# Patient Record
Sex: Female | Born: 1955
Health system: Southern US, Community
[De-identification: ages and names within clinical notes are randomized; demographics above are authoritative.]

## PROBLEM LIST (undated history)

## (undated) DIAGNOSIS — I213 ST elevation (STEMI) myocardial infarction of unspecified site: Secondary | ICD-10-CM

## (undated) DIAGNOSIS — Z720 Tobacco use: Secondary | ICD-10-CM

## (undated) DIAGNOSIS — C189 Malignant neoplasm of colon, unspecified: Secondary | ICD-10-CM

## (undated) DIAGNOSIS — J449 Chronic obstructive pulmonary disease, unspecified: Secondary | ICD-10-CM

## (undated) DIAGNOSIS — I5022 Chronic systolic (congestive) heart failure: Secondary | ICD-10-CM

## (undated) DIAGNOSIS — J9621 Acute and chronic respiratory failure with hypoxia: Secondary | ICD-10-CM

## (undated) DIAGNOSIS — I255 Ischemic cardiomyopathy: Secondary | ICD-10-CM

## (undated) DIAGNOSIS — I509 Heart failure, unspecified: Secondary | ICD-10-CM

## (undated) DIAGNOSIS — E785 Hyperlipidemia, unspecified: Secondary | ICD-10-CM

## (undated) DIAGNOSIS — I10 Essential (primary) hypertension: Secondary | ICD-10-CM

## (undated) DIAGNOSIS — J9501 Hemorrhage from tracheostomy stoma: Secondary | ICD-10-CM

## (undated) DIAGNOSIS — I2510 Atherosclerotic heart disease of native coronary artery without angina pectoris: Secondary | ICD-10-CM

## (undated) DIAGNOSIS — G629 Polyneuropathy, unspecified: Secondary | ICD-10-CM

## (undated) DIAGNOSIS — C3490 Malignant neoplasm of unspecified part of unspecified bronchus or lung: Secondary | ICD-10-CM

## (undated) HISTORY — PX: ABDOMINAL SURGERY: SHX537

## (undated) HISTORY — PX: COLOSTOMY: SHX63

---
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
## 2018-12-12 ENCOUNTER — Inpatient Hospital Stay (HOSPITAL_COMMUNITY): Payer: Medicare HMO

## 2018-12-12 ENCOUNTER — Encounter (HOSPITAL_COMMUNITY): Payer: Self-pay | Admitting: Physician Assistant

## 2018-12-12 ENCOUNTER — Ambulatory Visit (HOSPITAL_COMMUNITY): Admit: 2018-12-12 | Payer: Medicare HMO | Admitting: Interventional Cardiology

## 2018-12-12 ENCOUNTER — Emergency Department (HOSPITAL_COMMUNITY): Payer: Medicare HMO | Admitting: Anesthesiology

## 2018-12-12 ENCOUNTER — Encounter (HOSPITAL_COMMUNITY)
Admission: EM | Disposition: A | Discharge status: Home Health | Payer: Self-pay | Source: Home / Self Care | Attending: Interventional Cardiology

## 2018-12-12 ENCOUNTER — Encounter (HOSPITAL_COMMUNITY): Payer: Self-pay | Admitting: Certified Registered Nurse Anesthetist

## 2018-12-12 ENCOUNTER — Inpatient Hospital Stay (HOSPITAL_COMMUNITY)
Admission: EM | Admit: 2018-12-12 | Discharge: 2018-12-20 | DRG: 270 | Disposition: A | Discharge status: Home Health | Payer: Medicare HMO | Attending: Interventional Cardiology | Admitting: Interventional Cardiology

## 2018-12-12 ENCOUNTER — Emergency Department (HOSPITAL_COMMUNITY): Payer: Medicare HMO

## 2018-12-12 DIAGNOSIS — J810 Acute pulmonary edema: Secondary | ICD-10-CM | POA: Diagnosis not present

## 2018-12-12 DIAGNOSIS — I5041 Acute combined systolic (congestive) and diastolic (congestive) heart failure: Secondary | ICD-10-CM

## 2018-12-12 DIAGNOSIS — Y95 Nosocomial condition: Secondary | ICD-10-CM | POA: Diagnosis not present

## 2018-12-12 DIAGNOSIS — I219 Acute myocardial infarction, unspecified: Secondary | ICD-10-CM | POA: Diagnosis not present

## 2018-12-12 DIAGNOSIS — I2510 Atherosclerotic heart disease of native coronary artery without angina pectoris: Secondary | ICD-10-CM

## 2018-12-12 DIAGNOSIS — E872 Acidosis: Secondary | ICD-10-CM | POA: Diagnosis not present

## 2018-12-12 DIAGNOSIS — Z902 Acquired absence of lung [part of]: Secondary | ICD-10-CM

## 2018-12-12 DIAGNOSIS — J9621 Acute and chronic respiratory failure with hypoxia: Secondary | ICD-10-CM | POA: Diagnosis not present

## 2018-12-12 DIAGNOSIS — E785 Hyperlipidemia, unspecified: Secondary | ICD-10-CM | POA: Diagnosis present

## 2018-12-12 DIAGNOSIS — I2129 ST elevation (STEMI) myocardial infarction involving other sites: Secondary | ICD-10-CM | POA: Diagnosis present

## 2018-12-12 DIAGNOSIS — Z85118 Personal history of other malignant neoplasm of bronchus and lung: Secondary | ICD-10-CM

## 2018-12-12 DIAGNOSIS — I472 Ventricular tachycardia: Secondary | ICD-10-CM | POA: Diagnosis not present

## 2018-12-12 DIAGNOSIS — I25110 Atherosclerotic heart disease of native coronary artery with unstable angina pectoris: Secondary | ICD-10-CM | POA: Diagnosis present

## 2018-12-12 DIAGNOSIS — Z20828 Contact with and (suspected) exposure to other viral communicable diseases: Secondary | ICD-10-CM | POA: Diagnosis present

## 2018-12-12 DIAGNOSIS — J9622 Acute and chronic respiratory failure with hypercapnia: Secondary | ICD-10-CM | POA: Diagnosis not present

## 2018-12-12 DIAGNOSIS — E782 Mixed hyperlipidemia: Secondary | ICD-10-CM | POA: Diagnosis not present

## 2018-12-12 DIAGNOSIS — Z85048 Personal history of other malignant neoplasm of rectum, rectosigmoid junction, and anus: Secondary | ICD-10-CM | POA: Diagnosis not present

## 2018-12-12 DIAGNOSIS — Z720 Tobacco use: Secondary | ICD-10-CM

## 2018-12-12 DIAGNOSIS — R7981 Abnormal blood-gas level: Secondary | ICD-10-CM

## 2018-12-12 DIAGNOSIS — Z79899 Other long term (current) drug therapy: Secondary | ICD-10-CM

## 2018-12-12 DIAGNOSIS — R0689 Other abnormalities of breathing: Secondary | ICD-10-CM | POA: Diagnosis not present

## 2018-12-12 DIAGNOSIS — I214 Non-ST elevation (NSTEMI) myocardial infarction: Secondary | ICD-10-CM | POA: Diagnosis not present

## 2018-12-12 DIAGNOSIS — F17210 Nicotine dependence, cigarettes, uncomplicated: Secondary | ICD-10-CM | POA: Diagnosis present

## 2018-12-12 DIAGNOSIS — I213 ST elevation (STEMI) myocardial infarction of unspecified site: Secondary | ICD-10-CM | POA: Diagnosis present

## 2018-12-12 DIAGNOSIS — E876 Hypokalemia: Secondary | ICD-10-CM | POA: Diagnosis present

## 2018-12-12 DIAGNOSIS — J690 Pneumonitis due to inhalation of food and vomit: Secondary | ICD-10-CM | POA: Diagnosis present

## 2018-12-12 DIAGNOSIS — Z955 Presence of coronary angioplasty implant and graft: Secondary | ICD-10-CM

## 2018-12-12 DIAGNOSIS — Z4659 Encounter for fitting and adjustment of other gastrointestinal appliance and device: Secondary | ICD-10-CM

## 2018-12-12 DIAGNOSIS — J9600 Acute respiratory failure, unspecified whether with hypoxia or hypercapnia: Secondary | ICD-10-CM

## 2018-12-12 DIAGNOSIS — I10 Essential (primary) hypertension: Secondary | ICD-10-CM | POA: Diagnosis not present

## 2018-12-12 DIAGNOSIS — R0902 Hypoxemia: Secondary | ICD-10-CM

## 2018-12-12 DIAGNOSIS — Z933 Colostomy status: Secondary | ICD-10-CM | POA: Diagnosis not present

## 2018-12-12 DIAGNOSIS — G4700 Insomnia, unspecified: Secondary | ICD-10-CM | POA: Diagnosis not present

## 2018-12-12 DIAGNOSIS — Z9221 Personal history of antineoplastic chemotherapy: Secondary | ICD-10-CM | POA: Diagnosis not present

## 2018-12-12 DIAGNOSIS — J441 Chronic obstructive pulmonary disease with (acute) exacerbation: Secondary | ICD-10-CM

## 2018-12-12 DIAGNOSIS — G92 Toxic encephalopathy: Secondary | ICD-10-CM | POA: Diagnosis not present

## 2018-12-12 DIAGNOSIS — R339 Retention of urine, unspecified: Secondary | ICD-10-CM | POA: Diagnosis not present

## 2018-12-12 DIAGNOSIS — I110 Hypertensive heart disease with heart failure: Secondary | ICD-10-CM | POA: Diagnosis present

## 2018-12-12 DIAGNOSIS — I2582 Chronic total occlusion of coronary artery: Secondary | ICD-10-CM | POA: Diagnosis present

## 2018-12-12 DIAGNOSIS — Z9911 Dependence on respirator [ventilator] status: Secondary | ICD-10-CM | POA: Diagnosis not present

## 2018-12-12 DIAGNOSIS — I200 Unstable angina: Secondary | ICD-10-CM

## 2018-12-12 DIAGNOSIS — Z452 Encounter for adjustment and management of vascular access device: Secondary | ICD-10-CM

## 2018-12-12 DIAGNOSIS — J189 Pneumonia, unspecified organism: Secondary | ICD-10-CM

## 2018-12-12 DIAGNOSIS — R570 Cardiogenic shock: Secondary | ICD-10-CM | POA: Diagnosis present

## 2018-12-12 DIAGNOSIS — R0602 Shortness of breath: Secondary | ICD-10-CM

## 2018-12-12 DIAGNOSIS — J9601 Acute respiratory failure with hypoxia: Secondary | ICD-10-CM | POA: Diagnosis not present

## 2018-12-12 DIAGNOSIS — R4182 Altered mental status, unspecified: Secondary | ICD-10-CM

## 2018-12-12 DIAGNOSIS — I2121 ST elevation (STEMI) myocardial infarction involving left circumflex coronary artery: Secondary | ICD-10-CM | POA: Diagnosis not present

## 2018-12-12 DIAGNOSIS — Z978 Presence of other specified devices: Secondary | ICD-10-CM

## 2018-12-12 HISTORY — DX: ST elevation (STEMI) myocardial infarction of unspecified site: I21.3

## 2018-12-12 HISTORY — PX: LEFT HEART CATH AND CORONARY ANGIOGRAPHY: CATH118249

## 2018-12-12 HISTORY — DX: Ischemic cardiomyopathy: I25.5

## 2018-12-12 HISTORY — DX: Chronic obstructive pulmonary disease, unspecified: J44.9

## 2018-12-12 HISTORY — DX: Polyneuropathy, unspecified: G62.9

## 2018-12-12 HISTORY — DX: Malignant neoplasm of unspecified part of unspecified bronchus or lung: C34.90

## 2018-12-12 HISTORY — DX: Essential (primary) hypertension: I10

## 2018-12-12 HISTORY — DX: Hyperlipidemia, unspecified: E78.5

## 2018-12-12 HISTORY — PX: CORONARY/GRAFT ACUTE MI REVASCULARIZATION: CATH118305

## 2018-12-12 HISTORY — DX: Atherosclerotic heart disease of native coronary artery without angina pectoris: I25.10

## 2018-12-12 HISTORY — DX: Tobacco use: Z72.0

## 2018-12-12 HISTORY — DX: Malignant neoplasm of colon, unspecified: C18.9

## 2018-12-12 LAB — POCT I-STAT 7, (LYTES, BLD GAS, ICA,H+H)
Acid-base deficit: 1 mmol/L (ref 0.0–2.0)
Acid-base deficit: 6 mmol/L — ABNORMAL HIGH (ref 0.0–2.0)
Acid-base deficit: 4 mmol/L — ABNORMAL HIGH (ref 0.0–2.0)
Acid-base deficit: 5 mmol/L — ABNORMAL HIGH (ref 0.0–2.0)
Acid-base deficit: 3 mmol/L — ABNORMAL HIGH (ref 0.0–2.0)
Acid-base deficit: 3 mmol/L — ABNORMAL HIGH (ref 0.0–2.0)
Bicarbonate: 27.7 mmol/L (ref 20.0–28.0)
Bicarbonate: 26.5 mmol/L (ref 20.0–28.0)
Bicarbonate: 26.6 mmol/L (ref 20.0–28.0)
Bicarbonate: 27.1 mmol/L (ref 20.0–28.0)
Bicarbonate: 24.9 mmol/L (ref 20.0–28.0)
Bicarbonate: 24.8 mmol/L (ref 20.0–28.0)
Calcium, Ion: 1.25 mmol/L (ref 1.15–1.40)
Calcium, Ion: 1.19 mmol/L (ref 1.15–1.40)
Calcium, Ion: 1.25 mmol/L (ref 1.15–1.40)
Calcium, Ion: 1.26 mmol/L (ref 1.15–1.40)
Calcium, Ion: 1.22 mmol/L (ref 1.15–1.40)
Calcium, Ion: 1.23 mmol/L (ref 1.15–1.40)
HCT: 51 % — ABNORMAL HIGH (ref 36.0–46.0)
HCT: 52 % — ABNORMAL HIGH (ref 36.0–46.0)
HCT: 51 % — ABNORMAL HIGH (ref 36.0–46.0)
HCT: 51 % — ABNORMAL HIGH (ref 36.0–46.0)
HCT: 54 % — ABNORMAL HIGH (ref 36.0–46.0)
HCT: 53 % — ABNORMAL HIGH (ref 36.0–46.0)
Hemoglobin: 17.3 g/dL — ABNORMAL HIGH (ref 12.0–15.0)
Hemoglobin: 17.7 g/dL — ABNORMAL HIGH (ref 12.0–15.0)
Hemoglobin: 17.3 g/dL — ABNORMAL HIGH (ref 12.0–15.0)
Hemoglobin: 17.3 g/dL — ABNORMAL HIGH (ref 12.0–15.0)
Hemoglobin: 18.4 g/dL — ABNORMAL HIGH (ref 12.0–15.0)
Hemoglobin: 18 g/dL — ABNORMAL HIGH (ref 12.0–15.0)
O2 Saturation: 41 %
O2 Saturation: 99 %
O2 Saturation: 68 %
O2 Saturation: 69 %
O2 Saturation: 100 %
O2 Saturation: 100 %
Patient temperature: 35.8
Patient temperature: 36.1
Potassium: 3.5 mmol/L (ref 3.5–5.1)
Potassium: 3.7 mmol/L (ref 3.5–5.1)
Potassium: 3.6 mmol/L (ref 3.5–5.1)
Potassium: 3.6 mmol/L (ref 3.5–5.1)
Potassium: 3.4 mmol/L — ABNORMAL LOW (ref 3.5–5.1)
Potassium: 3.6 mmol/L (ref 3.5–5.1)
Sodium: 141 mmol/L (ref 135–145)
Sodium: 142 mmol/L (ref 135–145)
Sodium: 140 mmol/L (ref 135–145)
Sodium: 141 mmol/L (ref 135–145)
Sodium: 141 mmol/L (ref 135–145)
Sodium: 141 mmol/L (ref 135–145)
TCO2: 30 mmol/L (ref 22–32)
TCO2: 29 mmol/L (ref 22–32)
TCO2: 29 mmol/L (ref 22–32)
TCO2: 29 mmol/L (ref 22–32)
TCO2: 27 mmol/L (ref 22–32)
TCO2: 26 mmol/L (ref 22–32)
pCO2 arterial: 60.2 mmHg — ABNORMAL HIGH (ref 32.0–48.0)
pCO2 arterial: 81.3 mmHg (ref 32.0–48.0)
pCO2 arterial: 78.2 mmHg (ref 32.0–48.0)
pCO2 arterial: 78.3 mmHg (ref 32.0–48.0)
pCO2 arterial: 52.3 mmHg — ABNORMAL HIGH (ref 32.0–48.0)
pCO2 arterial: 51 mmHg — ABNORMAL HIGH (ref 32.0–48.0)
pH, Arterial: 7.272 — ABNORMAL LOW (ref 7.350–7.450)
pH, Arterial: 7.122 — CL (ref 7.350–7.450)
pH, Arterial: 7.14 — CL (ref 7.350–7.450)
pH, Arterial: 7.148 — CL (ref 7.350–7.450)
pH, Arterial: 7.28 — ABNORMAL LOW (ref 7.350–7.450)
pH, Arterial: 7.29 — ABNORMAL LOW (ref 7.350–7.450)
pO2, Arterial: 27 mmHg — CL (ref 83.0–108.0)
pO2, Arterial: 174 mmHg — ABNORMAL HIGH (ref 83.0–108.0)
pO2, Arterial: 47 mmHg — ABNORMAL LOW (ref 83.0–108.0)
pO2, Arterial: 48 mmHg — ABNORMAL LOW (ref 83.0–108.0)
pO2, Arterial: 212 mmHg — ABNORMAL HIGH (ref 83.0–108.0)
pO2, Arterial: 236 mmHg — ABNORMAL HIGH (ref 83.0–108.0)

## 2018-12-12 LAB — LIPID PANEL
Cholesterol: 255 mg/dL — ABNORMAL HIGH (ref 0–200)
HDL: 42 mg/dL (ref >40)
LDL Cholesterol: 196 mg/dL — ABNORMAL HIGH (ref 0–99)
Total CHOL/HDL Ratio: 6.1 RATIO
Triglycerides: 86 mg/dL (ref <150)
VLDL: 17 mg/dL (ref 0–40)

## 2018-12-12 LAB — CBC WITH DIFFERENTIAL/PLATELET
Abs Immature Granulocytes: 0.05 10*3/uL (ref 0.00–0.07)
Basophils Absolute: 0 10*3/uL (ref 0.0–0.1)
Basophils Relative: 0 %
Eosinophils Absolute: 0.1 10*3/uL (ref 0.0–0.5)
Eosinophils Relative: 1 %
HCT: 53.9 % — ABNORMAL HIGH (ref 36.0–46.0)
Hemoglobin: 16.8 g/dL — ABNORMAL HIGH (ref 12.0–15.0)
Immature Granulocytes: 0 %
Lymphocytes Relative: 10 %
Lymphs Abs: 1.2 10*3/uL (ref 0.7–4.0)
MCH: 28.2 pg (ref 26.0–34.0)
MCHC: 31.2 g/dL (ref 30.0–36.0)
MCV: 90.4 fL (ref 80.0–100.0)
Monocytes Absolute: 0.7 10*3/uL (ref 0.1–1.0)
Monocytes Relative: 5 %
Neutro Abs: 10.7 10*3/uL — ABNORMAL HIGH (ref 1.7–7.7)
Neutrophils Relative %: 84 %
Platelets: 327 10*3/uL (ref 150–400)
RBC: 5.96 MIL/uL — ABNORMAL HIGH (ref 3.87–5.11)
RDW: 14.1 % (ref 11.5–15.5)
WBC: 12.8 10*3/uL — ABNORMAL HIGH (ref 4.0–10.5)
nRBC: 0 % (ref 0.0–0.2)

## 2018-12-12 LAB — CBC
HCT: 46.7 % — ABNORMAL HIGH (ref 36.0–46.0)
Hemoglobin: 15.1 g/dL — ABNORMAL HIGH (ref 12.0–15.0)
MCH: 28.3 pg (ref 26.0–34.0)
MCHC: 32.3 g/dL (ref 30.0–36.0)
MCV: 87.6 fL (ref 80.0–100.0)
Platelets: 319 10*3/uL (ref 150–400)
RBC: 5.33 MIL/uL — ABNORMAL HIGH (ref 3.87–5.11)
RDW: 14.1 % (ref 11.5–15.5)
WBC: 15 10*3/uL — ABNORMAL HIGH (ref 4.0–10.5)
nRBC: 0 % (ref 0.0–0.2)

## 2018-12-12 LAB — ECHOCARDIOGRAM COMPLETE
Height: 68 in
Weight: 3033.53 oz

## 2018-12-12 LAB — MRSA PCR SCREENING: MRSA by PCR: NEGATIVE

## 2018-12-12 LAB — POCT I-STAT, CHEM 8
BUN: 22 mg/dL (ref 8–23)
Calcium, Ion: 1.25 mmol/L (ref 1.15–1.40)
Chloride: 100 mmol/L (ref 98–111)
Creatinine, Ser: 0.9 mg/dL (ref 0.44–1.00)
Glucose, Bld: 124 mg/dL — ABNORMAL HIGH (ref 70–99)
HCT: 46 % (ref 36.0–46.0)
Hemoglobin: 15.6 g/dL — ABNORMAL HIGH (ref 12.0–15.0)
Potassium: 3.4 mmol/L — ABNORMAL LOW (ref 3.5–5.1)
Sodium: 144 mmol/L (ref 135–145)
TCO2: 25 mmol/L (ref 22–32)

## 2018-12-12 LAB — PROTIME-INR
INR: 1 (ref 0.8–1.2)
Prothrombin Time: 13.3 seconds (ref 11.4–15.2)

## 2018-12-12 LAB — APTT: aPTT: 30 seconds (ref 24–36)

## 2018-12-12 LAB — HEMOGLOBIN A1C
Hgb A1c MFr Bld: 5.4 % (ref 4.8–5.6)
Mean Plasma Glucose: 108.28 mg/dL

## 2018-12-12 LAB — POCT ACTIVATED CLOTTING TIME
Activated Clotting Time: 235 seconds
Activated Clotting Time: 246 seconds
Activated Clotting Time: 296 seconds
Activated Clotting Time: 274 seconds
Activated Clotting Time: 125 seconds

## 2018-12-12 LAB — SARS CORONAVIRUS 2 BY RT PCR (HOSPITAL ORDER, PERFORMED IN ~~LOC~~ HOSPITAL LAB): SARS Coronavirus 2: NEGATIVE

## 2018-12-12 LAB — HIV ANTIBODY (ROUTINE TESTING W REFLEX): HIV Screen 4th Generation wRfx: NONREACTIVE

## 2018-12-12 LAB — GLUCOSE, CAPILLARY: Glucose-Capillary: 119 mg/dL — ABNORMAL HIGH (ref 70–99)

## 2018-12-12 LAB — BRAIN NATRIURETIC PEPTIDE: B Natriuretic Peptide: 248 pg/mL — ABNORMAL HIGH (ref 0.0–100.0)

## 2018-12-12 LAB — TROPONIN I (HIGH SENSITIVITY)
Troponin I (High Sensitivity): 65 ng/L — ABNORMAL HIGH (ref <18)
Troponin I (High Sensitivity): >27000 ng/L (ref <18)

## 2018-12-12 LAB — COOXEMETRY PANEL
Carboxyhemoglobin: 0.9 % (ref 0.5–1.5)
Methemoglobin: 0.7 % (ref 0.0–1.5)
O2 Saturation: 70.1 %
Total hemoglobin: 16.9 g/dL — ABNORMAL HIGH (ref 12.0–16.0)

## 2018-12-12 SURGERY — LEFT HEART CATH AND CORONARY ANGIOGRAPHY
Anesthesia: LOCAL

## 2018-12-12 MED ORDER — VERAPAMIL HCL 2.5 MG/ML IV SOLN
INTRAVENOUS | Status: AC
  Filled 2018-12-12: qty 2

## 2018-12-12 MED ORDER — MIDAZOLAM HCL 2 MG/2ML IJ SOLN
2.0000 mg | INTRAMUSCULAR | Status: DC | PRN
  Administered 2018-12-13: 02:00:00 2 mg via INTRAVENOUS
  Filled 2018-12-12: qty 2

## 2018-12-12 MED ORDER — TICAGRELOR 90 MG PO TABS
180.0000 mg | ORAL_TABLET | Freq: Once | ORAL | Status: AC
  Administered 2018-12-12: 21:00:00 180 mg via ORAL
  Filled 2018-12-12: qty 2

## 2018-12-12 MED ORDER — MIDAZOLAM 50MG/50ML (1MG/ML) PREMIX INFUSION
0.0000 mg/h | INTRAVENOUS | Status: DC
  Filled 2018-12-12 (×2): qty 50

## 2018-12-12 MED ORDER — HEPARIN SODIUM (PORCINE) 5000 UNIT/ML IJ SOLN
5000.0000 [IU] | Freq: Three times a day (TID) | INTRAMUSCULAR | Status: DC
  Administered 2018-12-13 – 2018-12-19 (×20): 5000 [IU] via SUBCUTANEOUS
  Filled 2018-12-12 (×20): qty 1

## 2018-12-12 MED ORDER — FENTANYL BOLUS VIA INFUSION
50.0000 ug | INTRAVENOUS | Status: DC | PRN
  Administered 2018-12-12 – 2018-12-13 (×2): 50 ug via INTRAVENOUS
  Filled 2018-12-12: qty 50

## 2018-12-12 MED ORDER — ACETAMINOPHEN 325 MG PO TABS: 650.0000 mg | ORAL_TABLET | ORAL | Status: DC | PRN

## 2018-12-12 MED ORDER — TIROFIBAN HCL IN NACL 5-0.9 MG/100ML-% IV SOLN
INTRAVENOUS | Status: AC
  Filled 2018-12-12: qty 100

## 2018-12-12 MED ORDER — FENTANYL 2500MCG IN NS 250ML (10MCG/ML) PREMIX INFUSION
50.0000 ug/h | INTRAVENOUS | Status: DC
  Filled 2018-12-12 (×2): qty 250

## 2018-12-12 MED ORDER — TICAGRELOR 90 MG PO TABS: 180.0000 mg | ORAL_TABLET | Freq: Once | ORAL | Status: DC

## 2018-12-12 MED ORDER — ZOLPIDEM TARTRATE 5 MG PO TABS
5.0000 mg | ORAL_TABLET | Freq: Every evening | ORAL | Status: DC | PRN
  Administered 2018-12-14: 21:00:00 5 mg via ORAL
  Filled 2018-12-12: qty 1

## 2018-12-12 MED ORDER — IOHEXOL 350 MG/ML SOLN
INTRAVENOUS | Status: DC | PRN
  Administered 2018-12-12: 09:00:00 105 mL

## 2018-12-12 MED ORDER — VERAPAMIL HCL 2.5 MG/ML IV SOLN
INTRAVENOUS | Status: DC | PRN
  Administered 2018-12-12: 07:00:00 10 mL via INTRA_ARTERIAL

## 2018-12-12 MED ORDER — BUDESONIDE 0.5 MG/2ML IN SUSP
0.5000 mg | Freq: Two times a day (BID) | RESPIRATORY_TRACT | Status: DC
  Administered 2018-12-12 – 2018-12-17 (×10): 0.5 mg via RESPIRATORY_TRACT
  Filled 2018-12-12 (×12): qty 2

## 2018-12-12 MED ORDER — SENNOSIDES 8.8 MG/5ML PO SYRP
5.0000 mL | ORAL_SOLUTION | Freq: Two times a day (BID) | ORAL | Status: DC | PRN
  Filled 2018-12-12: qty 5

## 2018-12-12 MED ORDER — MIDAZOLAM HCL 2 MG/2ML IJ SOLN
INTRAMUSCULAR | Status: AC
  Filled 2018-12-12: qty 2

## 2018-12-12 MED ORDER — PERFLUTREN LIPID MICROSPHERE
INTRAVENOUS | Status: AC
  Administered 2018-12-12: 2 mL via INTRAVENOUS
  Filled 2018-12-12: qty 10

## 2018-12-12 MED ORDER — MIDAZOLAM HCL 5 MG/5ML IJ SOLN
INTRAMUSCULAR | Status: DC | PRN
  Administered 2018-12-12: 1.5 mg via INTRAVENOUS

## 2018-12-12 MED ORDER — ASPIRIN 81 MG PO CHEW
81.0000 mg | CHEWABLE_TABLET | Freq: Every day | ORAL | Status: DC
  Administered 2018-12-13 – 2018-12-18 (×6): 81 mg
  Filled 2018-12-12 (×6): qty 1

## 2018-12-12 MED ORDER — MIDAZOLAM BOLUS VIA INFUSION
1.0000 mg | INTRAVENOUS | Status: DC | PRN
  Filled 2018-12-12: qty 2

## 2018-12-12 MED ORDER — BISACODYL 10 MG RE SUPP
10.0000 mg | Freq: Every day | RECTAL | Status: DC | PRN
  Filled 2018-12-12: qty 1

## 2018-12-12 MED ORDER — FUROSEMIDE 10 MG/ML IJ SOLN
INTRAMUSCULAR | Status: AC
  Filled 2018-12-12: qty 8

## 2018-12-12 MED ORDER — TICAGRELOR 90 MG PO TABS
90.0000 mg | ORAL_TABLET | Freq: Two times a day (BID) | ORAL | Status: DC
  Administered 2018-12-13 – 2018-12-14 (×3): 90 mg
  Filled 2018-12-12 (×4): qty 1

## 2018-12-12 MED ORDER — NITROGLYCERIN 1 MG/10 ML FOR IR/CATH LAB
INTRA_ARTERIAL | Status: AC
  Filled 2018-12-12: qty 10

## 2018-12-12 MED ORDER — ACETAMINOPHEN 325 MG PO TABS
650.0000 mg | ORAL_TABLET | ORAL | Status: DC | PRN
  Administered 2018-12-12 – 2018-12-15 (×9): 650 mg via ORAL
  Filled 2018-12-12 (×10): qty 2

## 2018-12-12 MED ORDER — HEPARIN SODIUM (PORCINE) 1000 UNIT/ML IJ SOLN
INTRAMUSCULAR | Status: DC | PRN
  Administered 2018-12-12: 2000 [IU] via INTRAVENOUS
  Administered 2018-12-12: 5000 [IU] via INTRAVENOUS
  Administered 2018-12-12: 2000 [IU] via INTRAVENOUS
  Administered 2018-12-12: 5000 [IU] via INTRAVENOUS
  Administered 2018-12-12: 3000 [IU] via INTRAVENOUS

## 2018-12-12 MED ORDER — SODIUM CHLORIDE 0.9 % IV SOLN
INTRAVENOUS | Status: AC
  Administered 2018-12-12: 10:00:00 via INTRAVENOUS

## 2018-12-12 MED ORDER — ONDANSETRON HCL 4 MG/2ML IJ SOLN: 4.0000 mg | Freq: Four times a day (QID) | INTRAMUSCULAR | Status: DC | PRN

## 2018-12-12 MED ORDER — FENTANYL 2500MCG IN NS 250ML (10MCG/ML) PREMIX INFUSION
50.0000 ug/h | INTRAVENOUS | Status: DC
  Administered 2018-12-12: 11:00:00 50 ug/h via INTRAVENOUS
  Administered 2018-12-13: 200 ug/h via INTRAVENOUS
  Filled 2018-12-12: qty 250

## 2018-12-12 MED ORDER — FENTANYL 2500MCG IN NS 250ML (10MCG/ML) PREMIX INFUSION: 50.0000 ug/h | INTRAVENOUS | Status: DC

## 2018-12-12 MED ORDER — HEPARIN (PORCINE) IN NACL 2000-0.9 UNIT/L-% IV SOLN: INTRAVENOUS | Status: DC | PRN

## 2018-12-12 MED ORDER — FENTANYL BOLUS VIA INFUSION
50.0000 ug | INTRAVENOUS | Status: DC | PRN
  Filled 2018-12-12: qty 50

## 2018-12-12 MED ORDER — ENOXAPARIN SODIUM 40 MG/0.4ML ~~LOC~~ SOLN: 40.0000 mg | SUBCUTANEOUS | Status: DC

## 2018-12-12 MED ORDER — ATORVASTATIN CALCIUM 80 MG PO TABS
80.0000 mg | ORAL_TABLET | Freq: Every day | ORAL | Status: DC
  Filled 2018-12-12 (×2): qty 1

## 2018-12-12 MED ORDER — ONDANSETRON HCL 4 MG/2ML IJ SOLN
4.0000 mg | Freq: Four times a day (QID) | INTRAMUSCULAR | Status: DC | PRN
  Administered 2018-12-16: 17:00:00 4 mg via INTRAVENOUS
  Filled 2018-12-12: qty 2

## 2018-12-12 MED ORDER — CHLORHEXIDINE GLUCONATE CLOTH 2 % EX PADS
6.0000 | MEDICATED_PAD | Freq: Every day | CUTANEOUS | Status: DC
  Administered 2018-12-12 – 2018-12-18 (×7): 6 via TOPICAL

## 2018-12-12 MED ORDER — SODIUM CHLORIDE 0.9 % IV SOLN
INTRAVENOUS | Status: DC
  Administered 2018-12-15: 20:00:00 via INTRAVENOUS

## 2018-12-12 MED ORDER — FENTANYL CITRATE (PF) 100 MCG/2ML IJ SOLN
INTRAMUSCULAR | Status: DC | PRN
  Administered 2018-12-12: 25 ug via INTRAVENOUS

## 2018-12-12 MED ORDER — FENTANYL CITRATE (PF) 100 MCG/2ML IJ SOLN: 50.0000 ug | Freq: Once | INTRAMUSCULAR | Status: DC

## 2018-12-12 MED ORDER — EPINEPHRINE PF 1 MG/ML IJ SOLN
INTRAMUSCULAR | Status: AC
  Filled 2018-12-12: qty 1

## 2018-12-12 MED ORDER — NOREPINEPHRINE BITARTRATE 1 MG/ML IV SOLN
INTRAVENOUS | Status: AC | PRN
  Administered 2018-12-12: 09:00:00 10 ug/kg/min via INTRAVENOUS

## 2018-12-12 MED ORDER — SODIUM CHLORIDE 0.9 % IV SOLN
250.0000 mL | INTRAVENOUS | Status: DC | PRN
  Administered 2018-12-12: 22:00:00 500 mL via INTRAVENOUS

## 2018-12-12 MED ORDER — HEPARIN SODIUM (PORCINE) 5000 UNIT/ML IJ SOLN: 4000.0000 [IU] | Freq: Once | INTRAMUSCULAR | Status: DC

## 2018-12-12 MED ORDER — ROSUVASTATIN CALCIUM 20 MG PO TABS: 20.0000 mg | ORAL_TABLET | Freq: Every day | ORAL | Status: DC

## 2018-12-12 MED ORDER — MIDAZOLAM HCL 2 MG/2ML IJ SOLN: 2.0000 mg | INTRAMUSCULAR | Status: DC | PRN

## 2018-12-12 MED ORDER — HEPARIN SODIUM (PORCINE) 5000 UNIT/ML IJ SOLN: 60.0000 [IU]/kg | Freq: Once | INTRAMUSCULAR | Status: DC

## 2018-12-12 MED ORDER — LIDOCAINE HCL (PF) 1 % IJ SOLN
INTRAMUSCULAR | Status: DC | PRN
  Administered 2018-12-12: 2 mL via SUBCUTANEOUS
  Administered 2018-12-12: 10 mL via SUBCUTANEOUS

## 2018-12-12 MED ORDER — ALPRAZOLAM 0.25 MG PO TABS
0.2500 mg | ORAL_TABLET | Freq: Two times a day (BID) | ORAL | Status: DC | PRN
  Administered 2018-12-13 – 2018-12-15 (×5): 0.25 mg via ORAL
  Filled 2018-12-12 (×5): qty 1

## 2018-12-12 MED ORDER — HEPARIN (PORCINE) IN NACL 1000-0.9 UT/500ML-% IV SOLN
INTRAVENOUS | Status: AC
  Filled 2018-12-12: qty 500

## 2018-12-12 MED ORDER — CHLORHEXIDINE GLUCONATE 0.12% ORAL RINSE (MEDLINE KIT)
15.0000 mL | Freq: Two times a day (BID) | OROMUCOSAL | Status: DC
  Administered 2018-12-12 – 2018-12-13 (×2): 15 mL via OROMUCOSAL

## 2018-12-12 MED ORDER — PERFLUTREN LIPID MICROSPHERE
1.0000 mL | INTRAVENOUS | Status: AC | PRN
  Administered 2018-12-12: 14:00:00 2 mL via INTRAVENOUS
  Filled 2018-12-12: qty 10

## 2018-12-12 MED ORDER — TIROFIBAN HCL IV 12.5 MG/250 ML
INTRAVENOUS | Status: AC | PRN
  Administered 2018-12-12: 0.075 ug/kg/min via INTRAVENOUS

## 2018-12-12 MED ORDER — HEPARIN (PORCINE) IN NACL 1000-0.9 UT/500ML-% IV SOLN
INTRAVENOUS | Status: AC
  Filled 2018-12-12: qty 1000

## 2018-12-12 MED ORDER — LIDOCAINE HCL (PF) 1 % IJ SOLN
INTRAMUSCULAR | Status: AC
  Filled 2018-12-12: qty 30

## 2018-12-12 MED ORDER — ASPIRIN 81 MG PO CHEW: 81.0000 mg | CHEWABLE_TABLET | Freq: Every day | ORAL | Status: DC

## 2018-12-12 MED ORDER — SODIUM CHLORIDE 0.9% FLUSH
3.0000 mL | Freq: Two times a day (BID) | INTRAVENOUS | Status: DC
  Administered 2018-12-12 – 2018-12-18 (×10): 3 mL via INTRAVENOUS

## 2018-12-12 MED ORDER — TICAGRELOR 90 MG PO TABS: 90.0000 mg | ORAL_TABLET | Freq: Two times a day (BID) | ORAL | Status: DC

## 2018-12-12 MED ORDER — FUROSEMIDE 10 MG/ML IJ SOLN
INTRAMUSCULAR | Status: DC | PRN
  Administered 2018-12-12: 40 mg via INTRAVENOUS

## 2018-12-12 MED ORDER — FENTANYL CITRATE (PF) 100 MCG/2ML IJ SOLN
50.0000 ug | Freq: Once | INTRAMUSCULAR | Status: AC
  Administered 2018-12-12: 11:00:00 50 ug via INTRAVENOUS

## 2018-12-12 MED ORDER — BUDESONIDE 0.5 MG/2ML IN SUSP: 0.5000 mg | Freq: Two times a day (BID) | RESPIRATORY_TRACT | Status: DC

## 2018-12-12 MED ORDER — METHYLPREDNISOLONE SODIUM SUCC 125 MG IJ SOLR
60.0000 mg | Freq: Four times a day (QID) | INTRAMUSCULAR | Status: DC
  Administered 2018-12-12 – 2018-12-13 (×4): 60 mg via INTRAVENOUS
  Filled 2018-12-12 (×5): qty 2

## 2018-12-12 MED ORDER — ROCURONIUM BROMIDE 100 MG/10ML IV SOLN
INTRAVENOUS | Status: DC | PRN
  Administered 2018-12-12: 100 mg via INTRAVENOUS

## 2018-12-12 MED ORDER — HEPARIN SODIUM (PORCINE) 1000 UNIT/ML IJ SOLN
INTRAMUSCULAR | Status: AC
  Filled 2018-12-12: qty 1

## 2018-12-12 MED ORDER — PANTOPRAZOLE SODIUM 40 MG IV SOLR
40.0000 mg | Freq: Every day | INTRAVENOUS | Status: DC
  Administered 2018-12-12 – 2018-12-14 (×3): 40 mg via INTRAVENOUS
  Filled 2018-12-12 (×3): qty 40

## 2018-12-12 MED ORDER — NOREPINEPHRINE 4 MG/250ML-% IV SOLN
INTRAVENOUS | Status: AC
  Filled 2018-12-12: qty 250

## 2018-12-12 MED ORDER — TIROFIBAN (AGGRASTAT) BOLUS VIA INFUSION
INTRAVENOUS | Status: DC | PRN
  Administered 2018-12-12: 08:00:00 2150 ug via INTRAVENOUS

## 2018-12-12 MED ORDER — TIROFIBAN HCL IN NACL 5-0.9 MG/100ML-% IV SOLN
0.1500 ug/kg/min | INTRAVENOUS | Status: DC
  Administered 2018-12-12 – 2018-12-13 (×3): 0.15 ug/kg/min via INTRAVENOUS
  Filled 2018-12-12 (×2): qty 100

## 2018-12-12 MED ORDER — LABETALOL HCL 5 MG/ML IV SOLN: 10.0000 mg | INTRAVENOUS | Status: DC | PRN

## 2018-12-12 MED ORDER — NITROGLYCERIN 0.4 MG SL SUBL: 0.4000 mg | SUBLINGUAL_TABLET | SUBLINGUAL | Status: DC | PRN

## 2018-12-12 MED ORDER — FENTANYL CITRATE (PF) 100 MCG/2ML IJ SOLN
INTRAMUSCULAR | Status: AC
  Filled 2018-12-12: qty 2

## 2018-12-12 MED ORDER — IPRATROPIUM-ALBUTEROL 0.5-2.5 (3) MG/3ML IN SOLN
3.0000 mL | Freq: Four times a day (QID) | RESPIRATORY_TRACT | Status: DC
  Administered 2018-12-12 – 2018-12-13 (×5): 3 mL via RESPIRATORY_TRACT
  Filled 2018-12-12 (×5): qty 3

## 2018-12-12 MED ORDER — SODIUM CHLORIDE 0.9% FLUSH
3.0000 mL | INTRAVENOUS | Status: DC | PRN
  Administered 2018-12-14 – 2018-12-16 (×2): 3 mL via INTRAVENOUS
  Filled 2018-12-12 (×2): qty 3

## 2018-12-12 MED ORDER — HYDRALAZINE HCL 20 MG/ML IJ SOLN: 10.0000 mg | INTRAMUSCULAR | Status: DC | PRN

## 2018-12-12 MED ORDER — ORAL CARE MOUTH RINSE
15.0000 mL | OROMUCOSAL | Status: DC
  Administered 2018-12-12 – 2018-12-13 (×5): 15 mL via OROMUCOSAL

## 2018-12-12 MED ORDER — HEPARIN (PORCINE) IN NACL 1000-0.9 UT/500ML-% IV SOLN
INTRAVENOUS | Status: DC | PRN
  Administered 2018-12-12 (×3): 500 mL

## 2018-12-12 MED ORDER — HEPARIN (PORCINE) 25000 UT/250ML-% IV SOLN: 800.0000 [IU]/h | INTRAVENOUS | Status: DC

## 2018-12-12 MED ORDER — MIDAZOLAM HCL 2 MG/2ML IJ SOLN
INTRAMUSCULAR | Status: DC | PRN
  Administered 2018-12-12: 0.5 mg via INTRAVENOUS

## 2018-12-12 SURGICAL SUPPLY — 25 items
BALLN IABP SENSA PLUS 8F 50CC (BALLOONS) ×2
BALLN SAPPHIRE 2.5X12 (BALLOONS) ×2
BALLN SAPPHIRE ~~LOC~~ 3.0X15 (BALLOONS) ×1 IMPLANT
BALLOON IABP SENS PLUS 8F 50CC (BALLOONS) IMPLANT
BALLOON SAPPHIRE 2.5X12 (BALLOONS) IMPLANT
CATH 5FR JL3.5 JR4 ANG PIG MP (CATHETERS) ×1 IMPLANT
CATH LAUNCHER 6FR EBU 3 (CATHETERS) ×1 IMPLANT
CATH SWAN GANZ 7F STRAIGHT (CATHETERS) ×1 IMPLANT
DEVICE RAD COMP TR BAND LRG (VASCULAR PRODUCTS) ×1 IMPLANT
GLIDESHEATH SLEND A-KIT 6F 22G (SHEATH) ×1 IMPLANT
GUIDEWIRE INQWIRE 1.5J.035X260 (WIRE) IMPLANT
INQWIRE 1.5J .035X260CM (WIRE) ×2
KIT ENCORE 26 ADVANTAGE (KITS) ×1 IMPLANT
KIT HEART LEFT (KITS) ×2 IMPLANT
PACK CARDIAC CATHETERIZATION (CUSTOM PROCEDURE TRAY) ×2 IMPLANT
SHEATH PINNACLE 5F 10CM (SHEATH) ×1 IMPLANT
SHEATH PINNACLE 6F 10CM (SHEATH) ×1 IMPLANT
SHEATH PINNACLE 7F 10CM (SHEATH) ×1 IMPLANT
SHEATH PROBE COVER 6X72 (BAG) ×1 IMPLANT
SLEEVE REPOSITIONING LENGTH 30 (MISCELLANEOUS) ×1 IMPLANT
STENT RESOLUTE ONYX 2.75X22 (Permanent Stent) ×1 IMPLANT
TRANSDUCER W/STOPCOCK (MISCELLANEOUS) ×2 IMPLANT
TUBING CIL FLEX 10 FLL-RA (TUBING) ×2 IMPLANT
WIRE ASAHI PROWATER 180CM (WIRE) ×2 IMPLANT
WIRE EMERALD 3MM-J .035X150CM (WIRE) ×1 IMPLANT

## 2018-12-12 NOTE — Consult Note (Signed)
Responded to page, pt unavailable, no family present, staff will page again if further need of chaplain services.  Rev. Eloise Levels Chaplain

## 2018-12-12 NOTE — Anesthesia Procedure Notes (Signed)
Procedure Name: Intubation Date/Time: 12/12/2018 8:10 AM Performed by: Glynda Jaeger, CRNA Pre-anesthesia Checklist: Patient identified, Patient being monitored, Timeout performed, Emergency Drugs available and Suction available Patient Re-evaluated:Patient Re-evaluated prior to induction Oxygen Delivery Method: Circle System Utilized Preoxygenation: Pre-oxygenation with 100% oxygen Induction Type: IV induction Ventilation: Mask ventilation without difficulty Laryngoscope Size: Glidescope and 4 Grade View: Grade I Tube type: Oral Tube size: 7.5 mm Number of attempts: 1 Airway Equipment and Method: Stylet and Video-laryngoscopy Placement Confirmation: ETT inserted through vocal cords under direct vision,  positive ETCO2 and breath sounds checked- equal and bilateral Secured at: 21 cm Tube secured with: Tape Dental Injury: Teeth and Oropharynx as per pre-operative assessment

## 2018-12-12 NOTE — Progress Notes (Signed)
Orthopedic Tech Progress Note Patient Details:  Chelsea Allison [birth year] [id number] RN called requesting KNEE IMMOBILIZER for patient. Applied it loose because of all the wires.  Ortho Devices Type of Ortho Device: Knee Immobilizer Ortho Device/Splint Location: LRE Ortho Device/Splint Interventions: Adjustment, Application, Ordered   Post Interventions Patient Tolerated: Well Instructions Provided: Care of device, Adjustment of device   Janit Pagan 12/12/2018, 10:45 AM

## 2018-12-12 NOTE — Progress Notes (Signed)
ANTICOAGULATION CONSULT NOTE   Pharmacy Consult for heparin Indication: chest pain/ACS / IABP  Allergies  Allergen Reactions  . Atorvastatin   . Hydrochlorothiazide   . Hydrocodone Nausea And Vomiting  . Lisinopril     Headache   . Oxycodone Nausea Only  . Statins Other (See Comments)    Patient Measurements: Height: 5\' 8"  (172.7 cm) Weight: 189 lb 9.5 oz (86 kg) IBW/kg (Calculated) : 63.9 Heparin Dosing Weight: 82kg  Vital Signs: Temp: 97.9 F (36.6 C) (10/05 1300) Temp Source: Core (Comment) (10/05 1200) BP: 131/80 (10/05 1300) Pulse Rate: 76 (10/05 1300)  Labs: Recent Labs    12/12/18 0657  12/12/18 0729 12/12/18 0828 12/12/18 0843 12/12/18 1132  HGB 16.8*   < > 15.6* 17.7* 17.3*  17.3*  --   HCT 53.9*   < > 46.0 52.0* 51.0*  51.0*  --   PLT 327  --   --   --   --   --   APTT 30  --   --   --   --   --   LABPROT 13.3  --   --   --   --   --   INR 1.0  --   --   --   --   --   CREATININE  --   --  0.90  --   --   --   TROPONINIHS 65*  --   --   --   --  >27,000*   < > = values in this interval not displayed.    Estimated Creatinine Clearance: 74.4 mL/min (by C-G formula based on SCr of 0.9 mg/dL).   Medical History: Past Medical History:  Diagnosis Date  . Cancer of lung ([facility])    Twice  . Colon cancer ([place])   . COPD (chronic obstructive pulmonary disease) ([place])   . Hypertension   . Neuropathy    Secondary to chemo  . STEMI (ST elevation myocardial infarction) ([place]) 12/12/2018   Assessment: 63 year old female presented this morning as stemi, DES/PCI to Cfx. IABP placed in lab. There was concern for ICH/stroke during procedure where patient experienced blank stare and was able to be aroused, differential includes narcotics given during procedure. Patient currently on vent awake/alert following all commands on neurology exam.   Head CT negative, d/w cardiology NP will start low dose heparin for IABP.    Goal of Therapy:  Heparin level goal  0.2-0.5 Monitor platelets by anticoagulation protocol: Yes   Plan:  Tirofiban mcg/kg/min x 18h  Start heparin at 800 units/hr Heparin level in 6 hours  Erin Hearing PharmD., BCPS Clinical Pharmacist 12/12/2018 3:40 PM   .Lottie Mussel

## 2018-12-12 NOTE — Progress Notes (Signed)
Patient seen and examined   Awake on vent and fully responsive  BP is good off pressors. Co ox 70.   NSR with some episodes of NSVT  Right groin looks good.   On Aggrastat only. CT of head negative for bleed so will go ahead and load Brilinta 180 mg orally then 90 mg bid  Will check ACT. IABP reduced to 1:3.  Will plan on removing IABP this evening. Do not need to resume IV heparin. Will start DVT prophylaxis in am.  Peter Martinique MD, [facility]

## 2018-12-12 NOTE — Progress Notes (Signed)
IABP aspirated and removed from RFA, manual pressure applied to site for 30 minutes. Site level 0, no S+S of hematoma, Biospot patch replaced on swan sheath then both sites covered with occlusive dressing.   Patient intubated, but responsive, bedrest instructions given.  Bilateral dp and pt pulses present with doppler.  Bedrest begins at 17:45:00

## 2018-12-12 NOTE — Progress Notes (Signed)
  Echocardiogram 2D Echocardiogram has been performed.  Technically difficult study due to pad on chest and patient non compliant. Nurse stated not to remove.   Kerstie Agent L Androw 12/12/2018, 1:50 PM

## 2018-12-12 NOTE — Progress Notes (Signed)
ANTICOAGULATION CONSULT NOTE - Initial Consult  Pharmacy Consult for aggrastat Indication: chest pain/ACS post stent placement  Allergies  Allergen Reactions  . Atorvastatin   . Hydrochlorothiazide   . Hydrocodone Nausea And Vomiting  . Lisinopril     Other reaction(s): Other (See Comments) Other reaction(s): HEADACHE Other reaction(s): HEADACHE   . Oxycodone Nausea Only  . Statins Other (See Comments)    Patient Measurements: Height: 5\' 8"  (172.7 cm) Weight: 189 lb 9.5 oz (86 kg) IBW/kg (Calculated) : 63.9 Heparin Dosing Weight: 82kg  Vital Signs: Temp: 96.8 F (36 C) (10/05 1100) BP: 138/92 (10/05 1100) Pulse Rate: 84 (10/05 1100)  Labs: Recent Labs    12/12/18 0657  12/12/18 0729 12/12/18 0828 12/12/18 0843  HGB 16.8*   < > 15.6* 17.7* 17.3*  17.3*  HCT 53.9*   < > 46.0 52.0* 51.0*  51.0*  PLT 327  --   --   --   --   APTT 30  --   --   --   --   LABPROT 13.3  --   --   --   --   INR 1.0  --   --   --   --   CREATININE  --   --  0.90  --   --   TROPONINIHS 65*  --   --   --   --    < > = values in this interval not displayed.    Estimated Creatinine Clearance: 74.4 mL/min (by C-G formula based on SCr of 0.9 mg/dL).   Medical History: Past Medical History:  Diagnosis Date  . Cancer of lung ([place])    Twice  . Colon cancer ([place])   . COPD (chronic obstructive pulmonary disease) ([place])   . Hypertension   . Neuropathy    Secondary to chemo  . STEMI (ST elevation myocardial infarction) ([place]) 12/12/2018   Assessment: 63 year old female presented this morning as stemi, DES/PCI to Cfx. IABP placed in lab. There was concern for ICH/stroke during procedure where patient experienced blank stare and was able to be aroused, differential includes narcotics given during procedure. Patient currently on vent awake/alert following all commands on neurology exam.   Will check head CT when able to r/o bleeding. Continue aggrastat at current dose given stent, holding  brilinta/heparin.    Goal of Therapy:  Monitor platelets by anticoagulation protocol: Yes   Plan:  Tirofiban mcg/kg/min x 18h  Follow up head CT - anticoagulation for IABP  Erin Hearing PharmD., BCPS Clinical Pharmacist 12/12/2018 11:55 AM

## 2018-12-12 NOTE — ED Provider Notes (Signed)
[place] EMERGENCY DEPARTMENT Provider Note   CSN: [id number] Arrival date & time: 12/12/18  7939     History   Chief Complaint No chief complaint on file.   HPI Chelsea Allison is a 63 y.o. female.     Patient brought to the emergency department by EMS with chest pain.  Patient reports being awakened from sleep approximately 3 hours before calling EMS with chest pain which was anterior and radiating to the right arm.  EMS performed an EKG which was suspicious for possible inferior MI.  She was administered aspirin 324 mg but no nitroglycerin because of the possibility of right ventricular involvement.  During transport her pain worsened, now having pain radiating down the left arm.     Past Medical History:  Diagnosis Date  . Cancer of lung ([place])    Twice  . Colon cancer ([place])   . COPD (chronic obstructive pulmonary disease) ([place])   . Hypertension   . Neuropathy    Secondary to chemo    There are no active problems to display for this patient.     OB History   No obstetric history on file.      Home Medications    Prior to Admission medications   Not on File    Family History No family history on file.  Social History Social History   Tobacco Use  . Smoking status: Current Every Day Smoker    Types: Cigarettes  . Smokeless tobacco: Never Used  Substance Use Topics  . Alcohol use: Not on file  . Drug use: Not on file     Allergies   Atorvastatin, Hydrochlorothiazide, Hydrocodone, Lisinopril, Oxycodone, and Statins   Review of Systems Review of Systems  Cardiovascular: Positive for chest pain.  All other systems reviewed and are negative.    Physical Exam Updated Vital Signs There were no vitals taken for this visit.  Physical Exam Vitals signs and nursing note reviewed.  Constitutional:      General: She is not in acute distress.    Appearance: Normal appearance. She is well-developed.  HENT:     Head:  Normocephalic and atraumatic.     Right Ear: Hearing normal.     Left Ear: Hearing normal.     Nose: Nose normal.  Eyes:     Conjunctiva/sclera: Conjunctivae normal.     Pupils: Pupils are equal, round, and reactive to light.  Neck:     Musculoskeletal: Normal range of motion and neck supple.  Cardiovascular:     Rate and Rhythm: Regular rhythm.     Heart sounds: S1 normal and S2 normal. No murmur. No friction rub. No gallop.   Pulmonary:     Effort: Pulmonary effort is normal. No respiratory distress.     Breath sounds: Normal breath sounds.  Chest:     Chest wall: No tenderness.  Abdominal:     General: Bowel sounds are normal.     Palpations: Abdomen is soft.     Tenderness: There is no abdominal tenderness. There is no guarding or rebound. Negative signs include Murphy's sign and McBurney's sign.     Hernia: No hernia is present.  Musculoskeletal: Normal range of motion.  Skin:    General: Skin is warm and dry.     Findings: No rash.  Neurological:     Mental Status: She is alert and oriented to person, place, and time.     GCS: GCS eye subscore is 4. GCS verbal subscore  is 5. GCS motor subscore is 6.     Cranial Nerves: No cranial nerve deficit.     Sensory: No sensory deficit.     Coordination: Coordination normal.  Psychiatric:        Speech: Speech normal.        Behavior: Behavior normal.        Thought Content: Thought content normal.      ED Treatments / Results  Labs (all labs ordered are listed, but only abnormal results are displayed) Labs Reviewed  CBC WITH DIFFERENTIAL/PLATELET - Abnormal; Notable for the following components:      Result Value   WBC 12.8 (*)    RBC 5.96 (*)    Hemoglobin 16.8 (*)    HCT 53.9 (*)    Neutro Abs 10.7 (*)    All other components within normal limits  SARS CORONAVIRUS 2 (HOSPITAL ORDER, [facility])  PROTIME-INR  APTT  COMPREHENSIVE METABOLIC PANEL  LIPID PANEL  TROPONIN I (HIGH  SENSITIVITY)    EKG EKG Interpretation  Date/Time:  Monday December 12 2018 06:58:15 EDT Ventricular Rate:  95 PR Interval:    QRS Duration: 109 QT Interval:  337 QTC Calculation: 424 R Axis:   81 Text Interpretation:  Sinus rhythm Biatrial enlargement Borderline right axis deviation Inferior STEMI Confirmed by Orpah Greek [phone]) on 12/12/2018 7:12:52 AM   Radiology No results found.  Procedures Procedures (including critical care time)  Medications Ordered in ED Medications  0.9 %  sodium chloride infusion (has no administration in time range)  heparin injection 4,000 Units (has no administration in time range)     Initial Impression / Assessment and Plan / ED Course  I have reviewed the triage vital signs and the nursing notes.  Pertinent labs & imaging results that were available during my care of the patient were reviewed by me and considered in my medical decision making (see chart for details).        Patient arrives to the ER by EMS from home with chest pain that has been present for 3 to 4 hours.  Patient's chest pain worsening during transport.  EKG with inferior ST elevations and anterior ST depressions consistent with inferior ST elevation MI.  Code STEMI activated by EMS.  Patient evaluated in the ER and brought to the Cath Lab.  CRITICAL CARE Performed by: Orpah Greek   Total critical care time: 30 minutes  Critical care time was exclusive of separately billable procedures and treating other patients.  Critical care was necessary to treat or prevent imminent or life-threatening deterioration.  Critical care was time spent personally by me on the following activities: development of treatment plan with patient and/or surrogate as well as nursing, discussions with consultants, evaluation of patient's response to treatment, examination of patient, obtaining history from patient or surrogate, ordering and performing treatments and  interventions, ordering and review of laboratory studies, ordering and review of radiographic studies, pulse oximetry and re-evaluation of patient's condition.   Final Clinical Impressions(s) / ED Diagnoses   Final diagnoses:  ST elevation myocardial infarction (STEMI), unspecified artery [facility])    ED Discharge Orders    None       Orpah Greek, MD 12/12/18 [phone]

## 2018-12-12 NOTE — Progress Notes (Signed)
CT head with no evidence of acute bleed or evolving infarct.  -- Amie Portland, MD

## 2018-12-12 NOTE — H&P (Addendum)
The patient has been seen in conjunction with Chelsea Ferries, PA-C. All aspects of care have been considered and discussed. The patient has been personally interviewed, examined, and all clinical data has been reviewed.   The patient was seen in the emergency department, Trauma B area.  She admitted to chest discomfort ongoing since 3:30 AM.  All EKGs available including those done in the field were reviewed.  There was minimal ST elevation in 2, 3, aVF, and reciprocal ST depression in V2 and V3.  STEMI activation was felt to be appropriate.  Patient has significant comorbidities including colostomy for prior colon cancer and partial right lung resection 2015 for cancer.  She has significant COPD but we have no data as her care is usually [facility] at [facility].  The patient is hemodynamically stable, has moderate shortness of breath, good skin color, and stable vital signs with blood pressure 150/60.  Extremities reveal no edema.  Abdomen reveals the site of the colostomy bag.  Radial pulses are 2+ and symmetric bilaterally.  Dorsalis pedis pulses are 2+ and symmetric.  Neuro exam is unremarkable.  Ongoing chest pain in the setting of what appears to be a lateral wall or inferolateral infarction by EKG consistent with ST elevation MI.  Urgent/emergency cardiac catheterization with revascularization determined by anatomy.  Time to reperfusion is delayed due to difficulty obtaining IV, absence of a COVID-19 nasal swab, change of shift with decreased personnel to help transport, and among other in lab issues..  Critical CARE time: 40 minutes       Cardiology Admission History and Physical:   Patient ID: Chelsea Allison; MRN: [id number]; DOB: [birth year]   Admission date: 12/12/2018  Primary Care Provider: Beckie Salts, MD Primary Cardiologist: Sinclair Grooms, MD New Primary Electrophysiologist: None   Chief Complaint:  STEMI  Patient Profile:   Chelsea Allison is a 63 y.o. female with a history of hypertension, lung cancer x2, colon cancer x2 with rectal cancer, COPD, neuropathy secondary to chemo, but no cardiac issues.  She was awakened by chest pain this a.m. and called EMS.  Her ECG was abnormal and her pain was ongoing so a STEMI was activated and she was taken to the emergency room.  History of Present Illness:   Ms. Hachey has never had chest pain before.  Her activity level is poor at baseline.  She has problems with COPD and dyspnea on exertion.  She also has problems with ambulation due to neuropathy.  Today, at approximately 3:30 AM, she was awakened by substernal chest pain.  The pain is worse with deep inspiration, and eases up when she exhales.  She is a little bit better sitting up.  The pain is ongoing at an 8/10.  It radiates down her left arm.  She has not had nausea or vomiting.  EMS gave her aspirin, but there was concern for this being an inferior MI so nitro was not used.  Upon arrival to the emergency room, her ECG is still significantly abnormal.   Past Medical History:  Diagnosis Date  . Cancer of lung ([place])    Twice  . Colon cancer ([place])   . COPD (chronic obstructive pulmonary disease) ([place])   . Hypertension   . Neuropathy    Secondary to chemo  . STEMI (ST elevation myocardial infarction) ([place]) 12/12/2018    Past Surgical History:  Procedure Laterality Date  . ABDOMINAL SURGERY     For colon cancer  .  COLOSTOMY       Medications Prior to Admission: Prior to Admission medications   Gabapentin, Breo     Allergies:    Allergies  Allergen Reactions  . Atorvastatin   . Hydrochlorothiazide   . Hydrocodone Nausea And Vomiting  . Lisinopril     Other reaction(s): Other (See Comments) Other reaction(s): HEADACHE Other reaction(s): HEADACHE   . Oxycodone Nausea Only  . Statins Other (See Comments)    Social History:   Social History   Socioeconomic History  . Marital status: Single    Spouse name:  Not on file  . Number of children: Not on file  . Years of education: Not on file  . Highest education level: Not on file  Occupational History  . Not on file  Social Needs  . Financial resource strain: Not on file  . Food insecurity    Worry: Not on file    Inability: Not on file  . Transportation needs    Medical: Not on file    Non-medical: Not on file  Tobacco Use  . Smoking status: Current Every Day Smoker    Types: Cigarettes  . Smokeless tobacco: Never Used  Substance and Sexual Activity  . Alcohol use: Not on file  . Drug use: Not on file  . Sexual activity: Not on file  Lifestyle  . Physical activity    Days per week: Not on file    Minutes per session: Not on file  . Stress: Not on file  Relationships  . Social Herbalist on phone: Not on file    Gets together: Not on file    Attends religious service: Not on file    Active member of club or organization: Not on file    Attends meetings of clubs or organizations: Not on file    Relationship status: Not on file  . Intimate partner violence    Fear of current or ex partner: Not on file    Emotionally abused: Not on file    Physically abused: Not on file    Forced sexual activity: Not on file  Other Topics Concern  . Not on file  Social History Narrative   Patient lives in [place].    Family History: Not obtained due to emergent condition The patient's family history is not on file.   The patient has no family status information on file.     ROS:  She wheezes on a regular basis recently, but has not had any problems with fevers or chills. Otherwise, not obtainable due to emergent condition   Physical Exam/Data:  There were no vitals filed for this visit. No intake or output data in the 24 hours ending 12/12/18 0715 There were no vitals filed for this visit. There is no height or weight on file to calculate BMI.  General:  Well nourished, well developed, in acute distress HEENT:  normal Lymph: no adenopathy Neck:  JVD not elevated Endocrine:  No thryomegaly Vascular: No carotid bruits; FA pulses 2+ bilaterally without bruits  Cardiac:  normal S1, S2; RRR; no murmur, no rub or gallop  Lungs: Scattered rales bilaterally, no wheezing, rhonchi   Abd: soft, nontender, no hepatomegaly  Ext: No edema Musculoskeletal:  No deformities, BUE and BLE strength normal and equal Skin: warm and dry  Neuro:  CNs 2-12 intact, no focal abnormalities noted Psych:  Normal affect    EKG:  The ECG that was done 10/5 was personally reviewed and demonstrates  sinus rhythm with possible inferior ST elevation  Relevant CV Studies: None   Laboratory Data: COVID pending  ChemistryNo results for input(s): NA, K, CL, CO2, GLUCOSE, BUN, CREATININE, CALCIUM, GFRNONAA, GFRAA, ANIONGAP in the last 168 hours.  No results for input(s): PROT, ALBUMIN, AST, ALT, ALKPHOS, BILITOT in the last 168 hours. Hematology Recent Labs  Lab 12/12/18 0657  WBC 12.8*  RBC 5.96*  HGB 16.8*  HCT 53.9*  MCV 90.4  MCH 28.2  MCHC 31.2  RDW 14.1  PLT 327   Cardiac EnzymesNo results for input(s): TROPONINI in the last 168 hours. No results for input(s): TROPIPOC in the last 168 hours.  BNPNo results for input(s): BNP, PROBNP in the last 168 hours.  DDimer No results for input(s): DDIMER in the last 168 hours.  Radiology/Studies:  No results found.  Assessment and Plan:   1.  STEMI: - She is being taken emergently to the Cath Lab with further evaluation and treatment depending on the results.  She will be continued on home medications where applicable, and will be screened for cardiac risk factors.  Principal Problem:   STEMI (ST elevation myocardial infarction) ([place])   For questions or updates, please contact [facility] Please consult [email or web address] for contact info under Cardiology/STEMI.    SignedRosaria Ferries, PA-C  12/12/2018 7:15 AM

## 2018-12-12 NOTE — Progress Notes (Signed)
Pt intubated in cath lab by CRNA. Pt placed on full vent support at this time, tolerating well. #7.5ETT secured 24@lip .

## 2018-12-12 NOTE — CV Procedure (Signed)
   Acute myocardial infarction, with inferior minimal ST elevation and precordial precordial ST depression.  Background include severe COPD, prior partial right lung resection for lung cancer 2015, history of colon cancer with nephrostomy, chronic dyspnea and cough related to lung disease, and continued cigarette smoking.  Acute angiogram demonstrated 70% proximal RCA.  RCA is dominant and supplies collaterals to the apex and distal LAD.  There is a ramus that supplies the first septal perforator.  The LAD is proximally occluded.  Collaterals to the diagonal of the LAD fill via the ramus.  Circumflex is acutely occluded in the proximal to mid segment.  Not collateralized.  During the case the patient became progressively hypoxic.  40 mg of Lasix was given IV.  High flow oxygen was administered.  Within 10 minutes the patient developed presumed acute CO2 narcosis with unresponsiveness.  An acute neurological event is a possibility.  She stopped speaking, began staring, arterial blood gas during the procedure demonstrated a PCO2 of 81.  The patient was intubated by anesthesia.  Simultaneously and prior to intubation the circumflex was crossed with a wire, predilated with a 2 5 balloon and a 22 x 2.75 mm diameter Onyx was positioned and deployed at 15 atm.  During intubation and all the activity involved in the case, wire position was lost.  After reviewing the angiographic result, postdilatation was not felt to be necessary.  Because of hypoxia and elevated LV and diastolic pressure of 28 mmHg, an intra-aortic balloon pump was inserted and a right heart catheter was placed.  Both procedures were done from the right femoral using vascular ultrasound for arterial and venous access.  Closing ACT was 275 seconds and an additional 2000 units of heparin was administered.  The patient was given a total of 23953 of heparin during the procedure.  An Aggrastat bolus and infusion was started.  High risk presentation  with guarded prognosis related to medical comorbidities including chronic pulmonary insufficiency and also severe chronic coronary artery disease with pre-existing LAD total occlusion, acute occlusion of the circumflex, and 70% proximal RCA which collateralizes the LAD.  Acute intervention on the LAD was not entertained.

## 2018-12-12 NOTE — Progress Notes (Signed)
Spoke to [facility] (sister) and give brief updated on pt's condition.  Set up password. See chart.

## 2018-12-12 NOTE — Consult Note (Signed)
NAME:  Chelsea Allison, MRN:  [id number], DOB:  15-May-1955, LOS: 0 ADMISSION DATE:  12/12/2018, CONSULTATION DATE: 12/12/2018 REFERRING MD: Linard Millers, MD, CHIEF COMPLAINT: ST elevation MI, respiratory failure  Brief History   63 year old with severe COPD, lung cancer status post resection, active smoker Admitted with chest elevation MI.  Decompensated in the Cath Lab with hypercarbia and intubated.  PCCM consulted for vent management.  Past Medical History  Severe COPD, prior partial right lung resection for lung cancer 2015, history of colon cancer with nephrostomy.   She has severe COPD, lung cancer on Breo inhaler.  Follows at [facility] pulmonary.  Current active smoker.  Lung cancer was treated with chemotherapy, right upper lobectomy in 2015.  PET scan at [facility] April 2020 shows no evidence of recurrence.  Significant Hospital Events   10/5-Admit with MI, status post cath, intra-aortic balloon pump  Consults:  PCCM  Procedures:  Endotracheal tube 10/5 >  Significant Diagnostic Tests:    Micro Data:    Antimicrobials:    Interim history/subjective:   Objective   Weight 86 kg, SpO2 96 %.    Vent Mode: PRVC FiO2 (%):  [100 %] 100 % Set Rate:  [16 bmp] 16 bmp Vt Set:  [550 mL] 550 mL PEEP:  [5 cmH20] 5 cmH20 Plateau Pressure:  [29 cmH20] 29 cmH20  No intake or output data in the 24 hours ending 12/12/18 0938 Filed Weights   12/12/18 0714  Weight: 86 kg    Examination: Gen:      No acute distress HEENT:  EOMI, sclera anicteric Neck:     No masses; no thyromegaly, ET tube Lungs:    Bilateral expiratory wheeze CV:         Regular rate and rhythm; no murmurs Abd:      + bowel sounds; soft, non-tender; no palpable masses, no distension Ext:    No edema; adequate peripheral perfusion Skin:      Warm and dry; no rash Neuro: Awake on the ventilator.  No gross focal deficits  Resolved Hospital Problem list     Assessment & Plan:  Hypercarbic, hypoxic  respiratory failure in the setting of severe COPD, MI History of lung cancer status post resection, active smoker. Continue vent management Follow chest x-ray, ABG Scheduled bronchodilators with standing duo nebs and Pulmicort. Start Solu-Medrol as she is wheezing.  STEMI, cardiogenic shock Management per primary team Continue intra-aortic balloon pump. Wean off Levophed as tolerated.  Best practice:  Diet: NPO Pain/Anxiety/Delirium protocol (if indicated): Fentanyl, Versed VAP protocol (if indicated): In place DVT prophylaxis: Lovenox GI prophylaxis: Protonix Glucose control: Monitor sugars Mobility: Bedrest Code Status: Full Family Communication: Per primary Disposition: ICU  Labs   CBC: Recent Labs  Lab 12/12/18 0657  WBC 12.8*  NEUTROABS 10.7*  HGB 16.8*  HCT 53.9*  MCV 90.4  PLT 341    Basic Metabolic Panel: No results for input(s): NA, K, CL, CO2, GLUCOSE, BUN, CREATININE, CALCIUM, MG, PHOS in the last 168 hours. GFR: CrCl cannot be calculated (No successful lab value found.). Recent Labs  Lab 12/12/18 0657  WBC 12.8*    Liver Function Tests: No results for input(s): AST, ALT, ALKPHOS, BILITOT, PROT, ALBUMIN in the last 168 hours. No results for input(s): LIPASE, AMYLASE in the last 168 hours. No results for input(s): AMMONIA in the last 168 hours.  ABG No results found for: PHART, PCO2ART, PO2ART, HCO3, TCO2, ACIDBASEDEF, O2SAT   Coagulation Profile: Recent Labs  Lab 12/12/18  [phone]  INR 1.0    Cardiac Enzymes: No results for input(s): CKTOTAL, CKMB, CKMBINDEX, TROPONINI in the last 168 hours.  HbA1C: No results found for: HGBA1C  CBG: Recent Labs  Lab 12/12/18 0753  GLUCAP 119*    Review of Systems:   Unable to obtain due to intubated status.  Past Medical History  She,  has a past medical history of Cancer of lung ([place]), Colon cancer ([place]), COPD (chronic obstructive pulmonary disease) ([place]), Hypertension, Neuropathy, and STEMI (ST  elevation myocardial infarction) ([place]) (12/12/2018).   Surgical History    Past Surgical History:  Procedure Laterality Date  . ABDOMINAL SURGERY     For colon cancer  . COLOSTOMY       Social History   reports that she has been smoking cigarettes. She has never used smokeless tobacco.   Family History   Her family history is not on file.   Allergies Allergies  Allergen Reactions  . Atorvastatin   . Hydrochlorothiazide   . Hydrocodone Nausea And Vomiting  . Lisinopril     Other reaction(s): Other (See Comments) Other reaction(s): HEADACHE Other reaction(s): HEADACHE   . Oxycodone Nausea Only  . Statins Other (See Comments)     Home Medications  Prior to Admission medications   Not on File     Critical care time: 66    The patient is critically ill with multiple organ system failure and requires high complexity decision making for assessment and support, frequent evaluation and titration of therapies, advanced monitoring, review of radiographic studies and interpretation of complex data.   Critical Care Time devoted to patient care services, exclusive of separately billable procedures, described in this note is 35 minutes.   Marshell Garfinkel MD [facility] 12/12/2018, 11:35 AM

## 2018-12-12 NOTE — Consult Note (Addendum)
Neurology Consultation  Reason for Consult: Transient altered mental status Referring Physician: Dr. Tamala Julian  History is obtained from: Notes and Dr. Tamala Julian  HPI: Chelsea Allison is a 63 y.o. female with history of STEMI, neuropathy, hypertension, pulmonary disease/COPD, colon cancer and also lung cancer.   This is a patient who came to the emergency room at approximately 8:50 AM this morning with an acute myocardial infarction, ST elevation and pericardial precordial ST depression.  Patient went for an emergent angiogram.  While on the table patient became progressively hypoxic.  40 mg of Lasix was given IV to the patient.  High flow oxygen was administered.  Within 10 minutes the patient developed  unresponsiveness.  PCO2 showed a level of 81.  Due to patient's altered mental status and fear that patient may have developed a intracranial hematoma neurology was consulted.  At time of consultation patient had improved significantly per Dr. Tamala Julian.  She was following commands however she was intubated thus could not talk.  She did not show any localizing or lateralizing abnormal movements.  Stat CT was ordered  ROS:  Unable to obtain due to intubation.   Past Medical History:  Diagnosis Date  . Cancer of lung ([place])    Twice  . Colon cancer ([place])   . COPD (chronic obstructive pulmonary disease) ([place])   . Hypertension   . Neuropathy    Secondary to chemo  . STEMI (ST elevation myocardial infarction) ([place]) 12/12/2018     Family History  Problem Relation Age of Onset  . Hypertension Mother   . Hypertension Father    Social History:   reports that she has been smoking cigarettes. She has never used smokeless tobacco. No history on file for alcohol and drug.  Medications  Current Facility-Administered Medications:  .  0.9 %  sodium chloride infusion, , Intravenous, Continuous, Belva Crome, MD .  0.9 %  sodium chloride infusion, , Intravenous, Continuous, Belva Crome, MD, Last Rate: 100  mL/hr at 12/12/18 1016 .  0.9 %  sodium chloride infusion, 250 mL, Intravenous, PRN, Belva Crome, MD .  acetaminophen (TYLENOL) tablet 650 mg, 650 mg, Oral, Q4H PRN, Belva Crome, MD .  ALPRAZolam Duanne Moron) tablet 0.25 mg, 0.25 mg, Oral, BID PRN, Barrett, Evelene Croon, PA-C .  [START ON 12/13/2018] aspirin chewable tablet 81 mg, 81 mg, Per Tube, Daily, Belva Crome, MD .  atorvastatin (LIPITOR) tablet 80 mg, 80 mg, Per Tube, q1800, Belva Crome, MD .  bisacodyl (DULCOLAX) suppository 10 mg, 10 mg, Rectal, Daily PRN, Belva Crome, MD .  budesonide (PULMICORT) nebulizer solution 0.5 mg, 0.5 mg, Nebulization, BID, Belva Crome, MD .  Derrill Memo ON 12/13/2018] enoxaparin (LOVENOX) injection 40 mg, 40 mg, Subcutaneous, Q24H, Barrett, Rhonda G, PA-C .  EPINEPHrine (ADRENALIN) 1 MG/ML, , , ,  .  fentaNYL (SUBLIMAZE) bolus via infusion 50 mcg, 50 mcg, Intravenous, Q15 min PRN, Bowser, Grace E, NP .  fentaNYL 2597mcg in NS 260mL (3mcg/ml) infusion-PREMIX, 50-200 mcg/hr, Intravenous, Continuous, Bowser, Grace E, NP, Last Rate: 5 mL/hr at 12/12/18 1054, 50 mcg/hr at 12/12/18 1054 .  hydrALAZINE (APRESOLINE) injection 10 mg, 10 mg, Intravenous, Q20 Min PRN, Belva Crome, MD .  ipratropium-albuterol (DUONEB) 0.5-2.5 (3) MG/3ML nebulizer solution 3 mL, 3 mL, Nebulization, Q6H, Bowser, Grace E, NP .  labetalol (NORMODYNE) injection 10 mg, 10 mg, Intravenous, Q10 min PRN, Belva Crome, MD .  methylPREDNISolone sodium succinate (SOLU-MEDROL) 125 mg/2 mL injection 60 mg,  60 mg, Intravenous, Q6H, Bowser, Laurel Dimmer, NP, 60 mg at 12/12/18 1207 .  midazolam (VERSED) injection 2 mg, 2 mg, Intravenous, Q15 min PRN, Bowser, Laurel Dimmer, NP .  midazolam (VERSED) injection 2 mg, 2 mg, Intravenous, Q2H PRN, Bowser, Laurel Dimmer, NP .  nitroGLYCERIN (NITROSTAT) SL tablet 0.4 mg, 0.4 mg, Sublingual, Q5 Min x 3 PRN, Barrett, Rhonda G, PA-C .  ondansetron (ZOFRAN) injection 4 mg, 4 mg, Intravenous, Q6H PRN, Belva Crome, MD .   pantoprazole (PROTONIX) injection 40 mg, 40 mg, Intravenous, Daily, Bowser, Grace E, NP, 40 mg at 12/12/18 1207 .  sennosides (SENOKOT) 8.8 MG/5ML syrup 5 mL, 5 mL, Per Tube, BID PRN, Belva Crome, MD .  sodium chloride flush (NS) 0.9 % injection 3 mL, 3 mL, Intravenous, Q12H, Belva Crome, MD .  sodium chloride flush (NS) 0.9 % injection 3 mL, 3 mL, Intravenous, PRN, Belva Crome, MD .  ticagrelor [facility]) tablet 180 mg, 180 mg, Oral, Once, Belva Crome, MD .  ticagrelor [facility]) tablet 90 mg, 90 mg, Per Tube, BID, Belva Crome, MD .  tirofiban (AGGRASTAT) infusion 50 mcg/mL 100 mL, 0.15 mcg/kg/min, Intravenous, Continuous, Lyndee Leo, RPH, Last Rate: 15.48 mL/hr at 12/12/18 1055, 0.15 mcg/kg/min at 12/12/18 1055 .  zolpidem (AMBIEN) tablet 5 mg, 5 mg, Oral, QHS PRN, Barrett, Evelene Croon, PA-C   Exam: Current vital signs: BP (!) 160/96   Pulse 74   Temp (!) 97 F (36.1 C)   Resp (!) 24   Ht 5\' 8"  (1.727 m)   Wt 86 kg   SpO2 100%   BMI 28.83 kg/m  Vital signs in last 24 hours: Temp:  [96.1 F (35.6 C)-97 F (36.1 C)] 97 F (36.1 C) (10/05 1130) Pulse Rate:  [0-136] 74 (10/05 1130) Resp:  [0-62] 24 (10/05 1130) BP: (69-174)/(46-107) 160/96 (10/05 1130) SpO2:  [0 %-100 %] 100 % (10/05 1130) Arterial Line BP: (127-155)/(54-65) 148/61 (10/05 1108) FiO2 (%):  [60 %-100 %] 60 % (10/05 1130) Weight:  [86 kg] 86 kg (10/05 1100)  Physical Exam  Constitutional: Appears well-developed and well-nourished.  Psych: Affect appropriate to situation Eyes: No scleral injection HENT: No OP obstrucion Head: Normocephalic.  Cardiovascular: Normal rate and regular rhythm.  Respiratory: Effort normal, non-labored breathing GI: Soft.  No distension. There is no tenderness.  Skin: WDI  Neuro: Mental Status: Patient is intubated at this time however is able to follow commands Cranial Nerves: II: Visual Fields are full.  III,IV, VI: EOMI without ptosis or diploplia. Pupils  equal, round and reactive to light V: Facial sensation is symmetric to temperature VII: Facial movement is symmetric.  Motor: Bilateral shoulder shrug equal, she is able to lift bilateral forearms off the bed with equal strength, she has weak but equal strength.  She is able to lift bilateral legs off the table with equal strength Sensory: Sensation is symmetric to light touch and temperature in the arms and legs. Deep Tendon Reflexes: 2+ and symmetric in the biceps and patellae.  Plantars: Toes are downgoing bilaterally.   Labs I have reviewed labs in epic and the results pertinent to this consultation are:   CBC    Component Value Date/Time   WBC 12.8 (H) 12/12/2018 0657   RBC 5.96 (H) 12/12/2018 0657   HGB 17.3 (H) 12/12/2018 0843   HGB 17.3 (H) 12/12/2018 0843   HCT 51.0 (H) 12/12/2018 0843   HCT 51.0 (H) 12/12/2018 0843   PLT 327  12/12/2018 0657   MCV 90.4 12/12/2018 0657   MCH 28.2 12/12/2018 0657   MCHC 31.2 12/12/2018 0657   RDW 14.1 12/12/2018 0657   LYMPHSABS 1.2 12/12/2018 0657   MONOABS 0.7 12/12/2018 0657   EOSABS 0.1 12/12/2018 0657   BASOSABS 0.0 12/12/2018 0657    Imaging I have reviewed the images obtained:  CT-scan of the brain-pending  MRI examination of the brain-unable to obtain secondary to balloon pump  Etta Quill PA-C [facility] [phone]  M-F  (9:00 am- 5:00 PM)  12/12/2018, 12:09 PM    Attending addendum Patient seen and examined in the the ICU. Agree with history and physical as above. Patient became unresponsive on the table in the Cath Lab after having dropped O2 saturations. Has a history of lung cancer. Rapidly coming around. No seizure activity noted. Likely CO2 narcosis Low suspicion for seizure Due to STEMI, cardiac cath and heparinization, would like to rule out intracerebral hemorrhage.  Assessment:  63 year old female presented to the hospital with acute myocardial infarct.  During cardiac  catheterization had a transient period of significant drop in oxygenation with elevated PCO2 up to 80.  During this time patient was altered with a blank stare and not responsive to noxious stimuli.  At this time patient has improved and is able to follow commands.  Most likely patient had suffered from CO2 narcosis.  However will obtain CT of head to rule out an ICH   Recommendations: - Obtain CT head when capable, as he is having multiple runs of V. tach while in ICU at this time -If negative for an acute bleed, attempt to extubate. -In the absence of focal deficits, no further neurological work-up at this time. Discussed with Dr. Tamala Julian, patient's primary attending on the unit.  -- Amie Portland, MD [facility] Pager: [phone] If 7pm to 7am, please call on call as listed on AMION.  CRITICAL CARE ATTESTATION Performed by: Amie Portland, MD Total critical care time 30 minutes Critical care time was exclusive of separately billable procedures and treating other patients and/or supervising APPs/Residents/Students Critical care was necessary to treat or prevent imminent or life-threatening deterioration due to multifactorial toxic metabolic encephalopathy, carbon dioxide narcosis/hypercarbia. This patient is critically ill and at significant risk for neurological worsening and/or death and care requires constant monitoring. Critical care was time spent personally by me on the following activities: development of treatment plan with patient and/or surrogate as well as nursing, discussions with consultants, evaluation of patient's response to treatment, examination of patient, obtaining history from patient or surrogate, ordering and performing treatments and interventions, ordering and review of laboratory studies, ordering and review of radiographic studies, pulse oximetry, re-evaluation of patient's condition, participation in multidisciplinary rounds and medical decision making of  high complexity in the care of this patient.

## 2018-12-12 NOTE — ED Triage Notes (Signed)
Pt coming in by EMS after developing CP around 0330 this AM after being awaken from her sleep by the pain. Pain located in central chest, sharp shooting pain that radiated down her right arm. Pt called EMS @ 0530. No cardiac hx noted. Vitals 150/110, HR 90s, O2 sat 95, RR 18, temp 97.6. Pt alert and oriented. Pt had taken 324mg  of ASA  @ home. EMS activated Code STEMI

## 2018-12-13 ENCOUNTER — Other Ambulatory Visit: Payer: Self-pay

## 2018-12-13 ENCOUNTER — Encounter (HOSPITAL_COMMUNITY): Payer: Self-pay | Admitting: *Deleted

## 2018-12-13 ENCOUNTER — Encounter: Payer: Self-pay | Admitting: Cardiology

## 2018-12-13 ENCOUNTER — Inpatient Hospital Stay (HOSPITAL_COMMUNITY): Payer: Medicare HMO

## 2018-12-13 DIAGNOSIS — I213 ST elevation (STEMI) myocardial infarction of unspecified site: Secondary | ICD-10-CM

## 2018-12-13 DIAGNOSIS — J9622 Acute and chronic respiratory failure with hypercapnia: Secondary | ICD-10-CM

## 2018-12-13 DIAGNOSIS — I2121 ST elevation (STEMI) myocardial infarction involving left circumflex coronary artery: Secondary | ICD-10-CM

## 2018-12-13 DIAGNOSIS — R0689 Other abnormalities of breathing: Secondary | ICD-10-CM

## 2018-12-13 DIAGNOSIS — I5041 Acute combined systolic (congestive) and diastolic (congestive) heart failure: Secondary | ICD-10-CM

## 2018-12-13 LAB — POCT I-STAT 7, (LYTES, BLD GAS, ICA,H+H)
Acid-base deficit: 3 mmol/L — ABNORMAL HIGH (ref 0.0–2.0)
Acid-base deficit: 1 mmol/L (ref 0.0–2.0)
Bicarbonate: 28.1 mmol/L — ABNORMAL HIGH (ref 20.0–28.0)
Bicarbonate: 26.3 mmol/L (ref 20.0–28.0)
Calcium, Ion: 1.3 mmol/L (ref 1.15–1.40)
Calcium, Ion: 1.3 mmol/L (ref 1.15–1.40)
HCT: 46 % (ref 36.0–46.0)
HCT: 46 % (ref 36.0–46.0)
Hemoglobin: 15.6 g/dL — ABNORMAL HIGH (ref 12.0–15.0)
Hemoglobin: 15.6 g/dL — ABNORMAL HIGH (ref 12.0–15.0)
O2 Saturation: 100 %
O2 Saturation: 97 %
Patient temperature: 98.6
Patient temperature: 98.6
Potassium: 4.2 mmol/L (ref 3.5–5.1)
Potassium: 3.4 mmol/L — ABNORMAL LOW (ref 3.5–5.1)
Sodium: 142 mmol/L (ref 135–145)
Sodium: 143 mmol/L (ref 135–145)
TCO2: 31 mmol/L (ref 22–32)
TCO2: 28 mmol/L (ref 22–32)
pCO2 arterial: 81.3 mmHg (ref 32.0–48.0)
pCO2 arterial: 52.5 mmHg — ABNORMAL HIGH (ref 32.0–48.0)
pH, Arterial: 7.147 — CL (ref 7.350–7.450)
pH, Arterial: 7.308 — ABNORMAL LOW (ref 7.350–7.450)
pO2, Arterial: 510 mmHg — ABNORMAL HIGH (ref 83.0–108.0)
pO2, Arterial: 96 mmHg (ref 83.0–108.0)

## 2018-12-13 LAB — CBC
HCT: 45.1 % (ref 36.0–46.0)
Hemoglobin: 14.2 g/dL (ref 12.0–15.0)
MCH: 27.5 pg (ref 26.0–34.0)
MCHC: 31.5 g/dL (ref 30.0–36.0)
MCV: 87.2 fL (ref 80.0–100.0)
Platelets: 344 10*3/uL (ref 150–400)
RBC: 5.17 MIL/uL — ABNORMAL HIGH (ref 3.87–5.11)
RDW: 14.1 % (ref 11.5–15.5)
WBC: 14.8 10*3/uL — ABNORMAL HIGH (ref 4.0–10.5)
nRBC: 0 % (ref 0.0–0.2)

## 2018-12-13 LAB — COMPREHENSIVE METABOLIC PANEL
ALT: 69 U/L — ABNORMAL HIGH (ref 0–44)
AST: 219 U/L — ABNORMAL HIGH (ref 15–41)
Albumin: 3 g/dL — ABNORMAL LOW (ref 3.5–5.0)
Alkaline Phosphatase: 87 U/L (ref 38–126)
Anion gap: 12 (ref 5–15)
BUN: 26 mg/dL — ABNORMAL HIGH (ref 8–23)
CO2: 22 mmol/L (ref 22–32)
Calcium: 8.7 mg/dL — ABNORMAL LOW (ref 8.9–10.3)
Chloride: 107 mmol/L (ref 98–111)
Creatinine, Ser: 1.32 mg/dL — ABNORMAL HIGH (ref 0.44–1.00)
GFR calc Af Amer: 50 mL/min — ABNORMAL LOW (ref >60)
GFR calc non Af Amer: 43 mL/min — ABNORMAL LOW (ref >60)
Glucose, Bld: 162 mg/dL — ABNORMAL HIGH (ref 70–99)
Potassium: 3.1 mmol/L — ABNORMAL LOW (ref 3.5–5.1)
Sodium: 141 mmol/L (ref 135–145)
Total Bilirubin: 0.2 mg/dL — ABNORMAL LOW (ref 0.3–1.2)
Total Protein: 6.2 g/dL — ABNORMAL LOW (ref 6.5–8.1)

## 2018-12-13 LAB — LIPID PANEL
Cholesterol: 239 mg/dL — ABNORMAL HIGH (ref 0–200)
HDL: 38 mg/dL — ABNORMAL LOW (ref >40)
LDL Cholesterol: 172 mg/dL — ABNORMAL HIGH (ref 0–99)
Total CHOL/HDL Ratio: 6.3 RATIO
Triglycerides: 144 mg/dL (ref <150)
VLDL: 29 mg/dL (ref 0–40)

## 2018-12-13 MED ORDER — LORAZEPAM 2 MG/ML IJ SOLN
1.0000 mg | INTRAMUSCULAR | Status: DC | PRN
  Administered 2018-12-13 – 2018-12-15 (×2): 1 mg via INTRAVENOUS
  Filled 2018-12-13: qty 1

## 2018-12-13 MED ORDER — MORPHINE SULFATE (PF) 2 MG/ML IV SOLN
1.0000 mg | Freq: Once | INTRAVENOUS | Status: AC
  Administered 2018-12-13: 1 mg via INTRAVENOUS
  Filled 2018-12-13: qty 1

## 2018-12-13 MED ORDER — PHENOL 1.4 % MT LIQD
2.0000 | OROMUCOSAL | Status: DC | PRN
  Filled 2018-12-13: qty 177

## 2018-12-13 MED ORDER — METHYLPREDNISOLONE SODIUM SUCC 125 MG IJ SOLR
125.0000 mg | Freq: Once | INTRAMUSCULAR | Status: AC
  Administered 2018-12-13: 22:00:00 125 mg via INTRAVENOUS
  Filled 2018-12-13: qty 2

## 2018-12-13 MED ORDER — CARVEDILOL 3.125 MG PO TABS
3.1250 mg | ORAL_TABLET | Freq: Two times a day (BID) | ORAL | Status: DC
  Filled 2018-12-13: qty 1

## 2018-12-13 MED ORDER — POTASSIUM CHLORIDE 20 MEQ PO PACK
30.0000 meq | PACK | Freq: Two times a day (BID) | ORAL | Status: AC
  Administered 2018-12-13 (×2): 30 meq
  Filled 2018-12-13 (×2): qty 2

## 2018-12-13 MED ORDER — FUROSEMIDE 10 MG/ML IJ SOLN
40.0000 mg | Freq: Once | INTRAMUSCULAR | Status: AC
  Administered 2018-12-13: 22:00:00 40 mg via INTRAVENOUS
  Filled 2018-12-13: qty 4

## 2018-12-13 MED ORDER — IPRATROPIUM-ALBUTEROL 0.5-2.5 (3) MG/3ML IN SOLN
3.0000 mL | RESPIRATORY_TRACT | Status: DC
  Administered 2018-12-13: 3 mL via RESPIRATORY_TRACT
  Filled 2018-12-13: qty 30
  Filled 2018-12-13 (×2): qty 3

## 2018-12-13 MED ORDER — METHYLPREDNISOLONE SODIUM SUCC 40 MG IJ SOLR
40.0000 mg | Freq: Every day | INTRAMUSCULAR | Status: DC
  Administered 2018-12-14: 10:00:00 40 mg via INTRAVENOUS
  Filled 2018-12-13: qty 1

## 2018-12-13 MED ORDER — LEVALBUTEROL HCL 0.63 MG/3ML IN NEBU
0.6300 mg | INHALATION_SOLUTION | RESPIRATORY_TRACT | Status: DC | PRN
  Administered 2018-12-15: 0.63 mg via RESPIRATORY_TRACT
  Filled 2018-12-13: qty 3

## 2018-12-13 MED ORDER — POTASSIUM CHLORIDE 20 MEQ PO PACK: 40.0000 meq | PACK | Freq: Two times a day (BID) | ORAL | Status: DC

## 2018-12-13 MED ORDER — LEVALBUTEROL HCL 0.63 MG/3ML IN NEBU
0.6300 mg | INHALATION_SOLUTION | Freq: Four times a day (QID) | RESPIRATORY_TRACT | Status: DC
  Administered 2018-12-14 – 2018-12-15 (×7): 0.63 mg via RESPIRATORY_TRACT
  Filled 2018-12-13 (×7): qty 3

## 2018-12-13 MED ORDER — BISOPROLOL FUMARATE 5 MG PO TABS
5.0000 mg | ORAL_TABLET | Freq: Every day | ORAL | Status: DC
  Administered 2018-12-13 – 2018-12-15 (×3): 5 mg via ORAL
  Filled 2018-12-13 (×3): qty 1

## 2018-12-13 MED ORDER — LORAZEPAM 2 MG/ML IJ SOLN
INTRAMUSCULAR | Status: AC
  Administered 2018-12-13: 21:00:00 1 mg via INTRAVENOUS
  Filled 2018-12-13: qty 1

## 2018-12-13 MED ORDER — PIPERACILLIN-TAZOBACTAM 3.375 G IVPB
3.3750 g | Freq: Three times a day (TID) | INTRAVENOUS | Status: DC
  Administered 2018-12-13 – 2018-12-15 (×5): 3.375 g via INTRAVENOUS
  Filled 2018-12-13 (×5): qty 50

## 2018-12-13 MED FILL — Nitroglycerin IV Soln 100 MCG/ML in D5W: INTRA_ARTERIAL | Qty: 10 | Status: AC

## 2018-12-13 NOTE — Progress Notes (Addendum)
eLink Physician-Brief Progress Note Patient Name: Chelsea Allison DOB: [birth year] MRN: [id number]   Date of Service  12/13/2018  HPI/Events of Note  Review of STAT portable CXR - 1. Acute pneumonia in the RIGHT UPPER LOBE since the examination this morning, possibly aspiration. 2. Pulmonary venous hypertension and perhaps mild interstitial pulmonary edema, query fluid overload.  eICU Interventions  Will order: 1. Blood cultures X 2. 2. Sputum culture now.  3. Vancomycin and Zosyn per pharmacy consult. 4. Lasix 40 mg X 1 now.  5. PCCM ground team asked to evaluate patient at bedside.      Intervention Category Major Interventions: Hypoxemia - evaluation and management  Sommer,Steven Eugene 12/13/2018, 9:17 PM

## 2018-12-13 NOTE — Progress Notes (Signed)
Arterial results given to CCM, will repeat blood gas in one hour.

## 2018-12-13 NOTE — Telephone Encounter (Signed)
This encounter was created in error - please disregard.

## 2018-12-13 NOTE — Progress Notes (Signed)
161ml Fentanyl wasted at this time with Pauline Good, RN

## 2018-12-13 NOTE — Progress Notes (Signed)
Pharmacy Antibiotic Note  Chelsea Allison is a 63 y.o. female admitted on 12/12/2018 with STEMI.  This evening, pt became severely SOB with decrease in Sp02 levels. CXR indicates acute pneumonia in RUL, possibly aspiration. Pharmacy has been consulted for Zosyn dosing for aspiration pneumonia.   Medical history includes: systolic heart failure, acute encephalopathy secondary to hypercarbia, hypercarbic/hypoxic respiratory failure, hypertension, lung cancer, colon cancer with rectal cancer, COPD, neuropathy secondary to chemotherapy, nephrostomy  WBC 14.8, afebrile, HR 147/min, Scr 1.32, CrCl 51.8 ml/min  Plan: Zosyn 3.375 gm IV Q 8 hrs (extended infusion) Monitor renal function, WBC, VS, clinical improvement   Height: 5\' 8"  (172.7 cm) Weight: 197 lb 8.5 oz (89.6 kg) IBW/kg (Calculated) : 63.9  Temp (24hrs), Avg:98.6 F (37 C), Min:98.2 F (36.8 C), Max:98.8 F (37.1 C)  Recent Labs  Lab 12/12/18 0657 12/12/18 0729 12/12/18 2240 12/13/18 0429  WBC 12.8*  --  15.0* 14.8*  CREATININE  --  0.90  --  1.32*    Estimated Creatinine Clearance: 51.8 mL/min (A) (by C-G formula based on SCr of 1.32 mg/dL (H)).    Allergies  Allergen Reactions  . Hydrochlorothiazide Other (See Comments)    headaches  . Hydrocodone Nausea And Vomiting  . Lisinopril Other (See Comments)    Headache   . Oxycodone Nausea Only  . Statins Other (See Comments)    Microbiology results: 10/5  COVID: negative 10/5 MRSA PCR: negative  Thank you for allowing pharmacy to be a part of this patient's care.  Gillermina Hu, PharmD, BCPS, [facility] Clinical Pharmacist 12/13/2018 9:21 PM

## 2018-12-13 NOTE — Procedures (Signed)
Extubation Procedure Note  Patient Details:   Name: Onelia Cadmus DOB: [birth year] MRN: [id number]   Airway Documentation:    Vent end date: 12/13/18 Vent end time: 1010   Evaluation  O2 sats: stable throughout Complications: No apparent complications Patient did tolerate procedure well. Bilateral Breath Sounds: Expiratory wheezes  Patient able to speak Yes  Rudene Re 12/13/2018, 10:12 AM

## 2018-12-13 NOTE — Progress Notes (Signed)
Neurology Progress Note   S:// Not on any sedation.  Doing better.  No acute events overnight.   O:// Current vital signs: BP 119/69   Pulse 95   Temp 98.6 F (37 C)   Resp (!) 26   Ht '5\' 8"'  (1.727 m)   Wt 89.6 kg   SpO2 100%   BMI 30.03 kg/m  Vital signs in last 24 hours: Temp:  [96.1 F (35.6 C)-98.8 F (37.1 C)] 98.6 F (37 C) (10/06 0722) Pulse Rate:  [0-111] 95 (10/06 0722) Resp:  [0-62] 26 (10/06 0722) BP: (69-174)/(46-103) 119/69 (10/06 0700) SpO2:  [0 %-100 %] 100 % (10/06 0722) Arterial Line BP: (105-155)/(54-66) 105/57 (10/05 1700) FiO2 (%):  [40 %-100 %] 40 % (10/06 0722) Weight:  [86 kg-89.6 kg] 89.6 kg (10/06 0600) Neurological exam Intubated but not on any sedation Appears comfortable Follows all commands Is able to communicate with signs without trouble. Cranial nerves: Pupils equal round react light, extraocular movements intact, facial symmetry difficult to ascertain, blinks to threat from both sides, auditory acuity appears intact to conversation. Motor exam: Antigravity all 4 extremities without vertical drift. Sensory exam: Intact to light touch without extinction Coordination-no dysmetria   Medications  Current Facility-Administered Medications:  .  0.9 %  sodium chloride infusion, , Intravenous, Continuous, Belva Crome, MD .  0.9 %  sodium chloride infusion, 250 mL, Intravenous, PRN, Belva Crome, MD, Last Rate: 10 mL/hr at 12/12/18 2130, 500 mL at 12/12/18 2130 .  acetaminophen (TYLENOL) tablet 650 mg, 650 mg, Oral, Q4H PRN, Belva Crome, MD, 650 mg at 12/12/18 2322 .  ALPRAZolam (XANAX) tablet 0.25 mg, 0.25 mg, Oral, BID PRN, Barrett, Rhonda G, PA-C .  aspirin chewable tablet 81 mg, 81 mg, Per Tube, Daily, Belva Crome, MD .  atorvastatin (LIPITOR) tablet 80 mg, 80 mg, Per Tube, q1800, Belva Crome, MD .  bisacodyl (DULCOLAX) suppository 10 mg, 10 mg, Rectal, Daily PRN, Belva Crome, MD .  budesonide (PULMICORT) nebulizer  solution 0.5 mg, 0.5 mg, Nebulization, BID, Belva Crome, MD, 0.5 mg at 12/13/18 5830 .  chlorhexidine gluconate (MEDLINE KIT) (PERIDEX) 0.12 % solution 15 mL, 15 mL, Mouth Rinse, BID, Belva Crome, MD, 15 mL at 12/12/18 2038 .  Chlorhexidine Gluconate Cloth 2 % PADS 6 each, 6 each, Topical, Daily, Belva Crome, MD, 6 each at 12/12/18 1312 .  fentaNYL (SUBLIMAZE) bolus via infusion 50 mcg, 50 mcg, Intravenous, Q15 min PRN, Bowser, Laurel Dimmer, NP, 50 mcg at 12/13/18 0530 .  fentaNYL 2550mg in NS 2569m(1072mml) infusion-PREMIX, 50-200 mcg/hr, Intravenous, Continuous, Bowser, Grace E, NP, Last Rate: 15 mL/hr at 12/13/18 0300, 150 mcg/hr at 12/13/18 0300 .  heparin injection 5,000 Units, 5,000 Units, Subcutaneous, Q8H, JorMartiniqueeter M, MD, 5,000 Units at 12/13/18 [phone] ipratropium-albuterol (DUONEB) 0.5-2.5 (3) MG/3ML nebulizer solution 3 mL, 3 mL, Nebulization, Q6H, Bowser, Grace E, NP, 3 mL at 12/13/18 0721 .  MEDLINE mouth rinse, 15 mL, Mouth Rinse, 10 times per day, SmiBelva CromeD, 15 mL at 12/13/18 0636 .  methylPREDNISolone sodium succinate (SOLU-MEDROL) 125 mg/2 mL injection 60 mg, 60 mg, Intravenous, Q6H, Bowser, Grace E, NP, 60 mg at 12/13/18 0629 .  midazolam (VERSED) injection 2 mg, 2 mg, Intravenous, Q15 min PRN, Bowser, Grace E, NP .  midazolam (VERSED) injection 2 mg, 2 mg, Intravenous, Q2H PRN, Bowser, GraLaurel DimmerP, 2 mg at 12/13/18 0141 .  nitroGLYCERIN (NITROSTAT) SL tablet 0.4  mg, 0.4 mg, Sublingual, Q5 Min x 3 PRN, Barrett, Rhonda G, PA-C .  ondansetron (ZOFRAN) injection 4 mg, 4 mg, Intravenous, Q6H PRN, Belva Crome, MD .  pantoprazole (PROTONIX) injection 40 mg, 40 mg, Intravenous, Daily, Bowser, Grace E, NP, 40 mg at 12/12/18 1207 .  potassium chloride (KLOR-CON) packet 30 mEq, 30 mEq, Per Tube, BID, Agyei, Obed K, MD .  sennosides (SENOKOT) 8.8 MG/5ML syrup 5 mL, 5 mL, Per Tube, BID PRN, Belva Crome, MD .  sodium chloride flush (NS) 0.9 % injection 3 mL, 3 mL,  Intravenous, Q12H, Belva Crome, MD, 3 mL at 12/12/18 2228 .  sodium chloride flush (NS) 0.9 % injection 3 mL, 3 mL, Intravenous, PRN, Belva Crome, MD .  ticagrelor [facility]) tablet 90 mg, 90 mg, Per Tube, BID, Martinique, Peter M, MD .  zolpidem (AMBIEN) tablet 5 mg, 5 mg, Oral, QHS PRN, Lonn Georgia, PA-C Labs CBC    Component Value Date/Time   WBC 14.8 (H) 12/13/2018 0429   RBC 5.17 (H) 12/13/2018 0429   HGB 14.2 12/13/2018 0429   HCT 45.1 12/13/2018 0429   PLT 344 12/13/2018 0429   MCV 87.2 12/13/2018 0429   MCH 27.5 12/13/2018 0429   MCHC 31.5 12/13/2018 0429   RDW 14.1 12/13/2018 0429   LYMPHSABS 1.2 12/12/2018 0657   MONOABS 0.7 12/12/2018 0657   EOSABS 0.1 12/12/2018 0657   BASOSABS 0.0 12/12/2018 0657    CMP     Component Value Date/Time   NA 141 12/13/2018 0429   K 3.1 (L) 12/13/2018 0429   CL 107 12/13/2018 0429   CO2 22 12/13/2018 0429   GLUCOSE 162 (H) 12/13/2018 0429   BUN 26 (H) 12/13/2018 0429   CREATININE 1.32 (H) 12/13/2018 0429   CALCIUM 8.7 (L) 12/13/2018 0429   PROT 6.2 (L) 12/13/2018 0429   ALBUMIN 3.0 (L) 12/13/2018 0429   AST 219 (H) 12/13/2018 0429   ALT 69 (H) 12/13/2018 0429   ALKPHOS 87 12/13/2018 0429   BILITOT 0.2 (L) 12/13/2018 0429   GFRNONAA 43 (L) 12/13/2018 0429   GFRAA 50 (L) 12/13/2018 0429     Imaging I have reviewed images in epic and the results pertinent to this consultation are: CT of the head with no evidence of bleed or acute process.  Assessment: 63 year old with sudden onset of altered mental status on the cath table and noted to have increased PCO2.  Likely CO2 narcosis. CT head negative for acute process including ICH. Clinical exam reassuring and near normal with the exception of patient being intubated.  Currently being weaned to extubate.  Impression: Toxic metabolic encephalopathy-CO2 narcosis-resolved  Recommendations:  At this time, no new neurological recommendations Cardiac management per primary  team. Please call with questions.    -- Amie Portland, MD [facility] Pager: [phone] If 7pm to 7am, please call on call as listed on AMION.

## 2018-12-13 NOTE — TOC Benefit Eligibility Note (Signed)
Transition of Care [facility]) Benefit Eligibility Note    Patient Details  Name: Chelsea Allison MRN: [id number] Date of Birth: 1956-01-04   Medication/Dose: Kary Kos) tablet 90 mg  :  Dose 90 mg  :  Per Tube  :  2 times daily  : thanks  Covered?: Yes  Tier: 3 Drug  Prescription Coverage Preferred Pharmacy: Any retail Pharmacy  Spoke with Person/Company/Phone Number:: [email or web address]  Co-Pay: 3.90 for a 30 day supply  Prior Approval: No  Deductible: Met(310.00 met)       Chelsea Allison Phone Number: 12/13/2018, 1:32 PM

## 2018-12-13 NOTE — Progress Notes (Signed)
Placed patient on wean mode this morning, will continue to monitor patient.

## 2018-12-13 NOTE — Plan of Care (Signed)
  Problem: Cardiac: Goal: Ability to achieve and maintain adequate cardiopulmonary perfusion will improve Outcome: Progressing Goal: Vascular access site(s) Level 0-1 will be maintained Outcome: Progressing   Problem: Clinical Measurements: Goal: Ability to maintain clinical measurements within normal limits will improve Outcome: Progressing Goal: Will remain free from infection Outcome: Progressing Goal: Respiratory complications will improve Outcome: Progressing   Problem: Coping: Goal: Level of anxiety will decrease Outcome: Progressing   Problem: Pain Managment: Goal: General experience of comfort will improve Outcome: Progressing   Problem: Safety: Goal: Ability to remain free from injury will improve Outcome: Progressing   Problem: Skin Integrity: Goal: Risk for impaired skin integrity will decrease Outcome: Progressing

## 2018-12-13 NOTE — Progress Notes (Signed)
New fentanyl bag hung, wasted 5 mL of Fentanyl with Jarrett Soho RN from old bag.

## 2018-12-13 NOTE — Progress Notes (Signed)
Patient woke up from a nap and sat on the side of the bed. She called me into room saying she couldn't breathe. On assessment patient was very short of breath and unable to speak full sentences. O2 sats were 93% on 3L and lungs had exp. wheezes on assessment. Patient very anxious. Respiratory and MD came to bedside to assess patient. NEB frequency increased and patient took home rescue inhaler per MD. Patient finally calmed down and became less short of breath and stopped wheezing.

## 2018-12-13 NOTE — Progress Notes (Signed)
NAME:  Chelsea Allison, MRN:  [id number], DOB:  [birth year], LOS: 1 ADMISSION DATE:  12/12/2018, CONSULTATION DATE: 12/12/2018 REFERRING MD: Linard Millers, MD, CHIEF COMPLAINT: ST elevation MI, respiratory failure  Brief History   63 year old with severe COPD, lung cancer status post resection, active smoker Admitted with chest elevation MI.  Decompensated in the Cath Lab with hypercarbia and intubated.  PCCM consulted for vent management.  Past Medical History  Severe COPD, prior partial right lung resection for lung cancer 2015, history of colon cancer with nephrostomy.   She has severe COPD, lung cancer on Breo inhaler.  Follows at [facility] pulmonary.  Current active smoker.  Lung cancer was treated with chemotherapy, right upper lobectomy in 2015.  PET scan at [facility] April 2020 shows no evidence of recurrence.  Extubated during the day 10/6 and initially tolerated fine.  Then called back overnight for increased WOB, agitation, anxiety, AMS.  Significant Hospital Events   10/5-Admit with MI, status post cath, intra-aortic balloon pump 10/6 > extubated.  Called back later that night for respiratory distress.  Consults:  PCCM  Procedures:  Endotracheal tube 10/5 > 10/6  Significant Diagnostic Tests:  Blood 10/6 >  Sputum 10/6 >  SARS CoV2 10/5 > neg.  Micro Data:    Antimicrobials:  Zosyn 10/6 >   Interim history/subjective:  Had AMS and distress earlier this evening, placed on BiPAP. Now improvement in mental status but remains with mildly increased WOB.  Also very anxious and agitated. CXR with concern for developing RUL infiltrate. ABG with resp acidosis.  Objective   Blood pressure 121/88, pulse (!) 147, temperature 98.4 F (36.9 C), temperature source Oral, resp. rate (!) 33, height 5\' 8"  (1.727 m), weight 89.6 kg, SpO2 99 %. PAP: (24-48)/(12-25) 41/20 CVP:  [4 mmHg-10 mmHg] 5 mmHg CO:  [4.6 L/min-5.8 L/min] 5.8 L/min CI:  [2.3 L/min/m2-2.9 L/min/m2] 2.9 L/min/m2   Vent Mode: PSV;CPAP FiO2 (%):  [40 %] 40 % Set Rate:  [24 bmp] 24 bmp Vt Set:  [510 mL] 510 mL PEEP:  [5 cmH20] 5 cmH20 Pressure Support:  [10 cmH20] 10 cmH20 Plateau Pressure:  [15 cmH20-20 cmH20] 15 cmH20   Intake/Output Summary (Last 24 hours) at 12/13/2018 2124 Last data filed at 12/13/2018 1500 Gross per 24 hour  Intake 1069.14 ml  Output 1050 ml  Net 19.14 ml   Filed Weights   12/12/18 0714 12/12/18 1100 12/13/18 0600  Weight: 86 kg 86 kg 89.6 kg    Examination: General: Adult female, agitated and restless, in NAD. Neuro: Awake, agitated and restless.  MAE's. HEENT: [place]/AT. Sclerae anicteric. EOMI.  BiPAP in place. Cardiovascular: Tachy, regular, no M/R/G.  Lungs: Respirations mildly labored.  Diffuse wheezes with crackles in bases. Abdomen: BS x 4, soft, NT/ND.  Musculoskeletal: No gross deformities, no edema.  Skin: Intact, warm, no rashes.  Assessment & Plan:   Hypercarbic, hypoxic respiratory failure in the setting of severe COPD, MI. History of lung cancer status post resection, active smoker. - Continue BiPAP. - Repeat ABG at 2215, if no improvement then will likely require re-intubation. - Ativan 1-2 mg q4hrs anxiety / agitation. - Morphine 1mg  for air hunger / distress. - 125mg  solumedrol x 1 then start 40mg  daily 10/7. - Xopenex q3hr PRN. - Lasix 40mg  x 1. - Continue IS and flutter valve. - Tobacco cessation counseling.  Concern for developing RUL infiltrate - ? Aspiration. - Start zosyn and obtain sputum cultures. - Follow CXR.  STEMI Cardiogenic shock -  resolved - Per cardiology.   Additional CC time: 30 min.   Montey Hora, [place] [facility] Pager: [phone].  If no answer, [phone] 12/13/2018, 9:35 PM

## 2018-12-13 NOTE — Progress Notes (Signed)
PT Cancellation Note  Patient Details Name: Chelsea Allison MRN: [id number] DOB: 08/29/1955   Cancelled Treatment:    Reason Eval/Treat Not Completed: Fatigue/lethargy limiting ability to participate - Pt checked on at 1400, pt just got back to bed and was dyspneic and fatigued. At 1610, pt sleeping and RN requests PT check back tomorrow. Will continue to follow acutely.   Julien Girt, PT Acute Rehabilitation Services Pager [phone]  Office [phone]   [place] 12/13/2018, 4:17 PM

## 2018-12-13 NOTE — Progress Notes (Signed)
NAME:  Chelsea Allison, MRN:  [id number], DOB:  07-07-1955, LOS: 1 ADMISSION DATE:  12/12/2018, CONSULTATION DATE: 12/12/2018 REFERRING MD: Linard Millers, MD, CHIEF COMPLAINT: ST elevation MI, respiratory failure  Brief History   62 year old with severe COPD, lung cancer status post resection, active smoker Admitted with chest elevation MI.  Decompensated in the Cath Lab with hypercarbia and intubated.  PCCM consulted for vent management.  Past Medical History  Severe COPD, prior partial right lung resection for lung cancer 2015, history of colon cancer with nephrostomy.   She has severe COPD, lung cancer on Breo inhaler.  Follows at [facility] pulmonary.  Current active smoker.  Lung cancer was treated with chemotherapy, right upper lobectomy in 2015.  PET scan at [facility] April 2020 shows no evidence of recurrence.  Significant Hospital Events   10/5-Admit with MI, status post cath, intra-aortic balloon pump  Consults:  PCCM  Procedures:  Endotracheal tube 10/5 >  Significant Diagnostic Tests:    Micro Data:    Antimicrobials:    Interim history/subjective:  No events overnight   Objective   Blood pressure (!) 125/115, pulse (!) 107, temperature 98.8 F (37.1 C), temperature source Core, resp. rate (!) 25, height 5\' 8"  (1.727 m), weight 89.6 kg, SpO2 100 %. PAP: (23-48)/(12-25) 48/25 CVP:  [4 mmHg-15 mmHg] 10 mmHg CO:  [3.4 L/min-5.8 L/min] 5.8 L/min CI:  [1.7 L/min/m2-2.9 L/min/m2] 2.9 L/min/m2  Vent Mode: PSV;CPAP FiO2 (%):  [40 %-80 %] 40 % Set Rate:  [24 bmp] 24 bmp Vt Set:  [510 mL-550 mL] 510 mL PEEP:  [5 cmH20] 5 cmH20 Pressure Support:  [10 cmH20] 10 cmH20 Plateau Pressure:  [15 cmH20-24 cmH20] 15 cmH20   Intake/Output Summary (Last 24 hours) at 12/13/2018 0856 Last data filed at 12/13/2018 0800 Gross per 24 hour  Intake 1718.41 ml  Output 1150 ml  Net 568.41 ml   Filed Weights   12/12/18 0714 12/12/18 1100 12/13/18 0600  Weight: 86 kg 86 kg 89.6 kg     Examination: Gen:      Well appearing, NAD HEENT:  [place]/AT, PERRL, EOM-I and MMM, ETT in place Lungs:    CTA bilaterally CV:         RRR, Nl S1/S2 and -M/R/G Abd:      Soft, NT, ND and +BS Ext:    No edema; adequate peripheral perfusion Skin:      Warm and dry; no rash Neuro:   Awake and interactive, moving all ext to command  I reviewed CXR myself, ETT is in a good position  Resolved Hospital Problem list     Assessment & Plan:  Hypercarbic, hypoxic respiratory failure in the setting of severe COPD, MI History of lung cancer status post resection, active smoker. Extubate today Titrate O2 for sat of 88-92% D/C further ABG IS and flutter valve Ambulate D/C solumedrol BD as ordered  STEMI, cardiogenic shock D/C IABP D/C pressors May need to consider restarting home anti-HTN upon extubation  Evaluated post extubation, appears well, PCCM will sign off, please call back if needed  Labs   CBC: Recent Labs  Lab 12/12/18 0657  12/12/18 0843 12/12/18 1009 12/12/18 1131 12/12/18 2240 12/13/18 0429  WBC 12.8*  --   --   --   --  15.0* 14.8*  NEUTROABS 10.7*  --   --   --   --   --   --   HGB 16.8*   < > 17.3*  17.3* 18.4* 18.0* 15.1*  14.2  HCT 53.9*   < > 51.0*  51.0* 54.0* 53.0* 46.7* 45.1  MCV 90.4  --   --   --   --  87.6 87.2  PLT 327  --   --   --   --  319 344   < > = values in this interval not displayed.    Basic Metabolic Panel: Recent Labs  Lab 12/12/18 0729 12/12/18 0828 12/12/18 0843 12/12/18 1009 12/12/18 1131 12/13/18 0429  NA 144 142 140  141 141 141 141  K 3.4* 3.7 3.6  3.6 3.4* 3.6 3.1*  CL 100  --   --   --   --  107  CO2  --   --   --   --   --  22  GLUCOSE 124*  --   --   --   --  162*  BUN 22  --   --   --   --  26*  CREATININE 0.90  --   --   --   --  1.32*  CALCIUM  --   --   --   --   --  8.7*   GFR: Estimated Creatinine Clearance: 51.8 mL/min (A) (by C-G formula based on SCr of 1.32 mg/dL (H)). Recent Labs  Lab 12/12/18 0657  12/12/18 2240 12/13/18 0429  WBC 12.8* 15.0* 14.8*    Liver Function Tests: Recent Labs  Lab 12/13/18 0429  AST 219*  ALT 69*  ALKPHOS 87  BILITOT 0.2*  PROT 6.2*  ALBUMIN 3.0*   No results for input(s): LIPASE, AMYLASE in the last 168 hours. No results for input(s): AMMONIA in the last 168 hours.  ABG    Component Value Date/Time   PHART 7.290 (L) 12/12/2018 1131   PCO2ART 51.0 (H) 12/12/2018 1131   PO2ART 236.0 (H) 12/12/2018 1131   HCO3 24.8 12/12/2018 1131   TCO2 26 12/12/2018 1131   ACIDBASEDEF 3.0 (H) 12/12/2018 1131   O2SAT 70.1 12/12/2018 1157     Coagulation Profile: Recent Labs  Lab 12/12/18 0657  INR 1.0    Cardiac Enzymes: No results for input(s): CKTOTAL, CKMB, CKMBINDEX, TROPONINI in the last 168 hours.  HbA1C: Hgb A1c MFr Bld  Date/Time Value Ref Range Status  12/12/2018 11:32 AM 5.4 4.8 - 5.6 % Final    Comment:    (NOTE) Pre diabetes:          5.7%-6.4% Diabetes:              >6.4% Glycemic control for   <7.0% adults with diabetes     CBG: Recent Labs  Lab 12/12/18 0753  GLUCAP 119*   Doing well post extubation  The patient is critically ill with multiple organ systems failure and requires high complexity decision making for assessment and support, frequent evaluation and titration of therapies, application of advanced monitoring technologies and extensive interpretation of multiple databases.   Critical Care Time devoted to patient care services described in this note is  32  Minutes. This time reflects time of care of this signee Dr Jennet Maduro. This critical care time does not reflect procedure time, or teaching time or supervisory time of PA/NP/Med student/Med Resident etc but could involve care discussion time.  Rush Farmer, M.D. [facility] Pulmonary/Critical Care Medicine. Pager: [phone]. After hours pager: [phone].

## 2018-12-13 NOTE — Care Management (Signed)
1504 12-13-18 Brilinta 30 day free card provided to patient. No further needs at this time. Bethena Roys, RN,BSN Case Manager [phone]

## 2018-12-13 NOTE — Progress Notes (Signed)
Patient became severely short of breath with decrease in Sp02 levels. Patient given nebulizer treatment and started on bipap. Nurse at bedside and doctor is aware of situation. Will obtain arterial blood gas after 30 minutes of bipap.

## 2018-12-13 NOTE — Progress Notes (Addendum)
Approx 2030-2230  At beginning of shift pt was alert, oriented, recognized this RN from last night. Pt refused 2000 scheduled breathing tx. Approx 2020 pt requests to get up to [facility]. RN into room to assist. Even before pt was assisted to side of bed she became anxious, c/o not being able to breathe, requests to use home rescue inhaler, educated pt on importance of using hospital meds/tx and plan to page RT for tx pt refused. Pt to edge of bed, became worked up, increasingly anxious.   RT and CN to bedside. Pt became disoriented, hypoxic, lethargic, tachycardic (140s) and hypertensive. RT placed pt on BiPAP, 2122 ABG shows respiratory acidosis.   Dr Radford Pax, cardiology fellow on call paged, 12 lead EKG, chest xray ordered. Will contact CCM for assistance.  eICU called via in room box/camera, spoke to Anna Maria who paged MD . Dr Janann Colonel called, pt condition discussed and Ground team activated and to bedside.   Pt responded well to interventions with improved HR, BP, sats and mentation. Able to follow commands, calm and cooperative.   - BiPAP - Ativan 1 mg IV - Morphine 1 mg IV - Lasix 40 mg IV  Plan also includes  - Solumedrol IV x 1 - Blood Cultures, Sputum culture - Zosyn IV - change albuterol to xopenex

## 2018-12-13 NOTE — Care Management (Signed)
1309 12-13-18 Benefits check submitted for Brilinta. CM will make patient aware of cost once completed. Bethena Roys, RN,BSN Case Manager [phone]

## 2018-12-13 NOTE — Progress Notes (Signed)
Progress Note  Patient Name: Primrose Oler Date of Encounter: 12/13/2018  Primary Cardiologist: Sinclair Grooms, MD   Subjective   Ms. Rosten was resting comfortably in bed today.  She reports that she is feeling well and only has very minimal chest pain otherwise she denies shortness of breath, headaches or lightheadedness.  Also complains of throat feeling uncomfortable because of the endotracheal tube.  Inpatient Medications    Scheduled Meds:  aspirin  81 mg Per Tube Daily   atorvastatin  80 mg Per Tube q1800   budesonide (PULMICORT) nebulizer solution  0.5 mg Nebulization BID   chlorhexidine gluconate (MEDLINE KIT)  15 mL Mouth Rinse BID   Chlorhexidine Gluconate Cloth  6 each Topical Daily   heparin  5,000 Units Subcutaneous Q8H   ipratropium-albuterol  3 mL Nebulization Q6H   mouth rinse  15 mL Mouth Rinse 10 times per day   methylPREDNISolone (SOLU-MEDROL) injection  60 mg Intravenous Q6H   pantoprazole (PROTONIX) IV  40 mg Intravenous Daily   sodium chloride flush  3 mL Intravenous Q12H   ticagrelor  90 mg Per Tube BID   Continuous Infusions:  sodium chloride     sodium chloride 500 mL (12/12/18 2130)   fentaNYL infusion INTRAVENOUS 150 mcg/hr (12/13/18 0300)   PRN Meds: sodium chloride, acetaminophen, ALPRAZolam, bisacodyl, fentaNYL, midazolam, midazolam, nitroGLYCERIN, ondansetron (ZOFRAN) IV, sennosides, sodium chloride flush, zolpidem   Vital Signs    Vitals:   12/13/18 0353 12/13/18 0400 12/13/18 0500 12/13/18 0600  BP:  104/62 99/63 105/64  Pulse:  93 85 87  Resp:  (!) 25 (!) 30 (!) 29  Temp:  98.8 F (37.1 C) 98.8 F (37.1 C) 98.6 F (37 C)  TempSrc:      SpO2: 100% 100% 100% 99%  Weight:      Height:        Intake/Output Summary (Last 24 hours) at 12/13/2018 0620 Last data filed at 12/13/2018 0400 Gross per 24 hour  Intake 1360.16 ml  Output 1150 ml  Net 210.16 ml   Filed Weights   12/12/18 0714 12/12/18 1100  Weight: 86 kg  86 kg    Telemetry    Several runs of nonsustained V. tach- Personally Reviewed  ECG    Normal sinus with prolonged QT- Personally Reviewed  Physical Exam   GEN: No acute distress.   Cardiac: RRR, no murmurs, rubs, or gallops.  Respiratory:  Intubated, very minimal wheezing anteriorly GI: Soft, nontender, non-distended, colostomy bag in place MS: No edema; No deformity. Neuro:  Nonfocal  Psych: Normal affect   Labs    Chemistry Recent Labs  Lab 12/12/18 0729  12/12/18 1009 12/12/18 1131 12/13/18 0429  NA 144   < > 141 141 141  K 3.4*   < > 3.4* 3.6 3.1*  CL 100  --   --   --  107  CO2  --   --   --   --  22  GLUCOSE 124*  --   --   --  162*  BUN 22  --   --   --  26*  CREATININE 0.90  --   --   --  1.32*  CALCIUM  --   --   --   --  8.7*  PROT  --   --   --   --  6.2*  ALBUMIN  --   --   --   --  3.0*  AST  --   --   --   --  219*  ALT  --   --   --   --  69*  ALKPHOS  --   --   --   --  87  BILITOT  --   --   --   --  0.2*  GFRNONAA  --   --   --   --  43*  GFRAA  --   --   --   --  50*  ANIONGAP  --   --   --   --  12   < > = values in this interval not displayed.     Hematology Recent Labs  Lab 12/12/18 0657  12/12/18 1131 12/12/18 2240 12/13/18 0429  WBC 12.8*  --   --  15.0* 14.8*  RBC 5.96*  --   --  5.33* 5.17*  HGB 16.8*   < > 18.0* 15.1* 14.2  HCT 53.9*   < > 53.0* 46.7* 45.1  MCV 90.4  --   --  87.6 87.2  MCH 28.2  --   --  28.3 27.5  MCHC 31.2  --   --  32.3 31.5  RDW 14.1  --   --  14.1 14.1  PLT 327  --   --  319 344   < > = values in this interval not displayed.    Cardiac EnzymesNo results for input(s): TROPONINI in the last 168 hours. No results for input(s): TROPIPOC in the last 168 hours.   BNP Recent Labs  Lab 12/12/18 1132  BNP 248.0*     DDimer No results for input(s): DDIMER in the last 168 hours.   Radiology    Dg Abd 1 View  Result Date: 12/12/2018 CLINICAL DATA:  OG tube placement. EXAM: ABDOMEN - 1 VIEW  COMPARISON:  None. FINDINGS: An OG tube is noted with tip overlying the mid stomach. IMPRESSION: OG tube with tip overlying the mid stomach. Electronically Signed   By: Margarette Canada M.D.   On: 12/12/2018 19:13   Ct Head Wo Contrast  Result Date: 12/12/2018 CLINICAL DATA:  63 year old female with altered mental status. Presented with acute myocardial infarction and cardiac catheterization. EXAM: CT HEAD WITHOUT CONTRAST TECHNIQUE: Contiguous axial images were obtained from the base of the skull through the vertex without intravenous contrast. COMPARISON:  01/08/2017 MR FINDINGS: Brain: No evidence of acute infarction, hemorrhage, hydrocephalus, extra-axial collection or mass lesion/mass effect. Very mild chronic small-vessel white matter ischemic changes again noted. Vascular: Carotid atherosclerotic calcifications noted. Skull: Normal. Negative for fracture or focal lesion. Sinuses/Orbits: No acute abnormality. Chronic mucosal thickening within sphenoid sinuses noted. Other: None IMPRESSION: 1. No evidence of acute intracranial abnormality. 2. Mild chronic small-vessel white matter ischemic changes. Electronically Signed   By: Margarette Canada M.D.   On: 12/12/2018 14:56   Dg Chest Port 1 View  Result Date: 12/12/2018 CLINICAL DATA:  Chest pain. History of STEMI December 12, 2018. History of colon cancer and lung cancer. EXAM: PORTABLE CHEST 1 VIEW COMPARISON:  November 18, 2016 FINDINGS: Fibrotic changes are seen in the right suprahilar region, more masslike in appearance compared to November 18, 2016. Pleural thickening is seen in the right apex, likely post therapeutic. Mild opacity is seen in the right mid lung, peripherally. Mild increased interstitial markings in the upper lobes. The heart, left hilum, and mediastinum are unchanged. An ETT is in good position. An intra aortic balloon pump terminates 7.5 cm below the thoracic aortic arch. The distal tip of a PA catheter from  an inferior approach projects over  the left hilum. IMPRESSION: 1. The ETT is in good position. 2. An intra-aortic balloon pump appears to have been placed, terminating 7.5 cm below the top of the aortic arch. 3. The PA catheter approaches via the inferior vena cava with the distal tip projected over the proximal outflow tract. 4. Increased interstitial markings in the apices may represent mild pulmonary venous congestion. 5. Increasing masslike opacity in the right suprahilar region. Fibrotic change was seen in this region November 18, 2016. The increased fullness could represent recurrence or evolution of postradiation change. 6. More focal opacity is seen in the periphery of the right mid lung, possibly infectious. Electronically Signed   By: Dorise Bullion III M.D   On: 12/12/2018 11:25    Cardiac Studies   Echocardiogram December 12, 2018 1. Technically difficult study with limited views. Left ventricular ejection fraction, by visual estimation, is 35 to 40%. Hypokinesis of apex, anterior/septal/lateral apical walls, mid to basal inferolateral wall  2. Global right ventricle has normal systolic function.The right ventricular size is normal.  FINDINGS  Left Ventricle: Left ventricular ejection fraction, by visual estimation, is 35 to 40%. The left ventricle has moderately decreased function. There is mildly increased left ventricular hypertrophy. Concentric left ventricular hypertrophy. Spectral  Doppler shows Left ventricular diastolic Doppler parameters are consistent with impaired relaxation pattern of LV diastolic filling.  Right Ventricle: The right ventricular size is normal. No increase in right ventricular wall thickness. Global RV systolic function is has normal systolic function.  Left Atrium: Left atrial size was normal in size.  Right Atrium: Right atrial size was not well visualized  Pericardium: The pericardium was not well visualized.  Mitral Valve: The mitral valve was not well visualized. Not assessed  mitral valve regurgitation.  Tricuspid Valve: The tricuspid valve is not well visualized. Tricuspid valve regurgitation was not assessed by color flow Doppler.  Aortic Valve: The aortic valve was not well visualized. Aortic valve regurgitation was not assessed by color flow Doppler.  Pulmonic Valve: The pulmonic valve was not well visualized. Pulmonic valve regurgitation was not assessed by color flow Doppler.  Aorta: The aortic root was not well visualized.  IAS/Shunts: The interatrial septum was not well visualized.   December 12, 2018-Left heart cath and coronary angiography   Acute lateral wall myocardial infarction due to thrombotic occlusion of the circumflex.  High-grade obstruction of the proximal RCA with collaterals to the apical LAD  Chronic total occlusion of the proximal LAD.  PTCA and stent implantation proximal to mid circumflex reducing 100% stenosis to less than 10% with TIMI grade III flow.  Final post dual balloon diameter 2.83 mm using A 22 x 2.75 Onyx.  No postdilatation was performed.  Mid anterior wall akinesis with anteroapical hypokinesis.  LVEDP 25 mmHg.  Mean pulmonary capillary wedge pressure 7 mmHg, PA pressure 24 over 12 mmHg.  Intra-aortic balloon pump at 1:1 pumping.  Altered mental status prior to intubation.  Etiology is unclear but likely related to CO2 narcosis.  Rule out intracranial hemorrhage/bleed in the setting of intense anticoagulation.  Patient Profile     63 y.o. female with medical history significant for COPD, lung cancer status post pneumonectomy, tobacco use disorder, colon cancer with colostomy who presented to [facility], ED on December 12, 2018 with chest pain found to have STEMI found to have high-grade obstruction of proximal RCA status post PTCA and stent implantation of proximal to mid circumflex.  During her coronary intervention,  she developed acute encephalopathy and hypercarbia with PCO2 of 81 which warranted emergent  intubation.  Due to hypoxia and elevated left ventricular end-diastolic pressure of 28 mm per mercury, and intra-aortic balloon pump was inserted.  Assessment & Plan    #STEMI: Found to have an acute lateral wall myocardial infarction due to thrombotic occlusion of the circumflex.  Found to have a high-grade obstruction of proximal RCA and is status post PTCA and stent implantation of the proximal to mid circumflex. -Continue aspirin, Lipitor, Brilinta  #Systolic heart failure: Echocardiogram reviewed the ventricle EF of 35-45% with hypokinesis of the apex, anterior/septal/lateral apical walls, basal inferior lateral wall.  She has no signs of volume overload on physical exam.  Checks x-ray with no evidence of vascular congestion.  Her co-oximetry yesterday was 70.1% - Coreg 3.125 mg twice a day  #Acute encephalopathy secondary to hypercarbia During her coronary intervention, she became altered, hypoxic and unresponsive to noxious stimuli.  Stat ABG revealed respiratory acidosis with PCO2 of 81 and was emergently intubated.  There was concern for possible intracranial hemorrhage however CT of the head was unremarkable.  She did not have any focal neurological deficits on physical exams once she was transferred to the cardiac ICU.    #Hypercarbic/hypoxic respiratory failure: #COPD Remains on mechanical ventilation support with plan to extubate today -Continue nebulizers and Pulmicort -Continue Solu-Medrol  #Hypokalemia: K+ of 3.1. Replete with potassium chloride 30 mEq x 2 doses  For questions or updates, please contact [place] Please consult [email or web address] for contact info under Cardiology/STEMI.      Signed, Jean Rosenthal, MD  12/13/2018, 6:20 AM

## 2018-12-14 DIAGNOSIS — J810 Acute pulmonary edema: Secondary | ICD-10-CM

## 2018-12-14 DIAGNOSIS — J441 Chronic obstructive pulmonary disease with (acute) exacerbation: Secondary | ICD-10-CM

## 2018-12-14 LAB — EXPECTORATED SPUTUM ASSESSMENT W GRAM STAIN, RFLX TO RESP C

## 2018-12-14 LAB — CBC
HCT: 49.8 % — ABNORMAL HIGH (ref 36.0–46.0)
Hemoglobin: 15.2 g/dL — ABNORMAL HIGH (ref 12.0–15.0)
MCH: 27.2 pg (ref 26.0–34.0)
MCHC: 30.5 g/dL (ref 30.0–36.0)
MCV: 89.2 fL (ref 80.0–100.0)
Platelets: 299 10*3/uL (ref 150–400)
RBC: 5.58 MIL/uL — ABNORMAL HIGH (ref 3.87–5.11)
RDW: 14.6 % (ref 11.5–15.5)
WBC: 20.5 10*3/uL — ABNORMAL HIGH (ref 4.0–10.5)
nRBC: 0 % (ref 0.0–0.2)

## 2018-12-14 LAB — BASIC METABOLIC PANEL
Anion gap: 10 (ref 5–15)
BUN: 29 mg/dL — ABNORMAL HIGH (ref 8–23)
CO2: 24 mmol/L (ref 22–32)
Calcium: 8.9 mg/dL (ref 8.9–10.3)
Chloride: 106 mmol/L (ref 98–111)
Creatinine, Ser: 1.08 mg/dL — ABNORMAL HIGH (ref 0.44–1.00)
GFR calc Af Amer: >60 mL/min (ref >60)
GFR calc non Af Amer: 55 mL/min — ABNORMAL LOW (ref >60)
Glucose, Bld: 140 mg/dL — ABNORMAL HIGH (ref 70–99)
Potassium: 3.8 mmol/L (ref 3.5–5.1)
Sodium: 140 mmol/L (ref 135–145)

## 2018-12-14 LAB — MAGNESIUM: Magnesium: 2.1 mg/dL (ref 1.7–2.4)

## 2018-12-14 LAB — PROCALCITONIN: Procalcitonin: 0.48 ng/mL

## 2018-12-14 MED ORDER — ORAL CARE MOUTH RINSE
15.0000 mL | Freq: Two times a day (BID) | OROMUCOSAL | Status: DC
  Administered 2018-12-14: 12:00:00 15 mL via OROMUCOSAL

## 2018-12-14 MED ORDER — FUROSEMIDE 10 MG/ML IJ SOLN
40.0000 mg | Freq: Once | INTRAMUSCULAR | Status: AC
  Administered 2018-12-14: 16:00:00 40 mg via INTRAVENOUS
  Filled 2018-12-14: qty 4

## 2018-12-14 MED ORDER — CHLORHEXIDINE GLUCONATE 0.12 % MT SOLN
15.0000 mL | Freq: Two times a day (BID) | OROMUCOSAL | Status: DC
  Administered 2018-12-14 – 2018-12-15 (×3): 15 mL via OROMUCOSAL
  Filled 2018-12-14 (×2): qty 15

## 2018-12-14 MED ORDER — TICAGRELOR 90 MG PO TABS
90.0000 mg | ORAL_TABLET | Freq: Two times a day (BID) | ORAL | Status: DC
  Administered 2018-12-14 – 2018-12-20 (×12): 90 mg via ORAL
  Filled 2018-12-14 (×12): qty 1

## 2018-12-14 MED ORDER — PANTOPRAZOLE SODIUM 40 MG PO PACK: 40.0000 mg | PACK | Freq: Every day | ORAL | Status: DC

## 2018-12-14 MED ORDER — PANTOPRAZOLE SODIUM 40 MG PO TBEC
40.0000 mg | DELAYED_RELEASE_TABLET | Freq: Every day | ORAL | Status: DC
  Administered 2018-12-15: 40 mg via ORAL
  Filled 2018-12-14: qty 1

## 2018-12-14 MED ORDER — ATORVASTATIN CALCIUM 80 MG PO TABS
80.0000 mg | ORAL_TABLET | Freq: Every day | ORAL | Status: DC
  Filled 2018-12-14: qty 1

## 2018-12-14 NOTE — Progress Notes (Addendum)
Progress Note  Patient Name: Chelsea Allison Date of Encounter: 12/14/2018  Primary Cardiologist: Sinclair Grooms, MD   Subjective  Overnight: She was noted to be experiencing increased work of breathing with hypoxia.  Also noted to be lethargic and tachycardic to the 140s.  She was given a nebulizer treatment and also started on BiPAP.  Critical care evaluated her at bedside who administered Ativan, morphine, IV Lasix 40 mg.  She was also started on IV Zosyn for possible right upper lobe pneumonia concerning for aspiration pneumonia.  She reports of being tired from her uneventful night.  She is still on the BiPAP but however denies chest pain  Inpatient Medications    Scheduled Meds:  aspirin  81 mg Per Tube Daily   atorvastatin  80 mg Per Tube q1800   bisoprolol  5 mg Oral Daily   budesonide (PULMICORT) nebulizer solution  0.5 mg Nebulization BID   chlorhexidine  15 mL Mouth Rinse BID   Chlorhexidine Gluconate Cloth  6 each Topical Daily   heparin  5,000 Units Subcutaneous Q8H   levalbuterol  0.63 mg Nebulization Q6H   mouth rinse  15 mL Mouth Rinse q12n4p   methylPREDNISolone (SOLU-MEDROL) injection  40 mg Intravenous Daily   pantoprazole (PROTONIX) IV  40 mg Intravenous Daily   sodium chloride flush  3 mL Intravenous Q12H   ticagrelor  90 mg Per Tube BID   Continuous Infusions:  sodium chloride     sodium chloride 500 mL (12/12/18 2130)   piperacillin-tazobactam (ZOSYN)  IV 3.375 g (12/14/18 0602)   PRN Meds: sodium chloride, acetaminophen, ALPRAZolam, bisacodyl, levalbuterol, LORazepam, nitroGLYCERIN, ondansetron (ZOFRAN) IV, phenol, sennosides, sodium chloride flush, zolpidem   Vital Signs    Vitals:   12/14/18 0400 12/14/18 0500 12/14/18 0515 12/14/18 0600  BP: 102/74 (!) 138/92  113/83  Pulse: 96 (!) 112 (!) 101 94  Resp: 20 (!) 32 (!) 28 (!) 22  Temp: 98.3 F (36.8 C)     TempSrc: Oral     SpO2: 98% 92% 97% 99%  Weight:    86.1 kg  Height:         Intake/Output Summary (Last 24 hours) at 12/14/2018 0641 Last data filed at 12/14/2018 0500 Gross per 24 hour  Intake 1007.13 ml  Output 1400 ml  Net -392.87 ml   Filed Weights   12/12/18 1100 12/13/18 0600 12/14/18 0600  Weight: 86 kg 89.6 kg 86.1 kg    Telemetry    Episode of nonsustained V. tach yesterday morning- Personally Reviewed  ECG    Sinus tachycardia- Personally Reviewed  Physical Exam   GEN: No acute distress, BiPAP in place Cardiac: RRR, no murmurs, rubs, or gallops.  Respiratory: Clear to auscultation bilaterally. MS: No edema; No deformity. Neuro:  Nonfocal   Labs    Chemistry Recent Labs  Lab 12/12/18 0729  12/13/18 0429 12/13/18 2122 12/13/18 2237 12/14/18 0229  NA 144   < > 141 142 143 140  K 3.4*   < > 3.1* 4.2 3.4* 3.8  CL 100  --  107  --   --  106  CO2  --   --  22  --   --  24  GLUCOSE 124*  --  162*  --   --  140*  BUN 22  --  26*  --   --  29*  CREATININE 0.90  --  1.32*  --   --  1.08*  CALCIUM  --   --  8.7*  --   --  8.9  PROT  --   --  6.2*  --   --   --   ALBUMIN  --   --  3.0*  --   --   --   AST  --   --  219*  --   --   --   ALT  --   --  69*  --   --   --   ALKPHOS  --   --  87  --   --   --   BILITOT  --   --  0.2*  --   --   --   GFRNONAA  --   --  43*  --   --  55*  GFRAA  --   --  50*  --   --  >60  ANIONGAP  --   --  12  --   --  10   < > = values in this interval not displayed.     Hematology Recent Labs  Lab 12/12/18 2240 12/13/18 0429 12/13/18 2122 12/13/18 2237 12/14/18 0229  WBC 15.0* 14.8*  --   --  20.5*  RBC 5.33* 5.17*  --   --  5.58*  HGB 15.1* 14.2 15.6* 15.6* 15.2*  HCT 46.7* 45.1 46.0 46.0 49.8*  MCV 87.6 87.2  --   --  89.2  MCH 28.3 27.5  --   --  27.2  MCHC 32.3 31.5  --   --  30.5  RDW 14.1 14.1  --   --  14.6  PLT 319 344  --   --  299    Cardiac EnzymesNo results for input(s): TROPONINI in the last 168 hours. No results for input(s): TROPIPOC in the last 168 hours.    BNP Recent Labs  Lab 12/12/18 1132  BNP 248.0*     DDimer No results for input(s): DDIMER in the last 168 hours.   Radiology    Dg Abd 1 View  Result Date: 12/12/2018 CLINICAL DATA:  OG tube placement. EXAM: ABDOMEN - 1 VIEW COMPARISON:  None. FINDINGS: An OG tube is noted with tip overlying the mid stomach. IMPRESSION: OG tube with tip overlying the mid stomach. Electronically Signed   By: Margarette Canada M.D.   On: 12/12/2018 19:13   Ct Head Wo Contrast  Result Date: 12/12/2018 CLINICAL DATA:  62 year old female with altered mental status. Presented with acute myocardial infarction and cardiac catheterization. EXAM: CT HEAD WITHOUT CONTRAST TECHNIQUE: Contiguous axial images were obtained from the base of the skull through the vertex without intravenous contrast. COMPARISON:  01/08/2017 MR FINDINGS: Brain: No evidence of acute infarction, hemorrhage, hydrocephalus, extra-axial collection or mass lesion/mass effect. Very mild chronic small-vessel white matter ischemic changes again noted. Vascular: Carotid atherosclerotic calcifications noted. Skull: Normal. Negative for fracture or focal lesion. Sinuses/Orbits: No acute abnormality. Chronic mucosal thickening within sphenoid sinuses noted. Other: None IMPRESSION: 1. No evidence of acute intracranial abnormality. 2. Mild chronic small-vessel white matter ischemic changes. Electronically Signed   By: Margarette Canada M.D.   On: 12/12/2018 14:56   Dg Chest Port 1 View  Result Date: 12/13/2018 CLINICAL DATA:  63 year old with acute hypoxia. EXAM: PORTABLE CHEST 1 VIEW 9:01 p.m.: COMPARISON:  Portable chest x-ray earlier same day at 5 o'clock a.m. and previously. FINDINGS: Extubation and removal of the Swan-Ganz catheter since the examination this morning. Interval development of airspace opacities in the RIGHT UPPER LOBE. Pleuroparenchymal scarring on  the RIGHT with tethering of the RIGHT hemidiaphragm, unchanged. Pulmonary venous hypertension and  perhaps mild interstitial pulmonary edema. Cardiac silhouette normal in size for AP portable technique. IMPRESSION: 1. Acute pneumonia in the RIGHT UPPER LOBE since the examination this morning, possibly aspiration. 2. Pulmonary venous hypertension and perhaps mild interstitial pulmonary edema, query fluid overload. Electronically Signed   By: Evangeline Dakin M.D.   On: 12/13/2018 21:09   Dg Chest Port 1 View  Result Date: 12/13/2018 CLINICAL DATA:  Endotracheal tube EXAM: PORTABLE CHEST 1 VIEW COMPARISON:  Yesterday FINDINGS: Endotracheal tube tip between the clavicular heads and carina. The orogastric tube at least reaches the stomach. Pulmonary artery catheter from below with tip overlapping the main pulmonary artery. An aortic balloon pump marker is not seen today. Right perihilar architectural distortion and increased density with radiation fibrosis appearance on 2019 CT. There is also been a PET-CT 06/27/2018, images not currently available. There is no edema, consolidation, effusion, or pneumothorax. IMPRESSION: 1. Unremarkable hardware positioning. 2. Stable aeration. Electronically Signed   By: Monte Fantasia M.D.   On: 12/13/2018 07:54   Dg Chest Port 1 View  Result Date: 12/12/2018 CLINICAL DATA:  Chest pain. History of STEMI December 12, 2018. History of colon cancer and lung cancer. EXAM: PORTABLE CHEST 1 VIEW COMPARISON:  November 18, 2016 FINDINGS: Fibrotic changes are seen in the right suprahilar region, more masslike in appearance compared to November 18, 2016. Pleural thickening is seen in the right apex, likely post therapeutic. Mild opacity is seen in the right mid lung, peripherally. Mild increased interstitial markings in the upper lobes. The heart, left hilum, and mediastinum are unchanged. An ETT is in good position. An intra aortic balloon pump terminates 7.5 cm below the thoracic aortic arch. The distal tip of a PA catheter from an inferior approach projects over the left hilum.  IMPRESSION: 1. The ETT is in good position. 2. An intra-aortic balloon pump appears to have been placed, terminating 7.5 cm below the top of the aortic arch. 3. The PA catheter approaches via the inferior vena cava with the distal tip projected over the proximal outflow tract. 4. Increased interstitial markings in the apices may represent mild pulmonary venous congestion. 5. Increasing masslike opacity in the right suprahilar region. Fibrotic change was seen in this region November 18, 2016. The increased fullness could represent recurrence or evolution of postradiation change. 6. More focal opacity is seen in the periphery of the right mid lung, possibly infectious. Electronically Signed   By: Dorise Bullion III M.D   On: 12/12/2018 11:25    Cardiac Studies   Echocardiogram December 12, 2018 1. Technically difficult study with limited views. Left ventricular ejection fraction, by visual estimation, is 35 to 40%. Hypokinesis of apex, anterior/septal/lateral apical walls, mid to basal inferolateral wall 2. Global right ventricle has normal systolic function.The right ventricular size is normal.  FINDINGS Left Ventricle: Left ventricular ejection fraction, by visual estimation, is 35 to 40%. The left ventricle has moderately decreased function. There is mildly increased left ventricular hypertrophy. Concentric left ventricular hypertrophy. Spectral  Doppler shows Left ventricular diastolic Doppler parameters are consistent with impaired relaxation pattern of LV diastolic filling.  Right Ventricle: The right ventricular size is normal. No increase in right ventricular wall thickness. Global RV systolic function is has normal systolic function.  Left Atrium: Left atrial size was normal in size.  Right Atrium: Right atrial size was not well visualized  Pericardium: The pericardium was not well visualized.  Mitral Valve: The mitral valve was not well visualized. Not assessed mitral valve  regurgitation.  Tricuspid Valve: The tricuspid valve is not well visualized. Tricuspid valve regurgitation was not assessed by color flow Doppler.  Aortic Valve: The aortic valve was not well visualized. Aortic valve regurgitation was not assessed by color flow Doppler.  Pulmonic Valve: The pulmonic valve was not well visualized. Pulmonic valve regurgitation was not assessed by color flow Doppler.  Aorta: The aortic root was not well visualized.  IAS/Shunts: The interatrial septum was not well visualized.   December 12, 2018-Left heart cath and coronary angiography   Acute lateral wall myocardial infarction due to thrombotic occlusion of the circumflex.  High-grade obstruction of the proximal RCA with collaterals to the apical LAD  Chronic total occlusion of the proximal LAD.  PTCA and stent implantation proximal to mid circumflex reducing 100% stenosis to less than 10% with TIMI grade III flow. Final post dual balloon diameter 2.83 mm using A 22 x 2.75 Onyx. No postdilatation was performed.  Mid anterior wall akinesis with anteroapical hypokinesis. LVEDP 25 mmHg.  Mean pulmonary capillary wedge pressure 7 mmHg, PA pressure 24 over 12 mmHg.  Intra-aortic balloon pump at 1:1 pumping.  Altered mental status prior to intubation. Etiology is unclear but likely related to CO2 narcosis. Rule out intracranial hemorrhage/bleed in the setting of intense anticoagulation.  Patient Profile     63 y.o. female with medical history significant for COPD, lung cancer status post pneumonectomy, tobacco use disorder, colon cancer with colostomy who presented to [facility], ED on December 12, 2018 with chest pain found to have STEMI found to have high-grade obstruction of proximal RCA status post PTCA and stent implantation of proximal to mid circumflex.  During her coronary intervention, she developed acute encephalopathy and hypercarbia with PCO2 of 81 which warranted emergent intubation.   Due to hypoxia and elevated left ventricular end-diastolic pressure of 28 mm per mercury, and intra-aortic balloon pump was inserted.  Assessment & Plan    #STEMI #s/p PTCA and stent implantation proximal to mid circumflex  Does not report of chest pain.  Review of telemetry monitor unremarkable. -Continue aspirin, Lipitor, Brilinta  #Systolic heart failure No evidence of volume overload -Continue bisoprolol 5 mg daily  #Acute encephalopathy secondary to hypercarbia #Hypercarbic/hypoxic respiratory failure #COPD Overnight, she became lethargic, hypoxic and was noted to have increased work of breathing.    Stat ABG reviewed respiratory acidosis with pH of 7.1 and PCO2 of 81.  She was then placed on a BiPAP.  This a.m., she is alert, conversational with no sign of encephalopathy. -Continue BiPAP -Continue prednisone -Appreciate assistance from PCCM  #Probable right upper lobe pneumonia Chest x-ray yesterday reviewed possible right upper lobe infiltrate concerning for aspiration pneumonia -Continue Zosyn -Follow-up blood culture, respiratory culture  #Urinary retention Bladder scan yesterday showed a total of 999 cc of urine.  She was able to void 450 cc and required in and out cath with total of 550 cc obtained  For questions or updates, please contact [place] Please consult [email or web address] for contact info under Cardiology/STEMI.      Signed, Jean Rosenthal, MD  12/14/2018, 6:41 AM

## 2018-12-14 NOTE — Evaluation (Signed)
Clinical/Bedside Swallow Evaluation Patient Details  Name: Chelsea Allison MRN: [id number] Date of Birth: 1955/03/19  Today's Date: 12/14/2018 Time: SLP Start Time (ACUTE ONLY): 1022 SLP Stop Time (ACUTE ONLY): 1035 SLP Time Calculation (min) (ACUTE ONLY): 13 min  Past Medical History:  Past Medical History:  Diagnosis Date  . Cancer of lung ([place])    Twice  . Colon cancer ([place])   . COPD (chronic obstructive pulmonary disease) ([place])   . Hypertension   . Neuropathy    Secondary to chemo  . STEMI (ST elevation myocardial infarction) ([place]) 12/12/2018   Past Surgical History:  Past Surgical History:  Procedure Laterality Date  . ABDOMINAL SURGERY     For colon cancer  . COLOSTOMY    . CORONARY/GRAFT ACUTE MI REVASCULARIZATION N/A 12/12/2018   Procedure: Coronary/Graft Acute MI Revascularization;  Surgeon: Belva Crome, MD;  Location: [facility];  Service: Cardiovascular;  Laterality: N/A;  . LEFT HEART CATH AND CORONARY ANGIOGRAPHY N/A 12/12/2018   Procedure: LEFT HEART CATH AND CORONARY ANGIOGRAPHY;  Surgeon: Belva Crome, MD;  Location: [facility];  Service: Cardiovascular;  Laterality: N/A;   HPI:  Pt is a 63 yo female admitted with MI and then decompensated in the cath lab requiring intubation 10/5-10/6. Overnight on 10/6 she had increased WOB requiring BiPAP and CXR showed a new RUL infiltrate. PMH: severe COPD, lung cancer s/p partial R lung resection, colon cancer with nephrostomy, active smoker, HTN   Assessment / Plan / Recommendation Clinical Impression  Pt's oropharyngeal swallow appears functional and she has no overt s/s of aspiration. She denies any prior difficulties swallowing and has not had PNA since 2012. CXR is suggestive of possible aspiration, and pt did have several possible risk factors for aspiration, including intubation, increased WOB, and AMS, particularly combined with her h/o severe COPD and self-reported hiatal hernia. It is possible that she  had an aspiration event, but she appears to be doing well with her clear liquid diet at the moment. SLP reviewed aspiration and esophageal precautions and encouraged her to also take frequent breaks for her respirations (although she does not appear to be very tachypneic at the moment). MD could likely advance her diet given appropriate oral preparation with pureed consistencies, but would likely keep food to mechanical soft given her esophageal hx and the condition of her dentition. Would also emphasize the importance of oral hygiene while she is here. SLP will continue to follow closely.  SLP Visit Diagnosis: Dysphagia, unspecified (R13.10)    Aspiration Risk  Mild aspiration risk    Diet Recommendation Dysphagia 3 (Mech soft);Thin liquid   Liquid Administration via: Cup;Straw Medication Administration: Whole meds with liquid Supervision: Patient able to self feed;Intermittent supervision to cue for compensatory strategies Compensations: Slow rate;Small sips/bites Postural Changes: Seated upright at 90 degrees;Remain upright for at least 30 minutes after po intake    Other  Recommendations Oral Care Recommendations: Oral care BID   Follow up Recommendations (tba)      Frequency and Duration min 2x/week  2 weeks       Prognosis Prognosis for Safe Diet Advancement: Good      Swallow Study   General HPI: Pt is a 63 yo female admitted with MI and then decompensated in the cath lab requiring intubation 10/5-10/6. Overnight on 10/6 she had increased WOB requiring BiPAP and CXR showed a new RUL infiltrate. PMH: severe COPD, lung cancer s/p partial R lung resection, colon cancer with nephrostomy, active  smoker, HTN Type of Study: Bedside Swallow Evaluation Previous Swallow Assessment: none in chart Diet Prior to this Study: Thin liquids Temperature Spikes Noted: No Respiratory Status: Nasal cannula History of Recent Intubation: Yes Length of Intubations (days): 1 days Date extubated:  12/13/18 Behavior/Cognition: Alert;Cooperative;Pleasant mood Oral Cavity Assessment: Within Functional Limits Oral Care Completed by SLP: No Oral Cavity - Dentition: Poor condition;Missing dentition Vision: Functional for self-feeding Self-Feeding Abilities: Able to feed self Patient Positioning: Upright in bed Baseline Vocal Quality: Normal Volitional Swallow: Able to elicit    Oral/Motor/Sensory Function Overall Oral Motor/Sensory Function: Within functional limits   Ice Chips Ice chips: Not tested   Thin Liquid Thin Liquid: Within functional limits Presentation: Cup;Self Fed;Straw    Nectar Thick Nectar Thick Liquid: Not tested   Honey Thick Honey Thick Liquid: Not tested   Puree Puree: Within functional limits Presentation: Self Fed;Spoon   Solid     Solid: Not tested      [name] [name] 12/14/2018,10:57 AM  Pollyann Glen, M.A. [place] Acute Environmental education officer [phone] Office [phone]

## 2018-12-14 NOTE — Evaluation (Signed)
Physical Therapy Evaluation Patient Details Name: Chelsea Allison MRN: [id number] DOB: [birth year] Today's Date: 12/14/2018   History of Present Illness  Pt is a 63 yo female admitted with MI and then decompensated in the cath lab requiring intubation 10/5-10/6. Overnight on 10/6 she had increased WOB requiring BiPAP and CXR showed a new RUL infiltrate. PMH: severe COPD, lung cancer s/p partial R lung resection, colon cancer with nephrostomy, active smoker, HTN     Clinical Impression  Pt presents with an overall decrease in functional mobility secondary to above. PTA, pt independent and lives alone. Today, pt requiring minA for limited mobility; pt limited by significant decreased activity tolerance and generalized weakness, requiring multiple, frequent rest breaks throughout session. SpO2 >90% on 4L O2 [place]. Discussed recommendation for SNF-level therapies to maximize functional mobility and independence prior to return home; pt willing to consider this or [facility] services. Will follow acutely to address established goals.    Follow Up Recommendations SNF;Supervision for mobility/OOB    Equipment Recommendations  (TBD)    Recommendations for Other Services       Precautions / Restrictions Precautions Precaution Comments: Easily SOB (h/o lung CA) Restrictions Weight Bearing Restrictions: No      Mobility  Bed Mobility Overal bed mobility: Needs Assistance Bed Mobility: Supine to Sit     Supine to sit: Min assist;HOB elevated     General bed mobility comments: Increased time and effort due to DOE, use of bed rail and minA to scoot hips to EOB  Transfers Overall transfer level: Needs assistance Equipment used: 1 person hand held assist Transfers: Sit to/from Stand Sit to Stand: Min assist         General transfer comment: Reliant on momentum to power into standing, minA for HHA to steady; able to stand from bed and [facility]  Ambulation/Gait Ambulation/Gait assistance: Min  assist Gait Distance (Feet): 5 Feet Assistive device: 1 person hand held assist Gait Pattern/deviations: Step-through pattern;Decreased stride length;Trunk flexed   Gait velocity interpretation: <1.31 ft/sec, indicative of household ambulator General Gait Details: Amb from bed to [facility] to recliner with HHA for steadying assist; pt very mindful of SOB requiring frequent, prolonged rest  Stairs            Wheelchair Mobility    Modified Rankin (Stroke Patients Only)       Balance Overall balance assessment: Needs assistance   Sitting balance-Leahy Scale: Good       Standing balance-Leahy Scale: Fair                               Pertinent Vitals/Pain Pain Assessment: Faces Faces Pain Scale: Hurts a little bit Pain Location: Generalized Pain Descriptors / Indicators: Sore Pain Intervention(s): Monitored during session    Home Living Family/patient expects to be discharged to:: Private residence Living Arrangements: Alone Available Help at Discharge: Family;Available PRN/intermittently Type of Home: Apartment Home Access: Level entry     Home Layout: One level Home Equipment: None      Prior Function Level of Independence: Independent         Comments: Independent with ambulation and ADLs. Drives     Hand Dominance        Extremity/Trunk Assessment   Upper Extremity Assessment Upper Extremity Assessment: Generalized weakness    Lower Extremity Assessment Lower Extremity Assessment: Generalized weakness       Communication   Communication: No difficulties  Cognition Arousal/Alertness: Awake/alert Behavior During  Therapy: WFL for tasks assessed/performed Overall Cognitive Status: Within Functional Limits for tasks assessed                                        General Comments General comments (skin integrity, edema, etc.): SpO2 >90% on 4L O2 [place], respiration rate up to 30, HR 85    Exercises      Assessment/Plan    PT Assessment Patient needs continued PT services  PT Problem List Decreased strength;Decreased activity tolerance;Decreased balance;Decreased mobility;Decreased knowledge of use of DME;Cardiopulmonary status limiting activity       PT Treatment Interventions DME instruction;Gait training;Functional mobility training;Therapeutic activities;Therapeutic exercise;Balance training;Patient/family education    PT Goals (Current goals can be found in the Care Plan section)  Acute Rehab PT Goals Patient Stated Goal: Wait to see if insurance can cover SNF vs. HH services PT Goal Formulation: With patient Time For Goal Achievement: 12/28/18 Potential to Achieve Goals: Good    Frequency Min 3X/week   Barriers to discharge Decreased caregiver support      Co-evaluation               AM-PAC PT "6 Clicks" Mobility  Outcome Measure Help needed turning from your back to your side while in a flat bed without using bedrails?: A Little Help needed moving from lying on your back to sitting on the side of a flat bed without using bedrails?: A Little Help needed moving to and from a bed to a chair (including a wheelchair)?: A Little Help needed standing up from a chair using your arms (e.g., wheelchair or bedside chair)?: A Little Help needed to walk in hospital room?: A Little Help needed climbing 3-5 steps with a railing? : A Lot 6 Click Score: 17    End of Session   Activity Tolerance: Patient tolerated treatment well;Patient limited by fatigue Patient left: in chair;with call bell/phone within reach;with chair alarm set Nurse Communication: Mobility status PT Visit Diagnosis: Other abnormalities of gait and mobility (R26.89);Muscle weakness (generalized) (M62.81)    Time: 0102-7253 PT Time Calculation (min) (ACUTE ONLY): 34 min   Charges:   PT Evaluation $PT Eval Moderate Complexity: [place], PT, DPT Acute Rehabilitation Services  Pager  [phone] Office [place] 12/14/2018, 12:10 PM

## 2018-12-14 NOTE — Progress Notes (Signed)
RT Note: Placed on BIPAP for HS rest/anxiety

## 2018-12-14 NOTE — Progress Notes (Signed)
RT note- Bipap placed due to anxiety by nursing.

## 2018-12-14 NOTE — Plan of Care (Signed)
  Problem: Education: Goal: Understanding of cardiac disease, CV risk reduction, and recovery process will improve Outcome: Progressing Goal: Understanding of medication regimen will improve Outcome: Progressing   Problem: Cardiac: Goal: Ability to achieve and maintain adequate cardiopulmonary perfusion will improve Outcome: Progressing Goal: Vascular access site(s) Level 0-1 will be maintained Outcome: Progressing   Problem: Health Behavior/Discharge Planning: Goal: Ability to safely manage health-related needs after discharge will improve Outcome: Progressing   Problem: Education: Goal: Knowledge of General Education information will improve Description: Including pain rating scale, medication(s)/side effects and non-pharmacologic comfort measures Outcome: Progressing   Problem: Health Behavior/Discharge Planning: Goal: Ability to manage health-related needs will improve Outcome: Progressing   Problem: Clinical Measurements: Goal: Will remain free from infection Outcome: Progressing Goal: Cardiovascular complication will be avoided Outcome: Progressing   Problem: Pain Managment: Goal: General experience of comfort will improve Outcome: Progressing   Problem: Safety: Goal: Ability to remain free from injury will improve Outcome: Progressing   Problem: Skin Integrity: Goal: Risk for impaired skin integrity will decrease Outcome: Progressing

## 2018-12-14 NOTE — Progress Notes (Signed)
Remains calm, cooperative, oriented.Tolerating BiPAP.   Has not voided since lasix, bladder scan shows 999 mL, with encouragment pt able to void 450 mL via external catheter, in and out cath done with 550 mL obtained. Pt tolerated well.

## 2018-12-14 NOTE — Evaluation (Signed)
Occupational Therapy Evaluation Patient Details Name: Chelsea Allison MRN: [id number] DOB: [birth year] Today's Date: 12/14/2018    History of Present Illness Pt is a 63 yo female admitted with MI and then decompensated in the cath lab requiring intubation 10/5-10/6. Overnight on 10/6 she had increased WOB requiring BiPAP and CXR showed a new RUL infiltrate. PMH: severe COPD, lung cancer s/p partial R lung resection, colon cancer with nephrostomy, active smoker, HTN    Clinical Impression   PTA Pt was independent in ADL and mobility, drives. Today Pt is limited by anxiety - but willing to work with therapy and perform toilet transfer to [facility] with min A +2, lateral leans for peri care, seated grooming tasks with set up, but mod A for LB ADL at this time. Pt SOB, and responded positively to simple caring calm instructions. At this time recommending SNF post-acute for generalized deconditioning, decreased activity tolerance, and as Pt lives alone and cannot get 24 hour care. She will require SNF to maximize safety and independence in ADL and functional transfers prior to return home.     Follow Up Recommendations  SNF;Supervision/Assistance - 24 hour    Equipment Recommendations  Other (comment)(defer to next venue)    Recommendations for Other Services       Precautions / Restrictions Precautions Precaution Comments: Easily SOB (h/o lung CA); ostomy bag Restrictions Weight Bearing Restrictions: No      Mobility Bed Mobility Overal bed mobility: Needs Assistance Bed Mobility: Supine to Sit     Supine to sit: Min assist;HOB elevated     General bed mobility comments: Increased time and effort due to DOE, use of bed rail and minA to scoot hips to EOB  Transfers Overall transfer level: Needs assistance Equipment used: 1 person hand held assist Transfers: Sit to/from [facility] Sit to Stand: Min assist Stand pivot transfers: Min assist       General transfer  comment: Reliant on momentum to power into standing, minA for HHA to steady; able to stand from bed and BSC    Balance Overall balance assessment: Needs assistance Sitting-balance support: Single extremity supported;Feet supported Sitting balance-Leahy Scale: Good       Standing balance-Leahy Scale: Fair                             ADL either performed or assessed with clinical judgement   ADL Overall ADL's : Needs assistance/impaired Eating/Feeding: Modified independent;Sitting   Grooming: Wash/dry face;Oral care;Set up;Sitting   Upper Body Bathing: Moderate assistance   Lower Body Bathing: Moderate assistance   Upper Body Dressing : Minimal assistance   Lower Body Dressing: Moderate assistance;Sit to/from stand   Toilet Transfer: Minimal Production assistant, radio Details (indicate cue type and reason): 1 person HHA fpr balance and steady Toileting- Clothing Manipulation and Hygiene: Set up;Sitting/lateral lean       Functional mobility during ADLs: Minimal assistance(HHA SPT only) General ADL Comments: deconditioned      Vision         Perception     Praxis      Pertinent Vitals/Pain Pain Assessment: Faces Pain Location: Generalized Pain Descriptors / Indicators: Sore Pain Intervention(s): Monitored during session;Repositioned     Hand Dominance Right   Extremity/Trunk Assessment Upper Extremity Assessment Upper Extremity Assessment: Generalized weakness   Lower Extremity Assessment Lower Extremity Assessment: Generalized weakness   Cervical / Trunk Assessment Cervical / Trunk Assessment: Other exceptions Cervical / Trunk Exceptions:  ostomy bag   Communication Communication Communication: No difficulties   Cognition Arousal/Alertness: Awake/alert Behavior During Therapy: Anxious Overall Cognitive Status: Within Functional Limits for tasks assessed                                 General Comments: got  anxiety medication 1 hour prior to session   General Comments  SpO2 >90% on 4L O2 [place], respiration rate up to 30, HR 85    Exercises     Shoulder Instructions      Home Living Family/patient expects to be discharged to:: Private residence Living Arrangements: Alone Available Help at Discharge: Family;Available PRN/intermittently Type of Home: Apartment Home Access: Level entry     Home Layout: One level     Bathroom Shower/Tub: Teacher, early years/pre: Standard     Home Equipment: None          Prior Functioning/Environment Level of Independence: Independent        Comments: drives        OT Problem List: Decreased activity tolerance;Decreased strength;Impaired balance (sitting and/or standing);Decreased safety awareness;Decreased knowledge of use of DME or AE;Decreased knowledge of precautions;Cardiopulmonary status limiting activity;Obesity      OT Treatment/Interventions: Self-care/ADL training;Therapeutic exercise;DME and/or AE instruction;Therapeutic activities;Patient/family education;Balance training    OT Goals(Current goals can be found in the care plan section) Acute Rehab OT Goals Patient Stated Goal: Wait to see if insurance can cover SNF vs. HH services OT Goal Formulation: With patient Time For Goal Achievement: 12/28/18 Potential to Achieve Goals: Good ADL Goals Pt Will Perform Grooming: standing;with modified independence Pt Will Perform Upper Body Dressing: with modified independence;sitting Pt Will Perform Lower Body Dressing: with supervision;sit to/from stand Pt Will Transfer to Toilet: with supervision;ambulating Pt Will Perform Toileting - Clothing Manipulation and hygiene: with modified independence;sitting/lateral leans Additional ADL Goal #1: Pt will recall 3 ways of conserving energy during ADL at independent level  OT Frequency: Min 2X/week   Barriers to D/C: Decreased caregiver support  Pt lives alone        Co-evaluation PT/OT/SLP Co-Evaluation/Treatment: Yes Reason for Co-Treatment: To address functional/ADL transfers;For patient/therapist safety PT goals addressed during session: Mobility/safety with mobility;Balance OT goals addressed during session: ADL's and self-care;Proper use of Adaptive equipment and DME      AM-PAC OT "6 Clicks" Daily Activity     Outcome Measure Help from another person eating meals?: A Little Help from another person taking care of personal grooming?: A Little Help from another person toileting, which includes using toliet, bedpan, or urinal?: A Little Help from another person bathing (including washing, rinsing, drying)?: A Lot Help from another person to put on and taking off regular upper body clothing?: A Little Help from another person to put on and taking off regular lower body clothing?: A Lot 6 Click Score: 16   End of Session Equipment Utilized During Treatment: Oxygen Nurse Communication: Mobility status;Other (comment)(unirated in [facility] (urine collected in bathroom))  Activity Tolerance: Patient tolerated treatment well Patient left: in chair;with call bell/phone within reach;with chair alarm set  OT Visit Diagnosis: Unsteadiness on feet (R26.81);Other abnormalities of gait and mobility (R26.89);Muscle weakness (generalized) (M62.81)                Time: 6222-9798 OT Time Calculation (min): 43 min Charges:  OT General Charges $OT Visit: 1 Visit OT Evaluation $OT Eval Moderate Complexity: 1 Mod  Burman Bruington OTR/L Acute  Rehabilitation Services Pager: [phone] Office: [place] 12/14/2018, 5:57 PM

## 2018-12-14 NOTE — Progress Notes (Signed)
NAME:  Chelsea Allison, MRN:  [id number], DOB:  1956-03-01, LOS: 2 ADMISSION DATE:  12/12/2018, CONSULTATION DATE: 12/12/2018 REFERRING MD: Linard Millers, MD, CHIEF COMPLAINT: ST elevation MI, respiratory failure  Brief History   63 year old with severe COPD, lung cancer status post resection, active smoker Admitted with chest elevation MI.  Decompensated in the Cath Lab with hypercarbia and intubated.  PCCM consulted for vent management.  Past Medical History  Severe COPD, prior partial right lung resection for lung cancer 2015, history of colon cancer with nephrostomy.   She has severe COPD, lung cancer on Breo inhaler.  Follows at [facility] pulmonary.  Current active smoker.  Lung cancer was treated with chemotherapy, right upper lobectomy in 2015.  PET scan at [facility] April 2020 shows no evidence of recurrence.  Significant Hospital Events   10/5-Admit with MI, status post cath, intra-aortic balloon pump  Consults:  PCCM  Procedures:  Endotracheal tube 10/5 >  Significant Diagnostic Tests:    Micro Data:    Antimicrobials:    Interim history/subjective:  Acute flash pulmonary edema overnight with acute respiratory acidosis requiring BiPAP and PCCM reconsulted  Objective   Blood pressure 99/61, pulse 91, temperature (!) 96.7 F (35.9 C), temperature source Oral, resp. rate (!) 39, height 5\' 8"  (1.727 m), weight 86.1 kg, SpO2 99 %.    FiO2 (%):  [40 %] 40 %   Intake/Output Summary (Last 24 hours) at 12/14/2018 1002 Last data filed at 12/14/2018 0800 Gross per 24 hour  Intake 783.99 ml  Output 1300 ml  Net -516.01 ml   Filed Weights   12/12/18 1100 12/13/18 0600 12/14/18 0600  Weight: 86 kg 89.6 kg 86.1 kg    Examination: Gen:      Improved appearance this AM, NAD HEENT:  [place]/AT, PERRL, EOM-I and MMM, BiPAP on Lungs:    Diminished bilaterally CV:         RRR, Nl S1/S2 and -M/R/G Abd:      Soft, NT, ND and +BS Ext:    No edema; adequate peripheral perfusion Skin:        Warm and dry; no rash Neuro:    Awake and interactive, moving all ext to command  I reviewed CXR myself, LUL opacity noted  Resolved Hospital Problem list     Assessment & Plan:  Hypercarbic, hypoxic respiratory failure in the setting of severe COPD, MI History of lung cancer status post resection, active smoker. COPD but no exacerbation evident right now LUL infiltrate concern for PNA and WBC is elevated Acute pulmonary edema Attempt off BiPAP Titrate O2 for sat of 88-92% D/C further ABG IS and flutter valve No need for steroids at this time, no wheezing Ambulate BD as ordered Continue zosyn F/U on cultures Maintain fluid even at this point Lasix as ordered Check procal, if normal and cultures are negative by AM would d/c zosyn  STEMI, cardiogenic shock D/C IABP D/C pressors May need to consider restarting home anti-HTN upon extubation  On afternoon rounds the patient had increased WOB again and was placed back on BiPAP, will reorder an additional dose of lasix and allow to rest on BiPAP.  Labs   CBC: Recent Labs  Lab 12/12/18 0657  12/12/18 2240 12/13/18 0429 12/13/18 2122 12/13/18 2237 12/14/18 0229  WBC 12.8*  --  15.0* 14.8*  --   --  20.5*  NEUTROABS 10.7*  --   --   --   --   --   --  HGB 16.8*   < > 15.1* 14.2 15.6* 15.6* 15.2*  HCT 53.9*   < > 46.7* 45.1 46.0 46.0 49.8*  MCV 90.4  --  87.6 87.2  --   --  89.2  PLT 327  --  319 344  --   --  299   < > = values in this interval not displayed.    Basic Metabolic Panel: Recent Labs  Lab 12/12/18 0729  12/12/18 1131 12/13/18 0429 12/13/18 2122 12/13/18 2237 12/14/18 0229  NA 144   < > 141 141 142 143 140  K 3.4*   < > 3.6 3.1* 4.2 3.4* 3.8  CL 100  --   --  107  --   --  106  CO2  --   --   --  22  --   --  24  GLUCOSE 124*  --   --  162*  --   --  140*  BUN 22  --   --  26*  --   --  29*  CREATININE 0.90  --   --  1.32*  --   --  1.08*  CALCIUM  --   --   --  8.7*  --   --  8.9  MG  --    --   --   --   --   --  2.1   < > = values in this interval not displayed.   GFR: Estimated Creatinine Clearance: 62.1 mL/min (A) (by C-G formula based on SCr of 1.08 mg/dL (H)). Recent Labs  Lab 12/12/18 0657 12/12/18 2240 12/13/18 0429 12/14/18 0229  WBC 12.8* 15.0* 14.8* 20.5*    Liver Function Tests: Recent Labs  Lab 12/13/18 0429  AST 219*  ALT 69*  ALKPHOS 87  BILITOT 0.2*  PROT 6.2*  ALBUMIN 3.0*   No results for input(s): LIPASE, AMYLASE in the last 168 hours. No results for input(s): AMMONIA in the last 168 hours.  ABG    Component Value Date/Time   PHART 7.308 (L) 12/13/2018 2237   PCO2ART 52.5 (H) 12/13/2018 2237   PO2ART 96.0 12/13/2018 2237   HCO3 26.3 12/13/2018 2237   TCO2 28 12/13/2018 2237   ACIDBASEDEF 1.0 12/13/2018 2237   O2SAT 97.0 12/13/2018 2237     Coagulation Profile: Recent Labs  Lab 12/12/18 0657  INR 1.0    Cardiac Enzymes: No results for input(s): CKTOTAL, CKMB, CKMBINDEX, TROPONINI in the last 168 hours.  HbA1C: Hgb A1c MFr Bld  Date/Time Value Ref Range Status  12/12/2018 11:32 AM 5.4 4.8 - 5.6 % Final    Comment:    (NOTE) Pre diabetes:          5.7%-6.4% Diabetes:              >6.4% Glycemic control for   <7.0% adults with diabetes     CBG: Recent Labs  Lab 12/12/18 0753  GLUCAP 119*   The patient is critically ill with multiple organ systems failure and requires high complexity decision making for assessment and support, frequent evaluation and titration of therapies, application of advanced monitoring technologies and extensive interpretation of multiple databases.   Critical Care Time devoted to patient care services described in this note is  32  Minutes. This time reflects time of care of this signee Dr Jennet Maduro. This critical care time does not reflect procedure time, or teaching time or supervisory time of PA/NP/Med student/Med Resident etc but could involve care discussion  time.  Rush Farmer,  M.D. [facility] Pulmonary/Critical Care Medicine. Pager: [phone]. After hours pager: [phone].

## 2018-12-15 ENCOUNTER — Inpatient Hospital Stay (HOSPITAL_COMMUNITY): Payer: Medicare HMO

## 2018-12-15 DIAGNOSIS — R0902 Hypoxemia: Secondary | ICD-10-CM

## 2018-12-15 DIAGNOSIS — J9601 Acute respiratory failure with hypoxia: Secondary | ICD-10-CM

## 2018-12-15 LAB — CBC
HCT: 41.9 % (ref 36.0–46.0)
HCT: 49.1 % — ABNORMAL HIGH (ref 36.0–46.0)
Hemoglobin: 12.8 g/dL (ref 12.0–15.0)
Hemoglobin: 15.1 g/dL — ABNORMAL HIGH (ref 12.0–15.0)
MCH: 27.4 pg (ref 26.0–34.0)
MCH: 28.3 pg (ref 26.0–34.0)
MCHC: 30.5 g/dL (ref 30.0–36.0)
MCHC: 30.8 g/dL (ref 30.0–36.0)
MCV: 89.5 fL (ref 80.0–100.0)
MCV: 92.1 fL (ref 80.0–100.0)
Platelets: 258 10*3/uL (ref 150–400)
Platelets: 320 10*3/uL (ref 150–400)
RBC: 4.68 MIL/uL (ref 3.87–5.11)
RBC: 5.33 MIL/uL — ABNORMAL HIGH (ref 3.87–5.11)
RDW: 14.6 % (ref 11.5–15.5)
RDW: 14.4 % (ref 11.5–15.5)
WBC: 15.1 10*3/uL — ABNORMAL HIGH (ref 4.0–10.5)
WBC: 23.9 10*3/uL — ABNORMAL HIGH (ref 4.0–10.5)
nRBC: 0 % (ref 0.0–0.2)
nRBC: 0 % (ref 0.0–0.2)

## 2018-12-15 LAB — POCT I-STAT 7, (LYTES, BLD GAS, ICA,H+H)
Acid-Base Excess: 4 mmol/L — ABNORMAL HIGH (ref 0.0–2.0)
Acid-Base Excess: 2 mmol/L (ref 0.0–2.0)
Bicarbonate: 33.8 mmol/L — ABNORMAL HIGH (ref 20.0–28.0)
Bicarbonate: 28.1 mmol/L — ABNORMAL HIGH (ref 20.0–28.0)
Calcium, Ion: 1.25 mmol/L (ref 1.15–1.40)
Calcium, Ion: 1.22 mmol/L (ref 1.15–1.40)
HCT: 46 % (ref 36.0–46.0)
HCT: 44 % (ref 36.0–46.0)
Hemoglobin: 15.6 g/dL — ABNORMAL HIGH (ref 12.0–15.0)
Hemoglobin: 15 g/dL (ref 12.0–15.0)
O2 Saturation: 100 %
O2 Saturation: 94 %
Patient temperature: 98
Patient temperature: 98
Potassium: 3.5 mmol/L (ref 3.5–5.1)
Potassium: 3.3 mmol/L — ABNORMAL LOW (ref 3.5–5.1)
Sodium: 141 mmol/L (ref 135–145)
Sodium: 143 mmol/L (ref 135–145)
TCO2: 36 mmol/L — ABNORMAL HIGH (ref 22–32)
TCO2: 29 mmol/L (ref 22–32)
pCO2 arterial: 71.2 mmHg (ref 32.0–48.0)
pCO2 arterial: 45.3 mmHg (ref 32.0–48.0)
pH, Arterial: 7.282 — ABNORMAL LOW (ref 7.350–7.450)
pH, Arterial: 7.399 (ref 7.350–7.450)
pO2, Arterial: 577 mmHg — ABNORMAL HIGH (ref 83.0–108.0)
pO2, Arterial: 72 mmHg — ABNORMAL LOW (ref 83.0–108.0)

## 2018-12-15 LAB — BASIC METABOLIC PANEL
Anion gap: 13 (ref 5–15)
Anion gap: 15 (ref 5–15)
BUN: 42 mg/dL — ABNORMAL HIGH (ref 8–23)
BUN: 37 mg/dL — ABNORMAL HIGH (ref 8–23)
CO2: 26 mmol/L (ref 22–32)
CO2: 27 mmol/L (ref 22–32)
Calcium: 9.5 mg/dL (ref 8.9–10.3)
Calcium: 9.1 mg/dL (ref 8.9–10.3)
Chloride: 102 mmol/L (ref 98–111)
Chloride: 100 mmol/L (ref 98–111)
Creatinine, Ser: 1.12 mg/dL — ABNORMAL HIGH (ref 0.44–1.00)
Creatinine, Ser: 1.25 mg/dL — ABNORMAL HIGH (ref 0.44–1.00)
GFR calc Af Amer: >60 mL/min (ref >60)
GFR calc Af Amer: 53 mL/min — ABNORMAL LOW (ref >60)
GFR calc non Af Amer: 53 mL/min — ABNORMAL LOW (ref >60)
GFR calc non Af Amer: 46 mL/min — ABNORMAL LOW (ref >60)
Glucose, Bld: 118 mg/dL — ABNORMAL HIGH (ref 70–99)
Glucose, Bld: 285 mg/dL — ABNORMAL HIGH (ref 70–99)
Potassium: 3.5 mmol/L (ref 3.5–5.1)
Potassium: 3.9 mmol/L (ref 3.5–5.1)
Sodium: 141 mmol/L (ref 135–145)
Sodium: 142 mmol/L (ref 135–145)

## 2018-12-15 LAB — GLUCOSE, CAPILLARY: Glucose-Capillary: 278 mg/dL — ABNORMAL HIGH (ref 70–99)

## 2018-12-15 LAB — LACTIC ACID, PLASMA
Lactic Acid, Venous: 2.2 mmol/L (ref 0.5–1.9)
Lactic Acid, Venous: 2 mmol/L (ref 0.5–1.9)

## 2018-12-15 LAB — MAGNESIUM: Magnesium: 2.1 mg/dL (ref 1.7–2.4)

## 2018-12-15 MED ORDER — FENTANYL CITRATE (PF) 100 MCG/2ML IJ SOLN
50.0000 ug | Freq: Once | INTRAMUSCULAR | Status: AC
  Administered 2018-12-15: 19:00:00 50 ug via INTRAVENOUS

## 2018-12-15 MED ORDER — AMOXICILLIN-POT CLAVULANATE 875-125 MG PO TABS
1.0000 | ORAL_TABLET | Freq: Two times a day (BID) | ORAL | Status: DC
  Filled 2018-12-15: qty 1

## 2018-12-15 MED ORDER — FUROSEMIDE 10 MG/ML IJ SOLN
40.0000 mg | Freq: Once | INTRAMUSCULAR | Status: AC
  Administered 2018-12-15: 18:00:00 40 mg via INTRAVENOUS

## 2018-12-15 MED ORDER — FENTANYL CITRATE (PF) 100 MCG/2ML IJ SOLN
50.0000 ug | INTRAMUSCULAR | Status: DC | PRN
  Administered 2018-12-15 – 2018-12-17 (×7): 100 ug via INTRAVENOUS
  Filled 2018-12-15 (×7): qty 2

## 2018-12-15 MED ORDER — NOREPINEPHRINE 4 MG/250ML-% IV SOLN
0.0000 ug/min | INTRAVENOUS | Status: DC
  Administered 2018-12-15: 21:00:00 2 ug/min via INTRAVENOUS
  Filled 2018-12-15: qty 250

## 2018-12-15 MED ORDER — FENTANYL CITRATE (PF) 100 MCG/2ML IJ SOLN
INTRAMUSCULAR | Status: AC
  Administered 2018-12-15: 19:00:00 50 ug via INTRAVENOUS
  Filled 2018-12-15: qty 2

## 2018-12-15 MED ORDER — MIDAZOLAM HCL 2 MG/2ML IJ SOLN
2.0000 mg | INTRAMUSCULAR | Status: DC | PRN
  Administered 2018-12-15: 19:00:00 2 mg via INTRAVENOUS
  Filled 2018-12-15: qty 2

## 2018-12-15 MED ORDER — METHYLPREDNISOLONE SODIUM SUCC 40 MG IJ SOLR
40.0000 mg | Freq: Four times a day (QID) | INTRAMUSCULAR | Status: DC
  Administered 2018-12-15 – 2018-12-16 (×3): 40 mg via INTRAVENOUS
  Filled 2018-12-15 (×3): qty 1

## 2018-12-15 MED ORDER — MIDAZOLAM HCL 2 MG/2ML IJ SOLN: 2.0000 mg | Freq: Once | INTRAMUSCULAR | Status: AC

## 2018-12-15 MED ORDER — AMOXICILLIN-POT CLAVULANATE 875-125 MG PO TABS
1.0000 | ORAL_TABLET | Freq: Two times a day (BID) | ORAL | Status: DC
  Administered 2018-12-15: 14:00:00 1 via ORAL
  Filled 2018-12-15 (×2): qty 1

## 2018-12-15 MED ORDER — PROPOFOL 1000 MG/100ML IV EMUL
0.0000 ug/kg/min | INTRAVENOUS | Status: DC
  Administered 2018-12-15: 30 ug/kg/min via INTRAVENOUS
  Administered 2018-12-15: 19:00:00 10 ug/kg/min via INTRAVENOUS
  Administered 2018-12-16: 25 ug/kg/min via INTRAVENOUS
  Administered 2018-12-16 (×3): 30 ug/kg/min via INTRAVENOUS
  Administered 2018-12-17: 06:00:00 10 ug/kg/min via INTRAVENOUS
  Filled 2018-12-15 (×7): qty 100

## 2018-12-15 MED ORDER — ACETAMINOPHEN 325 MG PO TABS: 650.0000 mg | ORAL_TABLET | ORAL | Status: DC | PRN

## 2018-12-15 MED ORDER — MIDAZOLAM HCL 2 MG/2ML IJ SOLN
2.0000 mg | Freq: Once | INTRAMUSCULAR | Status: AC
  Administered 2018-12-15: 2 mg via INTRAVENOUS

## 2018-12-15 MED ORDER — ALBUTEROL SULFATE (2.5 MG/3ML) 0.083% IN NEBU: 2.5000 mg | INHALATION_SOLUTION | RESPIRATORY_TRACT | Status: DC | PRN

## 2018-12-15 MED ORDER — ALBUTEROL SULFATE (2.5 MG/3ML) 0.083% IN NEBU
INHALATION_SOLUTION | RESPIRATORY_TRACT | Status: AC
  Administered 2018-12-15: 19:00:00 10 mg via RESPIRATORY_TRACT
  Filled 2018-12-15: qty 12

## 2018-12-15 MED ORDER — DOCUSATE SODIUM 50 MG/5ML PO LIQD: 100.0000 mg | Freq: Two times a day (BID) | ORAL | Status: DC | PRN

## 2018-12-15 MED ORDER — ETOMIDATE 2 MG/ML IV SOLN
20.0000 mg | Freq: Once | INTRAVENOUS | Status: AC
  Administered 2018-12-15: 19:00:00 20 mg via INTRAVENOUS

## 2018-12-15 MED ORDER — BISOPROLOL FUMARATE 5 MG PO TABS: 5.0000 mg | ORAL_TABLET | Freq: Every day | ORAL | Status: DC

## 2018-12-15 MED ORDER — ROSUVASTATIN CALCIUM 5 MG PO TABS
10.0000 mg | ORAL_TABLET | Freq: Every day | ORAL | Status: DC
  Administered 2018-12-15: 17:00:00 10 mg via ORAL
  Filled 2018-12-15: qty 2

## 2018-12-15 MED ORDER — ROSUVASTATIN CALCIUM 5 MG PO TABS
10.0000 mg | ORAL_TABLET | Freq: Every day | ORAL | Status: DC
  Filled 2018-12-15: qty 2

## 2018-12-15 MED ORDER — POTASSIUM CHLORIDE CRYS ER 20 MEQ PO TBCR
40.0000 meq | EXTENDED_RELEASE_TABLET | Freq: Once | ORAL | Status: AC
  Administered 2018-12-15: 11:00:00 40 meq via ORAL
  Filled 2018-12-15: qty 2

## 2018-12-15 MED ORDER — SODIUM CHLORIDE 0.9 % IV SOLN
2.0000 g | INTRAVENOUS | Status: DC
  Administered 2018-12-15 – 2018-12-19 (×5): 2 g via INTRAVENOUS
  Filled 2018-12-15: qty 2
  Filled 2018-12-15: qty 20
  Filled 2018-12-15: qty 2
  Filled 2018-12-15: qty 20
  Filled 2018-12-15 (×2): qty 2

## 2018-12-15 MED ORDER — METHYLPREDNISOLONE SODIUM SUCC 125 MG IJ SOLR
125.0000 mg | Freq: Once | INTRAMUSCULAR | Status: AC
  Administered 2018-12-15: 18:00:00 125 mg via INTRAVENOUS

## 2018-12-15 MED ORDER — ORAL CARE MOUTH RINSE
15.0000 mL | OROMUCOSAL | Status: DC
  Administered 2018-12-15 – 2018-12-18 (×21): 15 mL via OROMUCOSAL

## 2018-12-15 MED ORDER — METHYLPREDNISOLONE SODIUM SUCC 125 MG IJ SOLR
INTRAMUSCULAR | Status: AC
  Filled 2018-12-15: qty 2

## 2018-12-15 MED ORDER — CHLORHEXIDINE GLUCONATE 0.12% ORAL RINSE (MEDLINE KIT)
15.0000 mL | Freq: Two times a day (BID) | OROMUCOSAL | Status: DC
  Administered 2018-12-15 – 2018-12-18 (×5): 15 mL via OROMUCOSAL

## 2018-12-15 MED ORDER — PANTOPRAZOLE SODIUM 40 MG PO PACK
40.0000 mg | PACK | ORAL | Status: DC
  Administered 2018-12-15: 22:00:00 40 mg
  Filled 2018-12-15: qty 20

## 2018-12-15 MED ORDER — MIDAZOLAM HCL 2 MG/2ML IJ SOLN
INTRAMUSCULAR | Status: AC
  Filled 2018-12-15: qty 2

## 2018-12-15 MED ORDER — IPRATROPIUM-ALBUTEROL 0.5-2.5 (3) MG/3ML IN SOLN
3.0000 mL | RESPIRATORY_TRACT | Status: DC
  Administered 2018-12-15 – 2018-12-17 (×12): 3 mL via RESPIRATORY_TRACT
  Filled 2018-12-15 (×13): qty 3

## 2018-12-15 MED ORDER — ALBUTEROL SULFATE (2.5 MG/3ML) 0.083% IN NEBU
10.0000 mg | INHALATION_SOLUTION | Freq: Once | RESPIRATORY_TRACT | Status: AC
  Administered 2018-12-15: 19:00:00 10 mg via RESPIRATORY_TRACT

## 2018-12-15 MED ORDER — FUROSEMIDE 10 MG/ML IJ SOLN
INTRAMUSCULAR | Status: AC
  Filled 2018-12-15: qty 4

## 2018-12-15 MED ORDER — ROCURONIUM BROMIDE 50 MG/5ML IV SOLN
50.0000 mg | Freq: Once | INTRAVENOUS | Status: AC
  Administered 2018-12-15: 19:00:00 50 mg via INTRAVENOUS
  Filled 2018-12-15: qty 5

## 2018-12-15 MED ORDER — BISACODYL 10 MG RE SUPP: 10.0000 mg | Freq: Every day | RECTAL | Status: DC | PRN

## 2018-12-15 NOTE — Progress Notes (Signed)
Ran out of time to walk with pt today but brought her educational materials to begin reading. Encouraged her to walk with staff and we will see tomorrow. Gave her MI book, stent card, smoking cessation sheet and diet. Also gave videos to view.  [place], ACSM 3:16 PM 12/15/2018

## 2018-12-15 NOTE — Progress Notes (Signed)
Panic value on ABG called [facility] new orders to make vent changes to improve respiratory status.  Changes made will get another ABG at 2100 per order.

## 2018-12-15 NOTE — Progress Notes (Signed)
Called provider to come to bedside to assess Pt. Pt c/o SOB and increase work of breathing. SATS 95 on 4L. RR 30-40 NSR/ST breathing tx given. Stat CXRAY ordered. P. Hoffman NP at bedside. Will continue to monitor.

## 2018-12-15 NOTE — Procedures (Signed)
Central Venous Catheter Insertion Procedure Note Chelsea Allison [id number] 13-Jul-1955  Procedure: Insertion of Central Venous Catheter Indications: Assessment of intravascular volume, Drug and/or fluid administration and Frequent blood sampling  Procedure Details Consent: Risks of procedure as well as the alternatives and risks of each were explained to the (patient/caregiver).  Consent for procedure obtained. Time Out: Verified patient identification, verified procedure, site/side was marked, verified correct patient position, special equipment/implants available, medications/allergies/relevent history reviewed, required imaging and test results available.  Performed  Maximum sterile technique was used including antiseptics, cap, gloves, gown, hand hygiene, mask and sheet. Skin prep: Chlorhexidine; local anesthetic administered A antimicrobial bonded/coated triple lumen catheter was placed in the left internal jugular vein using the Seldinger technique.  Evaluation Blood flow good Complications: No apparent complications Patient did tolerate procedure well. Chest X-ray ordered to verify placement.  CXR: pending.   Georgann Housekeeper, AGACNP-BC [place] Pager [phone] or [phone]  12/15/2018 7:15 PM

## 2018-12-15 NOTE — Progress Notes (Addendum)
NAME:  Chelsea Allison, MRN:  [id number], DOB:  28-Jan-1956, LOS: 3 ADMISSION DATE:  12/12/2018, CONSULTATION DATE: 12/12/2018 REFERRING MD: Linard Millers, MD, CHIEF COMPLAINT: ST elevation MI, respiratory failure  Brief History   63 year old with severe COPD, lung cancer status post resection, active smoker Admitted with chest elevation MI.  Decompensated in the Cath Lab with hypercarbia and intubated.  PCCM consulted for vent management. She was treated with IABP after cath. Improved and has been extubated. PCCM signed off 10/6, but called back later that day for acute resp distress.   Past Medical History  Severe COPD, prior partial right lung resection for lung cancer 2015, history of colon cancer with nephrostomy.   She has severe COPD, lung cancer on Breo inhaler.  Follows at [facility] pulmonary.  Current active smoker.  Lung cancer was treated with chemotherapy, right upper lobectomy in 2015.  PET scan at [facility] April 2020 shows no evidence of recurrence.  Significant Hospital Events   10/5-Admit with MI, status post cath, intra-aortic balloon pump 10/6 extubated, then resp distress later requiring BiPAP.   Consults:  PCCM  Procedures:  Endotracheal tube 10/5 > 10/6  Significant Diagnostic Tests:    Micro Data:  All negative  Antimicrobials:  Zosyn 10/5>>>10/8 Augmentin 10/9>>>10/11  Interim history/subjective:  Breathing better today. Cough is chronic. No fever/chills.   Objective   Blood pressure 101/64, pulse 80, temperature 98.5 F (36.9 C), temperature source Oral, resp. rate (!) 27, height 5\' 8"  (1.727 m), weight 85.4 kg, SpO2 99 %.    FiO2 (%):  [30 %-40 %] 30 %   Intake/Output Summary (Last 24 hours) at 12/15/2018 0745 Last data filed at 12/15/2018 0600 Gross per 24 hour  Intake 108.38 ml  Output 1350 ml  Net -1241.62 ml   Filed Weights   12/13/18 0600 12/14/18 0600 12/15/18 0600  Weight: 89.6 kg 86.1 kg 85.4 kg    Examination: General:  Elderly  appearing female in NAD Neuro:  Alert, oriented, non-focal HEENT:  [place]/AT, No JVD noted, PERRL Cardiovascular:  RRR, no MRG Lungs:  Very faint expiratory wheeze vs upper airway sounds.  Abdomen:  Soft, non-distended, non-tender Musculoskeletal:  No acute deformity or ROM limitation Skin:  Intact, MMM    Resolved Hospital Problem list   Hypercarbic, hypoxic respiratory failure in the setting of severe COPD, MI  Assessment & Plan:   Respiratory distress: recurrence 10/6 after extubation requiring BiPAP. CXR with RUL opacification, but course felt to be more consistent with pulmonary edema.  Did require further BiPAP on 10/8. She does not have chronic hypercarbia by serum bicarbonate so there should not be a need for nocturnal BiPAP.  Acute pulmonary edema - Titrate O2 for sat of 88-92% - BiPAP PRN - Low threshold to discontinue Zosyn - mobilize, IS, FV - Continue zosyn - Cultures pending - Responded well to diuresis yesterday, per primary.   COPD without acute exacerbation - Target lower sat goal as above - Continue nebs - No role steroids at present - Transition back to home Breo prior to discharge - Quit smoking, discussed  History of lung cancer status post resection, active smoker. Most recent PET with no recurrence  - supportive care  STEMI, cardiogenic shock - Per primary   Labs   CBC: Recent Labs  Lab 12/12/18 0657  12/12/18 2240 12/13/18 0429 12/13/18 2122 12/13/18 2237 12/14/18 0229 12/15/18 0209  WBC 12.8*  --  15.0* 14.8*  --   --  20.5* 15.1*  NEUTROABS 10.7*  --   --   --   --   --   --   --   HGB 16.8*   < > 15.1* 14.2 15.6* 15.6* 15.2* 12.8  HCT 53.9*   < > 46.7* 45.1 46.0 46.0 49.8* 41.9  MCV 90.4  --  87.6 87.2  --   --  89.2 89.5  PLT 327  --  319 344  --   --  299 258   < > = values in this interval not displayed.    Basic Metabolic Panel: Recent Labs  Lab 12/12/18 0729  12/12/18 1131 12/13/18 0429 12/13/18 2122 12/13/18 2237 12/14/18  0229  NA 144   < > 141 141 142 143 140  K 3.4*   < > 3.6 3.1* 4.2 3.4* 3.8  CL 100  --   --  107  --   --  106  CO2  --   --   --  22  --   --  24  GLUCOSE 124*  --   --  162*  --   --  140*  BUN 22  --   --  26*  --   --  29*  CREATININE 0.90  --   --  1.32*  --   --  1.08*  CALCIUM  --   --   --  8.7*  --   --  8.9  MG  --   --   --   --   --   --  2.1   < > = values in this interval not displayed.   GFR: Estimated Creatinine Clearance: 61.8 mL/min (A) (by C-G formula based on SCr of 1.08 mg/dL (H)). Recent Labs  Lab 12/12/18 2240 12/13/18 0429 12/14/18 0229 12/14/18 1049 12/15/18 0209  PROCALCITON  --   --   --  0.48  --   WBC 15.0* 14.8* 20.5*  --  15.1*    Liver Function Tests: Recent Labs  Lab 12/13/18 0429  AST 219*  ALT 69*  ALKPHOS 87  BILITOT 0.2*  PROT 6.2*  ALBUMIN 3.0*   No results for input(s): LIPASE, AMYLASE in the last 168 hours. No results for input(s): AMMONIA in the last 168 hours.  ABG    Component Value Date/Time   PHART 7.308 (L) 12/13/2018 2237   PCO2ART 52.5 (H) 12/13/2018 2237   PO2ART 96.0 12/13/2018 2237   HCO3 26.3 12/13/2018 2237   TCO2 28 12/13/2018 2237   ACIDBASEDEF 1.0 12/13/2018 2237   O2SAT 97.0 12/13/2018 2237     Coagulation Profile: Recent Labs  Lab 12/12/18 0657  INR 1.0    Cardiac Enzymes: No results for input(s): CKTOTAL, CKMB, CKMBINDEX, TROPONINI in the last 168 hours.  HbA1C: Hgb A1c MFr Bld  Date/Time Value Ref Range Status  12/12/2018 11:32 AM 5.4 4.8 - 5.6 % Final    Comment:    (NOTE) Pre diabetes:          5.7%-6.4% Diabetes:              >6.4% Glycemic control for   <7.0% adults with diabetes     CBG: Recent Labs  Lab 12/12/18 Bryant, AGACNP-BC [place] Pager [phone] or [phone]  12/15/2018 7:56 AM  Attending Note:  63 year old female with COPD who presents to PCCM intubated post cath procedure and was extubated but  continues to require BiPAP.  Required  BiPAP again overnight.  On exam, distant BS but clear.  I reviewed CXR myself, RLL infiltrate noted.  Discussed with PCCM-NP.  Will d/c BiPAP.  Patient will need a sleep study as outpatient.  Change zosyn to augmentin for 3 more days.  Hold in the ICU given overnight events, if no events overnight then can leave the ICU in AM.  PCCM will sign off at this point, please call back if needed.  Acute respiratory failure: - BiPAP to PRN  Hypoxemia: - Titrate O2 for sat of 88-92%  COPD: - Nebs as ordered - Transition to breo upon discharge - Will need PFT's as outpatient  Lung cancer: s/p resection - F/U with oncology as outpatient  Patient seen and examined, agree with above note.  I dictated the care and orders written for this patient under my direction.  Rush Farmer, M.D. [facility] Pulmonary/Critical Care Medicine. Pager: [phone]. After hours pager: [phone].  Called back, patient decompensating and wheezing.  Adding steroids and BiPAP.  Will place back on PCCM consult service and diurese.  The patient is critically ill with multiple organ systems failure and requires high complexity decision making for assessment and support, frequent evaluation and titration of therapies, application of advanced monitoring technologies and extensive interpretation of multiple databases.   Critical Care Time devoted to patient care services described in this note is  32  Minutes. This time reflects time of care of this signee Dr Jennet Maduro. This critical care time does not reflect procedure time, or teaching time or supervisory time of PA/NP/Med student/Med Resident etc but could involve care discussion time.  Rush Farmer, M.D. [facility] Pulmonary/Critical Care Medicine. Pager: [phone]. After hours pager: [phone].

## 2018-12-15 NOTE — Procedures (Signed)
Intubation Procedure Note Chelsea Allison [id number] 11/09/1955  Procedure: Intubation Indications: Airway protection and maintenance, Acute hypoxemic respiratory failure   Procedure Details Consent: Unable to obtain consent because of emergent medical necessity. Time Out: Verified patient identification, verified procedure, site/side was marked, verified correct patient position, special equipment/implants available, medications/allergies/relevent history reviewed, required imaging and test results available.  Performed  Maximum sterile technique was used including cap, gloves, gown, hand hygiene, mask and sheet.  MAC blade 3 video laryngoscope. Grade 1 view of VC. Easy pass of ETT.   Patient sedated with versed, fent, etomidate and rocuronium.   There was a visible hematoma/bruising of the posterior soft palate. I suspect from the previous ETT placement.   Evaluation Hemodynamic Status: BP stable throughout; O2 sats: stable throughout Patient's Current Condition: stable Complications: No apparent complications Patient did tolerate procedure well. Chest X-ray ordered to verify placement.  CXR: pending.   [name] 12/15/2018

## 2018-12-15 NOTE — Progress Notes (Signed)
PCCM INTERVAL PROGRESS NOTE  Called to bedside for patient with respiratory distress. Seems to follow a trend where around this time the past couple days she does this. She is tachypneic with R>L wheeze. She has a history of COPD, but has been treated for CHF and pulmonary edema this admission. O2 sats remain stable on 4L [place].   Lung ultrasound not overtly concerning for pulmonary edema. Awaiting CXR.   Plan: Supplemental O2  Restart BiPAP CXR pending If CXR clear will escalate COPD treatment.     Georgann Housekeeper, AGACNP-BC [place] Pager [phone] or [phone]  12/15/2018 5:36 PM

## 2018-12-15 NOTE — Progress Notes (Signed)
  Speech Language Pathology Treatment: Dysphagia  Patient Details Name: Chelsea Allison MRN: [id number] DOB: 1955-05-17 Today's Date: 12/15/2018 Time: 0912-0921 SLP Time Calculation (min) (ACUTE ONLY): 9 min  Assessment / Plan / Recommendation Clinical Impression  Pt tolerated all consistencies trialed with no clinical s/s of aspiration and exhibited excellent oral clearance.  Pt has been changed to regular texture diet and both she and RN report no difficulty with PO intake.  Pt reports her throat feels better than it did yesterday, now 2 days post extubation.  Reviewed general aspiration precautions, especially in light of severe COPD.  SLP will sign off at this time.     HPI HPI: Pt is a 63 yo female admitted with MI and then decompensated in the cath lab requiring intubation 10/5-10/6. Overnight on 10/6 she had increased WOB requiring BiPAP and CXR showed a new RUL infiltrate. PMH: severe COPD, lung cancer s/p partial R lung resection, colon cancer with nephrostomy, active smoker, HTN      SLP Plan  All goals met       Recommendations  Diet recommendations: Regular;Thin liquid Liquids provided via: Straw;Cup Medication Administration: Whole meds with liquid Supervision: Patient able to self feed Compensations: Slow rate;Small sips/bites(Rest breaks with increased WOB) Postural Changes and/or Swallow Maneuvers: Seated upright 90 degrees                Oral Care Recommendations: Oral care BID Follow up Recommendations: None SLP Visit Diagnosis: Dysphagia, unspecified (R13.10) Plan: All goals met       [place], MA, [place] Office: [phone]; Pager (10/8): [phone] 12/15/2018, 9:57 AM

## 2018-12-15 NOTE — Progress Notes (Signed)
Pt intubated d/t eccessive work of breathing and change in mental status. Judyann Munson MD Icard at bedside

## 2018-12-15 NOTE — Progress Notes (Addendum)
PCCM:  Called to bedside. Patient ins severe ARF. Not tolerated BIPAP. Mottled in appearance. Bedside ECHO with depressed LVEF and Blines in lungs. Decision was made for ETT.  BP 115/66   Pulse (!) 120   Temp (!) 96.7 F (35.9 C) (Axillary)   Resp (!) 32   Ht 5\' 8"  (1.727 m)   Wt 85.4 kg   SpO2 95%   BMI 28.63 kg/m   Gen: acute distress Heart: RRR, s1 s2  Lungs: minimal air movement   A: AHRF Minimal air movement  Hypoxemia  Poor vascular access  P: ETT placement  CVC placement  STAT labs  ECG STAT CXR STAT  Vent Mode: Other (Comment) FiO2 (%):  [30 %-100 %] 100 % Set Rate:  [16 bmp-22 bmp] 16 bmp Vt Set:  [480 mL-510 mL] 480 mL PEEP:  [5 cmH20-10 cmH20] 10 cmH20 Plateau Pressure:  [32 cmH20] 32 cmH20  Repeat abg in hour   This patient is critically ill with multiple organ system failure; which, requires frequent high complexity decision making, assessment, support, evaluation, and titration of therapies. This was completed through the application of advanced monitoring technologies and extensive interpretation of multiple databases. During this encounter critical care time was devoted to patient care services described in this note for 48 minutes.   _____________________________________________________________  I attempted to call patients family to update them on her clinical situation.  There was no phone numbers in the patient contact within the chart.  I will have nursing staff or social work help [place] reach them.  Garner Nash, DO [place] [facility] Critical [facility] 12/15/2018 6:49 PM     I spoke with Cheri patients sister. She was appreciative of the patients care.  Garner Nash, DO [place] [facility] 12/15/2018 6:56 PM

## 2018-12-15 NOTE — Progress Notes (Signed)
Pt was placed on BiPAP per NP request. Pt is on 16/6 and 60%. It took several minutes for pt's saturations to come up into the 90's. Pt is very agitated at this moment while on the BiPAP. Pt not tolerating well, NP still wishes to keep the BiPAP on pt. RT will continue to monitor pt status.

## 2018-12-15 NOTE — Progress Notes (Signed)
Progress Note  Patient Name: Chelsea Allison Date of Encounter: 12/15/2018  Primary Cardiologist: Sinclair Grooms, MD   Subjective   Ms. Tancredi reports she is doing well and actually slept better yesterday.  She required BiPAP but does not report of any shortness of breath.  Inpatient Medications    Scheduled Meds: . aspirin  81 mg Per Tube Daily  . atorvastatin  80 mg Oral q1800  . bisoprolol  5 mg Oral Daily  . budesonide (PULMICORT) nebulizer solution  0.5 mg Nebulization BID  . chlorhexidine  15 mL Mouth Rinse BID  . Chlorhexidine Gluconate Cloth  6 each Topical Daily  . heparin  5,000 Units Subcutaneous Q8H  . levalbuterol  0.63 mg Nebulization Q6H  . mouth rinse  15 mL Mouth Rinse q12n4p  . pantoprazole  40 mg Oral Daily  . sodium chloride flush  3 mL Intravenous Q12H  . ticagrelor  90 mg Oral BID   Continuous Infusions: . sodium chloride    . sodium chloride 500 mL (12/12/18 2130)  . piperacillin-tazobactam (ZOSYN)  IV 3.375 g (12/15/18 0547)   PRN Meds: sodium chloride, acetaminophen, ALPRAZolam, bisacodyl, levalbuterol, LORazepam, nitroGLYCERIN, ondansetron (ZOFRAN) IV, phenol, sennosides, sodium chloride flush, zolpidem   Vital Signs    Vitals:   12/15/18 0200 12/15/18 0414 12/15/18 0510 12/15/18 0600  BP: 101/66  121/80 101/64  Pulse: 77  85 80  Resp: 18  (!) 24 (!) 27  Temp:  (!) 96.8 F (36 C)    TempSrc:  Axillary    SpO2: 98%  99% 99%  Weight:      Height:        Intake/Output Summary (Last 24 hours) at 12/15/2018 0640 Last data filed at 12/14/2018 2200 Gross per 24 hour  Intake 108.38 ml  Output 1000 ml  Net -891.62 ml   Filed Weights   12/12/18 1100 12/13/18 0600 12/14/18 0600  Weight: 86 kg 89.6 kg 86.1 kg    Telemetry    PVCs, NSVT though most likely artifact- Personally Reviewed  ECG    None today- Personally Reviewed  Physical Exam   GEN: No acute distress.   Cardiac: RRR, no murmurs, rubs, or gallops.  Respiratory:  Minimal  wheezing appreciated to posterior lung lobes otherwise unremarkable MS: No edema; No deformity. Neuro:  Nonfocal  Psych: Normal affect   Labs    Chemistry Recent Labs  Lab 12/12/18 0729  12/13/18 0429 12/13/18 2122 12/13/18 2237 12/14/18 0229  NA 144   < > 141 142 143 140  K 3.4*   < > 3.1* 4.2 3.4* 3.8  CL 100  --  107  --   --  106  CO2  --   --  22  --   --  24  GLUCOSE 124*  --  162*  --   --  140*  BUN 22  --  26*  --   --  29*  CREATININE 0.90  --  1.32*  --   --  1.08*  CALCIUM  --   --  8.7*  --   --  8.9  PROT  --   --  6.2*  --   --   --   ALBUMIN  --   --  3.0*  --   --   --   AST  --   --  219*  --   --   --   ALT  --   --  69*  --   --   --  ALKPHOS  --   --  87  --   --   --   BILITOT  --   --  0.2*  --   --   --   GFRNONAA  --   --  43*  --   --  55*  GFRAA  --   --  50*  --   --  >60  ANIONGAP  --   --  12  --   --  10   < > = values in this interval not displayed.     Hematology Recent Labs  Lab 12/13/18 0429  12/13/18 2237 12/14/18 0229 12/15/18 0209  WBC 14.8*  --   --  20.5* 15.1*  RBC 5.17*  --   --  5.58* 4.68  HGB 14.2   < > 15.6* 15.2* 12.8  HCT 45.1   < > 46.0 49.8* 41.9  MCV 87.2  --   --  89.2 89.5  MCH 27.5  --   --  27.2 27.4  MCHC 31.5  --   --  30.5 30.5  RDW 14.1  --   --  14.6 14.6  PLT 344  --   --  299 258   < > = values in this interval not displayed.    Cardiac EnzymesNo results for input(s): TROPONINI in the last 168 hours. No results for input(s): TROPIPOC in the last 168 hours.   BNP Recent Labs  Lab 12/12/18 1132  BNP 248.0*     DDimer No results for input(s): DDIMER in the last 168 hours.   Radiology    Dg Chest Port 1 View  Result Date: 12/13/2018 CLINICAL DATA:  63 year old with acute hypoxia. EXAM: PORTABLE CHEST 1 VIEW 9:01 p.m.: COMPARISON:  Portable chest x-ray earlier same day at 5 o'clock a.m. and previously. FINDINGS: Extubation and removal of the Swan-Ganz catheter since the examination this  morning. Interval development of airspace opacities in the RIGHT UPPER LOBE. Pleuroparenchymal scarring on the RIGHT with tethering of the RIGHT hemidiaphragm, unchanged. Pulmonary venous hypertension and perhaps mild interstitial pulmonary edema. Cardiac silhouette normal in size for AP portable technique. IMPRESSION: 1. Acute pneumonia in the RIGHT UPPER LOBE since the examination this morning, possibly aspiration. 2. Pulmonary venous hypertension and perhaps mild interstitial pulmonary edema, query fluid overload. Electronically Signed   By: Evangeline Dakin M.D.   On: 12/13/2018 21:09    Cardiac Studies   Echocardiogram December 12, 2018 1. Technically difficult study with limited views. Left ventricular ejection fraction, by visual estimation, is 35 to 40%. Hypokinesis of apex, anterior/septal/lateral apical walls, mid to basal inferolateral wall 2. Global right ventricle has normal systolic function.The right ventricular size is normal.  FINDINGS Left Ventricle: Left ventricular ejection fraction, by visual estimation, is 35 to 40%. The left ventricle has moderately decreased function. There is mildly increased left ventricular hypertrophy. Concentric left ventricular hypertrophy. Spectral  Doppler shows Left ventricular diastolic Doppler parameters are consistent with impaired relaxation pattern of LV diastolic filling.  Right Ventricle: The right ventricular size is normal. No increase in right ventricular wall thickness. Global RV systolic function is has normal systolic function.  Left Atrium: Left atrial size was normal in size.  Right Atrium: Right atrial size was not well visualized  Pericardium: The pericardium was not well visualized.  Mitral Valve: The mitral valve was not well visualized. Not assessed mitral valve regurgitation.  Tricuspid Valve: The tricuspid valve is not well visualized. Tricuspid valve regurgitation was not assessed  by color flow Doppler.  Aortic  Valve: The aortic valve was not well visualized. Aortic valve regurgitation was not assessed by color flow Doppler.  Pulmonic Valve: The pulmonic valve was not well visualized. Pulmonic valve regurgitation was not assessed by color flow Doppler.  Aorta: The aortic root was not well visualized.  IAS/Shunts: The interatrial septum was not well visualized.   December 12, 2018-Left heart cath and coronary angiography   Acute lateral wall myocardial infarction due to thrombotic occlusion of the circumflex.  High-grade obstruction of the proximal RCA with collaterals to the apical LAD  Chronic total occlusion of the proximal LAD.  PTCA and stent implantation proximal to mid circumflex reducing 100% stenosis to less than 10% with TIMI grade III flow. Final post dual balloon diameter 2.83 mm using A 22 x 2.75 Onyx. No postdilatation was performed.  Mid anterior wall akinesis with anteroapical hypokinesis. LVEDP 25 mmHg.  Mean pulmonary capillary wedge pressure 7 mmHg, PA pressure 24 over 12 mmHg.  Intra-aortic balloon pump at 1:1 pumping.  Altered mental status prior to intubation. Etiology is unclear but likely related to CO2 narcosis. Rule out intracranial hemorrhage/bleed in the setting of intense anticoagulation.  Patient Profile     63 y.o.femalewith medical history significant for COPD, lung cancer status post pneumonectomy, tobacco use disorder, colon cancer with colostomy who presented to [facility], ED on December 12, 2018 with chest pain found to have STEMI found to have high-grade obstruction of proximal RCA status post PTCA and stent implantation of proximal to mid circumflex. During her coronary intervention, she developed acute encephalopathy and hypercarbia with PCO2 of 81 which warranted emergent intubation. Due to hypoxia and elevated left ventricular end-diastolic pressure of 28 mm per mercury, and intra-aortic balloon pump was inserted.  Assessment & Plan     #STEMI #s/p PTCA and stent implantation proximal to mid circumflex  -Continue aspirin, Lipitor, Brilinta  #Systolic heart failure (EF 35-40%) -Continue bisoprolol 5 mg daily  #Acute encephalopathy secondary to hypercarbia #Hypercarbic/hypoxicrespiratory failure #COPD -Continue BiPAP prn SOB  -Appreciate assistance from PCCM  #Probable right upper lobe pneumonia Blood and sputum Cx NGTD.  She has remained afebrile and leukocytosis is improving.  She is stable to transfer out of the ICU to the floor -Continue Zosyn but will likely d/c today    For questions or updates, please contact [place] Please consult [email or web address] for contact info under Cardiology/STEMI.      Signed, Jean Rosenthal, MD  12/15/2018, 6:40 AM

## 2018-12-15 NOTE — Progress Notes (Signed)
PHARMACIST STATIN AND CHOLESTEROL MONITORING  Roshawnda Pecora is a 63 y.o. female admitted on 12/12/2018 with STEMI on sub optimal statin therapy for the following indication:  [x]  Secondary prevention for clinical ASCVD []  Primary prevention for LDL > 190 mg/dL []  Primary prevention DM age 52-75 years with LDL 70-189 mg/dL []  Primary prevention, no diabetes, estimated 10 year ASVD risk > 7.5% and age 26-75 with LDL 70-189 mg/dL   Reasons that warrant discontinuation or lower statin intensity []  Documented hypersensitivity or allergy [x]  history of muscle pain with atorvastatin []  Elevated LFTs or active liver disease []  Intolerance for other reasons: ___________ []  Patient preference []  No reason found   Discussed with patient about statin therapy preference: Yes  Plan  -Will start crestor 10mg  po daily -Consider lipid clinic referral  Hildred Laser, PharmD Clinical Pharmacist **Pharmacist phone directory can now be found on [email or web address] ([email or web address]).  Listed under [place].

## 2018-12-15 NOTE — Progress Notes (Signed)
CRITICAL VALUE STICKER  CRITICAL VALUE: LA 2.2  RECEIVER (on-site recipient of call): Leafy Kindle RN  [place] NOTIFIED: 12/15/2018 @ 2000  MESSENGER (representative from lab): lab  MD NOTIFIED: Dr Deterding  TIME OF NOTIFICATION:2145  RESPONSE: orders to trend LA

## 2018-12-16 ENCOUNTER — Inpatient Hospital Stay (HOSPITAL_COMMUNITY): Payer: Medicare HMO

## 2018-12-16 DIAGNOSIS — I219 Acute myocardial infarction, unspecified: Secondary | ICD-10-CM

## 2018-12-16 LAB — ECHOCARDIOGRAM LIMITED
Height: 68 in
Weight: 3022.95 oz

## 2018-12-16 LAB — LACTIC ACID, PLASMA: Lactic Acid, Venous: 1.8 mmol/L (ref 0.5–1.9)

## 2018-12-16 LAB — CULTURE, RESPIRATORY W GRAM STAIN: Culture: NORMAL

## 2018-12-16 LAB — GLUCOSE, CAPILLARY
Glucose-Capillary: 137 mg/dL — ABNORMAL HIGH (ref 70–99)
Glucose-Capillary: 125 mg/dL — ABNORMAL HIGH (ref 70–99)

## 2018-12-16 LAB — BASIC METABOLIC PANEL
Anion gap: 14 (ref 5–15)
BUN: 39 mg/dL — ABNORMAL HIGH (ref 8–23)
CO2: 26 mmol/L (ref 22–32)
Calcium: 9 mg/dL (ref 8.9–10.3)
Chloride: 101 mmol/L (ref 98–111)
Creatinine, Ser: 1.13 mg/dL — ABNORMAL HIGH (ref 0.44–1.00)
GFR calc Af Amer: >60 mL/min (ref >60)
GFR calc non Af Amer: 52 mL/min — ABNORMAL LOW (ref >60)
Glucose, Bld: 184 mg/dL — ABNORMAL HIGH (ref 70–99)
Potassium: 3.4 mmol/L — ABNORMAL LOW (ref 3.5–5.1)
Sodium: 141 mmol/L (ref 135–145)

## 2018-12-16 LAB — CBC
HCT: 43.8 % (ref 36.0–46.0)
Hemoglobin: 13.5 g/dL (ref 12.0–15.0)
MCH: 27.8 pg (ref 26.0–34.0)
MCHC: 30.8 g/dL (ref 30.0–36.0)
MCV: 90.3 fL (ref 80.0–100.0)
Platelets: 246 10*3/uL (ref 150–400)
RBC: 4.85 MIL/uL (ref 3.87–5.11)
RDW: 14.3 % (ref 11.5–15.5)
WBC: 13.1 10*3/uL — ABNORMAL HIGH (ref 4.0–10.5)
nRBC: 0 % (ref 0.0–0.2)

## 2018-12-16 LAB — COOXEMETRY PANEL
Carboxyhemoglobin: 0.9 % (ref 0.5–1.5)
Methemoglobin: 0.9 % (ref 0.0–1.5)
O2 Saturation: 78.2 %
Total hemoglobin: 13.4 g/dL (ref 12.0–16.0)

## 2018-12-16 LAB — TRIGLYCERIDES: Triglycerides: 187 mg/dL — ABNORMAL HIGH (ref <150)

## 2018-12-16 MED ORDER — PERFLUTREN LIPID MICROSPHERE
1.0000 mL | INTRAVENOUS | Status: AC | PRN
  Administered 2018-12-16: 08:00:00 4 mL via INTRAVENOUS
  Filled 2018-12-16: qty 10

## 2018-12-16 MED ORDER — METHYLPREDNISOLONE SODIUM SUCC 40 MG IJ SOLR
40.0000 mg | Freq: Two times a day (BID) | INTRAMUSCULAR | Status: DC
  Administered 2018-12-16 – 2018-12-17 (×2): 40 mg via INTRAVENOUS
  Filled 2018-12-16 (×2): qty 1

## 2018-12-16 MED ORDER — PANTOPRAZOLE SODIUM 40 MG IV SOLR
40.0000 mg | Freq: Every day | INTRAVENOUS | Status: DC
  Administered 2018-12-16 – 2018-12-17 (×2): 40 mg via INTRAVENOUS
  Filled 2018-12-16 (×2): qty 40

## 2018-12-16 MED ORDER — VITAL AF 1.2 CAL PO LIQD
1000.0000 mL | ORAL | Status: DC
  Administered 2018-12-16: 12:00:00 1000 mL

## 2018-12-16 MED ORDER — ACETAMINOPHEN 325 MG PO TABS
650.0000 mg | ORAL_TABLET | Freq: Four times a day (QID) | ORAL | Status: DC | PRN
  Administered 2018-12-16 – 2018-12-17 (×2): 650 mg
  Filled 2018-12-16 (×2): qty 2

## 2018-12-16 MED ORDER — POTASSIUM CHLORIDE 20 MEQ/15ML (10%) PO SOLN
40.0000 meq | ORAL | Status: AC
  Administered 2018-12-16 (×2): 40 meq
  Filled 2018-12-16 (×2): qty 30

## 2018-12-16 MED ORDER — VITAL HIGH PROTEIN PO LIQD: 1000.0000 mL | ORAL | Status: DC

## 2018-12-16 MED ORDER — SODIUM CHLORIDE 0.9% FLUSH: 10.0000 mL | INTRAVENOUS | Status: DC | PRN

## 2018-12-16 NOTE — Progress Notes (Signed)
Initial Nutrition Assessment  RD working remotely.  DOCUMENTATION CODES:   Not applicable  INTERVENTION:   Initiate trickle tube feeds via OG tube: - Vital AF 1.2 @ 20 ml/hr (480 ml/day)  Trickle tube feeding regimen provides 576 kcal, 36 grams of protein, and 389 ml of H2O.   Trickle tube feeding regimen and current propofol provides 710 total kcal.   If unable to extubate, recommend advancing to goal regimen: - Vital AF 1.2 @ 60 ml/hr (1440 ml/day)  Goal tube feeding regimen provides 1728 kcal, 108 grams of protein, and 1168 ml of H2O.   Goal tube feeding regimen and current propofol provides 1862 total kcal (100% of needs).  NUTRITION DIAGNOSIS:   Inadequate oral intake related to inability to eat as evidenced by NPO status.  GOAL:   Patient will meet greater than or equal to 90% of their needs  MONITOR:   Vent status, Labs, Weight trends, TF tolerance, I & O's  REASON FOR ASSESSMENT:   Ventilator, Consult Enteral/tube feeding initiation and management  ASSESSMENT:   63 year old female who presented on 10/5 with cheat pain. Code STEMI called. PMH of HTN, lung cancer s/p resection, colon cancer with rectal cancer s/p colostomy, COPD. Pt taken emergently to the cath lab and is s/p PTCA and stent implantation of the proximal to mid circumflex.   10/6 - extubated, respiratory distress requiring BiPAP 10/8 - respiratory distress requiring intubation  RD consulted to start trickle TF. Per CCM, hopeful for extubation soon. Adult ICU Trickle Tube Feeding Protocol ordered. RD will adjust to better meet pt's needs.  OG tube in place.  Meal Completion: 50-100% x 2 meals prior to intubation  Patient is currently intubated on ventilator support MV: 14 L/min Temp (24hrs), Avg:99.2 F (37.3 C), Min:96.7 F (35.9 C), Max:101.6 F (38.7 C) BP (cuff): 117/74 MAP (cuff): 87  Drips: Propofol: 5.1 ml/hr (provides 134 kcal daily from lipid) Levophed: 7.5  ml/hr  Medications reviewed and include: Solu-medrol, Protonix, KCl 40 mEq x 2, IV abx  Labs reviewed: potassium 3.4, BUN 39, creatinine 1.13 CBG: 278  UOP: 1250 ml x 24 hours OGT: 130 ml x 24 hours Stool: 400 ml x 24 hours via colostomy I/O's: -2.2 L since admit  NUTRITION - FOCUSED PHYSICAL EXAM:  Unable to complete at this time. RD working remotely.  Diet Order:   Diet Order            Diet NPO time specified  Diet effective now              EDUCATION NEEDS:   No education needs have been identified at this time  Skin:  Skin Assessment: Reviewed RN Assessment  Last BM:  12/15/18 colostomy  Height:   Ht Readings from Last 1 Encounters:  12/12/18 5\' 8"  (1.727 m)    Weight:   Wt Readings from Last 1 Encounters:  12/16/18 85.7 kg    Ideal Body Weight:  63.6 kg  BMI:  Body mass index is 28.73 kg/m.  Estimated Nutritional Needs:   Kcal:  1862  Protein:  105-125 grams  Fluid:  >/= 1.8 L    Gaynell Face, MS, RD, LDN Inpatient Clinical Dietitian Pager: [phone] Weekend/After Hours: [phone]

## 2018-12-16 NOTE — Progress Notes (Signed)
Inpatient Diabetes Program Recommendations  AACE/ADA: New Consensus Statement on Inpatient Glycemic Control (2015)  Target Ranges:  Prepandial:   less than 140 mg/dL      Peak postprandial:   less than 180 mg/dL (1-2 hours)      Critically ill patients:  140 - 180 mg/dL   Lab Results  Component Value Date   GLUCAP 278 (H) 12/15/2018   HGBA1C 5.4 12/12/2018    Review of Glycemic Control Results for Chelsea Allison, Chelsea Allison (MRN [id number]) as of 12/16/2018 11:24  Ref. Range 12/16/2018 05:05  Glucose Latest Ref Range: 70 - 99 mg/dL 184 (H)  Diabetes history: no hx noted Outpatient Diabetes medications: none Current orders for Inpatient glycemic control: none Solumedrol 40 mg BID Vital 1.2 @20ml /hr CBGs Q4H  Inpatient Diabetes Program Recommendations:    Noted AM glucose serum in presence of steroids. Anticipate elevations, especially with start of tube feeds. Noted new order for CBG Q4H to start at 1200.  If CBGs >180 mg/dL, consider adding Novolog 2-6 units Q4H.   Thanks, Bronson Curb, MSN, RNC-OB Diabetes Coordinator [phone] (8a-5p)

## 2018-12-16 NOTE — Consult Note (Signed)
[place] Nurse ostomy consult note Stoma type/location: LLQ, end colostomy, performed at outside hospital. Patient intubated not able to provide any history Stomal assessment/size: aprox. 1 1/2" budded, stoma, pink, visualized through pouch  Peristomal assessment: NA Treatment options for stomal/peristomal skin: NA Output pasty brown stool  Ostomy pouching: 1pc. With clip closure in place from home Ordered 1pc flat with lock and roll closure for use while inpatient for nursing staff Education provided: NA Enrolled patient in [place] program: no  Discussed POC with bedside nurse.  Re consult if needed, will not follow at this time. Thanks  Kabe Mckoy [employer], RN,CWOCN, CNS, [place] [phone])

## 2018-12-16 NOTE — TOC Initial Note (Addendum)
Transition of Care [facility]) - Initial/Assessment Note    Patient Details  Name: Chelsea Allison MRN: [id number] Date of Birth: 1955/12/24  Transition of [facility]) CM/SW Contact:    Eileen Stanford, LCSW Phone Number: 12/16/2018, 11:53 AM  Clinical Narrative:        Pt is currently on ventilator. CSW spoke with pt's sister via telephone. Pt lives home alone. Pt's sister understanding that pt will probably need SNF at d/c, however pt's sister knows nothing about any SNF's. Pt's sister is hopeful pt will come off the vent and be able to deteremine where she will go. CSW will continue to follow and will update FL2 once pt is more medically stable.        Expected Discharge Plan: Skilled Nursing Facility Barriers to Discharge: Continued Medical Work up   Patient Goals and CMS Choice   CMS Medicare.gov Compare Post Acute Care list provided to:: Patient Represenative (must comment) Choice offered to / list presented to : Sibling  Expected Discharge Plan and Services Expected Discharge Plan: [place] In-house Referral: NA   Post Acute Care Choice: [place] Living arrangements for the past 2 months: Single Family Home                                      Prior Living Arrangements/Services Living arrangements for the past 2 months: Single Family Home Lives with:: Self Patient language and need for interpreter reviewed:: Yes Do you feel safe going back to the place where you live?: Yes      Need for Family Participation in Patient Care: Yes (Comment) Care giver support system in place?: Yes (comment)   Criminal Activity/Legal Involvement Pertinent to Current Situation/Hospitalization: No - Comment as needed  Activities of Daily Living Home Assistive Devices/Equipment: None ADL Screening (condition at time of admission) Patient's cognitive ability adequate to safely complete daily activities?: Yes Is the patient deaf or have difficulty hearing?:  No Does the patient have difficulty seeing, even when wearing glasses/contacts?: No Does the patient have difficulty concentrating, remembering, or making decisions?: No Patient able to express need for assistance with ADLs?: Yes Does the patient have difficulty dressing or bathing?: No Independently performs ADLs?: Yes (appropriate for developmental age) Does the patient have difficulty walking or climbing stairs?: No Weakness of Legs: None Weakness of Arms/Hands: None  Permission Sought/Granted Permission sought to share information with : Family Supports    Share Information with NAME: Sherrie     Permission granted to share info w Relationship: sister  Permission granted to share info w Contact Information: [phone]  Emotional Assessment Appearance:: Appears stated age Attitude/Demeanor/Rapport: Unable to Assess Affect (typically observed): Unable to Assess Orientation: : (pt on vent) Alcohol / Substance Use: Not Applicable Psych Involvement: No (comment)  Admission diagnosis:  ST elevation myocardial infarction (STEMI), unspecified artery (HCC) [I21.3] Acute MI, lateral wall ([place]) [I21.29] Patient Active Problem List   Diagnosis Date Noted  . Hypoxemia   . Acute respiratory failure with hypoxemia ([place])   . Acute pulmonary edema (HCC)   . COPD with acute exacerbation ([place])   . STEMI (ST elevation myocardial infarction) ([place]) 12/12/2018  . Acute MI, lateral wall ([place]) 12/12/2018  . Acute on chronic respiratory failure with hypercapnia ([place])   . Acute combined systolic and diastolic heart failure ([place])    PCP:  Beckie Salts, MD Pharmacy:   [facility] -  [place], [place] - [address] [place] [address] Phone: [phone] Fax: [phone]     Social Determinants of Health (SDOH) Interventions    Readmission Risk Interventions No flowsheet data found.

## 2018-12-16 NOTE — Progress Notes (Signed)
Progress Note  Patient Name: Chelsea Allison Date of Encounter: 12/16/2018  Primary Cardiologist: Sinclair Grooms, MD   Subjective   Patient now intubated. Comfortable with breathing. No chest pain.   Inpatient Medications    Scheduled Meds:  aspirin  81 mg Per Tube Daily   bisoprolol  5 mg Per Tube Daily   budesonide (PULMICORT) nebulizer solution  0.5 mg Nebulization BID   chlorhexidine gluconate (MEDLINE KIT)  15 mL Mouth Rinse BID   Chlorhexidine Gluconate Cloth  6 each Topical Daily   heparin  5,000 Units Subcutaneous Q8H   ipratropium-albuterol  3 mL Nebulization Q4H   mouth rinse  15 mL Mouth Rinse 10 times per day   methylPREDNISolone (SOLU-MEDROL) injection  40 mg Intravenous Q6H   pantoprazole sodium  40 mg Per Tube Q24H   potassium chloride  40 mEq Per Tube Q4H   rosuvastatin  10 mg Per Tube q1800   sodium chloride flush  3 mL Intravenous Q12H   ticagrelor  90 mg Oral BID   Continuous Infusions:  sodium chloride 10 mL/hr at 12/15/18 2010   cefTRIAXone (ROCEPHIN)  IV Stopped (12/15/18 2042)   norepinephrine (LEVOPHED) Adult infusion 2 mcg/min (12/16/18 0800)   propofol (DIPRIVAN) infusion 10 mcg/kg/min (12/16/18 0800)   PRN Meds: acetaminophen, albuterol, bisacodyl, docusate, fentaNYL (SUBLIMAZE) injection, midazolam, nitroGLYCERIN, ondansetron (ZOFRAN) IV, perflutren lipid microspheres (DEFINITY) IV suspension, sennosides, sodium chloride flush, sodium chloride flush   Vital Signs    Vitals:   12/16/18 0700 12/16/18 0725 12/16/18 0727 12/16/18 0800  BP: 106/68  107/71 115/67  Pulse: 70  72 74  Resp: (!) 24  (!) 24 (!) 24  Temp:   99.1 F (37.3 C)   TempSrc:   Oral   SpO2: 98% 100% 100% 98%  Weight:      Height:        Intake/Output Summary (Last 24 hours) at 12/16/2018 0825 Last data filed at 12/16/2018 0800 Gross per 24 hour  Intake 664.51 ml  Output 1680 ml  Net -1015.49 ml   Filed Weights   12/14/18 0600 12/15/18 0600  12/16/18 0500  Weight: 86.1 kg 85.4 kg 85.7 kg    Telemetry    NSR- Personally Reviewed  ECG    None today- Personally Reviewed  Physical Exam   GEN: No acute distress.  Intubated. Sedated but awake Cardiac: RRR, no murmurs, rubs, or gallops.  Respiratory:  clear MS: No edema; No deformity. Neuro:  Nonfocal  Psych: Normal affect   Labs    Chemistry Recent Labs  Lab 12/13/18 0429  12/15/18 0729  12/15/18 1951 12/15/18 2117 12/16/18 0505  NA 141   < > 141   < > 142 143 141  K 3.1*   < > 3.5   < > 3.9 3.3* 3.4*  CL 107   < > 102  --  100  --  101  CO2 22   < > 26  --  27  --  26  GLUCOSE 162*   < > 118*  --  285*  --  184*  BUN 26*   < > 42*  --  37*  --  39*  CREATININE 1.32*   < > 1.12*  --  1.25*  --  1.13*  CALCIUM 8.7*   < > 9.5  --  9.1  --  9.0  PROT 6.2*  --   --   --   --   --   --  ALBUMIN 3.0*  --   --   --   --   --   --   AST 219*  --   --   --   --   --   --   ALT 69*  --   --   --   --   --   --   ALKPHOS 87  --   --   --   --   --   --   BILITOT 0.2*  --   --   --   --   --   --   GFRNONAA 43*   < > 53*  --  46*  --  52*  GFRAA 50*   < > >60  --  53*  --  >60  ANIONGAP 12   < > 13  --  15  --  14   < > = values in this interval not displayed.     Hematology Recent Labs  Lab 12/15/18 0209  12/15/18 1951 12/15/18 2117 12/16/18 0505  WBC 15.1*  --  23.9*  --  13.1*  RBC 4.68  --  5.33*  --  4.85  HGB 12.8   < > 15.1* 15.0 13.5  HCT 41.9   < > 49.1* 44.0 43.8  MCV 89.5  --  92.1  --  90.3  MCH 27.4  --  28.3  --  27.8  MCHC 30.5  --  30.8  --  30.8  RDW 14.6  --  14.4  --  14.3  PLT 258  --  320  --  246   < > = values in this interval not displayed.    Cardiac EnzymesNo results for input(s): TROPONINI in the last 168 hours. No results for input(s): TROPIPOC in the last 168 hours.   BNP Recent Labs  Lab 12/12/18 1132  BNP 248.0*     DDimer No results for input(s): DDIMER in the last 168 hours.   Radiology    Dg Chest Port 1  View  Result Date: 12/15/2018 CLINICAL DATA:  Central line placement. EXAM: PORTABLE CHEST 1 VIEW COMPARISON:  Earlier same day as well as 12/13/2018 and 12/12/2018 FINDINGS: Endotracheal tube has tip 4.6 cm from the carina. Interval placement of left IJ central venous catheter with tip over the SVC. Lungs are adequately inflated with stable chronic volume loss of the right lung. No pneumothorax. Persistent airspace opacification over the right mid to upper lung likely infection. Postsurgical/post radiation changes over the right paramediastinal region. Remainder of the exam is unchanged. IMPRESSION: Stable hazy airspace process over the right mid to upper lung likely infection. Chronic postsurgical/post radiation change and volume loss of the right lung. Tubes and lines as described.  No pneumothorax. Electronically Signed   By: Marin Olp M.D.   On: 12/15/2018 19:43   Portable Chest X-ray  Result Date: 12/15/2018 CLINICAL DATA:  Endotracheal tube placement. EXAM: PORTABLE CHEST 1 VIEW COMPARISON:  12/15/2018 earlier. FINDINGS: Interval placement of endotracheal tube with tip 4.6 cm above the carina. Persistent hazy airspace opacification over the right mid to upper lung likely infection. Postsurgical/post radiation change over the right paramediastinal region with stable volume loss of the right lung. No effusion. Cardiomediastinal silhouette and remainder of the exam is unchanged. IMPRESSION: Persistent airspace process over the right mid to upper lung likely infection. Endotracheal tube with tip 4.6 cm above the carina. Electronically Signed   By: Marin Olp M.D.   On:  12/15/2018 18:59   Dg Chest Port 1 View  Result Date: 12/15/2018 CLINICAL DATA:  Shortness of breath. Known history of right lung cancer. EXAM: PORTABLE CHEST 1 VIEW COMPARISON:  12/13/2018 FINDINGS: Lungs are adequately inflated and demonstrate slight worsening airspace opacification over the right mid to upper lung likely  infection. No evidence of effusion. Postsurgical/radiation change over the right paramediastinal region. Cardiomediastinal silhouette and remainder the exam is unchanged. IMPRESSION: Worsening airspace process over the right mid to upper lung likely infection. Electronically Signed   By: Marin Olp M.D.   On: 12/15/2018 18:57   Dg Abd Portable 1v  Result Date: 12/15/2018 CLINICAL DATA:  Enteric tube placement. EXAM: PORTABLE ABDOMEN - 1 VIEW COMPARISON:  Chest x-ray from same day. FINDINGS: Enteric tube in the mid stomach. Visualized bowel gas pattern is normal. IMPRESSION: Enteric tube in the stomach. Electronically Signed   By: Titus Dubin M.D.   On: 12/15/2018 21:58    Cardiac Studies   Echocardiogram December 12, 2018 1. Technically difficult study with limited views. Left ventricular ejection fraction, by visual estimation, is 35 to 40%. Hypokinesis of apex, anterior/septal/lateral apical walls, mid to basal inferolateral wall 2. Global right ventricle has normal systolic function.The right ventricular size is normal.  FINDINGS Left Ventricle: Left ventricular ejection fraction, by visual estimation, is 35 to 40%. The left ventricle has moderately decreased function. There is mildly increased left ventricular hypertrophy. Concentric left ventricular hypertrophy. Spectral  Doppler shows Left ventricular diastolic Doppler parameters are consistent with impaired relaxation pattern of LV diastolic filling.  Right Ventricle: The right ventricular size is normal. No increase in right ventricular wall thickness. Global RV systolic function is has normal systolic function.  Left Atrium: Left atrial size was normal in size.  Right Atrium: Right atrial size was not well visualized  Pericardium: The pericardium was not well visualized.  Mitral Valve: The mitral valve was not well visualized. Not assessed mitral valve regurgitation.  Tricuspid Valve: The tricuspid valve is not well  visualized. Tricuspid valve regurgitation was not assessed by color flow Doppler.  Aortic Valve: The aortic valve was not well visualized. Aortic valve regurgitation was not assessed by color flow Doppler.  Pulmonic Valve: The pulmonic valve was not well visualized. Pulmonic valve regurgitation was not assessed by color flow Doppler.  Aorta: The aortic root was not well visualized.  IAS/Shunts: The interatrial septum was not well visualized.   December 12, 2018-Left heart cath and coronary angiography   Acute lateral wall myocardial infarction due to thrombotic occlusion of the circumflex.  High-grade obstruction of the proximal RCA with collaterals to the apical LAD  Chronic total occlusion of the proximal LAD.  PTCA and stent implantation proximal to mid circumflex reducing 100% stenosis to less than 10% with TIMI grade III flow. Final post dual balloon diameter 2.83 mm using A 22 x 2.75 Onyx. No postdilatation was performed.  Mid anterior wall akinesis with anteroapical hypokinesis. LVEDP 25 mmHg.  Mean pulmonary capillary wedge pressure 7 mmHg, PA pressure 24 over 12 mmHg.  Intra-aortic balloon pump at 1:1 pumping.  Altered mental status prior to intubation. Etiology is unclear but likely related to CO2 narcosis. Rule out intracranial hemorrhage/bleed in the setting of intense anticoagulation.  Patient Profile     63 y.o.femalewith medical history significant for COPD, lung cancer status post pneumonectomy, tobacco use disorder, colon cancer with colostomy who presented to [facility], ED on December 12, 2018 with chest pain found to have STEMI found to  have high-grade obstruction of proximal RCA status post PTCA and stent implantation of proximal to mid circumflex. During her coronary intervention, she developed acute encephalopathy and hypercarbia with PCO2 of 81 which warranted emergent intubation. Due to hypoxia and elevated left ventricular end-diastolic pressure  of 28 mm per mercury, and intra-aortic balloon pump was inserted.  Assessment & Plan    1. Acute lateral STEMI s/p PTCA and stent implantation proximal to mid circumflex on 12/12/18. -Continue aspirin, Lipitor, Brilinta  2. Acute Systolic heart failure (EF 35-40%) - will hold bisoprolol due to hypotension. Also do not start ACEi/ARB due to hypotension. Will check CVP. If high will diurese further. Weight is stable. Co-ox is 78. Repeat Echo done this am- will review.   I personally reviewed Echo with definity contrast. There is inferior wall severe HK but overall EF is mildly reduced. I would estimate EF of 45%. Official reading/interpretation pending.   3. AcuteHypercarbic/hypoxicrespiratory failure - multifactorial due to COPD, ? SIR/PNA, CHF -now reintubated. On IV antibiotics, steroids, nebs.  - diuresis as needed to maintain negative balance. -Appreciate assistance from PCCM  4. ? right upper lobe pneumonia - temp up to 101. WBC improving. Back on antibiotics.  5. History of lung CA s/p partial right lung resection.  6. History of colon CA s/p colostomy.   The patient is critically ill with multiple organ systems failure and requires high complexity decision making for assessment and support, frequent evaluation and titration of therapies, application of advanced monitoring technologies and extensive interpretation of multiple databases.   For questions or updates, please contact [place] Please consult [email or web address] for contact info under Cardiology/STEMI.      Signed, Rushton Early [place], MD  12/16/2018, 8:25 AM

## 2018-12-16 NOTE — Progress Notes (Signed)
  Echocardiogram 2D Echocardiogram limited with definity has been performed.  Chelsea Allison M 12/16/2018, 7:47 AM

## 2018-12-16 NOTE — Plan of Care (Signed)
  Problem: Cardiac: Goal: Vascular access site(s) Level 0-1 will be maintained Outcome: Progressing   Problem: Education: Goal: Knowledge of General Education information will improve Description: Including pain rating scale, medication(s)/side effects and non-pharmacologic comfort measures Outcome: Progressing

## 2018-12-16 NOTE — Plan of Care (Signed)
  Problem: Safety: Goal: Ability to remain free from injury will improve Description: Remains in bilat upper extremity soft limb restraints d/t hands moving towards ETT.CVL and unable to safely follow instructions. Following protocol, no injuries.  Outcome: Progressing   Remains in bilat upper extremity soft limb restraints d/t pt reaching for ETT/OGT/CVL. Following restraint protocol. No complications.

## 2018-12-16 NOTE — Progress Notes (Signed)
NAME:  Chelsea Allison, MRN:  [id number], DOB:  23-Apr-1955, LOS: 4 ADMISSION DATE:  12/12/2018, CONSULTATION DATE: 12/12/2018 REFERRING MD: Linard Millers, MD, CHIEF COMPLAINT: ST elevation MI, respiratory failure  Brief History   63 year old with severe COPD, lung cancer status post resection, active smoker Admitted with STelevation MI. Decompensated in the Cath Lab with hypercarbia and intubated.  PCCM consulted for vent management. She was treated with IABP after cath. Improved and has been extubated. PCCM signed off 10/6, but called back later that day for acute resp distress.  Afternoon of 10/8 patient seen for respiratory distress with reapplication of BIPAP with good response initially. However by that evening patient was seen with severe acute respiratory failure despite BIPAP therapy. Bedside ECHO with depressed LVEF and Bline in lungs prompting decision for reintubation    Past Medical History  Severe COPD, prior partial right lung resection for lung cancer 2015, history of colon cancer with colostomy.   She has severe COPD, lung cancer on Breo inhaler.  Follows at [facility] pulmonary.  Current active smoker.  Lung cancer was treated with chemotherapy, right upper lobectomy in 2015.  PET scan at [facility] April 2020 shows no evidence of recurrence.  Significant Hospital Events   10/5 Admit with MI, status post cath, intra-aortic balloon pump 10/6 extubated, then resp distress later requiring BiPAP.  10/8 Re-intubated due to ARF and failure of BIPAP  Consults:  PCCM  Procedures:  Endotracheal tube 10/5 >> 10/6 Endotracheal tube 10/8 >>  Significant Diagnostic Tests:    Micro Data:  All negative  Antimicrobials:  Zosyn 10/5>>>10/8 Augmentin 10/9>>>10/11 Ceftriaxone 10/8 >>  Interim history/subjective:  No reported events overnight. Patient is lying in bed on full vent support but is able to be aroused with verbal stimuli and follow simple commands.   Objective   Blood  pressure 115/67, pulse 74, temperature 99.1 F (37.3 C), temperature source Oral, resp. rate (!) 24, height 5\' 8"  (1.727 m), weight 85.7 kg, SpO2 98 %.    Vent Mode: PRVC FiO2 (%):  [40 %-100 %] 40 % Set Rate:  [16 bmp-24 bmp] 24 bmp Vt Set:  [480 mL-510 mL] 500 mL PEEP:  [5 cmH20-10 cmH20] 10 cmH20 Plateau Pressure:  [20 cmH20-32 cmH20] 25 cmH20   Intake/Output Summary (Last 24 hours) at 12/16/2018 0816 Last data filed at 12/16/2018 0800 Gross per 24 hour  Intake 664.51 ml  Output 1680 ml  Net -1015.49 ml   Filed Weights   12/14/18 0600 12/15/18 0600 12/16/18 0500  Weight: 86.1 kg 85.4 kg 85.7 kg   Examination: General: Chronically ill appearing elderly female on mechanical ventilation, in NAD HEENT: ETT, MM pink/moist, PERRL, CVC to left neck  Neuro: Slightly sedated on vent but is able to be easily aroused with verbal stimuli and able to follow simple commands CV: s1s2 regular rate and rhythm, no murmur, rubs, or gallops,  PULM: Full vent support, Expiratory wheeze R > L, no increased work of breathing  GI: soft, bowel sounds active in all 4 quadrants, non-tender, non-distended OG LIWS  Extremities: warm/dry, no edema  Skin: no rashes or lesions  Resolved Hospital Problem list   Hypercarbic, hypoxic respiratory failure in the setting of severe COPD, MI  Assessment & Plan:   Acute hypoxic and hypercapnic respiratory failure   History of COPD  -Respiratory distress reoccurred 10/6 after extubation requiring BiPAP. CXR with RUL opacification, but course felt to be more consistent with pulmonary edema.  - Again seen n  respiratory distress afternoon of 10/8, later that day failed BIPAP resulting in reintubation.  P: PRVC 8cc/kg PSV trial today  Wean FiO2 and PEEP for sats 88-92% Continue Bronchodilators  Continue IV antibiotics  IV steroids  Follow cultures  Consider further diuretics if CVP elevated  VAP bundle in place  Follow intermittent CXR and ABG  Sepsis  Rule  out HCAP -Seen with T-max of 101.4 overnight, Tachypnea, elevated WBC, lactic acidosis, and radiologic appears of questionable signs of infection  P: Continue IV antibiotics Follow Cultures No indications to broaden antibiotic coverage at this time  Levophed for MAP > 65 Monitor volume status closely   History of lung cancer status post resection and radiation, active smoker.  Most recent PET 06/27/2018 with no recurrence  P: Supportive care   STEMI, cardiogenic shock Acute Systolic Heart Failure (EF 35-40%) --2D echo repeated 10/9 preliminary read consistent with severe inferior wall hypokinesis with estimated ED of 45%, full read/interpritation pending  P: Cardiology following Obtain CVP if elevated will continue diuretics Hold Bisoprolol ad ACEi/ARB due to hypotension  Continue Brilinta   Hypokalemia  P: Supplementation provided  Trend Bmet   At risk malnutrition  P: Trickle tube feed though OG Dietary consulted   Best practice:  Diet: Begin trickle tube feeds  Pain/Anxiety/Delirium protocol (if indicated): PRN fentanyl and versed  VAP protocol (if indicated): In place  DVT prophylaxis: Asprin and Brilinta  GI prophylaxis: Protonix  Glucose control: Monitor sugars Mobility: Bedrest Code Status: Full Family Communication: Will update today  Disposition: ICU  Labs   CBC: Recent Labs  Lab 12/12/18 0657  12/13/18 0429  12/14/18 0229 12/15/18 0209 12/15/18 1945 12/15/18 1951 12/15/18 2117 12/16/18 0505  WBC 12.8*   < > 14.8*  --  20.5* 15.1*  --  23.9*  --  13.1*  NEUTROABS 10.7*  --   --   --   --   --   --   --   --   --   HGB 16.8*   < > 14.2   < > 15.2* 12.8 15.6* 15.1* 15.0 13.5  HCT 53.9*   < > 45.1   < > 49.8* 41.9 46.0 49.1* 44.0 43.8  MCV 90.4   < > 87.2  --  89.2 89.5  --  92.1  --  90.3  PLT 327   < > 344  --  299 258  --  320  --  246   < > = values in this interval not displayed.    Basic Metabolic Panel: Recent Labs  Lab 12/13/18 0429   12/14/18 0229 12/15/18 0729 12/15/18 1945 12/15/18 1951 12/15/18 2117 12/16/18 0505  NA 141   < > 140 141 141 142 143 141  K 3.1*   < > 3.8 3.5 3.5 3.9 3.3* 3.4*  CL 107  --  106 102  --  100  --  101  CO2 22  --  24 26  --  27  --  26  GLUCOSE 162*  --  140* 118*  --  285*  --  184*  BUN 26*  --  29* 42*  --  37*  --  39*  CREATININE 1.32*  --  1.08* 1.12*  --  1.25*  --  1.13*  CALCIUM 8.7*  --  8.9 9.5  --  9.1  --  9.0  MG  --   --  2.1  --   --  2.1  --   --    < > =  values in this interval not displayed.   GFR: Estimated Creatinine Clearance: 59.2 mL/min (A) (by C-G formula based on SCr of 1.13 mg/dL (H)). Recent Labs  Lab 12/14/18 0229 12/14/18 1049 12/15/18 0209 12/15/18 1951 12/15/18 2224 12/16/18 0013 12/16/18 0505  PROCALCITON  --  0.48  --   --   --   --   --   WBC 20.5*  --  15.1* 23.9*  --   --  13.1*  LATICACIDVEN  --   --   --  2.2* 2.0* 1.8  --     Liver Function Tests: Recent Labs  Lab 12/13/18 0429  AST 219*  ALT 69*  ALKPHOS 87  BILITOT 0.2*  PROT 6.2*  ALBUMIN 3.0*   No results for input(s): LIPASE, AMYLASE in the last 168 hours. No results for input(s): AMMONIA in the last 168 hours.  ABG    Component Value Date/Time   PHART 7.399 12/15/2018 2117   PCO2ART 45.3 12/15/2018 2117   PO2ART 72.0 (L) 12/15/2018 2117   HCO3 28.1 (H) 12/15/2018 2117   TCO2 29 12/15/2018 2117   ACIDBASEDEF 1.0 12/13/2018 2237   O2SAT 78.2 12/16/2018 0758     Coagulation Profile: Recent Labs  Lab 12/12/18 0657  INR 1.0    Cardiac Enzymes: No results for input(s): CKTOTAL, CKMB, CKMBINDEX, TROPONINI in the last 168 hours.  HbA1C: Hgb A1c MFr Bld  Date/Time Value Ref Range Status  12/12/2018 11:32 AM 5.4 4.8 - 5.6 % Final    Comment:    (NOTE) Pre diabetes:          5.7%-6.4% Diabetes:              >6.4% Glycemic control for   <7.0% adults with diabetes     CBG: Recent Labs  Lab 12/12/18 0753 12/15/18 1946  GLUCAP 119* 278*     CRITICAL CARE Performed by: Johnsie Cancel   Total critical care time: 40 minutes  Critical care time was exclusive of separately billable procedures and treating other patients.  Critical care was necessary to treat or prevent imminent or life-threatening deterioration.  Critical care was time spent personally by me on the following activities: development of treatment plan with patient and/or surrogate as well as nursing, discussions with consultants, evaluation of patient's response to treatment, examination of patient, obtaining history from patient or surrogate, ordering and performing treatments and interventions, ordering and review of laboratory studies, ordering and review of radiographic studies, pulse oximetry and re-evaluation of patient's condition.  Johnsie Cancel, NP-C [facility] Pgr: [phone] or if no answer [phone] 12/16/2018, 9:09 AM

## 2018-12-17 ENCOUNTER — Inpatient Hospital Stay (HOSPITAL_COMMUNITY): Payer: Medicare HMO

## 2018-12-17 DIAGNOSIS — J189 Pneumonia, unspecified organism: Secondary | ICD-10-CM

## 2018-12-17 DIAGNOSIS — I2129 ST elevation (STEMI) myocardial infarction involving other sites: Principal | ICD-10-CM

## 2018-12-17 DIAGNOSIS — Y95 Nosocomial condition: Secondary | ICD-10-CM

## 2018-12-17 DIAGNOSIS — I472 Ventricular tachycardia: Secondary | ICD-10-CM

## 2018-12-17 DIAGNOSIS — Z9911 Dependence on respirator [ventilator] status: Secondary | ICD-10-CM

## 2018-12-17 LAB — BASIC METABOLIC PANEL
Anion gap: 11 (ref 5–15)
BUN: 39 mg/dL — ABNORMAL HIGH (ref 8–23)
CO2: 26 mmol/L (ref 22–32)
Calcium: 9.4 mg/dL (ref 8.9–10.3)
Chloride: 107 mmol/L (ref 98–111)
Creatinine, Ser: 0.95 mg/dL (ref 0.44–1.00)
GFR calc Af Amer: >60 mL/min (ref >60)
GFR calc non Af Amer: >60 mL/min (ref >60)
Glucose, Bld: 149 mg/dL — ABNORMAL HIGH (ref 70–99)
Potassium: 3.8 mmol/L (ref 3.5–5.1)
Sodium: 144 mmol/L (ref 135–145)

## 2018-12-17 LAB — CBC
HCT: 42.3 % (ref 36.0–46.0)
Hemoglobin: 12.8 g/dL (ref 12.0–15.0)
MCH: 27.5 pg (ref 26.0–34.0)
MCHC: 30.3 g/dL (ref 30.0–36.0)
MCV: 91 fL (ref 80.0–100.0)
Platelets: 224 10*3/uL (ref 150–400)
RBC: 4.65 MIL/uL (ref 3.87–5.11)
RDW: 14.4 % (ref 11.5–15.5)
WBC: 10.4 10*3/uL (ref 4.0–10.5)
nRBC: 0 % (ref 0.0–0.2)

## 2018-12-17 LAB — MAGNESIUM: Magnesium: 2.6 mg/dL — ABNORMAL HIGH (ref 1.7–2.4)

## 2018-12-17 LAB — GLUCOSE, CAPILLARY
Glucose-Capillary: 145 mg/dL — ABNORMAL HIGH (ref 70–99)
Glucose-Capillary: 123 mg/dL — ABNORMAL HIGH (ref 70–99)
Glucose-Capillary: 137 mg/dL — ABNORMAL HIGH (ref 70–99)
Glucose-Capillary: 127 mg/dL — ABNORMAL HIGH (ref 70–99)
Glucose-Capillary: 125 mg/dL — ABNORMAL HIGH (ref 70–99)
Glucose-Capillary: 150 mg/dL — ABNORMAL HIGH (ref 70–99)
Glucose-Capillary: 175 mg/dL — ABNORMAL HIGH (ref 70–99)
Glucose-Capillary: 127 mg/dL — ABNORMAL HIGH (ref 70–99)

## 2018-12-17 LAB — TRIGLYCERIDES: Triglycerides: 256 mg/dL — ABNORMAL HIGH (ref <150)

## 2018-12-17 MED ORDER — METOPROLOL TARTRATE 5 MG/5ML IV SOLN
5.0000 mg | Freq: Four times a day (QID) | INTRAVENOUS | Status: DC
  Administered 2018-12-17 – 2018-12-18 (×4): 5 mg via INTRAVENOUS
  Filled 2018-12-17 (×4): qty 5

## 2018-12-17 MED ORDER — IPRATROPIUM-ALBUTEROL 0.5-2.5 (3) MG/3ML IN SOLN
3.0000 mL | Freq: Three times a day (TID) | RESPIRATORY_TRACT | Status: DC
  Administered 2018-12-18: 3 mL via RESPIRATORY_TRACT

## 2018-12-17 MED ORDER — METHYLPREDNISOLONE SODIUM SUCC 40 MG IJ SOLR
40.0000 mg | INTRAMUSCULAR | Status: DC
  Administered 2018-12-18: 40 mg via INTRAVENOUS
  Filled 2018-12-17: qty 1

## 2018-12-17 MED ORDER — POTASSIUM CHLORIDE 10 MEQ/50ML IV SOLN
10.0000 meq | INTRAVENOUS | Status: AC
  Administered 2018-12-17 (×2): 10 meq via INTRAVENOUS
  Filled 2018-12-17 (×2): qty 50

## 2018-12-17 MED ORDER — TRAMADOL HCL 50 MG PO TABS
50.0000 mg | ORAL_TABLET | Freq: Three times a day (TID) | ORAL | Status: DC | PRN
  Administered 2018-12-17 – 2018-12-20 (×9): 50 mg via ORAL
  Filled 2018-12-17 (×10): qty 1

## 2018-12-17 NOTE — Procedures (Signed)
Extubation Procedure Note  Patient Details:   Name: Chelsea Allison DOB: 1955/04/03 MRN: [id number]   Airway Documentation:    Vent end date: 12/17/18 Vent end time: 0840   Evaluation  O2 sats: stable throughout Complications: No apparent complications Patient did tolerate procedure well. Bilateral Breath Sounds: Clear, Diminished   Yes   Positive cuff leak noted. NIF - 36, VC 970 mL.  Patient placed on Bipap post extubation per MD order. No stridor noted. Patient tolerating well at this time.  Mingo Amber Slevin Gunby 12/17/2018, 9:14 AM

## 2018-12-17 NOTE — Progress Notes (Signed)
NAME:  Chelsea Allison, MRN:  [id number], DOB:  11/19/1955, LOS: 5 ADMISSION DATE:  12/12/2018, CONSULTATION DATE: 12/12/2018 REFERRING MD: Linard Millers, MD, CHIEF COMPLAINT: ST elevation MI, respiratory failure  Brief History   63 year old with severe COPD, lung cancer status post resection, active smoker Admitted with STelevation MI. Decompensated in the Cath Lab with hypercarbia and intubated.  PCCM consulted for vent management. She was treated with IABP after cath. Improved and has been extubated. PCCM signed off 10/6, but called back later that day for acute resp distress.  Afternoon of 10/8 patient seen for respiratory distress with reapplication of BIPAP with good response initially. However by that evening patient was seen with severe acute respiratory failure despite BIPAP therapy. Bedside ECHO with depressed LVEF and Bline in lungs prompting decision for reintubation    Past Medical History  Severe COPD, prior partial right lung resection for lung cancer 2015, history of colon cancer with colostomy.   She has severe COPD, lung cancer on Breo inhaler.  Follows at [facility] pulmonary.  Current active smoker.  Lung cancer was treated with chemotherapy, right upper lobectomy in 2015.  PET scan at [facility] April 2020 shows no evidence of recurrence.  Significant Hospital Events   10/5 Admit with MI, status post cath, intra-aortic balloon pump. 10/6 extubated, then resp distress later requiring BiPAP.  10/8 Re-intubated due to ARF and failure of BIPAP  Consults:  PCCM  Procedures:  Endotracheal tube 10/5 >> 10/6 Endotracheal tube 10/8 >> CVC L subclavian  Significant Diagnostic Tests:  CXR 12/17/2018- resolving right upper lobe infiltrate.  Chronic right hemidiaphragm tenting.   Micro Data:  12/15/2018 respiratory >> 12/14/2018 respiratory few GPC, rare GNR, PMNs>> normal flora 12/13/2018 blood>>   Antimicrobials:  Zosyn 10/5>>>10/8 Augmentin 10/9>>>10/11 Ceftriaxone 10/8 >>  Interim history/subjective:  No acute events overnight.  Off propofol this morning and following commands.  She complains of lower back pain which is new.   Objective   Blood pressure (!) 146/93, pulse 85, temperature 98.4 F (36.9 C), temperature source Oral, resp. rate (!) 32, height 5\' 8"  (1.727 m), weight 87.1 kg, SpO2 96 %. CVP:  [8 mmHg-12 mmHg] 12 mmHg  Vent Mode: PSV FiO2 (%):  [30 %-40 %] 30 % Set Rate:  [20 bmp] 20 bmp Vt Set:  [500 mL] 500 mL PEEP:  [5 cmH20] 5 cmH20 Pressure Support:  [8 cmH20] 8 cmH20 Plateau Pressure:  [12 cmH20-20 cmH20] 13 cmH20   Intake/Output Summary (Last 24 hours) at 12/17/2018 4332 Last data filed at 12/17/2018 0700 Gross per 24 hour  Intake 503.91 ml  Output 1900 ml  Net -1396.09 ml   Filed Weights   12/15/18 0600 12/16/18 0500 12/17/18 0500  Weight: 85.4 kg 85.7 kg 87.1 kg   Examination: General: Elderly woman laying in bed no acute distress HEENT: [place]/AT, eyes anicteric Neuro: Awake, alert, attempting to communicate nonverbally.  Moving all extremities spontaneously.  Comprehension appears intact. CV: S1S2, RRR, no murmurs. PULM: Clear to auscultation bilaterally, breathing above vent.  Tachypneic with respiratory rate around 30-35 on CPAP 5 +PS 8.   GI: Soft, nontender, nondistended Extremities: No cyanosis, no edema Skin: No rashes or wounds.  Left subclavian CVC without erythema or drainage.  Resolved Hospital Problem list   Hypercarbic, hypoxic respiratory failure in the setting of severe COPD, MI  Assessment & Plan:   Acute hypoxic and hypercapnic respiratory failure   History of COPD Right upper lobe pneumonia- cultures with normal flora, slowly resolving  on chest x-ray.  Fevers resolving on antibiotics. -Respiratory distress reoccurred 10/6 after extubation requiring BiPAP. CXR with RUL opacification, but course felt to be more consistent with pulmonary edema.  - Again seen n respiratory distress afternoon of 10/8, later  that day failed BIPAP resulting in reintubation.  P: - Continue low tidal volume ventilation 8 cc/kg ideal body weight, goal plateau less than 30 and goal driving pressure less than 15. - Daily SAT and SBT- will obtain weaning parameters today to determine if she will will tolerate extubation.  Passed weaning parameters, positive cuff leak.  Likely extubation today. -We will extubate to BiPAP given history of COPD and heart failure -Continue ceftriaxone for 7 days (stop date 10/14) - We will de-escalate steroids to once daily. -Continue scheduled bronchodilators. -VAP prevention protocol -PPI for GI prophylaxis  Sepsis due to HAP -Seen with T-max of 101.4 overnight, Tachypnea, elevated WBC, lactic acidosis, and radiologic appears of questionable signs of infection  P: -Continue ceftriaxone to complete a 7-day course of antibiotics -Continue to follow cultures are finalized  History of lung cancer status post resection and radiation, active smoker.  Most recent PET 06/27/2018 with no recurrence  P: -Continue outpatient care as planned -We will counsel on the importance of smoking cessation when appropriate  STEMI, previously in cardiogenic shock Acute Systolic Heart Failure (EF 40-45%) P: -Appreciate cardiology's management -Appears euvolemic; will defer additional diuresis to cardiology - If she remains hemodynamically stable she may tolerate reinitiation of guideline directed heart failure therapy; will defer to cardiology. -Continue dual antiplatelet therapy and statin  Hypokalemia, stable. Normal Mg+. P: -94mEq KCl this morning -Continue to monitor  At risk for malnutrition  P: -Tube feeds held for extubation  Best practice:  Diet:  N.p.o. Pain/Anxiety/Delirium protocol (if indicated): PRN fentanyl and versed  VAP protocol (if indicated): In place  DVT prophylaxis:  [place] heparin GI prophylaxis: Protonix  Glucose control: Monitor sugars Mobility: Bedrest Code Status:  Full Family Communication: No family contact information in the chart Disposition: ICU  Labs   CBC: Recent Labs  Lab 12/12/18 0657  12/14/18 0229 12/15/18 0209 12/15/18 1945 12/15/18 1951 12/15/18 2117 12/16/18 0505 12/17/18 0346  WBC 12.8*   < > 20.5* 15.1*  --  23.9*  --  13.1* 10.4  NEUTROABS 10.7*  --   --   --   --   --   --   --   --   HGB 16.8*   < > 15.2* 12.8 15.6* 15.1* 15.0 13.5 12.8  HCT 53.9*   < > 49.8* 41.9 46.0 49.1* 44.0 43.8 42.3  MCV 90.4   < > 89.2 89.5  --  92.1  --  90.3 91.0  PLT 327   < > 299 258  --  320  --  246 224   < > = values in this interval not displayed.    Basic Metabolic Panel: Recent Labs  Lab 12/14/18 0229 12/15/18 0729 12/15/18 1945 12/15/18 1951 12/15/18 2117 12/16/18 0505 12/17/18 0346  NA 140 141 141 142 143 141 144  K 3.8 3.5 3.5 3.9 3.3* 3.4* 3.8  CL 106 102  --  100  --  101 107  CO2 24 26  --  27  --  26 26  GLUCOSE 140* 118*  --  285*  --  184* 149*  BUN 29* 42*  --  37*  --  39* 39*  CREATININE 1.08* 1.12*  --  1.25*  --  1.13*  0.95  CALCIUM 8.9 9.5  --  9.1  --  9.0 9.4  MG 2.1  --   --  2.1  --   --  2.6*   GFR: Estimated Creatinine Clearance: 71 mL/min (by C-G formula based on SCr of 0.95 mg/dL). Recent Labs  Lab 12/14/18 1049 12/15/18 0209 12/15/18 1951 12/15/18 2224 12/16/18 0013 12/16/18 0505 12/17/18 0346  PROCALCITON 0.48  --   --   --   --   --   --   WBC  --  15.1* 23.9*  --   --  13.1* 10.4  LATICACIDVEN  --   --  2.2* 2.0* 1.8  --   --     Liver Function Tests: Recent Labs  Lab 12/13/18 0429  AST 219*  ALT 69*  ALKPHOS 87  BILITOT 0.2*  PROT 6.2*  ALBUMIN 3.0*   No results for input(s): LIPASE, AMYLASE in the last 168 hours. No results for input(s): AMMONIA in the last 168 hours.  ABG    Component Value Date/Time   PHART 7.399 12/15/2018 2117   PCO2ART 45.3 12/15/2018 2117   PO2ART 72.0 (L) 12/15/2018 2117   HCO3 28.1 (H) 12/15/2018 2117   TCO2 29 12/15/2018 2117    ACIDBASEDEF 1.0 12/13/2018 2237   O2SAT 78.2 12/16/2018 0758     Coagulation Profile: Recent Labs  Lab 12/12/18 0657  INR 1.0    Cardiac Enzymes: No results for input(s): CKTOTAL, CKMB, CKMBINDEX, TROPONINI in the last 168 hours.  HbA1C: Hgb A1c MFr Bld  Date/Time Value Ref Range Status  12/12/2018 11:32 AM 5.4 4.8 - 5.6 % Final    Comment:    (NOTE) Pre diabetes:          5.7%-6.4% Diabetes:              >6.4% Glycemic control for   <7.0% adults with diabetes     CBG: Recent Labs  Lab 12/16/18 1556 12/16/18 1956 12/17/18 0030 12/17/18 0402 12/17/18 0803  GLUCAP 125* 123* 137* 145* 127*    This patient is critically ill with multiple organ system failure which requires frequent high complexity decision making, assessment, support, evaluation, and titration of therapies. This was completed through the application of advanced monitoring technologies and extensive interpretation of multiple databases. During this encounter critical care time was devoted to patient care services described in this note for 45 minutes.  Julian Hy, DO 12/17/18 8:39 AM [facility]

## 2018-12-17 NOTE — Progress Notes (Addendum)
Patient taken off Bipap and placed on [place] 3 L with humidity. Patient tolerating well at this time.  RT instructed patient on Incentive spirometer, Patient able to reach 750 mL.

## 2018-12-17 NOTE — Progress Notes (Addendum)
Progress Note  Patient Name: Chelsea Allison Date of Encounter: 12/17/2018  Primary Cardiologist: Sinclair Grooms, MD   Subjective   Patient just got extubated. Denies chest pain.   Inpatient Medications    Scheduled Meds:  aspirin  81 mg Per Tube Daily   chlorhexidine gluconate (MEDLINE KIT)  15 mL Mouth Rinse BID   Chlorhexidine Gluconate Cloth  6 each Topical Daily   feeding supplement (VITAL AF 1.2 CAL)  1,000 mL Per Tube Q24H   heparin  5,000 Units Subcutaneous Q8H   ipratropium-albuterol  3 mL Nebulization Q4H   mouth rinse  15 mL Mouth Rinse 10 times per day   [START ON 12/18/2018] methylPREDNISolone (SOLU-MEDROL) injection  40 mg Intravenous Q24H   pantoprazole (PROTONIX) IV  40 mg Intravenous QHS   rosuvastatin  10 mg Per Tube q1800   sodium chloride flush  3 mL Intravenous Q12H   ticagrelor  90 mg Oral BID   Continuous Infusions:  sodium chloride 10 mL/hr at 12/15/18 2010   cefTRIAXone (ROCEPHIN)  IV 2 g (12/16/18 1856)   potassium chloride 10 mEq (12/17/18 0801)   propofol (DIPRIVAN) infusion Stopped (12/17/18 0702)   PRN Meds: acetaminophen, albuterol, bisacodyl, docusate, fentaNYL (SUBLIMAZE) injection, midazolam, nitroGLYCERIN, ondansetron (ZOFRAN) IV, sennosides, sodium chloride flush, sodium chloride flush   Vital Signs    Vitals:   12/17/18 0700 12/17/18 0742 12/17/18 0800 12/17/18 0808  BP: (!) 144/86 (!) 146/93 (!) 150/89   Pulse: 82 85 94   Resp: 18 (!) 32 (!) 22   Temp:    98.4 F (36.9 C)  TempSrc:    Oral  SpO2: 100% 96% 97%   Weight:      Height:        Intake/Output Summary (Last 24 hours) at 12/17/2018 0836 Last data filed at 12/17/2018 0800 Gross per 24 hour  Intake 504.11 ml  Output 1900 ml  Net -1395.89 ml   Filed Weights   12/15/18 0600 12/16/18 0500 12/17/18 0500  Weight: 85.4 kg 85.7 kg 87.1 kg    Telemetry    NSR- Personally Reviewed  ECG    None today- Personally Reviewed  Physical Exam    GEN: No acute distress.  Awake, CPAP on.  Cardiac: RRR, no murmurs, rubs, or gallops.  Respiratory:  clear MS: No edema; No deformity. Neuro:  Nonfocal  Psych: Normal affect   Labs    Chemistry Recent Labs  Lab 12/13/18 0429  12/15/18 1951 12/15/18 2117 12/16/18 0505 12/17/18 0346  NA 141   < > 142 143 141 144  K 3.1*   < > 3.9 3.3* 3.4* 3.8  CL 107   < > 100  --  101 107  CO2 22   < > 27  --  26 26  GLUCOSE 162*   < > 285*  --  184* 149*  BUN 26*   < > 37*  --  39* 39*  CREATININE 1.32*   < > 1.25*  --  1.13* 0.95  CALCIUM 8.7*   < > 9.1  --  9.0 9.4  PROT 6.2*  --   --   --   --   --   ALBUMIN 3.0*  --   --   --   --   --   AST 219*  --   --   --   --   --   ALT 69*  --   --   --   --   --  ALKPHOS 87  --   --   --   --   --   BILITOT 0.2*  --   --   --   --   --   GFRNONAA 43*   < > 46*  --  52* >60  GFRAA 50*   < > 53*  --  >60 >60  ANIONGAP 12   < > 15  --  14 11   < > = values in this interval not displayed.     Hematology Recent Labs  Lab 12/15/18 1951 12/15/18 2117 12/16/18 0505 12/17/18 0346  WBC 23.9*  --  13.1* 10.4  RBC 5.33*  --  4.85 4.65  HGB 15.1* 15.0 13.5 12.8  HCT 49.1* 44.0 43.8 42.3  MCV 92.1  --  90.3 91.0  MCH 28.3  --  27.8 27.5  MCHC 30.8  --  30.8 30.3  RDW 14.4  --  14.3 14.4  PLT 320  --  246 224    Cardiac EnzymesNo results for input(s): TROPONINI in the last 168 hours. No results for input(s): TROPIPOC in the last 168 hours.   BNP Recent Labs  Lab 12/12/18 1132  BNP 248.0*     DDimer No results for input(s): DDIMER in the last 168 hours.   Radiology    Dg Chest Port 1 View  Result Date: 12/15/2018 CLINICAL DATA:  Central line placement. EXAM: PORTABLE CHEST 1 VIEW COMPARISON:  Earlier same day as well as 12/13/2018 and 12/12/2018 FINDINGS: Endotracheal tube has tip 4.6 cm from the carina. Interval placement of left IJ central venous catheter with tip over the SVC. Lungs are adequately inflated with stable chronic  volume loss of the right lung. No pneumothorax. Persistent airspace opacification over the right mid to upper lung likely infection. Postsurgical/post radiation changes over the right paramediastinal region. Remainder of the exam is unchanged. IMPRESSION: Stable hazy airspace process over the right mid to upper lung likely infection. Chronic postsurgical/post radiation change and volume loss of the right lung. Tubes and lines as described.  No pneumothorax. Electronically Signed   By: Marin Olp M.D.   On: 12/15/2018 19:43   Portable Chest X-ray  Result Date: 12/15/2018 CLINICAL DATA:  Endotracheal tube placement. EXAM: PORTABLE CHEST 1 VIEW COMPARISON:  12/15/2018 earlier. FINDINGS: Interval placement of endotracheal tube with tip 4.6 cm above the carina. Persistent hazy airspace opacification over the right mid to upper lung likely infection. Postsurgical/post radiation change over the right paramediastinal region with stable volume loss of the right lung. No effusion. Cardiomediastinal silhouette and remainder of the exam is unchanged. IMPRESSION: Persistent airspace process over the right mid to upper lung likely infection. Endotracheal tube with tip 4.6 cm above the carina. Electronically Signed   By: Marin Olp M.D.   On: 12/15/2018 18:59   Dg Chest Port 1 View  Result Date: 12/15/2018 CLINICAL DATA:  Shortness of breath. Known history of right lung cancer. EXAM: PORTABLE CHEST 1 VIEW COMPARISON:  12/13/2018 FINDINGS: Lungs are adequately inflated and demonstrate slight worsening airspace opacification over the right mid to upper lung likely infection. No evidence of effusion. Postsurgical/radiation change over the right paramediastinal region. Cardiomediastinal silhouette and remainder the exam is unchanged. IMPRESSION: Worsening airspace process over the right mid to upper lung likely infection. Electronically Signed   By: Marin Olp M.D.   On: 12/15/2018 18:57   Dg Abd Portable  1v  Result Date: 12/15/2018 CLINICAL DATA:  Enteric tube placement. EXAM: PORTABLE ABDOMEN -  1 VIEW COMPARISON:  Chest x-ray from same day. FINDINGS: Enteric tube in the mid stomach. Visualized bowel gas pattern is normal. IMPRESSION: Enteric tube in the stomach. Electronically Signed   By: Titus Dubin M.D.   On: 12/15/2018 21:58    Cardiac Studies   Echocardiogram December 12, 2018 1. Technically difficult study with limited views. Left ventricular ejection fraction, by visual estimation, is 35 to 40%. Hypokinesis of apex, anterior/septal/lateral apical walls, mid to basal inferolateral wall 2. Global right ventricle has normal systolic function.The right ventricular size is normal.  FINDINGS Left Ventricle: Left ventricular ejection fraction, by visual estimation, is 35 to 40%. The left ventricle has moderately decreased function. There is mildly increased left ventricular hypertrophy. Concentric left ventricular hypertrophy. Spectral  Doppler shows Left ventricular diastolic Doppler parameters are consistent with impaired relaxation pattern of LV diastolic filling.  Right Ventricle: The right ventricular size is normal. No increase in right ventricular wall thickness. Global RV systolic function is has normal systolic function.  Left Atrium: Left atrial size was normal in size.  Right Atrium: Right atrial size was not well visualized  Pericardium: The pericardium was not well visualized.  Mitral Valve: The mitral valve was not well visualized. Not assessed mitral valve regurgitation.  Tricuspid Valve: The tricuspid valve is not well visualized. Tricuspid valve regurgitation was not assessed by color flow Doppler.  Aortic Valve: The aortic valve was not well visualized. Aortic valve regurgitation was not assessed by color flow Doppler.  Pulmonic Valve: The pulmonic valve was not well visualized. Pulmonic valve regurgitation was not assessed by color flow  Doppler.  Aorta: The aortic root was not well visualized.  IAS/Shunts: The interatrial septum was not well visualized.   December 12, 2018-Left heart cath and coronary angiography   Acute lateral wall myocardial infarction due to thrombotic occlusion of the circumflex.  High-grade obstruction of the proximal RCA with collaterals to the apical LAD  Chronic total occlusion of the proximal LAD.  PTCA and stent implantation proximal to mid circumflex reducing 100% stenosis to less than 10% with TIMI grade III flow. Final post dual balloon diameter 2.83 mm using A 22 x 2.75 On[place]. No postdilatation was performed.  Mid anterior wall akinesis with anteroapical hypokinesis. LVEDP 25 mmHg.  Mean pulmonary capillary wedge pressure 7 mmHg, PA pressure 24 over 12 mmHg.  Intra-aortic balloon pump at 1:1 pumping.  Altered mental status prior to intubation. Etiology is unclear but likely related to CO2 narcosis. Rule out intracranial hemorrhage/bleed in the setting of intense anticoagulation.  Patient Profile     63 y.o.femalewith medical history significant for COPD, lung cancer status post pneumonectomy, tobacco use disorder, colon cancer with colostomy who presented to [facility], ED on December 12, 2018 with chest pain found to have STEMI found to have high-grade obstruction of proximal RCA status post PTCA and stent implantation of proximal to mid circumflex. During her coronary intervention, she developed acute encephalopathy and hypercarbia with PCO2 of 81 which warranted emergent intubation. Due to hypoxia and elevated left ventricular end-diastolic pressure of 28 mm per mercury, and intra-aortic balloon pump was inserted.  Assessment & Plan    1. Acute lateral STEMI s/p PTCA and stent implantation proximal to mid circumflex on 12/12/18. -Continue aspirin, Lipitor, Brilinta, Hb 12.8  2. Acute Systolic heart failure (EF 35-40%) - will hold bisoprolol as she is still NPO, I will  start metoprolol 5 mg iv Q6H and switch to PO bisoprolol tomorrow.  I will plan for  ACEi/ARB tomorrow if able to swallow. BP is mildly elevated CVP 12 this am, no signs of significant fluid overload and febrile yesterday, I will hold off diuretics.  Echo showed inferior wall severe HK but overall EF is mildly reduced. I would estimate EF of 45%.  2 runs of ns VT - the longest lasting 14 beats in the last 24 hours  3. AcuteHypercarbic/hypoxicrespiratory failure - multifactorial due to COPD, ? SIR/PNA, CHF -now extubated, started on CPAP  4. ? right upper lobe pneumonia - temp up to 101, none since yesterday. WBC improving. Back on antibiotics - Ceftriaxone for 7 days.  5. History of lung CA s/p partial right lung resection.  6. History of colon CA s/p colostomy.   The patient is critically ill with multiple organ systems failure and requires high complexity decision making for assessment and support, frequent evaluation and titration of therapies, application of advanced monitoring technologies and extensive interpretation of multiple databases.  For questions or updates, please contact [place] Please consult [email or web address] for contact info under Cardiology/STEMI.     Signed, Ena Dawley, MD  12/17/2018, 8:36 AM

## 2018-12-18 LAB — GLUCOSE, CAPILLARY
Glucose-Capillary: 111 mg/dL — ABNORMAL HIGH (ref 70–99)
Glucose-Capillary: 119 mg/dL — ABNORMAL HIGH (ref 70–99)
Glucose-Capillary: 121 mg/dL — ABNORMAL HIGH (ref 70–99)
Glucose-Capillary: 135 mg/dL — ABNORMAL HIGH (ref 70–99)
Glucose-Capillary: 151 mg/dL — ABNORMAL HIGH (ref 70–99)
Glucose-Capillary: 98 mg/dL (ref 70–99)

## 2018-12-18 LAB — CULTURE, BLOOD (ROUTINE X 2)
Culture: NO GROWTH
Culture: NO GROWTH
Special Requests: ADEQUATE
Special Requests: ADEQUATE

## 2018-12-18 LAB — CULTURE, RESPIRATORY W GRAM STAIN
Culture: NORMAL
Gram Stain: NONE SEEN

## 2018-12-18 MED ORDER — SENNA 8.6 MG PO TABS: 1.0000 | ORAL_TABLET | Freq: Two times a day (BID) | ORAL | Status: DC | PRN

## 2018-12-18 MED ORDER — METOPROLOL TARTRATE 12.5 MG HALF TABLET
12.5000 mg | ORAL_TABLET | Freq: Two times a day (BID) | ORAL | Status: DC
  Administered 2018-12-18 – 2018-12-19 (×4): 12.5 mg via ORAL
  Filled 2018-12-18 (×5): qty 1

## 2018-12-18 MED ORDER — FLUTICASONE FUROATE-VILANTEROL 100-25 MCG/INH IN AEPB
1.0000 | INHALATION_SPRAY | Freq: Every day | RESPIRATORY_TRACT | Status: DC
  Administered 2018-12-18 – 2018-12-19 (×2): 1 via RESPIRATORY_TRACT
  Filled 2018-12-18: qty 28

## 2018-12-18 MED ORDER — PANTOPRAZOLE SODIUM 40 MG PO TBEC
40.0000 mg | DELAYED_RELEASE_TABLET | Freq: Every day | ORAL | Status: DC
  Administered 2018-12-18 – 2018-12-20 (×3): 40 mg via ORAL
  Filled 2018-12-18 (×4): qty 1

## 2018-12-18 MED ORDER — ASPIRIN 81 MG PO CHEW
81.0000 mg | CHEWABLE_TABLET | Freq: Every day | ORAL | Status: DC
  Administered 2018-12-19 – 2018-12-20 (×2): 81 mg via ORAL
  Filled 2018-12-18 (×2): qty 1

## 2018-12-18 MED ORDER — ROSUVASTATIN CALCIUM 5 MG PO TABS
10.0000 mg | ORAL_TABLET | Freq: Every day | ORAL | Status: DC
  Filled 2018-12-18 (×3): qty 2

## 2018-12-18 MED ORDER — ACETAMINOPHEN 325 MG PO TABS
650.0000 mg | ORAL_TABLET | Freq: Four times a day (QID) | ORAL | Status: DC | PRN
  Administered 2018-12-18 – 2018-12-20 (×5): 650 mg via ORAL
  Filled 2018-12-18 (×6): qty 2

## 2018-12-18 MED ORDER — ZOLPIDEM TARTRATE 5 MG PO TABS
5.0000 mg | ORAL_TABLET | Freq: Every evening | ORAL | Status: DC | PRN
  Administered 2018-12-18 – 2018-12-19 (×2): 5 mg via ORAL
  Filled 2018-12-18 (×2): qty 1

## 2018-12-18 MED ORDER — IPRATROPIUM-ALBUTEROL 0.5-2.5 (3) MG/3ML IN SOLN: 3.0000 mL | RESPIRATORY_TRACT | Status: DC | PRN

## 2018-12-18 MED ORDER — LOSARTAN POTASSIUM 25 MG PO TABS
12.5000 mg | ORAL_TABLET | Freq: Every day | ORAL | Status: DC
  Administered 2018-12-18 – 2018-12-20 (×3): 12.5 mg via ORAL
  Filled 2018-12-18 (×4): qty 1

## 2018-12-18 MED ORDER — DOCUSATE SODIUM 100 MG PO CAPS: 100.0000 mg | ORAL_CAPSULE | Freq: Two times a day (BID) | ORAL | Status: DC | PRN

## 2018-12-18 MED ORDER — ORAL CARE MOUTH RINSE
15.0000 mL | Freq: Two times a day (BID) | OROMUCOSAL | Status: DC
  Administered 2018-12-18: 21:00:00 15 mL via OROMUCOSAL

## 2018-12-18 NOTE — Plan of Care (Signed)
Pt is alert and oriented, currently on bipap, no signs of shortness of breath or distress noted. Pt has been on 3L [place] or bipap throughout the night and tolerates it well. Pt did have some wheezing and diminished lung sounds, pt has been proactive with incentive spirometer and coughing. Pt complains of chronic pain and requests her home dose of medication; her request will be relayed to the incoming shift. Pt is now resting comfortably in her bed and refuses to air out her colostomy at this time. Pt stated, "I will call out for someone when it is ready, I know this thing." Pt voices no concerns or complaints at this time. Call bell is within reach and bed is in lowest position. Will continue to monitor. Problem: Education: Goal: Understanding of medication regimen will improve Outcome: Progressing   Problem: Cardiac: Goal: Ability to achieve and maintain adequate cardiopulmonary perfusion will improve Outcome: Progressing Goal: Vascular access site(s) Level 0-1 will be maintained Outcome: Progressing   Problem: Clinical Measurements: Goal: Respiratory complications will improve Outcome: Progressing

## 2018-12-18 NOTE — Progress Notes (Signed)
NAME:  Charlissa Petros, MRN:  [id number], DOB:  06/12/1955, LOS: 6 ADMISSION DATE:  12/12/2018, CONSULTATION DATE: 12/12/2018 REFERRING MD: Linard Millers, MD, CHIEF COMPLAINT: ST elevation MI, respiratory failure  Brief History   63 year old with severe COPD, lung cancer status post resection, active smoker Admitted with STelevation MI. Decompensated in the Cath Lab with hypercarbia and intubated.  PCCM consulted for vent management. She was treated with IABP after cath. Improved and has been extubated. PCCM signed off 10/6, but called back later that day for acute resp distress.  Afternoon of 10/8 patient seen for respiratory distress with reapplication of BIPAP with good response initially. However by that evening patient was seen with severe acute respiratory failure despite BIPAP therapy. Bedside ECHO with depressed LVEF and Bline in lungs prompting decision for reintubation    Past Medical History  Severe COPD, prior partial right lung resection for lung cancer 2015, history of colon cancer with colostomy.   She has severe COPD, lung cancer on Breo inhaler.  Follows at [facility] pulmonary.  Current active smoker.  Lung cancer was treated with chemotherapy, right upper lobectomy in 2015.  PET scan at [facility] April 2020 shows no evidence of recurrence.  Significant Hospital Events   10/5 Admit with MI, status post cath, intra-aortic balloon pump. 10/6 extubated, then resp distress later requiring BiPAP.  10/8 Re-intubated due to ARF and failure of BIPAP 10/10 extubated  Consults:  PCCM  Procedures:  Endotracheal tube 10/5 >> 10/6 Endotracheal tube 10/8 >> CVC L subclavian  Significant Diagnostic Tests:  CXR 12/17/2018- resolving right upper lobe infiltrate.  Chronic right hemidiaphragm tenting.   Micro Data:  12/15/2018 respiratory >> 12/14/2018 respiratory few GPC, rare GNR, PMNs>> normal flora 12/13/2018 blood>>   Antimicrobials:  Zosyn 10/5>>>10/8 Augmentin 10/9>>>10/11  Ceftriaxone 10/8 >>  Interim history/subjective:  Did well post extubation on BiPAP for a few hours.  Back on BiPAP as planned overnight.  She denies shortness of breath.  Tolerating a diet well  Objective   Blood pressure 101/67, pulse 77, temperature 97.8 F (36.6 C), temperature source Axillary, resp. rate (!) 27, height 5\' 8"  (1.727 m), weight 88.3 kg, SpO2 96 %. CVP:  [7 mmHg-10 mmHg] 7 mmHg  Vent Mode: BIPAP FiO2 (%):  [30 %] 30 % PEEP:  [4 cmH20-5 cmH20] 4 cmH20 Pressure Support:  [8 cmH20] 8 cmH20   Intake/Output Summary (Last 24 hours) at 12/18/2018 0931 Last data filed at 12/18/2018 0800 Gross per 24 hour  Intake 613 ml  Output 830 ml  Net -217 ml   Filed Weights   12/16/18 0500 12/17/18 0500 12/18/18 0500  Weight: 85.7 kg 87.1 kg 88.3 kg   Examination: General: Overly woman sitting up in bed in no acute distress HEENT: Cephalic, atraumatic, eyes anicteric Neuro: Awake and alert, normal speech, answering questions appropriately, moving all extremities spontaneously.   CV: S1-S2, regular rate and rhythm PULM: Breathing comfortably on nasal cannula, clear to auscultation bilaterally GI: Nondistended Extremities: No peripheral edema or cyanosis Skin: No rashes, left IJ line without erythema  Resolved Hospital Problem list   Hypercarbic, hypoxic respiratory failure in the setting of severe COPD, MI  Assessment & Plan:   Acute hypoxic and hypercapnic respiratory failure   History of COPD Right upper lobe pneumonia- cultures with normal flora, slowly resolving on chest x-ray.  Fevers resolving on antibiotics. -Respiratory distress reoccurred 10/6 after extubation requiring BiPAP. CXR with RUL opacification, but course felt to be more consistent with pulmonary edema.  -  Again seen n respiratory distress afternoon of 10/8, later that day failed BIPAP resulting in reintubation.  P: - Continue ceftriaxone to complete 7 days of antibiotics (stop date 10/14) -Stop  steroids today - Recommend restarting PTA Breo (ordered) -Duo nebs PRN - Recommend smoking cessation - Stable from a respiratory standpoint for down to the ICU  Sepsis due to HAP-sepsis resolved -Seen with T-max of 101.4 overnight, Tachypnea, elevated WBC, lactic acidosis, and radiologic appears of questionable signs of infection  P: -Continue ceftriaxone to complete a 7-day course (stop date 10/14) -Continue to follow cultures, so far unrevealing  History of lung cancer status post resection and radiation, active smoker.  Most recent PET 06/27/2018 with no recurrence  P: -Needs outpatient follow-up with her pulmonologist from [facility]  STEMI, previously in cardiogenic shock Acute Systolic Heart Failure (EF 40-45%) P: -Continue dual antiplatelet therapy, statin, losartan per cardiology    We will sign off.  Please call with questions.  Best practice:  Diet:  N.p.o. Pain/Anxiety/Delirium protocol (if indicated):  VAP protocol (if indicated): In place  DVT prophylaxis:  [place] heparin GI prophylaxis:  No longer required Glucose control: Monitor sugars Mobility:  Aggressive mobility Code Status: Full Family Communication: No family contact information in the chart Disposition: ICU  Labs   CBC: Recent Labs  Lab 12/12/18 0657  12/14/18 0229 12/15/18 0209 12/15/18 1945 12/15/18 1951 12/15/18 2117 12/16/18 0505 12/17/18 0346  WBC 12.8*   < > 20.5* 15.1*  --  23.9*  --  13.1* 10.4  NEUTROABS 10.7*  --   --   --   --   --   --   --   --   HGB 16.8*   < > 15.2* 12.8 15.6* 15.1* 15.0 13.5 12.8  HCT 53.9*   < > 49.8* 41.9 46.0 49.1* 44.0 43.8 42.3  MCV 90.4   < > 89.2 89.5  --  92.1  --  90.3 91.0  PLT 327   < > 299 258  --  320  --  246 224   < > = values in this interval not displayed.    Basic Metabolic Panel: Recent Labs  Lab 12/14/18 0229 12/15/18 0729 12/15/18 1945 12/15/18 1951 12/15/18 2117 12/16/18 0505 12/17/18 0346  NA 140 141 141 142 143 141 144  K  3.8 3.5 3.5 3.9 3.3* 3.4* 3.8  CL 106 102  --  100  --  101 107  CO2 24 26  --  27  --  26 26  GLUCOSE 140* 118*  --  285*  --  184* 149*  BUN 29* 42*  --  37*  --  39* 39*  CREATININE 1.08* 1.12*  --  1.25*  --  1.13* 0.95  CALCIUM 8.9 9.5  --  9.1  --  9.0 9.4  MG 2.1  --   --  2.1  --   --  2.6*   GFR: Estimated Creatinine Clearance: 71.4 mL/min (by C-G formula based on SCr of 0.95 mg/dL). Recent Labs  Lab 12/14/18 1049 12/15/18 0209 12/15/18 1951 12/15/18 2224 12/16/18 0013 12/16/18 0505 12/17/18 0346  PROCALCITON 0.48  --   --   --   --   --   --   WBC  --  15.1* 23.9*  --   --  13.1* 10.4  LATICACIDVEN  --   --  2.2* 2.0* 1.8  --   --     Liver Function Tests: Recent Labs  Lab  12/13/18 0429  AST 219*  ALT 69*  ALKPHOS 87  BILITOT 0.2*  PROT 6.2*  ALBUMIN 3.0*   No results for input(s): LIPASE, AMYLASE in the last 168 hours. No results for input(s): AMMONIA in the last 168 hours.  ABG    Component Value Date/Time   PHART 7.399 12/15/2018 2117   PCO2ART 45.3 12/15/2018 2117   PO2ART 72.0 (L) 12/15/2018 2117   HCO3 28.1 (H) 12/15/2018 2117   TCO2 29 12/15/2018 2117   ACIDBASEDEF 1.0 12/13/2018 2237   O2SAT 78.2 12/16/2018 0758     Coagulation Profile: Recent Labs  Lab 12/12/18 0657  INR 1.0    Cardiac Enzymes: No results for input(s): CKTOTAL, CKMB, CKMBINDEX, TROPONINI in the last 168 hours.  HbA1C: Hgb A1c MFr Bld  Date/Time Value Ref Range Status  12/12/2018 11:32 AM 5.4 4.8 - 5.6 % Final    Comment:    (NOTE) Pre diabetes:          5.7%-6.4% Diabetes:              >6.4% Glycemic control for   <7.0% adults with diabetes     CBG: Recent Labs  Lab 12/17/18 1549 12/17/18 1922 12/17/18 2309 12/18/18 0304 12/18/18 [place] 150* 175* 127* 121* 98    Julian Hy, DO 12/18/18 9:38 AM [facility]

## 2018-12-18 NOTE — Progress Notes (Addendum)
Progress Note  Patient Name: Chelsea Allison Date of Encounter: 12/18/2018  Primary Cardiologist: Sinclair Grooms, MD   Subjective   Denies chest pain, she has had anxiety attack and couldn't sleep.   Inpatient Medications    Scheduled Meds:  aspirin  81 mg Per Tube Daily   chlorhexidine gluconate (MEDLINE KIT)  15 mL Mouth Rinse BID   Chlorhexidine Gluconate Cloth  6 each Topical Daily   heparin  5,000 Units Subcutaneous Q8H   ipratropium-albuterol  3 mL Nebulization TID   mouth rinse  15 mL Mouth Rinse BID   methylPREDNISolone (SOLU-MEDROL) injection  40 mg Intravenous Q24H   metoprolol tartrate  5 mg Intravenous Q6H   pantoprazole (PROTONIX) IV  40 mg Intravenous QHS   rosuvastatin  10 mg Per Tube q1800   sodium chloride flush  3 mL Intravenous Q12H   ticagrelor  90 mg Oral BID   Continuous Infusions:  sodium chloride 10 mL/hr at 12/15/18 2010   cefTRIAXone (ROCEPHIN)  IV Stopped (12/17/18 2000)   propofol (DIPRIVAN) infusion Stopped (12/17/18 0702)   PRN Meds: acetaminophen, albuterol, bisacodyl, docusate, fentaNYL (SUBLIMAZE) injection, midazolam, nitroGLYCERIN, ondansetron (ZOFRAN) IV, sennosides, sodium chloride flush, sodium chloride flush, traMADol   Vital Signs    Vitals:   12/18/18 0600 12/18/18 0700 12/18/18 0705 12/18/18 0800  BP: 115/75 (!) 127/95 (!) 127/95 101/67  Pulse: 66 73 75 77  Resp: (!) 21 (!) 27 (!) 26 (!) 27  Temp:      TempSrc:      SpO2: 96% 99% 98% 96%  Weight:      Height:        Intake/Output Summary (Last 24 hours) at 12/18/2018 0857 Last data filed at 12/18/2018 0800 Gross per 24 hour  Intake 613 ml  Output 830 ml  Net -217 ml   Filed Weights   12/16/18 0500 12/17/18 0500 12/18/18 0500  Weight: 85.7 kg 87.1 kg 88.3 kg    Telemetry    NSR- Personally Reviewed  ECG    None today- Personally Reviewed  Physical Exam   GEN: No acute distress.  Awake, CPAP on.  Cardiac: RRR, no murmurs, rubs, or  gallops.  Respiratory:  clear MS: No edema; No deformity. Neuro:  Nonfocal  Psych: Normal affect   Labs    Chemistry Recent Labs  Lab 12/13/18 0429  12/15/18 1951 12/15/18 2117 12/16/18 0505 12/17/18 0346  NA 141   < > 142 143 141 144  K 3.1*   < > 3.9 3.3* 3.4* 3.8  CL 107   < > 100  --  101 107  CO2 22   < > 27  --  26 26  GLUCOSE 162*   < > 285*  --  184* 149*  BUN 26*   < > 37*  --  39* 39*  CREATININE 1.32*   < > 1.25*  --  1.13* 0.95  CALCIUM 8.7*   < > 9.1  --  9.0 9.4  PROT 6.2*  --   --   --   --   --   ALBUMIN 3.0*  --   --   --   --   --   AST 219*  --   --   --   --   --   ALT 69*  --   --   --   --   --   ALKPHOS 87  --   --   --   --   --  BILITOT 0.2*  --   --   --   --   --   GFRNONAA 43*   < > 46*  --  52* >60  GFRAA 50*   < > 53*  --  >60 >60  ANIONGAP 12   < > 15  --  14 11   < > = values in this interval not displayed.     Hematology Recent Labs  Lab 12/15/18 1951 12/15/18 2117 12/16/18 0505 12/17/18 0346  WBC 23.9*  --  13.1* 10.4  RBC 5.33*  --  4.85 4.65  HGB 15.1* 15.0 13.5 12.8  HCT 49.1* 44.0 43.8 42.3  MCV 92.1  --  90.3 91.0  MCH 28.3  --  27.8 27.5  MCHC 30.8  --  30.8 30.3  RDW 14.4  --  14.3 14.4  PLT 320  --  246 224    Cardiac EnzymesNo results for input(s): TROPONINI in the last 168 hours. No results for input(s): TROPIPOC in the last 168 hours.   BNP Recent Labs  Lab 12/12/18 1132  BNP 248.0*     DDimer No results for input(s): DDIMER in the last 168 hours.   Radiology    Dg Chest Port 1 View  Result Date: 12/17/2018 CLINICAL DATA:  63 year old female with acute respiratory failure. EXAM: PORTABLE CHEST 1 VIEW COMPARISON:  Prior chest x-ray 17 Jul 2026 FINDINGS: The tip of the endotracheal tube is 2.1 cm above the carina. Left IJ central venous catheter remains in place. However, the tip is changed location and is now elective within the azygos vein. Gastric tube remains present. The tip is not identified but  lies below the field of view. Stable cardiac and mediastinal contours. Atherosclerotic calcifications again noted in the transverse aorta. Post radiation changes in the medial right upper lobe with associated peaking of the right hemidiaphragm. No new background airspace opacity, pulmonary edema, pleural effusion or pneumothorax. No acute osseous abnormality. IMPRESSION: 1. The tip of the left IJ approach central venous catheter has migrated the azygos vein. 2. Other support apparatus in satisfactory position. 3. Otherwise, stable appearance of the chest.  No acute findings. Electronically Signed   By: Jacqulynn Cadet M.D.   On: 12/17/2018 09:48    Cardiac Studies   Echocardiogram December 12, 2018 1. Technically difficult study with limited views. Left ventricular ejection fraction, by visual estimation, is 35 to 40%. Hypokinesis of apex, anterior/septal/lateral apical walls, mid to basal inferolateral wall 2. Global right ventricle has normal systolic function.The right ventricular size is normal.  FINDINGS Left Ventricle: Left ventricular ejection fraction, by visual estimation, is 35 to 40%. The left ventricle has moderately decreased function. There is mildly increased left ventricular hypertrophy. Concentric left ventricular hypertrophy. Spectral  Doppler shows Left ventricular diastolic Doppler parameters are consistent with impaired relaxation pattern of LV diastolic filling.  Right Ventricle: The right ventricular size is normal. No increase in right ventricular wall thickness. Global RV systolic function is has normal systolic function.  Left Atrium: Left atrial size was normal in size.  Right Atrium: Right atrial size was not well visualized  Pericardium: The pericardium was not well visualized.  Mitral Valve: The mitral valve was not well visualized. Not assessed mitral valve regurgitation.  Tricuspid Valve: The tricuspid valve is not well visualized. Tricuspid valve  regurgitation was not assessed by color flow Doppler.  Aortic Valve: The aortic valve was not well visualized. Aortic valve regurgitation was not assessed by color flow Doppler.  Pulmonic Valve: The pulmonic valve was not well visualized. Pulmonic valve regurgitation was not assessed by color flow Doppler.  Aorta: The aortic root was not well visualized.  IAS/Shunts: The interatrial septum was not well visualized.   December 12, 2018-Left heart cath and coronary angiography   Acute lateral wall myocardial infarction due to thrombotic occlusion of the circumflex.  High-grade obstruction of the proximal RCA with collaterals to the apical LAD  Chronic total occlusion of the proximal LAD.  PTCA and stent implantation proximal to mid circumflex reducing 100% stenosis to less than 10% with TIMI grade III flow. Final post dual balloon diameter 2.83 mm using A 22 x 2.75 Onyx. No postdilatation was performed.  Mid anterior wall akinesis with anteroapical hypokinesis. LVEDP 25 mmHg.  Mean pulmonary capillary wedge pressure 7 mmHg, PA pressure 24 over 12 mmHg.  Intra-aortic balloon pump at 1:1 pumping.  Altered mental status prior to intubation. Etiology is unclear but likely related to CO2 narcosis. Rule out intracranial hemorrhage/bleed in the setting of intense anticoagulation.  Patient Profile     63 y.o.femalewith medical history significant for COPD, lung cancer status post pneumonectomy, tobacco use disorder, colon cancer with colostomy who presented to [facility], ED on December 12, 2018 with chest pain found to have STEMI found to have high-grade obstruction of proximal RCA status post PTCA and stent implantation of proximal to mid circumflex. During her coronary intervention, she developed acute encephalopathy and hypercarbia with PCO2 of 81 which warranted emergent intubation. Due to hypoxia and elevated left ventricular end-diastolic pressure of 28 mm per mercury, and  intra-aortic balloon pump was inserted.  Assessment & Plan    1. Acute lateral STEMI s/p PTCA and stent implantation proximal to mid circumflex on 12/12/18. -Continue aspirin, Lipitor, Brilinta, Hb 12.8  2. Acute Systolic heart failure (EF 35-40%) - will hold bisoprolol as her BP is soft, I will start a very low dose metoprolol 12.5 mg PO BID, consider switching back to bisoprolol or carvedilol once BP improves - I will start losartan 12.5 mg po daily today - she appears euvolemic, no diuretics right now, CVP 7 Echo showed inferior wall severe HK but overall EF is mildly reduced. I would estimate EF of 45%.  2 runs of ns VT - the longest lasting 14 beats in the last 24 hours  3. AcuteHypercarbic/hypoxicrespiratory failure - multifactorial due to COPD, ? SIR/PNA, CHF -now extubated, started on CPAP  4. Right upper lobe pneumonia -No fever anymore, continue Ceftriaxone for 7 days for total of 7 days, d/c solu-medrol.  5. History of lung CA s/p partial right lung resection.  6. History of colon CA s/p colostomy.   7. Anxiety attack, insomnia - I will start ambien at night  Start mobilization, physical therapy, transfer to the floor in the am if she is doing well.  The patient is critically ill with multiple organ systems failure and requires high complexity decision making for assessment and support, frequent evaluation and titration of therapies, application of advanced monitoring technologies and extensive interpretation of multiple databases.  For questions or updates, please contact [place] Please consult [email or web address] for contact info under Cardiology/STEMI.     Signed, Ena Dawley, MD  12/18/2018, 8:57 AM

## 2018-12-19 DIAGNOSIS — Z720 Tobacco use: Secondary | ICD-10-CM

## 2018-12-19 LAB — GLUCOSE, CAPILLARY
Glucose-Capillary: 93 mg/dL (ref 70–99)
Glucose-Capillary: 111 mg/dL — ABNORMAL HIGH (ref 70–99)
Glucose-Capillary: 92 mg/dL (ref 70–99)
Glucose-Capillary: 80 mg/dL (ref 70–99)
Glucose-Capillary: 96 mg/dL (ref 70–99)

## 2018-12-19 LAB — CBC
HCT: 45.6 % (ref 36.0–46.0)
Hemoglobin: 13.7 g/dL (ref 12.0–15.0)
MCH: 27.1 pg (ref 26.0–34.0)
MCHC: 30 g/dL (ref 30.0–36.0)
MCV: 90.1 fL (ref 80.0–100.0)
Platelets: 290 10*3/uL (ref 150–400)
RBC: 5.06 MIL/uL (ref 3.87–5.11)
RDW: 14.1 % (ref 11.5–15.5)
WBC: 14 10*3/uL — ABNORMAL HIGH (ref 4.0–10.5)
nRBC: 0 % (ref 0.0–0.2)

## 2018-12-19 LAB — BASIC METABOLIC PANEL
Anion gap: 9 (ref 5–15)
BUN: 34 mg/dL — ABNORMAL HIGH (ref 8–23)
CO2: 27 mmol/L (ref 22–32)
Calcium: 9.3 mg/dL (ref 8.9–10.3)
Chloride: 102 mmol/L (ref 98–111)
Creatinine, Ser: 0.97 mg/dL (ref 0.44–1.00)
GFR calc Af Amer: >60 mL/min (ref >60)
GFR calc non Af Amer: >60 mL/min (ref >60)
Glucose, Bld: 83 mg/dL (ref 70–99)
Potassium: 3.6 mmol/L (ref 3.5–5.1)
Sodium: 138 mmol/L (ref 135–145)

## 2018-12-19 MED ORDER — ALPRAZOLAM 0.5 MG PO TABS
0.5000 mg | ORAL_TABLET | Freq: Two times a day (BID) | ORAL | Status: DC | PRN
  Administered 2018-12-19 – 2018-12-20 (×2): 0.5 mg via ORAL
  Filled 2018-12-19 (×2): qty 1

## 2018-12-19 MED ORDER — STERILE WATER FOR INJECTION IJ SOLN
INTRAMUSCULAR | Status: AC
  Filled 2018-12-19: qty 10

## 2018-12-19 MED ORDER — ENSURE MAX PROTEIN PO LIQD
11.0000 [oz_av] | Freq: Every day | ORAL | Status: DC
  Filled 2018-12-19 (×2): qty 330

## 2018-12-19 MED ORDER — SODIUM CHLORIDE 0.9% FLUSH: 3.0000 mL | Freq: Two times a day (BID) | INTRAVENOUS | Status: DC

## 2018-12-19 NOTE — H&P (View-Only) (Signed)
 Progress Note  Patient Name: Chelsea Allison Date of Encounter: 12/19/2018  Primary Cardiologist: Henry W Smith III, MD   Subjective   Feels well.  Did well walking.  No sx like her MI.  Inpatient Medications    Scheduled Meds: . aspirin  81 mg Oral Daily  . chlorhexidine gluconate (MEDLINE KIT)  15 mL Mouth Rinse BID  . Chlorhexidine Gluconate Cloth  6 each Topical Daily  . fluticasone furoate-vilanterol  1 puff Inhalation Daily  . heparin  5,000 Units Subcutaneous Q8H  . losartan  12.5 mg Oral Daily  . mouth rinse  15 mL Mouth Rinse BID  . metoprolol tartrate  12.5 mg Oral BID  . pantoprazole  40 mg Oral Daily  . rosuvastatin  10 mg Oral q1800  . sodium chloride flush  3 mL Intravenous Q12H  . ticagrelor  90 mg Oral BID   Continuous Infusions: . sodium chloride 10 mL/hr at 12/15/18 2010  . cefTRIAXone (ROCEPHIN)  IV 2 g (12/18/18 1918)   PRN Meds: acetaminophen, bisacodyl, docusate sodium, ipratropium-albuterol, nitroGLYCERIN, ondansetron (ZOFRAN) IV, senna, sodium chloride flush, sodium chloride flush, traMADol, zolpidem   Vital Signs    Vitals:   12/19/18 0600 12/19/18 0629 12/19/18 0743 12/19/18 0800  BP:  116/72  107/65  Pulse: 74   68  Resp: (!) 26 (!) 26  19  Temp:   98 F (36.7 C)   TempSrc:   Oral   SpO2: 98%   96%  Weight:      Height:        Intake/Output Summary (Last 24 hours) at 12/19/2018 1000 Last data filed at 12/19/2018 0600 Gross per 24 hour  Intake 603 ml  Output 1325 ml  Net -722 ml   Last 3 Weights 12/19/2018 12/18/2018 12/17/2018  Weight (lbs) 190 lb 7.6 oz 194 lb 10.7 oz 192 lb 0.3 oz  Weight (kg) 86.4 kg 88.3 kg 87.1 kg      Telemetry    NSR - Personally Reviewed  ECG    NSR, inferior Q waves - Personally Reviewed  Physical Exam   GEN: No acute distress.   Neck: No JVD Cardiac: RRR, no murmurs, rubs, or gallops.  Respiratory: Clear to auscultation bilaterally. GI: Soft, nontender, non-distended  MS: Tr LE edema; No  deformity. Neuro:  Nonfocal  Psych: Normal affect   Labs    High Sensitivity Troponin:   Recent Labs  Lab 12/12/18 0657 12/12/18 1132  TROPONINIHS 65* >27,000*      Chemistry Recent Labs  Lab 12/13/18 0429  12/15/18 1951 12/15/18 2117 12/16/18 0505 12/17/18 0346  NA 141   < > 142 143 141 144  K 3.1*   < > 3.9 3.3* 3.4* 3.8  CL 107   < > 100  --  101 107  CO2 22   < > 27  --  26 26  GLUCOSE 162*   < > 285*  --  184* 149*  BUN 26*   < > 37*  --  39* 39*  CREATININE 1.32*   < > 1.25*  --  1.13* 0.95  CALCIUM 8.7*   < > 9.1  --  9.0 9.4  PROT 6.2*  --   --   --   --   --   ALBUMIN 3.0*  --   --   --   --   --   AST 219*  --   --   --   --   --     ALT 69*  --   --   --   --   --   ALKPHOS 87  --   --   --   --   --   BILITOT 0.2*  --   --   --   --   --   GFRNONAA 43*   < > 46*  --  52* >60  GFRAA 50*   < > 53*  --  >60 >60  ANIONGAP 12   < > 15  --  14 11   < > = values in this interval not displayed.     Hematology Recent Labs  Lab 12/16/18 0505 12/17/18 0346 12/19/18 0911  WBC 13.1* 10.4 14.0*  RBC 4.85 4.65 5.06  HGB 13.5 12.8 13.7  HCT 43.8 42.3 45.6  MCV 90.3 91.0 90.1  MCH 27.8 27.5 27.1  MCHC 30.8 30.3 30.0  RDW 14.3 14.4 14.1  PLT 246 224 290    BNP Recent Labs  Lab 12/12/18 1132  BNP 248.0*     DDimer No results for input(s): DDIMER in the last 168 hours.   Radiology    No results found.  Cardiac Studies   10/9 echo:  1. Left ventricular ejection fraction, by visual estimation, is 40 to 45%. The left ventricle has moderately decreased function. Normal left ventricular size. There is no left ventricular hypertrophy.  2. Entire inferolateral wall is abnormal.  3. Global right ventricle has normal systolic function.The right ventricular size is normal. No increase in right ventricular wall thickness.  4. Left atrial size was normal.  5. Right atrial size was normal.  6. Moderate mitral annular calcification.  7. The mitral valve is  normal in structure. Mild mitral valve regurgitation. No evidence of mitral stenosis.  8. The tricuspid valve is normal in structure. Tricuspid valve regurgitation was not visualized by color flow Doppler.  9. The aortic valve is normal in structure. Aortic valve regurgitation was not visualized by color flow Doppler. Structurally normal aortic valve, with no evidence of sclerosis or stenosis. 10. The pulmonic valve was normal in structure. Pulmonic valve regurgitation is not visualized by color flow Doppler. 11. The inferior vena cava is normal in size with greater than 50% respiratory variability, suggesting right atrial pressure of 3 mmHg.  Patient Profile     63 y.o. female with lateral MI. Cath showed:  Acute lateral wall myocardial infarction due to thrombotic occlusion of the circumflex.  High-grade obstruction of the proximal RCA with collaterals to the apical LAD  Chronic total occlusion of the proximal LAD.  PTCA and stent implantation proximal to mid circumflex reducing 100% stenosis to less than 10% with TIMI grade III flow.  Final post dual balloon diameter 2.83 mm using A 22 x 2.75 Onyx.  No postdilatation was performed.  Mid anterior wall akinesis with anteroapical hypokinesis.  LVEDP 25 mmHg.  Assessment & Plan    1) CAD: DOing much better now.  COntinue DAPT with aggressive secondary prevention.  COnsider PCI of RCA tomorrow if renal function is ok, given that she has CTO of the LAD and collaterals are dependent on the RCA.  High dose statin.  She needs to stop smoking.  Offered nicotine patch.  SHe has had success with Chantix in the past.  2) Move to tele. OK to d/c central line once PIV obtained.  For questions or updates, please contact [facility] Please consult [email or web address] for contact info under          Signed, Jayadeep Varanasi, MD  12/19/2018, 10:00 AM   

## 2018-12-19 NOTE — Progress Notes (Signed)
Nutrition Follow-up  DOCUMENTATION CODES:   Not applicable  INTERVENTION:   - Ensure Max po daily, each supplement provides 150 kcal and 30 grams of protein  - Encourage adequate PO intake  NUTRITION DIAGNOSIS:   Inadequate oral intake related to inability to eat as evidenced by NPO status.  Progressing, pt now on Heart Healthy/Carb Modified diet  GOAL:   Patient will meet greater than or equal to 90% of their needs  Progressing  MONITOR:   PO intake, Supplement acceptance, Labs, Weight trends, I & O's  REASON FOR ASSESSMENT:   Ventilator, Consult Enteral/tube feeding initiation and management  ASSESSMENT:   63 year old female who presented on 10/5 with cheat pain. Code STEMI called. PMH of HTN, lung cancer s/p resection, colon cancer with rectal cancer s/p colostomy, COPD. Pt taken emergently to the cath lab and is s/p PTCA and stent implantation of the proximal to mid circumflex.  10/6 - extubated, respiratory distress requiring BiPAP 10/8 - respiratory distress requiring intubation 10/10 - extubated  Weight up 1 lb since admit.  Spoke with pt at bedside. Pt reports appetite is improving and she ate well at breakfast. Pt states that PTA, she would eat 1 "good meal" daily and snack throughout the day especially at night. Pt reports that her weight fluctuates but that she has been trying to lose weight and has lost about 15 lbs.  Pt amenable to RD ordering an oral nutrition supplement to aid in meeting kcal and protein needs.  Meal Completion: 50-100% x 3 meals  Medications reviewed and include: Protonix, IV abx  Labs reviewed. CBG's: 93-151 x 24 hours  UOP: 1475 ml x 24 hours Stool: 100 ml x 24 hours via colostomy I/O's: -4.5 L since admit  NUTRITION - FOCUSED PHYSICAL EXAM:    Most Recent Value  Orbital Region  No depletion  Upper Arm Region  No depletion  Thoracic and Lumbar Region  No depletion  Buccal Region  No depletion  Temple Region  No  depletion  Clavicle Bone Region  No depletion  Clavicle and Acromion Bone Region  No depletion  Scapular Bone Region  No depletion  Dorsal Hand  No depletion  Patellar Region  No depletion  Anterior Thigh Region  No depletion  Posterior Calf Region  Mild depletion  Edema (RD Assessment)  Unable to assess  Hair  Reviewed  Eyes  Reviewed  Mouth  Reviewed  Skin  Reviewed  Nails  Reviewed       Diet Order:   Diet Order            Diet heart healthy/carb modified Room service appropriate? Yes; Fluid consistency: Thin  Diet effective now              EDUCATION NEEDS:   No education needs have been identified at this time  Skin:  Skin Assessment: Reviewed RN Assessment  Last BM:  12/19/18 colostomy  Height:   Ht Readings from Last 1 Encounters:  12/12/18 5\' 8"  (1.727 m)    Weight:   Wt Readings from Last 1 Encounters:  12/19/18 86.4 kg    Ideal Body Weight:  63.6 kg  BMI:  Body mass index is 28.96 kg/m.  Estimated Nutritional Needs:   Kcal:  1900-2100  Protein:  95-110 grams  Fluid:  >/= 1.9 L    Gaynell Face, MS, RD, LDN Inpatient Clinical Dietitian Pager: [phone] Weekend/After Hours: [phone]

## 2018-12-19 NOTE — Progress Notes (Signed)
Physical Therapy Treatment Patient Details Name: Chelsea Allison MRN: [id number] DOB: [birth year] Today's Date: 12/19/2018    History of Present Illness Pt is a 63 yo female admitted with MI and then decompensated in the cath lab requiring intubation 10/5-10/6. Overnight on 10/6 she had increased WOB requiring BiPAP and CXR showed a new RUL infiltrate. PMH: severe COPD, lung cancer s/p partial R lung resection, colon cancer with nephrostomy, active smoker, HTN.  Patient re-intubated 10/8-10/10.    PT Comments    Patient limited this session due to fatigue from prior ambulation x 3 with nursing and cardiac rehab.  SpO2 94% on RA throughout session.  Discussion regarding plans for d/c, states she has a friend that is planning to stay with her several days initially and has other friends/family who can stay as well.  She is not interested in SNF and hopes to be able to go home with assistance and follow up HHPT.  Will need a RW, shower seat and possibly Ivy aide.  PT to follow acutely.    Follow Up Recommendations  Home health PT;Supervision/Assistance - 24 hour(HH aide)     Equipment Recommendations  Rolling walker with 5" wheels;Other (comment)(shower chair)    Recommendations for Other Services       Precautions / Restrictions Precautions Precautions: Fall Precaution Comments: Easily SOB (h/o lung CA); ostomy bag    Mobility  Bed Mobility   Bed Mobility: Sit to Supine       Sit to supine: Supervision   General bed mobility comments: assist for lines only  Transfers Overall transfer level: Needs assistance Equipment used: None Transfers: Sit to/from [employer] Transfers Sit to Stand: Supervision Stand pivot transfers: Min guard       General transfer comment: assist for lines only  Ambulation/Gait             General Gait Details: declined, has walker 3x already today between nursing and cardiac rehab   Stairs             Wheelchair Mobility     Modified Rankin (Stroke Patients Only)       Balance Overall balance assessment: Needs assistance   Sitting balance-Leahy Scale: Good     Standing balance support: No upper extremity supported Standing balance-Leahy Scale: Good Standing balance comment: able to stand and step to bed no device, no LOB                            Cognition Arousal/Alertness: Awake/alert Behavior During Therapy: WFL for tasks assessed/performed Overall Cognitive Status: Within Functional Limits for tasks assessed                                        Exercises      General Comments General comments (skin integrity, edema, etc.): on RA throughout SpO2 94%      Pertinent Vitals/Pain Pain Assessment: Faces Faces Pain Scale: No hurt    Home Living                      Prior Function            PT Goals (current goals can now be found in the care plan section) Progress towards PT goals: Progressing toward goals    Frequency    Min 3X/week      PT Plan  Discharge plan needs to be updated    Co-evaluation              AM-PAC PT "6 Clicks" Mobility   Outcome Measure  Help needed turning from your back to your side while in a flat bed without using bedrails?: None Help needed moving from lying on your back to sitting on the side of a flat bed without using bedrails?: None Help needed moving to and from a bed to a chair (including a wheelchair)?: A Little Help needed standing up from a chair using your arms (e.g., wheelchair or bedside chair)?: None Help needed to walk in hospital room?: A Little Help needed climbing 3-5 steps with a railing? : A Little 6 Click Score: 21    End of Session   Activity Tolerance: Patient limited by fatigue Patient left: in bed;with call bell/phone within reach   PT Visit Diagnosis: Other abnormalities of gait and mobility (R26.89);Muscle weakness (generalized) (M62.81)     Time: 1610-9604 PT Time  Calculation (min) (ACUTE ONLY): 13 min  Charges:  $Therapeutic Activity: 8-22 mins                     Magda Kiel, [place] [facility] [phone] 12/19/2018    Reginia Naas 12/19/2018, 4:45 PM

## 2018-12-19 NOTE — Progress Notes (Addendum)
Progress Note  Patient Name: Chelsea Allison Date of Encounter: 12/19/2018  Primary Cardiologist: Sinclair Grooms, MD   Subjective   Feels well.  Did well walking.  No sx like her MI.  Inpatient Medications    Scheduled Meds: . aspirin  81 mg Oral Daily  . chlorhexidine gluconate (MEDLINE KIT)  15 mL Mouth Rinse BID  . Chlorhexidine Gluconate Cloth  6 each Topical Daily  . fluticasone furoate-vilanterol  1 puff Inhalation Daily  . heparin  5,000 Units Subcutaneous Q8H  . losartan  12.5 mg Oral Daily  . mouth rinse  15 mL Mouth Rinse BID  . metoprolol tartrate  12.5 mg Oral BID  . pantoprazole  40 mg Oral Daily  . rosuvastatin  10 mg Oral q1800  . sodium chloride flush  3 mL Intravenous Q12H  . ticagrelor  90 mg Oral BID   Continuous Infusions: . sodium chloride 10 mL/hr at 12/15/18 2010  . cefTRIAXone (ROCEPHIN)  IV 2 g (12/18/18 1918)   PRN Meds: acetaminophen, bisacodyl, docusate sodium, ipratropium-albuterol, nitroGLYCERIN, ondansetron (ZOFRAN) IV, senna, sodium chloride flush, sodium chloride flush, traMADol, zolpidem   Vital Signs    Vitals:   12/19/18 0600 12/19/18 0629 12/19/18 0743 12/19/18 0800  BP:  116/72  107/65  Pulse: 74   68  Resp: (!) 26 (!) 26  19  Temp:   98 F (36.7 C)   TempSrc:   Oral   SpO2: 98%   96%  Weight:      Height:        Intake/Output Summary (Last 24 hours) at 12/19/2018 1000 Last data filed at 12/19/2018 0600 Gross per 24 hour  Intake 603 ml  Output 1325 ml  Net -722 ml   Last 3 Weights 12/19/2018 12/18/2018 12/17/2018  Weight (lbs) 190 lb 7.6 oz 194 lb 10.7 oz 192 lb 0.3 oz  Weight (kg) 86.4 kg 88.3 kg 87.1 kg      Telemetry    NSR - Personally Reviewed  ECG    NSR, inferior Q waves - Personally Reviewed  Physical Exam   GEN: No acute distress.   Neck: No JVD Cardiac: RRR, no murmurs, rubs, or gallops.  Respiratory: Clear to auscultation bilaterally. GI: Soft, nontender, non-distended  MS: Tr LE edema; No  deformity. Neuro:  Nonfocal  Psych: Normal affect   Labs    High Sensitivity Troponin:   Recent Labs  Lab 12/12/18 0657 12/12/18 1132  TROPONINIHS 65* >27,000*      Chemistry Recent Labs  Lab 12/13/18 0429  12/15/18 1951 12/15/18 2117 12/16/18 0505 12/17/18 0346  NA 141   < > 142 143 141 144  K 3.1*   < > 3.9 3.3* 3.4* 3.8  CL 107   < > 100  --  101 107  CO2 22   < > 27  --  26 26  GLUCOSE 162*   < > 285*  --  184* 149*  BUN 26*   < > 37*  --  39* 39*  CREATININE 1.32*   < > 1.25*  --  1.13* 0.95  CALCIUM 8.7*   < > 9.1  --  9.0 9.4  PROT 6.2*  --   --   --   --   --   ALBUMIN 3.0*  --   --   --   --   --   AST 219*  --   --   --   --   --  ALT 69*  --   --   --   --   --   ALKPHOS 87  --   --   --   --   --   BILITOT 0.2*  --   --   --   --   --   GFRNONAA 43*   < > 46*  --  52* >60  GFRAA 50*   < > 53*  --  >60 >60  ANIONGAP 12   < > 15  --  14 11   < > = values in this interval not displayed.     Hematology Recent Labs  Lab 12/16/18 0505 12/17/18 0346 12/19/18 0911  WBC 13.1* 10.4 14.0*  RBC 4.85 4.65 5.06  HGB 13.5 12.8 13.7  HCT 43.8 42.3 45.6  MCV 90.3 91.0 90.1  MCH 27.8 27.5 27.1  MCHC 30.8 30.3 30.0  RDW 14.3 14.4 14.1  PLT 246 224 290    BNP Recent Labs  Lab 12/12/18 1132  BNP 248.0*     DDimer No results for input(s): DDIMER in the last 168 hours.   Radiology    No results found.  Cardiac Studies   10/9 echo:  1. Left ventricular ejection fraction, by visual estimation, is 40 to 45%. The left ventricle has moderately decreased function. Normal left ventricular size. There is no left ventricular hypertrophy.  2. Entire inferolateral wall is abnormal.  3. Global right ventricle has normal systolic function.The right ventricular size is normal. No increase in right ventricular wall thickness.  4. Left atrial size was normal.  5. Right atrial size was normal.  6. Moderate mitral annular calcification.  7. The mitral valve is  normal in structure. Mild mitral valve regurgitation. No evidence of mitral stenosis.  8. The tricuspid valve is normal in structure. Tricuspid valve regurgitation was not visualized by color flow Doppler.  9. The aortic valve is normal in structure. Aortic valve regurgitation was not visualized by color flow Doppler. Structurally normal aortic valve, with no evidence of sclerosis or stenosis. 10. The pulmonic valve was normal in structure. Pulmonic valve regurgitation is not visualized by color flow Doppler. 11. The inferior vena cava is normal in size with greater than 50% respiratory variability, suggesting right atrial pressure of 3 mmHg.  Patient Profile     63 y.o. female with lateral MI. Cath showed:  Acute lateral wall myocardial infarction due to thrombotic occlusion of the circumflex.  High-grade obstruction of the proximal RCA with collaterals to the apical LAD  Chronic total occlusion of the proximal LAD.  PTCA and stent implantation proximal to mid circumflex reducing 100% stenosis to less than 10% with TIMI grade III flow.  Final post dual balloon diameter 2.83 mm using A 22 x 2.75 Onyx.  No postdilatation was performed.  Mid anterior wall akinesis with anteroapical hypokinesis.  LVEDP 25 mmHg.  Assessment & Plan    1) CAD: DOing much better now.  COntinue DAPT with aggressive secondary prevention.  COnsider PCI of RCA tomorrow if renal function is ok, given that she has CTO of the LAD and collaterals are dependent on the RCA.  High dose statin.  She needs to stop smoking.  Offered nicotine patch.  SHe has had success with Chantix in the past.  2) Move to tele. OK to d/c central line once PIV obtained.  For questions or updates, please contact [facility] Please consult [email or web address] for contact info under          Signed,  , MD  12/19/2018, 10:00 AM   

## 2018-12-19 NOTE — Progress Notes (Signed)
CARDIAC REHAB PHASE I   PRE:  Rate/Rhythm: 34 SR with PVC/PACs    BP: sitting 104/67    SaO2: 98 2L, 93 RA  MODE:  Ambulation: 290 ft   POST:  Rate/Rhythm: 85 SR    BP: sitting 102/60     SaO2: 94 RA  Pt able to go to BR, change colostomy bag and walk with RW, no O2. Slow pace but overall did well. Kept O2 off. Will need to eval tonight while asleep. She would benefit from RW for home and also prob [facility] services. PT to eval later. Discussed MI, stents, Brilinta, restrictions, smoking cessation, diet, exercise on her own, NTG, and CRPII. Voiced understanding but could use reiteration as distracted at times. She is going to try to quit smoking. Gave resources. Will refer to [place] at her request. [place], ACSM 12/19/2018 2:38 PM

## 2018-12-20 ENCOUNTER — Encounter (HOSPITAL_COMMUNITY): Payer: Self-pay | Admitting: Cardiovascular Disease

## 2018-12-20 ENCOUNTER — Encounter (HOSPITAL_COMMUNITY)
Admission: EM | Disposition: A | Discharge status: Home Health | Payer: Self-pay | Source: Home / Self Care | Attending: Interventional Cardiology

## 2018-12-20 DIAGNOSIS — I10 Essential (primary) hypertension: Secondary | ICD-10-CM

## 2018-12-20 DIAGNOSIS — Z720 Tobacco use: Secondary | ICD-10-CM

## 2018-12-20 DIAGNOSIS — J189 Pneumonia, unspecified organism: Secondary | ICD-10-CM

## 2018-12-20 DIAGNOSIS — I25110 Atherosclerotic heart disease of native coronary artery with unstable angina pectoris: Secondary | ICD-10-CM

## 2018-12-20 DIAGNOSIS — I2510 Atherosclerotic heart disease of native coronary artery without angina pectoris: Secondary | ICD-10-CM

## 2018-12-20 DIAGNOSIS — E782 Mixed hyperlipidemia: Secondary | ICD-10-CM

## 2018-12-20 DIAGNOSIS — I200 Unstable angina: Secondary | ICD-10-CM

## 2018-12-20 DIAGNOSIS — E785 Hyperlipidemia, unspecified: Secondary | ICD-10-CM

## 2018-12-20 HISTORY — PX: CORONARY STENT INTERVENTION: CATH118234

## 2018-12-20 LAB — GLUCOSE, CAPILLARY
Glucose-Capillary: 125 mg/dL — ABNORMAL HIGH (ref 70–99)
Glucose-Capillary: 138 mg/dL — ABNORMAL HIGH (ref 70–99)
Glucose-Capillary: 87 mg/dL (ref 70–99)
Glucose-Capillary: 90 mg/dL (ref 70–99)
Glucose-Capillary: 91 mg/dL (ref 70–99)

## 2018-12-20 LAB — CBC
HCT: 45.2 % (ref 36.0–46.0)
Hemoglobin: 14.5 g/dL (ref 12.0–15.0)
MCH: 28 pg (ref 26.0–34.0)
MCHC: 32.1 g/dL (ref 30.0–36.0)
MCV: 87.3 fL (ref 80.0–100.0)
Platelets: 250 10*3/uL (ref 150–400)
RBC: 5.18 MIL/uL — ABNORMAL HIGH (ref 3.87–5.11)
RDW: 13.8 % (ref 11.5–15.5)
WBC: 11.5 10*3/uL — ABNORMAL HIGH (ref 4.0–10.5)
nRBC: 0 % (ref 0.0–0.2)

## 2018-12-20 LAB — POCT ACTIVATED CLOTTING TIME
Activated Clotting Time: 786 seconds
Activated Clotting Time: 835 seconds

## 2018-12-20 SURGERY — CORONARY STENT INTERVENTION
Anesthesia: LOCAL

## 2018-12-20 MED ORDER — HEPARIN SODIUM (PORCINE) 1000 UNIT/ML IJ SOLN
INTRAMUSCULAR | Status: AC
  Filled 2018-12-20: qty 1

## 2018-12-20 MED ORDER — TICAGRELOR 90 MG PO TABS: 90.0000 mg | ORAL_TABLET | Freq: Two times a day (BID) | ORAL | 3 refills | Status: DC

## 2018-12-20 MED ORDER — METOPROLOL SUCCINATE ER 25 MG PO TB24: 25.0000 mg | ORAL_TABLET | Freq: Every day | ORAL | 6 refills | Status: DC

## 2018-12-20 MED ORDER — SODIUM CHLORIDE 0.9% FLUSH: 3.0000 mL | INTRAVENOUS | Status: DC | PRN

## 2018-12-20 MED ORDER — ROSUVASTATIN CALCIUM 5 MG PO TABS
10.0000 mg | ORAL_TABLET | Freq: Every day | ORAL | Status: DC
  Administered 2018-12-20: 17:00:00 10 mg via ORAL
  Filled 2018-12-20: qty 2

## 2018-12-20 MED ORDER — HEPARIN SODIUM (PORCINE) 5000 UNIT/ML IJ SOLN: 5000.0000 [IU] | Freq: Three times a day (TID) | INTRAMUSCULAR | Status: DC

## 2018-12-20 MED ORDER — VERAPAMIL HCL 2.5 MG/ML IV SOLN
INTRAVENOUS | Status: AC
  Filled 2018-12-20: qty 2

## 2018-12-20 MED ORDER — HEPARIN (PORCINE) IN NACL 1000-0.9 UT/500ML-% IV SOLN
INTRAVENOUS | Status: DC | PRN
  Administered 2018-12-20 (×2): 500 mL

## 2018-12-20 MED ORDER — HEPARIN (PORCINE) IN NACL 1000-0.9 UT/500ML-% IV SOLN
INTRAVENOUS | Status: AC
  Filled 2018-12-20: qty 1500

## 2018-12-20 MED ORDER — SODIUM CHLORIDE 0.9 % IV SOLN
INTRAVENOUS | Status: DC
  Administered 2018-12-20: 09:00:00 via INTRAVENOUS

## 2018-12-20 MED ORDER — FENTANYL CITRATE (PF) 100 MCG/2ML IJ SOLN
INTRAMUSCULAR | Status: AC
  Filled 2018-12-20: qty 2

## 2018-12-20 MED ORDER — SODIUM CHLORIDE 0.9 % WEIGHT BASED INFUSION: 1.0000 mL/kg/h | INTRAVENOUS | Status: DC

## 2018-12-20 MED ORDER — IOHEXOL 350 MG/ML SOLN
INTRAVENOUS | Status: DC | PRN
  Administered 2018-12-20: 55 mL

## 2018-12-20 MED ORDER — ROSUVASTATIN CALCIUM 10 MG PO TABS: 10.0000 mg | ORAL_TABLET | Freq: Every day | ORAL | 6 refills | Status: DC

## 2018-12-20 MED ORDER — NITROGLYCERIN 0.4 MG SL SUBL: 0.4000 mg | SUBLINGUAL_TABLET | SUBLINGUAL | 0 refills | Status: DC | PRN

## 2018-12-20 MED ORDER — SODIUM CHLORIDE 0.9 % IV SOLN
2.0000 g | INTRAVENOUS | Status: DC
  Administered 2018-12-20: 2 g via INTRAVENOUS
  Filled 2018-12-20: qty 2

## 2018-12-20 MED ORDER — LABETALOL HCL 5 MG/ML IV SOLN: 10.0000 mg | INTRAVENOUS | Status: AC | PRN

## 2018-12-20 MED ORDER — MIDAZOLAM HCL 2 MG/2ML IJ SOLN
INTRAMUSCULAR | Status: AC
  Filled 2018-12-20: qty 2

## 2018-12-20 MED ORDER — METOPROLOL SUCCINATE ER 25 MG PO TB24
25.0000 mg | ORAL_TABLET | Freq: Every day | ORAL | Status: DC
  Administered 2018-12-20: 25 mg via ORAL
  Filled 2018-12-20: qty 1

## 2018-12-20 MED ORDER — MIDAZOLAM HCL 2 MG/2ML IJ SOLN
INTRAMUSCULAR | Status: DC | PRN
  Administered 2018-12-20 (×2): 1 mg via INTRAVENOUS

## 2018-12-20 MED ORDER — HEPARIN SODIUM (PORCINE) 1000 UNIT/ML IJ SOLN
INTRAMUSCULAR | Status: DC | PRN
  Administered 2018-12-20: 10000 [IU] via INTRAVENOUS

## 2018-12-20 MED ORDER — SODIUM CHLORIDE 0.9 % WEIGHT BASED INFUSION: 3.0000 mL/kg/h | INTRAVENOUS | Status: DC

## 2018-12-20 MED ORDER — SODIUM CHLORIDE 0.9 % WEIGHT BASED INFUSION
3.0000 mL/kg/h | INTRAVENOUS | Status: DC
  Administered 2018-12-20: 3 mL/kg/h via INTRAVENOUS

## 2018-12-20 MED ORDER — VERAPAMIL HCL 2.5 MG/ML IV SOLN
INTRAVENOUS | Status: DC | PRN
  Administered 2018-12-20: 10 mL via INTRA_ARTERIAL

## 2018-12-20 MED ORDER — LOSARTAN POTASSIUM 25 MG PO TABS: 12.5000 mg | ORAL_TABLET | Freq: Every day | ORAL | 6 refills | Status: DC

## 2018-12-20 MED ORDER — SODIUM CHLORIDE 0.9% FLUSH: 3.0000 mL | Freq: Two times a day (BID) | INTRAVENOUS | Status: DC

## 2018-12-20 MED ORDER — SODIUM CHLORIDE 0.9 % IV SOLN: 250.0000 mL | INTRAVENOUS | Status: DC | PRN

## 2018-12-20 MED ORDER — LIDOCAINE HCL (PF) 1 % IJ SOLN
INTRAMUSCULAR | Status: DC | PRN
  Administered 2018-12-20: 2 mL via INTRADERMAL

## 2018-12-20 MED ORDER — HYDRALAZINE HCL 20 MG/ML IJ SOLN: 10.0000 mg | INTRAMUSCULAR | Status: AC | PRN

## 2018-12-20 MED ORDER — ASPIRIN 81 MG PO CHEW: 81.0000 mg | CHEWABLE_TABLET | Freq: Every day | ORAL | 6 refills | Status: DC

## 2018-12-20 MED ORDER — FENTANYL CITRATE (PF) 100 MCG/2ML IJ SOLN
INTRAMUSCULAR | Status: DC | PRN
  Administered 2018-12-20 (×2): 25 ug via INTRAVENOUS

## 2018-12-20 MED ORDER — LIDOCAINE HCL (PF) 1 % IJ SOLN
INTRAMUSCULAR | Status: AC
  Filled 2018-12-20: qty 30

## 2018-12-20 MED FILL — ASPIRIN LOW DOSE 81 MG CHEW: 81 | 30 days supply | Qty: 30 | Fill #0

## 2018-12-20 MED FILL — BRILINTA 90 MG TABLET: 90 | 30 days supply | Qty: 60 | Fill #0

## 2018-12-20 SURGICAL SUPPLY — 16 items
BALLN SAPPHIRE 2.0X12 (BALLOONS) ×2
BALLN SAPPHIRE ~~LOC~~ 3.25X8 (BALLOONS) ×2 IMPLANT
BALLOON SAPPHIRE 2.0X12 (BALLOONS) ×1 IMPLANT
CATH VISTA GUIDE 6FR JR4 (CATHETERS) ×2 IMPLANT
DEVICE RAD COMP TR BAND LRG (VASCULAR PRODUCTS) ×2 IMPLANT
ELECT DEFIB PAD ADLT CADENCE (PAD) ×2 IMPLANT
GLIDESHEATH SLEND SS 6F .021 (SHEATH) ×2 IMPLANT
GUIDEWIRE INQWIRE 1.5J.035X260 (WIRE) ×1 IMPLANT
INQWIRE 1.5J .035X260CM (WIRE) ×2
KIT ENCORE 26 ADVANTAGE (KITS) ×2 IMPLANT
KIT HEART LEFT (KITS) ×2 IMPLANT
PACK CARDIAC CATHETERIZATION (CUSTOM PROCEDURE TRAY) ×2 IMPLANT
STENT RESOLUTE ONYX 3.0X12 (Permanent Stent) ×2 IMPLANT
TRANSDUCER W/STOPCOCK (MISCELLANEOUS) ×2 IMPLANT
TUBING CIL FLEX 10 FLL-RA (TUBING) ×2 IMPLANT
WIRE COUGAR XT STRL 190CM (WIRE) ×2 IMPLANT

## 2018-12-20 NOTE — Discharge Summary (Addendum)
Discharge Summary    Patient ID: Chelsea Allison MRN: [id number]; DOB: 1955/12/24  Admit date: 12/12/2018 Discharge date: 12/20/2018  Primary Care Provider: Beckie Salts, MD  Primary Cardiologist: Sinclair Grooms, MD  Primary Electrophysiologist:  None   Discharge Diagnoses    Principal Problem:   STEMI (ST elevation myocardial infarction) [facility]) Active Problems:   Acute on chronic respiratory failure with hypercapnia ([place])   Acute combined systolic and diastolic heart failure ([place])   Acute MI, lateral wall ([place])   Acute pulmonary edema ([place])   COPD with acute exacerbation ([place])   Hypoxemia   Acute respiratory failure with hypoxemia ([place])   Unstable angina ([place])   Hyperlipidemia   Hypertension   Pneumonia   Tobacco abuse   CAD (coronary artery disease)   Allergies Allergies  Allergen Reactions   Hydrochlorothiazide Other (See Comments)    headaches   Hydrocodone Nausea And Vomiting   Lisinopril Other (See Comments)    Headache    Oxycodone Nausea Only   Statins Other (See Comments)    Diagnostic Studies/Procedures    Cardiac cath 12/12/18  A stent was successfully placed.    Acute lateral wall myocardial infarction due to thrombotic occlusion of the circumflex.  High-grade obstruction of the proximal RCA with collaterals to the apical LAD  Chronic total occlusion of the proximal LAD.  PTCA and stent implantation proximal to mid circumflex reducing 100% stenosis to less than 10% with TIMI grade III flow.  Final post dual balloon diameter 2.83 mm using A 22 x 2.75 Onyx.  No postdilatation was performed.  Mid anterior wall akinesis with anteroapical hypokinesis.  LVEDP 25 mmHg.  Mean pulmonary capillary wedge pressure 7 mmHg, PA pressure 24 over 12 mmHg.  Intra-aortic balloon pump at 1:1 pumping.  Altered mental status prior to intubation.  Etiology is unclear but likely related to CO2 narcosis.  Rule out intracranial hemorrhage/bleed in the setting  of intense anticoagulation.  RECOMMENDATIONS:   Fluid bolus to increase capillary wedge pressure to 15 mmHg.  IV Levophed to support systolic blood pressure above 100 mmHg.  One-to-one intra-aortic balloon pump counterpulsation.  Aggrastat x18 hours  Load with Brilinta slurry.  Critical care to help manage acute hypoxic/hypercarbic respiratory failure requiring intubation in the Cath Lab (PCO2 was 81 on arterial blood gas prior to intubation).  Preventive therapy including high-dose statin therapy  If the patient has not had an intracranial bleed or CVA.  Will need pulmonary/critical care help with managing ventilator.  Needs a neurology consultation.  This would be requested because the patient's mental status acutely changed in the Cath Lab prior to intubation.   Coronary stent intervention 12/20/18  Prox RCA lesion is 80% stenosed.  A drug-eluting stent was successfully placed using a STENT RESOLUTE ONYX 3.0X12.  Post intervention, there is a 0% residual stenosis.   1. Severe stenosis proximal RCA 2. Successful PTCA/DES x 1 proximal RCA  Continue ASA/Brilinta/statin and beta blocker. DAPT for one year.       Echo 12/12/18 Sonographer Comments: Echo performed with patient supine and on artificial respirator and suboptimal parasternal window. Technically difficult study due to pad on chest and poor patient compliance. Unable to remove pad due to patient condition. IMPRESSIONS  1. Technically difficult study with limited views. Left ventricular ejection fraction, by visual estimation, is 35 to 40%. Hypokinesis of apex, anterior/septal/lateral apical walls, mid to basal inferolateral wall  2. Global right ventricle has normal systolic function.The right ventricular size is normal. FINDINGS  Left Ventricle: Left ventricular ejection fraction, by visual estimation, is 35 to 40%. The left ventricle has moderately decreased function. There is mildly increased left  ventricular hypertrophy. Concentric left ventricular hypertrophy. Spectral  Doppler shows Left ventricular diastolic Doppler parameters are consistent with impaired relaxation pattern of LV diastolic filling.   Limited echo 12/16/18 IMPRESSIONS  1. Left ventricular ejection fraction, by visual estimation, is 40 to 45%. The left ventricle has moderately decreased function. Normal left ventricular size. There is no left ventricular hypertrophy.  2. Entire inferolateral wall is abnormal.  3. Global right ventricle has normal systolic function.The right ventricular size is normal. No increase in right ventricular wall thickness.  4. Left atrial size was normal.  5. Right atrial size was normal.  6. Moderate mitral annular calcification.  7. The mitral valve is normal in structure. Mild mitral valve regurgitation. No evidence of mitral stenosis.  8. The tricuspid valve is normal in structure. Tricuspid valve regurgitation was not visualized by color flow Doppler.  9. The aortic valve is normal in structure. Aortic valve regurgitation was not visualized by color flow Doppler. Structurally normal aortic valve, with no evidence of sclerosis or stenosis. 10. The pulmonic valve was normal in structure. Pulmonic valve regurgitation is not visualized by color flow Doppler. 11. The inferior vena cava is normal in size with greater than 50% respiratory variability, suggesting right atrial pressure of 3 mmHg.  _____________   History of Present Illness     Chelsea Allison is a 63 y.o. 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 The patient had never had chest pain before. Her activity level was low at baseline given her COPD and chronic dyspnea on exertion and neuropathy. Early morning at 3:30AM the patient was awakened by  substernal chest pain. The pain was worse with deep inspiration and was better on expiration. She pain was 8/10 and constant. Pain radiated down her left arm. She denied nausea or vomiting. EMS was called. They administered aspirin. Initial EKG was concerning for an inferior MI so nitro was not used.   Hospital Course     Consultants: Critical care, Neurology  Upon arrival to the ED her chest pain was worse. EKG showed inferior STEMI and the patient was taken emergently to the cath lab left heart cardiac catheterization. Cath showed acute latera; wall MI due to thrombotic occlusion of the circumflex, high grade obstruction of the proximal RCA with collaterals to the apical LAD, chronic total occlusion of the proximal LAD, mid anterior wall akinesis with anteroapical hypokinesis, LVEDP 25 mmHG, mean pulmonary capillary wedge pressure 7 mm HG, PA pressure 24 over 12 mm HG, Intra-aortic balloon pump at 1:1. Patient was treated with PCTA and stent to mid Cx. During the procedure the patient became progressively more hypoxic. IV lasix 40 was given and high-flow oxygen was administered. Within 10 minutes the patient became unresponsive and had to be intubated. Etiology was unclear, but thought to be due to CO2 narcosis. Because of hypoxia and elevated LV and diastolic pressure of 28 mm HG an intra-aortic balloon pump was inserted and a right heart catheter was placed. Both procedures were done using right femoral access. The patient received an aggrastat fluid bolus (x18 hours) and IV levophed. Critical care/pulmonology was consulted for hypoxic/hypercarbic respiratory failure and ventilator management. Neurology was consulted for acute mental  status change. Stat CT head showed no acute evidence of acute bleed or evolving infarct. Echo revealed EF 35-40%, hypokinesis of apex, anterior/septal/lateral apical walls, mid to basal inferolateral wall, impaired relaxation diastolic filling pattern. Patient slowly began to  improve. The right groin cath site without complications. Patient was loaded with 180 mg with plan for 90 mg BID. IV heparin was not resumed. IABP was removed. Peak HS troponin was noted at >27,000. LDL came back elevated at 172 and patient was started on Crestor. Patient was extubated the next day. Bisoprolol was started in place of coreg. Later that day the patient woke up from a nap and was complaining of severe SOB. Patient was started on bipap. CXR showed acute pneumonia and she was started on Vanc/Zosyn. She was given IV lasix 40 mg and also treated with solumedrol for possible COPD exacerbation. Procalcitonin was normal. Blood cultures were ordered. Patient was briefly taken off bipap but was restarted for hypoxia after working with cardiac rehab. Patient was re-intubated for respiratory distress and bronchospasm. Lactic acid came back at 2.2. Limited echo was obtained showing improved EF 40-45%. 2 days later the patient was extubated and started on CPAP. Abx were switched to ceftriaxone. 2 runs of 14 beat NSVT were noted. Patient was euvolemic. Patient slowly weaned off CPAP to room air. Low dose metoprolol was started in place of bisoprolol. Losartan 25 mg was started. Patient slowly improved. She was taken back to the cath lab for PCI of the RCA. Patient remained stable throughout the procedure. Post cath right write cath site remained clean and dry with no signs of hematoma. Creatinine was 0.97 and Hgb 14.5. Plan for DAPT with Brilinta and ASA for 12 months. Crestor 10 mg (low dose started due to intolerance), Losartan 25 mg, and Metoprolol 25 mg mg QD was sent in to her pharmacy. Patient was counseled on tobacco cessation.   Patient was seen and examined by Dr. Irish Lack on 12/20/18 and felt to be stable for discharge. Hospital follow up was arranged.   Did the patient have an acute coronary syndrome (MI, NSTEMI, STEMI, etc) this admission?:  Yes                               AHA/ACC Clinical  Performance & Quality Measures: 1. Aspirin prescribed? - Yes 2. ADP Receptor Inhibitor (Plavix/Clopidogrel, Brilinta/Ticagrelor or Effient/Prasugrel) prescribed (includes medically managed patients)? - Yes 3. Beta Blocker prescribed? - Yes 4. High Intensity Statin (Lipitor 40-80mg  or Crestor 20-40mg ) prescribed? - No due to statin intolerance 5. EF assessed during THIS hospitalization? - Yes 6. For EF <40%, was ACEI/ARB prescribed? - Yes 7. For EF <40%, Aldosterone Antagonist (Spironolactone or Eplerenone) prescribed? - No - Reason:  EF improved 8. Cardiac Rehab Phase II ordered (Included Medically managed Patients)? - Yes   _____________  Discharge Vitals Blood pressure (!) 145/92, pulse 81, temperature 98.4 F (36.9 C), temperature source Oral, resp. rate (!) 38, height 5\' 8"  (1.727 m), weight 84.4 kg, SpO2 99 %.  Filed Weights   12/18/18 0500 12/19/18 0500 12/20/18 0434  Weight: 88.3 kg 86.4 kg 84.4 kg    Labs & Radiologic Studies    CBC Recent Labs    12/19/18 0911 12/20/18 0932  WBC 14.0* 11.5*  HGB 13.7 14.5  HCT 45.6 45.2  MCV 90.1 87.3  PLT 290 381   Basic Metabolic Panel Recent Labs    12/19/18 0911  NA 138  K 3.6  CL 102  CO2 27  GLUCOSE 83  BUN 34*  CREATININE 0.97  CALCIUM 9.3   Liver Function Tests No results for input(s): AST, ALT, ALKPHOS, BILITOT, PROT, ALBUMIN in the last 72 hours. No results for input(s): LIPASE, AMYLASE in the last 72 hours. High Sensitivity Troponin:   Recent Labs  Lab 12/12/18 0657 12/12/18 1132  TROPONINIHS 65* >27,000*    BNP Invalid input(s): POCBNP D-Dimer No results for input(s): DDIMER in the last 72 hours. Hemoglobin A1C No results for input(s): HGBA1C in the last 72 hours. Fasting Lipid Panel No results for input(s): CHOL, HDL, LDLCALC, TRIG, CHOLHDL, LDLDIRECT in the last 72 hours. Thyroid Function Tests No results for input(s): TSH, T4TOTAL, T3FREE, THYROIDAB in the last 72 hours.  Invalid input(s):  FREET3 _____________  [place] 1 View  Result Date: 12/12/2018 CLINICAL DATA:  OG tube placement. EXAM: ABDOMEN - 1 VIEW COMPARISON:  None. FINDINGS: An OG tube is noted with tip overlying the mid stomach. IMPRESSION: OG tube with tip overlying the mid stomach. Electronically Signed   By: Margarette Canada M.D.   On: 12/12/2018 19:13   Ct Head Wo Contrast  Result Date: 12/12/2018 CLINICAL DATA:  63 year old female with altered mental status. Presented with acute myocardial infarction and cardiac catheterization. EXAM: CT HEAD WITHOUT CONTRAST TECHNIQUE: Contiguous axial images were obtained from the base of the skull through the vertex without intravenous contrast. COMPARISON:  01/08/2017 MR FINDINGS: Brain: No evidence of acute infarction, hemorrhage, hydrocephalus, extra-axial collection or mass lesion/mass effect. Very mild chronic small-vessel white matter ischemic changes again noted. Vascular: Carotid atherosclerotic calcifications noted. Skull: Normal. Negative for fracture or focal lesion. Sinuses/Orbits: No acute abnormality. Chronic mucosal thickening within sphenoid sinuses noted. Other: None IMPRESSION: 1. No evidence of acute intracranial abnormality. 2. Mild chronic small-vessel white matter ischemic changes. Electronically Signed   By: Margarette Canada M.D.   On: 12/12/2018 14:56   Dg Chest Port 1 View  Result Date: 12/17/2018 CLINICAL DATA:  63 year old female with acute respiratory failure. EXAM: PORTABLE CHEST 1 VIEW COMPARISON:  Prior chest x-ray 17 Jul 2026 FINDINGS: The tip of the endotracheal tube is 2.1 cm above the carina. Left IJ central venous catheter remains in place. However, the tip is changed location and is now elective within the azygos vein. Gastric tube remains present. The tip is not identified but lies below the field of view. Stable cardiac and mediastinal contours. Atherosclerotic calcifications again noted in the transverse aorta. Post radiation changes in the medial right  upper lobe with associated peaking of the right hemidiaphragm. No new background airspace opacity, pulmonary edema, pleural effusion or pneumothorax. No acute osseous abnormality. IMPRESSION: 1. The tip of the left IJ approach central venous catheter has migrated the azygos vein. 2. Other support apparatus in satisfactory position. 3. Otherwise, stable appearance of the chest.  No acute findings. Electronically Signed   By: Jacqulynn Cadet M.D.   On: 12/17/2018 09:48   Dg Chest Port 1 View  Result Date: 12/15/2018 CLINICAL DATA:  Central line placement. EXAM: PORTABLE CHEST 1 VIEW COMPARISON:  Earlier same day as well as 12/13/2018 and 12/12/2018 FINDINGS: Endotracheal tube has tip 4.6 cm from the carina. Interval placement of left IJ central venous catheter with tip over the SVC. Lungs are adequately inflated with stable chronic volume loss of the right lung. No pneumothorax. Persistent airspace opacification over the right mid to upper lung likely infection. Postsurgical/post radiation changes over the right paramediastinal region.  Remainder of the exam is unchanged. IMPRESSION: Stable hazy airspace process over the right mid to upper lung likely infection. Chronic postsurgical/post radiation change and volume loss of the right lung. Tubes and lines as described.  No pneumothorax. Electronically Signed   By: Marin Olp M.D.   On: 12/15/2018 19:43   Portable Chest X-ray  Result Date: 12/15/2018 CLINICAL DATA:  Endotracheal tube placement. EXAM: PORTABLE CHEST 1 VIEW COMPARISON:  12/15/2018 earlier. FINDINGS: Interval placement of endotracheal tube with tip 4.6 cm above the carina. Persistent hazy airspace opacification over the right mid to upper lung likely infection. Postsurgical/post radiation change over the right paramediastinal region with stable volume loss of the right lung. No effusion. Cardiomediastinal silhouette and remainder of the exam is unchanged. IMPRESSION: Persistent airspace process  over the right mid to upper lung likely infection. Endotracheal tube with tip 4.6 cm above the carina. Electronically Signed   By: Marin Olp M.D.   On: 12/15/2018 18:59   Dg Chest Port 1 View  Result Date: 12/15/2018 CLINICAL DATA:  Shortness of breath. Known history of right lung cancer. EXAM: PORTABLE CHEST 1 VIEW COMPARISON:  12/13/2018 FINDINGS: Lungs are adequately inflated and demonstrate slight worsening airspace opacification over the right mid to upper lung likely infection. No evidence of effusion. Postsurgical/radiation change over the right paramediastinal region. Cardiomediastinal silhouette and remainder the exam is unchanged. IMPRESSION: Worsening airspace process over the right mid to upper lung likely infection. Electronically Signed   By: Marin Olp M.D.   On: 12/15/2018 18:57   Dg Chest Port 1 View  Result Date: 12/13/2018 CLINICAL DATA:  63 year old with acute hypoxia. EXAM: PORTABLE CHEST 1 VIEW 9:01 p.m.: COMPARISON:  Portable chest x-ray earlier same day at 5 o'clock a.m. and previously. FINDINGS: Extubation and removal of the Swan-Ganz catheter since the examination this morning. Interval development of airspace opacities in the RIGHT UPPER LOBE. Pleuroparenchymal scarring on the RIGHT with tethering of the RIGHT hemidiaphragm, unchanged. Pulmonary venous hypertension and perhaps mild interstitial pulmonary edema. Cardiac silhouette normal in size for AP portable technique. IMPRESSION: 1. Acute pneumonia in the RIGHT UPPER LOBE since the examination this morning, possibly aspiration. 2. Pulmonary venous hypertension and perhaps mild interstitial pulmonary edema, query fluid overload. Electronically Signed   By: Evangeline Dakin M.D.   On: 12/13/2018 21:09   Dg Chest Port 1 View  Result Date: 12/13/2018 CLINICAL DATA:  Endotracheal tube EXAM: PORTABLE CHEST 1 VIEW COMPARISON:  Yesterday FINDINGS: Endotracheal tube tip between the clavicular heads and carina. The orogastric  tube at least reaches the stomach. Pulmonary artery catheter from below with tip overlapping the main pulmonary artery. An aortic balloon pump marker is not seen today. Right perihilar architectural distortion and increased density with radiation fibrosis appearance on 2019 CT. There is also been a PET-CT 06/27/2018, images not currently available. There is no edema, consolidation, effusion, or pneumothorax. IMPRESSION: 1. Unremarkable hardware positioning. 2. Stable aeration. Electronically Signed   By: Monte Fantasia M.D.   On: 12/13/2018 07:54   Dg Chest Port 1 View  Result Date: 12/12/2018 CLINICAL DATA:  Chest pain. History of STEMI December 12, 2018. History of colon cancer and lung cancer. EXAM: PORTABLE CHEST 1 VIEW COMPARISON:  November 18, 2016 FINDINGS: Fibrotic changes are seen in the right suprahilar region, more masslike in appearance compared to November 18, 2016. Pleural thickening is seen in the right apex, likely post therapeutic. Mild opacity is seen in the right mid lung, peripherally. Mild increased interstitial  markings in the upper lobes. The heart, left hilum, and mediastinum are unchanged. An ETT is in good position. An intra aortic balloon pump terminates 7.5 cm below the thoracic aortic arch. The distal tip of a PA catheter from an inferior approach projects over the left hilum. IMPRESSION: 1. The ETT is in good position. 2. An intra-aortic balloon pump appears to have been placed, terminating 7.5 cm below the top of the aortic arch. 3. The PA catheter approaches via the inferior vena cava with the distal tip projected over the proximal outflow tract. 4. Increased interstitial markings in the apices may represent mild pulmonary venous congestion. 5. Increasing masslike opacity in the right suprahilar region. Fibrotic change was seen in this region November 18, 2016. The increased fullness could represent recurrence or evolution of postradiation change. 6. More focal opacity is seen  in the periphery of the right mid lung, possibly infectious. Electronically Signed   By: Dorise Bullion III M.D   On: 12/12/2018 11:25   Dg Abd Portable 1v  Result Date: 12/15/2018 CLINICAL DATA:  Enteric tube placement. EXAM: PORTABLE ABDOMEN - 1 VIEW COMPARISON:  Chest x-ray from same day. FINDINGS: Enteric tube in the mid stomach. Visualized bowel gas pattern is normal. IMPRESSION: Enteric tube in the stomach. Electronically Signed   By: Titus Dubin M.D.   On: 12/15/2018 21:58   Disposition   Pt is being discharged home today in good condition.  Follow-up Plans & Appointments    Follow-up Information    Burtis Junes, NP Follow up on 01/03/2019.   Specialties: Nurse Practitioner, Interventional Cardiology, Cardiology, Radiology Why: Please go to hospital follow up October 27th at 10:00 AM Contact information: [address]  [place] [phone]          Discharge Instructions    Amb Referral to Cardiac Rehabilitation   Complete by: As directed    To [place]   Diagnosis:  Coronary Stents STEMI PTCA     After initial evaluation and assessments completed: Virtual Based Care may be provided alone or in conjunction with Phase 2 Cardiac Rehab based on patient barriers.: Yes   Call MD for:  difficulty breathing, headache or visual disturbances   Complete by: As directed    Call MD for:  persistant nausea and vomiting   Complete by: As directed    Call MD for:  redness, tenderness, or signs of infection (pain, swelling, redness, odor or green/yellow discharge around incision site)   Complete by: As directed    Call MD for:  severe uncontrolled pain   Complete by: As directed    Diet - low sodium heart healthy   Complete by: As directed    Discharge instructions   Complete by: As directed    No driving for 2 weeks. No lifting over 10 lbs for 2 weeks. No sexual activity for 2 weeks. You may return to work when cleared by your  cardiologist. Keep procedure site clean & dry. If you notice increased pain, swelling, bleeding or pus, call/return!  You may shower, but no soaking baths/hot tubs/pools for 1 week.   Face-to-face encounter (required for Medicare/Medicaid patients)   Complete by: As directed    I Cadence H Furth certify that this patient is under my care and that I, or a nurse practitioner or physician's assistant working with me, had a face-to-face encounter that meets the physician face-to-face encounter requirements with this patient on 12/20/2018. The encounter with the patient  was in whole, or in part for the following medical condition(s) which is the primary reason for home health care (List medical condition): CAD, deconditioning   The encounter with the patient was in whole, or in part, for the following medical condition, which is the primary reason for home health care: CAD, deconditioning   I certify that, based on my findings, the following services are medically necessary home health services: Physical therapy   Reason for Medically Necessary Home Health Services: Therapy- Personnel officer, Public librarian   My clinical findings support the need for the above services: OTHER SEE COMMENTS   Further, I certify that my clinical findings support that this patient is homebound due to: Shortness of Breath with activity   Home Health   Complete by: As directed    To provide the following care/treatments: PT   Increase activity slowly   Complete by: As directed    Shower chair   Complete by: As directed    Walker rolling   Complete by: As directed       Discharge Medications   Allergies as of 12/20/2018      Reactions   Hydrochlorothiazide Other (See Comments)   headaches   Hydrocodone Nausea And Vomiting   Lisinopril Other (See Comments)   Headache   Oxycodone Nausea Only   Statins Other (See Comments)      Medication List    STOP taking these medications   amLODipine 5  MG tablet Commonly known as: NORVASC   vitamin B-12 1000 MCG tablet Commonly known as: CYANOCOBALAMIN     TAKE these medications   ALPRAZolam 0.5 MG tablet Commonly known as: XANAX Take 0.5 mg by mouth 2 (two) times daily as needed for anxiety.   aspirin 81 MG chewable tablet Chew 1 tablet (81 mg total) by mouth daily. Start taking on: December 21, 2018   Breo Ellipta 200-25 MCG/INH Aepb Generic drug: fluticasone furoate-vilanterol Inhale 1 puff into the lungs daily.   dicyclomine 10 MG capsule Commonly known as: BENTYL Take 10 mg by mouth 3 (three) times daily as needed for cramping.   gabapentin 300 MG capsule Commonly known as: NEURONTIN Take 300 mg by mouth 3 (three) times daily.   ipratropium-albuterol 0.5-2.5 (3) MG/3ML Soln Commonly known as: DUONEB Inhale 3 mLs into the lungs every 4 (four) hours as needed for shortness of breath or wheezing.   losartan 25 MG tablet Commonly known as: COZAAR Take 0.5 tablets (12.5 mg total) by mouth daily.   metoprolol succinate 25 MG 24 hr tablet Commonly known as: TOPROL-XL Take 1 tablet (25 mg total) by mouth daily.   nitroGLYCERIN 0.4 MG SL tablet Commonly known as: NITROSTAT Place 1 tablet (0.4 mg total) under the tongue every 5 (five) minutes x 3 doses as needed for chest pain.   rosuvastatin 10 MG tablet Commonly known as: CRESTOR Take 1 tablet (10 mg total) by mouth daily at 6 PM.   ticagrelor 90 MG Tabs tablet Commonly known as: BRILINTA Take 1 tablet (90 mg total) by mouth 2 (two) times daily.   traMADol 50 MG tablet Commonly known as: ULTRAM Take 100 mg by mouth every 6 (six) hours as needed for moderate pain.   Vitamin D (Ergocalciferol) 1.25 MG (50000 UT) Caps capsule Commonly known as: DRISDOL Take 50,000 Units by mouth every 7 (seven) days.          Outstanding Labs/Studies   None  Duration of Discharge Encounter   Greater  than 30 minutes including physician time.  Signed, Larae Grooms, MD 12/20/2018, 2:39 PM   I have examined the patient and reviewed assessment and plan and discussed with patient.  Agree with above as stated.   VSS.  Respiratory status stable. S/p successful RCA PCI.  Plan for discharge later today. Will continue aggressive secondary prevention including DAPT.  Only using low dose Crestor since she has an intolerance.   I stress smoking cessation.   Medical management of LAD CTO.  If she had recurrent sx, could consider CTO PCI of the LAD.  For now, aggressive medical therapy.  Larae Grooms

## 2018-12-20 NOTE — Interval H&P Note (Signed)
History and Physical Interval Note:  12/20/2018 7:59 AM  Chelsea Allison  has presented today for cardiac cath with the diagnosis of CAD with unstable angina.  The various methods of treatment have been discussed with the patient and family. After consideration of risks, benefits and other options for treatment, the patient has consented to  Procedure(s): CORONARY STENT INTERVENTION - Right (N/A) as a surgical intervention.  The patient's history has been reviewed, patient examined, no change in status, stable for surgery.  I have reviewed the patient's chart and labs.  Questions were answered to the patient's satisfaction.    Cath Lab Visit (complete for each Cath Lab visit)  Clinical Evaluation Leading to the Procedure:   ACS: Yes.    Non-ACS:    Anginal Classification: CCS III  Anti-ischemic medical therapy: Minimal Therapy (1 class of medications)  Non-Invasive Test Results: No non-invasive testing performed  Prior CABG: No previous CABG        Chelsea Allison

## 2018-12-20 NOTE — Discharge Instructions (Addendum)
TONIGHT ONLY TAKE BRILINTA (TICAGRELOR) AND GABAPENTIN (NEURONTIN) CRESTOR (ROSUVASTATIN) WAS GIVEN AT [place] AS WRITTEN

## 2018-12-20 NOTE — Progress Notes (Addendum)
Progress Note  Patient Name: Chelsea Allison Date of Encounter: 12/20/2018  Primary Cardiologist: Sinclair Grooms, MD   Subjective   Patient feeling better this morning. Denies chest pain or shortness of breath. Patient ambulating without symptoms. Plan to take patient back to the cath lab today.  Inpatient Medications    Scheduled Meds: . aspirin  81 mg Oral Daily  . chlorhexidine gluconate (MEDLINE KIT)  15 mL Mouth Rinse BID  . Chlorhexidine Gluconate Cloth  6 each Topical Daily  . fluticasone furoate-vilanterol  1 puff Inhalation Daily  . heparin  5,000 Units Subcutaneous Q8H  . losartan  12.5 mg Oral Daily  . mouth rinse  15 mL Mouth Rinse BID  . metoprolol tartrate  12.5 mg Oral BID  . pantoprazole  40 mg Oral Daily  . Ensure Max Protein  11 oz Oral Daily  . rosuvastatin  10 mg Oral q1800  . sodium chloride flush  3 mL Intravenous Q12H  . sodium chloride flush  3 mL Intravenous Q12H  . ticagrelor  90 mg Oral BID   Continuous Infusions: . sodium chloride 10 mL/hr at 12/15/18 2010  . sodium chloride    . sodium chloride 1 mL/kg/hr (12/20/18 0254)  . cefTRIAXone (ROCEPHIN)  IV 2 g (12/19/18 2159)   PRN Meds: sodium chloride, acetaminophen, ALPRAZolam, bisacodyl, docusate sodium, ipratropium-albuterol, nitroGLYCERIN, ondansetron (ZOFRAN) IV, senna, sodium chloride flush, sodium chloride flush, sodium chloride flush, traMADol, zolpidem   Vital Signs    Vitals:   12/19/18 1200 12/19/18 1540 12/19/18 2030 12/20/18 0434  BP: 110/69  114/71   Pulse: 69  75   Resp: 18  20   Temp:  98.4 F (36.9 C) 98.4 F (36.9 C)   TempSrc:  Oral Oral   SpO2: 97%  93%   Weight:    84.4 kg  Height:        Intake/Output Summary (Last 24 hours) at 12/20/2018 0653 Last data filed at 12/20/2018 0437 Gross per 24 hour  Intake 379 ml  Output 1400 ml  Net -1021 ml   Last 3 Weights 12/20/2018 12/19/2018 12/18/2018  Weight (lbs) 186 lb 190 lb 7.6 oz 194 lb 10.7 oz  Weight (kg)  84.369 kg 86.4 kg 88.3 kg      Telemetry    NSR, HR 70s, no other arrhythmias noted - Personally Reviewed  ECG    No new - Personally Reviewed  Physical Exam   GEN: No acute distress.   Neck: No JVD Cardiac: RRR, no murmurs, rubs, or gallops.  Respiratory: Course breath sounds bilaterally. GI: Soft, nontender, non-distended  MS: No edema; No deformity. Neuro:  Nonfocal  Psych: Normal affect   Labs    High Sensitivity Troponin:   Recent Labs  Lab 12/12/18 0657 12/12/18 1132  TROPONINIHS 65* >27,000*      Chemistry Recent Labs  Lab 12/16/18 0505 12/17/18 0346 12/19/18 0911  NA 141 144 138  K 3.4* 3.8 3.6  CL 101 107 102  CO2 '26 26 27  ' GLUCOSE 184* 149* 83  BUN 39* 39* 34*  CREATININE 1.13* 0.95 0.97  CALCIUM 9.0 9.4 9.3  GFRNONAA 52* >60 >60  GFRAA >60 >60 >60  ANIONGAP '14 11 9     ' Hematology Recent Labs  Lab 12/16/18 0505 12/17/18 0346 12/19/18 0911  WBC 13.1* 10.4 14.0*  RBC 4.85 4.65 5.06  HGB 13.5 12.8 13.7  HCT 43.8 42.3 45.6  MCV 90.3 91.0 90.1  MCH 27.8 27.5 27.1  MCHC 30.8 30.3 30.0  RDW 14.3 14.4 14.1  PLT 246 224 290    BNPNo results for input(s): BNP, PROBNP in the last 168 hours.   DDimer No results for input(s): DDIMER in the last 168 hours.   Radiology    No results found.  Cardiac Studies   Limited echo 12/16/18 1. Left ventricular ejection fraction, by visual estimation, is 40 to 45%. The left ventricle has moderately decreased function. Normal left ventricular size. There is no left ventricular hypertrophy. 2. Entire inferolateral wall is abnormal. 3. Global right ventricle has normal systolic function.The right ventricular size is normal. No increase in right ventricular wall thickness. 4. Left atrial size was normal. 5. Right atrial size was normal. 6. Moderate mitral annular calcification. 7. The mitral valve is normal in structure. Mild mitral valve regurgitation. No evidence of mitral stenosis. 8. The  tricuspid valve is normal in structure. Tricuspid valve regurgitation was not visualized by color flow Doppler. 9. The aortic valve is normal in structure. Aortic valve regurgitation was not visualized by color flow Doppler. Structurally normal aortic valve, with no evidence of sclerosis or stenosis. 10. The pulmonic valve was normal in structure. Pulmonic valve regurgitation is not visualized by color flow Doppler. 11. The inferior vena cava is normal in size with greater than 50% respiratory variability, suggesting right atrial pressure of 3 mmHg.  Cardiac Cath 12/12/18  A stent was successfully placed.    Acute lateral wall myocardial infarction due to thrombotic occlusion of the circumflex.  High-grade obstruction of the proximal RCA with collaterals to the apical LAD  Chronic total occlusion of the proximal LAD.  PTCA and stent implantation proximal to mid circumflex reducing 100% stenosis to less than 10% with TIMI grade III flow.  Final post dual balloon diameter 2.83 mm using A 22 x 2.75 Onyx.  No postdilatation was performed.  Mid anterior wall akinesis with anteroapical hypokinesis.  LVEDP 25 mmHg.  Mean pulmonary capillary wedge pressure 7 mmHg, PA pressure 24 over 12 mmHg.  Intra-aortic balloon pump at 1:1 pumping.  Altered mental status prior to intubation.  Etiology is unclear but likely related to CO2 narcosis.  Rule out intracranial hemorrhage/bleed in the setting of intense anticoagulation.  RECOMMENDATIONS:   Fluid bolus to increase capillary wedge pressure to 15 mmHg.  IV Levophed to support systolic blood pressure above 100 mmHg.  One-to-one intra-aortic balloon pump counterpulsation.  Aggrastat x18 hours  Load with Brilinta slurry.  Critical care to help manage acute hypoxic/hypercarbic respiratory failure requiring intubation in the Cath Lab (PCO2 was 81 on arterial blood gas prior to intubation).  Preventive therapy including high-dose statin therapy   If the patient has not had an intracranial bleed or CVA.  Will need pulmonary/critical care help with managing ventilator.  Needs a neurology consultation.  This would be requested because the patient's mental status acutely changed in the Cath Lab prior to intubation.    Patient Profile     63 y.o. female with medical history significant for COPD, lung cancer status post pneumonectomy, tobacco use disorder, colon cancer with colostomy who presented to [facility] ED on December 12, 2018 with chest pain found to have STEMI found to have high-grade obstruction of proximal RCA status post PTCA and stent implantation of proximal to mid circumflex. During her coronary intervention, she developed acute encephalopathy and hypercarbia with PCO2 of 81 which warranted emergent intubation. Due to hypoxia and elevated left ventricular end-diastolic pressure of 28 mm per mercury, and  intra-aortic balloon pump was inserted. Extubated 10/10.  Assessment & Plan    Acute Lateral STEMI/CAD - Patient came in 12/12/18 for chest pain found to have STEMI. Peak troponin >27,000 - Cath 10/5 PCI DES to mid Cx with residual high grade obstruction of the proximal RCA. Patient had to be intubated during cath for possible CO2 narcosis. Extubated 10/6. Intubated again 10/8-10/10. Was on CPAP>> now on RA.  - Patient has been improving - Plan for PCI of RCA today - Continue ASA and Brilinta - Continue BB and ARB - Crestor started this admission - Creatinine yesterday 0.97. AM labs pending - Further recommendations per cath  Acute Systolic HF (EF 83-07%) - Echo 10/5 EF 35-40% and hypokinesis of apex, anterior/septal/lateral apical walls - Limited Echo 10/9 showed inferior wall severe HK, EF 40-45% - Bisoprolol was held and low dose metoprolol 12.62m BID was started - Losartan started this admission - Euvolemic on exam>> not on diuretics - Continue BB and Losartan  Acute Respiratory Failure - Multifactorial due  to ?COPD, SIR/PNA, CHF - Now extubated since 10/10 - Was on CPAP >> now on RA - Denies shortness of breath  Tobacco abuse - recommend cessation  Hyperlipidemia - LDL 172 12/13/18 - Crestor 10 mg started this admission - Will need f/u labs outpatient  Hypertension - Pressures stable - Metoprolol 12.5 mg BID and Losartan 12.5 mg daily  H/p of lung cancer s/p partial right lung resection  H/o of Colon CA s/p colostomy  For questions or updates, please contact [facility] Please consult [email or web address] for contact info under        Signed, Cadence HNinfa Meeker PA-C  12/20/2018, 6:53 AM    I have examined the patient and reviewed assessment and plan and discussed with patient.  Agree with above as stated.  S/p successful RCA PCI.  Plan for discharge later today. Will continue aggressive secondary prevention including DAPT.  Only using low dose Crestor since she has an intolerance.   JLarae Grooms

## 2018-12-20 NOTE — Care Management Important Message (Signed)
Important Message  Patient Details  Name: Chelsea Allison MRN: [id number] Date of Birth: [birth year]   Medicare Important Message Given:  Yes     Shelda Altes 12/20/2018, 1:00 PM

## 2018-12-20 NOTE — TOC Transition Note (Signed)
Transition of Care [facility]) - CM/SW Discharge Note   Patient Details  Name: Chelsea Allison MRN: [id number] Date of Birth: 02-25-1956  Transition of [facility]) CM/SW Contact:  Bethena Roys, RN Phone Number: 12/20/2018, 3:16 PM   Clinical Narrative: Pt has opted to return home with [facility]. Medicare.gov list provided to patient and patient chose [facility] for [place]. Referral made to [place] and [facility] to begin within 24-48 hours post transition home. DME RW and 3n1 to be delivered to room. Patient states she has family support that can check in on her. Social worker from [place] can assist with possible personal care services via the patients Medicaid. Patient states she is able to get medications appropriately and can get to PCP. Family at the bedside for transport home. No further needs from CM at this time.        Final next level of care: [place] Barriers to Discharge: No Barriers Identified   Patient Goals and CMS Choice Patient states their goals for this hospitalization and ongoing recovery are:: "to return home" CMS Medicare.gov Compare Post Acute Care list provided to:: Patient Choice offered to / list presented to : Patient   Discharge Plan and Services In-house Referral: Clinical Social Work Discharge Planning Services: CM Consult Post Acute Care Choice: Home Health          DME Arranged: 3-N-1, Walker rolling DME Agency: [employer] Date DME Agency Contacted: 12/20/18 Time DME Agency Contacted: [phone] Representative spoke with at DME Agency: [place]: PT, Social Work, Nurse's Aide [place]: [facility] [employer] [facility] [employer] Date [place]: 12/20/18 Time [place]: 84 Representative spoke with at [place]: [place] ([place]) Interventions     Readmission Risk Interventions Readmission Risk Prevention Plan 12/20/2018 12/20/2018  Transportation  Screening - Complete  PCP or Specialist Appt within 5-7 Days Complete -  Home Care Screening - Complete  Medication Review (RN CM) - Complete  Some recent data might be hidden

## 2018-12-20 NOTE — Progress Notes (Signed)
Physical Therapy Treatment Patient Details Name: Chelsea Allison MRN: [id number] DOB: [birth year] Today's Date: 12/20/2018    History of Present Illness Pt is a 63 yo female admitted with MI and then decompensated in the cath lab requiring intubation 10/5-10/6. Overnight on 10/6 she had increased WOB requiring BiPAP and CXR showed a new RUL infiltrate. PMH: severe COPD, lung cancer s/p partial R lung resection, colon cancer with nephrostomy, active smoker, HTN.  Patient re-intubated 10/8-10/10.    PT Comments    Pt making good progress with mobility. Pt to return home today with intermittent assist.    Follow Up Recommendations  Home health PT;Supervision - Intermittent(HH aide)     Equipment Recommendations  Rolling walker with 5" wheels;3in1 (PT)    Recommendations for Other Services       Precautions / Restrictions Precautions Precautions: Fall Restrictions Weight Bearing Restrictions: No    Mobility  Bed Mobility Overal bed mobility: Modified Independent Bed Mobility: Supine to Sit     Supine to sit: Modified independent (Device/Increase time);HOB elevated     General bed mobility comments: Incr time  Transfers Overall transfer level: Modified independent Equipment used: None Transfers: Sit to/from Stand Sit to Stand: Modified independent (Device/Increase time)            Ambulation/Gait Ambulation/Gait assistance: Supervision;Modified independent (Device/Increase time) Gait Distance (Feet): 225 Feet Assistive device: None Gait Pattern/deviations: Step-through pattern;Decreased stride length;Trunk flexed Gait velocity: decr Gait velocity interpretation: 1.31 - 2.62 ft/sec, indicative of limited community ambulator General Gait Details: supervision with amb in hallway. Did not use rolling walker due to radial cath several hours ago   Marine scientist [place] (Stroke Patients Only)       Balance Overall  balance assessment: Needs assistance Sitting-balance support: Feet supported;No upper extremity supported Sitting balance-Leahy Scale: Good     Standing balance support: No upper extremity supported Standing balance-Leahy Scale: Good                              Cognition Arousal/Alertness: Awake/alert Behavior During Therapy: WFL for tasks assessed/performed Overall Cognitive Status: Within Functional Limits for tasks assessed                                        Exercises      General Comments        Pertinent Vitals/Pain Pain Assessment: Faces Faces Pain Scale: Hurts little more Pain Location: Generalized Pain Descriptors / Indicators: Sore Pain Intervention(s): Limited activity within patient's tolerance;Monitored during session;Patient requesting pain meds-RN notified    Home Living                      Prior Function            PT Goals (current goals can now be found in the care plan section) Acute Rehab PT Goals Patient Stated Goal: return home Progress towards PT goals: Progressing toward goals    Frequency    Min 3X/week      PT Plan Current plan remains appropriate    Co-evaluation              AM-PAC PT "6 Clicks" Mobility   Outcome Measure  Help needed turning from your back to your side  while in a flat bed without using bedrails?: None Help needed moving from lying on your back to sitting on the side of a flat bed without using bedrails?: None Help needed moving to and from a bed to a chair (including a wheelchair)?: None Help needed standing up from a chair using your arms (e.g., wheelchair or bedside chair)?: None Help needed to walk in hospital room?: None Help needed climbing 3-5 steps with a railing? : A Little 6 Click Score: 23    End of Session   Activity Tolerance: Patient tolerated treatment well Patient left: in bed;with call bell/phone within reach;with family/visitor  present Nurse Communication: Patient requests pain meds PT Visit Diagnosis: Other abnormalities of gait and mobility (R26.89);Muscle weakness (generalized) (M62.81)     Time: 6728-9791 PT Time Calculation (min) (ACUTE ONLY): 17 min  Charges:  $Gait Training: 8-22 mins                     [place] Pager [phone] Office [place] 12/20/2018, 2:25 PM

## 2018-12-20 NOTE — Progress Notes (Signed)
Reviewed with pt and friend Brilinta, restrictions, smoking cessation, walking and CRPII. Voiced understanding. Unfortunately sounds like stent card was thrown away. Seems irritable. PT to see to reassure her home needs.  [place], ACSM 1:54 PM 12/20/2018

## 2018-12-28 NOTE — Progress Notes (Deleted)
CARDIOLOGY OFFICE NOTE  Date:  12/28/2018    Lenox Ponds Date of Birth: 1955-03-14 Medical Record #[id number]  PCP:  Beckie Salts, MD  Cardiologist:  Zack Seal chief complaint on file.   History of Present Illness: Chelsea Allison is a 63 y.o. female who presents today for a post hospital visit. Seen for Dr. Tamala Julian (NEW).  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   She presented to the [facility] ED on December 12, 2018 with chest pain. The patient had never had chest pain before. Her activity level was low at baseline given her COPD and chronic dyspnea on exertion and neuropathy. Early morning at 3:30AM the patient was awakened by substernal chest pain. EMS was called.    EKG showed inferior STEMI and the patient was taken emergently to the cath lab left heart cardiac catheterization. Cath showed acute latera; wall MI due to thrombotic occlusion of the circumflex, high grade obstruction of the proximal RCA with collaterals to the apical LAD, chronic total occlusion of the proximal LAD, mid anterior wall akinesis with anteroapical hypokinesis, LVEDP 25 mmHG, mean pulmonary capillary wedge pressure 7 mm HG, PA pressure 24 over 12 mm HG. She was placed on IABP therapy. She had PCTA and stent to mid Cx. During the procedure the patient became progressively more hypoxic. IV lasix 40 was given and high-flow oxygen was administered. Within 10 minutes the patient became unresponsive and had to be intubated. Etiology was unclear, but thought to be due to CO2 narcosis. She had right heart cath as well. She required pressors. Critical care/pulmonology was consulted for hypoxic/hypercarbic respiratory failure and ventilator management. Neurology was consulted for acute mental status change. Stat CT head showed no acute evidence of acute bleed or evolving infarct. Echo revealed EF 35-40%, hypokinesis of  apex, anterior/septal/lateral apical walls, mid to basal inferolateral wall, impaired relaxation diastolic filling pattern. Patient slowly began to improve.  Peak HS troponin was noted at >27,000. LDL came back elevated at 172 and patient was started on Crestor. Patient was extubated the next day. Bisoprolol was started in place of coreg. Later that day the patient woke up from a nap and was complaining of severe SOB. Patient was started on bipap. CXR showed acute pneumonia and she was started on Vanc/Zosyn. She was given IV lasix 40 mg and also treated with solumedrol for possible COPD exacerbation. Patient was re-intubated for respiratory distress and bronchospasm. Lactic acid came back at 2.2. Limited echo was obtained showing improved EF 40-45%. 2 days later the patient was extubated and started on CPAP. Abx were switched to ceftriaxone. 2 runs of 14 beat NSVT were noted.  Patient slowly weaned off CPAP to room air. Low dose metoprolol was started in place of bisoprolol. Losartan 25 mg was started.  She was taken back to the cath lab for PCI of the RCA.  Plan for DAPT with Brilinta and ASA for 12 months. Crestor 10 mg (low dose started due to intolerance), Losartan 25 mg, and Metoprolol 25 mg mg QD was sent in to her pharmacy. Patient was counseled on tobacco cessation.   Patient was seen and examined by Dr. Irish Lack on 12/20/18 and felt to be stable for discharge. Hospital follow up was arranged.    The patient {does/does not:200015} have symptoms concerning for COVID-19 infection (fever, chills, cough, or new shortness of breath).   Comes in today.  Here with   Past Medical History:  Diagnosis Date   CAD (coronary artery disease) 10/20 DES to pRCA, DES mCx    Cancer of lung (HCC)    Twice   Colon cancer ([place])    COPD (chronic obstructive pulmonary disease) ([place])    Hyperlipidemia    Hypertension    Ischemic cardiomyopathy    Neuropathy    Secondary to chemo   STEMI (ST elevation  myocardial infarction) ([place]) 12/12/2018   Tobacco abuse     Past Surgical History:  Procedure Laterality Date   ABDOMINAL SURGERY     For colon cancer   COLOSTOMY     CORONARY STENT INTERVENTION N/A 12/20/2018   Procedure: CORONARY STENT INTERVENTION - Right;  Surgeon: Burnell Blanks, MD;  Location: [place] [facility];  Service: Cardiovascular;  Laterality: N/A;   CORONARY/GRAFT ACUTE MI REVASCULARIZATION N/A 12/12/2018   Procedure: Coronary/Graft Acute MI Revascularization;  Surgeon: Belva Crome, MD;  Location: [place] [facility];  Service: Cardiovascular;  Laterality: N/A;   LEFT HEART CATH AND CORONARY ANGIOGRAPHY N/A 12/12/2018   Procedure: LEFT HEART CATH AND CORONARY ANGIOGRAPHY;  Surgeon: Belva Crome, MD;  Location: [place] [facility];  Service: Cardiovascular;  Laterality: N/A;     Medications: No outpatient medications have been marked as taking for the 01/03/19 encounter (Appointment) with Burtis Junes, NP.     Allergies: Allergies  Allergen Reactions   Hydrochlorothiazide Other (See Comments)    headaches   Hydrocodone Nausea And Vomiting   Lisinopril Other (See Comments)    Headache    Oxycodone Nausea Only   Statins Other (See Comments)    Social History: The patient  reports that she has been smoking cigarettes. She has never used smokeless tobacco.   Family History: The patient's ***family history includes Hypertension in her father and mother.   Review of Systems: Please see the history of present illness.   All other systems are reviewed and negative.   Physical Exam: VS:  There were no vitals taken for this visit. Marland Kitchen  BMI There is no height or weight on file to calculate BMI.  Wt Readings from Last 3 Encounters:  12/20/18 186 lb (84.4 kg)    General: Pleasant. Well developed, well nourished and in no acute distress.   HEENT: Normal.  Neck: Supple, no JVD, carotid bruits, or masses noted.  Cardiac: ***Regular rate and  rhythm. No murmurs, rubs, or gallops. No edema.  Respiratory:  Lungs are clear to auscultation bilaterally with normal work of breathing.  GI: Soft and nontender.  MS: No deformity or atrophy. Gait and ROM intact.  Skin: Warm and dry. Color is normal.  Neuro:  Strength and sensation are intact and no gross focal deficits noted.  Psych: Alert, appropriate and with normal affect.   LABORATORY DATA:  EKG:  EKG {ACTION; IS/IS WIO:[id number]} ordered today. This demonstrates ***.  Lab Results  Component Value Date   WBC 11.5 (H) 12/20/2018   HGB 14.5 12/20/2018   HCT 45.2 12/20/2018   PLT 250 12/20/2018   GLUCOSE 83 12/19/2018   CHOL 239 (H) 12/13/2018   TRIG 256 (H) 12/17/2018   HDL 38 (L) 12/13/2018   LDLCALC 172 (H) 12/13/2018   ALT 69 (H) 12/13/2018   AST 219 (H) 12/13/2018   NA 138 12/19/2018   K 3.6 12/19/2018   CL 102 12/19/2018   CREATININE 0.97 12/19/2018   BUN 34 (H) 12/19/2018   CO2 27 12/19/2018  INR 1.0 12/12/2018   HGBA1C 5.4 12/12/2018     BNP (last 3 results) Recent Labs    12/12/18 1132  BNP 248.0*    ProBNP (last 3 results) No results for input(s): PROBNP in the last 8760 hours.   Other Studies Reviewed Today:  Cardiac cath 12/12/18  A stent was successfully placed.   Acute lateral wall myocardial infarction due to thrombotic occlusion of the circumflex.  High-grade obstruction of the proximal RCA with collaterals to the apical LAD  Chronic total occlusion of the proximal LAD.  PTCA and stent implantation proximal to mid circumflex reducing 100% stenosis to less than 10% with TIMI grade III flow. Final post dual balloon diameter 2.83 mm using A 22 x 2.75 Onyx. No postdilatation was performed.  Mid anterior wall akinesis with anteroapical hypokinesis. LVEDP 25 mmHg.  Mean pulmonary capillary wedge pressure 7 mmHg, PA pressure 24 over 12 mmHg.  Intra-aortic balloon pump at 1:1 pumping.  Altered mental status prior to intubation.  Etiology is unclear but likely related to CO2 narcosis. Rule out intracranial hemorrhage/bleed in the setting of intense anticoagulation.  RECOMMENDATIONS:   Fluid bolus to increase capillary wedge pressure to 15 mmHg.  IV Levophed to support systolic blood pressure above 100 mmHg.  One-to-one intra-aortic balloon pump counterpulsation.  Aggrastat x18 hours  Load with Brilinta slurry.  Critical care to help manage acute hypoxic/hypercarbic respiratory failure requiring intubation in the Cath Lab (PCO2 was 81 on arterial blood gas prior to intubation).  Preventive therapy including high-dose statin therapy  If the patient has not had an intracranial bleed or CVA.  Will need pulmonary/critical care help with managing ventilator.  Needs a neurology consultation. This would be requested because the patient's mental status acutely changed in the Cath Lab prior to intubation.   Coronary stent intervention 12/20/18  Prox RCA lesion is 80% stenosed.  A drug-eluting stent was successfully placed using a STENT RESOLUTE ONYX 3.0X12.  Post intervention, there is a 0% residual stenosis.  1. Severe stenosis proximal RCA 2. Successful PTCA/DES x 1 proximal RCA  Continue ASA/Brilinta/statin and beta blocker. DAPT for one year.       Echo 12/12/18 Sonographer Comments: Echo performed with patient supine and on artificial respirator and suboptimal parasternal window. Technically difficult study due to pad on chest and poor patient compliance. Unable to remove pad due to patient condition. IMPRESSIONS 1. Technically difficult study with limited views. Left ventricular ejection fraction, by visual estimation, is 35 to 40%. Hypokinesis of apex, anterior/septal/lateral apical walls, mid to basal inferolateral wall 2. Global right ventricle has normal systolic function.The right ventricular size is normal. FINDINGS Left Ventricle: Left ventricular ejection fraction, by visual  estimation, is 35 to 40%. The left ventricle has moderately decreased function. There is mildly increased left ventricular hypertrophy. Concentric left ventricular hypertrophy. Spectral  Doppler shows Left ventricular diastolic Doppler parameters are consistent with impaired relaxation pattern of LV diastolic filling.   Limited echo 12/16/18 IMPRESSIONS 1. Left ventricular ejection fraction, by visual estimation, is 40 to 45%. The left ventricle has moderately decreased function. Normal left ventricular size. There is no left ventricular hypertrophy. 2. Entire inferolateral wall is abnormal. 3. Global right ventricle has normal systolic function.The right ventricular size is normal. No increase in right ventricular wall thickness. 4. Left atrial size was normal. 5. Right atrial size was normal. 6. Moderate mitral annular calcification. 7. The mitral valve is normal in structure. Mild mitral valve regurgitation. No evidence of mitral stenosis. 8.  The tricuspid valve is normal in structure. Tricuspid valve regurgitation was not visualized by color flow Doppler. 9. The aortic valve is normal in structure. Aortic valve regurgitation was not visualized by color flow Doppler. Structurally normal aortic valve, with no evidence of sclerosis or stenosis. 10. The pulmonic valve was normal in structure. Pulmonic valve regurgitation is not visualized by color flow Doppler. 11. The inferior vena cava is normal in size with greater than 50% respiratory variability, suggesting right atrial pressure of 3 mmHg.  Assessment/Plan:  1. Acute lateral wall MI - treated with PCI to the mid LCX - complicated by hypoxia/hypotension - required IAPB support -    2. Residual CAD - treated with PCI to the RCA as staged procedure - has chronically occluded LAD -   3. LV dysfunction -   4. Tobacco abuse  5. HLD - now on statin  6.   . COVID-19 Education: The signs and symptoms of COVID-19 were discussed  with the patient and how to seek care for testing (follow up with PCP or arrange E-visit).  The importance of social distancing, staying at home, hand hygiene and wearing a mask when out in public were discussed today.  Current medicines are reviewed with the patient today.  The patient does not have concerns regarding medicines other than what has been noted above.  The following changes have been made:  See above.  Labs/ tests ordered today include:   No orders of the defined types were placed in this encounter.    Disposition:   FU with *** in {gen number 7-51:700174} {Days to years:10300}.   Patient is agreeable to this plan and will call if any problems develop in the interim.   SignedTruitt Merle, NP  12/28/2018 7:01 AM  [facility] [address] [place], [place]  [address] Phone: [phone] Fax: [phone]

## 2019-01-02 ENCOUNTER — Encounter: Payer: Self-pay | Admitting: Nurse Practitioner

## 2019-01-03 ENCOUNTER — Ambulatory Visit: Payer: Medicare HMO | Admitting: Nurse Practitioner

## 2019-01-08 ENCOUNTER — Inpatient Hospital Stay (HOSPITAL_COMMUNITY)
Admission: EM | Admit: 2019-01-08 | Discharge: 2019-01-11 | DRG: 208 | Disposition: A | Discharge status: Home Health | Payer: Medicare HMO | Attending: Internal Medicine | Admitting: Internal Medicine

## 2019-01-08 ENCOUNTER — Emergency Department (HOSPITAL_COMMUNITY): Payer: Medicare HMO

## 2019-01-08 ENCOUNTER — Other Ambulatory Visit: Payer: Self-pay

## 2019-01-08 DIAGNOSIS — Z20828 Contact with and (suspected) exposure to other viral communicable diseases: Secondary | ICD-10-CM | POA: Diagnosis present

## 2019-01-08 DIAGNOSIS — R7989 Other specified abnormal findings of blood chemistry: Secondary | ICD-10-CM

## 2019-01-08 DIAGNOSIS — Z85118 Personal history of other malignant neoplasm of bronchus and lung: Secondary | ICD-10-CM | POA: Diagnosis not present

## 2019-01-08 DIAGNOSIS — Z955 Presence of coronary angioplasty implant and graft: Secondary | ICD-10-CM

## 2019-01-08 DIAGNOSIS — E785 Hyperlipidemia, unspecified: Secondary | ICD-10-CM | POA: Diagnosis present

## 2019-01-08 DIAGNOSIS — I25118 Atherosclerotic heart disease of native coronary artery with other forms of angina pectoris: Secondary | ICD-10-CM | POA: Diagnosis not present

## 2019-01-08 DIAGNOSIS — Z85828 Personal history of other malignant neoplasm of skin: Secondary | ICD-10-CM | POA: Diagnosis not present

## 2019-01-08 DIAGNOSIS — N179 Acute kidney failure, unspecified: Secondary | ICD-10-CM | POA: Diagnosis present

## 2019-01-08 DIAGNOSIS — N289 Disorder of kidney and ureter, unspecified: Secondary | ICD-10-CM

## 2019-01-08 DIAGNOSIS — J9601 Acute respiratory failure with hypoxia: Secondary | ICD-10-CM

## 2019-01-08 DIAGNOSIS — I255 Ischemic cardiomyopathy: Secondary | ICD-10-CM | POA: Diagnosis present

## 2019-01-08 DIAGNOSIS — G620 Drug-induced polyneuropathy: Secondary | ICD-10-CM | POA: Diagnosis present

## 2019-01-08 DIAGNOSIS — Z85038 Personal history of other malignant neoplasm of large intestine: Secondary | ICD-10-CM | POA: Diagnosis not present

## 2019-01-08 DIAGNOSIS — I248 Other forms of acute ischemic heart disease: Secondary | ICD-10-CM | POA: Diagnosis not present

## 2019-01-08 DIAGNOSIS — J9621 Acute and chronic respiratory failure with hypoxia: Secondary | ICD-10-CM | POA: Diagnosis present

## 2019-01-08 DIAGNOSIS — Z933 Colostomy status: Secondary | ICD-10-CM | POA: Diagnosis not present

## 2019-01-08 DIAGNOSIS — T380X5A Adverse effect of glucocorticoids and synthetic analogues, initial encounter: Secondary | ICD-10-CM | POA: Diagnosis present

## 2019-01-08 DIAGNOSIS — I2129 ST elevation (STEMI) myocardial infarction involving other sites: Secondary | ICD-10-CM | POA: Diagnosis present

## 2019-01-08 DIAGNOSIS — J441 Chronic obstructive pulmonary disease with (acute) exacerbation: Secondary | ICD-10-CM | POA: Diagnosis present

## 2019-01-08 DIAGNOSIS — I110 Hypertensive heart disease with heart failure: Secondary | ICD-10-CM | POA: Diagnosis present

## 2019-01-08 DIAGNOSIS — J9622 Acute and chronic respiratory failure with hypercapnia: Secondary | ICD-10-CM | POA: Diagnosis present

## 2019-01-08 DIAGNOSIS — J9692 Respiratory failure, unspecified with hypercapnia: Secondary | ICD-10-CM | POA: Diagnosis present

## 2019-01-08 DIAGNOSIS — E875 Hyperkalemia: Secondary | ICD-10-CM | POA: Diagnosis present

## 2019-01-08 DIAGNOSIS — R1011 Right upper quadrant pain: Secondary | ICD-10-CM

## 2019-01-08 DIAGNOSIS — E876 Hypokalemia: Secondary | ICD-10-CM | POA: Diagnosis not present

## 2019-01-08 DIAGNOSIS — I959 Hypotension, unspecified: Secondary | ICD-10-CM | POA: Diagnosis present

## 2019-01-08 DIAGNOSIS — R0789 Other chest pain: Secondary | ICD-10-CM | POA: Diagnosis not present

## 2019-01-08 DIAGNOSIS — I2583 Coronary atherosclerosis due to lipid rich plaque: Secondary | ICD-10-CM | POA: Diagnosis not present

## 2019-01-08 DIAGNOSIS — Z8249 Family history of ischemic heart disease and other diseases of the circulatory system: Secondary | ICD-10-CM

## 2019-01-08 DIAGNOSIS — I249 Acute ischemic heart disease, unspecified: Secondary | ICD-10-CM | POA: Diagnosis not present

## 2019-01-08 DIAGNOSIS — Z902 Acquired absence of lung [part of]: Secondary | ICD-10-CM

## 2019-01-08 DIAGNOSIS — E872 Acidosis: Secondary | ICD-10-CM | POA: Diagnosis present

## 2019-01-08 DIAGNOSIS — K8020 Calculus of gallbladder without cholecystitis without obstruction: Secondary | ICD-10-CM | POA: Diagnosis present

## 2019-01-08 DIAGNOSIS — F17210 Nicotine dependence, cigarettes, uncomplicated: Secondary | ICD-10-CM | POA: Diagnosis present

## 2019-01-08 DIAGNOSIS — J9602 Acute respiratory failure with hypercapnia: Secondary | ICD-10-CM | POA: Diagnosis not present

## 2019-01-08 DIAGNOSIS — I2582 Chronic total occlusion of coronary artery: Secondary | ICD-10-CM | POA: Diagnosis present

## 2019-01-08 DIAGNOSIS — I5042 Chronic combined systolic (congestive) and diastolic (congestive) heart failure: Secondary | ICD-10-CM

## 2019-01-08 DIAGNOSIS — I5023 Acute on chronic systolic (congestive) heart failure: Secondary | ICD-10-CM | POA: Diagnosis not present

## 2019-01-08 DIAGNOSIS — T451X5S Adverse effect of antineoplastic and immunosuppressive drugs, sequela: Secondary | ICD-10-CM

## 2019-01-08 DIAGNOSIS — J9691 Respiratory failure, unspecified with hypoxia: Secondary | ICD-10-CM | POA: Diagnosis present

## 2019-01-08 DIAGNOSIS — I2510 Atherosclerotic heart disease of native coronary artery without angina pectoris: Secondary | ICD-10-CM | POA: Diagnosis present

## 2019-01-08 DIAGNOSIS — R739 Hyperglycemia, unspecified: Secondary | ICD-10-CM | POA: Diagnosis present

## 2019-01-08 DIAGNOSIS — E7800 Pure hypercholesterolemia, unspecified: Secondary | ICD-10-CM | POA: Diagnosis not present

## 2019-01-08 LAB — GLUCOSE, CAPILLARY
Glucose-Capillary: 94 mg/dL (ref 70–99)
Glucose-Capillary: 149 mg/dL — ABNORMAL HIGH (ref 70–99)

## 2019-01-08 LAB — CREATININE, SERUM
Creatinine, Ser: 0.97 mg/dL (ref 0.44–1.00)
GFR calc Af Amer: >60 mL/min (ref >60)
GFR calc non Af Amer: >60 mL/min (ref >60)

## 2019-01-08 LAB — TROPONIN I (HIGH SENSITIVITY)
Troponin I (High Sensitivity): 23 ng/L — ABNORMAL HIGH (ref <18)
Troponin I (High Sensitivity): 238 ng/L (ref <18)

## 2019-01-08 LAB — CBC
HCT: 42.4 % (ref 36.0–46.0)
Hemoglobin: 12.8 g/dL (ref 12.0–15.0)
MCH: 27.2 pg (ref 26.0–34.0)
MCHC: 30.2 g/dL (ref 30.0–36.0)
MCV: 90.2 fL (ref 80.0–100.0)
Platelets: 396 10*3/uL (ref 150–400)
RBC: 4.7 MIL/uL (ref 3.87–5.11)
RDW: 15.3 % (ref 11.5–15.5)
WBC: 7.6 10*3/uL (ref 4.0–10.5)
nRBC: 0 % (ref 0.0–0.2)

## 2019-01-08 LAB — POCT I-STAT 7, (LYTES, BLD GAS, ICA,H+H)
Acid-base deficit: 5 mmol/L — ABNORMAL HIGH (ref 0.0–2.0)
Acid-base deficit: 4 mmol/L — ABNORMAL HIGH (ref 0.0–2.0)
Bicarbonate: 24.3 mmol/L (ref 20.0–28.0)
Bicarbonate: 22.8 mmol/L (ref 20.0–28.0)
Calcium, Ion: 1.27 mmol/L (ref 1.15–1.40)
Calcium, Ion: 1.25 mmol/L (ref 1.15–1.40)
HCT: 38 % (ref 36.0–46.0)
HCT: 38 % (ref 36.0–46.0)
Hemoglobin: 12.9 g/dL (ref 12.0–15.0)
Hemoglobin: 12.9 g/dL (ref 12.0–15.0)
O2 Saturation: 100 %
O2 Saturation: 97 %
Patient temperature: 97.7
Potassium: 4.3 mmol/L (ref 3.5–5.1)
Potassium: 4.4 mmol/L (ref 3.5–5.1)
Sodium: 140 mmol/L (ref 135–145)
Sodium: 139 mmol/L (ref 135–145)
TCO2: 26 mmol/L (ref 22–32)
TCO2: 24 mmol/L (ref 22–32)
pCO2 arterial: 62.3 mmHg — ABNORMAL HIGH (ref 32.0–48.0)
pCO2 arterial: 46.4 mmHg (ref 32.0–48.0)
pH, Arterial: 7.196 — CL (ref 7.350–7.450)
pH, Arterial: 7.3 — ABNORMAL LOW (ref 7.350–7.450)
pO2, Arterial: 422 mmHg — ABNORMAL HIGH (ref 83.0–108.0)
pO2, Arterial: 98 mmHg (ref 83.0–108.0)

## 2019-01-08 LAB — COMPREHENSIVE METABOLIC PANEL
ALT: 12 U/L (ref 0–44)
AST: 27 U/L (ref 15–41)
Albumin: 3.2 g/dL — ABNORMAL LOW (ref 3.5–5.0)
Alkaline Phosphatase: 89 U/L (ref 38–126)
Anion gap: 15 (ref 5–15)
BUN: 15 mg/dL (ref 8–23)
CO2: 18 mmol/L — ABNORMAL LOW (ref 22–32)
Calcium: 9.3 mg/dL (ref 8.9–10.3)
Chloride: 105 mmol/L (ref 98–111)
Creatinine, Ser: 1.26 mg/dL — ABNORMAL HIGH (ref 0.44–1.00)
GFR calc Af Amer: 53 mL/min — ABNORMAL LOW (ref >60)
GFR calc non Af Amer: 46 mL/min — ABNORMAL LOW (ref >60)
Glucose, Bld: 266 mg/dL — ABNORMAL HIGH (ref 70–99)
Potassium: 5.3 mmol/L — ABNORMAL HIGH (ref 3.5–5.1)
Sodium: 138 mmol/L (ref 135–145)
Total Bilirubin: 0.6 mg/dL (ref 0.3–1.2)
Total Protein: 6.8 g/dL (ref 6.5–8.1)

## 2019-01-08 LAB — MAGNESIUM: Magnesium: 2.2 mg/dL (ref 1.7–2.4)

## 2019-01-08 LAB — CBC WITH DIFFERENTIAL/PLATELET
Abs Immature Granulocytes: 0.16 10*3/uL — ABNORMAL HIGH (ref 0.00–0.07)
Basophils Absolute: 0.1 10*3/uL (ref 0.0–0.1)
Basophils Relative: 0 %
Eosinophils Absolute: 0.5 10*3/uL (ref 0.0–0.5)
Eosinophils Relative: 4 %
HCT: 47.4 % — ABNORMAL HIGH (ref 36.0–46.0)
Hemoglobin: 14.2 g/dL (ref 12.0–15.0)
Immature Granulocytes: 1 %
Lymphocytes Relative: 43 %
Lymphs Abs: 5.6 10*3/uL — ABNORMAL HIGH (ref 0.7–4.0)
MCH: 27.6 pg (ref 26.0–34.0)
MCHC: 30 g/dL (ref 30.0–36.0)
MCV: 92.2 fL (ref 80.0–100.0)
Monocytes Absolute: 1.2 10*3/uL — ABNORMAL HIGH (ref 0.1–1.0)
Monocytes Relative: 10 %
Neutro Abs: 5.3 10*3/uL (ref 1.7–7.7)
Neutrophils Relative %: 42 %
Platelets: 508 10*3/uL — ABNORMAL HIGH (ref 150–400)
RBC: 5.14 MIL/uL — ABNORMAL HIGH (ref 3.87–5.11)
RDW: 14.9 % (ref 11.5–15.5)
WBC: 13.1 10*3/uL — ABNORMAL HIGH (ref 4.0–10.5)
nRBC: 0 % (ref 0.0–0.2)

## 2019-01-08 LAB — LACTIC ACID, PLASMA
Lactic Acid, Venous: 0.7 mmol/L (ref 0.5–1.9)
Lactic Acid, Venous: 1 mmol/L (ref 0.5–1.9)

## 2019-01-08 LAB — D-DIMER, QUANTITATIVE: D-Dimer, Quant: 2.87 ug/mL-FEU — ABNORMAL HIGH (ref 0.00–0.50)

## 2019-01-08 LAB — SARS CORONAVIRUS 2 BY RT PCR (HOSPITAL ORDER, PERFORMED IN ~~LOC~~ HOSPITAL LAB): SARS Coronavirus 2: NEGATIVE

## 2019-01-08 LAB — PROCALCITONIN: Procalcitonin: 2.17 ng/mL

## 2019-01-08 LAB — STREP PNEUMONIAE URINARY ANTIGEN: Strep Pneumo Urinary Antigen: NEGATIVE

## 2019-01-08 LAB — BRAIN NATRIURETIC PEPTIDE: B Natriuretic Peptide: 915.6 pg/mL — ABNORMAL HIGH (ref 0.0–100.0)

## 2019-01-08 LAB — CBG MONITORING, ED: Glucose-Capillary: 193 mg/dL — ABNORMAL HIGH (ref 70–99)

## 2019-01-08 LAB — PHOSPHORUS: Phosphorus: 4.8 mg/dL — ABNORMAL HIGH (ref 2.5–4.6)

## 2019-01-08 LAB — MRSA PCR SCREENING: MRSA by PCR: NEGATIVE

## 2019-01-08 MED ORDER — IPRATROPIUM-ALBUTEROL 0.5-2.5 (3) MG/3ML IN SOLN
3.0000 mL | Freq: Four times a day (QID) | RESPIRATORY_TRACT | Status: DC
  Administered 2019-01-08 (×2): 3 mL via RESPIRATORY_TRACT
  Filled 2019-01-08 (×2): qty 3

## 2019-01-08 MED ORDER — PANTOPRAZOLE SODIUM 40 MG PO PACK
40.0000 mg | PACK | Freq: Every day | ORAL | Status: DC
  Filled 2019-01-08: qty 20

## 2019-01-08 MED ORDER — ASPIRIN 81 MG PO CHEW
81.0000 mg | CHEWABLE_TABLET | Freq: Every day | ORAL | Status: DC
  Administered 2019-01-08 – 2019-01-11 (×4): 81 mg via ORAL
  Filled 2019-01-08 (×4): qty 1

## 2019-01-08 MED ORDER — FUROSEMIDE 10 MG/ML IJ SOLN
40.0000 mg | Freq: Three times a day (TID) | INTRAMUSCULAR | Status: DC
  Administered 2019-01-08: 40 mg via INTRAVENOUS
  Filled 2019-01-08: qty 4

## 2019-01-08 MED ORDER — CHLORHEXIDINE GLUCONATE CLOTH 2 % EX PADS
6.0000 | MEDICATED_PAD | Freq: Every day | CUTANEOUS | Status: DC
  Administered 2019-01-09: 6 via TOPICAL

## 2019-01-08 MED ORDER — IOHEXOL 350 MG/ML SOLN
75.0000 mL | Freq: Once | INTRAVENOUS | Status: AC | PRN
  Administered 2019-01-08: 75 mL via INTRAVENOUS

## 2019-01-08 MED ORDER — PROPOFOL 1000 MG/100ML IV EMUL
INTRAVENOUS | Status: AC
  Administered 2019-01-08: 10 ug/kg/min
  Filled 2019-01-08: qty 100

## 2019-01-08 MED ORDER — PANTOPRAZOLE SODIUM 40 MG PO TBEC
40.0000 mg | DELAYED_RELEASE_TABLET | Freq: Every day | ORAL | Status: DC
  Administered 2019-01-08 – 2019-01-11 (×4): 40 mg via ORAL
  Filled 2019-01-08 (×4): qty 1

## 2019-01-08 MED ORDER — NOREPINEPHRINE 4 MG/250ML-% IV SOLN
0.0000 ug/min | INTRAVENOUS | Status: DC
  Administered 2019-01-08: 4 ug/min via INTRAVENOUS

## 2019-01-08 MED ORDER — FENTANYL BOLUS VIA INFUSION
50.0000 ug | INTRAVENOUS | Status: DC | PRN
  Filled 2019-01-08: qty 50

## 2019-01-08 MED ORDER — ROSUVASTATIN CALCIUM 5 MG PO TABS
10.0000 mg | ORAL_TABLET | Freq: Every day | ORAL | Status: DC
  Administered 2019-01-08 – 2019-01-10 (×3): 10 mg via ORAL
  Filled 2019-01-08 (×4): qty 2

## 2019-01-08 MED ORDER — SODIUM CHLORIDE 0.9 % IV SOLN: 250.0000 mL | INTRAVENOUS | Status: DC

## 2019-01-08 MED ORDER — ACETAMINOPHEN 325 MG PO TABS
650.0000 mg | ORAL_TABLET | ORAL | Status: DC | PRN
  Administered 2019-01-08 – 2019-01-09 (×3): 650 mg via ORAL
  Filled 2019-01-08 (×4): qty 2

## 2019-01-08 MED ORDER — BISACODYL 10 MG RE SUPP: 10.0000 mg | Freq: Every day | RECTAL | Status: DC | PRN

## 2019-01-08 MED ORDER — TRAMADOL HCL 50 MG PO TABS
50.0000 mg | ORAL_TABLET | Freq: Four times a day (QID) | ORAL | Status: DC | PRN
  Administered 2019-01-08 – 2019-01-11 (×8): 50 mg via ORAL
  Filled 2019-01-08 (×8): qty 1

## 2019-01-08 MED ORDER — DOCUSATE SODIUM 100 MG PO CAPS: 100.0000 mg | ORAL_CAPSULE | Freq: Two times a day (BID) | ORAL | Status: DC | PRN

## 2019-01-08 MED ORDER — BUDESONIDE 0.25 MG/2ML IN SUSP
0.2500 mg | Freq: Two times a day (BID) | RESPIRATORY_TRACT | Status: DC
  Administered 2019-01-08 – 2019-01-11 (×6): 0.25 mg via RESPIRATORY_TRACT
  Filled 2019-01-08 (×7): qty 2

## 2019-01-08 MED ORDER — ALBUTEROL SULFATE (2.5 MG/3ML) 0.083% IN NEBU: 2.5000 mg | INHALATION_SOLUTION | RESPIRATORY_TRACT | Status: DC | PRN

## 2019-01-08 MED ORDER — DOCUSATE SODIUM 50 MG/5ML PO LIQD
100.0000 mg | Freq: Two times a day (BID) | ORAL | Status: DC | PRN
  Filled 2019-01-08: qty 10

## 2019-01-08 MED ORDER — TICAGRELOR 90 MG PO TABS
90.0000 mg | ORAL_TABLET | Freq: Two times a day (BID) | ORAL | Status: DC
  Administered 2019-01-08 – 2019-01-11 (×7): 90 mg via ORAL
  Filled 2019-01-08 (×8): qty 1

## 2019-01-08 MED ORDER — FENTANYL 2500MCG IN NS 250ML (10MCG/ML) PREMIX INFUSION
50.0000 ug/h | INTRAVENOUS | Status: DC
  Administered 2019-01-08: 50 ug/h via INTRAVENOUS
  Filled 2019-01-08: qty 250

## 2019-01-08 MED ORDER — SODIUM CHLORIDE 0.9 % IV SOLN
INTRAVENOUS | Status: DC
  Administered 2019-01-08: 09:00:00 via INTRAVENOUS

## 2019-01-08 MED ORDER — ONDANSETRON HCL 4 MG/2ML IJ SOLN: 4.0000 mg | Freq: Four times a day (QID) | INTRAMUSCULAR | Status: DC | PRN

## 2019-01-08 MED ORDER — HEPARIN SODIUM (PORCINE) 5000 UNIT/ML IJ SOLN
5000.0000 [IU] | Freq: Three times a day (TID) | INTRAMUSCULAR | Status: DC
  Administered 2019-01-08 – 2019-01-09 (×6): 5000 [IU] via SUBCUTANEOUS
  Filled 2019-01-08 (×6): qty 1

## 2019-01-08 MED ORDER — FUROSEMIDE 10 MG/ML IJ SOLN
40.0000 mg | Freq: Once | INTRAMUSCULAR | Status: AC
  Administered 2019-01-08: 40 mg via INTRAVENOUS
  Filled 2019-01-08: qty 4

## 2019-01-08 MED ORDER — FENTANYL CITRATE (PF) 100 MCG/2ML IJ SOLN
INTRAMUSCULAR | Status: AC
  Administered 2019-01-08: 100 ug
  Filled 2019-01-08: qty 2

## 2019-01-08 MED ORDER — MIDAZOLAM HCL 2 MG/2ML IJ SOLN
INTRAMUSCULAR | Status: AC
  Administered 2019-01-08: 5 mg
  Filled 2019-01-08: qty 6

## 2019-01-08 MED ORDER — CHLORHEXIDINE GLUCONATE CLOTH 2 % EX PADS
6.0000 | MEDICATED_PAD | Freq: Every day | CUTANEOUS | Status: DC
  Administered 2019-01-08: 6 via TOPICAL

## 2019-01-08 MED ORDER — FENTANYL CITRATE (PF) 100 MCG/2ML IJ SOLN
100.0000 ug | Freq: Once | INTRAMUSCULAR | Status: AC
  Administered 2019-01-08: 100 ug via INTRAVENOUS
  Filled 2019-01-08: qty 2

## 2019-01-08 MED ORDER — SUCCINYLCHOLINE CHLORIDE 20 MG/ML IJ SOLN
INTRAMUSCULAR | Status: AC | PRN
  Administered 2019-01-08: 100 mg via INTRAVENOUS

## 2019-01-08 MED ORDER — ALPRAZOLAM 0.5 MG PO TABS
0.5000 mg | ORAL_TABLET | Freq: Two times a day (BID) | ORAL | Status: DC | PRN
  Administered 2019-01-08 – 2019-01-11 (×6): 0.5 mg via ORAL
  Filled 2019-01-08 (×6): qty 1

## 2019-01-08 MED ORDER — METHYLPREDNISOLONE SODIUM SUCC 125 MG IJ SOLR
60.0000 mg | Freq: Four times a day (QID) | INTRAMUSCULAR | Status: DC
  Administered 2019-01-08 – 2019-01-09 (×5): 60 mg via INTRAVENOUS
  Filled 2019-01-08 (×5): qty 2

## 2019-01-08 MED ORDER — SODIUM CHLORIDE 0.9 % IV SOLN
500.0000 mg | INTRAVENOUS | Status: DC
  Administered 2019-01-08 – 2019-01-09 (×2): 500 mg via INTRAVENOUS
  Filled 2019-01-08 (×2): qty 500

## 2019-01-08 MED ORDER — ETOMIDATE 2 MG/ML IV SOLN
INTRAVENOUS | Status: AC | PRN
  Administered 2019-01-08: 20 mg via INTRAVENOUS

## 2019-01-08 MED ORDER — FENTANYL CITRATE (PF) 100 MCG/2ML IJ SOLN
50.0000 ug | Freq: Once | INTRAMUSCULAR | Status: AC
  Administered 2019-01-08: 50 ug via INTRAVENOUS
  Filled 2019-01-08: qty 2

## 2019-01-08 NOTE — ED Notes (Signed)
Pt now extubated, tolerated very well, transitioned over to 3L [place]. Axox4.

## 2019-01-08 NOTE — H&P (Signed)
PULMONARY / CRITICAL CARE MEDICINE     NAME:  Chelsea Allison, MRN:  [id number], DOB:  03/25/1955, LOS: 0 ADMISSION DATE:  01/08/2019, CONSULTATION DATE:  01/08/2019 REFERRING MD:  Glick, CHIEF COMPLAINT:  Hypercarbic Respiratory Failure   Pt is sedated and intubated at time of assessment. All information has been obtained from medical records  Brief History   Chelsea Allison is a 63 y.o. female current every day smoker with a history of hypertension, lung cancer x2, colon cancer x2 with nephrostomy, Severe COPD ( Followed at [facility] for pulmonary), neuropathy secondary to chemo. Prior partial right lung resection in 2015 for lung cancer. PET scan at [facility] April 2020 shows no evidence of recurrence .She had a recent admission 10/05 /2020 through 10/13 with STEMI requiring IABP support and stent placement. She presented to the ED via EMS 01/08/2019 with hypercarbic respiratory failure . She was intubated in the ED. PCCM have been asked to admit to ICU and manage care   History of present illness   Chelsea Allison is a 63 y.o. female current every day smoker with a history of hypertension, lung cancer x2, colon cancer x2 with nephrostomy, Severe COPD ( Followed at [facility] for pulmonary), neuropathy secondary to chemo. Prior partial right lung resection in 2015 for lung cancer. PET scan at [facility] April 2020 shows no evidence of recurrence .She had a recent admission 10/05 /2020 through 10/13 with STEMI requiring IABP support and stent placement.   She presented to the ED via EMS 01/08/2019 with hypercarbic respiratory failure . She called 911 complaining of chest pain and stopped responding clearly on the phone.  EMS had to force entry and found the patient with stertorous respirations and a very slow respiratory rate. She initially had no gag reflex but attempt to place a King airway were met with a vigorous gag reflex.  The assisted respirations with Ambu bag and nasal trumpet in place.  They  were not able to obtain any vital signs in route. She was intubated in the ED. Propofol was initiated for sedation and patient became hypotensive. Levophed was initiated . Once propofol was stopped, nursing has been able to wean pressors. Troponin was initially minimally elevated, but has up trended, labs indicated mild renal insufficiency slightly worse than baseline, unclear if this is actually an acute kidney injury.  D-dimer is obtained because of recent hospitalization, and is moderately elevated.  She was  sent for CT angiogram of the chest which shows no evidence of pulmonary emboli, probable pulmonary vascular congestion. Pt. Has become more awake and alert  with mechanical ventilation . Sedation has been changed to fentanyl.  Labs in the ED  Initial Blood Gas on 100% >> 7.19 / 62 / 422 / 24.3 / 26 BNP 915 Troponin ( HS) initially 23, increased to  238 Creatinine 1.26 Initial potassium 5.3>> 4.3 D-dimer 2.87>> CTA Negative for PE WBC 13.1 / HGB 14.2 / platelets 508  Past Medical History   Past Medical History:  Diagnosis Date  . CAD (coronary artery disease) 10/20 DES to pRCA, DES mCx   . Cancer of lung (HCC)    Twice  . Colon cancer (HCC)   . COPD (chronic obstructive pulmonary disease) (HCC)   . Hyperlipidemia   . Hypertension   . Ischemic cardiomyopathy   . Neuropathy    Secondary to chemo  . STEMI (ST elevation myocardial infarction) (HCC) 12/12/2018  . Tobacco abuse     Significant Hospital Events     10/5-10/13/2020-Admit with MI, status post cath, intra-aortic balloon pump 12/20/2018- Coronary Stent intervention Proximal RCA ( 80% stenosed)  Consults:  01/08/2019 CARDS  Procedures:  01/08/2019 ETT  Significant Diagnostic Tests:  01/08/2019 CTA No pulmonary embolus. Moderate pulmonary edema and bilateral dependent atelectasis. Postradiation fibrosis of the medial right hemithorax. Coronary artery and aortic Atherosclerosis (ICD10-I70.0).  01/08/2019 CXR The  endotracheal tube terminates approximately 2.8 cm above the carina. The enteric tube extends below lead left hemidiaphragm. There is right lung volume loss consistent with the patient's history of a prior right upper lobe lobectomy. Dense consolidation is noted in the right perihilar region which is similar to prior studies. The heart size is stable. Aortic calcifications are again noted. There is no pneumothorax. No significant pleural effusion, however the left costophrenic angle is not fully visualized.  Stable appearance of the chest with right-sided volume loss and post radiation changes in the medial right upper lung zone.  12/20/2018 Cardiac Cath  Acute lateral wall myocardial infarction due to thrombotic occlusion of the circumflex.  High-grade obstruction of the proximal RCA with collaterals to the apical LAD  Chronic total occlusion of the proximal LAD.  PTCA and stent implantation proximal to mid circumflex reducing 100% stenosis to less than 10% with TIMI grade III flow. Final post dual balloon diameter 2.83 mm using A 22 x 2.75 Onyx. No postdilatation was performed.  Mid anterior wall akinesis with anteroapical hypokinesis. LVEDP 25 mmHg   Micro Data:  01/08/2019 Sputum 01/08/2019 Urine for strep 11.1.2020 Urine for legionella  Antimicrobials:     Interim history/subjective:  Sedated and intubated , on fentanyl gtt , levo at low dose for BP decrease most likely 2/2 sedation. Troponins up trending>> Cards consulted  Objective   Blood pressure (!) 84/52, pulse 73, temperature 98.9 F (37.2 C), resp. rate 18, height 5' 8" (1.727 m), weight 84 kg, SpO2 93 %.    Vent Mode: PRVC FiO2 (%):  [40 %-100 %] 40 % Set Rate:  [18 bmp] 18 bmp Vt Set:  [510 mL] 510 mL PEEP:  [5 cmH20] 5 cmH20 Plateau Pressure:  [25 cmH20-30 cmH20] 30 cmH20  No intake or output data in the 24 hours ending 01/08/19 0747 Filed Weights   01/08/19 0340  Weight: 84 kg     Examination: General: Sedated elderly female intubated , in NAD HENT: NCAT, ETT secure and in place, No JVD, No LAD Lungs: Bilateral chest excursion, coarse rhonchi throughout, diminished per bases Cardiovascular: S1, S2, RRR, No murmur, rub or gallop Abdomen: Soft, NT, ND, BS+, Body mass index is 28.16 kg/m. Extremities: Warm and dry with brisk refill, no mottling, no obvious deformities Neuro: Sedated and intubated, per nursing follows commands and lip speaks appropriately GU: Foley with clear maber urine  Resolved Hospital Problem list     Assessment & Plan:  Acute on chronic hypercarbic respiratory failure Hx Severe COPD Hx. Lung cancer with partial right lung resection 2015 CXR with  right-sided volume loss and post radiation changes in the medial right upper lung zone. CTA with moderate pulmonary edema / atelectasis Plan Titrate FiO2 and PEEP for sats of 88-92 % ABG now Fentanyl gtt , RASS Goal -1 VAP protovol CXR in am and pm Lasix 40 now ( if not previously given)   Previous STEMI with DES placement 10/13/ 2020 EKG with no acute changes BNP 915 Troponins increased from 23- 238 in 3 hours Plan Cards consult 12 Lead on arrival to ICU and prn Hold current HTN   meds  Will need to add BB once BP has improved 2 D Echo now Trend troponin Trend BNP Mag now and repelete as needed  Continue ASA/Brilinta/statin and beta blocker.  DAPT for one year per cards    Leukocytosis ? Infectious vs reactive Afebrile Plan Trend WBC and fever Curve Sputum Culture>> Follow  Trend CXR Cultures as is clinically appropriate  No ABX at present , will watch and initiate if becomes febrile   Hypotension 2/2 Sedation ( propofol) Plan MAP goal > 65 Wean levophed for MAP goal  Moderate renal insufficiency vs AKI Good UO Plan Trend BMET Renal dosing medications Monitor UO Maintain renal perfusion Trend and replete electrolytes as needed    Best practice:  Diet:  NPO Pain/Anxiety/Delirium protocol (if indicated): Fentanyl gtt VAP protocol (if indicated): Initiated DVT prophylaxis: Heparin SQ GI prophylaxis: Protonix Glucose control: CBG and SSI Mobility: BR Code Status: Full Family Communication: pending Disposition: ICU  Labs   CBC: Recent Labs  Lab 01/08/19 0306 01/08/19 0439  WBC 13.1*  --   NEUTROABS 5.3  --   HGB 14.2 12.9  HCT 47.4* 38.0  MCV 92.2  --   PLT 508*  --     Basic Metabolic Panel: Recent Labs  Lab 01/08/19 0306 01/08/19 0439  NA 138 140  K 5.3* 4.3  CL 105  --   CO2 18*  --   GLUCOSE 266*  --   BUN 15  --   CREATININE 1.26*  --   CALCIUM 9.3  --    GFR: Estimated Creatinine Clearance: 52.5 mL/min (A) (by C-G formula based on SCr of 1.26 mg/dL (H)). Recent Labs  Lab 01/08/19 0306  WBC 13.1*    Liver Function Tests: Recent Labs  Lab 01/08/19 0306  AST 27  ALT 12  ALKPHOS 89  BILITOT 0.6  PROT 6.8  ALBUMIN 3.2*   No results for input(s): LIPASE, AMYLASE in the last 168 hours. No results for input(s): AMMONIA in the last 168 hours.  ABG    Component Value Date/Time   PHART 7.196 (LL) 01/08/2019 0439   PCO2ART 62.3 (H) 01/08/2019 0439   PO2ART 422.0 (H) 01/08/2019 0439   HCO3 24.3 01/08/2019 0439   TCO2 26 01/08/2019 0439   ACIDBASEDEF 5.0 (H) 01/08/2019 0439   O2SAT 100.0 01/08/2019 0439     Coagulation Profile: No results for input(s): INR, PROTIME in the last 168 hours.  Cardiac Enzymes: No results for input(s): CKTOTAL, CKMB, CKMBINDEX, TROPONINI in the last 168 hours.  HbA1C: Hgb A1c MFr Bld  Date/Time Value Ref Range Status  12/12/2018 11:32 AM 5.4 4.8 - 5.6 % Final    Comment:    (NOTE) Pre diabetes:          5.7%-6.4% Diabetes:              >6.4% Glycemic control for   <7.0% adults with diabetes     CBG: Recent Labs  Lab 01/08/19 0343  GLUCAP 193*    Review of Systems:   Unable as patient is sedated and intubated  Past Medical History  She,  has a past  medical history of CAD (coronary artery disease) 10/20 DES to pRCA, DES mCx, Cancer of lung (HCC), Colon cancer (HCC), COPD (chronic obstructive pulmonary disease) (HCC), Hyperlipidemia, Hypertension, Ischemic cardiomyopathy, Neuropathy, STEMI (ST elevation myocardial infarction) (HCC) (12/12/2018), and Tobacco abuse.   Surgical History    Past Surgical History:  Procedure Laterality Date  . ABDOMINAL SURGERY       For colon cancer  . COLOSTOMY    . CORONARY STENT INTERVENTION N/A 12/20/2018   Procedure: CORONARY STENT INTERVENTION - Right;  Surgeon: McAlhany, Christopher D, MD;  Location: [facility]ASIVE [facility];  Service: Cardiovascular;  Laterality: N/A;  . CORONARY/GRAFT ACUTE MI REVASCULARIZATION N/A 12/12/2018   Procedure: Coronary/Graft Acute MI Revascularization;  Surgeon: Smith, Henry W, MD;  Location: [facility] INVAS[facility] CV [facility];  Service: Cardiovascular;  Laterality: N/A;  . LEFT HEART CATH AND CORONARY ANGIOGRAPHY N/A 12/12/2018   Procedure: LEFT HEART CATH AND CORONARY ANGIOGRAPHY;  Surgeon: Smith, Henry W, MD;  Location: [facility] CV [facility];  Service: Cardiovascular;  Laterality: N/A;     Social History   reports that she has been smoking cigarettes. She has never used smokeless tobacco. She reports that she does not drink alcohol or use drugs.   Family History   Her family history includes Hypertension in her father and mother.   Allergies Allergies  Allergen Reactions  . Hydrochlorothiazide Other (See Comments)    headaches  . Hydrocodone Nausea And Vomiting  . Lisinopril Other (See Comments)    Headache   . Oxycodone Nausea Only  . Statins Other (See Comments)     Home Medications  Prior to Admission medications   Medication Sig Start Date End Date Taking? Authorizing Provider  ALPRAZolam (XANAX) 0.5 MG tablet Take 0.5 mg by mouth 2 (two) times daily as needed for anxiety. 10/24/16   [provider]  aspirin 81 MG chewable tablet Chew 1 tablet (81 mg total) by mouth  daily. 12/21/18   Furth, Cadence H, PA-C  dicyclomine (BENTYL) 10 MG capsule Take 10 mg by mouth 3 (three) times daily as needed for cramping. 11/30/18   [provider]  fluticasone furoate-vilanterol (BREO ELLIPTA) 200-25 MCG/INH AEPB Inhale 1 puff into the lungs daily.    [provider]  gabapentin (NEURONTIN) 300 MG capsule Take 300 mg by mouth 3 (three) times daily. 04/27/16   [provider]  ipratropium-albuterol (DUONEB) 0.5-2.5 (3) MG/3ML SOLN Inhale 3 mLs into the lungs every 4 (four) hours as needed for shortness of breath or wheezing. 01/15/16   [provider]  losartan (COZAAR) 25 MG tablet Take 0.5 tablets (12.5 mg total) by mouth daily. 12/20/18   Furth, Cadence H, PA-C  metoprolol succinate (TOPROL-XL) 25 MG 24 hr tablet Take 1 tablet (25 mg total) by mouth daily. 12/20/18   Furth, Cadence H, PA-C  nitroGLYCERIN (NITROSTAT) 0.4 MG SL tablet Place 1 tablet (0.4 mg total) under the tongue every 5 (five) minutes x 3 doses as needed for chest pain. 12/20/18   Furth, Cadence H, PA-C  rosuvastatin (CRESTOR) 10 MG tablet Take 1 tablet (10 mg total) by mouth daily at 6 PM. 12/20/18   Furth, Cadence H, PA-C  ticagrelor (BRILINTA) 90 MG TABS tablet Take 1 tablet (90 mg total) by mouth 2 (two) times daily. 12/20/18   Furth, Cadence H, PA-C  traMADol (ULTRAM) 50 MG tablet Take 100 mg by mouth every 6 (six) hours as needed for moderate pain.    [provider]  Vitamin D, Ergocalciferol, (DRISDOL) 1.25 MG (50000 UT) CAPS capsule Take 50,000 Units by mouth every 7 (seven) days. 07/04/18   [provider]     Critical care time: 60 minutes   Sarah F. Groce, MSN, AGACNP-BC [place] [facility] Pager # [phone] After 4 pm please call [phone] 01/08/2019 9:10 AM    

## 2019-01-08 NOTE — ED Triage Notes (Signed)
Pt BIB [facility] for eval of respiratory arrest. EMS reports that patient went unresponsive w/ dispatcher after calling for shortness of breath/chest pain. EMS placed NBA, BVM assisting ventilations en route. On arrival, pt was intubated for airway protection. #18G PIV L hand.

## 2019-01-08 NOTE — Procedures (Signed)
Extubation Procedure Note  Patient Details:   Name: Syniyah Bourne DOB: 07/31/1955 MRN: [id number]   Airway Documentation:  Airway 7.5 mm (Active)  Secured at (cm) 23 cm 01/08/19 0721  Measured From Lips 01/08/19 [place] 01/08/19 7915  Secured By [employer] 01/08/19 0721   Vent end date: (not recorded) Vent end time: (not recorded)   Evaluation  O2 sats: stable throughout Complications: No apparent complications Patient did tolerate procedure well. Bilateral Breath Sounds: Diminished, Rhonchi   Yes   PT was extubated to 3l [place] PT stats are stable, has a strong cough and is able to talk. Use Biipap if need  Madison Albea, Leonie Douglas 01/08/2019, 10:01 AM

## 2019-01-08 NOTE — Progress Notes (Signed)
[facility] Progress Note Patient Name: Chelsea Allison DOB: 12/22/1955 MRN: [id number]   Date of Service  01/08/2019  HPI/Events of Note  Patient c/o back pain and takes Tramadol at home. Able to take PO.   eICU Interventions  Will order: 1. Tramadol 50 mg PO Q 6 hours PRN pain.     Intervention Category Major Interventions: Other:  TRUE Garciamartinez Cornelia Copa 01/08/2019, 10:10 PM

## 2019-01-08 NOTE — ED Notes (Signed)
ED TO INPATIENT HANDOFF REPORT  ED Nurse Name and Phone #: Asencion Partridge  S Name/Age/Gender [place] Allison 63 y.o. female Room/Bed: TRAAC/TRAAC  Code Status   Code Status: Full Code  Home/SNF/Other Home Patient oriented to: self, place, time and situation Is this baseline? Yes   Triage Complete: Triage complete  Chief Complaint unresponsive  Triage Note Pt BIB [facility] for eval of respiratory arrest. EMS reports that patient went unresponsive w/ dispatcher after calling for shortness of breath/chest pain. EMS placed NBA, BVM assisting ventilations en route. On arrival, pt was intubated for airway protection. #18G PIV L hand.    Allergies Allergies  Allergen Reactions  . Hydrochlorothiazide Other (See Comments)    headaches  . Hydrocodone Nausea And Vomiting  . Lisinopril Other (See Comments)    Headache   . Oxycodone Nausea Only  . Statins Other (See Comments)    Level of Care/Admitting Diagnosis ED Disposition    ED Disposition Condition Comment   Admit  Hospital Area: [place] [[id number]]  Level of Care: ICU [6]  Covid Evaluation: Confirmed COVID Negative  Diagnosis: Respiratory failure with hypoxia and hypercapnia [facility]) [[id number]]  Admitting Physician: Margaretha Seeds [[id number]]  Attending Physician: Margaretha Seeds [phone]  Estimated length of stay: 5 - 7 days  Certification:: I certify this patient will need inpatient services for at least 2 midnights  PT Class (Do Not Modify): Inpatient [101]  PT Acc Code (Do Not Modify): Private [1]       B Medical/Surgery History Past Medical History:  Diagnosis Date  . CAD (coronary artery disease) 10/20 DES to pRCA, DES mCx   . Cancer of lung ([place])    Twice  . Colon cancer ([place])   . COPD (chronic obstructive pulmonary disease) ([place])   . Hyperlipidemia   . Hypertension   . Ischemic cardiomyopathy   . Neuropathy    Secondary to chemo  . STEMI (ST elevation myocardial infarction) ([place])  12/12/2018  . Tobacco abuse    Past Surgical History:  Procedure Laterality Date  . ABDOMINAL SURGERY     For colon cancer  . COLOSTOMY    . CORONARY STENT INTERVENTION N/A 12/20/2018   Procedure: CORONARY STENT INTERVENTION - Right;  Surgeon: Burnell Blanks, MD;  Location: [place] [facility];  Service: Cardiovascular;  Laterality: N/A;  . CORONARY/GRAFT ACUTE MI REVASCULARIZATION N/A 12/12/2018   Procedure: Coronary/Graft Acute MI Revascularization;  Surgeon: Belva Crome, MD;  Location: [place] [facility];  Service: Cardiovascular;  Laterality: N/A;  . LEFT HEART CATH AND CORONARY ANGIOGRAPHY N/A 12/12/2018   Procedure: LEFT HEART CATH AND CORONARY ANGIOGRAPHY;  Surgeon: Belva Crome, MD;  Location: [place] [facility];  Service: Cardiovascular;  Laterality: N/A;     A IV Location/Drains/Wounds Patient Lines/Drains/Airways Status   Active Line/Drains/Airways    Name:   Placement date:   Placement time:   Site:   Days:   Peripheral IV 01/08/19 Left Wrist   01/08/19    0306    Wrist   less than 1   Peripheral IV 01/08/19 Right Wrist   01/08/19    0317    Wrist   less than 1   Peripheral IV 01/08/19 Right Antecubital   01/08/19    0322    Antecubital   less than 1   Colostomy LLQ   -    -    LLQ      Urethral Catheter Suezanne Jacquet, RN Temperature probe 14  Fr.   01/08/19    0352    Temperature probe   less than 1          Intake/Output Last 24 hours  Intake/Output Summary (Last 24 hours) at 01/08/2019 1036 Last data filed at 01/08/2019 0945 Gross per 24 hour  Intake 256.11 ml  Output 750 ml  Net -493.89 ml    Labs/Imaging Results for orders placed or performed during the hospital encounter of 01/08/19 (from the past 48 hour(s))  Comprehensive metabolic panel     Status: Abnormal   Collection Time: 01/08/19  3:06 AM  Result Value Ref Range   Sodium 138 135 - 145 mmol/L   Potassium 5.3 (H) 3.5 - 5.1 mmol/L   Chloride 105 98 - 111 mmol/L   CO2 18 (L) 22 - 32 mmol/L    Glucose, Bld 266 (H) 70 - 99 mg/dL   BUN 15 8 - 23 mg/dL   Creatinine, Ser 1.26 (H) 0.44 - 1.00 mg/dL   Calcium 9.3 8.9 - 10.3 mg/dL   Total Protein 6.8 6.5 - 8.1 g/dL   Albumin 3.2 (L) 3.5 - 5.0 g/dL   AST 27 15 - 41 U/L   ALT 12 0 - 44 U/L   Alkaline Phosphatase 89 38 - 126 U/L   Total Bilirubin 0.6 0.3 - 1.2 mg/dL   GFR calc non Af Amer 46 (L) >60 mL/min   GFR calc Af Amer 53 (L) >60 mL/min   Anion gap 15 5 - 15    Comment: Performed at [facility], [address] [place], [place] [address]  Troponin I (High Sensitivity)     Status: Abnormal   Collection Time: 01/08/19  3:06 AM  Result Value Ref Range   Troponin I (High Sensitivity) 23 (H) <18 ng/L    Comment: (NOTE) Elevated high sensitivity troponin I (hsTnI) values and significant  changes across serial measurements may suggest ACS but many other  chronic and acute conditions are known to elevate hsTnI results.  Refer to the "Links" section for chest pain algorithms and additional  guidance. Performed at [facility], [place] [address], [place], [place] [address]   CBC with Differential     Status: Abnormal   Collection Time: 01/08/19  3:06 AM  Result Value Ref Range   WBC 13.1 (H) 4.0 - 10.5 K/uL   RBC 5.14 (H) 3.87 - 5.11 MIL/uL   Hemoglobin 14.2 12.0 - 15.0 g/dL   HCT 47.4 (H) 36.0 - 46.0 %   MCV 92.2 80.0 - 100.0 fL   MCH 27.6 26.0 - 34.0 pg   MCHC 30.0 30.0 - 36.0 g/dL   RDW 14.9 11.5 - 15.5 %   Platelets 508 (H) 150 - 400 K/uL   nRBC 0.0 0.0 - 0.2 %   Neutrophils Relative % 42 %   Neutro Abs 5.3 1.7 - 7.7 K/uL   Lymphocytes Relative 43 %   Lymphs Abs 5.6 (H) 0.7 - 4.0 K/uL   Monocytes Relative 10 %   Monocytes Absolute 1.2 (H) 0.1 - 1.0 K/uL   Eosinophils Relative 4 %   Eosinophils Absolute 0.5 0.0 - 0.5 K/uL   Basophils Relative 0 %   Basophils Absolute 0.1 0.0 - 0.1 K/uL   Immature Granulocytes 1 %   Abs Immature Granulocytes 0.16 (H) 0.00 - 0.07 K/uL    Comment: Performed at [facility] [address], [place], [place] [address]  D-dimer, quantitative  Status: Abnormal   Collection Time: 01/08/19  3:06 AM  Result Value Ref Range   D-Dimer, Quant 2.87 (H) 0.00 - 0.50 ug/mL-FEU    Comment: (NOTE) At the manufacturer cut-off of 0.50 ug/mL FEU, this assay has been documented to exclude PE with a sensitivity and negative predictive value of 97 to 99%.  At this time, this assay has not been approved by the FDA to exclude DVT/VTE. Results should be correlated with clinical presentation. Performed at [facility], [place] [address], [place], [place] [address]   SARS Coronavirus 2 by RT PCR (hospital order, performed in [facility] hospital lab) Nasopharyngeal Nasopharyngeal Swab     Status: None   Collection Time: 01/08/19  3:06 AM   Specimen: Nasopharyngeal Swab  Result Value Ref Range   SARS Coronavirus 2 NEGATIVE NEGATIVE    Comment: (NOTE) If result is NEGATIVE SARS-CoV-2 target nucleic acids are NOT DETECTED. The SARS-CoV-2 RNA is generally detectable in upper and lower  respiratory specimens during the acute phase of infection. The lowest  concentration of SARS-CoV-2 viral copies this assay can detect is 250  copies / mL. A negative result does not preclude SARS-CoV-2 infection  and should not be used as the sole basis for treatment or other  patient management decisions.  A negative result may occur with  improper specimen collection / handling, submission of specimen other  than nasopharyngeal swab, presence of viral mutation(s) within the  areas targeted by this assay, and inadequate number of viral copies  (<250 copies / mL). A negative result must be combined with clinical  observations, patient history, and epidemiological information. If result is POSITIVE SARS-CoV-2 target nucleic acids are DETECTED. The SARS-CoV-2 RNA is generally detectable in upper and lower  respiratory specimens dur ing the acute phase of infection.  Positive  results  are indicative of active infection with SARS-CoV-2.  Clinical  correlation with patient history and other diagnostic information is  necessary to determine patient infection status.  Positive results do  not rule out bacterial infection or co-infection with other viruses. If result is PRESUMPTIVE POSTIVE SARS-CoV-2 nucleic acids MAY BE PRESENT.   A presumptive positive result was obtained on the submitted specimen  and confirmed on repeat testing.  While 2019 novel coronavirus  (SARS-CoV-2) nucleic acids may be present in the submitted sample  additional confirmatory testing may be necessary for epidemiological  and / or clinical management purposes  to differentiate between  SARS-CoV-2 and other Sarbecovirus currently known to infect humans.  If clinically indicated additional testing with an alternate test  methodology [id number]) is advised. The SARS-CoV-2 RNA is generally  detectable in upper and lower respiratory sp ecimens during the acute  phase of infection. The expected result is Negative. Fact Sheet for Patients:  StrictlyIdeas.no Fact Sheet for Healthcare Providers: BankingDealers.co.za This test is not yet approved or cleared by the [place] FDA and has been authorized for detection and/or diagnosis of SARS-CoV-2 by FDA under an Emergency Use Authorization (EUA).  This EUA will remain in effect (meaning this test can be used) for the duration of the COVID-19 declaration under Section 564(b)(1) of the Act, 21 U.S.C. section 360bbb-3(b)(1), unless the authorization is terminated or revoked sooner. Performed at [facility], [place] [address], [place], [place] [address]   Brain natriuretic peptide     Status: Abnormal   Collection Time: 01/08/19  3:06 AM  Result Value Ref Range   B Natriuretic Peptide 915.6 (H) 0.0 -  100.0 pg/mL    Comment: Performed at [facility], [place] [address] [place], [place] [address]  CBG  monitoring, ED     Status: Abnormal   Collection Time: 01/08/19  3:43 AM  Result Value Ref Range   Glucose-Capillary 193 (H) 70 - 99 mg/dL  I-STAT 7, (LYTES, BLD GAS, ICA, H+H)     Status: Abnormal   Collection Time: 01/08/19  4:39 AM  Result Value Ref Range   pH, Arterial 7.196 (LL) 7.350 - 7.450   pCO2 arterial 62.3 (H) 32.0 - 48.0 mmHg   pO2, Arterial 422.0 (H) 83.0 - 108.0 mmHg   Bicarbonate 24.3 20.0 - 28.0 mmol/L   TCO2 26 22 - 32 mmol/L   O2 Saturation 100.0 %   Acid-base deficit 5.0 (H) 0.0 - 2.0 mmol/L   Sodium 140 135 - 145 mmol/L   Potassium 4.3 3.5 - 5.1 mmol/L   Calcium, Ion 1.27 1.15 - 1.40 mmol/L   HCT 38.0 36.0 - 46.0 %   Hemoglobin 12.9 12.0 - 15.0 g/dL   Patient temperature 97.7 F    Collection site RADIAL, ALLEN'S TEST ACCEPTABLE    Drawn by RT    Sample type ARTERIAL    Comment NOTIFIED PHYSICIAN   Troponin I (High Sensitivity)     Status: Abnormal   Collection Time: 01/08/19  6:15 AM  Result Value Ref Range   Troponin I (High Sensitivity) 238 (HH) <18 ng/L    Comment: CRITICAL RESULT CALLED TO, READ BACK BY AND VERIFIED WITH: D.Jaleena Viviani,RN @ 0710 01/08/2019 WEBBERJ (NOTE) Elevated high sensitivity troponin I (hsTnI) values and significant  changes across serial measurements may suggest ACS but many other  chronic and acute conditions are known to elevate hsTnI results.  Refer to the Links section for chest pain algorithms and additional  guidance. Performed at [facility], [place] [address], [place], [place] [address]   CBC     Status: None   Collection Time: 01/08/19  8:36 AM  Result Value Ref Range   WBC 7.6 4.0 - 10.5 K/uL   RBC 4.70 3.87 - 5.11 MIL/uL   Hemoglobin 12.8 12.0 - 15.0 g/dL   HCT 42.4 36.0 - 46.0 %   MCV 90.2 80.0 - 100.0 fL   MCH 27.2 26.0 - 34.0 pg   MCHC 30.2 30.0 - 36.0 g/dL   RDW 15.3 11.5 - 15.5 %   Platelets 396 150 - 400 K/uL   nRBC 0.0 0.0 - 0.2 %    Comment: Performed at [facility], [place] [address]  [place], [place] [address]  Creatinine, serum     Status: None   Collection Time: 01/08/19  8:36 AM  Result Value Ref Range   Creatinine, Ser 0.97 0.44 - 1.00 mg/dL   GFR calc non Af Amer >60 >60 mL/min   GFR calc Af Amer >60 >60 mL/min    Comment: Performed at [facility] [address], [place], [place] [address]  Magnesium     Status: None   Collection Time: 01/08/19  8:36 AM  Result Value Ref Range   Magnesium 2.2 1.7 - 2.4 mg/dL    Comment: Performed at [facility] [address], [place], [place] [address]  Phosphorus     Status: Abnormal   Collection Time: 01/08/19  8:36 AM  Result Value Ref Range   Phosphorus 4.8 (H) 2.5 - 4.6 mg/dL    Comment: Performed at [facility],  [address] [place], [place] [address]  Procalcitonin     Status: None   Collection Time: 01/08/19  8:36 AM  Result Value Ref Range   Procalcitonin 2.17 ng/mL    Comment:        Interpretation: PCT > 2 ng/mL: Systemic infection (sepsis) is likely, unless other causes are known. (NOTE)       Sepsis PCT Algorithm           Lower Respiratory Tract                                      Infection PCT Algorithm    ----------------------------     ----------------------------         PCT < 0.25 ng/mL                PCT < 0.10 ng/mL         Strongly encourage             Strongly discourage   discontinuation of antibiotics    initiation of antibiotics    ----------------------------     -----------------------------       PCT 0.25 - 0.50 ng/mL            PCT 0.10 - 0.25 ng/mL               OR       >80% decrease in PCT            Discourage initiation of                                            antibiotics      Encourage discontinuation           of antibiotics    ----------------------------     -----------------------------         PCT >= 0.50 ng/mL              PCT 0.26 - 0.50 ng/mL               AND       <80% decrease in PCT              Encourage initiation of                                              antibiotics       Encourage continuation           of antibiotics    ----------------------------     -----------------------------        PCT >= 0.50 ng/mL                  PCT > 0.50 ng/mL               AND         increase in PCT                  Strongly encourage  initiation of antibiotics    Strongly encourage escalation           of antibiotics                                     -----------------------------                                           PCT <= 0.25 ng/mL                                                 OR                                        > 80% decrease in PCT                                     Discontinue / Do not initiate                                             antibiotics Performed at [facility], [address] [place], [place] [address]   I-STAT 7, (LYTES, BLD GAS, ICA, H+H)     Status: Abnormal   Collection Time: 01/08/19  8:37 AM  Result Value Ref Range   pH, Arterial 7.300 (L) 7.350 - 7.450   pCO2 arterial 46.4 32.0 - 48.0 mmHg   pO2, Arterial 98.0 83.0 - 108.0 mmHg   Bicarbonate 22.8 20.0 - 28.0 mmol/L   TCO2 24 22 - 32 mmol/L   O2 Saturation 97.0 %   Acid-base deficit 4.0 (H) 0.0 - 2.0 mmol/L   Sodium 139 135 - 145 mmol/L   Potassium 4.4 3.5 - 5.1 mmol/L   Calcium, Ion 1.25 1.15 - 1.40 mmol/L   HCT 38.0 36.0 - 46.0 %   Hemoglobin 12.9 12.0 - 15.0 g/dL   Patient temperature HIDE    Sample type ARTERIAL   Lactic acid, plasma     Status: None   Collection Time: 01/08/19  8:39 AM  Result Value Ref Range   Lactic Acid, Venous 0.7 0.5 - 1.9 mmol/L    Comment: Performed at [facility] [address] [place], [place] [address]   Ct Angio Chest Pe W And/or Wo Contrast  Result Date: 01/08/2019 CLINICAL DATA:  Shortness of breath EXAM: CT ANGIOGRAPHY CHEST WITH CONTRAST TECHNIQUE: Multidetector CT imaging of the chest was performed using the standard protocol during  bolus administration of intravenous contrast. Multiplanar CT image reconstructions and MIPs were obtained to evaluate the vascular anatomy. CONTRAST:  35mL OMNIPAQUE IOHEXOL 350 MG/ML SOLN COMPARISON:  None. FINDINGS: Cardiovascular: --Pulmonary arteries: Contrast injection is sufficient to demonstrate satisfactory opacification of the pulmonary arteries to the segmental level, with attenuation of at least 200 HU at the main pulmonary artery. There is no pulmonary embolus. The main pulmonary artery is within normal  limits for size. --Aorta: Limited opacification of the aorta due to bolus timing optimization for the pulmonary arteries. The course and caliber of the aorta are normal. Conventional 3 vessel aortic branching pattern. There is moderate aortic atherosclerosis. --Heart: Normal size. No pericardial effusion. There are coronary artery calcifications. Mediastinum/Nodes: No mediastinal, hilar or axillary lymphadenopathy. The visualized thyroid and thoracic esophageal course are unremarkable. Lungs/Pleura: There is moderate pulmonary edema with bilateral dependent atelectasis. There is scarring along the medial right hemithorax that has the appearance of radiation fibrosis. There is an 8 mm right middle lobe nodule and an 11 mm left lower lobe nodule. Both of these may be inflammatory. Upper Abdomen: Contrast bolus timing is not optimized for evaluation of the abdominal organs. The visualized portions of the organs of the upper abdomen are normal. Musculoskeletal: No chest wall abnormality. No bony spinal canal stenosis. Review of the MIP images confirms the above findings. IMPRESSION: 1. No pulmonary embolus. 2. Moderate pulmonary edema and bilateral dependent atelectasis. 3. Postradiation fibrosis of the medial right hemithorax. 4. Coronary artery and aortic Atherosclerosis (ICD10-I70.0). Electronically Signed   By: Ulyses Jarred M.D.   On: 01/08/2019 05:26   Dg Chest Portable 1 View  Result Date:  01/08/2019 CLINICAL DATA:  Intubation EXAM: PORTABLE CHEST 1 VIEW COMPARISON:  12/17/2018 FINDINGS: The endotracheal tube terminates approximately 2.8 cm above the carina. The enteric tube extends below lead left hemidiaphragm. There is right lung volume loss consistent with the patient's history of a prior right upper lobe lobectomy. Dense consolidation is noted in the right perihilar region which is similar to prior studies. The heart size is stable. Aortic calcifications are again noted. There is no pneumothorax. No significant pleural effusion, however the left costophrenic angle is not fully visualized. IMPRESSION: 1. Lines and tubes as above. 2. Stable appearance of the chest with right-sided volume loss and post radiation changes in the medial right upper lung zone. Electronically Signed   By: Constance Holster M.D.   On: 01/08/2019 03:45    Pending Labs Unresulted Labs (From admission, onward)    Start     Ordered   01/09/19 0500  CBC  Tomorrow morning,   R     01/08/19 0816   01/09/19 9417  Basic metabolic panel  Tomorrow morning,   R     01/08/19 0816   01/09/19 0500  Blood gas, arterial  Tomorrow morning,   R     01/08/19 0816   01/09/19 0500  Magnesium  Tomorrow morning,   R     01/08/19 0816   01/09/19 0500  Phosphorus  Tomorrow morning,   R     01/08/19 0816   01/09/19 0500  Brain natriuretic peptide  Tomorrow morning,   R     01/08/19 0818   01/08/19 0900  Blood gas, arterial  Once,   R     01/08/19 0816   01/08/19 0812  Lactic acid, plasma  STAT Now then every 3 hours,   R (with STAT occurrences)     01/08/19 0816   01/08/19 0812  Culture, respiratory (tracheal aspirate)  Once,   STAT    Question:  Patient immune status  Answer:  Normal   01/08/19 0816   01/08/19 0812  Strep pneumoniae urinary antigen  (not at [facility])  Once,   STAT     01/08/19 0816   01/08/19 0812  Legionella Pneumophila Serogp 1 Ur Ag  Once,   STAT     01/08/19 4081  Vitals/Pain Today's  Vitals   01/08/19 0950 01/08/19 0956 01/08/19 1000 01/08/19 1030  BP: 132/82  113/69 113/73  Pulse: 97  94 88  Resp: (!) 27  (!) 26 20  Temp: 99.4 F (37.4 C)  99.5 F (37.5 C) 99.6 F (37.6 C)  SpO2: 96%  91% 93%  Weight:      Height:      PainSc:  1       Isolation Precautions No active isolations  Medications Medications  norepinephrine (LEVOPHED) 4mg  in 277mL premix infusion (0 mcg/min Intravenous Stopped 01/08/19 0944)  heparin injection 5,000 Units (5,000 Units Subcutaneous Given 01/08/19 0847)  pantoprazole sodium (PROTONIX) 40 mg/20 mL oral suspension 40 mg (has no administration in time range)  0.9 %  sodium chloride infusion ( Intravenous New Bag/Given 01/08/19 0857)  ondansetron (ZOFRAN) injection 4 mg (has no administration in time range)  acetaminophen (TYLENOL) tablet 650 mg (has no administration in time range)  0.9 %  sodium chloride infusion (has no administration in time range)  fentaNYL 2533mcg in NS 246mL (50mcg/ml) infusion-PREMIX (0 mcg/hr Intravenous Stopped 01/08/19 0945)  docusate (COLACE) 50 MG/5ML liquid 100 mg (has no administration in time range)  bisacodyl (DULCOLAX) suppository 10 mg (has no administration in time range)  aspirin chewable tablet 81 mg (has no administration in time range)  rosuvastatin (CRESTOR) tablet 10 mg (has no administration in time range)  ticagrelor (BRILINTA) tablet 90 mg (has no administration in time range)  albuterol (PROVENTIL) (2.5 MG/3ML) 0.083% nebulizer solution 2.5 mg (has no administration in time range)  budesonide (PULMICORT) nebulizer solution 0.25 mg (0.25 mg Nebulization Not Given 01/08/19 1000)  ipratropium-albuterol (DUONEB) 0.5-2.5 (3) MG/3ML nebulizer solution 3 mL (3 mLs Nebulization Not Given 01/08/19 1000)  methylPREDNISolone sodium succinate (SOLU-MEDROL) 125 mg/2 mL injection 60 mg (has no administration in time range)  furosemide (LASIX) injection 40 mg (has no administration in time range)  azithromycin  (ZITHROMAX) 500 mg in sodium chloride 0.9 % 250 mL IVPB (has no administration in time range)  etomidate (AMIDATE) injection (20 mg Intravenous Given 01/08/19 0303)  succinylcholine (ANECTINE) injection (100 mg Intravenous Given 01/08/19 0303)  propofol (DIPRIVAN) 1000 MG/100ML infusion (  Stopped 01/08/19 0328)  fentaNYL (SUBLIMAZE) 100 MCG/2ML injection (100 mcg  Given 01/08/19 0327)  midazolam (VERSED) 2 MG/2ML injection (5 mg  Given 01/08/19 0327)  fentaNYL (SUBLIMAZE) injection 100 mcg (100 mcg Intravenous Given 01/08/19 0424)  iohexol (OMNIPAQUE) 350 MG/ML injection 75 mL (75 mLs Intravenous Contrast Given 01/08/19 0506)  fentaNYL (SUBLIMAZE) 100 MCG/2ML injection (100 mcg  Given 01/08/19 0727)  midazolam (VERSED) 2 MG/2ML injection (5 mg  Given 01/08/19 0728)  fentaNYL (SUBLIMAZE) injection 50 mcg (50 mcg Intravenous Given 01/08/19 0844)    Mobility walks High fall risk   Focused Assessments Cardiac Assessment Handoff:  Cardiac Rhythm: Normal sinus rhythm No results found for: CKTOTAL, CKMB, CKMBINDEX, TROPONINI Lab Results  Component Value Date   DDIMER 2.87 (H) 01/08/2019   Does the Patient currently have chest pain? No  , Neuro Assessment Handoff:  Swallow screen pass? not needed Cardiac Rhythm: Normal sinus rhythm       Neuro Assessment: Within Defined Limits Neuro Checks:      Last Documented NIHSS Modified Score:   Has TPA been given? No If patient is a Neuro Trauma and patient is going to OR before floor call report to [place] nurse: [phone] or [phone]  , Pulmonary Assessment Handoff:  Lung sounds: Bilateral Breath Sounds: Diminished, Rhonchi  O2 Device: Ventilator        R Recommendations: See Admitting Provider Note  Report given to: 36M RN  Additional Notes: see provider

## 2019-01-08 NOTE — ED Notes (Signed)
Troponin 238 reported to this RN by lab.

## 2019-01-08 NOTE — ED Notes (Signed)
Pt now sleeping and resting comfortably.

## 2019-01-08 NOTE — ED Provider Notes (Signed)
[facility] EMERGENCY DEPARTMENT Provider Note   CSN: [id number] Arrival date & time: 01/08/19  0302    History   Chief Complaint Chief Complaint  Patient presents with   Respiratory Arrest    HPI Chelsea Allison is a 63 y.o. female.   The history is provided by the EMS personnel. The history is limited by the condition of the patient (Unresponsive).  She has history of hypertension, hyperlipidemia, coronary artery disease and was brought in following an episode of chest pain.  Apparently, she called 911 complaining of chest pain and stopped responding well on the phone.  EMS had to force entry and found the patient with stertorous respirations and a very slow respiratory rate.  She initially had no gag reflex but attempt to place a [facility] airway were met with a vigorous gag reflex.  The assisted respirations with Ambu bag and nasal trumpet in place.  They were not able to obtain any vital signs in route.  Past Medical History:  Diagnosis Date   CAD (coronary artery disease) 10/20 DES to pRCA, DES mCx    Cancer of lung (HCC)    Twice   Colon cancer ([place])    COPD (chronic obstructive pulmonary disease) ([place])    Hyperlipidemia    Hypertension    Ischemic cardiomyopathy    Neuropathy    Secondary to chemo   STEMI (ST elevation myocardial infarction) ([place]) 12/12/2018   Tobacco abuse     Patient Active Problem List   Diagnosis Date Noted   Hyperlipidemia 12/20/2018   Hypertension 12/20/2018   Pneumonia 12/20/2018   Tobacco abuse 12/20/2018   CAD (coronary artery disease) 12/20/2018   Unstable angina (HCC)    Hypoxemia    Acute respiratory failure with hypoxemia (HCC)    Acute pulmonary edema (HCC)    COPD with acute exacerbation (HCC)    STEMI (ST elevation myocardial infarction) ([place]) 12/12/2018   Acute MI, lateral wall ([place]) 12/12/2018   Acute on chronic respiratory failure with hypercapnia (HCC)    Acute combined systolic and  diastolic heart failure ([place])     Past Surgical History:  Procedure Laterality Date   ABDOMINAL SURGERY     For colon cancer   COLOSTOMY     CORONARY STENT INTERVENTION N/A 12/20/2018   Procedure: CORONARY STENT INTERVENTION - Right;  Surgeon: Burnell Blanks, MD;  Location: [facility];  Service: Cardiovascular;  Laterality: N/A;   CORONARY/GRAFT ACUTE MI REVASCULARIZATION N/A 12/12/2018   Procedure: Coronary/Graft Acute MI Revascularization;  Surgeon: Belva Crome, MD;  Location: [facility];  Service: Cardiovascular;  Laterality: N/A;   LEFT HEART CATH AND CORONARY ANGIOGRAPHY N/A 12/12/2018   Procedure: LEFT HEART CATH AND CORONARY ANGIOGRAPHY;  Surgeon: Belva Crome, MD;  Location: [facility];  Service: Cardiovascular;  Laterality: N/A;     OB History   No obstetric history on file.      Home Medications    Prior to Admission medications   Medication Sig Start Date End Date Taking? Authorizing Provider  ALPRAZolam Duanne Moron) 0.5 MG tablet Take 0.5 mg by mouth 2 (two) times daily as needed for anxiety. 10/24/16   [provider]  aspirin 81 MG chewable tablet Chew 1 tablet (81 mg total) by mouth daily. 12/21/18   Furth, Cadence H, PA-C  dicyclomine (BENTYL) 10 MG capsule Take 10 mg by mouth 3 (three) times daily as needed for cramping. 11/30/18   [provider]  fluticasone furoate-vilanterol (BREO ELLIPTA) 200-25 MCG/INH AEPB Inhale 1 puff into the lungs daily.    [provider]  gabapentin (NEURONTIN) 300 MG capsule Take 300 mg by mouth 3 (three) times daily. 04/27/16   [provider]  ipratropium-albuterol (DUONEB) 0.5-2.5 (3) MG/3ML SOLN Inhale 3 mLs into the lungs every 4 (four) hours as needed for shortness of breath or wheezing. 01/15/16   [provider]  losartan (COZAAR) 25 MG tablet Take 0.5 tablets (12.5 mg total) by mouth daily. 12/20/18   Furth, Cadence H, PA-C  metoprolol succinate  (TOPROL-XL) 25 MG 24 hr tablet Take 1 tablet (25 mg total) by mouth daily. 12/20/18   Furth, Cadence H, PA-C  nitroGLYCERIN (NITROSTAT) 0.4 MG SL tablet Place 1 tablet (0.4 mg total) under the tongue every 5 (five) minutes x 3 doses as needed for chest pain. 12/20/18   Furth, Cadence H, PA-C  rosuvastatin (CRESTOR) 10 MG tablet Take 1 tablet (10 mg total) by mouth daily at 6 PM. 12/20/18   Furth, Cadence H, PA-C  ticagrelor (BRILINTA) 90 MG TABS tablet Take 1 tablet (90 mg total) by mouth 2 (two) times daily. 12/20/18   Furth, Cadence H, PA-C  traMADol (ULTRAM) 50 MG tablet Take 100 mg by mouth every 6 (six) hours as needed for moderate pain.    [provider]  Vitamin D, Ergocalciferol, (DRISDOL) 1.25 MG (50000 UT) CAPS capsule Take 50,000 Units by mouth every 7 (seven) days. 07/04/18   [provider]    Family History Family History  Problem Relation Age of Onset   Hypertension Mother    Hypertension Father     Social History Social History   Tobacco Use   Smoking status: Current Every Day Smoker    Types: Cigarettes   Smokeless tobacco: Never Used  Substance Use Topics   Alcohol use: Never    Frequency: Never   Drug use: Never     Allergies   Hydrochlorothiazide, Hydrocodone, Lisinopril, Oxycodone, and Statins   Review of Systems Review of Systems  Unable to perform ROS: Patient unresponsive     Physical Exam Updated Vital Signs BP (!) 141/78    Pulse 76    Temp 98.6 F (37 C)    Resp 16    Ht '5\' 8"'  (1.727 m)    Wt 84 kg    SpO2 96%    BMI 28.16 kg/m   Physical Exam Vitals signs and nursing note reviewed.    63 year old female, with labored breathing being assisted by Ambu bag ventilation. Vital signs are significant for elevated blood pressure. Oxygen saturation is 96%, which is normal. Head is normocephalic and atraumatic.  Pupils are midposition.  Corneal reflexes intact.  Nasal trumpet is in place. Oropharynx is clear. Neck is  nontender and supple without adenopathy or JVD. Back is nontender and there is no CVA tenderness. Lungs have coarse breath sounds throughout. Chest is nontender. Heart has regular rate and rhythm without murmur. Abdomen is soft, flat, nontender without masses or hepatosplenomegaly and peristalsis is normoactive.  Colostomy present left lower quadrant Extremities have no cyanosis or edema, full range of motion is present. Skin is warm and dry without rash. Neurologic: Unresponsive, but does move all extremities equally.  ED Treatments / Results  Labs (all labs ordered are listed, but only abnormal results are displayed) Labs Reviewed  COMPREHENSIVE METABOLIC PANEL - Abnormal; Notable for the following components:      Result Value   Potassium 5.3 (*)  CO2 18 (*)    Glucose, Bld 266 (*)    Creatinine, Ser 1.26 (*)    Albumin 3.2 (*)    GFR calc non Af Amer 46 (*)    GFR calc Af Amer 53 (*)    All other components within normal limits  CBC WITH DIFFERENTIAL/PLATELET - Abnormal; Notable for the following components:   WBC 13.1 (*)    RBC 5.14 (*)    HCT 47.4 (*)    Platelets 508 (*)    Lymphs Abs 5.6 (*)    Monocytes Absolute 1.2 (*)    Abs Immature Granulocytes 0.16 (*)    All other components within normal limits  D-DIMER, QUANTITATIVE (NOT AT [facility]) - Abnormal; Notable for the following components:   D-Dimer, Quant 2.87 (*)    All other components within normal limits  CBG MONITORING, ED - Abnormal; Notable for the following components:   Glucose-Capillary 193 (*)    All other components within normal limits  POCT I-STAT 7, (LYTES, BLD GAS, ICA,H+H) - Abnormal; Notable for the following components:   pH, Arterial 7.196 (*)    pCO2 arterial 62.3 (*)    pO2, Arterial 422.0 (*)    Acid-base deficit 5.0 (*)    All other components within normal limits  TROPONIN I (HIGH SENSITIVITY) - Abnormal; Notable for the following components:   Troponin I (High Sensitivity) 23 (*)     All other components within normal limits  SARS CORONAVIRUS 2 BY RT PCR (HOSPITAL ORDER, [facility])  BRAIN NATRIURETIC PEPTIDE  I-STAT ARTERIAL BLOOD GAS, ED  TROPONIN I (HIGH SENSITIVITY)    EKG EKG Interpretation  Date/Time:  Sunday January 08 2019 03:06:16 EST Ventricular Rate:  101 PR Interval:    QRS Duration: 116 QT Interval:  335 QTC Calculation: 435 R Axis:   79 Text Interpretation: Sinus tachycardia Ventricular premature complex Probable left atrial enlargement Nonspecific intraventricular conduction delay Inferior infarct, old When compared with ECG of 12/15/2018, Premature ventricular complexes are now present Confirmed by Delora Fuel ([id number]) on 01/08/2019 3:32:01 AM   Radiology Ct Angio Chest Pe W And/or Wo Contrast  Result Date: 01/08/2019 CLINICAL DATA:  Shortness of breath EXAM: CT ANGIOGRAPHY CHEST WITH CONTRAST TECHNIQUE: Multidetector CT imaging of the chest was performed using the standard protocol during bolus administration of intravenous contrast. Multiplanar CT image reconstructions and MIPs were obtained to evaluate the vascular anatomy. CONTRAST:  50m OMNIPAQUE IOHEXOL 350 MG/ML SOLN COMPARISON:  None. FINDINGS: Cardiovascular: --Pulmonary arteries: Contrast injection is sufficient to demonstrate satisfactory opacification of the pulmonary arteries to the segmental level, with attenuation of at least 200 HU at the main pulmonary artery. There is no pulmonary embolus. The main pulmonary artery is within normal limits for size. --Aorta: Limited opacification of the aorta due to bolus timing optimization for the pulmonary arteries. The course and caliber of the aorta are normal. Conventional 3 vessel aortic branching pattern. There is moderate aortic atherosclerosis. --Heart: Normal size. No pericardial effusion. There are coronary artery calcifications. Mediastinum/Nodes: No mediastinal, hilar or axillary lymphadenopathy. The visualized  thyroid and thoracic esophageal course are unremarkable. Lungs/Pleura: There is moderate pulmonary edema with bilateral dependent atelectasis. There is scarring along the medial right hemithorax that has the appearance of radiation fibrosis. There is an 8 mm right middle lobe nodule and an 11 mm left lower lobe nodule. Both of these may be inflammatory. Upper Abdomen: Contrast bolus timing is not optimized for evaluation of the abdominal organs.  The visualized portions of the organs of the upper abdomen are normal. Musculoskeletal: No chest wall abnormality. No bony spinal canal stenosis. Review of the MIP images confirms the above findings. IMPRESSION: 1. No pulmonary embolus. 2. Moderate pulmonary edema and bilateral dependent atelectasis. 3. Postradiation fibrosis of the medial right hemithorax. 4. Coronary artery and aortic Atherosclerosis (ICD10-I70.0). Electronically Signed   By: Ulyses Jarred M.D.   On: 01/08/2019 05:26   Dg Chest Portable 1 View  Result Date: 01/08/2019 CLINICAL DATA:  Intubation EXAM: PORTABLE CHEST 1 VIEW COMPARISON:  12/17/2018 FINDINGS: The endotracheal tube terminates approximately 2.8 cm above the carina. The enteric tube extends below lead left hemidiaphragm. There is right lung volume loss consistent with the patient's history of a prior right upper lobe lobectomy. Dense consolidation is noted in the right perihilar region which is similar to prior studies. The heart size is stable. Aortic calcifications are again noted. There is no pneumothorax. No significant pleural effusion, however the left costophrenic angle is not fully visualized. IMPRESSION: 1. Lines and tubes as above. 2. Stable appearance of the chest with right-sided volume loss and post radiation changes in the medial right upper lung zone. Electronically Signed   By: Constance Holster M.D.   On: 01/08/2019 03:45    Procedures Procedure Name: Intubation Date/Time: 01/08/2019 3:32 AM Performed by: Delora Fuel,  MD Pre-anesthesia Checklist: Patient identified, Patient being monitored, Emergency Drugs available, Timeout performed and Suction available Oxygen Delivery Method: Non-rebreather mask Preoxygenation: Pre-oxygenation with 100% oxygen Induction Type: Rapid sequence Ventilation: Mask ventilation without difficulty Laryngoscope Size: Glidescope and 3 Grade View: Grade I Tube size: 7.5 mm Number of attempts: 1 Airway Equipment and Method: Rigid stylet and Video-laryngoscopy Placement Confirmation: ETT inserted through vocal cords under direct vision,  CO2 detector and Breath sounds checked- equal and bilateral Secured at: 23 cm Tube secured with: ETT holder Dental Injury: Teeth and Oropharynx as per pre-operative assessment        CRITICAL CARE Performed by: Delora Fuel Total critical care time: 95 minutes Critical care time was exclusive of separately billable procedures and treating other patients. Critical care was necessary to treat or prevent imminent or life-threatening deterioration. Critical care was time spent personally by me on the following activities: development of treatment plan with patient and/or surrogate as well as nursing, discussions with consultants, evaluation of patient's response to treatment, examination of patient, obtaining history from patient or surrogate, ordering and performing treatments and interventions, ordering and review of laboratory studies, ordering and review of radiographic studies, pulse oximetry and re-evaluation of patient's condition.  Medications Ordered in ED Medications  fentaNYL (SUBLIMAZE) 100 MCG/2ML injection (has no administration in time range)  midazolam (VERSED) 2 MG/2ML injection (has no administration in time range)  etomidate (AMIDATE) injection (20 mg Intravenous Given 01/08/19 0303)  succinylcholine (ANECTINE) injection (150 mg Intravenous Given 01/08/19 0303)  propofol (DIPRIVAN) 1000 MG/100ML infusion (10 mcg/kg/min  New  Bag/Given 01/08/19 0322)     Initial Impression / Assessment and Plan / ED Course  I have reviewed the triage vital signs and the nursing notes.  Pertinent labs & imaging results that were available during my care of the patient were reviewed by me and considered in my medical decision making (see chart for details).  Chest pain followed by near respiratory arrest.  Patient was intubated on arrival to the ED.  ECG shows no acute process.  Old records are reviewed and do show recent admission for STEMI.  Chest x-ray shows  adequate endotracheal tube placement, no evidence of pneumonia.  Troponin is only minimally elevated.  Will need to get repeat troponin.  Labs show mild renal insufficiency slightly worse than baseline, unclear if this is actually an acute kidney injury.  D-dimer is obtained because of recent hospitalization, and is moderately elevated.  She is sent for CT angiogram of the chest which shows no evidence of pulmonary emboli, probable pulmonary vascular congestion.  Will check BNP.  With mechanical ventilation, patient has become awake and alert.  Case is discussed with Dr. Jimmy Footman of critical care service agrees to admit the patient.  Final Clinical Impressions(s) / ED Diagnoses   Final diagnoses:  Acute on chronic respiratory failure with hypoxia and hypercapnia ([place])  Renal insufficiency  Elevated d-dimer    ED Discharge Orders    None       Delora Fuel, MD 59/96/89 [phone]

## 2019-01-08 NOTE — ED Notes (Signed)
This RN in with pt, pt is awake but appears comfortable. This RN to contact provider to get more medication to sedate pt.

## 2019-01-08 NOTE — ED Notes (Signed)
Critical care provider at bedside. Will place fentanyl drip order and bed order. Pt is comfortable at this time, vss.

## 2019-01-08 NOTE — Progress Notes (Signed)
Pharmacy Antibiotic Note  Chelsea Allison is a 63 y.o. female admitted on 01/08/2019 with pneumonia.  Pharmacy has been consulted for azithromycin dosing.  Has hx of lung cancer and severe COPD, presenting today with hypercarbic respiratory failure requiring intubation. CTA showing mod pulmonary edema and bilateral dependent atelectasis. WBC 13.1>7.6, afeb, LA 0.7, PCT 2.17. QTc 435.   Plan: Azithromycin 500 mg IV every 24 hours  Monitor cx results, clinical pic, and length of therapy   Height: 5\' 8"  (172.7 cm) Weight: 185 lb 3 oz (84 kg) IBW/kg (Calculated) : 63.9  Temp (24hrs), Avg:98.3 F (36.8 C), Min:97.3 F (36.3 C), Max:99.2 F (37.3 C)  Recent Labs  Lab 01/08/19 0306 01/08/19 0836 01/08/19 0839  WBC 13.1* 7.6  --   CREATININE 1.26*  --   --   LATICACIDVEN  --   --  0.7    Estimated Creatinine Clearance: 52.5 mL/min (A) (by C-G formula based on SCr of 1.26 mg/dL (H)).    Allergies  Allergen Reactions  . Hydrochlorothiazide Other (See Comments)    headaches  . Hydrocodone Nausea And Vomiting  . Lisinopril Other (See Comments)    Headache   . Oxycodone Nausea Only  . Statins Other (See Comments)    Antimicrobials this admission: Azithromycin 11/1 >>   Dose adjustments this admission: N/A  Microbiology results: 11/1 Sputum: sent 11/1 COVID: sent    Thank you for allowing pharmacy to be a part of this patient's care.  Antonietta Jewel, PharmD, BCCCP Clinical Pharmacist  Phone: [phone]  Please check AMION for all [facility] phone numbers After 10:00 PM, call [place] [phone] 01/08/2019 9:44 AM

## 2019-01-08 NOTE — Progress Notes (Signed)
Doing well post extubation without BIPAP.  Okay for stepdown bed if we need ICU beds.  Erskine Emery MD

## 2019-01-08 NOTE — Consult Note (Addendum)
Cardiology Consultation:   Patient ID: Chelsea Allison MRN: [id number]; DOB: 05/25/1955  Admit date: 01/08/2019 Date of Consult: 01/08/2019  Primary Care Provider: Beckie Salts, MD Primary Cardiologist: Sinclair Grooms, MD  Primary Electrophysiologist:  None    Patient Profile:   Chelsea Allison is a 63 y.o. female with a history of recent STEMI s/p primary PCI with DES to mid circumflex  and stage PCI with DES to proximal RCA in 12/2018, COPD with continued tobacco use, lung cancer s/p partial right lung resection in 2015, colon cancer with colostomy, neuropathy secondary to chemotherapy, hypertension who is being seen today for the evaluation of syncope and elevated troponin at the request of Eric Form, NP (Critical Care).  History of Present Illness:   Chelsea Allison is a 63 year old female with the above history.  Patient recently admitted from 12/12/2018 to 12/20/2018 with acute STEMI.  Cardiac cath showed acute lateral wall MI due to thrombotic occlusion of the circumflex as well as high-grade obstruction of the proximal RCA with collaterals to the apical LAD and chronic total occlusion of the proximal LAD.  Patient underwent primary PCI to the mid circumflex later staged PCI with DES to the proximal RCA. During initial procedure, patient became progressively hypoxic during procedure and ultimately became unresponsive unresponsive and required intubation.  Etiology unclear but was thought to be due to CO2 narcosis.  Because of hypoxia and elevated LVEDP, an intra-aortic balloon pump was inserted and right heart catheter was placed.  Patient received Aggrastat bolus x18 hours and was also placed on IV Levophed.  Neurology was consulted for acute mental status change.  Stat head CT showed no acute bleed or evolving infarct.  Echo showed LVEF of 35 to 40% with multiple wall motion abnormalities.  Hospitalization complicated by acute pneumonia patient was started on antibiotics.  She also was started on  Solu-Medrol for possible COPD exacerbation.  She was able to be discharged with close follow-up in our office.  Patient was discharged on Aspirin 81 mg daily, Brilinta 90 mg twice daily Losartan 12.5 mg daily, Toprol 25 mg daily, Crestor 10 mg daily.  She was supposed to have a follow-up on 01/03/2019 but it looks like she no showed this.  Patient presented to the ED today via EMS for evaluation of respiratory arrest. Patient reportedly called 911 for shortness of breath and chest pain but became unresponsive while on the phone with a dispatcher.  Per ED note, EMS had to force entry and found the patient very slow respiratory rate.  Initially tried to place a [facility] airway or met with a vigorous gag reflex therefore respirations were assisted with Ambu bag and nasal trumpet.  On arrival to the ED, patient was intubated. Patient hypotensive with BP as low as 59/43.  EKG showed no acute ST/T changes.  High-sensitivity troponin elevated at 23 >> 238.  D-dimer elevated at 2.87.  Elevated at 915.  Chest x-ray showed right-sided volume loss and postradiation changes but no acute findings.  Chest CTA was negative for PE but showed moderate pulmonary edema and bilateral dependent atelectasis.  WBC 13.1, Hgb 14.2, Plts 508. Na 138, K 5.3, Glucose 266, BUN 15, Cr 1.26. LFTs normal.  ABG showed pH of 7.19, pCO2 of 62, pO2 422, Bicarb 24.3.  Admitted for acute hypercarbic espiratory failure.  However, respiratory status is already improved and she was able to be extubated.  Cardiology consulted for evaluation of elevated troponin and setting of recent STEMI.  At the time  of this evaluation, patient recently extubated.  She is a mildly sore throat but was able to speak with me.  She states she was actually feeling well until this morning at approximately 1:30 AM when she woke up with lower jaw pain.  She states she then began to panic and had significant trouble breathing as well as chest discomfort.  She thinks the jaw pain  is due to her teeth.  States that she has had jaw pain for years and that it is worse when she breathes in cold air.  Not worse with exertion.  She states her job pain gets bad it seems to radiate to her chest and she has trouble differentiating two.  This occurred earlier this morning.  She does states chest discomfort is much different than her symptoms with STEMI. She denies having any trouble breathing yesterday but it sounds like she has missed a few doses of her inhaler.  She denies any recent orthopnea, PND, lower extremity edema.  No abnormal bleeding on dual antiplatelet therapy.   Patient continues to smoke; however, she reports that she has cut back significantly. Currently smoking about 4 cigarettes per day. She was previously smoking over 1 pack per day prior to STEMI.   Heart Pathway Score:     Past Medical History:  Diagnosis Date   CAD (coronary artery disease) 10/20 DES to pRCA, DES mCx    Cancer of lung (HCC)    Twice   Colon cancer ([place])    COPD (chronic obstructive pulmonary disease) ([place])    Hyperlipidemia    Hypertension    Ischemic cardiomyopathy    Neuropathy    Secondary to chemo   STEMI (ST elevation myocardial infarction) ([place]) 12/12/2018   Tobacco abuse     Past Surgical History:  Procedure Laterality Date   ABDOMINAL SURGERY     For colon cancer   COLOSTOMY     CORONARY STENT INTERVENTION N/A 12/20/2018   Procedure: CORONARY STENT INTERVENTION - Right;  Surgeon: Burnell Blanks, MD;  Location: [place] [facility];  Service: Cardiovascular;  Laterality: N/A;   CORONARY/GRAFT ACUTE MI REVASCULARIZATION N/A 12/12/2018   Procedure: Coronary/Graft Acute MI Revascularization;  Surgeon: Belva Crome, MD;  Location: [place] [facility];  Service: Cardiovascular;  Laterality: N/A;   LEFT HEART CATH AND CORONARY ANGIOGRAPHY N/A 12/12/2018   Procedure: LEFT HEART CATH AND CORONARY ANGIOGRAPHY;  Surgeon: Belva Crome, MD;  Location: [place]  [facility];  Service: Cardiovascular;  Laterality: N/A;     Home Medications:  Prior to Admission medications   Medication Sig Start Date End Date Taking? Authorizing Provider  albuterol (VENTOLIN HFA) 108 (90 Base) MCG/ACT inhaler Inhale 2 puffs into the lungs every 4 (four) hours as needed for shortness of breath or wheezing. 12/29/18   [provider]  ALPRAZolam Duanne Moron) 0.5 MG tablet Take 0.5 mg by mouth 2 (two) times daily as needed for anxiety. 10/24/16   [provider]  aspirin 81 MG chewable tablet Chew 1 tablet (81 mg total) by mouth daily. 12/21/18   Furth, Cadence H, PA-C  dicyclomine (BENTYL) 10 MG capsule Take 10 mg by mouth 3 (three) times daily as needed for cramping. 11/30/18   [provider]  fluticasone furoate-vilanterol (BREO ELLIPTA) 200-25 MCG/INH AEPB Inhale 1 puff into the lungs daily.    [provider]  gabapentin (NEURONTIN) 300 MG capsule Take 300 mg by mouth 3 (three) times daily. 04/27/16   [provider]  ipratropium-albuterol (DUONEB) 0.5-2.5 (3) MG/3ML SOLN Inhale 3 mLs into the lungs every 4 (four) hours as needed for shortness of breath or wheezing. 01/15/16   [provider]  losartan (COZAAR) 25 MG tablet Take 0.5 tablets (12.5 mg total) by mouth daily. 12/20/18   Furth, Cadence H, PA-C  metoprolol succinate (TOPROL-XL) 25 MG 24 hr tablet Take 1 tablet (25 mg total) by mouth daily. 12/20/18   Furth, Cadence H, PA-C  nitroGLYCERIN (NITROSTAT) 0.4 MG SL tablet Place 1 tablet (0.4 mg total) under the tongue every 5 (five) minutes x 3 doses as needed for chest pain. 12/20/18   Furth, Cadence H, PA-C  rosuvastatin (CRESTOR) 10 MG tablet Take 1 tablet (10 mg total) by mouth daily at 6 PM. 12/20/18   Furth, Cadence H, PA-C  ticagrelor (BRILINTA) 90 MG TABS tablet Take 1 tablet (90 mg total) by mouth 2 (two) times daily. 12/20/18   Furth, Cadence H, PA-C  traMADol (ULTRAM) 50 MG tablet Take 100 mg by mouth every 6 (six)  hours as needed for moderate pain.    [provider]  Vitamin D, Ergocalciferol, (DRISDOL) 1.25 MG (50000 UT) CAPS capsule Take 50,000 Units by mouth every 7 (seven) days. 07/04/18   [provider]    Inpatient Medications: Scheduled Meds:  aspirin  81 mg Oral Daily   budesonide (PULMICORT) nebulizer solution  0.25 mg Nebulization BID   heparin  5,000 Units Subcutaneous Q8H   ipratropium-albuterol  3 mL Nebulization Q6H   pantoprazole sodium  40 mg Per Tube Daily   rosuvastatin  10 mg Oral q1800   ticagrelor  90 mg Oral BID   Continuous Infusions:  sodium chloride 10 mL/hr at 01/08/19 0857   sodium chloride     fentaNYL infusion INTRAVENOUS 50 mcg/hr (01/08/19 0846)   norepinephrine (LEVOPHED) Adult infusion 2.5 mcg/min (01/08/19 0853)   PRN Meds: acetaminophen, albuterol, bisacodyl, docusate, fentaNYL, ondansetron (ZOFRAN) IV  Allergies:    Allergies  Allergen Reactions   Hydrochlorothiazide Other (See Comments)    headaches   Hydrocodone Nausea And Vomiting   Lisinopril Other (See Comments)    Headache    Oxycodone Nausea Only   Statins Other (See Comments)    Social History:   Social History   Socioeconomic History   Marital status: Single    Spouse name: Not on file   Number of children: Not on file   Years of education: Not on file   Highest education level: Not on file  Occupational History   Not on file  Social Needs   Financial resource strain: Not on file   Food insecurity    Worry: Not on file    Inability: Not on file   Transportation needs    Medical: Not on file    Non-medical: Not on file  Tobacco Use   Smoking status: Current Every Day Smoker    Types: Cigarettes   Smokeless tobacco: Never Used  Substance and Sexual Activity   Alcohol use: Never    Frequency: Never   Drug use: Never   Sexual activity: Not on file  Lifestyle   Physical activity    Days per week: Not on file    Minutes per  session: Not on file   Stress: Not on file  Relationships   Social connections    Talks on phone: Not on file    Gets together: Not on file    Attends religious service: Not on file    Active  member of club or organization: Not on file    Attends meetings of clubs or organizations: Not on file    Relationship status: Not on file   Intimate partner violence    Fear of current or ex partner: Not on file    Emotionally abused: Not on file    Physically abused: Not on file    Forced sexual activity: Not on file  Other Topics Concern   Not on file  Social History Narrative   Patient lives in [place].    Family History:    Family History  Problem Relation Age of Onset   Hypertension Mother    Hypertension Father      ROS:  Please see the history of present illness.  All other ROS reviewed and negative.     Physical Exam/Data:   Vitals:   01/08/19 0800 01/08/19 0830 01/08/19 0840 01/08/19 0850  BP: 123/71 129/85 133/75 117/67  Pulse: 73 79 80 80  Resp: (!) 21 (!) _0 Temp: 98.9 F (37.2 C) 99 F (37.2 C) 99.1 F (37.3 C) 99.2 F (37.3 C)  SpO2: 94% 95% 95% 94%  Weight:      Height:        Intake/Output Summary (Last 24 hours) at 01/08/2019 0928 Last data filed at 01/08/2019 0849 Gross per 24 hour  Intake --  Output 750 ml  Net -750 ml   Last 3 Weights 01/08/2019 12/20/2018 12/19/2018  Weight (lbs) 185 lb 3 oz 186 lb 190 lb 7.6 oz  Weight (kg) 84 kg 84.369 kg 86.4 kg     Body mass index is 28.16 kg/m.  General: 63 y.o. female resting comfortably in no acute distress. HEENT: Normocephalic and atraumatic. Sclera clear. EOMs intact. Poor dentition. Neck: Supple.  No JVD. Heart: RRR. Distinct S1 and S2. No murmurs, gallops, or rubs. Radial and distal pedal pulses 2+ and equal bilaterally. Lungs: No increased work of breathing. Clear to ausculation bilaterally. No significant wheezes, rhonchi, or rales.  Abdomen: Soft, non-distended, and non-tender to  palpation. Bowel sounds present. MSK: Normal strength and tone for age. Extremities: No clubbing, cyanosis, or edema.    Skin: Warm and dry. Neuro: Alert and oriented x3. No focal deficits. Psych: Normal affect. Responds appropriately.  EKG:  The EKG was personally reviewed and demonstrates: Mild sinus tachycardia, rate 101 bpm, with PVC, and non-specific ST/T changes but no acute changes compared to prior changes. ST/T changes actually improved some from prior tracings.   Telemetry:  Telemetry was personally reviewed and demonstrates: Normal sinus rhythm rate in the 70s to 110s and PVCs.  Relevant CV Studies:  Left Cardiac Catheterization 12/12/2018:  A stent was successfully placed.    Acute lateral wall myocardial infarction due to thrombotic occlusion of the circumflex.  High-grade obstruction of the proximal RCA with collaterals to the apical LAD  Chronic total occlusion of the proximal LAD.  PTCA and stent implantation proximal to mid circumflex reducing 100% stenosis to less than 10% with TIMI grade III flow.  Final post dual balloon diameter 2.83 mm using A 22 x 2.75 Onyx.  No postdilatation was performed.  Mid anterior wall akinesis with anteroapical hypokinesis.  LVEDP 25 mmHg.  Mean pulmonary capillary wedge pressure 7 mmHg, PA pressure 24 over 12 mmHg.  Intra-aortic balloon pump at 1:1 pumping.  Altered mental status prior to intubation.  Etiology is unclear but likely related to CO2 narcosis.  Rule out intracranial hemorrhage/bleed in the setting of intense anticoagulation.  Recommendations:  Fluid bolus to increase capillary wedge pressure to 15 mmHg.  IV Levophed to support systolic blood pressure above 100 mmHg.  One-to-one intra-aortic balloon pump counterpulsation.  Aggrastat x18 hours  Load with Brilinta slurry.  Critical care to help manage acute hypoxic/hypercarbic respiratory failure requiring intubation in the Cath Lab (PCO2 was 81 on arterial  blood gas prior to intubation).  Preventive therapy including high-dose statin therapy  If the patient has not had an intracranial bleed or CVA.  Will need pulmonary/critical care help with managing ventilator.  Needs a neurology consultation.  This would be requested because the patient's mental status acutely changed in the Cath Lab prior to intubation. _______________  Echocardiogram 12/12/2018: Impressions:  1. Technically difficult study with limited views. Left ventricular ejection fraction, by visual estimation, is 35 to 40%. Hypokinesis of apex, anterior/septal/lateral apical walls, mid to basal inferolateral wall  2. Global right ventricle has normal systolic function.The right ventricular size is normal. _______________  Coronary Stent Intervention 12/20/2018:  Prox RCA lesion is 80% stenosed.  A drug-eluting stent was successfully placed using a STENT RESOLUTE ONYX 3.0X12.  Post intervention, there is a 0% residual stenosis.   1. Severe stenosis proximal RCA 2. Successful PTCA/DES x 1 proximal RCA  Continue ASA/Brilinta/statin and beta blocker. DAPT for one year.    Laboratory Data:  High Sensitivity Troponin:   Recent Labs  Lab 12/12/18 0657 12/12/18 1132 01/08/19 0306 01/08/19 0615  TROPONINIHS 65* >27,000* 23* 238*     Chemistry Recent Labs  Lab 01/08/19 0306 01/08/19 0439 01/08/19 0837  NA 138 140 139  K 5.3* 4.3 4.4  CL 105  --   --   CO2 18*  --   --   GLUCOSE 266*  --   --   BUN 15  --   --   CREATININE 1.26*  --   --   CALCIUM 9.3  --   --   GFRNONAA 46*  --   --   GFRAA 53*  --   --   ANIONGAP 15  --   --     Recent Labs  Lab 01/08/19 0306  PROT 6.8  ALBUMIN 3.2*  AST 27  ALT 12  ALKPHOS 89  BILITOT 0.6   Hematology Recent Labs  Lab 01/08/19 0306 01/08/19 0439 01/08/19 0836 01/08/19 0837  WBC 13.1*  --  7.6  --   RBC 5.14*  --  4.70  --   HGB 14.2 12.9 12.8 12.9  HCT 47.4* 38.0 42.4 38.0  MCV 92.2  --  90.2  --     MCH 27.6  --  27.2  --   MCHC 30.0  --  30.2  --   RDW 14.9  --  15.3  --   PLT 508*  --  396  --    BNP Recent Labs  Lab 01/08/19 0306  BNP 915.6*    DDimer  Recent Labs  Lab 01/08/19 0306  DDIMER 2.87*     Radiology/Studies:  Ct Angio Chest Pe W And/or Wo Contrast  Result Date: 01/08/2019 CLINICAL DATA:  Shortness of breath EXAM: CT ANGIOGRAPHY CHEST WITH CONTRAST TECHNIQUE: Multidetector CT imaging of the chest was performed using the standard protocol during bolus administration of intravenous contrast. Multiplanar CT image reconstructions and MIPs were obtained to evaluate the vascular anatomy. CONTRAST:  71m OMNIPAQUE IOHEXOL 350 MG/ML SOLN COMPARISON:  None. FINDINGS: Cardiovascular: --Pulmonary arteries: Contrast injection is sufficient to demonstrate satisfactory opacification of the pulmonary  arteries to the segmental level, with attenuation of at least 200 HU at the main pulmonary artery. There is no pulmonary embolus. The main pulmonary artery is within normal limits for size. --Aorta: Limited opacification of the aorta due to bolus timing optimization for the pulmonary arteries. The course and caliber of the aorta are normal. Conventional 3 vessel aortic branching pattern. There is moderate aortic atherosclerosis. --Heart: Normal size. No pericardial effusion. There are coronary artery calcifications. Mediastinum/Nodes: No mediastinal, hilar or axillary lymphadenopathy. The visualized thyroid and thoracic esophageal course are unremarkable. Lungs/Pleura: There is moderate pulmonary edema with bilateral dependent atelectasis. There is scarring along the medial right hemithorax that has the appearance of radiation fibrosis. There is an 8 mm right middle lobe nodule and an 11 mm left lower lobe nodule. Both of these may be inflammatory. Upper Abdomen: Contrast bolus timing is not optimized for evaluation of the abdominal organs. The visualized portions of the organs of the upper  abdomen are normal. Musculoskeletal: No chest wall abnormality. No bony spinal canal stenosis. Review of the MIP images confirms the above findings. IMPRESSION: 1. No pulmonary embolus. 2. Moderate pulmonary edema and bilateral dependent atelectasis. 3. Postradiation fibrosis of the medial right hemithorax. 4. Coronary artery and aortic Atherosclerosis (ICD10-I70.0). Electronically Signed   By: Ulyses Jarred M.D.   On: 01/08/2019 05:26   Dg Chest Portable 1 View  Result Date: 01/08/2019 CLINICAL DATA:  Intubation EXAM: PORTABLE CHEST 1 VIEW COMPARISON:  12/17/2018 FINDINGS: The endotracheal tube terminates approximately 2.8 cm above the carina. The enteric tube extends below lead left hemidiaphragm. There is right lung volume loss consistent with the patient's history of a prior right upper lobe lobectomy. Dense consolidation is noted in the right perihilar region which is similar to prior studies. The heart size is stable. Aortic calcifications are again noted. There is no pneumothorax. No significant pleural effusion, however the left costophrenic angle is not fully visualized. IMPRESSION: 1. Lines and tubes as above. 2. Stable appearance of the chest with right-sided volume loss and post radiation changes in the medial right upper lung zone. Electronically Signed   By: Constance Holster M.D.   On: 01/08/2019 03:45    Assessment and Plan:   Acute Respiratory Failure / History of Severe COPD - ABG showed pH of 7.19, pCO2 of 62, pO2 422, Bicarb 24.3. - Briefly requiring intubation but has since been extubated and is now on nasal cannula. - D-dimer elevated at 2.87. Chest CTA negative for PE. - COVID-19 negative. - Patient has history of COPD and lung cancer.  - Management per PCCM.  Acute on Chronic Systolic CHF/ Ischemic Cardiomyopathy - BNP elevated at 915. - Chest CTA showed moderate pulmonary edema and bilateral dependent atelectasis. - Most recent Echo showed LVEF of 40-45% with wall motion  abnormalities from acute MI. - Patient was started on IV Lasix 73m every 8 hours. Patient not on any Lasix at home and does not appear significantly volume overloaded on exam so will give 2 doses of IV Lasix 481mtoday and reassess tomorrow. - Continue to hold home Losartan and Toprol as patient hypotensive in the ED briefly requiring pressors.  - Monitor daily weights, strict I/O's, and renal function.  Elevated Troponin with Recent STEMI - Patient recently admitted with STEMI from 12/12/2018 to 12/20/2018. S/p PCI/DES to mid CX and proximal RCA. - EKG showed no acute ST/T changes. - Patient did have some vague chest discomfort with shortness of breath prior to presentation but states  it felt very different than symptoms prior to MI. She also reports intermittent lower jaw pain which is likely related to poor dentition.  - High-sensitivity troponin mildly elevated at 23 >> 238.  - Suspect demand ischemia in the setting of acute respiratory failure. Would expect EKG changes and higher troponin if there was restenosis of stent. - Continue dual antiplatelet therapy with Aspirin and Brilinta. Continue home statin.  Hypotensive - Required Levophed in the ED for a couple of hours but it looks like this has now been stopped. Most recent BP 113/73.  - OK to gradually restart home medications if BP remains stable.  Mild AKI - Creatinine 1.26 on admission. Possibly due to hypotension. - Recheck BMET in the morning.  Hyperkalemia - Potassium mildly elevated at 5.3.  - Magnesium pending. - Continue to monitor.  Otherwise, per primary team. - Leukocytosis - COPD - History of lung cancer s/p right lung resection in 2015 - Colon cancer  For questions or updates, please contact [facility] Please consult [email or web address] for contact info under     Signed, Darreld Mclean, PA-C  01/08/2019 9:28 AM  Attending Note:   The patient was seen and examined.  Agree with assessment and plan as  noted above.  Changes made to the above note as needed.  Patient seen and independently examined with  Chelsea Rives, PA .   We discussed all aspects of the encounter. I agree with the assessment and plan as stated above.  1.  Respiratory failure: Patient has severe COPD and also has had lung cancer.  She has been having trouble breathing recently.  She had severe respiratory distress this morning and called EMS.  She then passed out.  EMS had to break down her door.  She was intubated initially in the emergency room but but improved significantly and now has been extubated. She recalls having some chest pain but her chest pains were atypical and were not at all similar to her presenting episodes of chest pain last month when she had a myocardial infarction.  Continue further plans per pulmonary critical care team.  2.  Coronary artery disease: She has a history of STEMI last month.  She has had several stents placed.  Her troponin levels are minimally elevated today.  I would think that if she had acute closure of one of the stents that her troponin levels would be markedly higher than she would have EKG changes.  I suspect that her minimal troponin elevations are due to demand ischemia.  I doubt that she has acute or subacute stenosis of her stents.  We will continue to follow along.  She continues to smoke but states that she is trying to cut back.   I have spent a total of 40 minutes with patient reviewing hospital  notes , telemetry, EKGs, labs and examining patient as well as establishing an assessment and plan that was discussed with the patient. > 50% of time was spent in direct patient care.    Thayer Headings, Brooke Bonito., MD, [facility] 01/08/2019, 1:13 PM [address]  [place] Pager [phone]

## 2019-01-08 NOTE — ED Notes (Signed)
Dr Roxanne Mins gave verbal order for 5mg  versed, and 142mcg fentanyl.

## 2019-01-08 NOTE — Progress Notes (Signed)
Patient was transported to CT and back without complications.

## 2019-01-08 NOTE — Code Documentation (Signed)
MD Roxanne Mins attempting intubation at bedside w/ glidescope. Successful intubation w/ 7.5 ETT 23@ lip. Equal blt breath sounds, good color change.

## 2019-01-09 ENCOUNTER — Inpatient Hospital Stay (HOSPITAL_COMMUNITY): Payer: Medicare HMO

## 2019-01-09 DIAGNOSIS — J441 Chronic obstructive pulmonary disease with (acute) exacerbation: Secondary | ICD-10-CM

## 2019-01-09 DIAGNOSIS — I2510 Atherosclerotic heart disease of native coronary artery without angina pectoris: Secondary | ICD-10-CM

## 2019-01-09 DIAGNOSIS — N179 Acute kidney failure, unspecified: Secondary | ICD-10-CM

## 2019-01-09 DIAGNOSIS — I2583 Coronary atherosclerosis due to lipid rich plaque: Secondary | ICD-10-CM

## 2019-01-09 DIAGNOSIS — I249 Acute ischemic heart disease, unspecified: Secondary | ICD-10-CM

## 2019-01-09 DIAGNOSIS — R0789 Other chest pain: Secondary | ICD-10-CM

## 2019-01-09 DIAGNOSIS — I248 Other forms of acute ischemic heart disease: Secondary | ICD-10-CM

## 2019-01-09 DIAGNOSIS — E7800 Pure hypercholesterolemia, unspecified: Secondary | ICD-10-CM

## 2019-01-09 DIAGNOSIS — I5023 Acute on chronic systolic (congestive) heart failure: Secondary | ICD-10-CM

## 2019-01-09 DIAGNOSIS — Z955 Presence of coronary angioplasty implant and graft: Secondary | ICD-10-CM

## 2019-01-09 LAB — GLUCOSE, CAPILLARY
Glucose-Capillary: 237 mg/dL — ABNORMAL HIGH (ref 70–99)
Glucose-Capillary: 266 mg/dL — ABNORMAL HIGH (ref 70–99)
Glucose-Capillary: 138 mg/dL — ABNORMAL HIGH (ref 70–99)
Glucose-Capillary: 193 mg/dL — ABNORMAL HIGH (ref 70–99)
Glucose-Capillary: 183 mg/dL — ABNORMAL HIGH (ref 70–99)

## 2019-01-09 LAB — PHOSPHORUS: Phosphorus: 3.4 mg/dL (ref 2.5–4.6)

## 2019-01-09 LAB — BASIC METABOLIC PANEL
Anion gap: 13 (ref 5–15)
BUN: 22 mg/dL (ref 8–23)
CO2: 23 mmol/L (ref 22–32)
Calcium: 9.5 mg/dL (ref 8.9–10.3)
Chloride: 102 mmol/L (ref 98–111)
Creatinine, Ser: 0.96 mg/dL (ref 0.44–1.00)
GFR calc Af Amer: >60 mL/min (ref >60)
GFR calc non Af Amer: >60 mL/min (ref >60)
Glucose, Bld: 172 mg/dL — ABNORMAL HIGH (ref 70–99)
Potassium: 3.3 mmol/L — ABNORMAL LOW (ref 3.5–5.1)
Sodium: 138 mmol/L (ref 135–145)

## 2019-01-09 LAB — CBC
HCT: 40.1 % (ref 36.0–46.0)
Hemoglobin: 12.8 g/dL (ref 12.0–15.0)
MCH: 27.3 pg (ref 26.0–34.0)
MCHC: 31.9 g/dL (ref 30.0–36.0)
MCV: 85.5 fL (ref 80.0–100.0)
Platelets: 396 10*3/uL (ref 150–400)
RBC: 4.69 MIL/uL (ref 3.87–5.11)
RDW: 15.2 % (ref 11.5–15.5)
WBC: 8.7 10*3/uL (ref 4.0–10.5)
nRBC: 0 % (ref 0.0–0.2)

## 2019-01-09 LAB — ECHOCARDIOGRAM LIMITED
Height: 68 in
Weight: 2970.04 oz

## 2019-01-09 LAB — LEGIONELLA PNEUMOPHILA SEROGP 1 UR AG: L. pneumophila Serogp 1 Ur Ag: NEGATIVE

## 2019-01-09 LAB — TROPONIN I (HIGH SENSITIVITY): Troponin I (High Sensitivity): 154 ng/L (ref <18)

## 2019-01-09 LAB — MAGNESIUM: Magnesium: 2 mg/dL (ref 1.7–2.4)

## 2019-01-09 LAB — BRAIN NATRIURETIC PEPTIDE: B Natriuretic Peptide: 818.4 pg/mL — ABNORMAL HIGH (ref 0.0–100.0)

## 2019-01-09 MED ORDER — ALUM & MAG HYDROXIDE-SIMETH 200-200-20 MG/5ML PO SUSP
30.0000 mL | Freq: Once | ORAL | Status: AC
  Administered 2019-01-09: 30 mL via ORAL
  Filled 2019-01-09: qty 30

## 2019-01-09 MED ORDER — LEVALBUTEROL HCL 0.63 MG/3ML IN NEBU
0.6300 mg | INHALATION_SOLUTION | Freq: Three times a day (TID) | RESPIRATORY_TRACT | Status: DC
  Administered 2019-01-09: 0.63 mg via RESPIRATORY_TRACT

## 2019-01-09 MED ORDER — METHYLPREDNISOLONE SODIUM SUCC 125 MG IJ SOLR
60.0000 mg | Freq: Every day | INTRAMUSCULAR | Status: DC
  Administered 2019-01-10: 60 mg via INTRAVENOUS
  Filled 2019-01-09: qty 2

## 2019-01-09 MED ORDER — FENTANYL CITRATE (PF) 100 MCG/2ML IJ SOLN
25.0000 ug | INTRAMUSCULAR | Status: DC | PRN
  Administered 2019-01-09 (×2): 25 ug via INTRAVENOUS
  Filled 2019-01-09 (×2): qty 2

## 2019-01-09 MED ORDER — IPRATROPIUM BROMIDE 0.02 % IN SOLN
0.5000 mg | Freq: Three times a day (TID) | RESPIRATORY_TRACT | Status: DC
  Administered 2019-01-10 – 2019-01-11 (×4): 0.5 mg via RESPIRATORY_TRACT
  Filled 2019-01-09 (×4): qty 2.5

## 2019-01-09 MED ORDER — LEVALBUTEROL HCL 0.63 MG/3ML IN NEBU
0.6300 mg | INHALATION_SOLUTION | Freq: Three times a day (TID) | RESPIRATORY_TRACT | Status: DC
  Administered 2019-01-10 – 2019-01-11 (×4): 0.63 mg via RESPIRATORY_TRACT
  Filled 2019-01-09 (×4): qty 3

## 2019-01-09 MED ORDER — AZITHROMYCIN 250 MG PO TABS
250.0000 mg | ORAL_TABLET | Freq: Every day | ORAL | Status: DC
  Administered 2019-01-10 – 2019-01-11 (×2): 250 mg via ORAL
  Filled 2019-01-09 (×2): qty 1

## 2019-01-09 MED ORDER — PERFLUTREN LIPID MICROSPHERE
1.0000 mL | INTRAVENOUS | Status: AC | PRN
  Administered 2019-01-09: 2 mL via INTRAVENOUS
  Filled 2019-01-09: qty 10

## 2019-01-09 MED ORDER — ALUM & MAG HYDROXIDE-SIMETH 200-200-20 MG/5ML PO SUSP: 15.0000 mL | Freq: Four times a day (QID) | ORAL | Status: DC | PRN

## 2019-01-09 MED ORDER — POTASSIUM CHLORIDE CRYS ER 20 MEQ PO TBCR
20.0000 meq | EXTENDED_RELEASE_TABLET | Freq: Every day | ORAL | Status: DC
  Administered 2019-01-10 – 2019-01-11 (×2): 20 meq via ORAL
  Filled 2019-01-09 (×2): qty 1

## 2019-01-09 MED ORDER — PHENOL 1.4 % MT LIQD
1.0000 | OROMUCOSAL | Status: DC | PRN
  Administered 2019-01-09: 1 via OROMUCOSAL
  Filled 2019-01-09: qty 177

## 2019-01-09 MED ORDER — INSULIN ASPART 100 UNIT/ML ~~LOC~~ SOLN
0.0000 [IU] | Freq: Three times a day (TID) | SUBCUTANEOUS | Status: DC
  Administered 2019-01-09: 3 [IU] via SUBCUTANEOUS
  Administered 2019-01-09: 8 [IU] via SUBCUTANEOUS
  Administered 2019-01-10: 5 [IU] via SUBCUTANEOUS
  Administered 2019-01-11: 3 [IU] via SUBCUTANEOUS

## 2019-01-09 MED ORDER — ALBUTEROL SULFATE (2.5 MG/3ML) 0.083% IN NEBU
2.5000 mg | INHALATION_SOLUTION | RESPIRATORY_TRACT | Status: DC | PRN
  Administered 2019-01-10: 2.5 mg via RESPIRATORY_TRACT
  Filled 2019-01-09: qty 3

## 2019-01-09 MED ORDER — KETOROLAC TROMETHAMINE 30 MG/ML IJ SOLN
30.0000 mg | Freq: Once | INTRAMUSCULAR | Status: AC
  Administered 2019-01-10: 30 mg via INTRAVENOUS
  Filled 2019-01-09: qty 1

## 2019-01-09 MED ORDER — INSULIN GLARGINE 100 UNIT/ML ~~LOC~~ SOLN
7.0000 [IU] | Freq: Every day | SUBCUTANEOUS | Status: DC
  Administered 2019-01-09 – 2019-01-10 (×2): 7 [IU] via SUBCUTANEOUS
  Filled 2019-01-09 (×3): qty 0.07

## 2019-01-09 MED ORDER — FUROSEMIDE 40 MG PO TABS
40.0000 mg | ORAL_TABLET | Freq: Every day | ORAL | Status: DC
  Administered 2019-01-10 – 2019-01-11 (×2): 40 mg via ORAL
  Filled 2019-01-09 (×3): qty 1

## 2019-01-09 MED ORDER — INSULIN ASPART 100 UNIT/ML ~~LOC~~ SOLN: 0.0000 [IU] | Freq: Every day | SUBCUTANEOUS | Status: DC

## 2019-01-09 MED ORDER — IPRATROPIUM-ALBUTEROL 0.5-2.5 (3) MG/3ML IN SOLN
3.0000 mL | Freq: Four times a day (QID) | RESPIRATORY_TRACT | Status: DC
  Administered 2019-01-09 (×2): 3 mL via RESPIRATORY_TRACT
  Filled 2019-01-09 (×3): qty 3

## 2019-01-09 MED ORDER — AZITHROMYCIN 250 MG PO TABS: 250.0000 mg | ORAL_TABLET | Freq: Every day | ORAL | Status: DC

## 2019-01-09 MED ORDER — AZITHROMYCIN 500 MG PO TABS: 500.0000 mg | ORAL_TABLET | Freq: Every day | ORAL | Status: DC

## 2019-01-09 MED ORDER — IPRATROPIUM-ALBUTEROL 0.5-2.5 (3) MG/3ML IN SOLN: 3.0000 mL | Freq: Four times a day (QID) | RESPIRATORY_TRACT | Status: DC | PRN

## 2019-01-09 MED ORDER — IPRATROPIUM BROMIDE 0.02 % IN SOLN
0.5000 mg | Freq: Three times a day (TID) | RESPIRATORY_TRACT | Status: DC
  Administered 2019-01-09: 0.5 mg via RESPIRATORY_TRACT
  Filled 2019-01-09: qty 2.5

## 2019-01-09 MED ORDER — FUROSEMIDE 10 MG/ML IJ SOLN
40.0000 mg | Freq: Once | INTRAMUSCULAR | Status: AC
  Administered 2019-01-09: 40 mg via INTRAVENOUS
  Filled 2019-01-09: qty 4

## 2019-01-09 MED ORDER — POTASSIUM CHLORIDE CRYS ER 20 MEQ PO TBCR
40.0000 meq | EXTENDED_RELEASE_TABLET | Freq: Two times a day (BID) | ORAL | Status: AC
  Administered 2019-01-09 (×2): 40 meq via ORAL
  Filled 2019-01-09 (×2): qty 2

## 2019-01-09 MED ORDER — BENZOCAINE 10 % MT GEL
Freq: Three times a day (TID) | OROMUCOSAL | Status: DC | PRN
  Administered 2019-01-10: 06:00:00 via OROMUCOSAL
  Filled 2019-01-09: qty 9

## 2019-01-09 NOTE — Progress Notes (Signed)
NURSING PROGRESS NOTE  [place] Student [id number] Transfer Data: 01/09/2019 5:15 PM Attending Provider: Edwin Dada, * YQI:[id number], Ronalee Belts, MD Code Status: full code   Chelsea Allison is a 63 y.o. female patient transferred from 14M to 2W  -No acute distress noted.  -No complaints of shortness of breath.  -No complaints of chest pain.    Blood pressure 135/77, pulse (!) 124, temperature 99.3 F (37.4 C), resp. rate (!) 22, height 5\' 8"  (1.727 m), weight 84.2 kg, SpO2 94 %.   Allergies:  Hydrochlorothiazide, Hydrocodone, Lisinopril, Oxycodone, and Statins  Past Medical History:   has a past medical history of CAD (coronary artery disease) 10/20 DES to pRCA, DES mCx, Cancer of lung ([place]), Colon cancer ([place]), COPD (chronic obstructive pulmonary disease) ([place]), Hyperlipidemia, Hypertension, Ischemic cardiomyopathy, Neuropathy, STEMI (ST elevation myocardial infarction) ([place]) (12/12/2018), and Tobacco abuse.  Past Surgical History:   has a past surgical history that includes Colostomy; Abdominal surgery; LEFT HEART CATH AND CORONARY ANGIOGRAPHY (N/A, 12/12/2018); Coronary/Graft Acute MI Revascularization (N/A, 12/12/2018); and CORONARY STENT INTERVENTION (N/A, 12/20/2018).  Social History:   reports that she has been smoking cigarettes. She has never used smokeless tobacco. She reports that she does not drink alcohol or use drugs.   Report given to [facility]

## 2019-01-09 NOTE — Progress Notes (Signed)
Pt started bleeding from old heparin site & new - gauze dressing applied.

## 2019-01-09 NOTE — Progress Notes (Signed)
   01/09/19 1300  Vitals  Temp 99 F (37.2 C)  BP (!) 115/58  MAP (mmHg) 75  Pulse Rate (!) 124  ECG Heart Rate (!) 124  Resp (!) 31  Oxygen Therapy  SpO2 92 %    Patient having tremors, upset stomach which was relieved by GI cocktail, and increased anxiety. Nurse asked about alcohol at home, patient denied any use, education provided as to why she was asked. Provider made aware. Update- 3:10PM- patient reports having a 7/10 headache, seeing baby faces and buildings in artwork on wall, framed art work is of roses. Tylenol given, provider made aware and came bedside.      01/09/19 1534  Vitals  Temp 99.3 F (37.4 C)  BP 122/78  MAP (mmHg) 88  Pulse Rate (!) 114  ECG Heart Rate (!) 121  Resp (!) 29  Oxygen Therapy  SpO2 93 %  Will continue to monitor.

## 2019-01-09 NOTE — Progress Notes (Signed)
Progress Note  Patient Name: Chelsea Allison Date of Encounter: 01/09/2019  Primary Cardiologist: Sinclair Grooms, MD   Subjective   Complains of severe pain in her chest radiating into her neck. Has broken teeth and poor dentition.  She wonders if her pain is related to her teeth.  She also wonders if it is related to a mass she was told she had behind her sternum.  Inpatient Medications    Scheduled Meds:  aspirin  81 mg Oral Daily   budesonide (PULMICORT) nebulizer solution  0.25 mg Nebulization BID   Chlorhexidine Gluconate Cloth  6 each Topical Daily   heparin  5,000 Units Subcutaneous Q8H   ipratropium-albuterol  3 mL Nebulization QID   methylPREDNISolone (SOLU-MEDROL) injection  60 mg Intravenous Q6H   pantoprazole  40 mg Oral Daily   rosuvastatin  10 mg Oral q1800   ticagrelor  90 mg Oral BID   Continuous Infusions:  sodium chloride 10 mL/hr at 01/09/19 0500   sodium chloride     azithromycin Stopped (01/08/19 1316)   PRN Meds: acetaminophen, albuterol, ALPRAZolam, bisacodyl, docusate sodium, ondansetron (ZOFRAN) IV, traMADol   Vital Signs    Vitals:   01/09/19 0500 01/09/19 0600 01/09/19 0700 01/09/19 0758  BP: (!) 140/57 117/73 112/66   Pulse: 100 (!) 102 97   Resp: (!) 23 (!) 25 20   Temp: 98.4 F (36.9 C) 98.6 F (37 C) 98.4 F (36.9 C) 97.6 F (36.4 C)  TempSrc:    Axillary  SpO2: 97% 95% 95%   Weight:      Height:        Intake/Output Summary (Last 24 hours) at 01/09/2019 0946 Last data filed at 01/09/2019 0800 Gross per 24 hour  Intake 845.92 ml  Output 4760 ml  Net -3914.08 ml   Last 3 Weights 01/09/2019 01/08/2019 12/20/2018  Weight (lbs) 185 lb 10 oz 185 lb 3 oz 186 lb  Weight (kg) 84.2 kg 84 kg 84.369 kg      Telemetry    Sinus tachycardia.  PVCs.  Ventricular bigeminy.- Personally Reviewed  ECG    Sinus tachycardia.  Rate 101 bpm.  Low voltage precordial leads.  Prior inferior infarct.  Nonspecific ST changes.-  Personally Reviewed  Physical Exam   VS:  BP 116/61    Pulse (!) 111    Temp 98.8 F (37.1 C)    Resp (!) 22    Ht 5\' 8"  (1.727 m)    Wt 84.2 kg    SpO2 96%    BMI 28.22 kg/m  , BMI Body mass index is 28.22 kg/m. GENERAL:  Well appearing.  Mild distress. HEENT: Pupils equal round and reactive, fundi not visualized, oral mucosa unremarkable NECK:  No jugular venous distention, waveform within normal limits, carotid upstroke brisk and symmetric, no bruits CHEST: chest wall TTP LUNGS:  Expiratory wheezing on anterior exam HEART:  RRR.  PMI not displaced or sustained,S1 and S2 within normal limits, no S3, no S4, no clicks, no rubs, no murmurs ABD:  Flat, positive bowel sounds normal in frequency in pitch, no bruits, no rebound, no guarding, no midline pulsatile mass, no hepatomegaly, no splenomegaly EXT:  2 plus pulses throughout, no edema, no cyanosis no clubbing SKIN:  No rashes no nodules NEURO:  Cranial nerves II through XII grossly intact, motor grossly intact throughout PSYCH:  Cognitively intact, oriented to person place and time   Labs    High Sensitivity Troponin:   Recent Labs  Lab 12/12/18 0657 12/12/18 1132 01/08/19 0306 01/08/19 0615 01/09/19 0549  TROPONINIHS 65* >27,000* 23* 238* 154*      Chemistry Recent Labs  Lab 01/08/19 0306 01/08/19 0439 01/08/19 0836 01/08/19 0837 01/09/19 0549  NA 138 140  --  139 138  K 5.3* 4.3  --  4.4 3.3*  CL 105  --   --   --  102  CO2 18*  --   --   --  23  GLUCOSE 266*  --   --   --  172*  BUN 15  --   --   --  22  CREATININE 1.26*  --  0.97  --  0.96  CALCIUM 9.3  --   --   --  9.5  PROT 6.8  --   --   --   --   ALBUMIN 3.2*  --   --   --   --   AST 27  --   --   --   --   ALT 12  --   --   --   --   ALKPHOS 89  --   --   --   --   BILITOT 0.6  --   --   --   --   GFRNONAA 46*  --  >60  --  >60  GFRAA 53*  --  >60  --  >60  ANIONGAP 15  --   --   --  13     Hematology Recent Labs  Lab 01/08/19 0306   01/08/19 0836 01/08/19 0837 01/09/19 0549  WBC 13.1*  --  7.6  --  8.7  RBC 5.14*  --  4.70  --  4.69  HGB 14.2   < > 12.8 12.9 12.8  HCT 47.4*   < > 42.4 38.0 40.1  MCV 92.2  --  90.2  --  85.5  MCH 27.6  --  27.2  --  27.3  MCHC 30.0  --  30.2  --  31.9  RDW 14.9  --  15.3  --  15.2  PLT 508*  --  396  --  396   < > = values in this interval not displayed.    BNP Recent Labs  Lab 01/08/19 0306 01/09/19 0549  BNP 915.6* 818.4*     DDimer  Recent Labs  Lab 01/08/19 0306  DDIMER 2.87*     Radiology    Ct Angio Chest Pe W And/or Wo Contrast  Result Date: 01/08/2019 CLINICAL DATA:  Shortness of breath EXAM: CT ANGIOGRAPHY CHEST WITH CONTRAST TECHNIQUE: Multidetector CT imaging of the chest was performed using the standard protocol during bolus administration of intravenous contrast. Multiplanar CT image reconstructions and MIPs were obtained to evaluate the vascular anatomy. CONTRAST:  36mL OMNIPAQUE IOHEXOL 350 MG/ML SOLN COMPARISON:  None. FINDINGS: Cardiovascular: --Pulmonary arteries: Contrast injection is sufficient to demonstrate satisfactory opacification of the pulmonary arteries to the segmental level, with attenuation of at least 200 HU at the main pulmonary artery. There is no pulmonary embolus. The main pulmonary artery is within normal limits for size. --Aorta: Limited opacification of the aorta due to bolus timing optimization for the pulmonary arteries. The course and caliber of the aorta are normal. Conventional 3 vessel aortic branching pattern. There is moderate aortic atherosclerosis. --Heart: Normal size. No pericardial effusion. There are coronary artery calcifications. Mediastinum/Nodes: No mediastinal, hilar or axillary lymphadenopathy. The visualized thyroid and thoracic esophageal course are unremarkable. Lungs/Pleura:  There is moderate pulmonary edema with bilateral dependent atelectasis. There is scarring along the medial right hemithorax that has the  appearance of radiation fibrosis. There is an 8 mm right middle lobe nodule and an 11 mm left lower lobe nodule. Both of these may be inflammatory. Upper Abdomen: Contrast bolus timing is not optimized for evaluation of the abdominal organs. The visualized portions of the organs of the upper abdomen are normal. Musculoskeletal: No chest wall abnormality. No bony spinal canal stenosis. Review of the MIP images confirms the above findings. IMPRESSION: 1. No pulmonary embolus. 2. Moderate pulmonary edema and bilateral dependent atelectasis. 3. Postradiation fibrosis of the medial right hemithorax. 4. Coronary artery and aortic Atherosclerosis (ICD10-I70.0). Electronically Signed   By: Ulyses Jarred M.D.   On: 01/08/2019 05:26   Dg Chest Port 1 View  Result Date: 01/09/2019 CLINICAL DATA:  Respiratory failure EXAM: PORTABLE CHEST 1 VIEW COMPARISON:  January 08, 2019 FINDINGS: The patient has been extubated. There are stable postsurgical changes of the right lung field. The heart size is stable. Aortic calcifications are noted. There is no pneumothorax. There is mild vascular congestion without overt pulmonary edema. There is no acute osseous abnormality. IMPRESSION: Status post extubation, otherwise stable appearance of the chest. Electronically Signed   By: Constance Holster M.D.   On: 01/09/2019 05:41   Dg Chest Portable 1 View  Result Date: 01/08/2019 CLINICAL DATA:  Intubation EXAM: PORTABLE CHEST 1 VIEW COMPARISON:  12/17/2018 FINDINGS: The endotracheal tube terminates approximately 2.8 cm above the carina. The enteric tube extends below lead left hemidiaphragm. There is right lung volume loss consistent with the patient's history of a prior right upper lobe lobectomy. Dense consolidation is noted in the right perihilar region which is similar to prior studies. The heart size is stable. Aortic calcifications are again noted. There is no pneumothorax. No significant pleural effusion, however the left  costophrenic angle is not fully visualized. IMPRESSION: 1. Lines and tubes as above. 2. Stable appearance of the chest with right-sided volume loss and post radiation changes in the medial right upper lung zone. Electronically Signed   By: Constance Holster M.D.   On: 01/08/2019 03:45    Cardiac Studies   LHC 12/20/18:  Prox RCA lesion is 80% stenosed.  A drug-eluting stent was successfully placed using a STENT RESOLUTE ONYX 3.0X12.  Post intervention, there is a 0% residual stenosis.   1. Severe stenosis proximal RCA 2. Successful PTCA/DES x 1 proximal RCA  Continue ASA/Brilinta/statin and beta blocker. DAPT for one year.   Echo 12/12/18: IMPRESSIONS   1. Technically difficult study with limited views. Left ventricular ejection fraction, by visual estimation, is 35 to 40%. Hypokinesis of apex, anterior/septal/lateral apical walls, mid to basal inferolateral wall  2. Global right ventricle has normal systolic function.The right ventricular size is normal.  FINDINGS  Left Ventricle: Left ventricular ejection fraction, by visual estimation, is 35 to 40%. The left ventricle has moderately decreased function. There is mildly increased left ventricular hypertrophy. Concentric left ventricular hypertrophy. Spectral  Doppler shows Left ventricular diastolic Doppler parameters are consistent with impaired relaxation pattern of LV diastolic filling.   Patient Profile     63 y.o. female with CAD s/p recent STEMI (DES to [facility] followed by staged DES to RCA 12/2018), hypertension, COPD, ongoing tobacco abuse, lung cancer s/p R lung resection, colon cancer s/p colostomy, and chemo related neuropathy admitted respiratory arrest.  She called EMS with SOB and became unresponsive on the phone with  EMS.  Cardiology was consulted for syncope and elevated troponin.  Assessment & Plan    # Syncope: Occurred in the setting of respiratory arrest and hypotension (BP 59/43).    # CAD s/p recent  STEMI: # Hyperlipidemia:  # Demand ischemia: STEMI 12/2018.  At that time hs-troponin was >27K.  Now with elevation to 238.  She had CP prior to her syncopal episode.  Agree with Dr. Acie Fredrickson that hs-troponin should be much higher for stent thrombosis.  This is likely demand ischemia.  She does continue to have chest pain, though it seems very unlikely to be ischemic.  It certainly could be pain from her teeth radiating into her neck.  Her dentition is very poor and she notes that one of her teeth is cracked.  She has pain with eating.  It is also possible it could be related to GERD.  Agree with trying a GI cocktail.  No further ischemic work-up at this time.  Continue aspirin, ticagrelor, rosuvastatin. BP too low for beta blocker.  Echo pending.   # Chronic systolic and diastolic heart failure: BNP 915 on admission.  She received one dose of IV lasix this AM.  Volume status stable.  Will start lasix 40mg  po daily tomorrow.   # Ongoing tobacco abuse:  Cessation advised.   # Acute on chronic hypoxic respiratory failure: Steroids, nebs and antibiotics per primary team.  # Hypertension:  Home metoprolol and losartan on hold 2/2 hypotension.      For questions or updates, please contact [place] Please consult [email or web address] for contact info under        Signed, Skeet Latch, MD  01/09/2019, 9:46 AM

## 2019-01-09 NOTE — Consult Note (Addendum)
[place] Nurse ostomy consult note Consult requested for ostomy assistance.  Pt had colostomy surgery several years ago at [facility]  She states she is independent with pouch application and emptying prior to admission and uses a one piece pouch from [employer] with a clamp opening.  Informed patient we do not carry this product in the [facility] health formulary and will substitute a one piece [employer] pouch with velcro opening. Assisted patient with pouch application since she is weak and in bed while in ICU.   Stoma type/location: Stoma is red and viable, flush with skin level, 1 1/4 inches Peristomal assessment: intact skin surrounding Output: scant amt unformed brown stool in the pouch  Ostomy pouching: 1pc.  Education provided:  Pt denies need for further education or assistance.  Demonstrated how to open and close velcro to empty and patient was able to perform without assistance. Extra pouch left at the bedside for patient or bedside nurse use.  Enrolled patient in [place] program: No; Pt was on an ordering program prior to admission.   Please re-consult if further assistance is needed.  Thank-you,  Julien Girt MSN, [place], [place], [place], [place]

## 2019-01-09 NOTE — Progress Notes (Signed)
Patient arrived to unit from 62M, report received from [facility] bedside RN. Patient is alert and oriented upon arrival with no complaints of pain noted. Patient is on RA with O2 sats noted 94%. Patient ST 120's MD aware. Patient has friend present at bedside. Patient and friend advised to take home Listerine mouth rinse bottle, home medications and any other valuables. Bed is in lowest position with call light within reach. Will continue to monitor.

## 2019-01-09 NOTE — Progress Notes (Addendum)
PROGRESS NOTE    Chelsea Allison  QPR:[id number] DOB: 1955/10/04 DOA: 01/08/2019 PCP: Beckie Salts, MD      Brief Narrative:  Chelsea Allison is a 63 y.o. F with COPD not on home O2, CAD s/p recent STEMI and DES x2,: CA status post colostomy, NSCLC stage III, status post partial lung resection who presented with acute onset shortness of breath and chest discomfort the morning of admission.  EMS found the patient complaining of chest pain, dyspnea.  Given weakening respirations and decreased mentation tried to place an [place] airway in the field but were unsuccessful and she was bagged.    On arrival to the ER, patient had a pH 7.19, PCO2 62, and was intubated.  CTA was negative for PE, pneumonia, effusion.  She was admitted to the ICU started on Solu-Medrol, Zithromax, and diuretics.  Cardiology were consulted given her complaint to EMS of chest pain        Assessment & Plan:  Acute on chronic hypoxic and hypercarbic respiratory failure  COPD exacerbation The patient presents with new oxygen requirement, elevated CO2 and respiratory acidosis from COPD exacerbation.  She was extubated yesterday afternoon, relatively quickly, and has done well overnight. Still very wheezy.  ABG improved yesterday afternoon to 7.3, PCO2 46. -Continue Solu-Medrol, reduce to once daily -Continue scheduled and as needed bronchodilators -Continue Pulmicort -Continue azithromycin, transition to oral -Continue PPI   Chest and epigastric pain Troponin low and flat.she relates that it is different in character from her STEMI.  Cardiology do not believe, and I agree, that this is ischemic.  He feels that this may be gallstones.  She also relates that it is reflux-like in character.  Her LFTs are normal, I doubt it, but she is tender in the RUQ -Check upper quadrant ultrasound -Trial GI cocktail  ADDENDUM: GI cocktail helped a lot.  Reordered.   RUQ US showed lots of stones, maybe thickened wall, maybe  sonographic murphy's, but no perichole fluid.  I went back to talk to her and maybe she has had some post-prandial pain, although she is maximizing symptoms, and I think she is talking about some shooting pains, not something that sounds like biliary colic. -Trend WBC, LFTs, fever curve, post-prandial symptoms -If this is biliary colic, it will have to wait until she can safely stop Brilinta.   -If this declares itself as cholecystitis, she will need a perc drain   Acute on chronic systolic CHF Chest x-ray this morning still shows fluid overloaded, JVP looks elevated to me. EF is 40 to 45% on echo this morning, similar to after her stents during the last hospitalization.   -Continue diuretics -Continue potassium supplement -Strict ins and outs, daily BMP  Acute kidney injury Creatinine improved with diuresis yesterday.  Colon cancer Lung cancer Follows with Dr. Baird Cancer at [facility]  Coronary disease secondary prevention Elevated troponin -Continue aspirin and Brilinta -Continue statin  Hyperglycemia -Check hemoglobin A1c -Start low-dose Lantus -Start sliding scale corrections  Hypokalemia From diuresis -Replete K     MDM and disposition: The below labs and imaging reports were reviewed and summarized above.  Medication management as above.  Severe exacerbation of her chronic disease.  The patient was admitted with acute on chronic hypoxic and hypercarbic respiratory failure.  She has been extubated and his gases improving.  She is still quite wheezy, still on supplemental oxygen, and still experiencing chest pain.    We will need continued IV steroids, supplemental oxygen, frequent bronchodilators  and inpatient care.  We will continue working up her chest pain with right upper quadrant ultrasound.  If the upper quadrant ultrasound is normal, likely we will wean her off of oxygen in the next 2 to 3 days and discharged back home with home health or to a  SNF      DVT prophylaxis: Heparin Code Status: Full code Family Communication:     Consultants:   Cardiology  Critical care  Procedures:   11/1 CTA chest   11/2 echocardiogram  Antimicrobials:   Azithromycin 11/1>>   Subjective: She has chest pain, this extends from both shoulders all across her chest, and radiates up from her epigastrium.  She also has right upper quadrant pain.  She is very out of breath, wheezy.  She is very anxious.  No vomiting, confusion, fever, sputum production.  Objective: Vitals:   01/09/19 0700 01/09/19 0758 01/09/19 0900 01/09/19 1000  BP: 112/66  (!) 123/58 116/61  Pulse: 97  (!) 111 (!) 111  Resp: 20  (!) 27 (!) 22  Temp: 98.4 F (36.9 C) 97.6 F (36.4 C) 98.6 F (37 C) 98.8 F (37.1 C)  TempSrc:  Axillary    SpO2: 95%  95% 96%  Weight:      Height:        Intake/Output Summary (Last 24 hours) at 01/09/2019 1056 Last data filed at 01/09/2019 0800 Gross per 24 hour  Intake 1085.92 ml  Output 4760 ml  Net -3674.08 ml   Filed Weights   01/08/19 0340 01/09/19 0454  Weight: 84 kg 84.2 kg    Examination: General appearance:  adult female, alert and in moderate distress from pain, dyspnea.   HEENT: Anicteric, conjunctiva pink, lids and lashes normal. No nasal deformity, discharge, epistaxis.  Lips moist, dentures in place, oropharynx moist, no oral lesions, hearing normal.   Skin: Warm and dry.  No jaundice.  No suspicious rashes or lesions. Cardiac: Tachycardic, regular, nl S1-S2, no murmurs appreciated.  Capillary refill is brisk.  JVP elevated 4 cm above the clavicle.  No LE edema.  Radia  pulses 2+ and symmetric. Respiratory: Tachypneic, wheezy throughout, no rales, diminished air entry in posterior lung fields Abdomen: Abdomen soft.  Mild right upper quadrant TTP with guarding. No ascites, distension, hepatosplenomegaly.   MSK: No deformities or effusions. Neuro: Awake and alert.  EOMI, moves all extremities. Speech  fluent.   Fine tremor noted. Psych: Sensorium intact and responding to questions, attention normal. Affect anxious.  Judgment and insight appear normal.    Data Reviewed: I have personally reviewed following labs and imaging studies:  CBC: Recent Labs  Lab 01/08/19 0306 01/08/19 0439 01/08/19 0836 01/08/19 0837 01/09/19 0549  WBC 13.1*  --  7.6  --  8.7  NEUTROABS 5.3  --   --   --   --   HGB 14.2 12.9 12.8 12.9 12.8  HCT 47.4* 38.0 42.4 38.0 40.1  MCV 92.2  --  90.2  --  85.5  PLT 508*  --  396  --  300   Basic Metabolic Panel: Recent Labs  Lab 01/08/19 0306 01/08/19 0439 01/08/19 0836 01/08/19 0837 01/09/19 0549  NA 138 140  --  139 138  K 5.3* 4.3  --  4.4 3.3*  CL 105  --   --   --  102  CO2 18*  --   --   --  23  GLUCOSE 266*  --   --   --  172*  BUN 15  --   --   --  22  CREATININE 1.26*  --  0.97  --  0.96  CALCIUM 9.3  --   --   --  9.5  MG  --   --  2.2  --  2.0  PHOS  --   --  4.8*  --  3.4   GFR: Estimated Creatinine Clearance: 69.1 mL/min (by C-G formula based on SCr of 0.96 mg/dL). Liver Function Tests: Recent Labs  Lab 01/08/19 0306  AST 27  ALT 12  ALKPHOS 89  BILITOT 0.6  PROT 6.8  ALBUMIN 3.2*   No results for input(s): LIPASE, AMYLASE in the last 168 hours. No results for input(s): AMMONIA in the last 168 hours. Coagulation Profile: No results for input(s): INR, PROTIME in the last 168 hours. Cardiac Enzymes: No results for input(s): CKTOTAL, CKMB, CKMBINDEX, TROPONINI in the last 168 hours. BNP (last 3 results) No results for input(s): PROBNP in the last 8760 hours. HbA1C: No results for input(s): HGBA1C in the last 72 hours. CBG: Recent Labs  Lab 01/08/19 0343 01/08/19 1138 01/08/19 1740 01/09/19 1002  GLUCAP 193* 94 149* 237*   Lipid Profile: No results for input(s): CHOL, HDL, LDLCALC, TRIG, CHOLHDL, LDLDIRECT in the last 72 hours. Thyroid Function Tests: No results for input(s): TSH, T4TOTAL, FREET4, T3FREE, THYROIDAB  in the last 72 hours. Anemia Panel: No results for input(s): VITAMINB12, FOLATE, FERRITIN, TIBC, IRON, RETICCTPCT in the last 72 hours. Urine analysis: No results found for: COLORURINE, APPEARANCEUR, LABSPEC, PHURINE, GLUCOSEU, HGBUR, BILIRUBINUR, KETONESUR, PROTEINUR, UROBILINOGEN, NITRITE, LEUKOCYTESUR Sepsis Labs: @LABRCNTIP (procalcitonin:4,lacticacidven:4)  ) Recent Results (from the past 240 hour(s))  SARS Coronavirus 2 by RT PCR (hospital order, performed in [facility] hospital lab) Nasopharyngeal Nasopharyngeal Swab     Status: None   Collection Time: 01/08/19  3:06 AM   Specimen: Nasopharyngeal Swab  Result Value Ref Range Status   SARS Coronavirus 2 NEGATIVE NEGATIVE Final    Comment: (NOTE) If result is NEGATIVE SARS-CoV-2 target nucleic acids are NOT DETECTED. The SARS-CoV-2 RNA is generally detectable in upper and lower  respiratory specimens during the acute phase of infection. The lowest  concentration of SARS-CoV-2 viral copies this assay can detect is 250  copies / mL. A negative result does not preclude SARS-CoV-2 infection  and should not be used as the sole basis for treatment or other  patient management decisions.  A negative result may occur with  improper specimen collection / handling, submission of specimen other  than nasopharyngeal swab, presence of viral mutation(s) within the  areas targeted by this assay, and inadequate number of viral copies  (<250 copies / mL). A negative result must be combined with clinical  observations, patient history, and epidemiological information. If result is POSITIVE SARS-CoV-2 target nucleic acids are DETECTED. The SARS-CoV-2 RNA is generally detectable in upper and lower  respiratory specimens dur ing the acute phase of infection.  Positive  results are indicative of active infection with SARS-CoV-2.  Clinical  correlation with patient history and other diagnostic information is  necessary to determine patient  infection status.  Positive results do  not rule out bacterial infection or co-infection with other viruses. If result is PRESUMPTIVE POSTIVE SARS-CoV-2 nucleic acids MAY BE PRESENT.   A presumptive positive result was obtained on the submitted specimen  and confirmed on repeat testing.  While 2019 novel coronavirus  (SARS-CoV-2) nucleic acids may be present in the submitted sample  additional confirmatory testing may  be necessary for epidemiological  and / or clinical management purposes  to differentiate between  SARS-CoV-2 and other Sarbecovirus currently known to infect humans.  If clinically indicated additional testing with an alternate test  methodology [phone]) is advised. The SARS-CoV-2 RNA is generally  detectable in upper and lower respiratory sp ecimens during the acute  phase of infection. The expected result is Negative. Fact Sheet for Patients:  StrictlyIdeas.no Fact Sheet for Healthcare Providers: BankingDealers.co.za This test is not yet approved or cleared by the [place] FDA and has been authorized for detection and/or diagnosis of SARS-CoV-2 by FDA under an Emergency Use Authorization (EUA).  This EUA will remain in effect (meaning this test can be used) for the duration of the COVID-19 declaration under Section 564(b)(1) of the Act, 21 U.S.C. section 360bbb-3(b)(1), unless the authorization is terminated or revoked sooner. Performed at [facility], [place] [address], [place], [place] [address]   MRSA PCR Screening     Status: None   Collection Time: 01/08/19 11:28 AM   Specimen: Nasopharyngeal  Result Value Ref Range Status   MRSA by PCR NEGATIVE NEGATIVE Final    Comment:        The GeneXpert MRSA Assay (FDA approved for NASAL specimens only), is one component of a comprehensive MRSA colonization surveillance program. It is not intended to diagnose MRSA infection nor to guide or monitor treatment  for MRSA infections. Performed at [facility], [place] [address] [place], [place] [address]          Radiology Studies: Ct Angio Chest Pe W And/or Wo Contrast  Result Date: 01/08/2019 CLINICAL DATA:  Shortness of breath EXAM: CT ANGIOGRAPHY CHEST WITH CONTRAST TECHNIQUE: Multidetector CT imaging of the chest was performed using the standard protocol during bolus administration of intravenous contrast. Multiplanar CT image reconstructions and MIPs were obtained to evaluate the vascular anatomy. CONTRAST:  58mL OMNIPAQUE IOHEXOL 350 MG/ML SOLN COMPARISON:  None. FINDINGS: Cardiovascular: --Pulmonary arteries: Contrast injection is sufficient to demonstrate satisfactory opacification of the pulmonary arteries to the segmental level, with attenuation of at least 200 HU at the main pulmonary artery. There is no pulmonary embolus. The main pulmonary artery is within normal limits for size. --Aorta: Limited opacification of the aorta due to bolus timing optimization for the pulmonary arteries. The course and caliber of the aorta are normal. Conventional 3 vessel aortic branching pattern. There is moderate aortic atherosclerosis. --Heart: Normal size. No pericardial effusion. There are coronary artery calcifications. Mediastinum/Nodes: No mediastinal, hilar or axillary lymphadenopathy. The visualized thyroid and thoracic esophageal course are unremarkable. Lungs/Pleura: There is moderate pulmonary edema with bilateral dependent atelectasis. There is scarring along the medial right hemithorax that has the appearance of radiation fibrosis. There is an 8 mm right middle lobe nodule and an 11 mm left lower lobe nodule. Both of these may be inflammatory. Upper Abdomen: Contrast bolus timing is not optimized for evaluation of the abdominal organs. The visualized portions of the organs of the upper abdomen are normal. Musculoskeletal: No chest wall abnormality. No bony spinal canal stenosis. Review of the MIP  images confirms the above findings. IMPRESSION: 1. No pulmonary embolus. 2. Moderate pulmonary edema and bilateral dependent atelectasis. 3. Postradiation fibrosis of the medial right hemithorax. 4. Coronary artery and aortic Atherosclerosis (ICD10-I70.0). Electronically Signed   By: Ulyses Jarred M.D.   On: 01/08/2019 05:26   Dg Chest Port 1 View  Result Date: 01/09/2019 CLINICAL DATA:  Respiratory failure EXAM: PORTABLE CHEST  1 VIEW COMPARISON:  January 08, 2019 FINDINGS: The patient has been extubated. There are stable postsurgical changes of the right lung field. The heart size is stable. Aortic calcifications are noted. There is no pneumothorax. There is mild vascular congestion without overt pulmonary edema. There is no acute osseous abnormality. IMPRESSION: Status post extubation, otherwise stable appearance of the chest. Electronically Signed   By: Constance Holster M.D.   On: 01/09/2019 05:41   Dg Chest Portable 1 View  Result Date: 01/08/2019 CLINICAL DATA:  Intubation EXAM: PORTABLE CHEST 1 VIEW COMPARISON:  12/17/2018 FINDINGS: The endotracheal tube terminates approximately 2.8 cm above the carina. The enteric tube extends below lead left hemidiaphragm. There is right lung volume loss consistent with the patient's history of a prior right upper lobe lobectomy. Dense consolidation is noted in the right perihilar region which is similar to prior studies. The heart size is stable. Aortic calcifications are again noted. There is no pneumothorax. No significant pleural effusion, however the left costophrenic angle is not fully visualized. IMPRESSION: 1. Lines and tubes as above. 2. Stable appearance of the chest with right-sided volume loss and post radiation changes in the medial right upper lung zone. Electronically Signed   By: Constance Holster M.D.   On: 01/08/2019 03:45        Scheduled Meds:  alum & mag hydroxide-simeth  30 mL Oral Once   aspirin  81 mg Oral Daily   azithromycin   500 mg Oral Daily   Followed by   Derrill Memo ON 01/10/2019] azithromycin  250 mg Oral Daily   budesonide (PULMICORT) nebulizer solution  0.25 mg Nebulization BID   Chlorhexidine Gluconate Cloth  6 each Topical Daily   furosemide  40 mg Intravenous Once   heparin  5,000 Units Subcutaneous Q8H   insulin aspart  0-15 Units Subcutaneous TID WC   insulin aspart  0-5 Units Subcutaneous QHS   insulin glargine  7 Units Subcutaneous Daily   ipratropium-albuterol  3 mL Nebulization QID   [START ON 01/10/2019] methylPREDNISolone (SOLU-MEDROL) injection  60 mg Intravenous Daily   pantoprazole  40 mg Oral Daily   potassium chloride  40 mEq Oral BID   Followed by   Derrill Memo ON 01/10/2019] potassium chloride  20 mEq Oral Daily   rosuvastatin  10 mg Oral q1800   ticagrelor  90 mg Oral BID   Continuous Infusions:  sodium chloride 10 mL/hr at 01/09/19 0500   sodium chloride       LOS: 1 day    Time spent: 35 minutes    Edwin Dada, MD [facility] 01/09/2019, 10:56 AM     Please page through [place]:  [email or web address] If 7PM-7AM, please contact night-coverage

## 2019-01-09 NOTE — Progress Notes (Signed)
  Echocardiogram 2D Echocardiogram has been performed.  Jennessa Trigo A Gunnard Dorrance 01/09/2019, 10:25 AM

## 2019-01-10 ENCOUNTER — Inpatient Hospital Stay (HOSPITAL_COMMUNITY): Payer: Medicare HMO

## 2019-01-10 DIAGNOSIS — I5042 Chronic combined systolic (congestive) and diastolic (congestive) heart failure: Secondary | ICD-10-CM

## 2019-01-10 DIAGNOSIS — J9621 Acute and chronic respiratory failure with hypoxia: Secondary | ICD-10-CM

## 2019-01-10 DIAGNOSIS — J9622 Acute and chronic respiratory failure with hypercapnia: Principal | ICD-10-CM

## 2019-01-10 DIAGNOSIS — N289 Disorder of kidney and ureter, unspecified: Secondary | ICD-10-CM

## 2019-01-10 LAB — HEMOGLOBIN A1C
Hgb A1c MFr Bld: 5.7 % — ABNORMAL HIGH (ref 4.8–5.6)
Mean Plasma Glucose: 116.89 mg/dL

## 2019-01-10 LAB — GLUCOSE, CAPILLARY
Glucose-Capillary: 103 mg/dL — ABNORMAL HIGH (ref 70–99)
Glucose-Capillary: 137 mg/dL — ABNORMAL HIGH (ref 70–99)
Glucose-Capillary: 221 mg/dL — ABNORMAL HIGH (ref 70–99)
Glucose-Capillary: 144 mg/dL — ABNORMAL HIGH (ref 70–99)

## 2019-01-10 LAB — COMPREHENSIVE METABOLIC PANEL
ALT: 12 U/L (ref 0–44)
AST: 19 U/L (ref 15–41)
Albumin: 3.4 g/dL — ABNORMAL LOW (ref 3.5–5.0)
Alkaline Phosphatase: 65 U/L (ref 38–126)
Anion gap: 13 (ref 5–15)
BUN: 25 mg/dL — ABNORMAL HIGH (ref 8–23)
CO2: 24 mmol/L (ref 22–32)
Calcium: 9.9 mg/dL (ref 8.9–10.3)
Chloride: 105 mmol/L (ref 98–111)
Creatinine, Ser: 0.8 mg/dL (ref 0.44–1.00)
GFR calc Af Amer: >60 mL/min (ref >60)
GFR calc non Af Amer: >60 mL/min (ref >60)
Glucose, Bld: 129 mg/dL — ABNORMAL HIGH (ref 70–99)
Potassium: 4.1 mmol/L (ref 3.5–5.1)
Sodium: 142 mmol/L (ref 135–145)
Total Bilirubin: 0.7 mg/dL (ref 0.3–1.2)
Total Protein: 6.6 g/dL (ref 6.5–8.1)

## 2019-01-10 LAB — CBC
HCT: 41.8 % (ref 36.0–46.0)
Hemoglobin: 12.9 g/dL (ref 12.0–15.0)
MCH: 26.8 pg (ref 26.0–34.0)
MCHC: 30.9 g/dL (ref 30.0–36.0)
MCV: 86.9 fL (ref 80.0–100.0)
Platelets: 443 10*3/uL — ABNORMAL HIGH (ref 150–400)
RBC: 4.81 MIL/uL (ref 3.87–5.11)
RDW: 15.3 % (ref 11.5–15.5)
WBC: 15.4 10*3/uL — ABNORMAL HIGH (ref 4.0–10.5)
nRBC: 0 % (ref 0.0–0.2)

## 2019-01-10 MED ORDER — PREDNISONE 20 MG PO TABS
40.0000 mg | ORAL_TABLET | Freq: Every day | ORAL | Status: DC
  Administered 2019-01-11: 40 mg via ORAL
  Filled 2019-01-10: qty 2

## 2019-01-10 MED ORDER — METOPROLOL SUCCINATE ER 25 MG PO TB24
25.0000 mg | ORAL_TABLET | Freq: Every day | ORAL | Status: DC
  Administered 2019-01-10 – 2019-01-11 (×2): 25 mg via ORAL
  Filled 2019-01-10 (×2): qty 1

## 2019-01-10 MED ORDER — TECHNETIUM TC 99M MEBROFENIN IV KIT
5.0000 | PACK | Freq: Once | INTRAVENOUS | Status: AC | PRN
  Administered 2019-01-10: 5 via INTRAVENOUS

## 2019-01-10 MED ORDER — LOSARTAN POTASSIUM 25 MG PO TABS
25.0000 mg | ORAL_TABLET | Freq: Every day | ORAL | Status: DC
  Administered 2019-01-11: 25 mg via ORAL
  Filled 2019-01-10: qty 1

## 2019-01-10 NOTE — Progress Notes (Signed)
Progress Note  Patient Name: Kem Parcher Date of Encounter: 01/10/2019  Primary Cardiologist: Sinclair Grooms, MD - Follows up at [facility].  Currently no chest pain.  Breathing is stable.   Inpatient Medications    Scheduled Meds: . aspirin  81 mg Oral Daily  . azithromycin  250 mg Oral Daily  . budesonide (PULMICORT) nebulizer solution  0.25 mg Nebulization BID  . Chlorhexidine Gluconate Cloth  6 each Topical Daily  . furosemide  40 mg Oral Daily  . heparin  5,000 Units Subcutaneous Q8H  . insulin aspart  0-15 Units Subcutaneous TID WC  . insulin aspart  0-5 Units Subcutaneous QHS  . insulin glargine  7 Units Subcutaneous Daily  . ipratropium  0.5 mg Nebulization TID  . levalbuterol  0.63 mg Nebulization TID  . methylPREDNISolone (SOLU-MEDROL) injection  60 mg Intravenous Daily  . pantoprazole  40 mg Oral Daily  . potassium chloride  20 mEq Oral Daily  . rosuvastatin  10 mg Oral q1800  . ticagrelor  90 mg Oral BID   Continuous Infusions: . sodium chloride 10 mL/hr at 01/09/19 1500  . sodium chloride     PRN Meds: acetaminophen, albuterol, ALPRAZolam, alum & mag hydroxide-simeth, benzocaine, bisacodyl, docusate sodium, fentaNYL (SUBLIMAZE) injection, ondansetron (ZOFRAN) IV, phenol, traMADol   Vital Signs    Vitals:   01/10/19 0426 01/10/19 0500 01/10/19 0603 01/10/19 0723  BP:  (!) 145/65  111/65  Pulse: 98 (!) 102 96 100  Resp: (!) 24 (!) 28 (!) 22 20  Temp:    98.3 F (36.8 C)  TempSrc:    Oral  SpO2: 92% 92% 96% 94%  Weight:      Height:        Intake/Output Summary (Last 24 hours) at 01/10/2019 1021 Last data filed at 01/10/2019 0500 Gross per 24 hour  Intake 299.97 ml  Output 995 ml  Net -695.03 ml   Last 3 Weights 01/09/2019 01/08/2019 12/20/2018  Weight (lbs) 185 lb 10 oz 185 lb 3 oz 186 lb  Weight (kg) 84.2 kg 84 kg 84.369 kg      Telemetry    Sinus rhythm.  PVCs. - Personally Reviewed  ECG    Sinus  tachycardia.  Rate 101 bpm.  Low voltage precordial leads.  Prior inferior infarct.  Nonspecific ST changes.- Personally Reviewed  Physical Exam   VS:  BP 111/65 (BP Location: Left Arm)   Pulse 100   Temp 98.3 F (36.8 C) (Oral)   Resp 20   Ht 5\' 8"  (1.727 m)   Wt 84.2 kg   SpO2 94%   BMI 28.22 kg/m  , BMI Body mass index is 28.22 kg/m. GENERAL:  Well appearing.  Mild distress. HEENT: Pupils equal round and reactive, fundi not visualized, oral mucosa unremarkable NECK:  No jugular venous distention, waveform within normal limits, carotid upstroke brisk and symmetric, no bruits LUNGS:  Clear to auscultation bilaterally HEART:  RRR.  PMI not displaced or sustained,S1 and S2 within normal limits, no S3, no S4, no clicks, no rubs, no murmurs ABD:  Flat, positive bowel sounds normal in frequency in pitch, no bruits, no rebound, no guarding, no midline pulsatile mass, no hepatomegaly, no splenomegaly EXT:  2 plus pulses throughout, no edema, no cyanosis no clubbing SKIN:  No rashes no nodules NEURO:  Cranial nerves II through XII grossly intact, motor grossly intact throughout PSYCH:  Cognitively intact, oriented to person place and  time   Labs    High Sensitivity Troponin:   Recent Labs  Lab 12/12/18 0657 12/12/18 1132 01/08/19 0306 01/08/19 0615 01/09/19 0549  TROPONINIHS 65* >27,000* 23* 238* 154*      Chemistry Recent Labs  Lab 01/08/19 0306  01/08/19 0836 01/08/19 0837 01/09/19 0549 01/10/19 0412  NA 138   < >  --  139 138 142  K 5.3*   < >  --  4.4 3.3* 4.1  CL 105  --   --   --  102 105  CO2 18*  --   --   --  23 24  GLUCOSE 266*  --   --   --  172* 129*  BUN 15  --   --   --  22 25*  CREATININE 1.26*  --  0.97  --  0.96 0.80  CALCIUM 9.3  --   --   --  9.5 9.9  PROT 6.8  --   --   --   --  6.6  ALBUMIN 3.2*  --   --   --   --  3.4*  AST 27  --   --   --   --  19  ALT 12  --   --   --   --  12  ALKPHOS 89  --   --   --   --  65  BILITOT 0.6  --   --   --    --  0.7  GFRNONAA 46*  --  >60  --  >60 >60  GFRAA 53*  --  >60  --  >60 >60  ANIONGAP 15  --   --   --  13 13   < > = values in this interval not displayed.     Hematology Recent Labs  Lab 01/08/19 0836 01/08/19 0837 01/09/19 0549 01/10/19 0412  WBC 7.6  --  8.7 15.4*  RBC 4.70  --  4.69 4.81  HGB 12.8 12.9 12.8 12.9  HCT 42.4 38.0 40.1 41.8  MCV 90.2  --  85.5 86.9  MCH 27.2  --  27.3 26.8  MCHC 30.2  --  31.9 30.9  RDW 15.3  --  15.2 15.3  PLT 396  --  396 443*    BNP Recent Labs  Lab 01/08/19 0306 01/09/19 0549  BNP 915.6* 818.4*     DDimer  Recent Labs  Lab 01/08/19 0306  DDIMER 2.87*     Radiology    US Abdomen Complete  Result Date: 01/09/2019 CLINICAL DATA:  Several days of right upper quadrant pain EXAM: ABDOMEN ULTRASOUND COMPLETE COMPARISON:  CTA chest 01/08/2019 abdominal radiograph 12/15/2018 FINDINGS: Gallbladder: Multiple echogenic, posteriorly shadowing gallstones appear to layer dependently within the gallbladder. There is borderline gallbladder wall thickening. No pericholecystic fluid is seen. Sonographic Percell Miller sign is reportedly positive however. Common bile duct: Diameter: 5 mm Liver: Diffusely increased hepatic echogenicity with loss of definition of the portal triads and diminished posterior through transmission compatible with hepatic steatosis. No focal lesion identified. Portal vein is patent on color Doppler imaging with normal direction of blood flow towards the liver. IVC: No abnormality visualized. Pancreas: Visualized portion unremarkable. Spleen: Size and appearance within normal limits. Right Kidney: Length: 11.1 cm. Echogenicity within normal limits. No mass or hydronephrosis visualized. Left Kidney: Length: 10.2 cm. Echogenicity within normal limits. 1.4 x 0.9 x 1.0 cm anechoic cyst seen in the upper pole left kidney. No concerning renal mass or  hydronephrosis. Abdominal aorta: No aneurysm visualized. Other findings: None. IMPRESSION:  Cholelithiasis and borderline gallbladder wall thickening with a positive sonographic Murphy sign. While there is absence of pericholecystic fluid, such findings could reflect an early or developing cholecystitis in the appropriate clinical context. Simple appearing left renal cyst. Hepatic steatosis. Electronically Signed   By: Lovena Le M.D.   On: 01/09/2019 14:44   Dg Chest Port 1 View  Result Date: 01/09/2019 CLINICAL DATA:  Respiratory failure EXAM: PORTABLE CHEST 1 VIEW COMPARISON:  January 08, 2019 FINDINGS: The patient has been extubated. There are stable postsurgical changes of the right lung field. The heart size is stable. Aortic calcifications are noted. There is no pneumothorax. There is mild vascular congestion without overt pulmonary edema. There is no acute osseous abnormality. IMPRESSION: Status post extubation, otherwise stable appearance of the chest. Electronically Signed   By: Constance Holster M.D.   On: 01/09/2019 05:41    Cardiac Studies   LHC 12/20/18:  Prox RCA lesion is 80% stenosed.  A drug-eluting stent was successfully placed using a STENT RESOLUTE ONYX 3.0X12.  Post intervention, there is a 0% residual stenosis.   1. Severe stenosis proximal RCA 2. Successful PTCA/DES x 1 proximal RCA  Continue ASA/Brilinta/statin and beta blocker. DAPT for one year.   Echo 12/12/18: IMPRESSIONS   1. Technically difficult study with limited views. Left ventricular ejection fraction, by visual estimation, is 35 to 40%. Hypokinesis of apex, anterior/septal/lateral apical walls, mid to basal inferolateral wall  2. Global right ventricle has normal systolic function.The right ventricular size is normal.  FINDINGS  Left Ventricle: Left ventricular ejection fraction, by visual estimation, is 35 to 40%. The left ventricle has moderately decreased function. There is mildly increased left ventricular hypertrophy. Concentric left ventricular hypertrophy. Spectral  Doppler  shows Left ventricular diastolic Doppler parameters are consistent with impaired relaxation pattern of LV diastolic filling.   Patient Profile     63 y.o. female with CAD s/p recent STEMI (DES to [facility] followed by staged DES to RCA 12/2018), hypertension, COPD, ongoing tobacco abuse, lung cancer s/p R lung resection, colon cancer s/p colostomy, and chemo related neuropathy admitted respiratory arrest.  She called EMS with SOB and became unresponsive on the phone with EMS.  Cardiology was consulted for syncope and elevated troponin.  Assessment & Plan    # Syncope: Occurred in the setting of respiratory arrest and hypotension (BP 59/43).    # CAD s/p recent STEMI: # Hyperlipidemia:  # Demand ischemia: STEMI 12/2018.  She underwent staged PCI of the LCX and RCA.  There is also chronic occlusion of the LAD with collaterals from L -->L.   At that time hs-troponin was >27K.  Now with elevation to 238.  She had CP prior to her syncopal episode.  Agree with Dr. Acie Fredrickson that hs-troponin should be much higher for stent thrombosis.  Echo this admission showed improvement in LVEF to 45-50%.  This is likely demand ischemia.  No further ischemic work-up at this time.  Continue aspirin, ticagrelor, rosuvastatin. BP is better.  Resume metoprolol.    # Chronic systolic and diastolic heart failure: BNP 915 on admission.  She received one dose of IV lasix this AM.  Volume status stable. Lasix 40mg  daily.  # Ongoing tobacco abuse:  Cessation advised.   # Acute on chronic hypoxic respiratory failure: Steroids, nebs and antibiotics per primary team.  # Hypertension:  Home metoprolol and losartan initially held.  Resume metoprolol today and losartan tomorrow.  Patient is OK for discharge from a cardiology standpoint.  She already has follow up scheduled for follow up.     For questions or updates, please contact [place] Please consult [email or web address] for contact info under        Signed, Skeet Latch, MD  01/10/2019, 10:21 AM

## 2019-01-10 NOTE — Progress Notes (Signed)
ICU Transitions of Care Pharmacist Intervention    Chelsea Allison is a 63 y.o. female admitted on 01/08/2019  from home, and has been in the intensive care unit for 20h.  Medical History: Past Medical History:  Diagnosis Date  . CAD (coronary artery disease) 10/20 DES to pRCA, DES mCx   . Cancer of lung ([place])    Twice  . Colon cancer ([place])   . COPD (chronic obstructive pulmonary disease) ([place])   . Hyperlipidemia   . Hypertension   . Ischemic cardiomyopathy   . Neuropathy    Secondary to chemo  . STEMI (ST elevation myocardial infarction) ([place]) 12/12/2018  . Tobacco abuse      Home Medication List:  Medications Prior to Admission  Medication Sig Dispense Refill Last Dose  . albuterol (VENTOLIN HFA) 108 (90 Base) MCG/ACT inhaler Inhale 2 puffs into the lungs every 4 (four) hours as needed for shortness of breath or wheezing.   01/07/2019 at Unknown time  . ALPRAZolam (XANAX) 0.5 MG tablet Take 0.5 mg by mouth 2 (two) times daily as needed for anxiety.   01/08/2019 at Unknown time  . aspirin 81 MG chewable tablet Chew 1 tablet (81 mg total) by mouth daily. 30 tablet 6 01/07/2019 at Unknown time  . fluticasone furoate-vilanterol (BREO ELLIPTA) 200-25 MCG/INH AEPB Inhale 1 puff into the lungs daily.   01/07/2019 at Unknown time  . gabapentin (NEURONTIN) 300 MG capsule Take 300 mg by mouth 3 (three) times daily.   Past Week at Unknown time  . ipratropium-albuterol (DUONEB) 0.5-2.5 (3) MG/3ML SOLN Inhale 3 mLs into the lungs every 4 (four) hours as needed for shortness of breath or wheezing.   01/07/2019 at Unknown time  . losartan (COZAAR) 25 MG tablet Take 0.5 tablets (12.5 mg total) by mouth daily. 30 tablet 6 01/07/2019 at Unknown time  . metoprolol succinate (TOPROL-XL) 25 MG 24 hr tablet Take 1 tablet (25 mg total) by mouth daily. 30 tablet 6 01/07/2019 at 0900  . nitroGLYCERIN (NITROSTAT) 0.4 MG SL tablet Place 1 tablet (0.4 mg total) under the tongue every 5 (five) minutes x 3 doses as  needed for chest pain. 25 tablet 0 unknown  . rosuvastatin (CRESTOR) 10 MG tablet Take 1 tablet (10 mg total) by mouth daily at 6 PM. 30 tablet 6 01/07/2019 at Unknown time  . ticagrelor (BRILINTA) 90 MG TABS tablet Take 1 tablet (90 mg total) by mouth 2 (two) times daily. 180 tablet 3 01/07/2019 at Unknown time  . traMADol (ULTRAM) 50 MG tablet Take 100 mg by mouth every 6 (six) hours as needed for moderate pain.   01/07/2019 at Unknown time  . Vitamin D, Ergocalciferol, (DRISDOL) 1.25 MG (50000 UT) CAPS capsule Take 50,000 Units by mouth every 7 (seven) days.   Past Month at Unknown time     Has a pharmacist made an intervention on this patient's medication list?  no, because Interventions offered were not accepted.   Transitions of care were discussed with Dr. Loleta Books and the following interventions were made:  Stress Ulcer Prophylaxis:  A medication for stress ulcer prophylaxis was started during ICU admission and not intended to be continued at discharge. No intervention was made for a stress ulcer prophylaxis medication because upon discussion with the physician the patient was having pleuritic chest pain that was relieved with a GI cocktail so the MD wanted to continue for that indication.   January 10, 2019  Alycia Rossetti, PharmD, BCPS 12:22 PM

## 2019-01-10 NOTE — Evaluation (Signed)
Physical Therapy Evaluation Patient Details Name: Chelsea Allison MRN: [id number] DOB: 1955/04/12 Today's Date: 01/10/2019   History of Present Illness  Pt is a 63 yo female who went unresponsive while on phone with EMS with report of chest pain and SOB. PMH: anxiety, CAD, lunc ca, COPD, HTN, STEMI, tobacco abuse, neuropathy. PSH: colosoty, L heart cath and stent intervension 12/12/18. Pt was intubated and extubated on 11/1.   Clinical Impression  Pt admitted with above. Pt was living alone recently and undergoing HHPT. Pt now functioning a minA level however suspect will progress quickly. Pt tolerated 120' of amb with RW with min guard assist. Pt c/o L LE pain. Pt reports friend can stay with her 24/7 for a few days if needed. Acute PT to cont to follow to progress indep.    Follow Up Recommendations Home health PT;Supervision/Assistance - 24 hour(24/7 initially)    Equipment Recommendations  None recommended by PT    Recommendations for Other Services       Precautions / Restrictions Precautions Precautions: Fall Precaution Comments: very anxious Restrictions Weight Bearing Restrictions: No      Mobility  Bed Mobility Overal bed mobility: Modified Independent Bed Mobility: Supine to Sit     Supine to sit: Modified independent (Device/Increase time);HOB elevated     General bed mobility comments: HOB slightly elevated, used bed rail, no physical assist  Transfers Overall transfer level: Needs assistance Equipment used: None Transfers: Sit to/from Stand Sit to Stand: Min guard         General transfer comment: increased time, min guard for safety due to first time up in a few days  Ambulation/Gait Ambulation/Gait assistance: Min guard Gait Distance (Feet): 120 Feet Assistive device: Rolling walker (2 wheeled) Gait Pattern/deviations: Step-through pattern;Decreased stride length;Trunk flexed Gait velocity: decreased Gait velocity interpretation: 1.31 - 2.62 ft/sec,  indicative of limited community ambulator General Gait Details: +SOB, pt SpO2 at 93% on 2LO2 via [address], 1 standing rest break. pt c/o L LE leg pain, mild antalgia  Stairs            Wheelchair Mobility    Modified Rankin (Stroke Patients Only)       Balance Overall balance assessment: Needs assistance Sitting-balance support: Feet supported;No upper extremity supported Sitting balance-Leahy Scale: Good Sitting balance - Comments: pt able to don socks without assist in bed   Standing balance support: No upper extremity supported Standing balance-Leahy Scale: Fair Standing balance comment: able to stand statically however requires RW for safe ambulation                             Pertinent Vitals/Pain Pain Assessment: Faces Faces Pain Scale: Hurts even more Pain Location: pain at L leg at "heparin site" Pain Descriptors / Indicators: Sore Pain Intervention(s): Monitored during session    Home Living Family/patient expects to be discharged to:: Private residence Living Arrangements: Alone Available Help at Discharge: Family;Available PRN/intermittently Type of Home: Apartment Home Access: Level entry     Home Layout: One level Home Equipment: Walker - 2 wheels;Cane - single point;Bedside commode      Prior Function Level of Independence: Needs assistance   Gait / Transfers Assistance Needed: has been using cane or walker, undergoing HHPT due to recent hospital stay  ADL's / Homemaking Assistance Needed: reports friend assisting her with a bird bath  Comments: family or friend does grocery shopping     Hand Dominance   Dominant Hand:  Right    Extremity/Trunk Assessment   Upper Extremity Assessment Upper Extremity Assessment: Generalized weakness    Lower Extremity Assessment Lower Extremity Assessment: Generalized weakness    Cervical / Trunk Assessment Cervical / Trunk Assessment: Other exceptions Cervical / Trunk Exceptions: colostomy   Communication   Communication: No difficulties  Cognition Arousal/Alertness: Awake/alert Behavior During Therapy: Anxious Overall Cognitive Status: Within Functional Limits for tasks assessed                                 General Comments: pt anxious at baseline      General Comments General comments (skin integrity, edema, etc.): VSS, Pt SPO2 91-94% on RA in sitting    Exercises     Assessment/Plan    PT Assessment Patient needs continued PT services  PT Problem List Decreased strength;Decreased activity tolerance;Decreased balance;Decreased mobility;Decreased knowledge of use of DME;Cardiopulmonary status limiting activity       PT Treatment Interventions DME instruction;Gait training;Functional mobility training;Therapeutic activities;Therapeutic exercise;Balance training;Patient/family education    PT Goals (Current goals can be found in the Care Plan section)  Acute Rehab PT Goals PT Goal Formulation: With patient Time For Goal Achievement: 01/24/19 Potential to Achieve Goals: Good    Frequency Min 3X/week   Barriers to discharge Decreased caregiver support reports friend can stay with her a few days    Co-evaluation               AM-PAC PT "6 Clicks" Mobility  Outcome Measure Help needed turning from your back to your side while in a flat bed without using bedrails?: None Help needed moving from lying on your back to sitting on the side of a flat bed without using bedrails?: None Help needed moving to and from a bed to a chair (including a wheelchair)?: A Little Help needed standing up from a chair using your arms (e.g., wheelchair or bedside chair)?: A Little Help needed to walk in hospital room?: A Little Help needed climbing 3-5 steps with a railing? : A Little 6 Click Score: 20    End of Session Equipment Utilized During Treatment: Gait belt Activity Tolerance: Patient tolerated treatment well Patient left: in chair;with call  bell/phone within reach;with chair alarm set Nurse Communication: Patient requests pain meds PT Visit Diagnosis: Other abnormalities of gait and mobility (R26.89);Muscle weakness (generalized) (M62.81)    Time: 5643-3295 PT Time Calculation (min) (ACUTE ONLY): 32 min   Charges:   PT Evaluation $PT Eval Moderate Complexity: 1 Mod PT Treatments $Gait Training: 8-22 mins        Kittie Plater, PT, DPT Acute Rehabilitation Services Pager #: [phone] Office #: [phone]   Berline Lopes 01/10/2019, 9:14 AM

## 2019-01-10 NOTE — Progress Notes (Signed)
Occupational Therapy Evaluation Patient Details Name: Chelsea Allison MRN: [id number] DOB: 07-20-1955 Today's Date: 01/10/2019    History of Present Illness Pt is a 63 yo female who went unresponsive while on phone with EMS with report of chest pain and SOB. PMH: anxiety, CAD, lunc ca, COPD, HTN, STEMI, tobacco abuse, neuropathy. PSH: colosoty, L heart cath and stent intervension 12/12/18. Pt was intubated and extubated on 11/1.    Clinical Impression   PTA, pt was living at home alone, and reports she was independent with ADL/IADL and functional mobility. Pt reports having increased difficulty with making her bed and cleaning the house due to decreased activity tolerance. Pt currently requires minguard-minA for grooming while standing at sink level and minA for functional mobility at RW level. Pt on RA SpO2 94% with talking dropped to 90% with standing and pt reports increased SOB, reapplied supplemental O2. Pt maintained 2lnc throughout rest of session, maintained O2 >97% while standing to groom at sink level. Due to decline in current level of function, pt would benefit from acute OT to address established goals to facilitate safe D/C to venue listed below. At this time, recommend HHOT follow-up. Will continue to follow acutely.     Follow Up Recommendations  Supervision/Assistance - 24 hour;Home health OT    Equipment Recommendations  Tub/shower bench    Recommendations for Other Services       Precautions / Restrictions Precautions Precautions: Fall Precaution Comments: very anxious Restrictions Weight Bearing Restrictions: No      Mobility Bed Mobility               General bed mobility comments: sitting in recliner upon arrival  Transfers Overall transfer level: Needs assistance Equipment used: None Transfers: Sit to/from Stand Sit to Stand: Min assist         General transfer comment: minA for safety, pt with minor instability in standing    Balance Overall  balance assessment: Needs assistance Sitting-balance support: Feet supported;No upper extremity supported Sitting balance-Leahy Scale: Good     Standing balance support: No upper extremity supported Standing balance-Leahy Scale: Fair Standing balance comment: able to stand statically however requires RW for safe ambulation                           ADL either performed or assessed with clinical judgement   ADL Overall ADL's : Needs assistance/impaired Eating/Feeding: Modified independent;Sitting Eating/Feeding Details (indicate cue type and reason): eating lunch upon arrival Grooming: Wash/dry face;Oral care;Min guard;Minimal assistance;Standing Grooming Details (indicate cue type and reason): standing at sink level Upper Body Bathing: Set up;Sitting   Lower Body Bathing: Minimal assistance;Sit to/from stand   Upper Body Dressing : Set up;Sitting   Lower Body Dressing: Minimal assistance;Sit to/from stand Lower Body Dressing Details (indicate cue type and reason): pt able to figure 4 to don/doff socks Toilet Transfer: Minimal assistance;Stand-pivot;Ambulation Toilet Transfer Details (indicate cue type and reason): simulated in room ambulated with RW Toileting- Clothing Manipulation and Hygiene: Minimal assistance;Sit to/from stand       Functional mobility during ADLs: Minimal assistance;Rolling walker General ADL Comments: deconditioned pt on 2lnc throughout grooming at sink level SpO2 >97%:pt stood on RA O2 dropped from 95% to 90%, provided supplemental O2     Vision         Perception     Praxis      Pertinent Vitals/Pain Pain Assessment: No/denies pain Pain Intervention(s): Monitored during session  Hand Dominance Right   Extremity/Trunk Assessment Upper Extremity Assessment Upper Extremity Assessment: Generalized weakness   Lower Extremity Assessment Lower Extremity Assessment: Generalized weakness   Cervical / Trunk Assessment Cervical /  Trunk Assessment: Other exceptions Cervical / Trunk Exceptions: colostomy   Communication Communication Communication: No difficulties   Cognition Arousal/Alertness: Awake/alert Behavior During Therapy: Anxious Overall Cognitive Status: Within Functional Limits for tasks assessed                                 General Comments: pt anxious at baseline reports increased anxiety with events prior to admission   General Comments  HR up to 120 with grooming while standing at sink level;SpO2 90-94% on RA, >97% on 2lnc    Exercises     Shoulder Instructions      Home Living Family/patient expects to be discharged to:: Private residence Living Arrangements: Alone Available Help at Discharge: Family;Available PRN/intermittently Type of Home: Apartment Home Access: Level entry     Home Layout: One level     Bathroom Shower/Tub: Teacher, early years/pre: Standard     Home Equipment: Environmental consultant - 2 wheels;Cane - single point;Bedside commode          Prior Functioning/Environment Level of Independence: Needs assistance  Gait / Transfers Assistance Needed: has been using cane or walker, undergoing HHPT due to recent hospital stay ADL's / Homemaking Assistance Needed: reports friend assisting her with a bird bath   Comments: family or friend does grocery shopping        OT Problem List: Decreased activity tolerance;Decreased strength;Impaired balance (sitting and/or standing);Decreased safety awareness;Decreased knowledge of use of DME or AE;Decreased knowledge of precautions;Cardiopulmonary status limiting activity;Obesity      OT Treatment/Interventions: Self-care/ADL training;Therapeutic exercise;DME and/or AE instruction;Therapeutic activities;Patient/family education;Balance training    OT Goals(Current goals can be found in the care plan section) Acute Rehab OT Goals Patient Stated Goal: return home OT Goal Formulation: With patient Time For Goal  Achievement: 01/24/19 Potential to Achieve Goals: Good ADL Goals Pt Will Perform Grooming: with modified independence;standing Pt Will Perform Upper Body Dressing: with modified independence;standing;sitting Pt Will Perform Lower Body Dressing: with modified independence;sit to/from stand Pt Will Transfer to Toilet: with modified independence;ambulating Additional ADL Goal #1: Pt will demonstrate independence with 3 energy conservation strategies during ADL.  OT Frequency: Min 2X/week   Barriers to D/C: Decreased caregiver support  pt lives alone       Co-evaluation              AM-PAC OT "6 Clicks" Daily Activity     Outcome Measure Help from another person eating meals?: A Little Help from another person taking care of personal grooming?: A Little Help from another person toileting, which includes using toliet, bedpan, or urinal?: A Little Help from another person bathing (including washing, rinsing, drying)?: A Lot Help from another person to put on and taking off regular upper body clothing?: A Little Help from another person to put on and taking off regular lower body clothing?: A Lot 6 Click Score: 16   End of Session Equipment Utilized During Treatment: Oxygen;Gait belt;Rolling walker Nurse Communication: Mobility status  Activity Tolerance: Patient tolerated treatment well Patient left: in chair;with call bell/phone within reach;with chair alarm set  OT Visit Diagnosis: Unsteadiness on feet (R26.81);Other abnormalities of gait and mobility (R26.89);Muscle weakness (generalized) (M62.81)  Time: 0177-9390 OT Time Calculation (min): 45 min Charges:  OT General Charges $OT Visit: 1 Visit OT Evaluation $OT Eval Moderate Complexity: 1 Mod OT Treatments $Self Care/Home Management : 23-37 mins  Dorinda Hill OTR/L Acute Rehabilitation Services Office: [place] 01/10/2019, 3:33 PM

## 2019-01-10 NOTE — Progress Notes (Signed)
PROGRESS NOTE    Chelsea Allison  EVO:[id number] DOB: 03-29-1955 DOA: 01/08/2019 PCP: Beckie Salts, MD   Brief Narrative:  Patient is a 63 year old female with history of COPD not on home oxygen, coronary disease status post stent placement, anal cancer status post colostomy, non-small cell lung cancer stage III, status post partial lung resection, skin cancer who presented with shortness of breath and chest discomfort.  On arrival, she was found to be acidotic, hypercarbic and had to be intubated.  She was admitted under ICU service.  Finally extubated and handed over to [facility] team on 01/09/2019.  Hospital course remarkable for persistent right upper quadrant and epigastric pain.  Right upper ultrasound questionable for cholecystitis.  Undergoing HIDA scan today.  Assessment & Plan:   Active Problems:   Respiratory failure with hypoxia and hypercapnia (HCC)   Acute on chronic hypoxic/hypercarbic respiratory failure/COPD exacerbation: Presented with shortness of breath.  Found to be acidotic, hypercarbic on presentation.  Had to be intubated.  Finally extubated and currently on nasal cannula.  She is not on oxygen at home.  Will check if she qualifies for home oxygen.  Continue steroids, bronchodilators. Respiratory status has improved. She is still smoking 4 to 5 cigarettes a day.  Chest pain/epigastric pain/right upper quadrant pain: She denies any chest pain today.  Her right upper quadrant still tender. RUQ US showed Cholelithiasis and borderline gallbladder wall thickening with a positive sonographic Murphy sign, no pericholestatic fluid.  Will check HIDA scan.  Continue PPI.  No fever.  Acute on chronic systolic CHF: Last chest x-ray showed some fluid overload.  Ejection fraction 40 to 45% as per echocardiogram, similar to previous echocardiogram.  Most likely ischemic cardiomyopathy due to recent STEMI. continue current diuretic therapy.  Cardiology was also following.  Acute kidney  injury: Improved  Anal cancer/lung cancer: Follows with Dr. [facility]aird [facility] at [facility]  Coronary artery disease: Had STEMI early October 2020.  Cardiac cath showed proximal RCA lesion with 80% stenosis, status post drug-eluting stent .She had  elevated troponin on this admission also.  Most likely supply demand ischemia.  Cardiology was following.  Continue aspirin, Brilinta and statin  Leukocytosis: Most likely secondary to steroids.  We will continue to monitor  Hypertension: Current blood pressure stable.  Antihypertensives held due to hypotension previously.           DVT prophylaxis:SCD Code Status: Full Family Communication: None present at the bedside Disposition Plan: Home likely tomorrow   Consultants: Cardiology, PCCM  Procedures: Intubation  Antimicrobials:  Anti-infectives (From admission, onward)   Start     Dose/Rate Route Frequency Ordered Stop   01/10/19 1000  azithromycin (ZITHROMAX) tablet 250 mg  Status:  Discontinued     250 mg Oral Daily 01/09/19 1048 01/09/19 1127   01/10/19 1000  azithromycin (ZITHROMAX) tablet 250 mg     250 mg Oral Daily 01/09/19 1127 01/13/19 0959   01/09/19 1100  azithromycin (ZITHROMAX) tablet 500 mg  Status:  Discontinued     500 mg Oral Daily 01/09/19 1048 01/09/19 1127   01/08/19 0945  azithromycin (ZITHROMAX) 500 mg in sodium chloride 0.9 % 250 mL IVPB  Status:  Discontinued     500 mg 250 mL/hr over 60 Minutes Intravenous Every 24 hours 01/08/19 0942 01/09/19 1048      Subjective:  Patient seen and examined the bedside this morning.  Feels much better.  Respiratory status stable.  Denies any shortness of breath.  Still complains of right  upper quadrant, epigastric pain.  No nausea vomiting or vomiting or diarrhea.  Objective: Vitals:   01/10/19 0426 01/10/19 0500 01/10/19 0603 01/10/19 0723  BP:  (!) 145/65  111/65  Pulse: 98 (!) 102 96 100  Resp: (!) 24 (!) 28 (!) 22 20  Temp:    98.3 F (36.8 C)  TempSrc:     Oral  SpO2: 92% 92% 96% 94%  Weight:      Height:        Intake/Output Summary (Last 24 hours) at 01/10/2019 1104 Last data filed at 01/10/2019 0500 Gross per 24 hour  Intake 299.97 ml  Output 995 ml  Net -695.03 ml   Filed Weights   01/08/19 0340 01/09/19 0454  Weight: 84 kg 84.2 kg    Examination:  General exam: ,Not in distress, pleasant female HEENT:PERRL,Oral mucosa moist, Ear/Nose normal on gross exam Respiratory system: Bilateral mild expiratory wheezes, decreased air entry Cardiovascular system: S1 & S2 heard, RRR. No JVD, murmurs, rubs, gallops or clicks. No pedal edema. Gastrointestinal system: Abdomen is nondistended, soft, tenderness in the right upper quadrant no organomegaly or masses felt. Normal bowel sounds heard. Central nervous system: Alert and oriented. No focal neurological deficits. Extremities: No edema, no clubbing ,no cyanosis, distal peripheral pulses palpable. Skin: No rashes, lesions or ulcers,no icterus ,no pallor    Data Reviewed: I have personally reviewed following labs and imaging studies  CBC: Recent Labs  Lab 01/08/19 0306 01/08/19 0439 01/08/19 0836 01/08/19 0837 01/09/19 0549 01/10/19 0412  WBC 13.1*  --  7.6  --  8.7 15.4*  NEUTROABS 5.3  --   --   --   --   --   HGB 14.2 12.9 12.8 12.9 12.8 12.9  HCT 47.4* 38.0 42.4 38.0 40.1 41.8  MCV 92.2  --  90.2  --  85.5 86.9  PLT 508*  --  396  --  396 409*   Basic Metabolic Panel: Recent Labs  Lab 01/08/19 0306 01/08/19 0439 01/08/19 0836 01/08/19 0837 01/09/19 0549 01/10/19 0412  NA 138 140  --  139 138 142  K 5.3* 4.3  --  4.4 3.3* 4.1  CL 105  --   --   --  102 105  CO2 18*  --   --   --  23 24  GLUCOSE 266*  --   --   --  172* 129*  BUN 15  --   --   --  22 25*  CREATININE 1.26*  --  0.97  --  0.96 0.80  CALCIUM 9.3  --   --   --  9.5 9.9  MG  --   --  2.2  --  2.0  --   PHOS  --   --  4.8*  --  3.4  --    GFR: Estimated Creatinine Clearance: 82.9 mL/min (by C-G  formula based on SCr of 0.8 mg/dL). Liver Function Tests: Recent Labs  Lab 01/08/19 0306 01/10/19 0412  AST 27 19  ALT 12 12  ALKPHOS 89 65  BILITOT 0.6 0.7  PROT 6.8 6.6  ALBUMIN 3.2* 3.4*   No results for input(s): LIPASE, AMYLASE in the last 168 hours. No results for input(s): AMMONIA in the last 168 hours. Coagulation Profile: No results for input(s): INR, PROTIME in the last 168 hours. Cardiac Enzymes: No results for input(s): CKTOTAL, CKMB, CKMBINDEX, TROPONINI in the last 168 hours. BNP (last 3 results) No results for input(s): PROBNP in the  last 8760 hours. HbA1C: Recent Labs    01/10/19 0412  HGBA1C 5.7*   CBG: Recent Labs  Lab 01/09/19 1133 01/09/19 1446 01/09/19 1722 01/09/19 2103 01/10/19 0722  GLUCAP 266* 138* 193* 183* 103*   Lipid Profile: No results for input(s): CHOL, HDL, LDLCALC, TRIG, CHOLHDL, LDLDIRECT in the last 72 hours. Thyroid Function Tests: No results for input(s): TSH, T4TOTAL, FREET4, T3FREE, THYROIDAB in the last 72 hours. Anemia Panel: No results for input(s): VITAMINB12, FOLATE, FERRITIN, TIBC, IRON, RETICCTPCT in the last 72 hours. Sepsis Labs: Recent Labs  Lab 01/08/19 0836 01/08/19 0839 01/08/19 1232  PROCALCITON 2.17  --   --   LATICACIDVEN  --  0.7 1.0    Recent Results (from the past 240 hour(s))  SARS Coronavirus 2 by RT PCR (hospital order, performed in [facility] hospital lab) Nasopharyngeal Nasopharyngeal Swab     Status: None   Collection Time: 01/08/19  3:06 AM   Specimen: Nasopharyngeal Swab  Result Value Ref Range Status   SARS Coronavirus 2 NEGATIVE NEGATIVE Final    Comment: (NOTE) If result is NEGATIVE SARS-CoV-2 target nucleic acids are NOT DETECTED. The SARS-CoV-2 RNA is generally detectable in upper and lower  respiratory specimens during the acute phase of infection. The lowest  concentration of SARS-CoV-2 viral copies this assay can detect is 250  copies / mL. A negative result does not preclude  SARS-CoV-2 infection  and should not be used as the sole basis for treatment or other  patient management decisions.  A negative result may occur with  improper specimen collection / handling, submission of specimen other  than nasopharyngeal swab, presence of viral mutation(s) within the  areas targeted by this assay, and inadequate number of viral copies  (<250 copies / mL). A negative result must be combined with clinical  observations, patient history, and epidemiological information. If result is POSITIVE SARS-CoV-2 target nucleic acids are DETECTED. The SARS-CoV-2 RNA is generally detectable in upper and lower  respiratory specimens dur ing the acute phase of infection.  Positive  results are indicative of active infection with SARS-CoV-2.  Clinical  correlation with patient history and other diagnostic information is  necessary to determine patient infection status.  Positive results do  not rule out bacterial infection or co-infection with other viruses. If result is PRESUMPTIVE POSTIVE SARS-CoV-2 nucleic acids MAY BE PRESENT.   A presumptive positive result was obtained on the submitted specimen  and confirmed on repeat testing.  While 2019 novel coronavirus  (SARS-CoV-2) nucleic acids may be present in the submitted sample  additional confirmatory testing may be necessary for epidemiological  and / or clinical management purposes  to differentiate between  SARS-CoV-2 and other Sarbecovirus currently known to infect humans.  If clinically indicated additional testing with an alternate test  methodology [phone]) is advised. The SARS-CoV-2 RNA is generally  detectable in upper and lower respiratory sp ecimens during the acute  phase of infection. The expected result is Negative. Fact Sheet for Patients:  StrictlyIdeas.no Fact Sheet for Healthcare Providers: BankingDealers.co.za This test is not yet approved or cleared by the  [place] FDA and has been authorized for detection and/or diagnosis of SARS-CoV-2 by FDA under an Emergency Use Authorization (EUA).  This EUA will remain in effect (meaning this test can be used) for the duration of the COVID-19 declaration under Section 564(b)(1) of the Act, 21 U.S.C. section 360bbb-3(b)(1), unless the authorization is terminated or revoked sooner. Performed at [facility], [place]  [address], [place], [place] [address]   MRSA PCR Screening     Status: None   Collection Time: 01/08/19 11:28 AM   Specimen: Nasopharyngeal  Result Value Ref Range Status   MRSA by PCR NEGATIVE NEGATIVE Final    Comment:        The GeneXpert MRSA Assay (FDA approved for NASAL specimens only), is one component of a comprehensive MRSA colonization surveillance program. It is not intended to diagnose MRSA infection nor to guide or monitor treatment for MRSA infections. Performed at [facility], [place] [address] [place], [place] [address]          Radiology Studies: US Abdomen Complete  Result Date: 01/09/2019 CLINICAL DATA:  Several days of right upper quadrant pain EXAM: ABDOMEN ULTRASOUND COMPLETE COMPARISON:  CTA chest 01/08/2019 abdominal radiograph 12/15/2018 FINDINGS: Gallbladder: Multiple echogenic, posteriorly shadowing gallstones appear to layer dependently within the gallbladder. There is borderline gallbladder wall thickening. No pericholecystic fluid is seen. Sonographic Percell Miller sign is reportedly positive however. Common bile duct: Diameter: 5 mm Liver: Diffusely increased hepatic echogenicity with loss of definition of the portal triads and diminished posterior through transmission compatible with hepatic steatosis. No focal lesion identified. Portal vein is patent on color Doppler imaging with normal direction of blood flow towards the liver. IVC: No abnormality visualized. Pancreas: Visualized portion unremarkable. Spleen: Size and appearance within normal  limits. Right Kidney: Length: 11.1 cm. Echogenicity within normal limits. No mass or hydronephrosis visualized. Left Kidney: Length: 10.2 cm. Echogenicity within normal limits. 1.4 x 0.9 x 1.0 cm anechoic cyst seen in the upper pole left kidney. No concerning renal mass or hydronephrosis. Abdominal aorta: No aneurysm visualized. Other findings: None. IMPRESSION: Cholelithiasis and borderline gallbladder wall thickening with a positive sonographic Murphy sign. While there is absence of pericholecystic fluid, such findings could reflect an early or developing cholecystitis in the appropriate clinical context. Simple appearing left renal cyst. Hepatic steatosis. Electronically Signed   By: Lovena Le M.D.   On: 01/09/2019 14:44   Dg Chest Port 1 View  Result Date: 01/09/2019 CLINICAL DATA:  Respiratory failure EXAM: PORTABLE CHEST 1 VIEW COMPARISON:  January 08, 2019 FINDINGS: The patient has been extubated. There are stable postsurgical changes of the right lung field. The heart size is stable. Aortic calcifications are noted. There is no pneumothorax. There is mild vascular congestion without overt pulmonary edema. There is no acute osseous abnormality. IMPRESSION: Status post extubation, otherwise stable appearance of the chest. Electronically Signed   By: Constance Holster M.D.   On: 01/09/2019 05:41        Scheduled Meds: . aspirin  81 mg Oral Daily  . azithromycin  250 mg Oral Daily  . budesonide (PULMICORT) nebulizer solution  0.25 mg Nebulization BID  . Chlorhexidine Gluconate Cloth  6 each Topical Daily  . furosemide  40 mg Oral Daily  . heparin  5,000 Units Subcutaneous Q8H  . insulin aspart  0-15 Units Subcutaneous TID WC  . insulin aspart  0-5 Units Subcutaneous QHS  . insulin glargine  7 Units Subcutaneous Daily  . ipratropium  0.5 mg Nebulization TID  . levalbuterol  0.63 mg Nebulization TID  . methylPREDNISolone (SOLU-MEDROL) injection  60 mg Intravenous Daily  . pantoprazole   40 mg Oral Daily  . potassium chloride  20 mEq Oral Daily  . rosuvastatin  10 mg Oral q1800  . ticagrelor  90 mg Oral BID   Continuous Infusions: . sodium chloride 10 mL/hr at 01/09/19  1500  . sodium chloride       LOS: 2 days    Time spent: 25 mins.More than 50% of that time was spent in counseling and/or coordination of care.      Shelly Coss, MD [facility] Pager [phone]  If 7PM-7AM, please contact night-coverage [email or web address] Password [email or web address] 01/10/2019, 11:04 AM

## 2019-01-10 NOTE — Progress Notes (Signed)
SATURATION QUALIFICATIONS: (This note is used to comply with regulatory documentation for home oxygen)  Patient Saturations on Room Air at Rest = 92%  Patient Saturations on Room Air while Ambulating = 86%  Patient Saturations on 2 Liters of oxygen while Ambulating = 90%  Please briefly explain why patient needs home oxygen: Patient experiences shortness of breath and oxygen desaturation with activity and ambulation

## 2019-01-11 LAB — CBC
HCT: 41.7 % (ref 36.0–46.0)
Hemoglobin: 12.9 g/dL (ref 12.0–15.0)
MCH: 27.2 pg (ref 26.0–34.0)
MCHC: 30.9 g/dL (ref 30.0–36.0)
MCV: 88 fL (ref 80.0–100.0)
Platelets: 410 10*3/uL — ABNORMAL HIGH (ref 150–400)
RBC: 4.74 MIL/uL (ref 3.87–5.11)
RDW: 15.1 % (ref 11.5–15.5)
WBC: 11.2 10*3/uL — ABNORMAL HIGH (ref 4.0–10.5)
nRBC: 0 % (ref 0.0–0.2)

## 2019-01-11 LAB — GLUCOSE, CAPILLARY: Glucose-Capillary: 170 mg/dL — ABNORMAL HIGH (ref 70–99)

## 2019-01-11 MED ORDER — FUROSEMIDE 40 MG PO TABS: 40.0000 mg | ORAL_TABLET | Freq: Every day | ORAL | 0 refills | Status: DC

## 2019-01-11 MED ORDER — PREDNISONE 20 MG PO TABS: 40.0000 mg | ORAL_TABLET | Freq: Every day | ORAL | 0 refills | Status: AC

## 2019-01-11 MED ORDER — AZITHROMYCIN 250 MG PO TABS: 250.0000 mg | ORAL_TABLET | Freq: Once | ORAL | 0 refills | Status: AC

## 2019-01-11 MED ORDER — PANTOPRAZOLE SODIUM 40 MG PO TBEC: 40.0000 mg | DELAYED_RELEASE_TABLET | Freq: Every day | ORAL | 0 refills | Status: DC

## 2019-01-11 MED ORDER — POTASSIUM CHLORIDE CRYS ER 20 MEQ PO TBCR: 20.0000 meq | EXTENDED_RELEASE_TABLET | Freq: Every day | ORAL | 0 refills | Status: DC

## 2019-01-11 NOTE — Care Management Important Message (Signed)
Important Message  Patient Details  Name: Chelsea Allison MRN: [id number] Date of Birth: 11-03-1955   Medicare Important Message Given:  Yes     Ethan Clayburn 01/11/2019, 1:50 PM

## 2019-01-11 NOTE — Discharge Summary (Signed)
Physician Discharge Summary  Chelsea Allison HCW:[id number] DOB: June 25, 1955 DOA: 01/08/2019  PCP: Beckie Salts, MD  Admit date: 01/08/2019 Discharge date: 01/11/2019  Admitted From: Home Disposition:  Home  Discharge Condition:Stable CODE STATUS:FULL Diet recommendation: Heart Healthy  Brief/Interim Summary: Patient is a 63 year old female with history of COPD not on home oxygen, coronary disease status post stent placement, anal cancer status post colostomy, non-small cell lung cancer stage III, status post partial lung resection, skin cancer who presented with shortness of breath and chest discomfort.  On arrival, she was found to be acidotic, hypercarbic and had to be intubated.  She was admitted under ICU service.  Finally extubated and handed over to [facility] team on 01/09/2019.  Hospital course remarkable for persistent right upper quadrant and epigastric pain.  Right upper ultrasound questionable for cholecystitis.  Negative  HIDA .  Her respiratory status is stable now.  She qualified for home oxygen.  Abdomen pain resolved.  She is hemodynamically stable for discharge home today.  Following problems were addressed during her hospitalization:  Acute on chronic hypoxic/hypercarbic respiratory failure/COPD exacerbation: Presented with shortness of breath.  Found to be acidotic, hypercarbic on presentation.  Had to be intubated.  Finally extubated and currently on nasal cannula.  She is not on oxygen at home.  Will check if she qualifies for home oxygen.  Continue steroids, bronchodilators. Respiratory status has improved. She is still smoking 4 to 5 cigarettes a day.  Chest pain/epigastric pain/right upper quadrant pain: She denies any chest pain today.  Her right upper quadrant still tender. RUQ US showed Cholelithiasis and borderline gallbladder wall thickening with a positive sonographic Murphy sign, no pericholestatic fluid.  Negative HIDA scan.  Continue PPI.  Acute on chronic systolic  CHF: Last chest x-ray showed some fluid overload.  Ejection fraction 40 to 45% as per echocardiogram, similar to previous echocardiogram.  Most likely ischemic cardiomyopathy due to recent STEMI. continue current diuretic therapy.  Cardiology was also following.  Acute kidney injury: Improved  Anal cancer/lung cancer: Follows with Dr. Baird Cancer at [facility]  Coronary artery disease: Had STEMI early October 2020.  Cardiac cath showed proximal RCA lesion with 80% stenosis, status post drug-eluting stent .She had  elevated troponin on this admission also.  Most likely supply demand ischemia.  Cardiology was following.  Continue aspirin, Brilinta and statin  Leukocytosis: Most likely secondary to steroids. Improved  Hypertension: Current blood pressure stable.  Resume home antihypertensives.  Discharge Diagnoses:  Active Problems:   CAD in native artery   Respiratory failure with hypoxia and hypercapnia (HCC)   Renal insufficiency   Chronic combined systolic and diastolic heart failure [facility])    Discharge Instructions  Discharge Instructions    Diet - low sodium heart healthy   Complete by: As directed    Discharge instructions   Complete by: As directed    1)Take prescribed medications as instructed. 2)Follow up with your cardiologist and pulmonologist as an outpatient 3)Please stop smoking   Increase activity slowly   Complete by: As directed      Allergies as of 01/11/2019      Reactions   Hydrochlorothiazide Other (See Comments)   headaches   Hydrocodone Nausea And Vomiting   Lisinopril Other (See Comments)   Headache   Oxycodone Nausea Only   Statins Other (See Comments)      Medication List    TAKE these medications   albuterol 108 (90 Base) MCG/ACT inhaler Commonly known as: VENTOLIN HFA Inhale  2 puffs into the lungs every 4 (four) hours as needed for shortness of breath or wheezing.   ALPRAZolam 0.5 MG tablet Commonly known as: XANAX Take 0.5  mg by mouth 2 (two) times daily as needed for anxiety.   aspirin 81 MG chewable tablet Chew 1 tablet (81 mg total) by mouth daily.   azithromycin 250 MG tablet Commonly known as: ZITHROMAX Take 1 tablet (250 mg total) by mouth once for 1 dose. Start taking on: January 12, 2019   Breo Ellipta 200-25 MCG/INH Aepb Generic drug: fluticasone furoate-vilanterol Inhale 1 puff into the lungs daily.   furosemide 40 MG tablet Commonly known as: LASIX Take 1 tablet (40 mg total) by mouth daily. Start taking on: January 12, 2019   gabapentin 300 MG capsule Commonly known as: NEURONTIN Take 300 mg by mouth 3 (three) times daily.   ipratropium-albuterol 0.5-2.5 (3) MG/3ML Soln Commonly known as: DUONEB Inhale 3 mLs into the lungs every 4 (four) hours as needed for shortness of breath or wheezing.   losartan 25 MG tablet Commonly known as: COZAAR Take 0.5 tablets (12.5 mg total) by mouth daily.   metoprolol succinate 25 MG 24 hr tablet Commonly known as: TOPROL-XL Take 1 tablet (25 mg total) by mouth daily.   nitroGLYCERIN 0.4 MG SL tablet Commonly known as: NITROSTAT Place 1 tablet (0.4 mg total) under the tongue every 5 (five) minutes x 3 doses as needed for chest pain.   pantoprazole 40 MG tablet Commonly known as: PROTONIX Take 1 tablet (40 mg total) by mouth daily. Start taking on: January 12, 2019   potassium chloride SA 20 MEQ tablet Commonly known as: KLOR-CON Take 1 tablet (20 mEq total) by mouth daily. Start taking on: January 12, 2019   predniSONE 20 MG tablet Commonly known as: DELTASONE Take 2 tablets (40 mg total) by mouth daily with breakfast for 4 days. Start taking on: January 12, 2019   rosuvastatin 10 MG tablet Commonly known as: CRESTOR Take 1 tablet (10 mg total) by mouth daily at 6 PM.   ticagrelor 90 MG Tabs tablet Commonly known as: BRILINTA Take 1 tablet (90 mg total) by mouth 2 (two) times daily.   traMADol 50 MG tablet Commonly known as:  ULTRAM Take 100 mg by mouth every 6 (six) hours as needed for moderate pain.   Vitamin D (Ergocalciferol) 1.25 MG (50000 UT) Caps capsule Commonly known as: DRISDOL Take 50,000 Units by mouth every 7 (seven) days.            Durable Medical Equipment  (From admission, onward)         Start     Ordered   01/11/19 1040  For home use only DME oxygen  Once    Question Answer Comment  Length of Need Lifetime   Mode or (Route) Nasal cannula   Liters per Minute 2   Frequency Continuous (stationary and portable oxygen unit needed)   Oxygen delivery system Gas      01/11/19 1040         Follow-up Information    Beckie Salts, MD. Schedule an appointment as soon as possible for a visit in 1 week(s).   Specialty: Internal Medicine         Allergies  Allergen Reactions  . Hydrochlorothiazide Other (See Comments)    headaches  . Hydrocodone Nausea And Vomiting  . Lisinopril Other (See Comments)    Headache   . Oxycodone Nausea Only  . Statins Other (See  Comments)    Consultations:  PCCM,Cardiology   Procedures/Studies: Dg Abd 1 View  Result Date: 12/12/2018 CLINICAL DATA:  OG tube placement. EXAM: ABDOMEN - 1 VIEW COMPARISON:  None. FINDINGS: An OG tube is noted with tip overlying the mid stomach. IMPRESSION: OG tube with tip overlying the mid stomach. Electronically Signed   By: Margarette Canada M.D.   On: 12/12/2018 19:13   Ct Head Wo Contrast  Result Date: 12/12/2018 CLINICAL DATA:  63 year old female with altered mental status. Presented with acute myocardial infarction and cardiac catheterization. EXAM: CT HEAD WITHOUT CONTRAST TECHNIQUE: Contiguous axial images were obtained from the base of the skull through the vertex without intravenous contrast. COMPARISON:  01/08/2017 MR FINDINGS: Brain: No evidence of acute infarction, hemorrhage, hydrocephalus, extra-axial collection or mass lesion/mass effect. Very mild chronic small-vessel white matter ischemic changes  again noted. Vascular: Carotid atherosclerotic calcifications noted. Skull: Normal. Negative for fracture or focal lesion. Sinuses/Orbits: No acute abnormality. Chronic mucosal thickening within sphenoid sinuses noted. Other: None IMPRESSION: 1. No evidence of acute intracranial abnormality. 2. Mild chronic small-vessel white matter ischemic changes. Electronically Signed   By: Margarette Canada M.D.   On: 12/12/2018 14:56   Ct Angio Chest Pe W And/or Wo Contrast  Result Date: 01/08/2019 CLINICAL DATA:  Shortness of breath EXAM: CT ANGIOGRAPHY CHEST WITH CONTRAST TECHNIQUE: Multidetector CT imaging of the chest was performed using the standard protocol during bolus administration of intravenous contrast. Multiplanar CT image reconstructions and MIPs were obtained to evaluate the vascular anatomy. CONTRAST:  40mL OMNIPAQUE IOHEXOL 350 MG/ML SOLN COMPARISON:  None. FINDINGS: Cardiovascular: --Pulmonary arteries: Contrast injection is sufficient to demonstrate satisfactory opacification of the pulmonary arteries to the segmental level, with attenuation of at least 200 HU at the main pulmonary artery. There is no pulmonary embolus. The main pulmonary artery is within normal limits for size. --Aorta: Limited opacification of the aorta due to bolus timing optimization for the pulmonary arteries. The course and caliber of the aorta are normal. Conventional 3 vessel aortic branching pattern. There is moderate aortic atherosclerosis. --Heart: Normal size. No pericardial effusion. There are coronary artery calcifications. Mediastinum/Nodes: No mediastinal, hilar or axillary lymphadenopathy. The visualized thyroid and thoracic esophageal course are unremarkable. Lungs/Pleura: There is moderate pulmonary edema with bilateral dependent atelectasis. There is scarring along the medial right hemithorax that has the appearance of radiation fibrosis. There is an 8 mm right middle lobe nodule and an 11 mm left lower lobe nodule. Both of  these may be inflammatory. Upper Abdomen: Contrast bolus timing is not optimized for evaluation of the abdominal organs. The visualized portions of the organs of the upper abdomen are normal. Musculoskeletal: No chest wall abnormality. No bony spinal canal stenosis. Review of the MIP images confirms the above findings. IMPRESSION: 1. No pulmonary embolus. 2. Moderate pulmonary edema and bilateral dependent atelectasis. 3. Postradiation fibrosis of the medial right hemithorax. 4. Coronary artery and aortic Atherosclerosis (ICD10-I70.0). Electronically Signed   By: Ulyses Jarred M.D.   On: 01/08/2019 05:26   US Abdomen Complete  Result Date: 01/09/2019 CLINICAL DATA:  Several days of right upper quadrant pain EXAM: ABDOMEN ULTRASOUND COMPLETE COMPARISON:  CTA chest 01/08/2019 abdominal radiograph 12/15/2018 FINDINGS: Gallbladder: Multiple echogenic, posteriorly shadowing gallstones appear to layer dependently within the gallbladder. There is borderline gallbladder wall thickening. No pericholecystic fluid is seen. Sonographic Percell Miller sign is reportedly positive however. Common bile duct: Diameter: 5 mm Liver: Diffusely increased hepatic echogenicity with loss of definition of the portal triads and  diminished posterior through transmission compatible with hepatic steatosis. No focal lesion identified. Portal vein is patent on color Doppler imaging with normal direction of blood flow towards the liver. IVC: No abnormality visualized. Pancreas: Visualized portion unremarkable. Spleen: Size and appearance within normal limits. Right Kidney: Length: 11.1 cm. Echogenicity within normal limits. No mass or hydronephrosis visualized. Left Kidney: Length: 10.2 cm. Echogenicity within normal limits. 1.4 x 0.9 x 1.0 cm anechoic cyst seen in the upper pole left kidney. No concerning renal mass or hydronephrosis. Abdominal aorta: No aneurysm visualized. Other findings: None. IMPRESSION: Cholelithiasis and borderline gallbladder  wall thickening with a positive sonographic Murphy sign. While there is absence of pericholecystic fluid, such findings could reflect an early or developing cholecystitis in the appropriate clinical context. Simple appearing left renal cyst. Hepatic steatosis. Electronically Signed   By: Lovena Le M.D.   On: 01/09/2019 14:44   Nm Hepato W/eject Fract  Result Date: 01/10/2019 CLINICAL DATA:  Right upper quadrant pain EXAM: NUCLEAR MEDICINE HEPATOBILIARY IMAGING WITH GALLBLADDER EF VIEWS: Anterior right upper quadrant RADIOPHARMACEUTICALS:  5.0 mCi Tc-53m  Choletec IV COMPARISON:  None. FINDINGS: Liver uptake of radiotracer is unremarkable. There is prompt visualization of gallbladder and small bowel, indicating patency of the cystic and common bile ducts. The patient consumed 8 ounces of Ensure Complete orally with calculation of the computer generated ejection fraction of radiotracer from the gallbladder. The patient did not report clinical symptoms with the oral Ensure consumption. The computer generated ejection fraction of radiotracer from the gallbladder is normal at 49%, normal greater than 33% using the oral agent. IMPRESSION: Study within normal limits. Electronically Signed   By: Lowella Grip III M.D.   On: 01/10/2019 13:15   Dg Chest Port 1 View  Result Date: 01/09/2019 CLINICAL DATA:  Respiratory failure EXAM: PORTABLE CHEST 1 VIEW COMPARISON:  January 08, 2019 FINDINGS: The patient has been extubated. There are stable postsurgical changes of the right lung field. The heart size is stable. Aortic calcifications are noted. There is no pneumothorax. There is mild vascular congestion without overt pulmonary edema. There is no acute osseous abnormality. IMPRESSION: Status post extubation, otherwise stable appearance of the chest. Electronically Signed   By: Constance Holster M.D.   On: 01/09/2019 05:41   Dg Chest Portable 1 View  Result Date: 01/08/2019 CLINICAL DATA:  Intubation EXAM:  PORTABLE CHEST 1 VIEW COMPARISON:  12/17/2018 FINDINGS: The endotracheal tube terminates approximately 2.8 cm above the carina. The enteric tube extends below lead left hemidiaphragm. There is right lung volume loss consistent with the patient's history of a prior right upper lobe lobectomy. Dense consolidation is noted in the right perihilar region which is similar to prior studies. The heart size is stable. Aortic calcifications are again noted. There is no pneumothorax. No significant pleural effusion, however the left costophrenic angle is not fully visualized. IMPRESSION: 1. Lines and tubes as above. 2. Stable appearance of the chest with right-sided volume loss and post radiation changes in the medial right upper lung zone. Electronically Signed   By: Constance Holster M.D.   On: 01/08/2019 03:45   Dg Chest Port 1 View  Result Date: 12/17/2018 CLINICAL DATA:  63 year old female with acute respiratory failure. EXAM: PORTABLE CHEST 1 VIEW COMPARISON:  Prior chest x-ray 17 Jul 2026 FINDINGS: The tip of the endotracheal tube is 2.1 cm above the carina. Left IJ central venous catheter remains in place. However, the tip is changed location and is now elective within the azygos  vein. Gastric tube remains present. The tip is not identified but lies below the field of view. Stable cardiac and mediastinal contours. Atherosclerotic calcifications again noted in the transverse aorta. Post radiation changes in the medial right upper lobe with associated peaking of the right hemidiaphragm. No new background airspace opacity, pulmonary edema, pleural effusion or pneumothorax. No acute osseous abnormality. IMPRESSION: 1. The tip of the left IJ approach central venous catheter has migrated the azygos vein. 2. Other support apparatus in satisfactory position. 3. Otherwise, stable appearance of the chest.  No acute findings. Electronically Signed   By: Jacqulynn Cadet M.D.   On: 12/17/2018 09:48   Dg Chest Port 1  View  Result Date: 12/15/2018 CLINICAL DATA:  Central line placement. EXAM: PORTABLE CHEST 1 VIEW COMPARISON:  Earlier same day as well as 12/13/2018 and 12/12/2018 FINDINGS: Endotracheal tube has tip 4.6 cm from the carina. Interval placement of left IJ central venous catheter with tip over the SVC. Lungs are adequately inflated with stable chronic volume loss of the right lung. No pneumothorax. Persistent airspace opacification over the right mid to upper lung likely infection. Postsurgical/post radiation changes over the right paramediastinal region. Remainder of the exam is unchanged. IMPRESSION: Stable hazy airspace process over the right mid to upper lung likely infection. Chronic postsurgical/post radiation change and volume loss of the right lung. Tubes and lines as described.  No pneumothorax. Electronically Signed   By: Marin Olp M.D.   On: 12/15/2018 19:43   Portable Chest X-ray  Result Date: 12/15/2018 CLINICAL DATA:  Endotracheal tube placement. EXAM: PORTABLE CHEST 1 VIEW COMPARISON:  12/15/2018 earlier. FINDINGS: Interval placement of endotracheal tube with tip 4.6 cm above the carina. Persistent hazy airspace opacification over the right mid to upper lung likely infection. Postsurgical/post radiation change over the right paramediastinal region with stable volume loss of the right lung. No effusion. Cardiomediastinal silhouette and remainder of the exam is unchanged. IMPRESSION: Persistent airspace process over the right mid to upper lung likely infection. Endotracheal tube with tip 4.6 cm above the carina. Electronically Signed   By: Marin Olp M.D.   On: 12/15/2018 18:59   Dg Chest Port 1 View  Result Date: 12/15/2018 CLINICAL DATA:  Shortness of breath. Known history of right lung cancer. EXAM: PORTABLE CHEST 1 VIEW COMPARISON:  12/13/2018 FINDINGS: Lungs are adequately inflated and demonstrate slight worsening airspace opacification over the right mid to upper lung likely  infection. No evidence of effusion. Postsurgical/radiation change over the right paramediastinal region. Cardiomediastinal silhouette and remainder the exam is unchanged. IMPRESSION: Worsening airspace process over the right mid to upper lung likely infection. Electronically Signed   By: Marin Olp M.D.   On: 12/15/2018 18:57   Dg Chest Port 1 View  Result Date: 12/13/2018 CLINICAL DATA:  63 year old with acute hypoxia. EXAM: PORTABLE CHEST 1 VIEW 9:01 p.m.: COMPARISON:  Portable chest x-ray earlier same day at 5 o'clock a.m. and previously. FINDINGS: Extubation and removal of the Swan-Ganz catheter since the examination this morning. Interval development of airspace opacities in the RIGHT UPPER LOBE. Pleuroparenchymal scarring on the RIGHT with tethering of the RIGHT hemidiaphragm, unchanged. Pulmonary venous hypertension and perhaps mild interstitial pulmonary edema. Cardiac silhouette normal in size for AP portable technique. IMPRESSION: 1. Acute pneumonia in the RIGHT UPPER LOBE since the examination this morning, possibly aspiration. 2. Pulmonary venous hypertension and perhaps mild interstitial pulmonary edema, query fluid overload. Electronically Signed   By: Evangeline Dakin M.D.   On: 12/13/2018  21:09   Dg Chest Port 1 View  Result Date: 12/13/2018 CLINICAL DATA:  Endotracheal tube EXAM: PORTABLE CHEST 1 VIEW COMPARISON:  Yesterday FINDINGS: Endotracheal tube tip between the clavicular heads and carina. The orogastric tube at least reaches the stomach. Pulmonary artery catheter from below with tip overlapping the main pulmonary artery. An aortic balloon pump marker is not seen today. Right perihilar architectural distortion and increased density with radiation fibrosis appearance on 2019 CT. There is also been a PET-CT 06/27/2018, images not currently available. There is no edema, consolidation, effusion, or pneumothorax. IMPRESSION: 1. Unremarkable hardware positioning. 2. Stable aeration.  Electronically Signed   By: Monte Fantasia M.D.   On: 12/13/2018 07:54   Dg Chest Port 1 View  Result Date: 12/12/2018 CLINICAL DATA:  Chest pain. History of STEMI December 12, 2018. History of colon cancer and lung cancer. EXAM: PORTABLE CHEST 1 VIEW COMPARISON:  November 18, 2016 FINDINGS: Fibrotic changes are seen in the right suprahilar region, more masslike in appearance compared to November 18, 2016. Pleural thickening is seen in the right apex, likely post therapeutic. Mild opacity is seen in the right mid lung, peripherally. Mild increased interstitial markings in the upper lobes. The heart, left hilum, and mediastinum are unchanged. An ETT is in good position. An intra aortic balloon pump terminates 7.5 cm below the thoracic aortic arch. The distal tip of a PA catheter from an inferior approach projects over the left hilum. IMPRESSION: 1. The ETT is in good position. 2. An intra-aortic balloon pump appears to have been placed, terminating 7.5 cm below the top of the aortic arch. 3. The PA catheter approaches via the inferior vena cava with the distal tip projected over the proximal outflow tract. 4. Increased interstitial markings in the apices may represent mild pulmonary venous congestion. 5. Increasing masslike opacity in the right suprahilar region. Fibrotic change was seen in this region November 18, 2016. The increased fullness could represent recurrence or evolution of postradiation change. 6. More focal opacity is seen in the periphery of the right mid lung, possibly infectious. Electronically Signed   By: Dorise Bullion III M.D   On: 12/12/2018 11:25   Dg Abd Portable 1v  Result Date: 12/15/2018 CLINICAL DATA:  Enteric tube placement. EXAM: PORTABLE ABDOMEN - 1 VIEW COMPARISON:  Chest x-ray from same day. FINDINGS: Enteric tube in the mid stomach. Visualized bowel gas pattern is normal. IMPRESSION: Enteric tube in the stomach. Electronically Signed   By: Titus Dubin M.D.   On:  12/15/2018 21:58       Subjective: Patient seen and examined at the bedside this morning.  Hemodynamically stable for discharge.  Discharge Exam: Vitals:   01/11/19 0751 01/11/19 0817  BP:  126/65  Pulse:  85  Resp:    Temp:  97.8 F (36.6 C)  SpO2: 96% 98%   Vitals:   01/10/19 1953 01/10/19 2305 01/11/19 0751 01/11/19 0817  BP:  103/65  126/65  Pulse:  85  85  Resp:  14    Temp:  98.5 F (36.9 C)  97.8 F (36.6 C)  TempSrc:  Oral  Oral  SpO2: 96% 95% 96% 98%  Weight:      Height:        General: Pt is alert, awake, not in acute distress Cardiovascular: RRR, S1/S2 +, no rubs, no gallops Respiratory: CTA bilaterally, no wheezing, no rhonchi Abdominal: Soft, NT, ND, bowel sounds + Extremities: no edema, no cyanosis    The results  of significant diagnostics from this hospitalization (including imaging, microbiology, ancillary and laboratory) are listed below for reference.     Microbiology: Recent Results (from the past 240 hour(s))  SARS Coronavirus 2 by RT PCR (hospital order, performed in [facility] hospital lab) Nasopharyngeal Nasopharyngeal Swab     Status: None   Collection Time: 01/08/19  3:06 AM   Specimen: Nasopharyngeal Swab  Result Value Ref Range Status   SARS Coronavirus 2 NEGATIVE NEGATIVE Final    Comment: (NOTE) If result is NEGATIVE SARS-CoV-2 target nucleic acids are NOT DETECTED. The SARS-CoV-2 RNA is generally detectable in upper and lower  respiratory specimens during the acute phase of infection. The lowest  concentration of SARS-CoV-2 viral copies this assay can detect is 250  copies / mL. A negative result does not preclude SARS-CoV-2 infection  and should not be used as the sole basis for treatment or other  patient management decisions.  A negative result may occur with  improper specimen collection / handling, submission of specimen other  than nasopharyngeal swab, presence of viral mutation(s) within the  areas targeted by this  assay, and inadequate number of viral copies  (<250 copies / mL). A negative result must be combined with clinical  observations, patient history, and epidemiological information. If result is POSITIVE SARS-CoV-2 target nucleic acids are DETECTED. The SARS-CoV-2 RNA is generally detectable in upper and lower  respiratory specimens dur ing the acute phase of infection.  Positive  results are indicative of active infection with SARS-CoV-2.  Clinical  correlation with patient history and other diagnostic information is  necessary to determine patient infection status.  Positive results do  not rule out bacterial infection or co-infection with other viruses. If result is PRESUMPTIVE POSTIVE SARS-CoV-2 nucleic acids MAY BE PRESENT.   A presumptive positive result was obtained on the submitted specimen  and confirmed on repeat testing.  While 2019 novel coronavirus  (SARS-CoV-2) nucleic acids may be present in the submitted sample  additional confirmatory testing may be necessary for epidemiological  and / or clinical management purposes  to differentiate between  SARS-CoV-2 and other Sarbecovirus currently known to infect humans.  If clinically indicated additional testing with an alternate test  methodology [phone]) is advised. The SARS-CoV-2 RNA is generally  detectable in upper and lower respiratory sp ecimens during the acute  phase of infection. The expected result is Negative. Fact Sheet for Patients:  StrictlyIdeas.no Fact Sheet for Healthcare Providers: BankingDealers.co.za This test is not yet approved or cleared by the [place] FDA and has been authorized for detection and/or diagnosis of SARS-CoV-2 by FDA under an Emergency Use Authorization (EUA).  This EUA will remain in effect (meaning this test can be used) for the duration of the COVID-19 declaration under Section 564(b)(1) of the Act, 21 U.S.C. section 360bbb-3(b)(1),  unless the authorization is terminated or revoked sooner. Performed at [facility], [place] [address] [place],  [address]   MRSA PCR Screening     Status: None   Collection Time: 01/08/19 11:28 AM   Specimen: Nasopharyngeal  Result Value Ref Range Status   MRSA by PCR NEGATIVE NEGATIVE Final    Comment:        The GeneXpert MRSA Assay (FDA approved for NASAL specimens only), is one component of a comprehensive MRSA colonization surveillance program. It is not intended to diagnose MRSA infection nor to guide or monitor treatment for MRSA infections. Performed at [facility], [place],  [place] [address]      Labs: BNP (last 3 results) Recent Labs    12/12/18 1132 01/08/19 0306 01/09/19 0549  BNP 248.0* 915.6* 329.9*   Basic Metabolic Panel: Recent Labs  Lab 01/08/19 0306 01/08/19 0439 01/08/19 0836 01/08/19 0837 01/09/19 0549 01/10/19 0412  NA 138 140  --  139 138 142  K 5.3* 4.3  --  4.4 3.3* 4.1  CL 105  --   --   --  102 105  CO2 18*  --   --   --  23 24  GLUCOSE 266*  --   --   --  172* 129*  BUN 15  --   --   --  22 25*  CREATININE 1.26*  --  0.97  --  0.96 0.80  CALCIUM 9.3  --   --   --  9.5 9.9  MG  --   --  2.2  --  2.0  --   PHOS  --   --  4.8*  --  3.4  --    Liver Function Tests: Recent Labs  Lab 01/08/19 0306 01/10/19 0412  AST 27 19  ALT 12 12  ALKPHOS 89 65  BILITOT 0.6 0.7  PROT 6.8 6.6  ALBUMIN 3.2* 3.4*   No results for input(s): LIPASE, AMYLASE in the last 168 hours. No results for input(s): AMMONIA in the last 168 hours. CBC: Recent Labs  Lab 01/08/19 0306  01/08/19 0836 01/08/19 0837 01/09/19 0549 01/10/19 0412 01/11/19 0328  WBC 13.1*  --  7.6  --  8.7 15.4* 11.2*  NEUTROABS 5.3  --   --   --   --   --   --   HGB 14.2   < > 12.8 12.9 12.8 12.9 12.9  HCT 47.4*   < > 42.4 38.0 40.1 41.8 41.7  MCV 92.2  --  90.2  --  85.5 86.9 88.0  PLT 508*  --  396  --  396 443* 410*   < > = values  in this interval not displayed.   Cardiac Enzymes: No results for input(s): CKTOTAL, CKMB, CKMBINDEX, TROPONINI in the last 168 hours. BNP: Invalid input(s): POCBNP CBG: Recent Labs  Lab 01/10/19 0722 01/10/19 1303 01/10/19 1715 01/10/19 2104 01/11/19 0817  GLUCAP 103* 137* 221* 144* 170*   D-Dimer No results for input(s): DDIMER in the last 72 hours. Hgb A1c Recent Labs    01/10/19 0412  HGBA1C 5.7*   Lipid Profile No results for input(s): CHOL, HDL, LDLCALC, TRIG, CHOLHDL, LDLDIRECT in the last 72 hours. Thyroid function studies No results for input(s): TSH, T4TOTAL, T3FREE, THYROIDAB in the last 72 hours.  Invalid input(s): FREET3 Anemia work up No results for input(s): VITAMINB12, FOLATE, FERRITIN, TIBC, IRON, RETICCTPCT in the last 72 hours. Urinalysis No results found for: COLORURINE, APPEARANCEUR, [place], [place], [place], [place], [place], [place], PROTEINUR, UROBILINOGEN, NITRITE, LEUKOCYTESUR Sepsis Labs Invalid input(s): PROCALCITONIN,  WBC,  LACTICIDVEN Microbiology Recent Results (from the past 240 hour(s))  SARS Coronavirus 2 by RT PCR (hospital order, performed in [facility] hospital lab) Nasopharyngeal Nasopharyngeal Swab     Status: None   Collection Time: 01/08/19  3:06 AM   Specimen: Nasopharyngeal Swab  Result Value Ref Range Status   SARS Coronavirus 2 NEGATIVE NEGATIVE Final    Comment: (NOTE) If result is NEGATIVE SARS-CoV-2 target nucleic acids are NOT DETECTED. The SARS-CoV-2 RNA is generally detectable in upper and lower  respiratory specimens during the acute phase  of infection. The lowest  concentration of SARS-CoV-2 viral copies this assay can detect is 250  copies / mL. A negative result does not preclude SARS-CoV-2 infection  and should not be used as the sole basis for treatment or other  patient management decisions.  A negative result may occur with  improper specimen collection / handling, submission of specimen other   than nasopharyngeal swab, presence of viral mutation(s) within the  areas targeted by this assay, and inadequate number of viral copies  (<250 copies / mL). A negative result must be combined with clinical  observations, patient history, and epidemiological information. If result is POSITIVE SARS-CoV-2 target nucleic acids are DETECTED. The SARS-CoV-2 RNA is generally detectable in upper and lower  respiratory specimens dur ing the acute phase of infection.  Positive  results are indicative of active infection with SARS-CoV-2.  Clinical  correlation with patient history and other diagnostic information is  necessary to determine patient infection status.  Positive results do  not rule out bacterial infection or co-infection with other viruses. If result is PRESUMPTIVE POSTIVE SARS-CoV-2 nucleic acids MAY BE PRESENT.   A presumptive positive result was obtained on the submitted specimen  and confirmed on repeat testing.  While 2019 novel coronavirus  (SARS-CoV-2) nucleic acids may be present in the submitted sample  additional confirmatory testing may be necessary for epidemiological  and / or clinical management purposes  to differentiate between  SARS-CoV-2 and other Sarbecovirus currently known to infect humans.  If clinically indicated additional testing with an alternate test  methodology [phone] is advised. The SARS-CoV-2 RNA is generally  detectable in upper and lower respiratory sp ecimens during the acute  phase of infection. The expected result is Negative. Fact Sheet for Patients:  StrictlyIdeas.no Fact Sheet for Healthcare Providers: BankingDealers.co.za This test is not yet approved or cleared by the [place] FDA and has been authorized for detection and/or diagnosis of SARS-CoV-2 by FDA under an Emergency Use Authorization (EUA).  This EUA will remain in effect (meaning this test can be used) for the duration of  the COVID-19 declaration under Section 564(b)(1) of the Act, 21 U.S.C. section 360bbb-3(b)(1), unless the authorization is terminated or revoked sooner. Performed at [facility], [place] [address] [place], [place] [address]   MRSA PCR Screening     Status: None   Collection Time: 01/08/19 11:28 AM   Specimen: Nasopharyngeal  Result Value Ref Range Status   MRSA by PCR NEGATIVE NEGATIVE Final    Comment:        The GeneXpert MRSA Assay (FDA approved for NASAL specimens only), is one component of a comprehensive MRSA colonization surveillance program. It is not intended to diagnose MRSA infection nor to guide or monitor treatment for MRSA infections. Performed at [facility], [place] [address], [place], [place] [address]     Please note: You were cared for by a hospitalist during your hospital stay. Once you are discharged, your primary care physician will handle any further medical issues. Please note that NO REFILLS for any discharge medications will be authorized once you are discharged, as it is imperative that you return to your primary care physician (or establish a relationship with a primary care physician if you do not have one) for your post hospital discharge needs so that they can reassess your need for medications and monitor your lab values.    Time coordinating discharge: 40 minutes  SIGNED:   Shelly Coss, MD  [facility] 01/11/2019, 10:45 AM Pager [phone]  If 7PM-7AM, please contact night-coverage [email or web address] Password [email or web address]

## 2019-01-11 NOTE — Progress Notes (Signed)
[facility] will sign off.   Medication Recommendations:  none Other recommendations (labs, testing, etc):  none Follow up as an outpatient:  Patient to follow up with her cardiologist at [facility] as scheduled.  Obdulia Steier C. Oval Linsey, MD, [facility] 01/11/2019 8:39 AM

## 2019-01-11 NOTE — TOC Initial Note (Addendum)
Transition of Care [facility]) - Initial/Assessment Note    Patient Details  Name: Chelsea Allison MRN: [id number] Date of Birth: 11/26/1955  Transition of Care [facility]) CM/SW Contact:    Maryclare Labrador, RN Phone Number: 01/11/2019, 10:34 AM  Clinical Narrative:    Pt is a high risk readmission; pt recently admitted for STEMI, pt has multiple CA dx and severe COPD.   PTA Independent from home alone - pt informed CM that her friend will provide 24/7 supervision at discharge as recommended.  Pt informed CM that she is active with [facility] for Aide, PT.  CM informed pt of the recommendation for [facility] and pt is in agreement. Agency informed that pt is admitted and the need to add [facility] for disease management/new home oxygen - agency can provide all modalities at discharge.  Pt will discharge home on oxygen - choice given and pt chose [employer] - agency contacted and referral accepted.  Pt denied [place] [address] barriers.  Pt will purchase tub bench post dishcarge              Expected Discharge Plan: [place] Barriers to Discharge: Continued Medical Work up   Patient Goals and CMS Choice Patient states their goals for this hospitalization and ongoing recovery are:: Pt states she is ready to get home with the oxygen, pt states " I really need it" CMS Medicare.gov Compare Post Acute Care list provided to:: Patient Choice offered to / list presented to : Patient  Expected Discharge Plan and Services Expected Discharge Plan: [place] Choice: Durable Medical Equipment, Home Health Living arrangements for the past 2 months: Single Family Home                 DME Arranged: Oxygen DME Agency: [facility] Date DME Agency Contacted: 01/11/19 Time DME Agency Contacted: [id number] Representative spoke with at DME Agency: [place]: RN, PT, Nurse's Aide [place] Agency: [facility]) Date [place]: 01/11/19 Time [place]:  1033 Representative spoke with at [place]: [place]  Prior Living Arrangements/Services Living arrangements for the past 2 months: [place] with:: Self Patient language and need for interpreter reviewed:: Yes Do you feel safe going back to the place where you live?: Yes      Need for Family Participation in Patient Care: Yes (Comment) Care giver support system in place?: Yes (comment) Current home services: Homehealth aide, Home PT Criminal Activity/Legal Involvement Pertinent to Current Situation/Hospitalization: No - Comment as needed  Activities of Daily Living      Permission Sought/Granted   Permission granted to share information with : Yes, Verbal Permission Granted     Permission granted to share info w AGENCY: [facility] and [facility]        Emotional Assessment   Attitude/Demeanor/Rapport: Self-Confident, Engaged Affect (typically observed): Accepting Orientation: : Oriented to Self, Oriented to Place, Oriented to  Time, Oriented to Situation   Psych Involvement: No (comment)  Admission diagnosis:  Renal insufficiency [N28.9] Elevated d-dimer [R79.89] Acute on chronic respiratory failure with hypoxia and hypercapnia (HCC) [K74.25, J96.22] Patient Active Problem List   Diagnosis Date Noted  . Renal insufficiency   . Chronic combined systolic and diastolic heart failure ([place])   . Respiratory failure with hypoxia and hypercapnia ([place]) 01/08/2019  . Hyperlipidemia 12/20/2018  . Hypertension 12/20/2018  . Pneumonia 12/20/2018  . Tobacco abuse 12/20/2018  . CAD  in native artery 12/20/2018  . Unstable angina ([place])   . Hypoxemia   . Acute respiratory failure with hypoxemia ([place])   . Acute pulmonary edema (HCC)   . COPD with acute exacerbation ([place])   . STEMI (ST elevation myocardial infarction) ([place]) 12/12/2018  . Acute MI, lateral wall ([place]) 12/12/2018  . Acute on chronic respiratory failure with hypercapnia ([place])   . Acute combined systolic and  diastolic heart failure ([place])    PCP:  Beckie Salts, MD Pharmacy:   [facility] [place] - [address] [place] [address] Phone: [phone] Fax: [phone]     Social Determinants of Health (SDOH) Interventions    Readmission Risk Interventions Readmission Risk Prevention Plan 12/20/2018 12/20/2018  Transportation Screening - Complete  PCP or Specialist Appt within 5-7 Days Complete -  Home Care Screening - Complete  Medication Review (RN CM) - Complete  Some recent data might be hidden

## 2019-06-16 ENCOUNTER — Other Ambulatory Visit: Payer: Self-pay | Admitting: Medical

## 2019-06-28 NOTE — Telephone Encounter (Signed)
Ok to refill one more time. But it looks like she is going to [facility] for cardiology. She needs to get refills from there . Thanks

## 2019-07-17 ENCOUNTER — Other Ambulatory Visit: Payer: Self-pay | Admitting: Medical

## 2019-07-21 ENCOUNTER — Other Ambulatory Visit: Payer: Self-pay | Admitting: Medical

## 2019-08-10 ENCOUNTER — Encounter (HOSPITAL_COMMUNITY): Payer: Self-pay | Admitting: Emergency Medicine

## 2019-08-10 ENCOUNTER — Other Ambulatory Visit: Payer: Self-pay

## 2019-08-10 ENCOUNTER — Emergency Department (HOSPITAL_COMMUNITY): Payer: Medicare HMO

## 2019-08-10 ENCOUNTER — Inpatient Hospital Stay (HOSPITAL_COMMUNITY)
Admission: EM | Admit: 2019-08-10 | Discharge: 2019-08-25 | DRG: 208 | Disposition: A | Discharge status: Skilled Nursing Facility | Payer: Medicare HMO | Attending: Internal Medicine | Admitting: Internal Medicine

## 2019-08-10 DIAGNOSIS — Z4659 Encounter for fitting and adjustment of other gastrointestinal appliance and device: Secondary | ICD-10-CM

## 2019-08-10 DIAGNOSIS — J701 Chronic and other pulmonary manifestations due to radiation: Secondary | ICD-10-CM | POA: Diagnosis present

## 2019-08-10 DIAGNOSIS — I252 Old myocardial infarction: Secondary | ICD-10-CM

## 2019-08-10 DIAGNOSIS — N289 Disorder of kidney and ureter, unspecified: Secondary | ICD-10-CM

## 2019-08-10 DIAGNOSIS — J9621 Acute and chronic respiratory failure with hypoxia: Principal | ICD-10-CM | POA: Diagnosis present

## 2019-08-10 DIAGNOSIS — Z9981 Dependence on supplemental oxygen: Secondary | ICD-10-CM

## 2019-08-10 DIAGNOSIS — E860 Dehydration: Secondary | ICD-10-CM | POA: Diagnosis present

## 2019-08-10 DIAGNOSIS — Z79899 Other long term (current) drug therapy: Secondary | ICD-10-CM

## 2019-08-10 DIAGNOSIS — I2582 Chronic total occlusion of coronary artery: Secondary | ICD-10-CM | POA: Diagnosis present

## 2019-08-10 DIAGNOSIS — Z20822 Contact with and (suspected) exposure to covid-19: Secondary | ICD-10-CM | POA: Diagnosis present

## 2019-08-10 DIAGNOSIS — Z789 Other specified health status: Secondary | ICD-10-CM

## 2019-08-10 DIAGNOSIS — E785 Hyperlipidemia, unspecified: Secondary | ICD-10-CM | POA: Diagnosis present

## 2019-08-10 DIAGNOSIS — Z9289 Personal history of other medical treatment: Secondary | ICD-10-CM

## 2019-08-10 DIAGNOSIS — I509 Heart failure, unspecified: Secondary | ICD-10-CM

## 2019-08-10 DIAGNOSIS — Z885 Allergy status to narcotic agent status: Secondary | ICD-10-CM

## 2019-08-10 DIAGNOSIS — J9600 Acute respiratory failure, unspecified whether with hypoxia or hypercapnia: Secondary | ICD-10-CM

## 2019-08-10 DIAGNOSIS — F419 Anxiety disorder, unspecified: Secondary | ICD-10-CM | POA: Diagnosis present

## 2019-08-10 DIAGNOSIS — R7989 Other specified abnormal findings of blood chemistry: Secondary | ICD-10-CM

## 2019-08-10 DIAGNOSIS — Y842 Radiological procedure and radiotherapy as the cause of abnormal reaction of the patient, or of later complication, without mention of misadventure at the time of the procedure: Secondary | ICD-10-CM | POA: Diagnosis present

## 2019-08-10 DIAGNOSIS — I255 Ischemic cardiomyopathy: Secondary | ICD-10-CM | POA: Diagnosis present

## 2019-08-10 DIAGNOSIS — I110 Hypertensive heart disease with heart failure: Secondary | ICD-10-CM | POA: Diagnosis present

## 2019-08-10 DIAGNOSIS — Z9049 Acquired absence of other specified parts of digestive tract: Secondary | ICD-10-CM

## 2019-08-10 DIAGNOSIS — I5043 Acute on chronic combined systolic (congestive) and diastolic (congestive) heart failure: Secondary | ICD-10-CM | POA: Diagnosis present

## 2019-08-10 DIAGNOSIS — K219 Gastro-esophageal reflux disease without esophagitis: Secondary | ICD-10-CM | POA: Diagnosis present

## 2019-08-10 DIAGNOSIS — R778 Other specified abnormalities of plasma proteins: Secondary | ICD-10-CM

## 2019-08-10 DIAGNOSIS — Z7982 Long term (current) use of aspirin: Secondary | ICD-10-CM

## 2019-08-10 DIAGNOSIS — E1165 Type 2 diabetes mellitus with hyperglycemia: Secondary | ICD-10-CM | POA: Diagnosis not present

## 2019-08-10 DIAGNOSIS — G9340 Encephalopathy, unspecified: Secondary | ICD-10-CM | POA: Diagnosis present

## 2019-08-10 DIAGNOSIS — J189 Pneumonia, unspecified organism: Secondary | ICD-10-CM

## 2019-08-10 DIAGNOSIS — Z85038 Personal history of other malignant neoplasm of large intestine: Secondary | ICD-10-CM

## 2019-08-10 DIAGNOSIS — F17210 Nicotine dependence, cigarettes, uncomplicated: Secondary | ICD-10-CM | POA: Diagnosis present

## 2019-08-10 DIAGNOSIS — Z978 Presence of other specified devices: Secondary | ICD-10-CM

## 2019-08-10 DIAGNOSIS — T380X5A Adverse effect of glucocorticoids and synthetic analogues, initial encounter: Secondary | ICD-10-CM | POA: Diagnosis not present

## 2019-08-10 DIAGNOSIS — Z01818 Encounter for other preprocedural examination: Secondary | ICD-10-CM

## 2019-08-10 DIAGNOSIS — Z8701 Personal history of pneumonia (recurrent): Secondary | ICD-10-CM

## 2019-08-10 DIAGNOSIS — J9690 Respiratory failure, unspecified, unspecified whether with hypoxia or hypercapnia: Secondary | ICD-10-CM | POA: Diagnosis present

## 2019-08-10 DIAGNOSIS — F1120 Opioid dependence, uncomplicated: Secondary | ICD-10-CM | POA: Diagnosis present

## 2019-08-10 DIAGNOSIS — J441 Chronic obstructive pulmonary disease with (acute) exacerbation: Secondary | ICD-10-CM | POA: Diagnosis present

## 2019-08-10 DIAGNOSIS — R0602 Shortness of breath: Secondary | ICD-10-CM

## 2019-08-10 DIAGNOSIS — J40 Bronchitis, not specified as acute or chronic: Secondary | ICD-10-CM | POA: Diagnosis not present

## 2019-08-10 DIAGNOSIS — J398 Other specified diseases of upper respiratory tract: Secondary | ICD-10-CM | POA: Diagnosis present

## 2019-08-10 DIAGNOSIS — Z8249 Family history of ischemic heart disease and other diseases of the circulatory system: Secondary | ICD-10-CM

## 2019-08-10 DIAGNOSIS — E872 Acidosis: Secondary | ICD-10-CM | POA: Diagnosis present

## 2019-08-10 DIAGNOSIS — N179 Acute kidney failure, unspecified: Secondary | ICD-10-CM

## 2019-08-10 DIAGNOSIS — Z955 Presence of coronary angioplasty implant and graft: Secondary | ICD-10-CM

## 2019-08-10 DIAGNOSIS — Z902 Acquired absence of lung [part of]: Secondary | ICD-10-CM

## 2019-08-10 DIAGNOSIS — J9622 Acute and chronic respiratory failure with hypercapnia: Secondary | ICD-10-CM | POA: Diagnosis present

## 2019-08-10 DIAGNOSIS — J440 Chronic obstructive pulmonary disease with acute lower respiratory infection: Secondary | ICD-10-CM | POA: Diagnosis present

## 2019-08-10 DIAGNOSIS — I2510 Atherosclerotic heart disease of native coronary artery without angina pectoris: Secondary | ICD-10-CM | POA: Diagnosis present

## 2019-08-10 DIAGNOSIS — Z85118 Personal history of other malignant neoplasm of bronchus and lung: Secondary | ICD-10-CM

## 2019-08-10 DIAGNOSIS — Z888 Allergy status to other drugs, medicaments and biological substances status: Secondary | ICD-10-CM

## 2019-08-10 DIAGNOSIS — I5042 Chronic combined systolic (congestive) and diastolic (congestive) heart failure: Secondary | ICD-10-CM

## 2019-08-10 DIAGNOSIS — G629 Polyneuropathy, unspecified: Secondary | ICD-10-CM | POA: Diagnosis present

## 2019-08-10 DIAGNOSIS — Z933 Colostomy status: Secondary | ICD-10-CM

## 2019-08-10 LAB — BASIC METABOLIC PANEL
Anion gap: 13 (ref 5–15)
BUN: 20 mg/dL (ref 8–23)
CO2: 23 mmol/L (ref 22–32)
Calcium: 9.3 mg/dL (ref 8.9–10.3)
Chloride: 103 mmol/L (ref 98–111)
Creatinine, Ser: 1.39 mg/dL — ABNORMAL HIGH (ref 0.44–1.00)
GFR calc Af Amer: 47 mL/min — ABNORMAL LOW (ref >60)
GFR calc non Af Amer: 40 mL/min — ABNORMAL LOW (ref >60)
Glucose, Bld: 362 mg/dL — ABNORMAL HIGH (ref 70–99)
Potassium: 4.9 mmol/L (ref 3.5–5.1)
Sodium: 139 mmol/L (ref 135–145)

## 2019-08-10 LAB — CBC WITH DIFFERENTIAL/PLATELET
Abs Immature Granulocytes: 0.08 10*3/uL — ABNORMAL HIGH (ref 0.00–0.07)
Basophils Absolute: 0 10*3/uL (ref 0.0–0.1)
Basophils Relative: 0 %
Eosinophils Absolute: 0.3 10*3/uL (ref 0.0–0.5)
Eosinophils Relative: 2 %
HCT: 44.2 % (ref 36.0–46.0)
Hemoglobin: 12.8 g/dL (ref 12.0–15.0)
Immature Granulocytes: 1 %
Lymphocytes Relative: 33 %
Lymphs Abs: 4.1 10*3/uL — ABNORMAL HIGH (ref 0.7–4.0)
MCH: 26 pg (ref 26.0–34.0)
MCHC: 29 g/dL — ABNORMAL LOW (ref 30.0–36.0)
MCV: 89.7 fL (ref 80.0–100.0)
Monocytes Absolute: 0.5 10*3/uL (ref 0.1–1.0)
Monocytes Relative: 4 %
Neutro Abs: 7.3 10*3/uL (ref 1.7–7.7)
Neutrophils Relative %: 60 %
Platelets: 449 10*3/uL — ABNORMAL HIGH (ref 150–400)
RBC: 4.93 MIL/uL (ref 3.87–5.11)
RDW: 15 % (ref 11.5–15.5)
WBC: 12.2 10*3/uL — ABNORMAL HIGH (ref 4.0–10.5)
nRBC: 0 % (ref 0.0–0.2)

## 2019-08-10 LAB — TROPONIN I (HIGH SENSITIVITY): Troponin I (High Sensitivity): 73 ng/L — ABNORMAL HIGH (ref <18)

## 2019-08-10 LAB — BRAIN NATRIURETIC PEPTIDE: B Natriuretic Peptide: 318.9 pg/mL — ABNORMAL HIGH (ref 0.0–100.0)

## 2019-08-10 MED ORDER — FUROSEMIDE 10 MG/ML IJ SOLN
40.0000 mg | Freq: Once | INTRAMUSCULAR | Status: AC
  Administered 2019-08-10: 40 mg via INTRAVENOUS
  Filled 2019-08-10: qty 4

## 2019-08-10 NOTE — ED Notes (Signed)
Pt resting at this time.  Remains on Bi-Pap and tolerating well.  Warm blankets given to pt.

## 2019-08-10 NOTE — ED Provider Notes (Signed)
[facility] EMERGENCY DEPARTMENT Provider Note   CSN: [id number] Arrival date & time: 08/10/19  2137     History Chief Complaint  Patient presents with  . Respiratory Distress    Chelsea Allison is a 64 y.o. female.  Patient presented with EMS in acute respiratory distress hypoxia on EMS BiPAP.  Patient had MI back in October.  Patient has had worsening CHF since that time.  Patient in severe distress on arrival, unresponsive.  Patient sitting up in tolerating BiPAP.  Patient had relatively rapid improvement on BiPAP.  The history is provided by the patient, the EMS personnel and medical records. The history is limited by the condition of the patient.  Illness Location:  Respiratory Quality:  Shortness of breath Severity:  Severe Onset quality:  Unable to specify Timing:  Constant Progression:  Worsening Chronicity:  Recurrent Context:  Patient has known CHF, however no specific etiology for this acute phase or respiratory failure Relieved by:  BiPAP Worsened by:  Nothing Ineffective treatments:  Nothing Associated symptoms: fatigue and shortness of breath   Associated symptoms: no abdominal pain, no chest pain, no fever, no headaches, no nausea and no vomiting        Past Medical History:  Diagnosis Date  . CAD (coronary artery disease) 10/20 DES to pRCA, DES mCx   . Cancer of lung ([place])    Twice  . Colon cancer ([place])   . COPD (chronic obstructive pulmonary disease) ([place])   . Hyperlipidemia   . Hypertension   . Ischemic cardiomyopathy   . Neuropathy    Secondary to chemo  . STEMI (ST elevation myocardial infarction) ([place]) 12/12/2018  . Tobacco abuse     Patient Active Problem List   Diagnosis Date Noted  . Renal insufficiency   . Chronic combined systolic and diastolic heart failure ([place])   . Respiratory failure with hypoxia and hypercapnia ([place]) 01/08/2019  . Hyperlipidemia 12/20/2018  . Hypertension 12/20/2018  . Pneumonia 12/20/2018  .  Tobacco abuse 12/20/2018  . CAD in native artery 12/20/2018  . Unstable angina ([place])   . Hypoxemia   . Acute respiratory failure with hypoxemia ([place])   . Acute pulmonary edema ([facility])   . COPD with acute exacerbation ([place])   . STEMI (ST elevation myocardial infarction) ([place]) 12/12/2018  . Acute MI, lateral wall ([place]) 12/12/2018  . Acute on chronic respiratory failure with hypercapnia ([place])   . Acute combined systolic and diastolic heart failure [facility])     Past Surgical History:  Procedure Laterality Date  . ABDOMINAL SURGERY     For colon cancer  . COLOSTOMY    . CORONARY STENT INTERVENTION N/A 12/20/2018   Procedure: CORONARY STENT INTERVENTION - Right;  Surgeon: Burnell Blanks, MD;  Location: [place] CV LAB;  Service: Cardiovascular;  Laterality: N/A;  . CORONARY/GRAFT ACUTE MI REVASCULARIZATION N/A 12/12/2018   Procedure: Coronary/Graft Acute MI Revascularization;  Surgeon: Belva Crome, MD;  Location: [place] [facility];  Service: Cardiovascular;  Laterality: N/A;  . LEFT HEART CATH AND CORONARY ANGIOGRAPHY N/A 12/12/2018   Procedure: LEFT HEART CATH AND CORONARY ANGIOGRAPHY;  Surgeon: Belva Crome, MD;  Location: [place] [facility];  Service: Cardiovascular;  Laterality: N/A;     OB History   No obstetric history on file.     Family History  Problem Relation Age of Onset  . Hypertension Mother   . Hypertension Father     Social History   Tobacco Use  .  Smoking status: Current Every Day Smoker    Types: Cigarettes  . Smokeless tobacco: Never Used  Substance Use Topics  . Alcohol use: Never  . Drug use: Never    Home Medications Prior to Admission medications   Medication Sig Start Date End Date Taking? Authorizing Provider  albuterol (VENTOLIN HFA) 108 (90 Base) MCG/ACT inhaler Inhale 2 puffs into the lungs every 4 (four) hours as needed for shortness of breath or wheezing. 12/29/18   [provider]  ALPRAZolam Duanne Moron) 0.5 MG tablet Take  0.5 mg by mouth 2 (two) times daily as needed for anxiety. 10/24/16   [provider]  aspirin 81 MG chewable tablet Chew 1 tablet (81 mg total) by mouth daily. 12/21/18   Furth, Cadence H, PA-C  fluticasone furoate-vilanterol (BREO ELLIPTA) 200-25 MCG/INH AEPB Inhale 1 puff into the lungs daily.    [provider]  furosemide (LASIX) 40 MG tablet Take 1 tablet (40 mg total) by mouth daily. 01/12/19   Shelly Coss, MD  gabapentin (NEURONTIN) 300 MG capsule Take 300 mg by mouth 3 (three) times daily. 04/27/16   [provider]  ipratropium-albuterol (DUONEB) 0.5-2.5 (3) MG/3ML SOLN Inhale 3 mLs into the lungs every 4 (four) hours as needed for shortness of breath or wheezing. 01/15/16   [provider]  losartan (COZAAR) 25 MG tablet Take 0.5 tablets (12.5 mg total) by mouth daily. 12/20/18   Furth, Cadence H, PA-C  metoprolol succinate (TOPROL-XL) 25 MG 24 hr tablet Take 1 tablet (25 mg total) by mouth daily. 12/20/18   Furth, Cadence H, PA-C  nitroGLYCERIN (NITROSTAT) 0.4 MG SL tablet DISSOLVE ONE TABLET UNDER TONGUE EVERY 5 MINUTES AS NEEDED UP TO 3 DOSES. IF NORELIEF CALL 911. 06/28/19   Furth, Cadence H, PA-C  pantoprazole (PROTONIX) 40 MG tablet Take 1 tablet (40 mg total) by mouth daily. 01/12/19   Shelly Coss, MD  potassium chloride SA (KLOR-CON) 20 MEQ tablet Take 1 tablet (20 mEq total) by mouth daily. 01/12/19   Shelly Coss, MD  rosuvastatin (CRESTOR) 10 MG tablet Take 1 tablet (10 mg total) by mouth daily at 6 PM. 12/20/18   Furth, Cadence H, PA-C  ticagrelor (BRILINTA) 90 MG TABS tablet Take 1 tablet (90 mg total) by mouth 2 (two) times daily. 12/20/18   Furth, Cadence H, PA-C  traMADol (ULTRAM) 50 MG tablet Take 100 mg by mouth every 6 (six) hours as needed for moderate pain.    [provider]  Vitamin D, Ergocalciferol, (DRISDOL) 1.25 MG (50000 UT) CAPS capsule Take 50,000 Units by mouth every 7 (seven) days. 07/04/18   [provider]    Allergies    Hydrochlorothiazide, Hydrocodone, Lisinopril, Oxycodone, and Statins  Review of Systems   Review of Systems  Constitutional: Positive for fatigue. Negative for fever.  Respiratory: Positive for shortness of breath.   Cardiovascular: Negative for chest pain.  Gastrointestinal: Negative for abdominal pain, nausea and vomiting.  Neurological: Negative for headaches.  All other systems reviewed and are negative.   Physical Exam Updated Vital Signs BP 107/64   Pulse (!) 110   Temp (!) 96.7 F (35.9 C) (Temporal)   Resp (!) 29   Ht 5\' 9"  (1.753 m)   Wt 83.9 kg   SpO2 99%   BMI 27.32 kg/m   Physical Exam Vitals and nursing note reviewed.  Constitutional:      General: She is in acute distress.     Appearance: She is well-developed. She is  toxic-appearing.  HENT:     Head: Normocephalic and atraumatic.     Right Ear: External ear normal.     Left Ear: External ear normal.     Mouth/Throat:     Mouth: Mucous membranes are dry.  Eyes:     Conjunctiva/sclera: Conjunctivae normal.  Cardiovascular:     Rate and Rhythm: Regular rhythm. Tachycardia present.     Heart sounds: No murmur.  Pulmonary:     Effort: Respiratory distress present.     Breath sounds: Rales present.  Abdominal:     Palpations: Abdomen is soft.     Tenderness: There is no abdominal tenderness.  Musculoskeletal:        General: No swelling or tenderness.     Cervical back: Neck supple.  Skin:    General: Skin is warm and dry.  Neurological:     General: No focal deficit present.     Mental Status: She is alert.     ED Results / Procedures / Treatments   Labs (all labs ordered are listed, but only abnormal results are displayed) Labs Reviewed  BASIC METABOLIC PANEL - Abnormal; Notable for the following components:      Result Value   Glucose, Bld 362 (*)    Creatinine, Ser 1.39 (*)    GFR calc non Af Amer 40 (*)    GFR calc Af Amer 47 (*)    All other  components within normal limits  CBC WITH DIFFERENTIAL/PLATELET - Abnormal; Notable for the following components:   WBC 12.2 (*)    MCHC 29.0 (*)    Platelets 449 (*)    Lymphs Abs 4.1 (*)    Abs Immature Granulocytes 0.08 (*)    All other components within normal limits  BRAIN NATRIURETIC PEPTIDE - Abnormal; Notable for the following components:   B Natriuretic Peptide 318.9 (*)    All other components within normal limits  I-STAT VENOUS BLOOD GAS, ED - Abnormal; Notable for the following components:   pH, Ven 7.146 (*)    pCO2, Ven 87.9 (*)    pO2, Ven 101.0 (*)    Bicarbonate 30.3 (*)    TCO2 33 (*)    All other components within normal limits  I-STAT ARTERIAL BLOOD GAS, ED - Abnormal; Notable for the following components:   pH, Arterial 7.329 (*)    pCO2 arterial 53.2 (*)    pO2, Arterial 117 (*)    Bicarbonate 28.3 (*)    All other components within normal limits  TROPONIN I (HIGH SENSITIVITY) - Abnormal; Notable for the following components:   Troponin I (High Sensitivity) 73 (*)    All other components within normal limits  SARS CORONAVIRUS 2 BY RT PCR (HOSPITAL ORDER, [place] [facility])  BLOOD GAS, VENOUS  BLOOD GAS, ARTERIAL    EKG EKG Interpretation  Date/Time:  Thursday August 10 2019 21:40:30 EDT Ventricular Rate:  113 PR Interval:    QRS Duration: 121 QT Interval:  339 QTC Calculation: 465 R Axis:   51 Text Interpretation: Sinus tachycardia Nonspecific intraventricular conduction delay Nonspecific T abnormalities, lateral leads similar to previous Confirmed by Theotis Burrow [phone]) on 08/10/2019 11:10:01 PM   Radiology DG Chest Port 1 View  Result Date: 08/10/2019 CLINICAL DATA:  Shortness of breath, right upper lobe cancer status post lobectomy with mediastinal recurrence EXAM: PORTABLE CHEST 1 VIEW COMPARISON:  07/06/2019, 05/09/2019 FINDINGS: Single frontal view of the chest demonstrates postsurgical changes from right upper lobectomy.  Increased  soft tissue density with spiculated margins at the right hilum, worrisome for progression of known mediastinal recurrence. Progressive post radiation changes fell also be considered. No new airspace disease, effusion, or pneumothorax. No acute bony abnormalities. IMPRESSION: 1. Increased density at the right hilum, with increasing spiculated margins. Differential includes progressive post radiation change versus Recurrent disease. Electronically Signed   By: Randa Ngo M.D.   On: 08/10/2019 22:07    Procedures Procedures (including critical care time)  Medications Ordered in ED Medications  ticagrelor (BRILINTA) tablet 90 mg (has no administration in time range)  furosemide (LASIX) injection 40 mg (40 mg Intravenous Given 08/10/19 2214)    ED Course  I have reviewed the triage vital signs and the nursing notes.  Pertinent labs & imaging results that were available during my care of the patient were reviewed by me and considered in my medical decision making (see chart for details).    MDM Rules/Calculators/A&P                      Differential diagnosis: Hypoxic respiratory failure, hypercarbic respiratory failure, acute CHF exacerbation, polypharmacy  ED physician interpretation of imaging: Chest x-ray concerning for recurrence of cancer, no significant overt pulmonary edema.  No pneumothorax, widened this time, focal pneumonia. ED physician interpretation of EKG: No STEMI.  ED physician interpretation of labs: Creatinine elevated concerning for AKI, evaded CK.  Patient's VBG with significant hypercarbia however repeat ABG near normal.   MDM: Patient is a 64 year old female presenting to the ED in critical condition with hypoxia and respiratory failure requiring immediate BiPAP.  Intubation was considered however patient responded well to BiPAP.  Ultimately patient's cause of her acute respiratory failure is unknown at this time.  CHF exacerbation unlikely as patient's BNP  is near her baseline, chest x-ray out significant pulmonary edema, blood pressure relatively normal.  Patient has no swelling her legs.  Patient responded well to positive pressure ventilation, with chest x-ray concerning for worsening or recurrence of patient's lung cancer CT a chest was ordered.  Results pending.  Diagnosis, treatment and plan of care was discussed and agreed upon with patient.  Patient comfortable with admission at this time.  Pt care was handed off to oncoming provider at [facility].  Complete history and physical and current plan have been communicated.  Please refer to their note for the remainder of ED care and ultimate disposition.   Final Clinical Impression(s) / ED Diagnoses Final diagnoses:  Acute on chronic respiratory failure with hypoxia [facility])    Rx / DC Orders ED Discharge Orders    None       Delma Post, MD 08/11/19 0308    Rex Kras, Wenda Overland, MD 08/11/19 1240

## 2019-08-10 NOTE — ED Triage Notes (Signed)
Pt to ED via [facility] with SOB    On arrival pt on C-Pap   EMS gave pt NTG SL x's 1 and Mag. 2g IV

## 2019-08-10 NOTE — ED Notes (Signed)
Purewick placed on pt. 

## 2019-08-11 ENCOUNTER — Other Ambulatory Visit: Payer: Self-pay

## 2019-08-11 ENCOUNTER — Emergency Department (HOSPITAL_COMMUNITY): Payer: Medicare HMO

## 2019-08-11 ENCOUNTER — Encounter (HOSPITAL_COMMUNITY): Payer: Self-pay | Admitting: Radiology

## 2019-08-11 ENCOUNTER — Inpatient Hospital Stay (HOSPITAL_COMMUNITY)
Admission: EM | Disposition: A | Discharge status: Skilled Nursing Facility | Payer: Self-pay | Source: Home / Self Care | Attending: Pulmonary Disease

## 2019-08-11 DIAGNOSIS — Z20822 Contact with and (suspected) exposure to covid-19: Secondary | ICD-10-CM | POA: Diagnosis present

## 2019-08-11 DIAGNOSIS — N179 Acute kidney failure, unspecified: Secondary | ICD-10-CM | POA: Diagnosis present

## 2019-08-11 DIAGNOSIS — E872 Acidosis: Secondary | ICD-10-CM | POA: Diagnosis present

## 2019-08-11 DIAGNOSIS — G9340 Encephalopathy, unspecified: Secondary | ICD-10-CM | POA: Diagnosis present

## 2019-08-11 DIAGNOSIS — J9600 Acute respiratory failure, unspecified whether with hypoxia or hypercapnia: Secondary | ICD-10-CM | POA: Diagnosis present

## 2019-08-11 DIAGNOSIS — I5043 Acute on chronic combined systolic (congestive) and diastolic (congestive) heart failure: Secondary | ICD-10-CM | POA: Diagnosis present

## 2019-08-11 DIAGNOSIS — I2510 Atherosclerotic heart disease of native coronary artery without angina pectoris: Secondary | ICD-10-CM | POA: Diagnosis present

## 2019-08-11 DIAGNOSIS — F1120 Opioid dependence, uncomplicated: Secondary | ICD-10-CM | POA: Diagnosis present

## 2019-08-11 DIAGNOSIS — E1165 Type 2 diabetes mellitus with hyperglycemia: Secondary | ICD-10-CM | POA: Diagnosis not present

## 2019-08-11 DIAGNOSIS — J9690 Respiratory failure, unspecified, unspecified whether with hypoxia or hypercapnia: Secondary | ICD-10-CM | POA: Diagnosis present

## 2019-08-11 DIAGNOSIS — E860 Dehydration: Secondary | ICD-10-CM | POA: Diagnosis present

## 2019-08-11 DIAGNOSIS — J9602 Acute respiratory failure with hypercapnia: Secondary | ICD-10-CM | POA: Diagnosis not present

## 2019-08-11 DIAGNOSIS — J440 Chronic obstructive pulmonary disease with acute lower respiratory infection: Secondary | ICD-10-CM | POA: Diagnosis present

## 2019-08-11 DIAGNOSIS — J9621 Acute and chronic respiratory failure with hypoxia: Secondary | ICD-10-CM | POA: Diagnosis present

## 2019-08-11 DIAGNOSIS — F419 Anxiety disorder, unspecified: Secondary | ICD-10-CM | POA: Diagnosis present

## 2019-08-11 DIAGNOSIS — J189 Pneumonia, unspecified organism: Secondary | ICD-10-CM | POA: Diagnosis present

## 2019-08-11 DIAGNOSIS — J441 Chronic obstructive pulmonary disease with (acute) exacerbation: Secondary | ICD-10-CM | POA: Diagnosis present

## 2019-08-11 DIAGNOSIS — I110 Hypertensive heart disease with heart failure: Secondary | ICD-10-CM | POA: Diagnosis present

## 2019-08-11 DIAGNOSIS — R0602 Shortness of breath: Secondary | ICD-10-CM | POA: Diagnosis not present

## 2019-08-11 DIAGNOSIS — I255 Ischemic cardiomyopathy: Secondary | ICD-10-CM | POA: Diagnosis present

## 2019-08-11 DIAGNOSIS — R778 Other specified abnormalities of plasma proteins: Secondary | ICD-10-CM

## 2019-08-11 DIAGNOSIS — J40 Bronchitis, not specified as acute or chronic: Secondary | ICD-10-CM | POA: Diagnosis not present

## 2019-08-11 DIAGNOSIS — K219 Gastro-esophageal reflux disease without esophagitis: Secondary | ICD-10-CM | POA: Diagnosis present

## 2019-08-11 DIAGNOSIS — J9622 Acute and chronic respiratory failure with hypercapnia: Secondary | ICD-10-CM | POA: Diagnosis present

## 2019-08-11 DIAGNOSIS — J701 Chronic and other pulmonary manifestations due to radiation: Secondary | ICD-10-CM | POA: Diagnosis present

## 2019-08-11 DIAGNOSIS — E785 Hyperlipidemia, unspecified: Secondary | ICD-10-CM | POA: Diagnosis present

## 2019-08-11 DIAGNOSIS — J158 Pneumonia due to other specified bacteria: Secondary | ICD-10-CM | POA: Diagnosis not present

## 2019-08-11 DIAGNOSIS — I214 Non-ST elevation (NSTEMI) myocardial infarction: Secondary | ICD-10-CM | POA: Diagnosis not present

## 2019-08-11 DIAGNOSIS — G629 Polyneuropathy, unspecified: Secondary | ICD-10-CM | POA: Diagnosis present

## 2019-08-11 DIAGNOSIS — Z9911 Dependence on respirator [ventilator] status: Secondary | ICD-10-CM | POA: Diagnosis not present

## 2019-08-11 DIAGNOSIS — Y842 Radiological procedure and radiotherapy as the cause of abnormal reaction of the patient, or of later complication, without mention of misadventure at the time of the procedure: Secondary | ICD-10-CM | POA: Diagnosis present

## 2019-08-11 DIAGNOSIS — I2582 Chronic total occlusion of coronary artery: Secondary | ICD-10-CM | POA: Diagnosis present

## 2019-08-11 DIAGNOSIS — J9601 Acute respiratory failure with hypoxia: Secondary | ICD-10-CM | POA: Diagnosis not present

## 2019-08-11 DIAGNOSIS — I509 Heart failure, unspecified: Secondary | ICD-10-CM

## 2019-08-11 DIAGNOSIS — J398 Other specified diseases of upper respiratory tract: Secondary | ICD-10-CM | POA: Diagnosis present

## 2019-08-11 HISTORY — PX: LEFT HEART CATH AND CORONARY ANGIOGRAPHY: CATH118249

## 2019-08-11 LAB — TROPONIN I (HIGH SENSITIVITY): Troponin I (High Sensitivity): 11509 ng/L (ref <18)

## 2019-08-11 LAB — I-STAT ARTERIAL BLOOD GAS, ED
Acid-Base Excess: 1 mmol/L (ref 0.0–2.0)
Bicarbonate: 28.3 mmol/L — ABNORMAL HIGH (ref 20.0–28.0)
Calcium, Ion: 1.37 mmol/L (ref 1.15–1.40)
HCT: 40 % (ref 36.0–46.0)
Hemoglobin: 13.6 g/dL (ref 12.0–15.0)
O2 Saturation: 98 %
Patient temperature: 96.8
Potassium: 3.9 mmol/L (ref 3.5–5.1)
Sodium: 145 mmol/L (ref 135–145)
TCO2: 30 mmol/L (ref 22–32)
pCO2 arterial: 53.2 mmHg — ABNORMAL HIGH (ref 32.0–48.0)
pH, Arterial: 7.329 — ABNORMAL LOW (ref 7.350–7.450)
pO2, Arterial: 117 mmHg — ABNORMAL HIGH (ref 83.0–108.0)

## 2019-08-11 LAB — GLUCOSE, CAPILLARY
Glucose-Capillary: 84 mg/dL (ref 70–99)
Glucose-Capillary: 165 mg/dL — ABNORMAL HIGH (ref 70–99)
Glucose-Capillary: 170 mg/dL — ABNORMAL HIGH (ref 70–99)

## 2019-08-11 LAB — I-STAT VENOUS BLOOD GAS, ED
Acid-base deficit: 1 mmol/L (ref 0.0–2.0)
Bicarbonate: 30.3 mmol/L — ABNORMAL HIGH (ref 20.0–28.0)
Calcium, Ion: 1.37 mmol/L (ref 1.15–1.40)
HCT: 42 % (ref 36.0–46.0)
Hemoglobin: 14.3 g/dL (ref 12.0–15.0)
O2 Saturation: 95 %
Potassium: 3.9 mmol/L (ref 3.5–5.1)
Sodium: 145 mmol/L (ref 135–145)
TCO2: 33 mmol/L — ABNORMAL HIGH (ref 22–32)
pCO2, Ven: 87.9 mmHg (ref 44.0–60.0)
pH, Ven: 7.146 — CL (ref 7.250–7.430)
pO2, Ven: 101 mmHg — ABNORMAL HIGH (ref 32.0–45.0)

## 2019-08-11 LAB — BASIC METABOLIC PANEL
Anion gap: 10 (ref 5–15)
BUN: 20 mg/dL (ref 8–23)
CO2: 24 mmol/L (ref 22–32)
Calcium: 9.2 mg/dL (ref 8.9–10.3)
Chloride: 107 mmol/L (ref 98–111)
Creatinine, Ser: 1.1 mg/dL — ABNORMAL HIGH (ref 0.44–1.00)
GFR calc Af Amer: >60 mL/min (ref >60)
GFR calc non Af Amer: 53 mL/min — ABNORMAL LOW (ref >60)
Glucose, Bld: 87 mg/dL (ref 70–99)
Potassium: 4 mmol/L (ref 3.5–5.1)
Sodium: 141 mmol/L (ref 135–145)

## 2019-08-11 LAB — PROCALCITONIN: Procalcitonin: 1.77 ng/mL

## 2019-08-11 LAB — HEMOGLOBIN A1C
Hgb A1c MFr Bld: 5.8 % — ABNORMAL HIGH (ref 4.8–5.6)
Mean Plasma Glucose: 119.76 mg/dL

## 2019-08-11 LAB — SARS CORONAVIRUS 2 BY RT PCR (HOSPITAL ORDER, PERFORMED IN ~~LOC~~ HOSPITAL LAB): SARS Coronavirus 2: NEGATIVE

## 2019-08-11 SURGERY — LEFT HEART CATH AND CORONARY ANGIOGRAPHY
Anesthesia: LOCAL

## 2019-08-11 MED ORDER — METHYLPREDNISOLONE SODIUM SUCC 125 MG IJ SOLR
60.0000 mg | Freq: Four times a day (QID) | INTRAMUSCULAR | Status: DC
  Administered 2019-08-11 – 2019-08-13 (×8): 60 mg via INTRAVENOUS
  Filled 2019-08-11 (×9): qty 2

## 2019-08-11 MED ORDER — SODIUM CHLORIDE 0.9% FLUSH: 3.0000 mL | INTRAVENOUS | Status: DC | PRN

## 2019-08-11 MED ORDER — SODIUM CHLORIDE 0.9 % IV SOLN: 250.0000 mL | INTRAVENOUS | Status: DC | PRN

## 2019-08-11 MED ORDER — ALPRAZOLAM 0.5 MG PO TABS
0.5000 mg | ORAL_TABLET | Freq: Two times a day (BID) | ORAL | Status: DC | PRN
  Administered 2019-08-11 – 2019-08-12 (×2): 0.5 mg via ORAL
  Filled 2019-08-11 (×2): qty 1

## 2019-08-11 MED ORDER — SODIUM CHLORIDE 0.9 % IV SOLN: INTRAVENOUS | Status: AC

## 2019-08-11 MED ORDER — LABETALOL HCL 5 MG/ML IV SOLN: 10.0000 mg | INTRAVENOUS | Status: AC | PRN

## 2019-08-11 MED ORDER — SODIUM CHLORIDE 0.9% FLUSH
3.0000 mL | Freq: Two times a day (BID) | INTRAVENOUS | Status: DC
  Administered 2019-08-11 – 2019-08-12 (×3): 3 mL via INTRAVENOUS

## 2019-08-11 MED ORDER — IPRATROPIUM-ALBUTEROL 0.5-2.5 (3) MG/3ML IN SOLN
3.0000 mL | Freq: Four times a day (QID) | RESPIRATORY_TRACT | Status: DC
  Administered 2019-08-11: 3 mL via RESPIRATORY_TRACT
  Filled 2019-08-11 (×2): qty 3

## 2019-08-11 MED ORDER — SODIUM CHLORIDE 0.9% FLUSH
3.0000 mL | Freq: Two times a day (BID) | INTRAVENOUS | Status: DC
  Administered 2019-08-12 (×2): 3 mL via INTRAVENOUS

## 2019-08-11 MED ORDER — ASPIRIN 81 MG PO CHEW
81.0000 mg | CHEWABLE_TABLET | ORAL | Status: AC
  Administered 2019-08-11: 81 mg via ORAL

## 2019-08-11 MED ORDER — ONDANSETRON HCL 4 MG/2ML IJ SOLN: 4.0000 mg | Freq: Four times a day (QID) | INTRAMUSCULAR | Status: DC | PRN

## 2019-08-11 MED ORDER — MIDAZOLAM HCL 2 MG/2ML IJ SOLN
INTRAMUSCULAR | Status: AC
  Filled 2019-08-11: qty 2

## 2019-08-11 MED ORDER — HEPARIN (PORCINE) IN NACL 1000-0.9 UT/500ML-% IV SOLN
INTRAVENOUS | Status: AC
  Filled 2019-08-11: qty 1000

## 2019-08-11 MED ORDER — HEPARIN SODIUM (PORCINE) 1000 UNIT/ML IJ SOLN
INTRAMUSCULAR | Status: AC
  Filled 2019-08-11: qty 1

## 2019-08-11 MED ORDER — ACETAMINOPHEN 325 MG PO TABS
650.0000 mg | ORAL_TABLET | ORAL | Status: DC | PRN
  Administered 2019-08-11: 650 mg via ORAL
  Filled 2019-08-11: qty 2

## 2019-08-11 MED ORDER — VERAPAMIL HCL 2.5 MG/ML IV SOLN
INTRAVENOUS | Status: AC
  Filled 2019-08-11: qty 2

## 2019-08-11 MED ORDER — INSULIN ASPART 100 UNIT/ML ~~LOC~~ SOLN: 0.0000 [IU] | Freq: Every day | SUBCUTANEOUS | Status: DC

## 2019-08-11 MED ORDER — FENTANYL CITRATE (PF) 100 MCG/2ML IJ SOLN
INTRAMUSCULAR | Status: AC
  Filled 2019-08-11: qty 2

## 2019-08-11 MED ORDER — FENTANYL CITRATE (PF) 100 MCG/2ML IJ SOLN
INTRAMUSCULAR | Status: DC | PRN
  Administered 2019-08-11 (×2): 25 ug via INTRAVENOUS

## 2019-08-11 MED ORDER — MIDAZOLAM HCL 2 MG/2ML IJ SOLN
INTRAMUSCULAR | Status: DC | PRN
  Administered 2019-08-11 (×2): 1 mg via INTRAVENOUS

## 2019-08-11 MED ORDER — HEPARIN (PORCINE) IN NACL 1000-0.9 UT/500ML-% IV SOLN
INTRAVENOUS | Status: DC | PRN
  Administered 2019-08-11 (×2): 500 mL

## 2019-08-11 MED ORDER — METOPROLOL SUCCINATE ER 25 MG PO TB24: 25.0000 mg | ORAL_TABLET | Freq: Every day | ORAL | Status: DC

## 2019-08-11 MED ORDER — ENOXAPARIN SODIUM 40 MG/0.4ML ~~LOC~~ SOLN
40.0000 mg | SUBCUTANEOUS | Status: DC
  Filled 2019-08-11: qty 0.4

## 2019-08-11 MED ORDER — IOHEXOL 350 MG/ML SOLN
INTRAVENOUS | Status: DC | PRN
  Administered 2019-08-11: 50 mL

## 2019-08-11 MED ORDER — ASPIRIN 81 MG PO CHEW
81.0000 mg | CHEWABLE_TABLET | Freq: Every day | ORAL | Status: DC
  Administered 2019-08-12 – 2019-08-13 (×2): 81 mg via ORAL
  Filled 2019-08-11 (×3): qty 1

## 2019-08-11 MED ORDER — TICAGRELOR 90 MG PO TABS: 90.0000 mg | ORAL_TABLET | Freq: Two times a day (BID) | ORAL | Status: DC

## 2019-08-11 MED ORDER — SODIUM CHLORIDE 0.9 % IV SOLN: INTRAVENOUS | Status: DC

## 2019-08-11 MED ORDER — HEPARIN SODIUM (PORCINE) 1000 UNIT/ML IJ SOLN
INTRAMUSCULAR | Status: DC | PRN
  Administered 2019-08-11: 4000 [IU] via INTRAVENOUS

## 2019-08-11 MED ORDER — INSULIN ASPART 100 UNIT/ML ~~LOC~~ SOLN
0.0000 [IU] | Freq: Three times a day (TID) | SUBCUTANEOUS | Status: DC
  Administered 2019-08-11: 2 [IU] via SUBCUTANEOUS

## 2019-08-11 MED ORDER — SODIUM CHLORIDE 0.9 % IV SOLN
1.0000 g | INTRAVENOUS | Status: DC
  Administered 2019-08-12: 1 g via INTRAVENOUS
  Filled 2019-08-11 (×2): qty 10

## 2019-08-11 MED ORDER — IOHEXOL 350 MG/ML SOLN
75.0000 mL | Freq: Once | INTRAVENOUS | Status: AC | PRN
  Administered 2019-08-11: 75 mL via INTRAVENOUS

## 2019-08-11 MED ORDER — LIDOCAINE HCL (PF) 1 % IJ SOLN
INTRAMUSCULAR | Status: DC | PRN
  Administered 2019-08-11: 2 mL

## 2019-08-11 MED ORDER — SODIUM CHLORIDE 0.9 % IV SOLN
500.0000 mg | INTRAVENOUS | Status: AC
  Administered 2019-08-11 – 2019-08-15 (×5): 500 mg via INTRAVENOUS
  Filled 2019-08-11 (×7): qty 500

## 2019-08-11 MED ORDER — ALBUTEROL SULFATE (2.5 MG/3ML) 0.083% IN NEBU
2.5000 mg | INHALATION_SOLUTION | Freq: Four times a day (QID) | RESPIRATORY_TRACT | Status: DC | PRN
  Administered 2019-08-12 (×2): 2.5 mg via RESPIRATORY_TRACT
  Filled 2019-08-11 (×2): qty 3

## 2019-08-11 MED ORDER — HEPARIN (PORCINE) 25000 UT/250ML-% IV SOLN
1050.0000 [IU]/h | INTRAVENOUS | Status: DC
  Administered 2019-08-11: 1050 [IU]/h via INTRAVENOUS
  Filled 2019-08-11: qty 250

## 2019-08-11 MED ORDER — LIDOCAINE HCL (PF) 1 % IJ SOLN
INTRAMUSCULAR | Status: AC
  Filled 2019-08-11: qty 30

## 2019-08-11 MED ORDER — TICAGRELOR 90 MG PO TABS
90.0000 mg | ORAL_TABLET | Freq: Two times a day (BID) | ORAL | Status: DC
  Administered 2019-08-11 – 2019-08-13 (×6): 90 mg via ORAL
  Filled 2019-08-11 (×7): qty 1

## 2019-08-11 MED ORDER — HEPARIN BOLUS VIA INFUSION
4000.0000 [IU] | Freq: Once | INTRAVENOUS | Status: AC
  Administered 2019-08-11: 4000 [IU] via INTRAVENOUS
  Filled 2019-08-11: qty 4000

## 2019-08-11 MED ORDER — HYDRALAZINE HCL 20 MG/ML IJ SOLN: 10.0000 mg | INTRAMUSCULAR | Status: AC | PRN

## 2019-08-11 MED ORDER — GUAIFENESIN ER 600 MG PO TB12
600.0000 mg | ORAL_TABLET | Freq: Two times a day (BID) | ORAL | Status: DC
  Administered 2019-08-12 – 2019-08-13 (×2): 600 mg via ORAL
  Filled 2019-08-11 (×4): qty 1

## 2019-08-11 MED ORDER — ACETAMINOPHEN 325 MG PO TABS
650.0000 mg | ORAL_TABLET | ORAL | Status: DC | PRN
  Administered 2019-08-11 – 2019-08-14 (×4): 650 mg via ORAL
  Filled 2019-08-11 (×4): qty 2

## 2019-08-11 MED ORDER — ROSUVASTATIN CALCIUM 20 MG PO TABS
20.0000 mg | ORAL_TABLET | Freq: Every day | ORAL | Status: DC
  Administered 2019-08-12 – 2019-08-13 (×2): 20 mg via ORAL
  Filled 2019-08-11 (×3): qty 1

## 2019-08-11 SURGICAL SUPPLY — 9 items

## 2019-08-11 NOTE — H&P (Addendum)
History and Physical    [name] HQP:[id number] DOB: 07/11/1955 DOA: 08/10/2019  PCP: Beckie Salts, MD Patient coming from: Home  Chief Complaint: Shortness of breath  HPI: Chelsea Allison is a 64 y.o. female with medical history significant of CAD/ STEMI in October 2020 status post PCI, COPD, hypertension, hyperlipidemia, CHF, non-small cell lung cancer stage III status post partial lung resection, anal cancer status post colostomy presented to the ED via EMS for evaluation of respiratory distress.  Hypoxic and placed on CPAP by EMS.  Also given sublingual nitroglycerin x1 and IV magnesium 2 g.  Patient reports having intermittent spells of shortness of breath since her MI last year.  States last night she again experienced short of breath and started wheezing.  She could barely breathe and had to call EMS.  Denies any associated chest pain or cough.  Denies fevers or chills.  Reports using Breo regularly for her COPD and albuterol as needed.  ED Course: Upon arrival to the ED, patient was unresponsive and satting 50% on CPAP.  She was placed on BiPAP and became more responsive after 30 minutes.   Afebrile.  Tachycardic and significantly tachypneic on arrival.  WBC count 12.2.  Blood glucose 362.  BUN 20, creatinine 1.3.  Baseline creatinine 0.8.  BNP 318.  High-sensitivity troponin 73.  EKG without acute ischemic changes.  SARS-CoV-2 PCR test negative.  Chest x-ray personally reviewed showing increased density at the right hilum.  CT angiogram negative for PE.  Showing mild groundglass tree-in-bud opacities throughout both lungs which may be due to infectious or inflammatory process.  Stable ill-defined pulmonary nodules in the posterior left lower lobe and right middle lobe.   ABG done a few hours later with pH 7.32, PCO2 53, and PO2 117.  Patient was given IV Lasix 40 mg.  Review of Systems:  All systems reviewed and apart from history of presenting illness, are negative.  Past Medical  History:  Diagnosis Date  . CAD (coronary artery disease) 10/20 DES to pRCA, DES mCx   . Cancer of lung ([place])    Twice  . Colon cancer ([place])   . COPD (chronic obstructive pulmonary disease) ([place])   . Hyperlipidemia   . Hypertension   . Ischemic cardiomyopathy   . Neuropathy    Secondary to chemo  . STEMI (ST elevation myocardial infarction) ([place]) 12/12/2018  . Tobacco abuse     Past Surgical History:  Procedure Laterality Date  . ABDOMINAL SURGERY     For colon cancer  . COLOSTOMY    . CORONARY STENT INTERVENTION N/A 12/20/2018   Procedure: CORONARY STENT INTERVENTION - Right;  Surgeon: Burnell Blanks, MD;  Location: [place] [facility];  Service: Cardiovascular;  Laterality: N/A;  . CORONARY/GRAFT ACUTE MI REVASCULARIZATION N/A 12/12/2018   Procedure: Coronary/Graft Acute MI Revascularization;  Surgeon: Belva Crome, MD;  Location: [facility];  Service: Cardiovascular;  Laterality: N/A;  . LEFT HEART CATH AND CORONARY ANGIOGRAPHY N/A 12/12/2018   Procedure: LEFT HEART CATH AND CORONARY ANGIOGRAPHY;  Surgeon: Belva Crome, MD;  Location: [place];  Service: Cardiovascular;  Laterality: N/A;     reports that she has been smoking cigarettes. She has never used smokeless tobacco. She reports that she does not drink alcohol or use drugs.  Allergies  Allergen Reactions  . Hydrochlorothiazide Other (See Comments)    headaches  . Hydrocodone Nausea And Vomiting  . Lisinopril Other (See Comments)    Headache   .  Oxycodone Nausea Only  . Statins Other (See Comments)    Family History  Problem Relation Age of Onset  . Hypertension Mother   . Hypertension Father     Prior to Admission medications   Medication Sig Start Date End Date Taking? Authorizing Provider  albuterol (VENTOLIN HFA) 108 (90 Base) MCG/ACT inhaler Inhale 2 puffs into the lungs every 4 (four) hours as needed for shortness of breath or wheezing. 12/29/18   [provider]    ALPRAZolam Duanne Moron) 0.5 MG tablet Take 0.5 mg by mouth 2 (two) times daily as needed for anxiety. 10/24/16   [provider]  aspirin 81 MG chewable tablet Chew 1 tablet (81 mg total) by mouth daily. 12/21/18   Furth, Cadence H, PA-C  fluticasone furoate-vilanterol (BREO ELLIPTA) 200-25 MCG/INH AEPB Inhale 1 puff into the lungs daily.    [provider]  furosemide (LASIX) 40 MG tablet Take 1 tablet (40 mg total) by mouth daily. 01/12/19   Shelly Coss, MD  gabapentin (NEURONTIN) 300 MG capsule Take 300 mg by mouth 3 (three) times daily. 04/27/16   [provider]  ipratropium-albuterol (DUONEB) 0.5-2.5 (3) MG/3ML SOLN Inhale 3 mLs into the lungs every 4 (four) hours as needed for shortness of breath or wheezing. 01/15/16   [provider]  losartan (COZAAR) 25 MG tablet Take 0.5 tablets (12.5 mg total) by mouth daily. 12/20/18   Furth, Cadence H, PA-C  metoprolol succinate (TOPROL-XL) 25 MG 24 hr tablet Take 1 tablet (25 mg total) by mouth daily. 12/20/18   Furth, Cadence H, PA-C  nitroGLYCERIN (NITROSTAT) 0.4 MG SL tablet DISSOLVE ONE TABLET UNDER TONGUE EVERY 5 MINUTES AS NEEDED UP TO 3 DOSES. IF NORELIEF CALL 911. 06/28/19   Furth, Cadence H, PA-C  pantoprazole (PROTONIX) 40 MG tablet Take 1 tablet (40 mg total) by mouth daily. 01/12/19   Shelly Coss, MD  potassium chloride SA (KLOR-CON) 20 MEQ tablet Take 1 tablet (20 mEq total) by mouth daily. 01/12/19   Shelly Coss, MD  rosuvastatin (CRESTOR) 10 MG tablet Take 1 tablet (10 mg total) by mouth daily at 6 PM. 12/20/18   Furth, Cadence H, PA-C  ticagrelor (BRILINTA) 90 MG TABS tablet Take 1 tablet (90 mg total) by mouth 2 (two) times daily. 12/20/18   Furth, Cadence H, PA-C  traMADol (ULTRAM) 50 MG tablet Take 100 mg by mouth every 6 (six) hours as needed for moderate pain.    [provider]  Vitamin D, Ergocalciferol, (DRISDOL) 1.25 MG (50000 UT) CAPS capsule Take 50,000 Units by mouth every 7  (seven) days. 07/04/18   [provider]    Physical Exam: Vitals:   08/11/19 0145 08/11/19 0330 08/11/19 0456 08/11/19 0500  BP: (!) 107/97  107/68 (!) 107/58  Pulse: 84 83 83 88  Resp: (!) 31 20 (!) 22 (!) 22  Temp:      TempSrc:      SpO2: 98% 99% 99% 100%  Weight:      Height:        Physical Exam  Constitutional: She is oriented to person, place, and time. She appears well-developed and well-nourished. No distress.  HENT:  Head: Normocephalic.  Eyes: Right eye exhibits no discharge. Left eye exhibits no discharge.  Neck: JVD present.  Cardiovascular: Normal rate, regular rhythm and intact distal pulses.  Pulmonary/Chest:  Slightly tachypneic with respiratory rate in the low 20s Speaking clearly in full sentences No accessory muscle use Rhonchi Satting 100% on 6 L  supplemental oxygen via nasal cannula  Abdominal: Soft. Bowel sounds are normal. She exhibits no distension. There is no abdominal tenderness. There is no guarding.  Musculoskeletal:        General: No edema.     Cervical back: Neck supple.  Neurological: She is alert and oriented to person, place, and time.  Skin: Skin is warm and dry. She is not diaphoretic.    Labs on Admission: I have personally reviewed following labs and imaging studies  CBC: Recent Labs  Lab 08/10/19 2150 08/11/19 0103 08/11/19 0139  WBC 12.2*  --   --   NEUTROABS 7.3  --   --   HGB 12.8 14.3 13.6  HCT 44.2 42.0 40.0  MCV 89.7  --   --   PLT 449*  --   --    Basic Metabolic Panel: Recent Labs  Lab 08/10/19 2150 08/11/19 0103 08/11/19 0139  NA 139 145 145  K 4.9 3.9 3.9  CL 103  --   --   CO2 23  --   --   GLUCOSE 362*  --   --   BUN 20  --   --   CREATININE 1.39*  --   --   CALCIUM 9.3  --   --    GFR: Estimated Creatinine Clearance: 47.9 mL/min (A) (by C-G formula based on SCr of 1.39 mg/dL (H)). Liver Function Tests: No results for input(s): AST, ALT, ALKPHOS, BILITOT, PROT, ALBUMIN in the last 168  hours. No results for input(s): LIPASE, AMYLASE in the last 168 hours. No results for input(s): AMMONIA in the last 168 hours. Coagulation Profile: No results for input(s): INR, PROTIME in the last 168 hours. Cardiac Enzymes: No results for input(s): CKTOTAL, CKMB, CKMBINDEX, TROPONINI in the last 168 hours. BNP (last 3 results) No results for input(s): PROBNP in the last 8760 hours. HbA1C: No results for input(s): HGBA1C in the last 72 hours. CBG: No results for input(s): GLUCAP in the last 168 hours. Lipid Profile: No results for input(s): CHOL, HDL, LDLCALC, TRIG, CHOLHDL, LDLDIRECT in the last 72 hours. Thyroid Function Tests: No results for input(s): TSH, T4TOTAL, FREET4, T3FREE, THYROIDAB in the last 72 hours. Anemia Panel: No results for input(s): VITAMINB12, FOLATE, FERRITIN, TIBC, IRON, RETICCTPCT in the last 72 hours. Urine analysis: No results found for: COLORURINE, APPEARANCEUR, LABSPEC, PHURINE, GLUCOSEU, HGBUR, BILIRUBINUR, KETONESUR, PROTEINUR, UROBILINOGEN, NITRITE, LEUKOCYTESUR  Radiological Exams on Admission: CT Angio Chest PE W/Cm &/Or Wo Cm  Result Date: 08/11/2019 CLINICAL DATA:  Shortness of breath EXAM: CT ANGIOGRAPHY CHEST WITH CONTRAST TECHNIQUE: Multidetector CT imaging of the chest was performed using the standard protocol during bolus administration of intravenous contrast. Multiplanar CT image reconstructions and MIPs were obtained to evaluate the vascular anatomy. CONTRAST:  57mL OMNIPAQUE IOHEXOL 350 MG/ML SOLN COMPARISON:  Radiograph same day, CT May 09, 2019 FINDINGS: Cardiovascular: There is a optimal opacification of the pulmonary arteries. There is no central,segmental, or subsegmental filling defects within the pulmonary arteries. The heart is normal in size. No pericardial effusion or thickening. No evidence right heart strain. There is normal three-vessel brachiocephalic anatomy without proximal stenosis. Aortic atherosclerosis is noted. There is  calcifications seen at the mitral valve and coronary artery calcifications. Mediastinum/Nodes: No hilar, mediastinal, or axillary adenopathy. Thyroid gland, trachea, and esophagus demonstrate no significant findings. Lungs/Pleura: Again noted is right medial suprahilar scarring and bronchiectasis with surgical sutures. There is no significant change in the ill-defined nodular opacity in the posterior left lower lobe  measuring 1 cm, series 6, image 61. There is also a small 8 mm ill-defined opacity seen within the right middle lobe. There is mild hazy ill-defined ground-glass opacity with tree-in-bud opacities seen throughout both lungs. No pleural effusion. Upper Abdomen: No acute abnormalities present in the visualized portions of the upper abdomen. Musculoskeletal: No chest wall abnormality. No acute or significant osseous findings. Review of the MIP images confirms the above findings. IMPRESSION: No central, segmental, or subsegmental pulmonary embolism. Mild ground-glass tree-in-bud opacities seen throughout both lungs which may be due to infectious or inflammatory process. Stable ill-defined pulmonary nodules in the posterior left lower lobe and right middle lobe as described above. Aortic Atherosclerosis (ICD10-I70.0). Electronically Signed   By: Prudencio Pair M.D.   On: 08/11/2019 04:10   DG Chest Port 1 View  Result Date: 08/10/2019 CLINICAL DATA:  Shortness of breath, right upper lobe cancer status post lobectomy with mediastinal recurrence EXAM: PORTABLE CHEST 1 VIEW COMPARISON:  07/06/2019, 05/09/2019 FINDINGS: Single frontal view of the chest demonstrates postsurgical changes from right upper lobectomy. Increased soft tissue density with spiculated margins at the right hilum, worrisome for progression of known mediastinal recurrence. Progressive post radiation changes fell also be considered. No new airspace disease, effusion, or pneumothorax. No acute bony abnormalities. IMPRESSION: 1. Increased  density at the right hilum, with increasing spiculated margins. Differential includes progressive post radiation change versus Recurrent disease. Electronically Signed   By: Randa Ngo M.D.   On: 08/10/2019 22:07    EKG: Personally reviewed.  Sinus tachycardia, heart rate 113.  No significant change from prior tracing.  Assessment/Plan Principal Problem:   Acute respiratory failure (HCC) Active Problems:   COPD with acute exacerbation (HCC)   CAP (community acquired pneumonia)   CHF exacerbation ([place])   AKI (acute kidney injury) ([place])   Acute hypoxic hypercarbic respiratory failure secondary to acute COPD exacerbation, mild CHF exacerbation, and possible CAP: Patient was unresponsive on arrival to the ED with oxygen saturation 50% on CPAP.  She was on BiPAP for several hours and improved.  Currently awake and alert.  Work of breathing significantly improved.  Satting 100% on 6 L supplemental oxygen via nasal cannula.  Does have rhonchi on exam.  Also has signs of mild volume overload with JVD but no lower extremity edema.  BNP slightly elevated.  Last echo done in November 2020 with LVEF 45 to 50%.  CT angiogram negative for PE.  Showing mild groundglass tree-in-bud opacities throughout both lungs which may be due to infectious or inflammatory process.  Afebrile, no leukocytosis, no signs of sepsis at this time.  SARS-CoV-2 PCR test negative. -Solu-Medrol 60 mg every 6 hours, DuoNebs every 6 hours, albuterol inhaler as needed, Mucinex, flutter valve -Patient received IV Lasix 40 mg in the ED.  Reassess renal function in a.m. and order additional doses as needed.  Monitor intake and output, daily weights, and low-sodium diet with fluid restriction. -Start ceftriaxone and azithromycin.  Check procalcitonin level and RVP.  Order blood cultures.  Continue to monitor WBC count. -Supplemental oxygen, wean as tolerated  CAD/ STEMI in October 2020 status post PCI: Patient denies chest pain.   High-sensitivity troponin mildly elevated at 73.  EKG without acute ischemic changes.  Suspect mild troponin elevation is due to demand ischemia. -Cardiac monitoring, trend troponin.  Continue home aspirin, Brilinta, beta-blocker, and statin.  Mild AKI: BUN 20, creatinine 1.3.  Baseline creatinine 0.8. -Received Lasix for signs of volume overload/respiratory distress.  Hold home losartan.  Reassess renal function in a.m.  Hypertension: Currently normotensive. -Continue home metoprolol.  Hold losartan given mild AKI.  Hyperlipidemia -Continue home statin  Addendum: Hyperglycemia in setting of history of prediabetes Blood glucose 362.  Last A1c 5.7 in November 2020. -Recheck A1c and order sliding scale insulin sensitive ACHS.  DVT prophylaxis: Lovenox Code Status: Full code Family Communication: No family available at this time. Disposition Plan: Status is: Inpatient  Remains inpatient appropriate because:Inpatient level of care appropriate due to severity of illness   Dispo: The patient is from: Home              Anticipated d/c is to: Home              Anticipated d/c date is: 3 days              Patient currently is not medically stable to d/c.  The medical decision making on this patient was of high complexity and the patient is at high risk for clinical deterioration, therefore this is a level 3 visit.  Shela Leff MD [facility]  If 7PM-7AM, please contact night-coverage [email or web address]  08/11/2019, 5:38 AM

## 2019-08-11 NOTE — Interval H&P Note (Signed)
History and Physical Interval Note:  08/11/2019 1:40 PM  Chelsea Allison  has presented today for surgery, with the diagnosis of nonstemi.  The various methods of treatment have been discussed with the patient and family. After consideration of risks, benefits and other options for treatment, the patient has consented to  Procedure(s): LEFT HEART CATH AND CORONARY ANGIOGRAPHY (N/A) as a surgical intervention.  The patient's history has been reviewed, patient examined, no change in status, stable for surgery.  I have reviewed the patient's chart and labs.  Questions were answered to the patient's satisfaction.    Cath Lab Visit (complete for each Cath Lab visit)  Clinical Evaluation Leading to the Procedure:   ACS: Yes.    Non-ACS:    Anginal Classification: CCS III  Anti-ischemic medical therapy: Minimal Therapy (1 class of medications)  Non-Invasive Test Results: No non-invasive testing performed  Prior CABG: No previous CABG        Chelsea Allison

## 2019-08-11 NOTE — ED Notes (Signed)
Pt had a full colostomy bag on arrival, pt cleaned and a colostomy bag changed for a new clean one.

## 2019-08-11 NOTE — Progress Notes (Signed)
ANTICOAGULATION CONSULT NOTE - Initial Consult  Pharmacy Consult for heparin Indication: chest pain/ACS  Allergies  Allergen Reactions  . Hydrochlorothiazide Other (See Comments)    headaches  . Hydrocodone Nausea And Vomiting  . Lisinopril Other (See Comments)    Headache   . Oxycodone Nausea Only  . Statins Other (See Comments)    Patient Measurements: Height: 5\' 9"  (175.3 cm) Weight: 83.9 kg (185 lb) IBW/kg (Calculated) : 66.2 Heparin Dosing Weight: 83 kg   Vital Signs: Temp: 97 F (36.1 C) (06/04 0615) Temp Source: Oral (06/04 0615) BP: 98/41 (06/04 0615) Pulse Rate: 79 (06/04 0815)  Labs: Recent Labs    08/10/19 2150 08/10/19 2150 08/11/19 0103 08/11/19 0139 08/11/19 0619  HGB 12.8   < > 14.3 13.6  --   HCT 44.2  --  42.0 40.0  --   PLT 449*  --   --   --   --   CREATININE 1.39*  --   --   --   --   TROPONINIHS 73*  --   --   --  11,509*   < > = values in this interval not displayed.    Estimated Creatinine Clearance: 47.9 mL/min (A) (by C-G formula based on SCr of 1.39 mg/dL (H)).   Medical History: Past Medical History:  Diagnosis Date  . CAD (coronary artery disease) 10/20 DES to pRCA, DES mCx   . Cancer of lung ([place])    Twice  . Colon cancer ([place])   . COPD (chronic obstructive pulmonary disease) ([place])   . Hyperlipidemia   . Hypertension   . Ischemic cardiomyopathy   . Neuropathy    Secondary to chemo  . STEMI (ST elevation myocardial infarction) ([place]) 12/12/2018  . Tobacco abuse     Medications:  (Not in a hospital admission)   Assessment: 33 YOF who presents with SOB found to have rising troponins. Pharmacy consulted to start IV heparin for ACS. H/H wnl. Plt elevated. SCr 1.39  Goal of Therapy:  Heparin level 0.3-0.7 units/ml Monitor platelets by anticoagulation protocol: Yes   Plan:  -Heparin 4000 units IV bolus followed IV heparin at 1050 units/hr  -F/u 6 hr HL -Monitor daily HL, CBC and s/s of bleeding   Albertina Parr,  PharmD., BCPS, BCCCP Clinical Pharmacist Clinical phone for 08/11/19 until 3:30pm: [phone] If after 3:30pm, please refer to [facility] for unit-specific pharmacist

## 2019-08-11 NOTE — H&P (View-Only) (Signed)
Cardiology Consultation:   Patient ID: Chelsea Allison MRN: [id number]; DOB: November 19, 1955  Admit date: 08/10/2019 Date of Consult: 08/11/2019  Primary Care Provider: Beckie Salts, MD [facility] Cardiologist: Sinclair Grooms, MD  [facility] Electrophysiologist:  None    Patient Profile:   Chelsea Allison is a 64 y.o. female with a hx of CAD s/p STEMI 12/2018, COPD, current smoker, hx of lung cancer, HTN, HLD, ischemic cardiomyopathy, chronic systolic heart failure EF 35-40%, and hx of colon cancer (colostomy) who is being seen today for the evaluation of elevated troponin at the request of Dr. Posey Pronto.  History of Present Illness:   Chelsea Allison was admitted 12/2018 with acute lateral wall STEMI due to thrombotic occlusion of the LCx and high grade obstruction fo the proximal RCA with collaterals to the apical LAD, and chronic total occlusion of the proximal LAD. She was treated with DES to LCx and staged DES to RCA. Pt became unresponsive during the procedure and required intubation. Hypoxia and elevated LVEDP prompted placement of intra-aortic balloon pump. Echo with EF 35-40% and multiple wall motion abnormalities. She was treated for acute PNA and COPD exacerbation. She did not follow up with our office. She was discharged on 12/19/17 with DAPT, BB, and ARB. She presented back to [facility] 01/08/2019 with COPD exacerbation. She was intubated in the ER, CTA negative for PE. She was extubated and continued COPD exacerbation treatment. Cardiology was consulted for jaw pain and chest discomfort. She had mildly elevated CE 23 --> 238 felt to be demand ischemia. Echo revealed improved EF to 45-50%.   She presented to [facility] with acute respiratory distress, hypoxia, and unresponsive requiring BiPAP. BNP only mildly elevated and CXR without signs of significant CHF. CTA negative for PE. Pt weaned from BiPAP. CE trended and hs troponin noted at over 11k. Cardiology was consulted.   On my interview, the  patient reported walking to a building then returning to her car. Upon her return, she experienced central chest pain that felt like a pressure in the middle of her chest. This lasted about 10 min and was associated with shortness of breath. Her initial STEMI pain was jaw and right arm pain, not chest pain. Therefore, she attributed her chest pain to her impending respiratory distress. She did not lose consciousness, but states she was "out of it" when EMS arrived. EMS administered nitro x 1 and 2g IV Mg. She has been compliant on her medications, especially DAPT. She does report a similar episodes 2 weeks ago with substernal chest pain and shortness of breath.  She has not had CP since her arrival and has not taken nitro for chest pain recently. No recent illness. COVID-19 negative.   She is very upset that she has not received her brilinta last night or this morning. Heparin drip running.    Past Medical History:  Diagnosis Date  . CAD (coronary artery disease) 10/20 DES to pRCA, DES mCx   . Cancer of lung ([place])    Twice  . Colon cancer ([place])   . COPD (chronic obstructive pulmonary disease) ([place])   . Hyperlipidemia   . Hypertension   . Ischemic cardiomyopathy   . Neuropathy    Secondary to chemo  . STEMI (ST elevation myocardial infarction) ([place]) 12/12/2018  . Tobacco abuse     Past Surgical History:  Procedure Laterality Date  . ABDOMINAL SURGERY     For colon cancer  . COLOSTOMY    . CORONARY STENT INTERVENTION N/A  12/20/2018   Procedure: CORONARY STENT INTERVENTION - Right;  Surgeon: Burnell Blanks, MD;  Location: [place] [facility];  Service: Cardiovascular;  Laterality: N/A;  . CORONARY/GRAFT ACUTE MI REVASCULARIZATION N/A 12/12/2018   Procedure: Coronary/Graft Acute MI Revascularization;  Surgeon: Belva Crome, MD;  Location: [place] [facility];  Service: Cardiovascular;  Laterality: N/A;  . LEFT HEART CATH AND CORONARY ANGIOGRAPHY N/A 12/12/2018   Procedure: LEFT HEART  CATH AND CORONARY ANGIOGRAPHY;  Surgeon: Belva Crome, MD;  Location: [place] [facility];  Service: Cardiovascular;  Laterality: N/A;     Home Medications:  Prior to Admission medications   Medication Sig Start Date End Date Taking? Authorizing Provider  acetaminophen (TYLENOL) 500 MG tablet Take 1,000 mg by mouth daily as needed for mild pain.   Yes [provider]  albuterol (VENTOLIN HFA) 108 (90 Base) MCG/ACT inhaler Inhale 2 puffs into the lungs every 4 (four) hours as needed for shortness of breath or wheezing. 12/29/18  Yes [provider]  ALPRAZolam Duanne Moron) 0.5 MG tablet Take 0.5 mg by mouth 2 (two) times daily as needed for anxiety. 10/24/16  Yes [provider]  aspirin 81 MG chewable tablet Chew 1 tablet (81 mg total) by mouth daily. 12/21/18  Yes Furth, Cadence H, PA-C  BREO ELLIPTA 100-25 MCG/INH AEPB Inhale 1 puff into the lungs daily. 07/21/19  Yes [provider]  buprenorphine (BUTRANS) 20 MCG/HR PTWK Place 1 patch onto the skin once a week. 07/29/19  Yes [provider]  Cyanocobalamin (B-12 PO) Take 1 tablet by mouth every other day.   Yes [provider]  furosemide (LASIX) 40 MG tablet Take 1 tablet (40 mg total) by mouth daily. 01/12/19  Yes Adhikari, Tamsen Meek, MD  ipratropium-albuterol (DUONEB) 0.5-2.5 (3) MG/3ML SOLN Inhale 3 mLs into the lungs every 4 (four) hours as needed for shortness of breath or wheezing. 01/15/16  Yes [provider]  losartan (COZAAR) 25 MG tablet Take 0.5 tablets (12.5 mg total) by mouth daily. 12/20/18  Yes Furth, Cadence H, PA-C  metoprolol succinate (TOPROL-XL) 25 MG 24 hr tablet Take 1 tablet (25 mg total) by mouth daily. 12/20/18  Yes Furth, Cadence H, PA-C  nitroGLYCERIN (NITROSTAT) 0.4 MG SL tablet DISSOLVE ONE TABLET UNDER TONGUE EVERY 5 MINUTES AS NEEDED UP TO 3 DOSES. IF NORELIEF CALL 911. Patient taking differently: Place 0.4 mg under the tongue every 5 (five) minutes as needed for  chest pain.  06/28/19  Yes Furth, Cadence H, PA-C  pantoprazole (PROTONIX) 40 MG tablet Take 1 tablet (40 mg total) by mouth daily. Patient taking differently: Take 40 mg by mouth daily as needed.  01/12/19  Yes Adhikari, Tamsen Meek, MD  potassium chloride SA (KLOR-CON) 20 MEQ tablet Take 1 tablet (20 mEq total) by mouth daily. 01/12/19  Yes Shelly Coss, MD  rosuvastatin (CRESTOR) 10 MG tablet Take 1 tablet (10 mg total) by mouth daily at 6 PM. 12/20/18  Yes Furth, Cadence H, PA-C  rosuvastatin (CRESTOR) 40 MG tablet Take 20 mg by mouth daily. 04/12/19  Yes [provider]  sodium chloride (OCEAN) 0.65 % SOLN nasal spray Place 1 spray into both nostrils as needed for congestion.   Yes [provider]  sucralfate (CARAFATE) 1 g tablet Take 1 g by mouth 4 (four) times daily as needed.  06/29/19  Yes [provider]  ticagrelor (BRILINTA) 90 MG TABS tablet Take 1 tablet (90 mg total) by mouth 2 (two) times daily. 12/20/18  Yes Furth, Cadence H, PA-C  Vitamin D, Ergocalciferol, (DRISDOL) 1.25 MG (50000 UT) CAPS capsule Take 50,000 Units by mouth every 7 (seven) days. 07/04/18  Yes [provider]    Inpatient Medications: Scheduled Meds: . aspirin  81 mg Oral Daily  . guaiFENesin  600 mg Oral BID  . insulin aspart  0-5 Units Subcutaneous QHS  . insulin aspart  0-9 Units Subcutaneous TID WC  . ipratropium-albuterol  3 mL Nebulization Q6H  . methylPREDNISolone (SOLU-MEDROL) injection  60 mg Intravenous Q6H  . metoprolol succinate  25 mg Oral Daily  . rosuvastatin  20 mg Oral Daily  . sodium chloride flush  3 mL Intravenous Q12H  . ticagrelor  90 mg Oral BID   Continuous Infusions: . sodium chloride    . azithromycin    . cefTRIAXone (ROCEPHIN)  IV    . heparin 1,050 Units/hr (08/11/19 0845)   PRN Meds: sodium chloride, acetaminophen, albuterol, ALPRAZolam, sodium chloride flush  Allergies:    Allergies  Allergen Reactions  . Hydrochlorothiazide Other (See  Comments)    headaches  . Hydrocodone Nausea And Vomiting  . Lisinopril Other (See Comments)    Headache   . Oxycodone Nausea Only  . Statins Other (See Comments)    Social History:   Social History   Socioeconomic History  . Marital status: Single    Spouse name: Not on file  . Number of children: Not on file  . Years of education: Not on file  . Highest education level: Not on file  Occupational History  . Not on file  Tobacco Use  . Smoking status: Current Every Day Smoker    Types: Cigarettes  . Smokeless tobacco: Never Used  Substance and Sexual Activity  . Alcohol use: Never  . Drug use: Never  . Sexual activity: Not on file  Other Topics Concern  . Not on file  Social History Narrative   Patient lives in [place].   Social Determinants of Health   Financial Resource Strain:   . Difficulty of Paying Living Expenses:   Food Insecurity:   . Worried About Charity fundraiser in the Last Year:   . Arboriculturist in the Last Year:   Transportation Needs:   . Film/video editor (Medical):   Marland Kitchen Lack of Transportation (Non-Medical):   Physical Activity:   . Days of Exercise per Week:   . Minutes of Exercise per Session:   Stress:   . Feeling of Stress :   Social Connections:   . Frequency of Communication with Friends and Family:   . Frequency of Social Gatherings with Friends and Family:   . Attends Religious Services:   . Active Member of Clubs or Organizations:   . Attends Archivist Meetings:   Marland Kitchen Marital Status:   Intimate Partner Violence:   . Fear of Current or Ex-Partner:   . Emotionally Abused:   Marland Kitchen Physically Abused:   . Sexually Abused:     Family History:    Family History  Problem Relation Age of Onset  . Hypertension Mother   . Hypertension Father      ROS:  Please see the history of present illness.   All other ROS reviewed and negative.     Physical Exam/Data:   Vitals:   08/11/19 0745 08/11/19 0800 08/11/19  0815 08/11/19 0900  BP:    (!) 105/54  Pulse: 74 77 79 74  Resp: 17 20 20 19   Temp:  TempSrc:      SpO2: 98% 99% 99% 99%  Weight:      Height:       No intake or output data in the 24 hours ending 08/11/19 1009 Last 3 Weights 08/10/2019 01/09/2019 01/08/2019  Weight (lbs) 185 lb 185 lb 10 oz 185 lb 3 oz  Weight (kg) 83.915 kg 84.2 kg 84 kg     Body mass index is 27.32 kg/m.  General:  Well nourished, well developed, in no acute distress HEENT: normal Neck: no JVD Vascular: No carotid bruits  Cardiac:  normal S1, S2; RRR; no murmur Lungs:  Wheezing throughout Abd: soft, nontender, no hepatomegaly  Ext: trace edema Musculoskeletal:  No deformities, BUE and BLE strength normal and equal Skin: warm and dry  Neuro:  CNs 2-12 intact, no focal abnormalities noted Psych:  Normal affect   EKG:  The EKG was personally reviewed and demonstrates:  Sinus tachycardia HR 113, nonspecific IVCD (old) Telemetry:  Telemetry was personally reviewed and demonstrates:  Sinus rhythm in the 70s  Relevant CV Studies:  Echo 01/09/20: 1. Technically difficult study, even with use of echo contrast. Mildly  reduced LVEF, with multiple regional wall motion abnormalities. However,  RWMA are mild, most prominent in apical and surrounding walls, and  visually similar to or improved from prior  study.  2. Left ventricular ejection fraction, by visual estimation, is 45 to  50%. The left ventricle has low normal function. There is no left  ventricular hypertrophy.  3. Multiple segmental abnormalities exist. See findings.  4. Definity contrast agent was given IV to delineate the left ventricular  endocardial borders.  5. Left ventricular diastolic function could not be evaluated.  6. The left ventricle demonstrates regional wall motion abnormalities.  7. Global right ventricle was not well visualized.The right ventricular  size is not well visualized. Right vetricular wall thickness was not    assessed.  8. Left atrial size was not well visualized.  9. Right atrial size was not well visualized.  10. The pericardium was not well visualized.  11. Mild to moderate mitral annular calcification.  12. The mitral valve is normal in structure. Trace mitral valve  regurgitation.  13. The tricuspid valve is normal in structure. Tricuspid valve  regurgitation is trivial.  14. The aortic valve is tricuspid. Aortic valve regurgitation is not  visualized. Mild aortic valve sclerosis without stenosis.  15. The pulmonic valve was not well visualized. Pulmonic valve  regurgitation is not visualized.  16. The interatrial septum was not well visualized.    Left heart cath 12/20/19  Prox RCA lesion is 80% stenosed.  A drug-eluting stent was successfully placed using a STENT RESOLUTE ONYX 3.0X12.  Post intervention, there is a 0% residual stenosis.   1. Severe stenosis proximal RCA 2. Successful PTCA/DES x 1 proximal RCA  Continue ASA/Brilinta/statin and beta blocker. DAPT for one year.    Left heart cath 12/12/19:  Acute lateral wall myocardial infarction due to thrombotic occlusion of the circumflex.  High-grade obstruction of the proximal RCA with collaterals to the apical LAD  Chronic total occlusion of the proximal LAD.  PTCA and stent implantation proximal to mid circumflex reducing 100% stenosis to less than 10% with TIMI grade III flow.  Final post dual balloon diameter 2.83 mm using A 22 x 2.75 Onyx.  No postdilatation was performed.  Mid anterior wall akinesis with anteroapical hypokinesis.  LVEDP 25 mmHg.  Mean pulmonary capillary wedge pressure 7 mmHg, PA pressure 24  over 12 mmHg.  Intra-aortic balloon pump at 1:1 pumping.  Altered mental status prior to intubation.  Etiology is unclear but likely related to CO2 narcosis.  Rule out intracranial hemorrhage/bleed in the setting of intense anticoagulation.  RECOMMENDATIONS:   Fluid bolus to increase capillary  wedge pressure to 15 mmHg.  IV Levophed to support systolic blood pressure above 100 mmHg.  One-to-one intra-aortic balloon pump counterpulsation.  Aggrastat x18 hours  Load with Brilinta slurry.  Critical care to help manage acute hypoxic/hypercarbic respiratory failure requiring intubation in the Cath Lab (PCO2 was 81 on arterial blood gas prior to intubation).  Preventive therapy including high-dose statin therapy  If the patient has not had an intracranial bleed or CVA.  Will need pulmonary/critical care help with managing ventilator.  Needs a neurology consultation.  This would be requested because the patient's mental status acutely changed in the Cath Lab prior to intubation.   Echo 12/12/19: 1. Technically difficult study with limited views. Left ventricular  ejection fraction, by visual estimation, is 35 to 40%. Hypokinesis of  apex, anterior/septal/lateral apical walls, mid to basal inferolateral  wall  2. Global right ventricle has normal systolic function.The right  ventricular size is normal.   FINDINGS  Left Ventricle: Left ventricular ejection fraction, by visual estimation,  is 35 to 40%. The left ventricle has moderately decreased function. There  is mildly increased left ventricular hypertrophy. Concentric left  ventricular hypertrophy. Spectral  Doppler shows Left ventricular diastolic Doppler parameters are consistent  with impaired relaxation pattern of LV diastolic filling.    Laboratory Data:  High Sensitivity Troponin:   Recent Labs  Lab 08/10/19 2150 08/11/19 0619  TROPONINIHS 73* 11,509*     Chemistry Recent Labs  Lab 08/10/19 2150 08/11/19 0103 08/11/19 0139  NA 139 145 145  K 4.9 3.9 3.9  CL 103  --   --   CO2 23  --   --   GLUCOSE 362*  --   --   BUN 20  --   --   CREATININE 1.39*  --   --   CALCIUM 9.3  --   --   GFRNONAA 40*  --   --   GFRAA 47*  --   --   ANIONGAP 13  --   --     No results for input(s): PROT, ALBUMIN,  AST, ALT, ALKPHOS, BILITOT in the last 168 hours. Hematology Recent Labs  Lab 08/10/19 2150 08/11/19 0103 08/11/19 0139  WBC 12.2*  --   --   RBC 4.93  --   --   HGB 12.8 14.3 13.6  HCT 44.2 42.0 40.0  MCV 89.7  --   --   MCH 26.0  --   --   MCHC 29.0*  --   --   RDW 15.0  --   --   PLT 449*  --   --    BNP Recent Labs  Lab 08/10/19 2150  BNP 318.9*    DDimer No results for input(s): DDIMER in the last 168 hours.   Radiology/Studies:  CT Angio Chest PE W/Cm &/Or Wo Cm  Result Date: 08/11/2019 CLINICAL DATA:  Shortness of breath EXAM: CT ANGIOGRAPHY CHEST WITH CONTRAST TECHNIQUE: Multidetector CT imaging of the chest was performed using the standard protocol during bolus administration of intravenous contrast. Multiplanar CT image reconstructions and MIPs were obtained to evaluate the vascular anatomy. CONTRAST:  12mL OMNIPAQUE IOHEXOL 350 MG/ML SOLN COMPARISON:  Radiograph same day, CT May 09, 2019 FINDINGS: Cardiovascular: There is a optimal opacification of the pulmonary arteries. There is no central,segmental, or subsegmental filling defects within the pulmonary arteries. The heart is normal in size. No pericardial effusion or thickening. No evidence right heart strain. There is normal three-vessel brachiocephalic anatomy without proximal stenosis. Aortic atherosclerosis is noted. There is calcifications seen at the mitral valve and coronary artery calcifications. Mediastinum/Nodes: No hilar, mediastinal, or axillary adenopathy. Thyroid gland, trachea, and esophagus demonstrate no significant findings. Lungs/Pleura: Again noted is right medial suprahilar scarring and bronchiectasis with surgical sutures. There is no significant change in the ill-defined nodular opacity in the posterior left lower lobe measuring 1 cm, series 6, image 61. There is also a small 8 mm ill-defined opacity seen within the right middle lobe. There is mild hazy ill-defined ground-glass opacity with tree-in-bud  opacities seen throughout both lungs. No pleural effusion. Upper Abdomen: No acute abnormalities present in the visualized portions of the upper abdomen. Musculoskeletal: No chest wall abnormality. No acute or significant osseous findings. Review of the MIP images confirms the above findings. IMPRESSION: No central, segmental, or subsegmental pulmonary embolism. Mild ground-glass tree-in-bud opacities seen throughout both lungs which may be due to infectious or inflammatory process. Stable ill-defined pulmonary nodules in the posterior left lower lobe and right middle lobe as described above. Aortic Atherosclerosis (ICD10-I70.0). Electronically Signed   By: Prudencio Pair M.D.   On: 08/11/2019 04:10   DG Chest Port 1 View  Result Date: 08/10/2019 CLINICAL DATA:  Shortness of breath, right upper lobe cancer status post lobectomy with mediastinal recurrence EXAM: PORTABLE CHEST 1 VIEW COMPARISON:  07/06/2019, 05/09/2019 FINDINGS: Single frontal view of the chest demonstrates postsurgical changes from right upper lobectomy. Increased soft tissue density with spiculated margins at the right hilum, worrisome for progression of known mediastinal recurrence. Progressive post radiation changes fell also be considered. No new airspace disease, effusion, or pneumothorax. No acute bony abnormalities. IMPRESSION: 1. Increased density at the right hilum, with increasing spiculated margins. Differential includes progressive post radiation change versus Recurrent disease. Electronically Signed   By: Randa Ngo M.D.   On: 08/10/2019 22:07       TIMI Risk Score for Unstable Angina or Non-ST Elevation MI:   The patient's TIMI risk score is 4, which indicates a 20% risk of all cause mortality, new or recurrent myocardial infarction or need for urgent revascularization in the next 14 days.    Assessment and Plan:   Hx of STEMI 12/2018 Elevated HS troponin - treated with DES to Cx and staged DES to proximal RCA  (12/2018) - compliant on DAPT - HS troponin 73 --> 11509 - EKG with sinus rhythm and nonspecific IVCD (old) - she describes chest pain concerning for angina - given her history, will plan for cardiac catheterization today - continue heparin - trend 2 more troponins - will give AM dose of brilinta   Chronic systolic and diastolic heart failure Ischemic cardiomyopathy - echo with improved EF to 45-50% - BNP 319 - CXR without significant edema - she does not appear significantly volume overloaded - continue 40 mg lasix PO   Respiratory distress  COPD Current smoker - she responded well to BiPAP and has been weaned to [facility] - unclear what is precipitating her frequent episodes of dyspnea - wheezing throughout - do not suspect CHF exacerbation   Hypertension - continue home regimen: losartan and toprol   Hyperlipidemia 12/13/2018: Cholesterol 239; HDL 38; LDL Cholesterol 172; VLDL 29 12/17/2018: Triglycerides 256 Continue  crestor 10 mg - needs higher dose of crestor      For questions or updates, please contact [place] Please consult [email or web address] for contact info under    Signed, Ledora Bottcher, PA  08/11/2019 10:09 AM  I have personally seen and examined this patient. I agree with the assessment and plan as outlined above.  64 yo female with known CAD with lateral STEMI in October 2020. DES placed in the circumflex and RCA at that time. Chronic mid LAD occlusion. Severe COPD. Admitted with hypoxic respiratory failure preceded by dyspnea and chest pressure. No evidence of CHF. Troponin 11,000.  EKG reviewed by me and shows sinus with chronic lateral T wave abn.  Labs reviewed by me.  My exam:   General: Well developed, well nourished, NAD  HEENT: OP clear, mucus membranes moist  SKIN: warm, dry. No rashes. Neuro: No focal deficits  Musculoskeletal: Muscle strength 5/5 all ext  Psychiatric: Mood and affect normal  Neck: No JVD, no carotid bruits, no  thyromegaly, no lymphadenopathy.  Lungs: bilaterally+ wheezes Cardiovascular: Regular rate and rhythm. No murmurs, gallops or rubs. Abdomen:Soft. Bowel sounds present. Non-tender.  Extremities: No lower extremity edema. Pulses are 2 + in the bilateral DP/PT.  Plan: NSTEMI in setting of respiratory failure: Given history of CAD with recent stenting and elevated troponin with chest pain, will plan to proceed with a cardiac cath later today. Currently no ischemic EKG changes or ongoing chest pain. I suspect that this could represent demand ischemia in setting of hypoxic respiratory failure with known occlusion of the LAD which fills from collaterals. Continue IV heparin. Continue ASA and Brilinta.   I have reviewed the risks, indications, and alternatives to cardiac catheterization, possible angioplasty, and stenting with the patient. Risks include but are not limited to bleeding, infection, vascular injury, stroke, myocardial infection, arrhythmia, kidney injury, radiation-related injury in the case of prolonged fluoroscopy use, emergency cardiac surgery, and death. The patient understands the risks of serious complication is 1-2 in 4098 with diagnostic cardiac cath and 1-2% or less with angioplasty/stenting.  Lauree Chandler 08/11/2019 10:38 AM

## 2019-08-11 NOTE — Progress Notes (Signed)
[facility] PROGRESS NOTE  Patient: Chelsea Allison WUZ:[id number]   PCP: Beckie Salts, MD DOB: 20-Oct-1955   DOA: 08/10/2019   DOS: 08/11/2019    Subjective: still has shortness of breath, reported chest tightness along with respiratory distress last night, mentions she is compliant with all meds and   Objective:  Vitals:   08/11/19 0800 08/11/19 0815  BP:    Pulse: 77 79  Resp: 20 20  Temp:    SpO2: 99% 99%    Bilateral expiratory wheezing. S1-S2 present. Bowel sounds present.  Assessment and plan: Acute COPD exacerbation. Continue current care.  Continue progressive care continue BiPAP as needed.  Acute non-STEMI From demand ischemia SP cardiac catheterization shows no acute blockage that requires intervention Medical management recommended.  Outpatient cardiology consult.  Author: Berle Mull, MD [facility] 08/11/2019 9:17 AM   If 7PM-7AM, please contact night-coverage at [email or web address]

## 2019-08-11 NOTE — Consult Note (Addendum)
Cardiology Consultation:   Patient ID: Chelsea Allison MRN: [id number]; DOB: 1955-10-31  Admit date: 08/10/2019 Date of Consult: 08/11/2019  Primary Care Provider: Beckie Salts, MD [facility] Cardiologist: Sinclair Grooms, MD  [facility] Electrophysiologist:  None    Patient Profile:   Chelsea Allison is a 64 y.o. female with a hx of CAD s/p STEMI 12/2018, COPD, current smoker, hx of lung cancer, HTN, HLD, ischemic cardiomyopathy, chronic systolic heart failure EF 35-40%, and hx of colon cancer (colostomy) who is being seen today for the evaluation of elevated troponin at the request of Dr. Posey Pronto.  History of Present Illness:   Chelsea Allison was admitted 12/2018 with acute lateral wall STEMI due to thrombotic occlusion of the LCx and high grade obstruction fo the proximal RCA with collaterals to the apical LAD, and chronic total occlusion of the proximal LAD. She was treated with DES to LCx and staged DES to RCA. Pt became unresponsive during the procedure and required intubation. Hypoxia and elevated LVEDP prompted placement of intra-aortic balloon pump. Echo with EF 35-40% and multiple wall motion abnormalities. She was treated for acute PNA and COPD exacerbation. She did not follow up with our office. She was discharged on 12/19/17 with DAPT, BB, and ARB. She presented back to [facility] 01/08/2019 with COPD exacerbation. She was intubated in the ER, CTA negative for PE. She was extubated and continued COPD exacerbation treatment. Cardiology was consulted for jaw pain and chest discomfort. She had mildly elevated CE 23 --> 238 felt to be demand ischemia. Echo revealed improved EF to 45-50%.   She presented to [facility] yesterday with acute respiratory distress, hypoxia, and unresponsive requiring BiPAP. BNP only mildly elevated and CXR without signs of significant CHF. CTA negative for PE. Pt weaned from BiPAP. CE trended and hs troponin noted at over 11k. Cardiology was consulted.   On my interview, the  patient reported walking to a building then returning to her car. Upon her return, she experienced central chest pain that felt like a pressure in the middle of her chest. This lasted about 10 min and was associated with shortness of breath. Her initial STEMI pain was jaw and right arm pain, not chest pain. Therefore, she attributed her chest pain to her impending respiratory distress. She did not lose consciousness, but states she was "out of it" when EMS arrived. EMS administered nitro x 1 and 2g IV Mg. She has been compliant on her medications, especially DAPT. She does report a similar episodes 2 weeks ago with substernal chest pain and shortness of breath.  She has not had CP since her arrival and has not taken nitro for chest pain recently. No recent illness. COVID-19 negative.   She is very upset that she has not received her brilinta last night or this morning. Heparin drip running.    Past Medical History:  Diagnosis Date  . CAD (coronary artery disease) 10/20 DES to pRCA, DES mCx   . Cancer of lung ([place])    Twice  . Colon cancer ([place])   . COPD (chronic obstructive pulmonary disease) ([place])   . Hyperlipidemia   . Hypertension   . Ischemic cardiomyopathy   . Neuropathy    Secondary to chemo  . STEMI (ST elevation myocardial infarction) ([place]) 12/12/2018  . Tobacco abuse     Past Surgical History:  Procedure Laterality Date  . ABDOMINAL SURGERY     For colon cancer  . COLOSTOMY    . CORONARY STENT INTERVENTION N/A  12/20/2018   Procedure: CORONARY STENT INTERVENTION - Right;  Surgeon: Burnell Blanks, MD;  Location: [place] CV [facility];  Service: Cardiovascular;  Laterality: N/A;  . CORONARY/GRAFT ACUTE MI REVASCULARIZATION N/A 12/12/2018   Procedure: Coronary/Graft Acute MI Revascularization;  Surgeon: Belva Crome, MD;  Location: [place] [facility];  Service: Cardiovascular;  Laterality: N/A;  . LEFT HEART CATH AND CORONARY ANGIOGRAPHY N/A 12/12/2018   Procedure: LEFT HEART  CATH AND CORONARY ANGIOGRAPHY;  Surgeon: Belva Crome, MD;  Location: [place] [facility];  Service: Cardiovascular;  Laterality: N/A;     Home Medications:  Prior to Admission medications   Medication Sig Start Date End Date Taking? Authorizing Provider  acetaminophen (TYLENOL) 500 MG tablet Take 1,000 mg by mouth daily as needed for mild pain.   Yes [provider]  albuterol (VENTOLIN HFA) 108 (90 Base) MCG/ACT inhaler Inhale 2 puffs into the lungs every 4 (four) hours as needed for shortness of breath or wheezing. 12/29/18  Yes [provider]  ALPRAZolam Duanne Moron) 0.5 MG tablet Take 0.5 mg by mouth 2 (two) times daily as needed for anxiety. 10/24/16  Yes [provider]  aspirin 81 MG chewable tablet Chew 1 tablet (81 mg total) by mouth daily. 12/21/18  Yes Furth, Cadence H, PA-C  BREO ELLIPTA 100-25 MCG/INH AEPB Inhale 1 puff into the lungs daily. 07/21/19  Yes [provider]  buprenorphine (BUTRANS) 20 MCG/HR PTWK Place 1 patch onto the skin once a week. 07/29/19  Yes [provider]  Cyanocobalamin (B-12 PO) Take 1 tablet by mouth every other day.   Yes [provider]  furosemide (LASIX) 40 MG tablet Take 1 tablet (40 mg total) by mouth daily. 01/12/19  Yes Adhikari, Tamsen Meek, MD  ipratropium-albuterol (DUONEB) 0.5-2.5 (3) MG/3ML SOLN Inhale 3 mLs into the lungs every 4 (four) hours as needed for shortness of breath or wheezing. 01/15/16  Yes [provider]  losartan (COZAAR) 25 MG tablet Take 0.5 tablets (12.5 mg total) by mouth daily. 12/20/18  Yes Furth, Cadence H, PA-C  metoprolol succinate (TOPROL-XL) 25 MG 24 hr tablet Take 1 tablet (25 mg total) by mouth daily. 12/20/18  Yes Furth, Cadence H, PA-C  nitroGLYCERIN (NITROSTAT) 0.4 MG SL tablet DISSOLVE ONE TABLET UNDER TONGUE EVERY 5 MINUTES AS NEEDED UP TO 3 DOSES. IF NORELIEF CALL 911. Patient taking differently: Place 0.4 mg under the tongue every 5 (five) minutes as needed for  chest pain.  06/28/19  Yes Furth, Cadence H, PA-C  pantoprazole (PROTONIX) 40 MG tablet Take 1 tablet (40 mg total) by mouth daily. Patient taking differently: Take 40 mg by mouth daily as needed.  01/12/19  Yes Adhikari, Tamsen Meek, MD  potassium chloride SA (KLOR-CON) 20 MEQ tablet Take 1 tablet (20 mEq total) by mouth daily. 01/12/19  Yes Shelly Coss, MD  rosuvastatin (CRESTOR) 10 MG tablet Take 1 tablet (10 mg total) by mouth daily at 6 PM. 12/20/18  Yes Furth, Cadence H, PA-C  rosuvastatin (CRESTOR) 40 MG tablet Take 20 mg by mouth daily. 04/12/19  Yes [provider]  sodium chloride (OCEAN) 0.65 % SOLN nasal spray Place 1 spray into both nostrils as needed for congestion.   Yes [provider]  sucralfate (CARAFATE) 1 g tablet Take 1 g by mouth 4 (four) times daily as needed.  06/29/19  Yes [provider]  ticagrelor (BRILINTA) 90 MG TABS tablet Take 1 tablet (90 mg total) by mouth 2 (two) times daily. 12/20/18  Yes Furth, Cadence H, PA-C  Vitamin D, Ergocalciferol, (DRISDOL) 1.25 MG (50000 UT) CAPS capsule Take 50,000 Units by mouth every 7 (seven) days. 07/04/18  Yes [provider]    Inpatient Medications: Scheduled Meds: . aspirin  81 mg Oral Daily  . guaiFENesin  600 mg Oral BID  . insulin aspart  0-5 Units Subcutaneous QHS  . insulin aspart  0-9 Units Subcutaneous TID WC  . ipratropium-albuterol  3 mL Nebulization Q6H  . methylPREDNISolone (SOLU-MEDROL) injection  60 mg Intravenous Q6H  . metoprolol succinate  25 mg Oral Daily  . rosuvastatin  20 mg Oral Daily  . sodium chloride flush  3 mL Intravenous Q12H  . ticagrelor  90 mg Oral BID   Continuous Infusions: . sodium chloride    . azithromycin    . cefTRIAXone (ROCEPHIN)  IV    . heparin 1,050 Units/hr (08/11/19 0845)   PRN Meds: sodium chloride, acetaminophen, albuterol, ALPRAZolam, sodium chloride flush  Allergies:    Allergies  Allergen Reactions  . Hydrochlorothiazide Other (See  Comments)    headaches  . Hydrocodone Nausea And Vomiting  . Lisinopril Other (See Comments)    Headache   . Oxycodone Nausea Only  . Statins Other (See Comments)    Social History:   Social History   Socioeconomic History  . Marital status: Single    Spouse name: Not on file  . Number of children: Not on file  . Years of education: Not on file  . Highest education level: Not on file  Occupational History  . Not on file  Tobacco Use  . Smoking status: Current Every Day Smoker    Types: Cigarettes  . Smokeless tobacco: Never Used  Substance and Sexual Activity  . Alcohol use: Never  . Drug use: Never  . Sexual activity: Not on file  Other Topics Concern  . Not on file  Social History Narrative   Patient lives in [place].   Social Determinants of Health   Financial Resource Strain:   . Difficulty of Paying Living Expenses:   Food Insecurity:   . Worried About Charity fundraiser in the Last Year:   . Arboriculturist in the Last Year:   Transportation Needs:   . Film/video editor (Medical):   Marland Kitchen Lack of Transportation (Non-Medical):   Physical Activity:   . Days of Exercise per Week:   . Minutes of Exercise per Session:   Stress:   . Feeling of Stress :   Social Connections:   . Frequency of Communication with Friends and Family:   . Frequency of Social Gatherings with Friends and Family:   . Attends Religious Services:   . Active Member of Clubs or Organizations:   . Attends Archivist Meetings:   Marland Kitchen Marital Status:   Intimate Partner Violence:   . Fear of Current or Ex-Partner:   . Emotionally Abused:   Marland Kitchen Physically Abused:   . Sexually Abused:     Family History:    Family History  Problem Relation Age of Onset  . Hypertension Mother   . Hypertension Father      ROS:  Please see the history of present illness.   All other ROS reviewed and negative.     Physical Exam/Data:   Vitals:   08/11/19 0745 08/11/19 0800 08/11/19  0815 08/11/19 0900  BP:    (!) 105/54  Pulse: 74 77 79 74  Resp: 17 20 20 19   Temp:  TempSrc:      SpO2: 98% 99% 99% 99%  Weight:      Height:       No intake or output data in the 24 hours ending 08/11/19 1009 Last 3 Weights 08/10/2019 01/09/2019 01/08/2019  Weight (lbs) 185 lb 185 lb 10 oz 185 lb 3 oz  Weight (kg) 83.915 kg 84.2 kg 84 kg     Body mass index is 27.32 kg/m.  General:  Well nourished, well developed, in no acute distress HEENT: normal Neck: no JVD Vascular: No carotid bruits  Cardiac:  normal S1, S2; RRR; no murmur Lungs:  Wheezing throughout Abd: soft, nontender, no hepatomegaly  Ext: trace edema Musculoskeletal:  No deformities, BUE and BLE strength normal and equal Skin: warm and dry  Neuro:  CNs 2-12 intact, no focal abnormalities noted Psych:  Normal affect   EKG:  The EKG was personally reviewed and demonstrates:  Sinus tachycardia HR 113, nonspecific IVCD (old) Telemetry:  Telemetry was personally reviewed and demonstrates:  Sinus rhythm in the 70s  Relevant CV Studies:  Echo 01/09/20: 1. Technically difficult study, even with use of echo contrast. Mildly  reduced LVEF, with multiple regional wall motion abnormalities. However,  RWMA are mild, most prominent in apical and surrounding walls, and  visually similar to or improved from prior  study.  2. Left ventricular ejection fraction, by visual estimation, is 45 to  50%. The left ventricle has low normal function. There is no left  ventricular hypertrophy.  3. Multiple segmental abnormalities exist. See findings.  4. Definity contrast agent was given IV to delineate the left ventricular  endocardial borders.  5. Left ventricular diastolic function could not be evaluated.  6. The left ventricle demonstrates regional wall motion abnormalities.  7. Global right ventricle was not well visualized.The right ventricular  size is not well visualized. Right vetricular wall thickness was not    assessed.  8. Left atrial size was not well visualized.  9. Right atrial size was not well visualized.  10. The pericardium was not well visualized.  11. Mild to moderate mitral annular calcification.  12. The mitral valve is normal in structure. Trace mitral valve  regurgitation.  13. The tricuspid valve is normal in structure. Tricuspid valve  regurgitation is trivial.  14. The aortic valve is tricuspid. Aortic valve regurgitation is not  visualized. Mild aortic valve sclerosis without stenosis.  15. The pulmonic valve was not well visualized. Pulmonic valve  regurgitation is not visualized.  16. The interatrial septum was not well visualized.    Left heart cath 12/20/19  Prox RCA lesion is 80% stenosed.  A drug-eluting stent was successfully placed using a STENT RESOLUTE ONYX 3.0X12.  Post intervention, there is a 0% residual stenosis.   1. Severe stenosis proximal RCA 2. Successful PTCA/DES x 1 proximal RCA  Continue ASA/Brilinta/statin and beta blocker. DAPT for one year.    Left heart cath 12/12/19:  Acute lateral wall myocardial infarction due to thrombotic occlusion of the circumflex.  High-grade obstruction of the proximal RCA with collaterals to the apical LAD  Chronic total occlusion of the proximal LAD.  PTCA and stent implantation proximal to mid circumflex reducing 100% stenosis to less than 10% with TIMI grade III flow.  Final post dual balloon diameter 2.83 mm using A 22 x 2.75 Onyx.  No postdilatation was performed.  Mid anterior wall akinesis with anteroapical hypokinesis.  LVEDP 25 mmHg.  Mean pulmonary capillary wedge pressure 7 mmHg, PA pressure 24  over 12 mmHg.  Intra-aortic balloon pump at 1:1 pumping.  Altered mental status prior to intubation.  Etiology is unclear but likely related to CO2 narcosis.  Rule out intracranial hemorrhage/bleed in the setting of intense anticoagulation.  RECOMMENDATIONS:   Fluid bolus to increase capillary  wedge pressure to 15 mmHg.  IV Levophed to support systolic blood pressure above 100 mmHg.  One-to-one intra-aortic balloon pump counterpulsation.  Aggrastat x18 hours  Load with Brilinta slurry.  Critical care to help manage acute hypoxic/hypercarbic respiratory failure requiring intubation in the Cath Lab (PCO2 was 81 on arterial blood gas prior to intubation).  Preventive therapy including high-dose statin therapy  If the patient has not had an intracranial bleed or CVA.  Will need pulmonary/critical care help with managing ventilator.  Needs a neurology consultation.  This would be requested because the patient's mental status acutely changed in the Cath Lab prior to intubation.   Echo 12/12/19: 1. Technically difficult study with limited views. Left ventricular  ejection fraction, by visual estimation, is 35 to 40%. Hypokinesis of  apex, anterior/septal/lateral apical walls, mid to basal inferolateral  wall  2. Global right ventricle has normal systolic function.The right  ventricular size is normal.   FINDINGS  Left Ventricle: Left ventricular ejection fraction, by visual estimation,  is 35 to 40%. The left ventricle has moderately decreased function. There  is mildly increased left ventricular hypertrophy. Concentric left  ventricular hypertrophy. Spectral  Doppler shows Left ventricular diastolic Doppler parameters are consistent  with impaired relaxation pattern of LV diastolic filling.    Laboratory Data:  High Sensitivity Troponin:   Recent Labs  Lab 08/10/19 2150 08/11/19 0619  TROPONINIHS 73* 11,509*     Chemistry Recent Labs  Lab 08/10/19 2150 08/11/19 0103 08/11/19 0139  NA 139 145 145  K 4.9 3.9 3.9  CL 103  --   --   CO2 23  --   --   GLUCOSE 362*  --   --   BUN 20  --   --   CREATININE 1.39*  --   --   CALCIUM 9.3  --   --   GFRNONAA 40*  --   --   GFRAA 47*  --   --   ANIONGAP 13  --   --     No results for input(s): PROT, ALBUMIN,  AST, ALT, ALKPHOS, BILITOT in the last 168 hours. Hematology Recent Labs  Lab 08/10/19 2150 08/11/19 0103 08/11/19 0139  WBC 12.2*  --   --   RBC 4.93  --   --   HGB 12.8 14.3 13.6  HCT 44.2 42.0 40.0  MCV 89.7  --   --   MCH 26.0  --   --   MCHC 29.0*  --   --   RDW 15.0  --   --   PLT 449*  --   --    BNP Recent Labs  Lab 08/10/19 2150  BNP 318.9*    DDimer No results for input(s): DDIMER in the last 168 hours.   Radiology/Studies:  CT Angio Chest PE W/Cm &/Or Wo Cm  Result Date: 08/11/2019 CLINICAL DATA:  Shortness of breath EXAM: CT ANGIOGRAPHY CHEST WITH CONTRAST TECHNIQUE: Multidetector CT imaging of the chest was performed using the standard protocol during bolus administration of intravenous contrast. Multiplanar CT image reconstructions and MIPs were obtained to evaluate the vascular anatomy. CONTRAST:  56mL OMNIPAQUE IOHEXOL 350 MG/ML SOLN COMPARISON:  Radiograph same day, CT May 09, 2019 FINDINGS: Cardiovascular: There is a optimal opacification of the pulmonary arteries. There is no central,segmental, or subsegmental filling defects within the pulmonary arteries. The heart is normal in size. No pericardial effusion or thickening. No evidence right heart strain. There is normal three-vessel brachiocephalic anatomy without proximal stenosis. Aortic atherosclerosis is noted. There is calcifications seen at the mitral valve and coronary artery calcifications. Mediastinum/Nodes: No hilar, mediastinal, or axillary adenopathy. Thyroid gland, trachea, and esophagus demonstrate no significant findings. Lungs/Pleura: Again noted is right medial suprahilar scarring and bronchiectasis with surgical sutures. There is no significant change in the ill-defined nodular opacity in the posterior left lower lobe measuring 1 cm, series 6, image 61. There is also a small 8 mm ill-defined opacity seen within the right middle lobe. There is mild hazy ill-defined ground-glass opacity with tree-in-bud  opacities seen throughout both lungs. No pleural effusion. Upper Abdomen: No acute abnormalities present in the visualized portions of the upper abdomen. Musculoskeletal: No chest wall abnormality. No acute or significant osseous findings. Review of the MIP images confirms the above findings. IMPRESSION: No central, segmental, or subsegmental pulmonary embolism. Mild ground-glass tree-in-bud opacities seen throughout both lungs which may be due to infectious or inflammatory process. Stable ill-defined pulmonary nodules in the posterior left lower lobe and right middle lobe as described above. Aortic Atherosclerosis (ICD10-I70.0). Electronically Signed   By: Prudencio Pair M.D.   On: 08/11/2019 04:10   DG Chest Port 1 View  Result Date: 08/10/2019 CLINICAL DATA:  Shortness of breath, right upper lobe cancer status post lobectomy with mediastinal recurrence EXAM: PORTABLE CHEST 1 VIEW COMPARISON:  07/06/2019, 05/09/2019 FINDINGS: Single frontal view of the chest demonstrates postsurgical changes from right upper lobectomy. Increased soft tissue density with spiculated margins at the right hilum, worrisome for progression of known mediastinal recurrence. Progressive post radiation changes fell also be considered. No new airspace disease, effusion, or pneumothorax. No acute bony abnormalities. IMPRESSION: 1. Increased density at the right hilum, with increasing spiculated margins. Differential includes progressive post radiation change versus Recurrent disease. Electronically Signed   By: Randa Ngo M.D.   On: 08/10/2019 22:07       TIMI Risk Score for Unstable Angina or Non-ST Elevation MI:   The patient's TIMI risk score is 4, which indicates a 20% risk of all cause mortality, new or recurrent myocardial infarction or need for urgent revascularization in the next 14 days.    Assessment and Plan:   Hx of STEMI 12/2018 Elevated HS troponin - treated with DES to Cx and staged DES to proximal RCA  (12/2018) - compliant on DAPT - HS troponin 73 --> 11509 - EKG with sinus rhythm and nonspecific IVCD (old) - she describes chest pain concerning for angina - given her history, will plan for cardiac catheterization today - continue heparin - trend 2 more troponins - will give AM dose of brilinta   Chronic systolic and diastolic heart failure Ischemic cardiomyopathy - echo with improved EF to 45-50% - BNP 319 - CXR without significant edema - she does not appear significantly volume overloaded - continue 40 mg lasix PO   Respiratory distress  COPD Current smoker - she responded well to BiPAP and has been weaned to [facility] - unclear what is precipitating her frequent episodes of dyspnea - wheezing throughout - do not suspect CHF exacerbation   Hypertension - continue home regimen: losartan and toprol   Hyperlipidemia 12/13/2018: Cholesterol 239; HDL 38; LDL Cholesterol 172; VLDL 29 12/17/2018: Triglycerides 256 Continue  crestor 10 mg - needs higher dose of crestor      For questions or updates, please contact [place] Please consult [email or web address] for contact info under    Signed, Ledora Bottcher, PA  08/11/2019 10:09 AM  I have personally seen and examined this patient. I agree with the assessment and plan as outlined above.  65 yo female with known CAD with lateral STEMI in October 2020. DES placed in the circumflex and RCA at that time. Chronic mid LAD occlusion. Severe COPD. Admitted with hypoxic respiratory failure preceded by dyspnea and chest pressure. No evidence of CHF. Troponin 11,000.  EKG reviewed by me and shows sinus with chronic lateral T wave abn.  Labs reviewed by me.  My exam:   General: Well developed, well nourished, NAD  HEENT: OP clear, mucus membranes moist  SKIN: warm, dry. No rashes. Neuro: No focal deficits  Musculoskeletal: Muscle strength 5/5 all ext  Psychiatric: Mood and affect normal  Neck: No JVD, no carotid bruits, no  thyromegaly, no lymphadenopathy.  Lungs: bilaterally+ wheezes Cardiovascular: Regular rate and rhythm. No murmurs, gallops or rubs. Abdomen:Soft. Bowel sounds present. Non-tender.  Extremities: No lower extremity edema. Pulses are 2 + in the bilateral DP/PT.  Plan: NSTEMI in setting of respiratory failure: Given history of CAD with recent stenting and elevated troponin with chest pain, will plan to proceed with a cardiac cath later today. Currently no ischemic EKG changes or ongoing chest pain. I suspect that this could represent demand ischemia in setting of hypoxic respiratory failure with known occlusion of the LAD which fills from collaterals. Continue IV heparin. Continue ASA and Brilinta.   I have reviewed the risks, indications, and alternatives to cardiac catheterization, possible angioplasty, and stenting with the patient. Risks include but are not limited to bleeding, infection, vascular injury, stroke, myocardial infection, arrhythmia, kidney injury, radiation-related injury in the case of prolonged fluoroscopy use, emergency cardiac surgery, and death. The patient understands the risks of serious complication is 1-2 in 7048 with diagnostic cardiac cath and 1-2% or less with angioplasty/stenting.  Lauree Chandler 08/11/2019 10:38 AM

## 2019-08-11 NOTE — ED Notes (Signed)
Ordered a Pouch Ostomy Flex per [employer]

## 2019-08-11 NOTE — ED Provider Notes (Signed)
CT PE shows no PE.  +ground glass opacities.  Covid negative.  ABG improved.  Off bipap now.  Dr. Marlowe Sax to admit.   Montine Circle, PA-C 08/11/19 0501    Orpah Greek, MD 08/11/19 [phone]

## 2019-08-12 ENCOUNTER — Inpatient Hospital Stay (HOSPITAL_COMMUNITY): Payer: Medicare HMO

## 2019-08-12 ENCOUNTER — Encounter (HOSPITAL_COMMUNITY): Payer: Self-pay | Admitting: Internal Medicine

## 2019-08-12 ENCOUNTER — Other Ambulatory Visit (HOSPITAL_COMMUNITY): Payer: Medicare HMO

## 2019-08-12 DIAGNOSIS — R0602 Shortness of breath: Secondary | ICD-10-CM

## 2019-08-12 DIAGNOSIS — J189 Pneumonia, unspecified organism: Secondary | ICD-10-CM

## 2019-08-12 DIAGNOSIS — E872 Acidosis: Secondary | ICD-10-CM

## 2019-08-12 DIAGNOSIS — J441 Chronic obstructive pulmonary disease with (acute) exacerbation: Secondary | ICD-10-CM

## 2019-08-12 DIAGNOSIS — Z9911 Dependence on respirator [ventilator] status: Secondary | ICD-10-CM

## 2019-08-12 DIAGNOSIS — R778 Other specified abnormalities of plasma proteins: Secondary | ICD-10-CM

## 2019-08-12 DIAGNOSIS — J9600 Acute respiratory failure, unspecified whether with hypoxia or hypercapnia: Secondary | ICD-10-CM

## 2019-08-12 DIAGNOSIS — J9601 Acute respiratory failure with hypoxia: Secondary | ICD-10-CM

## 2019-08-12 DIAGNOSIS — J9602 Acute respiratory failure with hypercapnia: Secondary | ICD-10-CM

## 2019-08-12 LAB — GLUCOSE, CAPILLARY
Glucose-Capillary: 254 mg/dL — ABNORMAL HIGH (ref 70–99)
Glucose-Capillary: 243 mg/dL — ABNORMAL HIGH (ref 70–99)
Glucose-Capillary: 219 mg/dL — ABNORMAL HIGH (ref 70–99)
Glucose-Capillary: 192 mg/dL — ABNORMAL HIGH (ref 70–99)

## 2019-08-12 LAB — BLOOD GAS, ARTERIAL
Acid-base deficit: 5.8 mmol/L — ABNORMAL HIGH (ref 0.0–2.0)
Bicarbonate: 26.1 mmol/L (ref 20.0–28.0)
FIO2: 100
O2 Saturation: 95.7 %
Patient temperature: 36.7
pCO2 arterial: >120 mmHg (ref 32.0–48.0)
pH, Arterial: 6.924 — CL (ref 7.350–7.450)
pO2, Arterial: 132 mmHg — ABNORMAL HIGH (ref 83.0–108.0)

## 2019-08-12 LAB — POCT I-STAT 7, (LYTES, BLD GAS, ICA,H+H)
Acid-base deficit: 3 mmol/L — ABNORMAL HIGH (ref 0.0–2.0)
Bicarbonate: 25.8 mmol/L (ref 20.0–28.0)
Calcium, Ion: 1.28 mmol/L (ref 1.15–1.40)
HCT: 42 % (ref 36.0–46.0)
Hemoglobin: 14.3 g/dL (ref 12.0–15.0)
O2 Saturation: 100 %
Potassium: 4 mmol/L (ref 3.5–5.1)
Sodium: 141 mmol/L (ref 135–145)
TCO2: 28 mmol/L (ref 22–32)
pCO2 arterial: 59 mmHg — ABNORMAL HIGH (ref 32.0–48.0)
pH, Arterial: 7.249 — ABNORMAL LOW (ref 7.350–7.450)
pO2, Arterial: 384 mmHg — ABNORMAL HIGH (ref 83.0–108.0)

## 2019-08-12 LAB — MAGNESIUM: Magnesium: 2.4 mg/dL (ref 1.7–2.4)

## 2019-08-12 LAB — CBC WITH DIFFERENTIAL/PLATELET
Abs Immature Granulocytes: 0.13 10*3/uL — ABNORMAL HIGH (ref 0.00–0.07)
Basophils Absolute: 0 10*3/uL (ref 0.0–0.1)
Basophils Relative: 0 %
Eosinophils Absolute: 0 10*3/uL (ref 0.0–0.5)
Eosinophils Relative: 0 %
HCT: 45 % (ref 36.0–46.0)
Hemoglobin: 12.7 g/dL (ref 12.0–15.0)
Immature Granulocytes: 1 %
Lymphocytes Relative: 23 %
Lymphs Abs: 4.5 10*3/uL — ABNORMAL HIGH (ref 0.7–4.0)
MCH: 25.7 pg — ABNORMAL LOW (ref 26.0–34.0)
MCHC: 28.2 g/dL — ABNORMAL LOW (ref 30.0–36.0)
MCV: 90.9 fL (ref 80.0–100.0)
Monocytes Absolute: 0.5 10*3/uL (ref 0.1–1.0)
Monocytes Relative: 2 %
Neutro Abs: 14.4 10*3/uL — ABNORMAL HIGH (ref 1.7–7.7)
Neutrophils Relative %: 74 %
Platelets: 439 10*3/uL — ABNORMAL HIGH (ref 150–400)
RBC: 4.95 MIL/uL (ref 3.87–5.11)
RDW: 14.9 % (ref 11.5–15.5)
WBC: 19.5 10*3/uL — ABNORMAL HIGH (ref 4.0–10.5)
nRBC: 0 % (ref 0.0–0.2)

## 2019-08-12 LAB — COMPREHENSIVE METABOLIC PANEL
ALT: 28 U/L (ref 0–44)
AST: 47 U/L — ABNORMAL HIGH (ref 15–41)
Albumin: 3.4 g/dL — ABNORMAL LOW (ref 3.5–5.0)
Alkaline Phosphatase: 96 U/L (ref 38–126)
Anion gap: 10 (ref 5–15)
BUN: 19 mg/dL (ref 8–23)
CO2: 24 mmol/L (ref 22–32)
Calcium: 9 mg/dL (ref 8.9–10.3)
Chloride: 105 mmol/L (ref 98–111)
Creatinine, Ser: 1.11 mg/dL — ABNORMAL HIGH (ref 0.44–1.00)
GFR calc Af Amer: >60 mL/min (ref >60)
GFR calc non Af Amer: 53 mL/min — ABNORMAL LOW (ref >60)
Glucose, Bld: 349 mg/dL — ABNORMAL HIGH (ref 70–99)
Potassium: 4.8 mmol/L (ref 3.5–5.1)
Sodium: 139 mmol/L (ref 135–145)
Total Bilirubin: 0.5 mg/dL (ref 0.3–1.2)
Total Protein: 7.2 g/dL (ref 6.5–8.1)

## 2019-08-12 LAB — TROPONIN I (HIGH SENSITIVITY)
Troponin I (High Sensitivity): 2052 ng/L (ref <18)
Troponin I (High Sensitivity): 2905 ng/L (ref <18)
Troponin I (High Sensitivity): 4198 ng/L (ref <18)

## 2019-08-12 LAB — LACTIC ACID, PLASMA
Lactic Acid, Venous: 2 mmol/L (ref 0.5–1.9)
Lactic Acid, Venous: 2.8 mmol/L (ref 0.5–1.9)

## 2019-08-12 LAB — ECHOCARDIOGRAM COMPLETE
Height: 69 in
Weight: 2955.93 oz

## 2019-08-12 LAB — PROTIME-INR
INR: 1.1 (ref 0.8–1.2)
Prothrombin Time: 13.3 seconds (ref 11.4–15.2)

## 2019-08-12 LAB — MRSA PCR SCREENING: MRSA by PCR: NEGATIVE

## 2019-08-12 LAB — APTT: aPTT: 29 seconds (ref 24–36)

## 2019-08-12 MED ORDER — FENTANYL CITRATE (PF) 100 MCG/2ML IJ SOLN: 50.0000 ug | INTRAMUSCULAR | Status: DC | PRN

## 2019-08-12 MED ORDER — ORAL CARE MOUTH RINSE
15.0000 mL | OROMUCOSAL | Status: DC
  Administered 2019-08-12 – 2019-08-13 (×10): 15 mL via OROMUCOSAL

## 2019-08-12 MED ORDER — CHLORHEXIDINE GLUCONATE 0.12% ORAL RINSE (MEDLINE KIT)
15.0000 mL | Freq: Two times a day (BID) | OROMUCOSAL | Status: DC
  Administered 2019-08-12 – 2019-08-13 (×2): 15 mL via OROMUCOSAL

## 2019-08-12 MED ORDER — METOPROLOL TARTRATE 5 MG/5ML IV SOLN
5.0000 mg | INTRAVENOUS | Status: DC | PRN
  Administered 2019-08-12: 5 mg via INTRAVENOUS
  Filled 2019-08-12: qty 5

## 2019-08-12 MED ORDER — DOCUSATE SODIUM 100 MG PO CAPS: 100.0000 mg | ORAL_CAPSULE | Freq: Two times a day (BID) | ORAL | Status: DC | PRN

## 2019-08-12 MED ORDER — DOCUSATE SODIUM 50 MG/5ML PO LIQD: 100.0000 mg | Freq: Two times a day (BID) | ORAL | Status: DC

## 2019-08-12 MED ORDER — SODIUM CHLORIDE 0.9 % IV SOLN
250.0000 mL | INTRAVENOUS | Status: DC
  Administered 2019-08-12 – 2019-08-18 (×4): 250 mL via INTRAVENOUS

## 2019-08-12 MED ORDER — PERFLUTREN LIPID MICROSPHERE
1.0000 mL | INTRAVENOUS | Status: AC | PRN
  Administered 2019-08-12: 2 mL via INTRAVENOUS
  Filled 2019-08-12: qty 10

## 2019-08-12 MED ORDER — VANCOMYCIN HCL 1500 MG/300ML IV SOLN
1500.0000 mg | Freq: Once | INTRAVENOUS | Status: AC
  Administered 2019-08-12: 1500 mg via INTRAVENOUS
  Filled 2019-08-12: qty 300

## 2019-08-12 MED ORDER — ENOXAPARIN SODIUM 40 MG/0.4ML ~~LOC~~ SOLN
40.0000 mg | SUBCUTANEOUS | Status: DC
  Administered 2019-08-12 – 2019-08-25 (×14): 40 mg via SUBCUTANEOUS
  Filled 2019-08-12 (×14): qty 0.4

## 2019-08-12 MED ORDER — FENTANYL BOLUS VIA INFUSION
50.0000 ug | INTRAVENOUS | Status: DC | PRN
  Administered 2019-08-13: 50 ug via INTRAVENOUS
  Filled 2019-08-12: qty 50

## 2019-08-12 MED ORDER — MIDAZOLAM HCL 2 MG/2ML IJ SOLN
INTRAMUSCULAR | Status: AC
  Filled 2019-08-12: qty 4

## 2019-08-12 MED ORDER — FENTANYL CITRATE (PF) 100 MCG/2ML IJ SOLN
50.0000 ug | Freq: Once | INTRAMUSCULAR | Status: AC
  Administered 2019-08-12: 50 ug via INTRAVENOUS

## 2019-08-12 MED ORDER — NOREPINEPHRINE 4 MG/250ML-% IV SOLN
2.0000 ug/min | INTRAVENOUS | Status: DC
  Administered 2019-08-12: 5 ug/min via INTRAVENOUS

## 2019-08-12 MED ORDER — FUROSEMIDE 10 MG/ML IJ SOLN
INTRAMUSCULAR | Status: AC
  Filled 2019-08-12: qty 4

## 2019-08-12 MED ORDER — FUROSEMIDE 10 MG/ML IJ SOLN
40.0000 mg | Freq: Once | INTRAMUSCULAR | Status: AC
  Administered 2019-08-12: 40 mg via INTRAVENOUS

## 2019-08-12 MED ORDER — PANTOPRAZOLE SODIUM 40 MG IV SOLR
40.0000 mg | Freq: Every day | INTRAVENOUS | Status: DC
  Administered 2019-08-12 – 2019-08-17 (×6): 40 mg via INTRAVENOUS
  Filled 2019-08-12 (×6): qty 40

## 2019-08-12 MED ORDER — FENTANYL 2500MCG IN NS 250ML (10MCG/ML) PREMIX INFUSION
50.0000 ug/h | INTRAVENOUS | Status: DC
  Administered 2019-08-12: 100 ug/h via INTRAVENOUS
  Administered 2019-08-13: 150 ug/h via INTRAVENOUS
  Filled 2019-08-12 (×2): qty 250

## 2019-08-12 MED ORDER — POLYETHYLENE GLYCOL 3350 17 G PO PACK
17.0000 g | PACK | Freq: Every day | ORAL | Status: DC
  Filled 2019-08-12: qty 1

## 2019-08-12 MED ORDER — FENTANYL CITRATE (PF) 100 MCG/2ML IJ SOLN
INTRAMUSCULAR | Status: AC
  Filled 2019-08-12: qty 4

## 2019-08-12 MED ORDER — ALBUTEROL SULFATE (2.5 MG/3ML) 0.083% IN NEBU
2.5000 mg | INHALATION_SOLUTION | RESPIRATORY_TRACT | Status: DC
  Administered 2019-08-12 (×4): 2.5 mg via RESPIRATORY_TRACT
  Filled 2019-08-12 (×4): qty 3

## 2019-08-12 MED ORDER — POLYETHYLENE GLYCOL 3350 17 G PO PACK: 17.0000 g | PACK | Freq: Every day | ORAL | Status: DC | PRN

## 2019-08-12 MED ORDER — CHLORHEXIDINE GLUCONATE 0.12% ORAL RINSE (MEDLINE KIT)
15.0000 mL | Freq: Two times a day (BID) | OROMUCOSAL | Status: DC
  Administered 2019-08-12: 15 mL via OROMUCOSAL

## 2019-08-12 MED ORDER — CHLORHEXIDINE GLUCONATE CLOTH 2 % EX PADS
6.0000 | MEDICATED_PAD | Freq: Every day | CUTANEOUS | Status: DC
  Administered 2019-08-12 – 2019-08-21 (×9): 6 via TOPICAL

## 2019-08-12 MED ORDER — FENTANYL CITRATE (PF) 100 MCG/2ML IJ SOLN
100.0000 ug | Freq: Once | INTRAMUSCULAR | Status: AC
  Administered 2019-08-12: 100 ug via INTRAVENOUS

## 2019-08-12 MED ORDER — VANCOMYCIN HCL 750 MG/150ML IV SOLN
750.0000 mg | Freq: Two times a day (BID) | INTRAVENOUS | Status: DC
  Administered 2019-08-12 – 2019-08-13 (×2): 750 mg via INTRAVENOUS
  Filled 2019-08-12 (×3): qty 150

## 2019-08-12 MED ORDER — DEXMEDETOMIDINE HCL IN NACL 400 MCG/100ML IV SOLN
0.0000 ug/kg/h | INTRAVENOUS | Status: DC
  Administered 2019-08-12 (×2): 1.2 ug/kg/h via INTRAVENOUS
  Administered 2019-08-12: 0.4 ug/kg/h via INTRAVENOUS
  Administered 2019-08-13: 1.2 ug/kg/h via INTRAVENOUS
  Filled 2019-08-12 (×7): qty 100

## 2019-08-12 MED ORDER — SUCCINYLCHOLINE CHLORIDE 20 MG/ML IJ SOLN
80.0000 mg | Freq: Once | INTRAMUSCULAR | Status: AC
  Administered 2019-08-12: 80 mg via INTRAVENOUS
  Filled 2019-08-12: qty 4

## 2019-08-12 MED ORDER — SODIUM CHLORIDE 0.9 % IV SOLN: 250.0000 mL | INTRAVENOUS | Status: DC

## 2019-08-12 MED ORDER — FENTANYL CITRATE (PF) 100 MCG/2ML IJ SOLN
50.0000 ug | INTRAMUSCULAR | Status: DC | PRN
  Administered 2019-08-12: 100 ug via INTRAVENOUS
  Filled 2019-08-12: qty 2

## 2019-08-12 MED ORDER — MIDAZOLAM HCL 2 MG/2ML IJ SOLN
2.0000 mg | Freq: Once | INTRAMUSCULAR | Status: AC
  Administered 2019-08-12: 2 mg via INTRAVENOUS

## 2019-08-12 MED ORDER — NOREPINEPHRINE 4 MG/250ML-% IV SOLN: 0.0000 ug/min | INTRAVENOUS | Status: DC

## 2019-08-12 MED ORDER — DOCUSATE SODIUM 50 MG/5ML PO LIQD
100.0000 mg | Freq: Two times a day (BID) | ORAL | Status: DC
  Administered 2019-08-12 – 2019-08-13 (×2): 100 mg via ORAL
  Filled 2019-08-12 (×4): qty 10

## 2019-08-12 MED ORDER — NOREPINEPHRINE 4 MG/250ML-% IV SOLN
INTRAVENOUS | Status: AC
  Administered 2019-08-12: 4 mg
  Filled 2019-08-12: qty 250

## 2019-08-12 MED ORDER — SODIUM CHLORIDE 0.9 % IV SOLN
2.0000 g | Freq: Two times a day (BID) | INTRAVENOUS | Status: DC
  Administered 2019-08-12 – 2019-08-13 (×3): 2 g via INTRAVENOUS
  Filled 2019-08-12 (×4): qty 2

## 2019-08-12 MED ORDER — ORAL CARE MOUTH RINSE: 15.0000 mL | OROMUCOSAL | Status: DC

## 2019-08-12 MED ORDER — INSULIN ASPART 100 UNIT/ML ~~LOC~~ SOLN
3.0000 [IU] | SUBCUTANEOUS | Status: DC
  Administered 2019-08-12: 6 [IU] via SUBCUTANEOUS
  Administered 2019-08-12 (×2): 9 [IU] via SUBCUTANEOUS
  Administered 2019-08-13 (×2): 3 [IU] via SUBCUTANEOUS
  Administered 2019-08-13: 6 [IU] via SUBCUTANEOUS
  Administered 2019-08-13 (×2): 3 [IU] via SUBCUTANEOUS

## 2019-08-12 NOTE — Progress Notes (Signed)
    Dr Angelena Form consult notred reviewed, presented with chest pain and elevated troponin. Cath yesterday with CTO prox LAD, patent RCA and LCX stents, mid to distal LCX 60%. Suspect demand ischemia due to COPD exacerbation in setting of chronic LAD CTO, medical management. She is on ASA 81, crestor 20mg , brillinta 90mg  bid (has been on at home due to stents 12/2018). Unclear why her home beta blocker and ARB are on hold, would resume unless there is a contranidication. No further cardiac testing planned, we will arrange outpatient f/u   Carlyle Dolly MD  [place] will sign off.   Medication Recommendations:  ASA, brillinta. Resume home toprol and losartan unless contraindication from internal med standpoint.  Other recommendations (labs, testing, etc):  Follow up as an outpatient:  We will arrange f/u 2-3 weeks.    For questions or updates, please contact [place] Please consult [email or web address] for contact info under        Signed, Carlyle Dolly, MD  08/12/2019, 7:26 AM

## 2019-08-12 NOTE — Progress Notes (Signed)
Pt noted in bed with eyes open around 5:30 am watching TV.  She was alert and oriented x 4. She requested for Tylenol for toothache then a few minutes later asked to use her inhaler from home. She was given a breathing treatment by the writer then she became anxious and was given PRN Xanax, it was not effective. HR up 130-150, BP 180/107. She was shaking, became unresponsive, eyes fixed backwards, and diaphoretic. Rapid response paged, on call Jeannette Corpus., NP paged and Dr Posey Pronto also paged.  They all came to assess patient and ordered Metoprolol 5 mg IV x 1, Lasix 40 mg IV x 1, CT, blood gas.  Medications given per order.  Pt has been transferred to [place]. Attempted to give report to receiving RN, but was unsuccessful. Report given to incoming [place].

## 2019-08-12 NOTE — Procedures (Signed)
Intubation Procedure Note Chelsea Allison [id number] 1955-09-16  Procedure: Intubation Indications: Airway protection and maintenance  Procedure Details Consent: Unable to obtain consent because of altered level of consciousness. Time Out: Verified patient identification, verified procedure, site/side was marked, verified correct patient position, special equipment/implants available, medications/allergies/relevent history reviewed, required imaging and test results available.  Performed  Maximum sterile technique was used including gloves, hand hygiene and mask. Glidescope 4 used to directly visualize vocal cords   Evaluation Hemodynamic Status: BP stable throughout; O2 sats: stable throughout Patient's Current Condition: stable Complications: No apparent complications Patient did tolerate procedure well. Chest X-ray ordered to verify placement.  CXR: tube position acceptable.   Johnsie Cancel, NP-C [facility] Contact / Pager information can be found on Amion  08/12/2019, 9:43 AM

## 2019-08-12 NOTE — Progress Notes (Signed)
  Echocardiogram 2D Echocardiogram has been performed.  Chelsea Allison 08/12/2019, 2:15 PM

## 2019-08-12 NOTE — Plan of Care (Signed)
Patient evaluated upon arrival to ICU. In respiratory distress with paradoxical breathing, obtunded.  Intubated emergently.  Intubation performed by Merlene Laughter, NP, directly supervised by me.  Versed 2 mg IV and succinylcholine 80 mg administered.  Grade 1 view, ET tube placed on first attempt.  No desaturations.  Wheezing or rhonchi on exam, thick secretions in ET tube.  ABG drawn during rapid response with severe respiratory acidosis and hypercarbia.  Patient now requiring sedation.  Full notes to follow.  This patient is critically ill with multiple organ system failure which requires frequent high complexity decision making, assessment, support, evaluation, and titration of therapies. This was completed through the application of advanced monitoring technologies and extensive interpretation of multiple databases. During this encounter critical care time was devoted to patient care services described in this note for 30 minutes.   Julian Hy, DO 08/12/19 9:30 AM [facility]

## 2019-08-12 NOTE — Significant Event (Addendum)
Rapid Response Event Note  Overview: Time Called: 5573 Arrival Time: 0645 Event Type: Respiratory Acute onset respiratory distress, tachycardia, less responsive.  Per primary RN, pt complained of tooth pain and PRN Tylenol was given (administered at 0540). Pt then requested a breathing treatment (administered at 0548). Following breathing treatment she became anxious and PRN Xanax was given (administered at [phone]). She then became tachycardic, HR 130-150, and hypertensive, BP 180/107. She was shaking, became unresponsive, eyes fixed upwards, and diaphoretic. RR RN was notified at this time.   Initial Focused Assessment: Pt lying in bed, upward gaze. Obtunded. No blink to threat, but corneal reflex intact. Minimal response to pain- pt moves toes in response to painful stimuli to the thigh. PERRLA, 58mm. Lung sounds are rhonchus, upper airway sounds masking lung fields. Oral suction attempted with minimal clear, sputum return. Pt is warm, diaphoretic to touch. Accessory muscle use and pt appears increasingly fatigued while I am in the room with her. Oxygen saturation 98% on 100% NRB.   VS: BP 186/89, HR 128, RR 33-40, SpO2 96-98% on 100% NRB.  Interventions: -EKG -CXR -ABG: 6.92/ >120/132/26.1  -PCCM consulted by [facility]. Jeannene Patella, NP with PCCM at bedside to evaluate pt. Pt transferred to ICU for intubation.  Event Summary: Name of Physician Notified: X. Blount, NP & Dr. Posey Pronto at 0635(Notified by primary RN, providers at bedside to evaluate pt.) Outcome: Transferred (Comment) Event End Time: [place]

## 2019-08-12 NOTE — Progress Notes (Signed)
Pharmacy Antibiotic Note  Chelsea Allison is a 64 y.o. female admitted on 08/10/2019 with pneumonia.  Pharmacy has been consulted for vancomycin and cefepime dosing. Remains on azithromycin, cultures pending.  Scr improving.  Plan: Vancomycin 1500 mg IV x 1, then 750 mg IV q 12 hrs. Cefepime 2g IV q 12 hrs Watch renal function closely and adjust doses as needed. F/u cultures, clinical course.  Height: 5\' 9"  (175.3 cm) Weight: 83.8 kg (184 lb 11.9 oz) IBW/kg (Calculated) : 66.2  Temp (24hrs), Avg:98.6 F (37 C), Min:97.6 F (36.4 C), Max:99.5 F (37.5 C)  Recent Labs  Lab 08/10/19 2150 08/11/19 1317 08/12/19 0727  WBC 12.2*  --  19.5*  CREATININE 1.39* 1.10* 1.11*  LATICACIDVEN  --   --  2.0*    Estimated Creatinine Clearance: 59.9 mL/min (A) (by C-G formula based on SCr of 1.11 mg/dL (H)).    Allergies  Allergen Reactions   Statins Other (See Comments)   Hydrochlorothiazide Other (See Comments)    headaches   Hydrocodone Nausea And Vomiting   Lisinopril Other (See Comments)    Headache    Oxycodone Nausea Only    Antimicrobials this admission:  Ceftriaxone 6/4 > 6/5 Azithromycin 6/4 > (plan 5d) Vancomycin 6/5 >  Cefepime 6/5>  Dose adjustments this admission:   Microbiology results:  6/5 BCx x 2 >  6/5 Resp Cx >   Thank you for allowing pharmacy to be a part of this patients care.  [place] Crane, Roylene Reason, BCCP Clinical Pharmacist  08/12/2019 10:07 AM   [facility] pharmacy phone numbers are listed on [email or web address]

## 2019-08-12 NOTE — Progress Notes (Signed)
[facility] Progress Note  Patient: Chelsea Allison    XNT:[id number]  DOA: 08/10/2019     Date of Service: the patient was seen and examined on 08/12/2019  Chief Complaint  Patient presents with  . Respiratory Distress   Brief hospital course: Chelsea Allison is a 64 y.o. female with medical history significant of CAD/ STEMI in October 2020 status post PCI, COPD, hypertension, hyperlipidemia, CHF, non-small cell lung cancer stage III status post partial lung resection, anal cancer status post colostomy presented to the ED via EMS for evaluation of respiratory distress.  Hypoxic and placed on CPAP by EMS.  Also given sublingual nitroglycerin x1 and IV magnesium 2 g. Was altered on arrival and unresponsive in ED.  Placed on BiPAP with improvement in mentation and eventually improvement in oxygenation to 3 L of oxygen. Underwent cardiac catheterization due to significant troponin rise which showed no new significant blockage that requires intervention and medical management was recommended. On 08/12/2019 becoming unresponsive again requiring transfer to the ICU. Currently plan is care in the ICU for acute on chronic COPD exacerbation versus CNS pathology.  Assessment and Plan: 1.  Acute on chronic hypoxic and hypercarbic respiratory failure Acute COPD exacerbation Acute diastolic CHF Community-acquired pneumonia Presented with unresponsive event at home progressively from shortness of breath. Placed on BiPAP with improvement with mentation as well as hypercarbia. Stable on 08/11/2019. Become unresponsive again on 08/12/2019. Currently being transferred to the ICU. Treated with IV ceftriaxone and azithromycin, IV Solu-Medrol 60 mg every 6 hours, duo nebs and Lasix as needed.  2.  Coronary artery disease SP PCI in October 2020. Elevated troponin secondary to demand ischemia. Hyperlipidemia SP cardiac catheterization 08/11/2019. No evidence of new blockages significant stenosis that requires  intervention. Medical management recommended. Aspirin Brilinta. Blood pressure was low therefore unable to provide beta-blockers. Continue statin Crestor.  3.  Acute encephalopathy Likely metabolic in nature. CT scan ordered. ABG ordered.   4.  Essential hypertension Blood pressure fluctuating between low and higher numbers. Currently closely observing. As needed Lopressor.  5.  Mild AKI Cardiorenal hemodynamics. Monitor.  Diet: N.p.o. for now DVT Prophylaxis: Subcutaneous Lovenox   Advance goals of care discussion: Full code  Family Communication: Unable to reach family, left voicemail.  Disposition:  Current level of care for the pt is Inpatient Remains inpatient appropriate because:Hemodynamically unstable  Dispo: The patient is from: Home              Anticipated d/c is to: to be determined               Anticipated d/c date is: to be determined               Patient currently is not medically stable to d/c.  Subjective: Reportedly was anxious overnight. Become unresponsive somewhere between 630 and 6:50 AM. Received Xanax at 6:25 AM. At the time of my evaluation not withdrawing to painful stimuli, in respiratory distress.  Physical Exam:  General: Appear in severe distress, no Rash; Oral Mucosa Clear, dry. no Abnormal Neck Mass Or lumps, Conjunctiva normal  Cardiovascular: S1 and S2 Present, no Murmur, Respiratory: increased respiratory effort, Bilateral Air entry present and bilateral Crackles, bilateral  wheezes Abdomen: Bowel Sound present, Soft and difficult to assess  tenderness Extremities: trace Pedal edema, no calf tenderness Neurology: obtunded and not oriented to time, place, and person affect unresponsive.  Minimally responsive to painful stimuli. No purposeful movement identified. Reflexes difficult to assess. Pupils bilaterally reactive and equal.  Gait not checked due to patient safety concerns  Vitals:   08/12/19 0439 08/12/19 0639 08/12/19  0645 08/12/19 0716  BP: 114/65 (!) 174/89 (!) 185/104 137/81  Pulse: 80 (!) 124 (!) 122 (!) 101  Resp: 19 (!) 36 (!) 33 (!) 28  Temp: 97.6 F (36.4 C)     TempSrc: Axillary     SpO2: 99% (!) 72% 95% 98%  Weight: 83.8 kg     Height:        Intake/Output Summary (Last 24 hours) at 08/12/2019 0727 Last data filed at 08/12/2019 0700 Gross per 24 hour  Intake 3 ml  Output 950 ml  Net -947 ml   Filed Weights   08/10/19 2205 08/12/19 0439  Weight: 83.9 kg 83.8 kg    Data Reviewed: I have personally reviewed and interpreted daily labs, tele strips, imagings as discussed above. I reviewed all nursing notes, pharmacy notes, vitals, pertinent old records I have discussed plan of care as described above with RN and patient/family.  CBC: Recent Labs  Lab 08/10/19 2150 08/11/19 0103 08/11/19 0139  WBC 12.2*  --   --   NEUTROABS 7.3  --   --   HGB 12.8 14.3 13.6  HCT 44.2 42.0 40.0  MCV 89.7  --   --   PLT 449*  --   --    Basic Metabolic Panel: Recent Labs  Lab 08/10/19 2150 08/11/19 0103 08/11/19 0139 08/11/19 1317  NA 139 145 145 141  K 4.9 3.9 3.9 4.0  CL 103  --   --  107  CO2 23  --   --  24  GLUCOSE 362*  --   --  87  BUN 20  --   --  20  CREATININE 1.39*  --   --  1.10*  CALCIUM 9.3  --   --  9.2    Studies: CARDIAC CATHETERIZATION  Result Date: 08/11/2019  Previously placed Prox RCA stent (unknown type) is widely patent.  Previously placed Prox Cx to Mid Cx stent (unknown type) is widely patent.  Mid Cx to Dist Cx lesion is 60% stenosed.  Prox LAD lesion is 100% stenosed.  Patent proximal RCA stent Patent Circumflex stent. Unchanged moderate mid Circumflex stenosis Chronic occlusion proximal LAD. The distal LAD and branches fill from right to left collaterals. Recommendations: Elevated troponin likely due to demand ischemia in setting of known LAD chronic occlusion. Her presentation is most c/w COPD exacerbation. Continue medical management of CAD.      Scheduled Meds: . aspirin  81 mg Oral Daily  . furosemide      . guaiFENesin  600 mg Oral BID  . insulin aspart  0-5 Units Subcutaneous QHS  . insulin aspart  0-9 Units Subcutaneous TID WC  . methylPREDNISolone (SOLU-MEDROL) injection  60 mg Intravenous Q6H  . rosuvastatin  20 mg Oral Daily  . sodium chloride flush  3 mL Intravenous Q12H  . sodium chloride flush  3 mL Intravenous Q12H  . ticagrelor  90 mg Oral BID   Continuous Infusions: . sodium chloride    . sodium chloride    . azithromycin 500 mg (08/12/19 0624)  . cefTRIAXone (ROCEPHIN)  IV 1 g (08/12/19 0532)   PRN Meds: sodium chloride, sodium chloride, acetaminophen, albuterol, metoprolol tartrate, ondansetron (ZOFRAN) IV, sodium chloride flush, sodium chloride flush  Time spent: 35 minutes  Author: Berle Mull, MD [facility] 08/12/2019 7:27 AM  To reach On-call, see care teams to locate the  attending and reach out via [email or web address]. Between 7PM-7AM, please contact night-coverage If you still have difficulty reaching the attending provider, please page the [facility] (Director on Call) for [facility] on amion for assistance.

## 2019-08-12 NOTE — Progress Notes (Signed)
Pt transported from [place] [address] to CT on full vent support with full O2 tanks. No complications noted.

## 2019-08-12 NOTE — Consult Note (Signed)
NAME:  Chelsea Allison, MRN:  [id number], DOB:  Apr 24, 1955, LOS: 1 ADMISSION DATE:  08/10/2019, CONSULTATION DATE:  08/12/2019 REFERRING MD:  Dr. Posey Pronto, CHIEF COMPLAINT:  Respiratory failure    Brief History   64 year old female presented with shortness of breath and elevated troponin, underwent cardiac catheterization which was negative.  Treated for acute COPD exasperation with use of BiPAP initially with improvement in mentation seen.  Decompensated early morning of 6/5  with acute change in mentation and respiratory distress around 6 AM.  Transferred to ICU and intubated.  History of present illness   Chelsea Allison is a 64 year old female with a past medical history significant for CAD/STEMI October of 2020 status post PCI, COPD not on chronic supplemental oxygen, hypertension, hyperlipidemia, CHF, non-small cell lung cancer s/p partial lung resection and colon cancer status post colectomy presented for evaluation of acute respiratory distress.  Patient was seen unresponsive on arrival with hypoxia and hypercapnia at which time she was placed on BiPAP therapy with improvement in mentation seen.  Due to elevated troponin and history of significant CAD patient also underwent cardiac catheterization on arrival which was negative for any significant blockages.  Patient was admitted for treatment of acute COPD exasperation.  Morning of 6/5 patient was seen with acute decompensation with unresponsiveness and significant respiratory distress.  Critical care consulted for further management.  On arrival patient was seen unresponsive and in acute respiratory distress.  She had paradoxical breathing with bilateral rhonchi and diffuse wheezing.  ABG was obtained, results remain pending, but due to significant decline patient was transferred to ICU and intubated.  Past Medical History  Tobacco abuse NSTEMI 12/12/2018 status post PCI Neuropathy Type 2 diabetes Ischemic  cardiomyopathy Hypertension Hyperlipidemia COPD Colon cancer Lung cancer CAD with drug-eluting stent times 09 December 2018  Significant Hospital Events   Admitted 6/4  Consults:  PCCM  Procedures:  Intubated 6/5  Significant Diagnostic Tests:  CTA chest 6/4> No central, segmental, or subsegmental pulmonary embolism. Mild ground-glass tree-in-bud opacities seen throughout both lungs which may be due to infectious or inflammatory process. Stable ill-defined pulmonary nodules in the posterior left lower lobe and right middle lobe as described above.  Chest x-ray 6/5 > 1. Interval placement of endotracheal tube and nasogastric tube. 2. Developing LEFT perihilar airspace filling opacities consistent with infiltrate or edema.  Micro Data:  Covid 6/4 > negative Respiratory virus panel 6/4 >  Blood culture 6/4 > MRSA PCR 6/4>  Antimicrobials:  Azithromycin 6/4 Ceftriaxone 6/4  Interim history/subjective:  Unresponsive in acute respiratory distress  Objective   Blood pressure 121/69, pulse (!) 107, temperature 97.6 F (36.4 C), temperature source Axillary, resp. rate 16, height 5\' 9"  (1.753 m), weight 83.8 kg, SpO2 97 %.    Vent Mode: PRVC FiO2 (%):  [100 %] 100 % Set Rate:  [16 bmp] 16 bmp Vt Set:  [500 mL] 500 mL PEEP:  [5 cmH20] 5 cmH20 Plateau Pressure:  [27 cmH20] 27 cmH20   Intake/Output Summary (Last 24 hours) at 08/12/2019 0839 Last data filed at 08/12/2019 0700 Gross per 24 hour  Intake 3 ml  Output 950 ml  Net -947 ml   Filed Weights   08/10/19 2205 08/12/19 0439  Weight: 83.9 kg 83.8 kg    Examination: General: Chronically ill appearing elderly female lying in bed unresponsive on initial assessment in significant respiratory distress HEENT: Normocephalic/atraumatic MM pink/moist, PERRL, sclera nonicteric Neuro: Unresponsive CV: Tachycardia, s1s2 regular rate and rhythm, no murmur, rubs, or gallops,  PULM: Diffuse bilateral rhonchi with significant  inspiratory and expiratory wheezing bilaterally, and significant respiratory distress with paradoxical breathing, oxygen saturations 95-98 on nonrebreather, intubated on arrival to ICU GI: soft, bowel sounds active in all 4 quadrants, non-tender, non-distended, OG in place Extremities: warm/dry, nonpitting lower extremity edema  Skin: no rashes or lesions  Resolved Hospital Problem list     Assessment & Plan:  Acute on chronic hypoxic and hypercapnic respiratory failure -In the setting of community-acquired pneumonia and acute COPD exasperation, ABG at time of decompensation: pH 6.9, PCO2 greater than 120, PO2 132, bicarb 26.1 -Acute decompensation morning of 6/5 resulting in intubation and transfer to ICU Community-acquired pneumonia Acute COPD exasperation P: Continue ventilator support with lung protective strategies  Wean PEEP and FiO2 for sats greater than 90%. Head of bed elevated 30 degrees. Plateau pressures less than 30 cm H20.  Follow intermittent chest x-ray and ABG.   SAT/SBT as tolerated, mentation preclude extubation  Ensure adequate pulmonary hygiene  Follow cultures  VAP bundle in place  PAD protocol Continue steroids Continue IV antibiotics continue bronchodilators When appropriate educate on smoking cessation  Coronary artery disease status post PCI times 08 December 1441 Diastolic congestive heart failure Elevated troponin secondary to demand ischemia -Patient underwent cardiac catheterization this admission with no significant new blockages identified P: Continue medical management Cardiology consulted and following Continuous telemetry Continue statin Continue Brilinta per OG tube Strict intake and output  Follow volume status Diurese as able  Acute encephalopathy -Likely secondary to metabolic derangements, history of hypercapnia P: Correct underlying metabolic derangements Head CT pending Follow ABG Minimize sedation as able  History of  essential hypertension -Home medications include Lasix, Cozaar, metoprolol P: Continue home medications Close monitoring of blood pressure in the ICU setting Continuous telemetry  Acute Kidney Injury  -in the setting of acute respiratory failure Baseline creatinine 0.8-0.9, creatinine on admission 1.39 P: Follow renal function / urine output Trend Bmet Avoid nephrotoxins, ensure adequate renal perfusion  Follow urine output Strict intake and output  Hyperglycemia with no known history of diabetes -Likely worsened with use of steroids currently P: Continue sliding scale insulin Check CBGs every 4 Continue at bedtime coverage Obtain hemoglobin A1c  Best practice:  Diet: N.p.o., start tube feeds when appropriate Pain/Anxiety/Delirium protocol (if indicated): As needed fentanyl, Precedex drip VAP protocol (if indicated): In place DVT prophylaxis: Brilinta, aspirin GI prophylaxis: PPI Glucose control: Sliding scale insulin Mobility: Bedrest Code Status: Full code Family Communication: Updated Disposition: ICU  Labs   CBC: Recent Labs  Lab 08/10/19 2150 08/11/19 0103 08/11/19 0139 08/12/19 0727  WBC 12.2*  --   --  19.5*  NEUTROABS 7.3  --   --  14.4*  HGB 12.8 14.3 13.6 12.7  HCT 44.2 42.0 40.0 45.0  MCV 89.7  --   --  90.9  PLT 449*  --   --  439*    Basic Metabolic Panel: Recent Labs  Lab 08/10/19 2150 08/11/19 0103 08/11/19 0139 08/11/19 1317 08/12/19 0727  NA 139 145 145 141 139  K 4.9 3.9 3.9 4.0 4.8  CL 103  --   --  107 105  CO2 23  --   --  24 24  GLUCOSE 362*  --   --  87 349*  BUN 20  --   --  20 19  CREATININE 1.39*  --   --  1.10* 1.11*  CALCIUM 9.3  --   --  9.2 9.0  MG  --   --   --   --  2.4   GFR: Estimated Creatinine Clearance: 59.9 mL/min (A) (by C-G formula based on SCr of 1.11 mg/dL (H)). Recent Labs  Lab 08/10/19 2150 08/11/19 0619 08/12/19 0727  PROCALCITON  --  1.77  --   WBC 12.2*  --  19.5*  LATICACIDVEN  --   --  2.0*     Liver Function Tests: Recent Labs  Lab 08/12/19 0727  AST 47*  ALT 28  ALKPHOS 96  BILITOT 0.5  PROT 7.2  ALBUMIN 3.4*   No results for input(s): LIPASE, AMYLASE in the last 168 hours. No results for input(s): AMMONIA in the last 168 hours.  ABG    Component Value Date/Time   PHART 7.329 (L) 08/11/2019 0139   PCO2ART 53.2 (H) 08/11/2019 0139   PO2ART 117 (H) 08/11/2019 0139   HCO3 28.3 (H) 08/11/2019 0139   TCO2 30 08/11/2019 0139   ACIDBASEDEF 1.0 08/11/2019 0103   O2SAT 98.0 08/11/2019 0139     Coagulation Profile: Recent Labs  Lab 08/12/19 0727  INR 1.1    Cardiac Enzymes: No results for input(s): CKTOTAL, CKMB, CKMBINDEX, TROPONINI in the last 168 hours.  HbA1C: Hgb A1c MFr Bld  Date/Time Value Ref Range Status  08/11/2019 08:00 AM 5.8 (H) 4.8 - 5.6 % Final    Comment:    (NOTE) Pre diabetes:          5.7%-6.4% Diabetes:              >6.4% Glycemic control for   <7.0% adults with diabetes   01/10/2019 04:12 AM 5.7 (H) 4.8 - 5.6 % Final    Comment:    (NOTE) Pre diabetes:          5.7%-6.4% Diabetes:              >6.4% Glycemic control for   <7.0% adults with diabetes     CBG: Recent Labs  Lab 08/11/19 1321 08/11/19 1746 08/11/19 2153 08/12/19 0645  GLUCAP 84 165* 170* 254*    Review of Systems:   Unable to gather due to acute respiratory distress and encephalopathy  Past Medical History  She,  has a past medical history of CAD (coronary artery disease) 10/20 DES to pRCA, DES mCx, Cancer of lung ([place]), Colon cancer ([place]), COPD (chronic obstructive pulmonary disease) ([place]), Hyperlipidemia, Hypertension, Ischemic cardiomyopathy, Neuropathy, STEMI (ST elevation myocardial infarction) ([place]) (12/12/2018), and Tobacco abuse.   Surgical History    Past Surgical History:  Procedure Laterality Date   ABDOMINAL SURGERY     For colon cancer   COLOSTOMY     CORONARY STENT INTERVENTION N/A 12/20/2018   Procedure: CORONARY STENT  INTERVENTION - Right;  Surgeon: Burnell Blanks, MD;  Location: [place] [facility];  Service: Cardiovascular;  Laterality: N/A;   CORONARY/GRAFT ACUTE MI REVASCULARIZATION N/A 12/12/2018   Procedure: Coronary/Graft Acute MI Revascularization;  Surgeon: Belva Crome, MD;  Location: [place] [facility];  Service: Cardiovascular;  Laterality: N/A;   LEFT HEART CATH AND CORONARY ANGIOGRAPHY N/A 12/12/2018   Procedure: LEFT HEART CATH AND CORONARY ANGIOGRAPHY;  Surgeon: Belva Crome, MD;  Location: [place] [facility];  Service: Cardiovascular;  Laterality: N/A;   LEFT HEART CATH AND CORONARY ANGIOGRAPHY N/A 08/11/2019   Procedure: LEFT HEART CATH AND CORONARY ANGIOGRAPHY;  Surgeon: Burnell Blanks, MD;  Location: [place] [facility];  Service: Cardiovascular;  Laterality: N/A;     Social History   reports that she has been smoking cigarettes. She has never  used smokeless tobacco. She reports that she does not drink alcohol or use drugs.   Family History   Her family history includes Hypertension in her father and mother.   Allergies Allergies  Allergen Reactions   Hydrochlorothiazide Other (See Comments)    headaches   Hydrocodone Nausea And Vomiting   Lisinopril Other (See Comments)    Headache    Oxycodone Nausea Only   Statins Other (See Comments)     Home Medications  Prior to Admission medications   Medication Sig Start Date End Date Taking? Authorizing Provider  acetaminophen (TYLENOL) 500 MG tablet Take 1,000 mg by mouth daily as needed for mild pain.   Yes [provider]  albuterol (VENTOLIN HFA) 108 (90 Base) MCG/ACT inhaler Inhale 2 puffs into the lungs every 4 (four) hours as needed for shortness of breath or wheezing. 12/29/18  Yes [provider]  ALPRAZolam Duanne Moron) 0.5 MG tablet Take 0.5 mg by mouth 2 (two) times daily as needed for anxiety. 10/24/16  Yes [provider]  aspirin 81 MG chewable tablet Chew 1 tablet (81 mg  total) by mouth daily. 12/21/18  Yes Furth, Cadence H, PA-C  BREO ELLIPTA 100-25 MCG/INH AEPB Inhale 1 puff into the lungs daily. 07/21/19  Yes [provider]  buprenorphine (BUTRANS) 20 MCG/HR PTWK Place 1 patch onto the skin once a week. 07/29/19  Yes [provider]  Cyanocobalamin (B-12 PO) Take 1 tablet by mouth every other day.   Yes [provider]  furosemide (LASIX) 40 MG tablet Take 1 tablet (40 mg total) by mouth daily. 01/12/19  Yes Adhikari, Tamsen Meek, MD  ipratropium-albuterol (DUONEB) 0.5-2.5 (3) MG/3ML SOLN Inhale 3 mLs into the lungs every 4 (four) hours as needed for shortness of breath or wheezing. 01/15/16  Yes [provider]  losartan (COZAAR) 25 MG tablet Take 0.5 tablets (12.5 mg total) by mouth daily. 12/20/18  Yes Furth, Cadence H, PA-C  metoprolol succinate (TOPROL-XL) 25 MG 24 hr tablet Take 1 tablet (25 mg total) by mouth daily. 12/20/18  Yes Furth, Cadence H, PA-C  nitroGLYCERIN (NITROSTAT) 0.4 MG SL tablet DISSOLVE ONE TABLET UNDER TONGUE EVERY 5 MINUTES AS NEEDED UP TO 3 DOSES. IF NORELIEF CALL 911. Patient taking differently: Place 0.4 mg under the tongue every 5 (five) minutes as needed for chest pain.  06/28/19  Yes Furth, Cadence H, PA-C  pantoprazole (PROTONIX) 40 MG tablet Take 1 tablet (40 mg total) by mouth daily. Patient taking differently: Take 40 mg by mouth daily as needed.  01/12/19  Yes Adhikari, Tamsen Meek, MD  potassium chloride SA (KLOR-CON) 20 MEQ tablet Take 1 tablet (20 mEq total) by mouth daily. 01/12/19  Yes Shelly Coss, MD  rosuvastatin (CRESTOR) 10 MG tablet Take 1 tablet (10 mg total) by mouth daily at 6 PM. 12/20/18  Yes Furth, Cadence H, PA-C  rosuvastatin (CRESTOR) 40 MG tablet Take 20 mg by mouth daily. 04/12/19  Yes [provider]  sodium chloride (OCEAN) 0.65 % SOLN nasal spray Place 1 spray into both nostrils as needed for congestion.   Yes [provider]  sucralfate (CARAFATE) 1 g tablet Take 1  g by mouth 4 (four) times daily as needed.  06/29/19  Yes [provider]  ticagrelor (BRILINTA) 90 MG TABS tablet Take 1 tablet (90 mg total) by mouth 2 (two) times daily. 12/20/18  Yes Furth, Cadence H, PA-C  Vitamin D, Ergocalciferol, (DRISDOL) 1.25 MG (50000 UT) CAPS capsule Take 50,000 Units by mouth  every 7 (seven) days. 07/04/18  Yes [provider]     Critical care time:    Performed by: Johnsie Cancel   Total critical care time: 55 minutes  Critical care time was exclusive of separately billable procedures and treating other patients.  Critical care was necessary to treat or prevent imminent or life-threatening deterioration.  Critical care was time spent personally by me on the following activities: development of treatment plan with patient and/or surrogate as well as nursing, discussions with consultants, evaluation of patient's response to treatment, examination of patient, obtaining history from patient or surrogate, ordering and performing treatments and interventions, ordering and review of laboratory studies, ordering and review of radiographic studies, pulse oximetry and re-evaluation of patient's condition.  Johnsie Cancel, NP-C [facility] Contact / Pager information can be found on Amion  08/12/2019, 9:36 AM

## 2019-08-13 LAB — BASIC METABOLIC PANEL
Anion gap: 14 (ref 5–15)
BUN: 33 mg/dL — ABNORMAL HIGH (ref 8–23)
CO2: 21 mmol/L — ABNORMAL LOW (ref 22–32)
Calcium: 9.4 mg/dL (ref 8.9–10.3)
Chloride: 107 mmol/L (ref 98–111)
Creatinine, Ser: 1.06 mg/dL — ABNORMAL HIGH (ref 0.44–1.00)
GFR calc Af Amer: >60 mL/min (ref >60)
GFR calc non Af Amer: 56 mL/min — ABNORMAL LOW (ref >60)
Glucose, Bld: 165 mg/dL — ABNORMAL HIGH (ref 70–99)
Potassium: 3.8 mmol/L (ref 3.5–5.1)
Sodium: 142 mmol/L (ref 135–145)

## 2019-08-13 LAB — CBC
HCT: 41 % (ref 36.0–46.0)
Hemoglobin: 12.3 g/dL (ref 12.0–15.0)
MCH: 26.1 pg (ref 26.0–34.0)
MCHC: 30 g/dL (ref 30.0–36.0)
MCV: 86.9 fL (ref 80.0–100.0)
Platelets: 263 10*3/uL (ref 150–400)
RBC: 4.72 MIL/uL (ref 3.87–5.11)
RDW: 14.7 % (ref 11.5–15.5)
WBC: 14.4 10*3/uL — ABNORMAL HIGH (ref 4.0–10.5)
nRBC: 0 % (ref 0.0–0.2)

## 2019-08-13 LAB — MAGNESIUM: Magnesium: 2.3 mg/dL (ref 1.7–2.4)

## 2019-08-13 LAB — POCT I-STAT 7, (LYTES, BLD GAS, ICA,H+H)
Acid-base deficit: 1 mmol/L (ref 0.0–2.0)
Bicarbonate: 23.8 mmol/L (ref 20.0–28.0)
Calcium, Ion: 1.37 mmol/L (ref 1.15–1.40)
HCT: 36 % (ref 36.0–46.0)
Hemoglobin: 12.2 g/dL (ref 12.0–15.0)
O2 Saturation: 96 %
Patient temperature: 97.6
Potassium: 3.5 mmol/L (ref 3.5–5.1)
Sodium: 144 mmol/L (ref 135–145)
TCO2: 25 mmol/L (ref 22–32)
pCO2 arterial: 39.9 mmHg (ref 32.0–48.0)
pH, Arterial: 7.381 (ref 7.350–7.450)
pO2, Arterial: 83 mmHg (ref 83.0–108.0)

## 2019-08-13 LAB — GLUCOSE, CAPILLARY
Glucose-Capillary: 160 mg/dL — ABNORMAL HIGH (ref 70–99)
Glucose-Capillary: 149 mg/dL — ABNORMAL HIGH (ref 70–99)
Glucose-Capillary: 139 mg/dL — ABNORMAL HIGH (ref 70–99)
Glucose-Capillary: 132 mg/dL — ABNORMAL HIGH (ref 70–99)
Glucose-Capillary: 136 mg/dL — ABNORMAL HIGH (ref 70–99)
Glucose-Capillary: 118 mg/dL — ABNORMAL HIGH (ref 70–99)

## 2019-08-13 LAB — BRAIN NATRIURETIC PEPTIDE: B Natriuretic Peptide: 929.4 pg/mL — ABNORMAL HIGH (ref 0.0–100.0)

## 2019-08-13 LAB — LACTIC ACID, PLASMA: Lactic Acid, Venous: 2.5 mmol/L (ref 0.5–1.9)

## 2019-08-13 LAB — PHOSPHORUS: Phosphorus: 3.5 mg/dL (ref 2.5–4.6)

## 2019-08-13 MED ORDER — POTASSIUM CHLORIDE 20 MEQ/15ML (10%) PO SOLN
40.0000 meq | Freq: Every day | ORAL | Status: DC
  Filled 2019-08-13 (×2): qty 30

## 2019-08-13 MED ORDER — ARFORMOTEROL TARTRATE 15 MCG/2ML IN NEBU
15.0000 ug | INHALATION_SOLUTION | Freq: Two times a day (BID) | RESPIRATORY_TRACT | Status: DC
  Administered 2019-08-13 – 2019-08-23 (×20): 15 ug via RESPIRATORY_TRACT
  Filled 2019-08-13 (×19): qty 2

## 2019-08-13 MED ORDER — IPRATROPIUM-ALBUTEROL 0.5-2.5 (3) MG/3ML IN SOLN
3.0000 mL | Freq: Four times a day (QID) | RESPIRATORY_TRACT | Status: DC
  Administered 2019-08-13 – 2019-08-19 (×23): 3 mL via RESPIRATORY_TRACT
  Filled 2019-08-13 (×23): qty 3

## 2019-08-13 MED ORDER — FUROSEMIDE 10 MG/ML IJ SOLN
40.0000 mg | Freq: Once | INTRAMUSCULAR | Status: AC
  Administered 2019-08-13: 40 mg via INTRAVENOUS
  Filled 2019-08-13: qty 4

## 2019-08-13 MED ORDER — BENZOCAINE 10 % MT GEL
OROMUCOSAL | Status: DC | PRN
  Filled 2019-08-13: qty 9

## 2019-08-13 MED ORDER — ORAL CARE MOUTH RINSE: 15.0000 mL | Freq: Two times a day (BID) | OROMUCOSAL | Status: DC

## 2019-08-13 MED ORDER — OXYCODONE HCL 5 MG PO TABS
5.0000 mg | ORAL_TABLET | Freq: Four times a day (QID) | ORAL | Status: DC | PRN
  Administered 2019-08-13 – 2019-08-14 (×2): 5 mg via ORAL
  Filled 2019-08-13 (×2): qty 1

## 2019-08-13 MED ORDER — SODIUM CHLORIDE 0.9 % IV SOLN
1.0000 g | INTRAVENOUS | Status: DC
  Filled 2019-08-13: qty 10

## 2019-08-13 MED ORDER — METHYLPREDNISOLONE SODIUM SUCC 125 MG IJ SOLR
60.0000 mg | Freq: Two times a day (BID) | INTRAMUSCULAR | Status: DC
  Administered 2019-08-14 – 2019-08-15 (×4): 60 mg via INTRAVENOUS
  Filled 2019-08-13 (×5): qty 2

## 2019-08-13 MED ORDER — IPRATROPIUM BROMIDE 0.02 % IN SOLN: 0.5000 mg | Freq: Four times a day (QID) | RESPIRATORY_TRACT | Status: DC

## 2019-08-13 MED ORDER — BUDESONIDE 0.5 MG/2ML IN SUSP
0.5000 mg | Freq: Two times a day (BID) | RESPIRATORY_TRACT | Status: DC
  Administered 2019-08-13 – 2019-08-23 (×20): 0.5 mg via RESPIRATORY_TRACT
  Filled 2019-08-13 (×20): qty 2

## 2019-08-13 MED ORDER — NITROGLYCERIN 0.4 MG SL SUBL
SUBLINGUAL_TABLET | SUBLINGUAL | Status: AC
  Administered 2019-08-13: 0.4 mg
  Filled 2019-08-13: qty 1

## 2019-08-13 MED ORDER — SODIUM CHLORIDE 0.9 % IV SOLN
2.0000 g | INTRAVENOUS | Status: DC
  Administered 2019-08-13 – 2019-08-16 (×4): 2 g via INTRAVENOUS
  Filled 2019-08-13: qty 20
  Filled 2019-08-13 (×2): qty 2
  Filled 2019-08-13: qty 20
  Filled 2019-08-13: qty 2

## 2019-08-13 MED ORDER — ALPRAZOLAM 0.5 MG PO TABS
0.5000 mg | ORAL_TABLET | Freq: Two times a day (BID) | ORAL | Status: DC | PRN
  Administered 2019-08-13: 0.5 mg via ORAL
  Filled 2019-08-13: qty 1

## 2019-08-13 MED ORDER — INSULIN ASPART 100 UNIT/ML ~~LOC~~ SOLN
3.0000 [IU] | Freq: Three times a day (TID) | SUBCUTANEOUS | Status: DC
  Administered 2019-08-14: 9 [IU] via SUBCUTANEOUS

## 2019-08-13 MED ORDER — ALBUTEROL SULFATE (2.5 MG/3ML) 0.083% IN NEBU
2.5000 mg | INHALATION_SOLUTION | Freq: Three times a day (TID) | RESPIRATORY_TRACT | Status: DC
  Administered 2019-08-13: 2.5 mg via RESPIRATORY_TRACT
  Filled 2019-08-13: qty 3

## 2019-08-13 NOTE — Procedures (Signed)
Extubation Procedure Note  Patient Details:   Name: Chelsea Allison DOB: [birth year] MRN: [id number]   Airway Documentation:    Vent end date: 08/13/19 Vent end time: 1213   Evaluation  O2 sats: stable throughout Complications: No apparent complications Patient did tolerate procedure well. Bilateral Breath Sounds: Diminished, Expiratory wheezes   Pt extubated to 5L [place] per MD order. Pts cuff leak was positive and no stridor noted. RT to continue to monitor.  Vilinda Blanks 08/13/2019, 12:14 PM

## 2019-08-13 NOTE — Progress Notes (Signed)
   08/13/19 1728  Assess: MEWS Score  Temp 98.4 F (36.9 C)  BP 132/65  Pulse Rate (!) 122  Resp 20  SpO2 100 %  O2 Device Nasal Cannula  O2 Flow Rate (L/min) 4 L/min  Assess: MEWS Score  MEWS Temp 0  MEWS Systolic 0  MEWS Pulse 2  MEWS RR 0  MEWS LOC 0  MEWS Score 2  MEWS Score Color Yellow  Assess: if the MEWS score is Yellow or Red  Were vital signs taken at a resting state? Yes  Focused Assessment Documented focused assessment  Early Detection of Sepsis Score *See Row Information* High  MEWS guidelines implemented *See Row Information* No, other (Comment)  Treat  MEWS Interventions Administered prn meds/treatments   Patient has arrived on to the unit from [facility]. While getting checked in, patient's started feeling anxious and overwhelmed. Patient's states she is having tooth pain and chest pain. EKG has been taken and prn SL Nitro has been given. Paged critical care NP and ordered prn oxycodone to give to patient for tooth pain. Will continue to monitor.

## 2019-08-13 NOTE — Progress Notes (Signed)
Remaining 83mL of Fentanyl infusion wasted with Fuller Canada, RN in steri cycle.

## 2019-08-13 NOTE — Progress Notes (Signed)
   08/13/19 2011  Assess: MEWS Score  Temp 98.7 F (37.1 C)  BP 129/65  Pulse Rate (!) 115  Resp 18  SpO2 100 %  Assess: MEWS Score  MEWS Temp 0  MEWS Systolic 0  MEWS Pulse 2  MEWS RR 0  MEWS LOC 0  MEWS Score 2  MEWS Score Color Yellow  Assess: if the MEWS score is Yellow or Red  Were vital signs taken at a resting state? Yes  Focused Assessment Documented focused assessment  Early Detection of Sepsis Score *See Row Information* Low  MEWS guidelines implemented *See Row Information* No, previously yellow, continue vital signs every 4 hours  Notify: Charge Nurse/RN  Name of Charge Nurse/RN Notified Kennieth Francois RN  Date Charge Nurse/RN Notified 08/13/19  Time Charge Nurse/RN Notified 2204

## 2019-08-13 NOTE — Plan of Care (Signed)
  Problem: Education: Goal: Knowledge of General Education information will improve Description: Including pain rating scale, medication(s)/side effects and non-pharmacologic comfort measures Outcome: Progressing   Problem: Clinical Measurements: Goal: Diagnostic test results will improve Outcome: Progressing Goal: Respiratory complications will improve Outcome: Progressing Goal: Cardiovascular complication will be avoided Outcome: Progressing   Problem: Pain Managment: Goal: General experience of comfort will improve Outcome: Progressing

## 2019-08-13 NOTE — Progress Notes (Signed)
NAME:  Chelsea Allison, MRN:  [id number], DOB:  [birth year], LOS: 2 ADMISSION DATE:  08/10/2019, CONSULTATION DATE:  08/12/2019 REFERRING MD:  Dr. Posey Pronto, CHIEF COMPLAINT:  Respiratory failure    Brief History   64 year old female presented with shortness of breath and elevated troponin, underwent cardiac catheterization which was negative.  Treated for acute COPD exasperation with use of BiPAP initially with improvement in mentation seen.  Decompensated early morning of 6/5  with acute change in mentation and respiratory distress around 6 AM.  Transferred to ICU and intubated.  History of present illness   Chelsea Allison is a 64 year old female with a past medical history significant for CAD/STEMI October of 2020 status post PCI, COPD not on chronic supplemental oxygen, hypertension, hyperlipidemia, CHF, non-small cell lung cancer s/p partial lung resection and colon cancer status post colectomy presented for evaluation of acute respiratory distress.  Patient was seen unresponsive on arrival with hypoxia and hypercapnia at which time she was placed on BiPAP therapy with improvement in mentation seen.  Due to elevated troponin and history of significant CAD patient also underwent cardiac catheterization on arrival which was negative for any significant blockages.  Patient was admitted for treatment of acute COPD exasperation.  Morning of 6/5 patient was seen with acute decompensation with unresponsiveness and significant respiratory distress.  Critical care consulted for further management.  On arrival patient was seen unresponsive and in acute respiratory distress.  She had paradoxical breathing with bilateral rhonchi and diffuse wheezing.  ABG was obtained, results remain pending, but due to significant decline patient was transferred to ICU and intubated.  Past Medical History  Tobacco abuse NSTEMI 12/12/2018 status post PCI Neuropathy Type 2 diabetes Ischemic  cardiomyopathy Hypertension Hyperlipidemia COPD Colon cancer Lung cancer CAD with drug-eluting stent times 09 December 2018  Significant Hospital Events   Admitted 6/4  Consults:  PCCM  Procedures:  Intubated 6/5  Significant Diagnostic Tests:  CTA chest 6/4> No central, segmental, or subsegmental pulmonary embolism. Mild ground-glass tree-in-bud opacities seen throughout both lungs which may be due to infectious or inflammatory process. Stable ill-defined pulmonary nodules in the posterior left lower lobe and right middle lobe as described above.  Chest x-ray 6/5 > 1. Interval placement of endotracheal tube and nasogastric tube. 2. Developing LEFT perihilar airspace filling opacities consistent with infiltrate or edema.  Head CT 6/5 > 1. No evidence for acute intracranial abnormality. 2. Chronic sinusitis  Micro Data:  Covid 6/4 > negative Respiratory virus panel 6/4 >  Blood culture 6/4 > MRSA PCR 6/4> Respiratory culture 6/5 >  Antimicrobials:  Azithromycin 6/4 > 6/5 Ceftriaxone 6/4 > 6/5 Cefepime 6/5 > Vancomycin 6/5 >  Interim history/subjective:  Lying in bed in no acute distress, currently on Precedex and fentanyl drip alert and oriented with ability to answer all questions.  Utilizing written communication  Objective   Blood pressure 125/70, pulse 77, temperature (!) 96.1 F (35.6 C), temperature source Axillary, resp. rate 19, height 5\' 9"  (1.753 m), weight 83.8 kg, SpO2 99 %.    Vent Mode: PRVC FiO2 (%):  [30 %-100 %] 30 % Set Rate:  [16 bmp] 16 bmp Vt Set:  [500 mL-560 mL] 560 mL PEEP:  [5 cmH20] 5 cmH20 Plateau Pressure:  [19 cmH20-27 cmH20] 23 cmH20   Intake/Output Summary (Last 24 hours) at 08/13/2019 0757 Last data filed at 08/13/2019 0600 Gross per 24 hour  Intake 2306.07 ml  Output 1325 ml  Net 981.07 ml   [employer]  08/10/19 2205 08/12/19 0439  Weight: 83.9 kg 83.8 kg    Examination: General: Chronically ill-appearing elderly  female lying in bed in no acute distress HEENT: ETT, MM pink/moist, PERRL, sclera nonicteric Neuro: Alert and oriented on fentanyl and Precedex drip, able to utilize written communication, nonfocal CV: s1s2 regular rate and rhythm, no murmur, rubs, or gallops,  PULM: Faint expiratory wheeze, no increased work of breathing, currently tolerating ET tube well GI: soft, bowel sounds active in all 4 quadrants, non-tender, non-distended Extremities: warm/dry, no edema  Skin: no rashes or lesions  Resolved Hospital Problem list   Acute encephalopathy -In the setting of respiratory acidosis, head CT negative  Assessment & Plan:  Acute on chronic hypoxic and hypercapnic respiratory failure -In the setting of community-acquired pneumonia and acute COPD exasperation, ABG at time of decompensation: pH 6.9, PCO2 greater than 120, PO2 132, bicarb 26.1 -Acute decompensation morning of 6/5 resulting in intubation and transfer to ICU Community-acquired pneumonia Acute COPD exasperation P: Acidosis resolved on ABG as of this a.m. Mentating well on ET tube in no acute distress, will try SBT this a.m. but can likely extubate Wean PEEP and FiO2 for sats greater than 90%. Head of bed elevated 30 degrees. Plateau pressures less than 30 cm H20.  Follow intermittent chest x-ray and ABG.   Follow cultures  VAP bundle in place  PAD protocol Continue IV antibiotics Continue bronchodilators Once extubated encourage pulmonary hygiene Well need to use BiPAP therapy at at bedtime once extubated  Coronary artery disease status post PCI times 09 December 3530 Diastolic congestive heart failure Elevated troponin secondary to demand ischemia -Patient underwent cardiac catheterization this admission with no significant new blockages identified P: Continue Brilinta Cardiology following Continuous telemetry Continue statin Strict intake and output Follow volume status Diurese  History of essential  hypertension -Home medications include Lasix, Cozaar, metoprolol P: Continue home medications, blood pressure currently well controlled Continuous telemetry  Acute Kidney Injury, improving -in the setting of acute respiratory failure Baseline creatinine 0.8-0.9, creatinine on admission 1.39 P: Continue to follow renal function Monitor urine output Trend Bumex Avoid nephrotoxins  Hyperglycemia with no known history of diabetes -Likely worsened with use of steroids currently P: Hemoglobin A1c 5.8 Continue sliding scale insulin CBGs every 4 Continue at bedtime coverage   Best practice:  Diet: Advance diet as tolerated once extubated Pain/Anxiety/Delirium protocol (if indicated): As needed fentanyl, Precedex drip VAP protocol (if indicated): In place DVT prophylaxis: Brilinta, aspirin GI prophylaxis: PPI Glucose control: Sliding scale insulin Mobility: Bedrest Code Status: Full code Family Communication: Updated Disposition: ICU  Labs   CBC: Recent Labs  Lab 08/10/19 2150 08/11/19 0103 08/11/19 0139 08/12/19 0727 08/12/19 0946 08/13/19 0225 08/13/19 0502  WBC 12.2*  --   --  19.5*  --  14.4*  --   NEUTROABS 7.3  --   --  14.4*  --   --   --   HGB 12.8   < > 13.6 12.7 14.3 12.3 12.2  HCT 44.2   < > 40.0 45.0 42.0 41.0 36.0  MCV 89.7  --   --  90.9  --  86.9  --   PLT 449*  --   --  439*  --  263  --    < > = values in this interval not displayed.    Basic Metabolic Panel: Recent Labs  Lab 08/10/19 2150 08/11/19 0103 08/11/19 1317 08/12/19 0727 08/12/19 0946 08/13/19 0225 08/13/19 0502  NA 139   < >  141 139 141 142 144  K 4.9   < > 4.0 4.8 4.0 3.8 3.5  CL 103  --  107 105  --  107  --   CO2 23  --  24 24  --  21*  --   GLUCOSE 362*  --  87 349*  --  165*  --   BUN 20  --  20 19  --  33*  --   CREATININE 1.39*  --  1.10* 1.11*  --  1.06*  --   CALCIUM 9.3  --  9.2 9.0  --  9.4  --   MG  --   --   --  2.4  --  2.3  --   PHOS  --   --   --   --   --   3.5  --    < > = values in this interval not displayed.   GFR: Estimated Creatinine Clearance: 62.8 mL/min (A) (by C-G formula based on SCr of 1.06 mg/dL (H)). Recent Labs  Lab 08/10/19 2150 08/11/19 0619 08/12/19 0727 08/12/19 1419 08/13/19 0225  PROCALCITON  --  1.77  --   --   --   WBC 12.2*  --  19.5*  --  14.4*  LATICACIDVEN  --   --  2.0* 2.8*  --     Liver Function Tests: Recent Labs  Lab 08/12/19 0727  AST 47*  ALT 28  ALKPHOS 96  BILITOT 0.5  PROT 7.2  ALBUMIN 3.4*   No results for input(s): LIPASE, AMYLASE in the last 168 hours. No results for input(s): AMMONIA in the last 168 hours.  ABG    Component Value Date/Time   PHART 7.381 08/13/2019 0502   PCO2ART 39.9 08/13/2019 0502   PO2ART 83 08/13/2019 0502   HCO3 23.8 08/13/2019 0502   TCO2 25 08/13/2019 0502   ACIDBASEDEF 1.0 08/13/2019 0502   O2SAT 96.0 08/13/2019 0502     Coagulation Profile: Recent Labs  Lab 08/12/19 0727  INR 1.1    Cardiac Enzymes: No results for input(s): CKTOTAL, CKMB, CKMBINDEX, TROPONINI in the last 168 hours.  HbA1C: Hgb A1c MFr Bld  Date/Time Value Ref Range Status  08/11/2019 08:00 AM 5.8 (H) 4.8 - 5.6 % Final    Comment:    (NOTE) Pre diabetes:          5.7%-6.4% Diabetes:              >6.4% Glycemic control for   <7.0% adults with diabetes   01/10/2019 04:12 AM 5.7 (H) 4.8 - 5.6 % Final    Comment:    (NOTE) Pre diabetes:          5.7%-6.4% Diabetes:              >6.4% Glycemic control for   <7.0% adults with diabetes     CBG: Recent Labs  Lab 08/12/19 1537 08/12/19 2111 08/13/19 0132 08/13/19 0344 08/13/19 0747  GLUCAP 219* 192* 160* 149* 139*       Critical care time:    Performed by: Johnsie Cancel   Total critical care time: 35 minutes  Critical care time was exclusive of separately billable procedures and treating other patients.  Critical care was necessary to treat or prevent imminent or life-threatening  deterioration.  Critical care was time spent personally by me on the following activities: development of treatment plan with patient and/or surrogate as well as nursing, discussions with consultants, evaluation of patient's  response to treatment, examination of patient, obtaining history from patient or surrogate, ordering and performing treatments and interventions, ordering and review of laboratory studies, ordering and review of radiographic studies, pulse oximetry and re-evaluation of patient's condition.  Johnsie Cancel, NP-C [facility] Contact / Pager information can be found on Amion  08/13/2019, 7:57 AM

## 2019-08-13 NOTE — Progress Notes (Signed)
Patient has arrived on to the unit from [facility]. Patient complains of tooth pain and chest pain. Patient describes tooth ache as a 10/10 and mid sternal chest pain. EKG has been taken and 1x dose of 0.4mg  SL nitroglycerin at 1735. Paged Rosana Hoes, critical care NP and she ordered prn 5mg  oxycodone. AT 1740, given 1 x dose of prn oxycodone for tooth pain. Patient states she is feeling overwhelmed and anxious about admission and tooth pain. Tele monitor has been applied on patient, will continue to monitor for any changes.

## 2019-08-14 ENCOUNTER — Inpatient Hospital Stay (HOSPITAL_COMMUNITY): Payer: Medicare HMO

## 2019-08-14 LAB — CBC
HCT: 42.7 % (ref 36.0–46.0)
Hemoglobin: 12.7 g/dL (ref 12.0–15.0)
MCH: 26.6 pg (ref 26.0–34.0)
MCHC: 29.7 g/dL — ABNORMAL LOW (ref 30.0–36.0)
MCV: 89.5 fL (ref 80.0–100.0)
Platelets: 434 10*3/uL — ABNORMAL HIGH (ref 150–400)
RBC: 4.77 MIL/uL (ref 3.87–5.11)
RDW: 15.4 % (ref 11.5–15.5)
WBC: 27.9 10*3/uL — ABNORMAL HIGH (ref 4.0–10.5)
nRBC: 0 % (ref 0.0–0.2)

## 2019-08-14 LAB — BLOOD GAS, ARTERIAL
Acid-base deficit: 3.7 mmol/L — ABNORMAL HIGH (ref 0.0–2.0)
Bicarbonate: 26 mmol/L (ref 20.0–28.0)
Drawn by: 418751
FIO2: 50
O2 Saturation: 97.5 %
Patient temperature: 37.2
pCO2 arterial: 100 mmHg (ref 32.0–48.0)
pH, Arterial: 7.045 — CL (ref 7.350–7.450)
pO2, Arterial: 131 mmHg — ABNORMAL HIGH (ref 83.0–108.0)

## 2019-08-14 LAB — BASIC METABOLIC PANEL
Anion gap: 11 (ref 5–15)
BUN: 38 mg/dL — ABNORMAL HIGH (ref 8–23)
CO2: 25 mmol/L (ref 22–32)
Calcium: 9.5 mg/dL (ref 8.9–10.3)
Chloride: 104 mmol/L (ref 98–111)
Creatinine, Ser: 1.28 mg/dL — ABNORMAL HIGH (ref 0.44–1.00)
GFR calc Af Amer: 52 mL/min — ABNORMAL LOW (ref >60)
GFR calc non Af Amer: 44 mL/min — ABNORMAL LOW (ref >60)
Glucose, Bld: 352 mg/dL — ABNORMAL HIGH (ref 70–99)
Potassium: 4.1 mmol/L (ref 3.5–5.1)
Sodium: 140 mmol/L (ref 135–145)

## 2019-08-14 LAB — POCT I-STAT 7, (LYTES, BLD GAS, ICA,H+H)
Acid-Base Excess: 2 mmol/L (ref 0.0–2.0)
Bicarbonate: 29.8 mmol/L — ABNORMAL HIGH (ref 20.0–28.0)
Calcium, Ion: 1.32 mmol/L (ref 1.15–1.40)
HCT: 41 % (ref 36.0–46.0)
Hemoglobin: 13.9 g/dL (ref 12.0–15.0)
O2 Saturation: 100 %
Patient temperature: 97.8
Potassium: 3.5 mmol/L (ref 3.5–5.1)
Sodium: 141 mmol/L (ref 135–145)
TCO2: 32 mmol/L (ref 22–32)
pCO2 arterial: 59.9 mmHg — ABNORMAL HIGH (ref 32.0–48.0)
pH, Arterial: 7.302 — ABNORMAL LOW (ref 7.350–7.450)
pO2, Arterial: 558 mmHg — ABNORMAL HIGH (ref 83.0–108.0)

## 2019-08-14 LAB — GLUCOSE, CAPILLARY
Glucose-Capillary: 268 mg/dL — ABNORMAL HIGH (ref 70–99)
Glucose-Capillary: 247 mg/dL — ABNORMAL HIGH (ref 70–99)
Glucose-Capillary: 116 mg/dL — ABNORMAL HIGH (ref 70–99)
Glucose-Capillary: 117 mg/dL — ABNORMAL HIGH (ref 70–99)
Glucose-Capillary: 142 mg/dL — ABNORMAL HIGH (ref 70–99)

## 2019-08-14 LAB — TRIGLYCERIDES: Triglycerides: 154 mg/dL — ABNORMAL HIGH (ref <150)

## 2019-08-14 LAB — TROPONIN I (HIGH SENSITIVITY)
Troponin I (High Sensitivity): 3431 ng/L (ref <18)
Troponin I (High Sensitivity): 18379 ng/L (ref <18)

## 2019-08-14 MED ORDER — INSULIN ASPART 100 UNIT/ML ~~LOC~~ SOLN
3.0000 [IU] | SUBCUTANEOUS | Status: DC
  Administered 2019-08-14 – 2019-08-15 (×3): 3 [IU] via SUBCUTANEOUS
  Administered 2019-08-15: 6 [IU] via SUBCUTANEOUS
  Administered 2019-08-15 – 2019-08-16 (×4): 3 [IU] via SUBCUTANEOUS
  Administered 2019-08-16: 6 [IU] via SUBCUTANEOUS
  Administered 2019-08-16 (×3): 3 [IU] via SUBCUTANEOUS
  Administered 2019-08-17: 6 [IU] via SUBCUTANEOUS
  Administered 2019-08-17: 9 [IU] via SUBCUTANEOUS
  Administered 2019-08-17: 3 [IU] via SUBCUTANEOUS
  Administered 2019-08-17 (×3): 6 [IU] via SUBCUTANEOUS
  Administered 2019-08-18: 9 [IU] via SUBCUTANEOUS
  Administered 2019-08-18 – 2019-08-19 (×6): 6 [IU] via SUBCUTANEOUS
  Administered 2019-08-19: 9 [IU] via SUBCUTANEOUS
  Administered 2019-08-19: 6 [IU] via SUBCUTANEOUS
  Administered 2019-08-19: 9 [IU] via SUBCUTANEOUS
  Administered 2019-08-19 – 2019-08-20 (×5): 6 [IU] via SUBCUTANEOUS

## 2019-08-14 MED ORDER — DOCUSATE SODIUM 50 MG/5ML PO LIQD
100.0000 mg | Freq: Two times a day (BID) | ORAL | Status: DC
  Administered 2019-08-15 – 2019-08-19 (×9): 100 mg
  Filled 2019-08-14 (×9): qty 10

## 2019-08-14 MED ORDER — FENTANYL CITRATE (PF) 100 MCG/2ML IJ SOLN
50.0000 ug | INTRAMUSCULAR | Status: DC | PRN
  Administered 2019-08-17 – 2019-08-18 (×2): 50 ug via INTRAVENOUS

## 2019-08-14 MED ORDER — GUAIFENESIN 100 MG/5ML PO SOLN: 15.0000 mL | ORAL | Status: DC

## 2019-08-14 MED ORDER — MIDAZOLAM HCL 2 MG/2ML IJ SOLN
2.0000 mg | INTRAMUSCULAR | Status: DC | PRN
  Administered 2019-08-14: 2 mg via INTRAVENOUS
  Filled 2019-08-14: qty 2

## 2019-08-14 MED ORDER — MIDAZOLAM HCL 2 MG/2ML IJ SOLN
2.0000 mg | INTRAMUSCULAR | Status: DC | PRN
  Administered 2019-08-17: 2 mg via INTRAVENOUS
  Filled 2019-08-14 (×2): qty 2

## 2019-08-14 MED ORDER — POLYETHYLENE GLYCOL 3350 17 G PO PACK
17.0000 g | PACK | Freq: Every day | ORAL | Status: DC
  Administered 2019-08-14 – 2019-08-19 (×6): 17 g
  Filled 2019-08-14 (×6): qty 1

## 2019-08-14 MED ORDER — ETOMIDATE 2 MG/ML IV SOLN
20.0000 mg | Freq: Once | INTRAVENOUS | Status: AC
  Administered 2019-08-14: 20 mg via INTRAVENOUS

## 2019-08-14 MED ORDER — FENTANYL CITRATE (PF) 100 MCG/2ML IJ SOLN
50.0000 ug | Freq: Once | INTRAMUSCULAR | Status: AC
  Administered 2019-08-14: 50 ug via INTRAVENOUS

## 2019-08-14 MED ORDER — ROCURONIUM BROMIDE 50 MG/5ML IV SOLN
50.0000 mg | Freq: Once | INTRAVENOUS | Status: AC
  Administered 2019-08-14: 50 mg via INTRAVENOUS

## 2019-08-14 MED ORDER — ACETAMINOPHEN 325 MG PO TABS
650.0000 mg | ORAL_TABLET | ORAL | Status: DC | PRN
  Administered 2019-08-16 – 2019-08-20 (×4): 650 mg
  Filled 2019-08-14 (×4): qty 2

## 2019-08-14 MED ORDER — ASPIRIN 81 MG PO CHEW
81.0000 mg | CHEWABLE_TABLET | Freq: Every day | ORAL | Status: DC
  Administered 2019-08-14 – 2019-08-20 (×7): 81 mg
  Filled 2019-08-14 (×6): qty 1

## 2019-08-14 MED ORDER — FENTANYL CITRATE (PF) 100 MCG/2ML IJ SOLN
50.0000 ug | INTRAMUSCULAR | Status: DC | PRN
  Administered 2019-08-14: 50 ug via INTRAVENOUS
  Administered 2019-08-14: 100 ug via INTRAVENOUS
  Administered 2019-08-14 (×2): 50 ug via INTRAVENOUS
  Administered 2019-08-15 (×2): 100 ug via INTRAVENOUS
  Administered 2019-08-15 – 2019-08-18 (×3): 50 ug via INTRAVENOUS
  Filled 2019-08-14: qty 2

## 2019-08-14 MED ORDER — FENTANYL 2500MCG IN NS 250ML (10MCG/ML) PREMIX INFUSION
0.0000 ug/h | INTRAVENOUS | Status: DC
  Administered 2019-08-14: 100 ug/h via INTRAVENOUS
  Administered 2019-08-14 – 2019-08-15 (×3): 175 ug/h via INTRAVENOUS
  Administered 2019-08-16 – 2019-08-17 (×2): 150 ug/h via INTRAVENOUS
  Administered 2019-08-17: 175 ug/h via INTRAVENOUS
  Administered 2019-08-18: 150 ug/h via INTRAVENOUS
  Administered 2019-08-19: 175 ug/h via INTRAVENOUS
  Filled 2019-08-14 (×9): qty 250

## 2019-08-14 MED ORDER — TICAGRELOR 90 MG PO TABS
90.0000 mg | ORAL_TABLET | Freq: Two times a day (BID) | ORAL | Status: DC
  Administered 2019-08-14 – 2019-08-20 (×13): 90 mg
  Filled 2019-08-14 (×12): qty 1

## 2019-08-14 MED ORDER — ORAL CARE MOUTH RINSE
15.0000 mL | OROMUCOSAL | Status: DC
  Administered 2019-08-14 – 2019-08-20 (×50): 15 mL via OROMUCOSAL

## 2019-08-14 MED ORDER — FENTANYL CITRATE (PF) 100 MCG/2ML IJ SOLN
INTRAMUSCULAR | Status: AC
  Filled 2019-08-14: qty 2

## 2019-08-14 MED ORDER — FUROSEMIDE 10 MG/ML IJ SOLN
40.0000 mg | Freq: Once | INTRAMUSCULAR | Status: AC
  Administered 2019-08-14: 40 mg via INTRAVENOUS
  Filled 2019-08-14: qty 4

## 2019-08-14 MED ORDER — PROPOFOL 1000 MG/100ML IV EMUL
5.0000 ug/kg/min | INTRAVENOUS | Status: DC
  Administered 2019-08-14: 30 ug/kg/min via INTRAVENOUS
  Administered 2019-08-14: 20 ug/kg/min via INTRAVENOUS
  Administered 2019-08-15: 30 ug/kg/min via INTRAVENOUS
  Administered 2019-08-15: 35 ug/kg/min via INTRAVENOUS
  Administered 2019-08-15 (×2): 30 ug/kg/min via INTRAVENOUS
  Administered 2019-08-16: 20 ug/kg/min via INTRAVENOUS
  Administered 2019-08-16: 40 ug/kg/min via INTRAVENOUS
  Administered 2019-08-17: 35 ug/kg/min via INTRAVENOUS
  Administered 2019-08-17: 25 ug/kg/min via INTRAVENOUS
  Administered 2019-08-18: 20 ug/kg/min via INTRAVENOUS
  Administered 2019-08-18: 25.039 ug/kg/min via INTRAVENOUS
  Administered 2019-08-18 – 2019-08-19 (×3): 20 ug/kg/min via INTRAVENOUS
  Filled 2019-08-14 (×16): qty 100

## 2019-08-14 MED ORDER — MIDAZOLAM HCL 2 MG/2ML IJ SOLN
INTRAMUSCULAR | Status: AC
  Filled 2019-08-14: qty 2

## 2019-08-14 MED ORDER — DOCUSATE SODIUM 50 MG/5ML PO LIQD: 100.0000 mg | Freq: Two times a day (BID) | ORAL | Status: DC | PRN

## 2019-08-14 MED ORDER — ROSUVASTATIN CALCIUM 20 MG PO TABS
20.0000 mg | ORAL_TABLET | Freq: Every day | ORAL | Status: DC
  Administered 2019-08-14 – 2019-08-20 (×7): 20 mg
  Filled 2019-08-14 (×7): qty 1

## 2019-08-14 MED ORDER — PROPOFOL 1000 MG/100ML IV EMUL
INTRAVENOUS | Status: AC
  Administered 2019-08-14: 5 ug/kg/min via INTRAVENOUS
  Filled 2019-08-14: qty 100

## 2019-08-14 MED ORDER — INSULIN DETEMIR 100 UNIT/ML ~~LOC~~ SOLN
5.0000 [IU] | Freq: Two times a day (BID) | SUBCUTANEOUS | Status: DC
  Administered 2019-08-14 – 2019-08-22 (×17): 5 [IU] via SUBCUTANEOUS
  Filled 2019-08-14 (×24): qty 0.05

## 2019-08-14 MED ORDER — GUAIFENESIN 100 MG/5ML PO SOLN
15.0000 mL | ORAL | Status: DC
  Administered 2019-08-14 – 2019-08-21 (×38): 300 mg
  Filled 2019-08-14: qty 15
  Filled 2019-08-14: qty 10
  Filled 2019-08-14 (×3): qty 5
  Filled 2019-08-14: qty 10
  Filled 2019-08-14: qty 15
  Filled 2019-08-14 (×2): qty 10
  Filled 2019-08-14 (×4): qty 15
  Filled 2019-08-14: qty 10
  Filled 2019-08-14 (×2): qty 5
  Filled 2019-08-14 (×8): qty 15
  Filled 2019-08-14: qty 10
  Filled 2019-08-14: qty 15
  Filled 2019-08-14: qty 10
  Filled 2019-08-14: qty 15
  Filled 2019-08-14: qty 5
  Filled 2019-08-14: qty 15
  Filled 2019-08-14: qty 5
  Filled 2019-08-14 (×8): qty 15
  Filled 2019-08-14: qty 5
  Filled 2019-08-14: qty 15
  Filled 2019-08-14 (×2): qty 5
  Filled 2019-08-14 (×2): qty 15
  Filled 2019-08-14: qty 5

## 2019-08-14 MED ORDER — POTASSIUM CHLORIDE 20 MEQ/15ML (10%) PO SOLN
40.0000 meq | Freq: Every day | ORAL | Status: DC
  Administered 2019-08-14 – 2019-08-19 (×6): 40 meq
  Filled 2019-08-14 (×5): qty 30

## 2019-08-14 MED ORDER — ALBUTEROL SULFATE (2.5 MG/3ML) 0.083% IN NEBU
2.5000 mg | INHALATION_SOLUTION | RESPIRATORY_TRACT | Status: DC | PRN
  Administered 2019-08-15 – 2019-08-17 (×2): 2.5 mg via RESPIRATORY_TRACT
  Filled 2019-08-14: qty 3

## 2019-08-14 MED ORDER — CHLORHEXIDINE GLUCONATE 0.12% ORAL RINSE (MEDLINE KIT)
15.0000 mL | Freq: Two times a day (BID) | OROMUCOSAL | Status: DC
  Administered 2019-08-14 – 2019-08-24 (×16): 15 mL via OROMUCOSAL

## 2019-08-14 MED FILL — Verapamil HCl IV Soln 2.5 MG/ML: INTRAVENOUS | Qty: 2 | Status: AC

## 2019-08-14 NOTE — Progress Notes (Signed)
NAME:  Chelsea Allison, MRN:  [id number], DOB:  [birth year], LOS: 3 ADMISSION DATE:  08/10/2019, CONSULTATION DATE:  08/12/2019 REFERRING MD:  Dr. Posey Pronto, CHIEF COMPLAINT:  Respiratory failure    Brief History   64 year old female presented with shortness of breath and elevated troponin, underwent cardiac catheterization which was negative.  Treated for acute COPD exasperation with use of BiPAP initially with improvement in mentation seen.  Decompensated early morning of 6/5  with acute change in mentation and respiratory distress around 6 AM.  Transferred to ICU and intubated.  History of present illness   Chelsea Allison is a 64 year old female with a past medical history significant for CAD/STEMI October of 2020 status post PCI, COPD not on chronic supplemental oxygen, hypertension, hyperlipidemia, CHF, non-small cell lung cancer s/p partial lung resection and colon cancer status post colectomy presented for evaluation of acute respiratory distress.  Patient was seen unresponsive on arrival with hypoxia and hypercapnia at which time she was placed on BiPAP therapy with improvement in mentation seen.  Due to elevated troponin and history of significant CAD patient also underwent cardiac catheterization on arrival which was negative for any significant blockages.  Patient was admitted for treatment of acute COPD exasperation.  Morning of 6/5 patient was seen with acute decompensation with unresponsiveness and significant respiratory distress.  Critical care consulted for further management.  On arrival patient was seen unresponsive and in acute respiratory distress.  She had paradoxical breathing with bilateral rhonchi and diffuse wheezing.  ABG was obtained, results remain pending, but due to significant decline patient was transferred to ICU and intubated.   Patient was extubated and transferred out of the ICU on 6/6.  Overnight, pt was anxious and around 0400 on 6/7 became acutely tachypneic and distressed.   She was placed on Bipap and found to have acute hypercarbic respiratory failure.  Past Medical History  Tobacco abuse NSTEMI 12/12/2018 status post PCI Neuropathy Type 2 diabetes Ischemic cardiomyopathy Hypertension Hyperlipidemia COPD Colon cancer Lung cancer CAD with drug-eluting stent times 09 December 2018  Significant Hospital Events   Admitted 6/4  Consults:  PCCM  Procedures:  Intubated 6/5 6/7 Re-inutbated  Significant Diagnostic Tests:  CTA chest 6/4> No central, segmental, or subsegmental pulmonary embolism. Mild ground-glass tree-in-bud opacities seen throughout both lungs which may be due to infectious or inflammatory process. Stable ill-defined pulmonary nodules in the posterior left lower lobe and right middle lobe as described above.  Chest x-ray 6/5 > 1. Interval placement of endotracheal tube and nasogastric tube. 2. Developing LEFT perihilar airspace filling opacities consistent with infiltrate or edema.  Head CT 6/5 > 1. No evidence for acute intracranial abnormality. 2. Chronic sinusitis  Micro Data:  Covid 6/4 > negative Respiratory virus panel 6/4 >  Blood culture 6/4 > MRSA PCR 6/4> Respiratory culture 6/5 >  Antimicrobials:  Azithromycin 6/4 > 6/5 Ceftriaxone 6/4 > 6/5 Cefepime 6/5 > Vancomycin 6/5 >  Interim history/subjective:  Awake, but in significant respiratory distress  Objective   Blood pressure (!) 157/89, pulse (!) 143, temperature 97.8 F (36.6 C), temperature source Axillary, resp. rate (!) 32, height 5\' 9"  (1.753 m), weight 85.2 kg, SpO2 97 %.    Vent Mode: BIPAP FiO2 (%):  [30 %-40 %] 40 % Set Rate:  [15 bmp] 15 bmp PEEP:  [5 cmH20-6 cmH20] 6 cmH20 Pressure Support:  [5 cmH20] 5 cmH20   Intake/Output Summary (Last 24 hours) at 08/14/2019 0557 Last data filed at 08/14/2019 0436 Gross per 24  hour  Intake 1247.41 ml  Output 3625 ml  Net -2377.59 ml   [employer]   08/10/19 2205 08/12/19 0439 08/14/19 0432  Weight:  83.9 kg 83.8 kg 85.2 kg   General:  Elderly F in significant respiratory distress HEENT: MM pink/moist, bipap mask on Neuro: not able to answer questions due to respiratory distress, but awake and nodding head to questions CV: s1s2 tachycardic, no m/r/g PULM:  Expiratory wheezing bilaterally L>R GI: soft, bsx4 active  Extremities: warm/dry, no edema  Skin: no rashes or lesions   Resolved Hospital Problem list   Acute encephalopathy -In the setting of respiratory acidosis, head CT negative  Assessment & Plan:   Acute on chronic hypoxic and hypercapnic respiratory failure Admitted 6/4 with CAP and COPD exacerbation and intubated, treated and extubated with sudden worsening on 6/7 requiring transfer back to the ICU and re-intubation -not on bipap overnight, worsening hypercarbia with possible flash pulmonary edema component -Duonebs, Lasix 40mg  and check troponin --Maintain full vent support with SAT/SBT as tolerated -titrate Vent setting to maintain SpO2 greater than or equal to 90%. -HOB elevated 30 degrees. -Plateau pressures less than 30 cm H20.  -Follow chest x-ray, ABG prn.   -Bronchial hygiene and RT/bronchodilator protocol. -continue Brovana and Pulmicort -On Azithromycin and Solumedrol 60mg  bid    CAD s/p PCIx2 in 2020, HFrEF Cardiac cath this admission with chronic LAD blockage, no significant new lesions.  EF worsened to 30-35% this admission from 45-50% 6 months ago -Lasix 40mg  this morning and check BNP and troponin -Continue Brillinta and statin and Asa -Cards following -I &O   History of essential hypertension -Home medications include Lasix, Cozaar, metoprolol P: -Holding Cozaar and metoprolol in the setting of borderline BP after extubation  Acute Kidney Injury, improving Improving and stable -Continue to follow renal indices  Type 2 DM -SSI q4hrs     Best practice:  Diet: NPO Pain/Anxiety/Delirium protocol (if indicated):Propofol/Fentanyl VAP  protocol (if indicated): HOB 30 degrees, suction prn  DVT prophylaxis: lovenox GI prophylaxis: PPI Glucose control: Sliding scale insulin Mobility: Bedrest Code Status: Full code Family Communication: Updated pt's uncle Disposition: ICU  Labs   CBC: Recent Labs  Lab 08/10/19 2150 08/11/19 0103 08/11/19 0139 08/12/19 0727 08/12/19 0946 08/13/19 0225 08/13/19 0502  WBC 12.2*  --   --  19.5*  --  14.4*  --   NEUTROABS 7.3  --   --  14.4*  --   --   --   HGB 12.8   < > 13.6 12.7 14.3 12.3 12.2  HCT 44.2   < > 40.0 45.0 42.0 41.0 36.0  MCV 89.7  --   --  90.9  --  86.9  --   PLT 449*  --   --  439*  --  263  --    < > = values in this interval not displayed.    Basic Metabolic Panel: Recent Labs  Lab 08/10/19 2150 08/11/19 0103 08/11/19 1317 08/12/19 0727 08/12/19 0946 08/13/19 0225 08/13/19 0502  NA 139   < > 141 139 141 142 144  K 4.9   < > 4.0 4.8 4.0 3.8 3.5  CL 103  --  107 105  --  107  --   CO2 23  --  24 24  --  21*  --   GLUCOSE 362*  --  87 349*  --  165*  --   BUN 20  --  20 19  --  33*  --  CREATININE 1.39*  --  1.10* 1.11*  --  1.06*  --   CALCIUM 9.3  --  9.2 9.0  --  9.4  --   MG  --   --   --  2.4  --  2.3  --   PHOS  --   --   --   --   --  3.5  --    < > = values in this interval not displayed.   GFR: Estimated Creatinine Clearance: 63.3 mL/min (A) (by C-G formula based on SCr of 1.06 mg/dL (H)). Recent Labs  Lab 08/10/19 2150 08/11/19 0619 08/12/19 0727 08/12/19 1419 08/13/19 0225 08/13/19 0815  PROCALCITON  --  1.77  --   --   --   --   WBC 12.2*  --  19.5*  --  14.4*  --   LATICACIDVEN  --   --  2.0* 2.8*  --  2.5*    Liver Function Tests: Recent Labs  Lab 08/12/19 0727  AST 47*  ALT 28  ALKPHOS 96  BILITOT 0.5  PROT 7.2  ALBUMIN 3.4*   No results for input(s): LIPASE, AMYLASE in the last 168 hours. No results for input(s): AMMONIA in the last 168 hours.  ABG    Component Value Date/Time   PHART 7.381 08/13/2019 0502     PCO2ART 39.9 08/13/2019 0502   PO2ART 83 08/13/2019 0502   HCO3 23.8 08/13/2019 0502   TCO2 25 08/13/2019 0502   ACIDBASEDEF 1.0 08/13/2019 0502   O2SAT 96.0 08/13/2019 0502     Coagulation Profile: Recent Labs  Lab 08/12/19 0727  INR 1.1    Cardiac Enzymes: No results for input(s): CKTOTAL, CKMB, CKMBINDEX, TROPONINI in the last 168 hours.  HbA1C: Hgb A1c MFr Bld  Date/Time Value Ref Range Status  08/11/2019 08:00 AM 5.8 (H) 4.8 - 5.6 % Final    Comment:    (NOTE) Pre diabetes:          5.7%-6.4% Diabetes:              >6.4% Glycemic control for   <7.0% adults with diabetes   01/10/2019 04:12 AM 5.7 (H) 4.8 - 5.6 % Final    Comment:    (NOTE) Pre diabetes:          5.7%-6.4% Diabetes:              >6.4% Glycemic control for   <7.0% adults with diabetes     CBG: Recent Labs  Lab 08/13/19 0747 08/13/19 1141 08/13/19 1545 08/13/19 2128 08/14/19 0519  GLUCAP 139* 132* 136* 118* 268*       Critical care time:  45 minutes   Performed by: Otilio Carpen Evarose Altland   CRITICAL CARE Performed by: Otilio Carpen Hitoshi Werts   Total critical care time: 45 minutes  Critical care time was exclusive of separately billable procedures and treating other patients.  Critical care was necessary to treat or prevent imminent or life-threatening deterioration.  Critical care was time spent personally by me on the following activities: development of treatment plan with patient and/or surrogate as well as nursing, discussions with consultants, evaluation of patient's response to treatment, examination of patient, obtaining history from patient or surrogate, ordering and performing treatments and interventions, ordering and review of laboratory studies, ordering and review of radiographic studies, pulse oximetry and re-evaluation of patient's condition.   Otilio Carpen Ethelyne Erich, PA-C

## 2019-08-14 NOTE — Progress Notes (Signed)
NAME:  Chelsea Allison, MRN:  [id number], DOB:  [birth year], LOS: 3 ADMISSION DATE:  08/10/2019, CONSULTATION DATE:  08/12/2019 REFERRING MD:  Dr. Posey Pronto, CHIEF COMPLAINT:  Respiratory failure    Brief History   64yo f with a PMHx of CAD/STEMI in Oct. 2020 (s/p PCI), COPD on home O2, NSCLC s/p partial lung resection, CHF presented with shortness of breath and elevated troponin. Negative cardiac cath and treated for acute COPD exasperation with improvement. Admitted to ICU on 6/5 and transferred out following improvement. Decompensated with acute mental status change and respiratory distress on 6/7. Reintubated and transferred to ICU.  Past Medical History  Tobacco abuse NSTEMI 12/12/2018 status post PCI Neuropathy Type 2 diabetes Ischemic cardiomyopathy Hypertension Hyperlipidemia COPD Colon cancer Lung cancer CAD with drug-eluting stent times 09 December 2018  [facility] Events   6/4 - admitted 6/5 - Pt became unresponsive, obtunded, in significant respiratory distress. Transferred to ICU and intubated.  6/6 - Extubated and transferred out of ICU.  6/7 - Pt in distress and acutely tachypneic, found to be in hypercarbic resp failure. Reintubated and readmitted to ICU  Consults:  PCCM  Procedures:  6/5 - Intubation (extubated 6/6) 6/7 - Re-intubated  Significant Diagnostic Tests:  CTA chest 6/4> No central, segmental, or subsegmental pulmonary embolism. Mild ground-glass tree-in-bud opacities seen throughout both lungs (infectious vs. Inflammatory). Stable ill-defined pulmonary nodules in the posterior left lower lobe and right middle lobe.  Chest x-ray 6/5 > ETT and NGT in place. Developing LEFT perihilar airspace filling opacities consistent with infiltrate or edema.  Head CT 6/5 > No evidence for acute intracranial abnormality.  Echo 6/5 > estimated LVEF 35-40% with mild concentric LVH.   Micro Data:  Covid 6/4 > negative Respiratory virus panel 6/4 >  Blood culture  6/4 > no growth 3 days MRSA PCR 6/4> negative Respiratory culture 6/5 > few Staph aureus  Antimicrobials:  Azithromycin 6/4  Ceftriaxone 6/4 > 6/5. Restarted 6/6 Cefepime 6/5 > 6/6 Vancomycin 6/5 > 6/6  Interim history/subjective:  Intubated and sedated. ETT in place. PEEP 5 and FiO2 60. Spontaneously moving bilateral toes.  Objective   Blood pressure (!) 150/90, pulse (!) 112, temperature 98.5 F (36.9 C), resp. rate (!) 28, height 5\' 9"  (1.753 m), weight 85.2 kg, SpO2 100 %.    Vent Mode: PRVC FiO2 (%):  [30 %-100 %] 60 % Set Rate:  [15 bmp-28 bmp] 28 bmp Vt Set:  [520 mL] 520 mL PEEP:  [5 cmH20-6 cmH20] 5 cmH20 Pressure Support:  [5 cmH20] 5 cmH20 Plateau Pressure:  [28 cmH20-29 cmH20] 28 cmH20   Intake/Output Summary (Last 24 hours) at 08/14/2019 1017 Last data filed at 08/14/2019 0700 Gross per 24 hour  Intake 987.26 ml  Output 2950 ml  Net -1962.74 ml   Filed Weights   08/10/19 2205 08/12/19 0439 08/14/19 0432  Weight: 83.9 kg 83.8 kg 85.2 kg   General:  Elderly female, resting in bed, intubated, no acute in respiratory distress HEENT: [place]/AT, mucous membranes pink/moist, ETT in place CV: RRR, no m/r/g PULM:  Expiratory wheezing on L side with rhonchi, R lung clear. GI: soft, non-distended, active bowel sounds Extremities: warm/dry, no edema  Skin: no rashes or lesions Neuro: Sedated  Labs: WBC 27.9 Troponin 3431 <--4000s Glucose 352 Cr 1.28 <--1.06 ABG post-intubation 7.3/59.9/558 (from 7.045/100/131)  Resolved Hospital Problem list     Assessment & Plan:   Acute on chronic hypoxic and hypercapnic respiratory failure Admitted 6/4 with CAP and COPD  exacerbation and intubated. Treated and extubated with sudden worsening on 6/7, requiring transfer back to the ICU and re-intubation. ABG improved to pH 7.3 (from 7.04) following intubation.  - Intubated, on propofol and fentanyl. Titrate vent setting to maintain SpO2 greater than or equal to 90%. - Brovana,  Pulmicort - Ceftriaxone, azithromycin - Solumedrol 60mg  BID - Follow CXR, ABG as needed - HOB elevated 30 degrees  CAD s/p PCIx2 in 2020, HFrEF Cardiac cath this admission with chronic LAD blockage, no significant new lesions. EF worsened to 30-35% from 45-50% 6 months ago.  - Hold Lasix given clinically appropriate volume status  - Continue Brillinta, statin, Asa - Cardiology following, appreciate recs - Strict I/Os   History of essential hypertension Home medications include Lasix, Cozaar, metoprolol -Holding Cozaar and metoprolol in the setting of borderline BP after extubation  Acute Kidney Injury - Cr 1.28 today from 1.06 yesterday.  - Will check FEUrea to determine prerenal vs. Intrinsic cause - Follow renal indices  Type 2 DM. Elevated BG (350s) in the setting of steroid use. - SSI q4hrs - Levemir 5u BID  Best practice:  Diet: NPO Pain/Anxiety/Delirium protocol (if indicated):Propofol/Fentanyl VAP protocol (if indicated): HOB 30 degrees, suction prn  DVT prophylaxis: lovenox GI prophylaxis: PPI Glucose control: Sliding scale insulin Mobility: Bedrest Code Status: Full code Family Communication: Updated pt's uncle Disposition: ICU     Critical care time:

## 2019-08-14 NOTE — Progress Notes (Signed)
RT called to pt room for SOB/WOB RT immediately placed pt on BiPAP per increased WOB. RN has contacted [facility] since pt is CCM pt. Pt has crackles in all lung fields, RT asked RN to have stat CXR ordered. RT obtained ABG and sent to lab, now awaiting results.

## 2019-08-14 NOTE — Progress Notes (Signed)
Pt transferred to [place], report given to Luanne Bras.

## 2019-08-14 NOTE — Progress Notes (Signed)
   08/14/19 0441  Vitals  Temp 97.9 F (36.6 C)  Temp Source Oral  BP (!) 149/84  MAP (mmHg) 103  BP Location Left Arm  BP Method Automatic  Patient Position (if appropriate) Lying  Pulse Rate 91  Pulse Rate Source Dinamap  Resp 18  Oxygen Therapy  SpO2 94 %  MEWS Score  MEWS Temp 0  MEWS Systolic 0  MEWS Pulse 0  MEWS RR 0  MEWS LOC 0  MEWS Score 0  MEWS Score Color Green  Pt c/o SOB, labored breathing, RRT notfied, pt placed on Bipap per RT. Dr. James Ivanoff notified.

## 2019-08-14 NOTE — Progress Notes (Signed)
CRITICAL VALUE ALERT  Critical Value:  Troponin 18376  Date & Time Notied:  08/14/2019 08/14/2019   Provider Notified: Yes  Orders Received/Actions taken: None at this time

## 2019-08-14 NOTE — Procedures (Addendum)
Intubation Procedure Note Chelsea Allison [id number] 1956-03-06  Procedure: Intubation Indications: Respiratory insufficiency  Procedure Details Consent: Unable to obtain consent because of emergent medical necessity. Time Out: Verified patient identification, verified procedure, site/side was marked, verified correct patient position, special equipment/implants available, medications/allergies/relevent history reviewed, required imaging and test results available.  Performed  Maximum sterile technique was used including cap, gloves, hand hygiene and mask.  Fentanyl 50, etomidate 20, rocuronium 43m.  MAC and 3 7.5 ET tube.  Easy.  Placed to 22cm at teeth.    Evaluation Hemodynamic Status: BP stable throughout; O2 sats: stable throughout Mild relative hypotension, systolic in 947Mprior to medications Patient's Current Condition: stable Complications: No apparent complications Patient did tolerate procedure well. Chest X-ray ordered to verify placement.  CXR: pending.   Chelsea Bullock6/09/2019

## 2019-08-14 NOTE — Progress Notes (Signed)
Pt admitted to unit via unit bed, on bipap, tachycardic, rapid response and RN at bedside. Pt was intubated upon unit arrival. CCM MD at bedside.

## 2019-08-14 NOTE — Progress Notes (Signed)
   08/14/19 0532  Assess: MEWS Score  Temp 97.8 F (36.6 C)  BP (!) 157/89  Pulse Rate (!) 143  Resp (!) 32  Level of Consciousness Alert  SpO2 97 %  O2 Device Bi-PAP  Assess: MEWS Score  MEWS Temp 0  MEWS Systolic 0  MEWS Pulse 3  MEWS RR 2  MEWS LOC 0  MEWS Score 5  MEWS Score Color Red  Assess: if the MEWS score is Yellow or Red  Were vital signs taken at a resting state? Yes  Focused Assessment Documented focused assessment  Early Detection of Sepsis Score *See Row Information* Low  MEWS guidelines implemented *See Row Information* Yes  Treat  MEWS Interventions Consulted Respiratory Therapy;Escalated (See documentation below)  Take Vital Signs  Increase Vital Sign Frequency  Red: Q 1hr X 4 then Q 4hr X 4, if remains red, continue Q 4hrs  Escalate  MEWS: Escalate Red: discuss with charge nurse/RN and provider, consider discussing with RRT  Notify: Charge Nurse/RN  Name of Charge Nurse/RN Notified Kennieth Francois RN  Date Charge Nurse/RN Notified 08/14/19  Time Charge Nurse/RN Notified 0539  Notify: Provider  Provider Name/Title Dr. Elsie Lincoln  Date Provider Notified 08/14/19  Time Provider Notified 0530  Notification Type Call  Notification Reason Change in status  Response See new orders  Date of Provider Response 08/14/19  Time of Provider Response 0540  Notify: Rapid Response  Name of Rapid Response RN Notified David RN  Date Rapid Response Notified 08/14/19  Time Rapid Response Notified 0530  Document  Patient Outcome Transferred/level of care increased  Progress note created (see row info) Yes  Lenord Carbo Pa at bedside.

## 2019-08-14 NOTE — Significant Event (Signed)
Rapid Response Event Note  Overview: Acute respiratory distress  Initial Focused Assessment: I was notified by nursing staff of pt being placed on "PRN BIPAP" for tachypnea and Red MEWS. Upon arrival, pt has dazed look on her face, responds to physical stimuli, is tachycardic, tachypneic and diaphoretic. Dr. Patsey Berthold and L. Gleason at bedside ordering transfer back to ICU for intubation. [facility] John notified and ICU bed arranged. Plan to intubate upon arrival to ICU.   0611- HR 137 ST, 144/80 (99), RR 36, sats 96% on 18/9 at 60%  Interventions: -BIPAP -Lasix -tx ICU    Event Summary: Call received 0526 Arrived at call 0530 Call ended [phone] Chelsea Allison

## 2019-08-14 NOTE — Progress Notes (Signed)
Advanced ET tube 2 mc from 24 to 26 per order from Collier Bullock, MD

## 2019-08-14 NOTE — Progress Notes (Signed)
PCCM to [facility] Transfer cancellation note.  Patient under the care of PCCM service.  Admitted for CAP/COPD exacerbation with acute hypoxic respiratory failure who required intubation and mechanical ventilation.  Extubated and transferred out of the ICU on 6/6 with plans for [facility] to take over care on 6/7.  As per chart review, patient decompensated overnight at around 4 AM on 6/7 and became acutely tachypneic and distressed.  She was transferred back to the ICU on 6/7 and reintubated.  Remains on PCCM service.  I have not participated in the care of this patient.  [facility] will sign off at this time. Kindly consult [facility] again for further assistance.  I have discussed with PCCM/Rahul Shearon Stalls, APP.  Vernell Leep, MD, [place], [facility]. [facility]  To contact the attending provider between 7A-7P or the covering provider during after hours 7P-7A, please log into the web site [email or web address] and access using universal [place] password for that web site. If you do not have the password, please call the hospital operator.

## 2019-08-15 ENCOUNTER — Inpatient Hospital Stay (HOSPITAL_COMMUNITY): Payer: Medicare HMO

## 2019-08-15 DIAGNOSIS — N179 Acute kidney failure, unspecified: Secondary | ICD-10-CM

## 2019-08-15 LAB — POCT I-STAT 7, (LYTES, BLD GAS, ICA,H+H)
Acid-Base Excess: 3 mmol/L — ABNORMAL HIGH (ref 0.0–2.0)
Acid-base deficit: 1 mmol/L (ref 0.0–2.0)
Bicarbonate: 26 mmol/L (ref 20.0–28.0)
Bicarbonate: 26.3 mmol/L (ref 20.0–28.0)
Calcium, Ion: 1.31 mmol/L (ref 1.15–1.40)
Calcium, Ion: 1.24 mmol/L (ref 1.15–1.40)
HCT: 38 % (ref 36.0–46.0)
HCT: 34 % — ABNORMAL LOW (ref 36.0–46.0)
Hemoglobin: 12.9 g/dL (ref 12.0–15.0)
Hemoglobin: 11.6 g/dL — ABNORMAL LOW (ref 12.0–15.0)
O2 Saturation: 88 %
O2 Saturation: 100 %
Patient temperature: 98.7
Patient temperature: 98.4
Potassium: 4.9 mmol/L (ref 3.5–5.1)
Potassium: 4.5 mmol/L (ref 3.5–5.1)
Sodium: 145 mmol/L (ref 135–145)
Sodium: 145 mmol/L (ref 135–145)
TCO2: 28 mmol/L (ref 22–32)
TCO2: 27 mmol/L (ref 22–32)
pCO2 arterial: 55.1 mmHg — ABNORMAL HIGH (ref 32.0–48.0)
pCO2 arterial: 34 mmHg (ref 32.0–48.0)
pH, Arterial: 7.283 — ABNORMAL LOW (ref 7.350–7.450)
pH, Arterial: 7.496 — ABNORMAL HIGH (ref 7.350–7.450)
pO2, Arterial: 63 mmHg — ABNORMAL LOW (ref 83.0–108.0)
pO2, Arterial: 202 mmHg — ABNORMAL HIGH (ref 83.0–108.0)

## 2019-08-15 LAB — CBC
HCT: 35.1 % — ABNORMAL LOW (ref 36.0–46.0)
Hemoglobin: 10.7 g/dL — ABNORMAL LOW (ref 12.0–15.0)
MCH: 26 pg (ref 26.0–34.0)
MCHC: 30.5 g/dL (ref 30.0–36.0)
MCV: 85.2 fL (ref 80.0–100.0)
Platelets: 247 10*3/uL (ref 150–400)
RBC: 4.12 MIL/uL (ref 3.87–5.11)
RDW: 15.6 % — ABNORMAL HIGH (ref 11.5–15.5)
WBC: 11 10*3/uL — ABNORMAL HIGH (ref 4.0–10.5)
nRBC: 0 % (ref 0.0–0.2)

## 2019-08-15 LAB — BASIC METABOLIC PANEL
Anion gap: 14 (ref 5–15)
BUN: 47 mg/dL — ABNORMAL HIGH (ref 8–23)
CO2: 22 mmol/L (ref 22–32)
Calcium: 9.4 mg/dL (ref 8.9–10.3)
Chloride: 109 mmol/L (ref 98–111)
Creatinine, Ser: 1.12 mg/dL — ABNORMAL HIGH (ref 0.44–1.00)
GFR calc Af Amer: >60 mL/min (ref >60)
GFR calc non Af Amer: 52 mL/min — ABNORMAL LOW (ref >60)
Glucose, Bld: 133 mg/dL — ABNORMAL HIGH (ref 70–99)
Potassium: 4 mmol/L (ref 3.5–5.1)
Sodium: 145 mmol/L (ref 135–145)

## 2019-08-15 LAB — CREATININE, URINE, RANDOM: Creatinine, Urine: 109.04 mg/dL

## 2019-08-15 LAB — CULTURE, RESPIRATORY W GRAM STAIN: Culture: NORMAL

## 2019-08-15 LAB — GLUCOSE, CAPILLARY
Glucose-Capillary: 127 mg/dL — ABNORMAL HIGH (ref 70–99)
Glucose-Capillary: 134 mg/dL — ABNORMAL HIGH (ref 70–99)
Glucose-Capillary: 131 mg/dL — ABNORMAL HIGH (ref 70–99)
Glucose-Capillary: 134 mg/dL — ABNORMAL HIGH (ref 70–99)
Glucose-Capillary: 119 mg/dL — ABNORMAL HIGH (ref 70–99)
Glucose-Capillary: 198 mg/dL — ABNORMAL HIGH (ref 70–99)

## 2019-08-15 LAB — UREA NITROGEN, URINE: Urea Nitrogen, Ur: 247 mg/dL

## 2019-08-15 LAB — TROPONIN I (HIGH SENSITIVITY): Troponin I (High Sensitivity): 12723 ng/L (ref <18)

## 2019-08-15 MED ORDER — METHYLPREDNISOLONE SODIUM SUCC 125 MG IJ SOLR
60.0000 mg | Freq: Four times a day (QID) | INTRAMUSCULAR | Status: DC
  Administered 2019-08-15 – 2019-08-18 (×11): 60 mg via INTRAVENOUS
  Filled 2019-08-15 (×11): qty 2

## 2019-08-15 MED ORDER — MIDAZOLAM HCL 2 MG/2ML IJ SOLN
INTRAMUSCULAR | Status: AC
  Administered 2019-08-15: 2 mg
  Filled 2019-08-15: qty 2

## 2019-08-15 MED ORDER — FENTANYL CITRATE (PF) 100 MCG/2ML IJ SOLN: 100.0000 ug | Freq: Once | INTRAMUSCULAR | Status: DC

## 2019-08-15 MED ORDER — ETOMIDATE 2 MG/ML IV SOLN
20.0000 mg | Freq: Once | INTRAVENOUS | Status: AC
  Administered 2019-08-15: 20 mg via INTRAVENOUS

## 2019-08-15 MED ORDER — FUROSEMIDE 10 MG/ML IJ SOLN
INTRAMUSCULAR | Status: AC
  Administered 2019-08-15: 40 mg via INTRAVENOUS
  Filled 2019-08-15: qty 4

## 2019-08-15 MED ORDER — FUROSEMIDE 10 MG/ML IJ SOLN
40.0000 mg | Freq: Two times a day (BID) | INTRAMUSCULAR | Status: DC
  Administered 2019-08-16 – 2019-08-20 (×9): 40 mg via INTRAVENOUS
  Filled 2019-08-15 (×10): qty 4

## 2019-08-15 MED ORDER — FENTANYL CITRATE (PF) 100 MCG/2ML IJ SOLN
INTRAMUSCULAR | Status: AC
  Administered 2019-08-15: 100 ug
  Filled 2019-08-15: qty 2

## 2019-08-15 MED ORDER — ALPRAZOLAM 0.25 MG PO TABS
0.2500 mg | ORAL_TABLET | Freq: Two times a day (BID) | ORAL | Status: DC | PRN
  Administered 2019-08-15: 0.25 mg via ORAL
  Filled 2019-08-15: qty 1

## 2019-08-15 MED ORDER — METHYLPREDNISOLONE SODIUM SUCC 125 MG IJ SOLR
125.0000 mg | Freq: Once | INTRAMUSCULAR | Status: AC
  Administered 2019-08-15: 125 mg via INTRAVENOUS

## 2019-08-15 MED ORDER — MIDAZOLAM HCL 2 MG/2ML IJ SOLN: 2.0000 mg | Freq: Once | INTRAMUSCULAR | Status: DC

## 2019-08-15 NOTE — Progress Notes (Signed)
[place] Progress Note Patient Name: Chelsea Allison DOB: [birth year] MRN: [id number]   Date of Service  08/15/2019  HPI/Events of Note  ABG on 60%/PRVC 28/TV 520/P 5 = 7.283/55.1/63.  eICU Interventions  Plan: 1. Increased PRVC rate to 34. 2. Repeat ABG at 9:30 PM.     Intervention Category Major Interventions: Acid-Base disturbance - evaluation and management;Respiratory failure - evaluation and management  Tinisha Etzkorn Eugene 08/15/2019, 8:30 PM

## 2019-08-15 NOTE — Progress Notes (Addendum)
NAME:  Chelsea Allison, MRN:  [id number], DOB:  08-31-1955, LOS: 4 ADMISSION DATE:  08/10/2019, CONSULTATION DATE:  08/12/2019 REFERRING MD:  Dr. Posey Pronto, CHIEF COMPLAINT:  Respiratory failure    Brief History   64yo f with a PMHx of CAD/STEMI in Oct. 2020 (s/p PCI), COPD on home O2, NSCLC s/p partial lung resection, CHF presented with shortness of breath and elevated troponin. Negative cardiac cath and treated for acute COPD exasperation with improvement. Admitted to ICU on 6/5 and transferred out following improvement. Decompensated with acute mental status change and respiratory distress on 6/7. Reintubated and transferred to ICU.  Past Medical History  Tobacco abuse NSTEMI 12/12/2018 status post PCI Neuropathy Type 2 diabetes Ischemic cardiomyopathy Hypertension Hyperlipidemia COPD Colon cancer Lung cancer CAD with drug-eluting stent times 09 December 2018  [facility] Events   6/4 - admitted 6/5 - Pt became unresponsive, obtunded, in significant respiratory distress. Transferred to ICU and intubated.  6/6 - Extubated and transferred out of ICU.  6/7 - Pt in distress and acutely tachypneic, found to be in hypercarbic resp failure. Reintubated and readmitted to ICU 6/8 - clinically improving, weaning ventilator  Consults:  PCCM  Procedures:  6/5 - Intubation (extubated 6/6) 6/7 - Re-intubated  Significant Diagnostic Tests:  CTA chest 6/4> No central, segmental, or subsegmental pulmonary embolism. Mild ground-glass tree-in-bud opacities seen throughout both lungs (infectious vs. Inflammatory). Stable ill-defined pulmonary nodules in the posterior left lower lobe and right middle lobe.  Chest x-ray 6/5 > ETT and NGT in place. Developing LEFT perihilar airspace filling opacities consistent with infiltrate or edema.  Head CT 6/5 > No evidence for acute intracranial abnormality.  Echo 6/5 > estimated LVEF 35-40% with mild concentric LVH.   Micro Data:  Covid 6/4 >  negative Respiratory virus panel 6/4 >  Blood culture 6/4 > no growth 3 days MRSA PCR 6/4> negative Respiratory culture 6/5 > few Staph aureus  Antimicrobials:  Azithromycin 6/4  Ceftriaxone 6/4 > 6/5. Restarted 6/6 Cefepime 6/5 > 6/6 Vancomycin 6/5 > 6/6  Interim history/subjective:  Pt is awake and using her phone. Intubated. Weaned to CPAP/PSV with 5/5 and FiO2 40. She indicates that she is doing OK and has some pain in her chest that is the same as before. Denies abdominal tenderness.  Objective   Blood pressure 90/73, pulse 72, temperature 97.7 F (36.5 C), temperature source Oral, resp. rate 20, height 5\' 9"  (1.753 m), weight 85.6 kg, SpO2 97 %.    Vent Mode: PRVC FiO2 (%):  [40 %-60 %] 40 % Set Rate:  [28 bmp] 28 bmp Vt Set:  [520 mL] 520 mL PEEP:  [5 cmH20] 5 cmH20 Plateau Pressure:  [21 cmH20-24 cmH20] 22 cmH20   Intake/Output Summary (Last 24 hours) at 08/15/2019 0918 Last data filed at 08/15/2019 0700 Gross per 24 hour  Intake 1544.39 ml  Output 950 ml  Net 594.39 ml   Filed Weights   08/12/19 0439 08/14/19 0432 08/15/19 0500  Weight: 83.8 kg 85.2 kg 85.6 kg   General:  Elderly female, resting in bed, intubated, no acute in respiratory distress HEENT: [place]/AT, mucous membranes pink/moist, ETT in place CV: RRR, no m/r/g PULM:  Expiratory wheezing on L side with rhonchi, R lung clear. GI: soft, non-distended, active bowel sounds Extremities: warm/dry, no edema  Skin: no rashes or lesions Neuro: Sedated  Labs: WBC 11<--27.9 Troponin 12723 <--18379 (6/7) Glucose 116-142  Resolved Hospital Problem list     Assessment & Plan:  Acute on chronic hypoxic and hypercapnic respiratory failure Admitted 6/4 with CAP and COPD exacerbation and intubated. Treated and extubated with sudden worsening on 6/7, requiring transfer back to the ICU and re-intubation. ABG improved to pH 7.3 (from 7.04) following intubation on 6/7. Now weaning to PS. - Intubated and weaning.  Titrate vent setting to maintain SpO2 greater than or equal to 90%. - Brovana, Pulmicort - Ceftriaxone, azithromycin - Solumedrol 60mg  BID - Follow CXR, ABG as needed - HOB elevated 30 degrees  CAD s/p PCIx2 in 2020, HFrEF Cardiac cath this admission with chronic LAD blockage, no significant new lesions. EF worsened to 30-35% from 45-50% 6 months ago.  - Hold Lasix given clinically appropriate volume status  - Continue Brillinta, statin, Asa - Cardiology following, appreciate recs - Strict I/Os  History of essential hypertension Home medications include Lasix, Cozaar, metoprolol -Holding Cozaar and metoprolol in the setting of borderline BP after extubation  Acute Kidney Injury  - Will check FEUrea to determine prerenal vs. Intrinsic cause - Follow renal indices  Type 2 DM. Elevated BG in the setting of steroid use, now improving. - SSI q4hrs - Levemir 5u BID  Best practice:  Diet: NPO Pain/Anxiety/Delirium protocol (if indicated):Propofol/Fentanyl VAP protocol (if indicated): HOB 30 degrees, suction prn  DVT prophylaxis: lovenox GI prophylaxis: PPI Glucose control: Sliding scale insulin, Levemir BID Mobility: Bedrest Code Status: Full code Family Communication: Updated pt's uncle Disposition: ICU   Critical care time:     [name], MS4  Attending note: I have seen and examined the patient. History, labs and imaging reviewed.  64 Y/O with CAD, COPD, acute on chronic hypoxic hypercarbic respiratory failure Extubated yesterday and transfer out of ICU.  Decompensated 6/7 and was reintubated More awake today.  ABG better On pressure support weans.  Blood pressure (!) 89/53, pulse 85, temperature 98.2 F (36.8 C), temperature source Oral, resp. rate 14, height 5\' 9"  (1.753 m), weight 85.6 kg, SpO2 99 %. Gen:      No acute distress HEENT:  EOMI, sclera anicteric, ET tube Neck:     No masses; no thyromegaly Lungs:    Clear to auscultation bilaterally; normal  respiratory effort CV:         Regular rate and rhythm; no murmurs Abd:      + bowel sounds; soft, non-tender; no palpable masses, no distension Ext:    No edema; adequate peripheral perfusion Skin:      Warm and dry; no rash Neuro: Awake, responsive  Labs/Imaging personally reviewed, significant for Troponin improving to 12 K, glucose 352 WBC 11, hemoglobin 10.7, platelets 247 No new imaging  Assessment/plan: Acute on chronic hypoxic, hypercarbic respiratory failure COPD exacerbation Pressure support weans as tolerated. Continue Brovana, Pulmicort, steroids Ceftriaxone, azithromycin  Hyper glycemia secondary to steroids Continue Levemir  Coronary artery disease, hypertension S/p cardiac cath on 6/4 with patent stents Hold blood pressure medication Continue brilinta, asa, statin Diuresis as tolerated  The patient is critically ill with multiple organ systems failure and requires high complexity decision making for assessment and support, frequent evaluation and titration of therapies, application of advanced monitoring technologies and extensive interpretation of multiple databases.  Critical care time - 35 mins. This represents my time independent of the NPs time taking care of the pt.  Marshell Garfinkel MD  Pulmonary and Critical Care 08/15/2019, 1:49 PM

## 2019-08-15 NOTE — Progress Notes (Signed)
ABG  Ph 7.07 pCo2 > 97 PO 128  Results called to Dr  Marshell Garfinkel, MD. Order to change BIPAP to 20/5.

## 2019-08-15 NOTE — Procedures (Signed)
Extubation Procedure Note  Patient Details:   Name: Chelsea Allison DOB: 02/27/1956 MRN: [id number]   Airway Documentation:    Vent end date: 08/15/19 Vent end time: 1602   Evaluation  O2 sats: stable throughout Complications: No apparent complications Patient did tolerate procedure well. Bilateral Breath Sounds: Diminished  Patient able to speak  Yes  Rudene Re 08/15/2019, 4:04 PM

## 2019-08-15 NOTE — Progress Notes (Addendum)
PCCM progress note  Pt has become more dyspneic and tachypnic since extubation.  Marked wheezing on auscultation CXR with mild interstitial infiltrates  Will get ABG Lasix 40 mg IV once Solumedrol 125 mg IV once. Increase solumedrol IV dose to 60 mg q6 Nebs Starting Bipap  Will sign out to [facility] to check on the patient.  The patient is critically ill with multiple organ system failure and requires high complexity decision making for assessment and support, frequent evaluation and titration of therapies, advanced monitoring, review of radiographic studies and interpretation of complex data.   Critical Care Time devoted to patient care services, exclusive of separately billable procedures, described in this note is 35 minutes.   Marshell Garfinkel MD [place] [facility] Please see [email or web address] for pager details.  08/15/2019, 6:10 PM

## 2019-08-15 NOTE — Procedures (Signed)
Intubation Procedure Note Sheleen Conchas [id number] 07-07-1955  Procedure: Intubation Indications: Respiratory insufficiency  Procedure Details Consent: Unable to obtain consent because of emergent medical necessity. Time Out: Verified patient identification, verified procedure, site/side was marked, verified correct patient position, special equipment/implants available, medications/allergies/relevent history reviewed, required imaging and test results available.  Performed  Maximum sterile technique was used including gown, hand hygiene, mask and sheet.  Glidescope blade 3, 1 attempt Grade 1 view  Evaluation Hemodynamic Status: BP stable throughout; O2 sats: stable throughout Patient's Current Condition: stable Complications: No apparent complications Patient did tolerate procedure well. Chest X-ray ordered to verify placement.  CXR: pending.  Marshell Garfinkel MD [place] [facility] Please see [email or web address] for pager details.  08/15/2019, 7:07 PM

## 2019-08-15 NOTE — Procedures (Addendum)
Bronchoscopy Procedure Note Crysta Gulick [id number] Jun 05, 1955  Procedure: Bronchoscopy Indications: Diagnostic evaluation of the airways and Obtain specimens for culture and/or other diagnostic studies  Procedure Details Consent: Unable to obtain consent because of emergent medical necessity. Time Out: Verified patient identification, verified procedure, site/side was marked, verified correct patient position, special equipment/implants available, medications/allergies/relevent history reviewed, required imaging and test results available.  Performed  In preparation for procedure, patient was given 100% FiO2 and bronchoscope lubricated. Sedation: Etomidate  Airway entered and the following bronchi were examined: RUL, RML, RLL, LUL, LLL and Bronchi.   Thin mucopurulent secretions noted with possible tracheobronchomalacia with patient was coughing which may have clouded the picture. No mucus plugs. BAL performed in LLL  She will need a repeat bronchoscopy at a later point after adequate sedation to confirm tracheobronchomalacia.  Evaluation Hemodynamic Status: BP stable throughout; O2 sats: stable throughout Patient's Current Condition: stable Complications: No apparent complications Patient did tolerate procedure well.  Marshell Garfinkel MD [place] [facility] Please see [email or web address] for pager details.  08/15/2019, 7:19 PM

## 2019-08-16 ENCOUNTER — Inpatient Hospital Stay (HOSPITAL_COMMUNITY): Payer: Medicare HMO

## 2019-08-16 LAB — CBC
HCT: 37.6 % (ref 36.0–46.0)
Hemoglobin: 11.3 g/dL — ABNORMAL LOW (ref 12.0–15.0)
MCH: 26 pg (ref 26.0–34.0)
MCHC: 30.1 g/dL (ref 30.0–36.0)
MCV: 86.4 fL (ref 80.0–100.0)
Platelets: 228 10*3/uL (ref 150–400)
RBC: 4.35 MIL/uL (ref 3.87–5.11)
RDW: 15.9 % — ABNORMAL HIGH (ref 11.5–15.5)
WBC: 10.4 10*3/uL (ref 4.0–10.5)
nRBC: 0 % (ref 0.0–0.2)

## 2019-08-16 LAB — CULTURE, BLOOD (ROUTINE X 2)
Culture: NO GROWTH
Culture: NO GROWTH
Special Requests: ADEQUATE

## 2019-08-16 LAB — POCT I-STAT 7, (LYTES, BLD GAS, ICA,H+H)
Acid-Base Excess: 4 mmol/L — ABNORMAL HIGH (ref 0.0–2.0)
Bicarbonate: 25.7 mmol/L (ref 20.0–28.0)
Calcium, Ion: 1.25 mmol/L (ref 1.15–1.40)
HCT: 35 % — ABNORMAL LOW (ref 36.0–46.0)
Hemoglobin: 11.9 g/dL — ABNORMAL LOW (ref 12.0–15.0)
O2 Saturation: 100 %
Patient temperature: 98.4
Potassium: 3.9 mmol/L (ref 3.5–5.1)
Sodium: 144 mmol/L (ref 135–145)
TCO2: 27 mmol/L (ref 22–32)
pCO2 arterial: 29.3 mmHg — ABNORMAL LOW (ref 32.0–48.0)
pH, Arterial: 7.551 — ABNORMAL HIGH (ref 7.350–7.450)
pO2, Arterial: 152 mmHg — ABNORMAL HIGH (ref 83.0–108.0)

## 2019-08-16 LAB — BASIC METABOLIC PANEL
Anion gap: 13 (ref 5–15)
BUN: 52 mg/dL — ABNORMAL HIGH (ref 8–23)
CO2: 23 mmol/L (ref 22–32)
Calcium: 9.3 mg/dL (ref 8.9–10.3)
Chloride: 110 mmol/L (ref 98–111)
Creatinine, Ser: 1.18 mg/dL — ABNORMAL HIGH (ref 0.44–1.00)
GFR calc Af Amer: 57 mL/min — ABNORMAL LOW (ref >60)
GFR calc non Af Amer: 49 mL/min — ABNORMAL LOW (ref >60)
Glucose, Bld: 140 mg/dL — ABNORMAL HIGH (ref 70–99)
Potassium: 4.1 mmol/L (ref 3.5–5.1)
Sodium: 146 mmol/L — ABNORMAL HIGH (ref 135–145)

## 2019-08-16 LAB — MAGNESIUM: Magnesium: 2.7 mg/dL — ABNORMAL HIGH (ref 1.7–2.4)

## 2019-08-16 LAB — GLUCOSE, CAPILLARY
Glucose-Capillary: 121 mg/dL — ABNORMAL HIGH (ref 70–99)
Glucose-Capillary: 125 mg/dL — ABNORMAL HIGH (ref 70–99)
Glucose-Capillary: 134 mg/dL — ABNORMAL HIGH (ref 70–99)
Glucose-Capillary: 134 mg/dL — ABNORMAL HIGH (ref 70–99)
Glucose-Capillary: 144 mg/dL — ABNORMAL HIGH (ref 70–99)
Glucose-Capillary: 175 mg/dL — ABNORMAL HIGH (ref 70–99)
Glucose-Capillary: 178 mg/dL — ABNORMAL HIGH (ref 70–99)

## 2019-08-16 LAB — UREA NITROGEN, URINE: Urea Nitrogen, Ur: 962 mg/dL

## 2019-08-16 LAB — PHOSPHORUS: Phosphorus: 1.6 mg/dL — ABNORMAL LOW (ref 2.5–4.6)

## 2019-08-16 MED ORDER — PRO-STAT SUGAR FREE PO LIQD: 30.0000 mL | Freq: Two times a day (BID) | ORAL | Status: DC

## 2019-08-16 MED ORDER — VITAL AF 1.2 CAL PO LIQD
1000.0000 mL | ORAL | Status: DC
  Administered 2019-08-16 – 2019-08-19 (×9): 1000 mL

## 2019-08-16 NOTE — Progress Notes (Addendum)
NAME:  Chelsea Allison, MRN:  [id number], DOB:  April 15, 1955, LOS: 5 ADMISSION DATE:  08/10/2019, CONSULTATION DATE:  08/12/2019 REFERRING MD:  Dr. Posey Pronto, CHIEF COMPLAINT:  Respiratory failure    Brief History   64yo f with a PMHx of CAD/STEMI in Oct. 2020 (s/p PCI), COPD on home O2, NSCLC s/p partial lung resection, CHF presented with shortness of breath and elevated troponin. Negative cardiac cath and treated for acute COPD exasperation with improvement. Admitted to ICU on 6/5 and transferred out following improvement. Decompensated with acute mental status change and respiratory distress on 6/7. Reintubated and transferred to ICU.  Past Medical History  Tobacco abuse NSTEMI 12/12/2018 status post PCI Neuropathy Type 2 diabetes Ischemic cardiomyopathy Hypertension Hyperlipidemia COPD Colon cancer Lung cancer CAD with drug-eluting stent times 09 December 2018  [facility] Events   6/4 - admitted 6/5 - Pt became unresponsive, obtunded, in significant respiratory distress. Transferred to ICU and intubated.  6/6 - Extubated and transferred out of ICU.  6/7 - Pt in distress and acutely tachypneic, found to be in hypercarbic resp failure. Reintubated and readmitted to ICU 6/8 - clinically improving, weaning ventilator. Extubated, developed worsening wheezing, respiratory distress, and acidosis. Started on BiPAP and later intubated.   Consults:  PCCM  Procedures:  6/5 - Intubation (extubated 6/6) 6/7 - Re-intubated (extubated 6/8) 6/8 - Re-intubated  Significant Diagnostic Tests:  CTA chest 6/4> No central, segmental, or subsegmental pulmonary embolism. Mild ground-glass tree-in-bud opacities seen throughout both lungs (infectious vs. Inflammatory). Stable ill-defined pulmonary nodules in the posterior left lower lobe and right middle lobe.  Chest x-ray 6/5 > ETT and NGT in place. Developing LEFT perihilar airspace filling opacities consistent with infiltrate or edema.  Head CT  6/5 > No evidence for acute intracranial abnormality.  Echo 6/5 > estimated LVEF 35-40% with mild concentric LVH.   Bronchoscopy 6/8 >  Micro Data:  Covid 6/4 > negative Respiratory virus panel 6/4 >  Blood culture 6/4 > no growth 3 days MRSA PCR 6/4> negative Respiratory culture 6/5 > few Staph aureus  Antimicrobials:  Azithromycin 6/4  Ceftriaxone 6/4 > 6/5. Restarted 6/6 Cefepime 6/5 > 6/6 Vancomycin 6/5 > 6/6  Interim history/subjective:  Pt was re-intubated last night following respiratory distress and worsening acidosis. ABG after re-intubation showed improvement to pH 7.283 (from 7.07 with CO2 >97). Repeat ABG showed further improvement to 7.496. Pt is currently intubated on PRVC.  She is awake and communicating. Reports soreness at and around site of ETT. Denies other pain or discomfort.  Objective   Blood pressure 102/71, pulse 67, temperature 97.6 F (36.4 C), resp. rate (!) 34, height _0  (1.753 m), weight 86.9 kg, SpO2 98 %.    Vent Mode: PRVC FiO2 (%):  [40 %-60 %] 40 % Set Rate:  [28 bmp-34 bmp] 34 bmp Vt Set:  [520 mL] 520 mL PEEP:  [5 cmH20] 5 cmH20 Pressure Support:  [5 cmH20-8 cmH20] 5 cmH20 Plateau Pressure:  [14 cmH20-30 cmH20] 26 cmH20   Intake/Output Summary (Last 24 hours) at 08/16/2019 7591 Last data filed at 08/16/2019 0800 Gross per 24 hour  Intake 737.66 ml  Output 750 ml  Net -12.34 ml   Filed Weights   08/14/19 0432 08/15/19 0500 08/16/19 0417  Weight: 85.2 kg 85.6 kg 86.9 kg   General:  Elderly female, resting in bed, intubated, no acute in respiratory distress HEENT: [place]/AT, mucous membranes pink/moist, ETT in place CV: RRR, no m/r/g PULM:  Expiratory wheezing on L  side with rhonchi, R lung clear. GI: soft, non-distended, active bowel sounds Extremities: warm/dry, no edema  Skin: no rashes or lesions Neuro: Sedated  Labs: WBC 10.4  BUN 52, Cr 1.18 (<--1.12) Urine BUN 247 (6/7) Urine Cr 109.04 Glucose 116-142  Resolved  Hospital Problem list     Assessment & Plan:   Acute on chronic hypoxic and hypercapnic respiratory failure Admitted 6/4 with CAP and COPD exacerbation and intubated. Treated and extubated with sudden worsening on 6/7, requiring transfer back to the ICU and re-intubation. Pt extubated 6/8 and reintubated again due to acidosis and respiratory distress. Bronchoscopy showed tracheobronchomalacia. - Reintubated. Titrate vent setting to maintain SpO2 greater than or equal to 90%. - Brovana, Pulmicort - Ceftriaxone, azithromycin - Solumedrol 13m q6h - Follow CXR as needed - HOB elevated 30 degrees - Will repeat bronchoscopy today or tomorrow  CAD s/p PCIx2 in 2020, HFrEF Cardiac cath this admission with chronic LAD blockage, no significant new lesions. EF worsened to 30-35% from 45-50% 6 months ago.  - Lasix 40 q12h  - Continue Brillinta, statin, Asa - Cardiology following, appreciate recs - Strict I/Os  History of essential hypertension Home medications include Lasix, Cozaar, metoprolol -Holding Cozaar and metoprolol in the setting of borderline BP after extubation  Acute Kidney Injury  FEUrea 5.1% suggesting prerenal disease. - Follow renal indices  Type 2 DM. Elevated BG in the setting of steroid use, now improving. - SSI q4hrs - Levemir 5u BID  Best practice:  Diet: NPO Pain/Anxiety/Delirium protocol (if indicated):Propofol/Fentanyl VAP protocol (if indicated): HOB 30 degrees, suction prn  DVT prophylaxis: lovenox GI prophylaxis: PPI Glucose control: Sliding scale insulin, Levemir BID Mobility: Bedrest Code Status: Full code Family Communication: Updated pt's uncle Disposition: ICU   Critical care time:     NAcquanetta Sit MS4  Attending note: I have seen and examined the patient. History, labs and imaging reviewed.  64Y/O with CAD, COPD, acute on chronic hypoxic hypercarbic respiratory failure Re intubated yesterday for hypercarbia  Blood pressure (!) 143/92,  pulse 92, temperature 97.6 F (36.4 C), resp. rate (!) 29, height _0  (1.753 m), weight 86.9 kg, SpO2 99 %. Gen:      No acute distress HEENT:  EOMI, sclera anicteric, ETT Neck:     No masses; no thyromegaly Lungs:   B/L wheeze CV:         Regular rate and rhythm; no murmurs Abd:      + bowel sounds; soft, non-tender; no palpable masses, no distension Ext:    No edema; adequate peripheral perfusion Skin:      Warm and dry; no rash Neuro: Awake  Labs/Imaging personally reviewed, significant for Sodium 146, BUN/creatinine 52/1.18, WBC 10.4, platelets 228 Chest x-ray-stable support apparatus and postradiation changes in the right hemithorax.  Assessment/plan: Acute on chronic hypoxic, hypercarbic respiratory failure COPD exacerbation Has persistent wheezing with tracheomalacia noted on bronchoscopy Continue ceftriaxone, azithromycin, nebulizers, steroids Follow BAL cultures  AKI Appears dehydrated based on FeUrea and BUN/Cr ratio Holding further diuresis.  Follow labs  Coronary artery disease, hypertension S/p cardiac cath on 6/4 with patent stents Hold blood pressure medication Continue brilinta, asa, statin Diuresis as tolerated  The patient is critically ill with multiple organ systems failure and requires high complexity decision making for assessment and support, frequent evaluation and titration of therapies, application of advanced monitoring technologies and extensive interpretation of multiple databases.  Critical care time - 35 mins. This represents my time independent of the NPs time taking  care of the pt.  Marshell Garfinkel MD [facility] 08/16/2019, 11:09 AM

## 2019-08-16 NOTE — Progress Notes (Signed)
Initial Nutrition Assessment  DOCUMENTATION CODES:   Not applicable  INTERVENTION:   Tube feeding:  -Vital AF 1.2 @ 20 ml/hr via OG -Increase by 10 ml Q4 hours to goal rate of 65 ml/hr (1560 ml)  At goal provides: 1872 kcals, 117 grams protein, 1265 ml free water.   Monitor magnesium, potassium, and phosphorus daily for at least 3 days, MD to replete as needed, as pt is at risk for refeeding syndrome.  NUTRITION DIAGNOSIS:   Increased nutrient needs related to acute illness as evidenced by estimated needs.  GOAL:   Patient will meet greater than or equal to 90% of their needs  MONITOR:   Weight trends, Labs, I & O's, Vent status, Skin, TF tolerance  REASON FOR ASSESSMENT:   Ventilator    ASSESSMENT:   Patient with PMH significant for CAD/STEMI, s/p PCI, COPD, HTN, HLD, CHF, non-small lung cancer stg III s/p partial resection, and anal cancer s/p colostomy. Presents this admission with COPD exacerbation.   6/5- intubation 6/6- extubation 6/7- re- intubation 6/8- extubation, re-intubation  Pt discussed during ICU rounds and with RN.   Bronch this am showed tracheobronchomalacia. Plan for repeat bronch tomorrow. Day 5 without nutrition due to multiple extubations/intubations. Okay to start feeding today per CCM.   Xray confirms OG in mid stomach.   Admission weight: 83.9 kg  Current weight:  86.9 kg   Patient is currently intubated on ventilator support MV: 15.8 L/min Temp (24hrs), Avg:98.2 F (36.8 C), Min:97.6 F (36.4 C), Max:98.4 F (36.9 C)   I/O: -2,353 ml since admit UOP: 750 ml x 24 hrs   Medications: colace, 40 mg lasix BID, SS novolog, levemir, solumedrol, miralax, 20 mEq KCl daily Labs: Phosphorus 1.6 (L) Mg 2.7 (H)   NUTRITION - FOCUSED PHYSICAL EXAM:    Most Recent Value  Orbital Region  No depletion  Upper Arm Region  No depletion  Thoracic and Lumbar Region  Unable to assess  Buccal Region  No depletion  Temple Region  No depletion   Clavicle Bone Region  No depletion  Clavicle and Acromion Bone Region  No depletion  Scapular Bone Region  Unable to assess  Dorsal Hand  Unable to assess  Patellar Region  No depletion  Anterior Thigh Region  No depletion  Posterior Calf Region  No depletion  Edema (RD Assessment)  Mild  Hair  Reviewed  Eyes  Unable to assess  Mouth  Unable to assess  Skin  Reviewed  Nails  Unable to assess     Diet Order:   Diet Order            Diet NPO time specified  Diet effective now              EDUCATION NEEDS:   Not appropriate for education at this time  Skin:  Skin Assessment: Reviewed RN Assessment  Last BM:  6/6  Height:   Ht Readings from Last 1 Encounters:  08/12/19 5\' 9"  (1.753 m)    Weight:   Wt Readings from Last 1 Encounters:  08/16/19 86.9 kg    BMI:  Body mass index is 28.29 kg/m.  Estimated Nutritional Needs:   Kcal:  1842 kcal  Protein:  115-125 grams  Fluid:  >/= 1.8 L/day   Chelsea Allison RD, LDN Clinical Nutrition Pager listed in [place]

## 2019-08-17 LAB — GLUCOSE, CAPILLARY
Glucose-Capillary: 182 mg/dL — ABNORMAL HIGH (ref 70–99)
Glucose-Capillary: 162 mg/dL — ABNORMAL HIGH (ref 70–99)
Glucose-Capillary: 156 mg/dL — ABNORMAL HIGH (ref 70–99)
Glucose-Capillary: 187 mg/dL — ABNORMAL HIGH (ref 70–99)
Glucose-Capillary: 224 mg/dL — ABNORMAL HIGH (ref 70–99)

## 2019-08-17 LAB — TRIGLYCERIDES: Triglycerides: 248 mg/dL — ABNORMAL HIGH (ref <150)

## 2019-08-17 MED ORDER — ETOMIDATE 2 MG/ML IV SOLN
INTRAVENOUS | Status: AC
  Administered 2019-08-17: 20 mg
  Filled 2019-08-17: qty 10

## 2019-08-17 MED ORDER — ROCURONIUM BROMIDE 10 MG/ML (PF) SYRINGE
PREFILLED_SYRINGE | INTRAVENOUS | Status: AC
  Administered 2019-08-17: 50 mg
  Filled 2019-08-17: qty 10

## 2019-08-17 MED ORDER — VANCOMYCIN HCL 1500 MG/300ML IV SOLN
1500.0000 mg | INTRAVENOUS | Status: DC
  Administered 2019-08-17: 1500 mg via INTRAVENOUS
  Filled 2019-08-17: qty 300

## 2019-08-17 NOTE — Progress Notes (Signed)
Pharmacy Antibiotic Note  Chelsea Allison is a 65 y.o. female admitted on 08/10/2019 with enterococcus in bronchial washings  Scr 1.18 from 6/9 - has been on ceftriaxone azithro  Plan: vanc 1500 q24 h - est auc 537 Dc ceftriaxone F/u susceptibilities  Height: 5\' 9"  (175.3 cm) Weight: 83.3 kg (183 lb 10.3 oz) IBW/kg (Calculated) : 66.2  Temp (24hrs), Avg:98.4 F (36.9 C), Min:97.7 F (36.5 C), Max:99 F (37.2 C)  Recent Labs  Lab 08/12/19 0727 08/12/19 1419 08/13/19 0225 08/13/19 0815 08/14/19 0543 08/15/19 0503 08/15/19 1231 08/16/19 0222  WBC 19.5*  --  14.4*  --  27.9* 11.0*  --  10.4  CREATININE 1.11*  --  1.06*  --  1.28*  --  1.12* 1.18*  LATICACIDVEN 2.0* 2.8*  --  2.5*  --   --   --   --     Estimated Creatinine Clearance: 56.2 mL/min (A) (by C-G formula based on SCr of 1.18 mg/dL (H)).    Allergies  Allergen Reactions  . Statins Other (See Comments)  . Hydrochlorothiazide Other (See Comments)    headaches  . Hydrocodone Nausea And Vomiting  . Lisinopril Other (See Comments)    Headache   . Oxycodone Nausea Only   Barth Kirks, PharmD, BCPS, BCCCP Clinical Pharmacist [phone]  Please check AMION for all [facility] numbers  08/17/2019 4:40 PM

## 2019-08-17 NOTE — Progress Notes (Addendum)
NAME:  Chelsea Allison, MRN:  [id number], DOB:  [birth year], LOS: 6 ADMISSION DATE:  08/10/2019, CONSULTATION DATE:  08/12/2019 REFERRING MD:  Dr. Posey Pronto, CHIEF COMPLAINT:  Respiratory failure    Brief History   64yo f with a PMHx of CAD/STEMI in Oct. 2020 (s/p PCI), COPD on home O2, NSCLC s/p partial lung resection, CHF presented with shortness of breath and elevated troponin. Negative cardiac cath and treated for acute COPD exasperation with improvement. Admitted to ICU on 6/5 and transferred out following improvement. Decompensated with acute mental status change and respiratory distress on 6/7. Reintubated and transferred to ICU.  Past Medical History  Tobacco abuse NSTEMI 12/12/2018 status post PCI Neuropathy Type 2 diabetes Ischemic cardiomyopathy Hypertension Hyperlipidemia COPD Colon cancer Lung cancer CAD with drug-eluting stent times 09 December 2018  [facility] Events   6/4 - admitted 6/5 - Pt became unresponsive, obtunded, in significant respiratory distress. Transferred to ICU and intubated.  6/6 - Extubated and transferred out of ICU.  6/7 - Pt in distress and acutely tachypneic, found to be in hypercarbic resp failure. Reintubated and readmitted to ICU 6/8 - clinically improving, weaning ventilator. Extubated, developed worsening wheezing, respiratory distress, and acidosis. Started on BiPAP and later intubated.   Consults:  PCCM  Procedures:  6/5 - Intubation (extubated 6/6) 6/7 - Re-intubated (extubated 6/8) 6/8 - Re-intubated  Significant Diagnostic Tests:  CTA chest 6/4> No central, segmental, or subsegmental pulmonary embolism. Mild ground-glass tree-in-bud opacities seen throughout both lungs (infectious vs. Inflammatory). Stable ill-defined pulmonary nodules in the posterior left lower lobe and right middle lobe.  Chest x-ray 6/5 > ETT and NGT in place. Developing LEFT perihilar airspace filling opacities consistent with infiltrate or edema.  Head CT  6/5 > No evidence for acute intracranial abnormality.  Echo 6/5 > estimated LVEF 35-40% with mild concentric LVH.   Bronchoscopy 6/8 > tracheobronchomalacia, mucus plugs  Repeat bronchoscopy 6/10 > Significant airway distortion and stenosis of the R airways likely due to fibrosis s/p RUL lobectomy and radiation  Micro Data:  Covid 6/4 > negative Respiratory virus panel 6/4 >  Blood culture 6/4 > no growth 3 days MRSA PCR 6/4> negative Respiratory culture 6/5 > few Staph aureus  Antimicrobials:  Azithromycin 6/4  Ceftriaxone 6/4 > 6/5. Restarted 6/6 Cefepime 6/5 > 6/6 Vancomycin 6/5 > 6/6  Interim history/subjective:  NAEON. Pt is on PS wean at 5/5. She is doing well. Denies pain or discomfort.  Objective   Blood pressure 113/67, pulse 78, temperature 97.7 F (36.5 C), temperature source Oral, resp. rate 12, height _0  (1.753 m), weight 83.3 kg, SpO2 98 %.    Vent Mode: PSV;CPAP FiO2 (%):  [40 %] 40 % Set Rate:  [22 bmp] 22 bmp Vt Set:  [520 mL] 520 mL PEEP:  [5 cmH20] 5 cmH20 Pressure Support:  [5 cmH20] 5 cmH20 Plateau Pressure:  [20 cmH20-22 cmH20] 20 cmH20   Intake/Output Summary (Last 24 hours) at 08/17/2019 1028 Last data filed at 08/17/2019 0800 Gross per 24 hour  Intake 1009.26 ml  Output 1500 ml  Net -490.74 ml   Filed Weights   08/15/19 0500 08/16/19 0417 08/17/19 0555  Weight: 85.6 kg 86.9 kg 83.3 kg   General:  Elderly female, resting in bed, intubated, no acute in respiratory distress HEENT: [place]/AT, mucous membranes pink/moist, ETT in place CV: RRR, no m/r/g PULM:  Wheezing and rhonchi bilaterally, normal respiratory effort GI: soft, non-distended, active bowel sounds Extremities: warm/dry, no edema  Skin:  no rashes or lesions Neuro: [facility] Problem list     Assessment & Plan:   Acute on chronic hypoxic and hypercapnic respiratory failure Admitted 6/4 with CAP and COPD exacerbation and intubated. Treated and extubated with  sudden worsening on 6/7, requiring transfer back to the ICU and re-intubation. Pt extubated 6/8 and reintubated again due to acidosis and respiratory distress. Bronchoscopy on 6/8 showed tracheobronchomalacia. - Reintubated and weaning on PS. Titrate vent setting to maintain SpO2 greater than or equal to 90%. - Brovana, Pulmicort - Ceftriaxone, azithromycin - Solumedrol 13m q6h - Follow CXR as needed - HOB elevated 30 degrees - Repeat bronch done today  CAD s/p PCIx2 in 2020, HFrEF Cardiac cath this admission with chronic LAD blockage, no significant new lesions. EF worsened to 30-35% from 45-50% 6 months ago.  - Lasix 40 q12h  - Continue Brillinta, statin, Asa - Cardiology following, appreciate recs - Strict I/Os  History of essential hypertension Home medications include Lasix, Cozaar, metoprolol -Holding Cozaar and metoprolol in the setting of borderline BP after extubation  Acute Kidney Injury  FEUrea 5.1% suggesting prerenal disease. - Follow renal indices  Type 2 DM. Elevated BG in the setting of steroid use, now improving. - SSI q4hrs - Levemir 5u BID  Best practice:  Diet: NPO, TF started 6/9 Pain/Anxiety/Delirium protocol (if indicated):Propofol/Fentanyl VAP protocol (if indicated): HOB 30 degrees, suction prn  DVT prophylaxis: lovenox GI prophylaxis: PPI Glucose control: Sliding scale insulin, Levemir BID Mobility: Bedrest Code Status: Full code Family Communication: Updated pt's uncle Disposition: ICU   Critical care time:     NAcquanetta Sit MS4  Attending note: I have seen and examined the patient. History, labs and imaging reviewed.  64Y/O with CAD, COPD, acute on chronic hypoxic hypercarbic respiratory failure Re intubated for hypercarbia.  On pressure support weans but has mild wheezing bilaterally  Blood pressure (!) 137/92, pulse 90, temperature 98.1 F (36.7 C), resp. rate (!) 22, height _0  (1.753 m), weight 83.3 kg, SpO2 100 %. Gen:      No  acute distress HEENT:  EOMI, sclera anicteric Neck:     No masses; no thyromegaly, ET tube Lungs:    Clear to auscultation bilaterally; normal respiratory effort CV:         Regular rate and rhythm; no murmurs Abd:      + bowel sounds; soft, non-tender; no palpable masses, no distension Ext:    No edema; adequate peripheral perfusion Skin:      Warm and dry; no rash Neuro: Awake, responsive  Labs/Imaging personally reviewed, significant for Sodium 146, BUN/creatinine 52/1.18, WBC 10.4, hemoglobin 11.3 Chest x-ray 6/9-ET tube stable, postradiation changes in the right hemithorax  Assessment/plan: Acute on chronic hypoxic, hypercarbic respiratory failure COPD exacerbation Has persistent wheezing. Repeat bronchoscopy does not show significant tracheomalacia but she does have airway distortion from previous surgery, fibrosis Continue ceftriaxone, azithromycin, nebulizers, steroids Start vancomycin for Enterococcus in the BAL.  Narrow based on sensitivities.  AKI Creatinine is stable.  Coronary artery disease, hypertension S/p cardiac cath on 6/4 with patent stents Hold blood pressure medication Continue brilinta, asa, statin Diuresis as tolerated  The patient is critically ill with multiple organ systems failure and requires high complexity decision making for assessment and support, frequent evaluation and titration of therapies, application of advanced monitoring technologies and extensive interpretation of multiple databases.  Critical care time - 35 mins. This represents my time independent of the NPs time taking care of the  pt.  Marshell Garfinkel MD [facility] 08/17/2019, 4:23 PM

## 2019-08-17 NOTE — Procedures (Addendum)
Bronchoscopy Procedure Note Chelsea Allison [id number] February 28, 1956  Procedure: Bronchoscopy Indications: Diagnostic evaluation of the airways and Obtain specimens for culture and/or other diagnostic studies  Procedure Details Consent: Risks of procedure as well as the alternatives and risks of each were explained to the (patient/caregiver).  Consent for procedure obtained. Time Out: Verified patient identification, verified procedure, site/side was marked, verified correct patient position, special equipment/implants available, medications/allergies/relevent history reviewed, required imaging and test results available.  Performed  In preparation for procedure, patient was given 100% FiO2 and bronchoscope lubricated. Sedation: Benzodiazepines, Muscle relaxants and Etomidate  Airway entered and the following bronchi were examined: RUL, RML, RLL, LUL, LLL and Bronchi.   Thin mucus present in the airway.  Left lung anatomy was normal.  Carina, right upper lobe and bronchus intermedius openings narrowed with distortion [see pictures].  She likely has some airway narrowing caused by lung surgery, postradiation fibrosis that is contributing to wheezing and recurrent respiratory failure in the setting of COPD exacerbation.  No worrisome endobronchial lesions or tracheobronchomalacia noted.   Procedures performed: Bronchial washings in the right lower lobe.  Carina                          Right upper lobe         Bronchus intermedius       Evaluation Hemodynamic Status: BP stable throughout; O2 sats: stable throughout Patient's Current Condition: stable Patient did tolerate procedure well.  Chelsea Garfinkel MD [facility] Please see [email or web address] for pager details.  08/17/2019, 4:21 PM

## 2019-08-17 NOTE — Procedures (Signed)
Bedside Bronchoscopy Procedure Note Chelsea Allison [id number] March 02, 1956  Procedure: Bronchoscopy Indications: Obtain specimens for culture and/or other diagnostic studies  Procedure Details: ET Tube Size:7.5 ET Tube secured at lip (cm): 25 Bite block in place: No In preparation for procedure, Patient hyper-oxygenated with 100 % FiO2 Airway entered and the following bronchi were examined:  Bronchoscope removed.    Evaluation BP (!) 137/92   Pulse 90   Temp 98.1 F (36.7 C)   Resp (!) 22   Ht 5\' 9"  (1.753 m)   Wt 83.3 kg   SpO2 100%   BMI 27.12 kg/m  Breath Sounds:Diminished O2 sats: stable throughout Patient's Current Condition: stable Specimens:  Sent serosanguinous fluid Complications: No apparent complications Patient did tolerate procedure well.   Vilinda Blanks 08/17/2019, 4:23 PM

## 2019-08-17 NOTE — Progress Notes (Signed)
Brief Progress Note  Bronchial brushing culture showing +Enterococcus faecalis. Spoke with pharmacy and pt is not being covered for Enterococcus with current regimen (CTX, azithromycin).   Will start vancomycin. If susceptibilities show sensitivity to ampicillin, will switch to ampicillin.  [name], MSIV

## 2019-08-18 ENCOUNTER — Encounter (HOSPITAL_COMMUNITY): Payer: Self-pay | Admitting: Internal Medicine

## 2019-08-18 ENCOUNTER — Inpatient Hospital Stay (HOSPITAL_COMMUNITY): Payer: Medicare HMO

## 2019-08-18 DIAGNOSIS — J158 Pneumonia due to other specified bacteria: Secondary | ICD-10-CM

## 2019-08-18 DIAGNOSIS — J9621 Acute and chronic respiratory failure with hypoxia: Principal | ICD-10-CM

## 2019-08-18 LAB — CBC
HCT: 39.6 % (ref 36.0–46.0)
Hemoglobin: 11.8 g/dL — ABNORMAL LOW (ref 12.0–15.0)
MCH: 26.3 pg (ref 26.0–34.0)
MCHC: 29.8 g/dL — ABNORMAL LOW (ref 30.0–36.0)
MCV: 88.2 fL (ref 80.0–100.0)
Platelets: 220 10*3/uL (ref 150–400)
RBC: 4.49 MIL/uL (ref 3.87–5.11)
RDW: 16.2 % — ABNORMAL HIGH (ref 11.5–15.5)
WBC: 15 10*3/uL — ABNORMAL HIGH (ref 4.0–10.5)
nRBC: 0 % (ref 0.0–0.2)

## 2019-08-18 LAB — BASIC METABOLIC PANEL
Anion gap: 8 (ref 5–15)
BUN: 54 mg/dL — ABNORMAL HIGH (ref 8–23)
CO2: 24 mmol/L (ref 22–32)
Calcium: 8.9 mg/dL (ref 8.9–10.3)
Chloride: 112 mmol/L — ABNORMAL HIGH (ref 98–111)
Creatinine, Ser: 1.1 mg/dL — ABNORMAL HIGH (ref 0.44–1.00)
GFR calc Af Amer: >60 mL/min (ref >60)
GFR calc non Af Amer: 53 mL/min — ABNORMAL LOW (ref >60)
Glucose, Bld: 199 mg/dL — ABNORMAL HIGH (ref 70–99)
Potassium: 3.8 mmol/L (ref 3.5–5.1)
Sodium: 144 mmol/L (ref 135–145)

## 2019-08-18 LAB — GLUCOSE, CAPILLARY
Glucose-Capillary: 158 mg/dL — ABNORMAL HIGH (ref 70–99)
Glucose-Capillary: 174 mg/dL — ABNORMAL HIGH (ref 70–99)
Glucose-Capillary: 186 mg/dL — ABNORMAL HIGH (ref 70–99)
Glucose-Capillary: 190 mg/dL — ABNORMAL HIGH (ref 70–99)
Glucose-Capillary: 190 mg/dL — ABNORMAL HIGH (ref 70–99)
Glucose-Capillary: 194 mg/dL — ABNORMAL HIGH (ref 70–99)
Glucose-Capillary: 204 mg/dL — ABNORMAL HIGH (ref 70–99)

## 2019-08-18 LAB — MAGNESIUM: Magnesium: 2.7 mg/dL — ABNORMAL HIGH (ref 1.7–2.4)

## 2019-08-18 LAB — CULTURE, RESPIRATORY W GRAM STAIN

## 2019-08-18 LAB — PHOSPHORUS: Phosphorus: 3.2 mg/dL (ref 2.5–4.6)

## 2019-08-18 MED ORDER — AMOXICILLIN-POT CLAVULANATE 875-125 MG PO TABS: 1.0000 | ORAL_TABLET | Freq: Two times a day (BID) | ORAL | Status: DC

## 2019-08-18 MED ORDER — METHYLPREDNISOLONE SODIUM SUCC 125 MG IJ SOLR
60.0000 mg | Freq: Three times a day (TID) | INTRAMUSCULAR | Status: DC
  Administered 2019-08-18 – 2019-08-22 (×12): 60 mg via INTRAVENOUS
  Filled 2019-08-18 (×12): qty 2

## 2019-08-18 MED ORDER — AMOXICILLIN-POT CLAVULANATE 400-57 MG/5ML PO SUSR
875.0000 mg | Freq: Two times a day (BID) | ORAL | Status: DC
  Administered 2019-08-18 – 2019-08-19 (×4): 875 mg
  Filled 2019-08-18: qty 17.5
  Filled 2019-08-18 (×4): qty 10.9
  Filled 2019-08-18: qty 17.5

## 2019-08-18 MED ORDER — PANTOPRAZOLE SODIUM 40 MG PO PACK
40.0000 mg | PACK | Freq: Every day | ORAL | Status: DC
  Administered 2019-08-18 – 2019-08-19 (×2): 40 mg
  Filled 2019-08-18 (×2): qty 20

## 2019-08-18 NOTE — Progress Notes (Signed)
NAME:  Chelsea Allison, MRN:  [id number], DOB:  1955-07-28, LOS: 7 ADMISSION DATE:  08/10/2019, CONSULTATION DATE:  08/12/2019 REFERRING MD:  Dr. Posey Pronto, CHIEF COMPLAINT:  Respiratory failure    Brief History   64yo f with a PMHx of CAD/STEMI in Oct. 2020 (s/p PCI), COPD on home O2, NSCLC s/p partial lung resection, CHF presented with shortness of breath and elevated troponin. Negative cardiac cath and treated for acute COPD exasperation with improvement. Admitted to ICU on 6/5 and transferred out following improvement. Decompensated with acute mental status change and respiratory distress on 6/7. Reintubated and transferred to ICU.  Past Medical History  Tobacco abuse NSTEMI 12/12/2018 status post PCI Neuropathy Type 2 diabetes Ischemic cardiomyopathy Hypertension Hyperlipidemia COPD Colon cancer Lung cancer CAD with drug-eluting stent times 09 December 2018  [facility] Events   6/4 - admitted 6/5 - Pt became unresponsive, obtunded, in significant respiratory distress. Transferred to ICU and intubated.  6/6 - Extubated and transferred out of ICU.  6/7 - Pt in distress and acutely tachypneic, found to be in hypercarbic resp failure. Reintubated and readmitted to ICU 6/8 - clinically improving, weaning ventilator. Extubated, developed worsening wheezing, respiratory distress, and acidosis. Started on BiPAP and later intubated.   Consults:  PCCM  Procedures:  6/5 - Intubation (extubated 6/6) 6/7 - Re-intubated (extubated 6/8) 6/8 - Re-intubated  Significant Diagnostic Tests:  CTA chest 6/4> No central, segmental, or subsegmental pulmonary embolism. Mild ground-glass tree-in-bud opacities seen throughout both lungs (infectious vs. Inflammatory). Stable ill-defined pulmonary nodules in the posterior left lower lobe and right middle lobe.  Chest x-ray 6/5 > ETT and NGT in place. Developing LEFT perihilar airspace filling opacities consistent with infiltrate or edema.  Head CT  6/5 > No evidence for acute intracranial abnormality.  Echo 6/5 > estimated LVEF 35-40% with mild concentric LVH.   Bronchoscopy 6/8 > tracheobronchomalacia, mucus plugs  Repeat bronchoscopy 6/10 > Significant airway distortion and stenosis of the R airways likely due to fibrosis s/p RUL lobectomy and radiation  Micro Data:  Covid 6/4 > negative Respiratory virus panel 6/4 >  Blood culture 6/4 > no growth 3 days MRSA PCR 6/4> negative Respiratory culture 6/5 > few Staph aureus Bronchial brushing 6/8> enterococcus faecalis  BAL 6/10> GPC in chains   Antimicrobials:  Azithromycin 6/4  Ceftriaxone 6/4 > 6/5. Restarted 6/6 Cefepime 6/5 > 6/6 Vancomycin 6/5 > 6/6, 6/10>>   Interim history/subjective:  PS wean 5/5, doing well from RR and Vt standpoint, however has a significant secretion burden   Objective   Blood pressure 101/64, pulse 78, temperature 98.6 F (37 C), temperature source Oral, resp. rate (!) 22, height _0  (1.753 m), weight 84.3 kg, SpO2 98 %.    Vent Mode: PRVC FiO2 (%):  [40 %] 40 % Set Rate:  [22 bmp] 22 bmp Vt Set:  [520 mL] 520 mL PEEP:  [5 cmH20] 5 cmH20 Pressure Support:  [5 cmH20] 5 cmH20 Plateau Pressure:  [21 cmH20-23 cmH20] 21 cmH20   Intake/Output Summary (Last 24 hours) at 08/18/2019 0814 Last data filed at 08/18/2019 0724 Gross per 24 hour  Intake 3594.64 ml  Output 3460 ml  Net 134.64 ml   Filed Weights   08/16/19 0417 08/17/19 0555 08/18/19 0500  Weight: 86.9 kg 83.3 kg 84.3 kg   General:  WDWN elderly appearing F, intubated, lightly sedated NAD reclined in bed  HEENT: NCAT ETT secure pink mmm Trachea midline  CV: RRR s1s2 no rgm. Cap refill <  3 seconds  PULM:  BUL rhonchi. Copious secretions. Symmetrical chest expansion without accessory muscle use on PSV  GI: soft round ndnt normoactive  Extremities: Symmetrical bulk and tone, no obvious deformity. No pitting edema  Skin: c/d/w without rash  Neuro:  Mildly sedated, but awake, calm.  Following commands. PERRL   Resolved Hospital Problem list     Assessment & Plan:   Acute on chronic hypoxic and hypercapnic respiratory failure Admitted 6/4 with CAP and COPD exacerbation and intubated. Treated and extubated with sudden worsening on 6/7, requiring transfer back to the ICU and re-intubation. Pt extubated 6/8 and reintubated again due to acidosis and respiratory distress.  -Bronchoscopy on 6/8 showed tracheobronchomalacia. -Enterococcus faecalis on Bronch brushing P: - Weaning well PSV 5/5, but with large secretion burden. Will leave intubated 6/11; resume full MV support -Daily WUA/SBT-- ideally would like to make more progress from PNA/secretion standpoint, especially now on third intubation this admission - Started on vanc 6/10 - Brovana, Pulmicort - Cont Vanc-- follow up BAL  - Solumedrol 32m weaned from q6hr to q8hr on 6/11    CAD s/p PCIx2 in 2020 HFrEF Cardiac cath this admission with chronic LAD blockage, no significant new lesions. EF worsened to 30-35% from 45-50% 6 months ago.  P - Lasix 40 q12h  - Brillinta, ASA, statin  - Appreciate cards recs - Strict I/Os   Hx Essential HTN  Home medications include Lasix, Cozaar, metoprolol P -ICU monitoring -Holding home antihypertensives with current BP (likely due to PAD)   AKI, improving FEUrea 5.1% suggesting prerenal disease. P -trend renal indices   Type 2 DM. Elevated BG in the setting of steroid use, now improving. - SSI q4hrs - Levemir 5u BID  Best practice:  Diet: NPO, TF started 6/9 Pain/Anxiety/Delirium protocol (if indicated):Propofol/Fentanyl VAP protocol (if indicated): HOB 30 degrees, suction prn  DVT prophylaxis: lovenox GI prophylaxis: PPI Glucose control: Sliding scale insulin, Levemir BID Mobility: Bedrest Code Status: Full code Family Communication: Updated pt's uncle Disposition: ICU   Critical care time:     CRITICAL CARE Performed by: GCristal Generous  Total  critical care time: 40 minutes  Critical care time was exclusive of separately billable procedures and treating other patients. Critical care was necessary to treat or prevent imminent or life-threatening deterioration.  Critical care was time spent personally by me on the following activities: development of treatment plan with patient and/or surrogate as well as nursing, discussions with consultants, evaluation of patient's response to treatment, examination of patient, obtaining history from patient or surrogate, ordering and performing treatments and interventions, ordering and review of laboratory studies, ordering and review of radiographic studies, pulse oximetry and re-evaluation of patient's condition.  GEliseo GumMSN, AGACNP-BC LCedar [phone] no answer, [phone]/01/2020, 9:53 AM

## 2019-08-18 NOTE — Consult Note (Signed)
[facility] Nurse ostomy follow up Patient intubated, receiving care in [place]. Stoma type/location: LUQ colostomy Stomal assessment/size: deferred, supplies need to be ordered for use Peristomal assessment: deferred Treatment options for stomal/peristomal skin: barrier ring if needed Output: soft brown Ostomy pouching: 1pc. Cut to fit Education provided: none.  Patient is long term ostomate. Orders entered for ostomy appliance, Kellie Simmering [phone] and barrier ring Kellie Simmering [phone]) if needed. Thank you for the consult.  Discussed plan of care with the bedside nurse.  [place] nurse will not follow at this time.  Please re-consult the [place] team if needed.  Val Riles, RN, MSN, CWOCN, CNS-BC, pager [phone]

## 2019-08-18 NOTE — Progress Notes (Signed)
RT NOTE: Pt was placed on full vent support by NP for CCM. Pt weaned on PSV 5/5 for 3 hrs.

## 2019-08-19 ENCOUNTER — Inpatient Hospital Stay (HOSPITAL_COMMUNITY): Payer: Medicare HMO

## 2019-08-19 LAB — CBC
HCT: 43.4 % (ref 36.0–46.0)
Hemoglobin: 12.8 g/dL (ref 12.0–15.0)
MCH: 25.8 pg — ABNORMAL LOW (ref 26.0–34.0)
MCHC: 29.5 g/dL — ABNORMAL LOW (ref 30.0–36.0)
MCV: 87.3 fL (ref 80.0–100.0)
Platelets: 213 10*3/uL (ref 150–400)
RBC: 4.97 MIL/uL (ref 3.87–5.11)
RDW: 16.4 % — ABNORMAL HIGH (ref 11.5–15.5)
WBC: 16.5 10*3/uL — ABNORMAL HIGH (ref 4.0–10.5)
nRBC: 0 % (ref 0.0–0.2)

## 2019-08-19 LAB — BASIC METABOLIC PANEL
Anion gap: 11 (ref 5–15)
BUN: 64 mg/dL — ABNORMAL HIGH (ref 8–23)
CO2: 27 mmol/L (ref 22–32)
Calcium: 8.5 mg/dL — ABNORMAL LOW (ref 8.9–10.3)
Chloride: 106 mmol/L (ref 98–111)
Creatinine, Ser: 1.24 mg/dL — ABNORMAL HIGH (ref 0.44–1.00)
GFR calc Af Amer: 54 mL/min — ABNORMAL LOW (ref >60)
GFR calc non Af Amer: 46 mL/min — ABNORMAL LOW (ref >60)
Glucose, Bld: 223 mg/dL — ABNORMAL HIGH (ref 70–99)
Potassium: 4.7 mmol/L (ref 3.5–5.1)
Sodium: 144 mmol/L (ref 135–145)

## 2019-08-19 LAB — GLUCOSE, CAPILLARY
Glucose-Capillary: 113 mg/dL — ABNORMAL HIGH (ref 70–99)
Glucose-Capillary: 114 mg/dL — ABNORMAL HIGH (ref 70–99)
Glucose-Capillary: 154 mg/dL — ABNORMAL HIGH (ref 70–99)
Glucose-Capillary: 183 mg/dL — ABNORMAL HIGH (ref 70–99)
Glucose-Capillary: 194 mg/dL — ABNORMAL HIGH (ref 70–99)
Glucose-Capillary: 210 mg/dL — ABNORMAL HIGH (ref 70–99)
Glucose-Capillary: 214 mg/dL — ABNORMAL HIGH (ref 70–99)

## 2019-08-19 MED ORDER — MELATONIN 3 MG PO TABS
3.0000 mg | ORAL_TABLET | Freq: Every evening | ORAL | Status: DC | PRN
  Administered 2019-08-20 – 2019-08-23 (×5): 3 mg via ORAL
  Filled 2019-08-19 (×5): qty 1

## 2019-08-19 MED ORDER — REVEFENACIN 175 MCG/3ML IN SOLN
175.0000 ug | Freq: Every day | RESPIRATORY_TRACT | Status: DC
  Administered 2019-08-19 – 2019-08-23 (×5): 175 ug via RESPIRATORY_TRACT
  Filled 2019-08-19 (×5): qty 3

## 2019-08-19 MED ORDER — POLYETHYLENE GLYCOL 3350 17 G PO PACK: 17.0000 g | PACK | Freq: Every day | ORAL | Status: DC | PRN

## 2019-08-19 NOTE — Procedures (Signed)
Extubation Procedure Note  Patient Details:   Name: Chelsea Allison DOB: 04/01/1955 MRN: [id number]   Airway Documentation:    Vent end date: 08/19/19 Vent end time: 1206   Evaluation  O2 sats: stable throughout Complications: No apparent complications Patient did tolerate procedure well. Bilateral Breath Sounds: Clear, Diminished   Yes   Positive cuff leak noted. Patient extubated and placed on Bipap per MD order. Patient tolerated well.  Mingo Amber Justin Meisenheimer 08/19/2019, 12:21 PM

## 2019-08-19 NOTE — Progress Notes (Signed)
eLink Physician-Brief Progress Note Patient Name: Chelsea Allison DOB: 09-08-1955 MRN: [id number]   Date of Service  08/19/2019  HPI/Events of Note  RN reports patient is restless, asking for benzodiazpine. Reports that it is restlessness of legs. Has been intubated/extubated 3x this admission for respiratory failure.    eICU Interventions  Will avoid sedating medications given recurrent respiratory failure. Ordered melatonin 3mg  PO QHS PRN insomnia.  Also will check ferritin in AM given ? RLS.     Intervention Category Minor Interventions: Routine modifications to care plan (e.g. PRN medications for pain, fever)  Marily Lente Dashon Mcintire 08/19/2019, 11:08 PM

## 2019-08-19 NOTE — Progress Notes (Signed)
NAME:  Chelsea Allison, MRN:  [id number], DOB:  [birth year], LOS: 8 ADMISSION DATE:  08/10/2019, CONSULTATION DATE:  08/12/2019 REFERRING MD:  Dr. Posey Pronto, CHIEF COMPLAINT:  Respiratory failure    Brief History   64yo f with a PMHx of CAD/STEMI in Oct. 2020 (s/p PCI), COPD on home O2, NSCLC s/p partial lung resection, CHF presented with shortness of breath and elevated troponin. Negative cardiac cath and treated for acute COPD exasperation with improvement. Admitted to ICU on 6/5 and transferred out following improvement. Decompensated with acute mental status change and respiratory distress on 6/7. Reintubated and transferred to ICU.  Past Medical History  Tobacco abuse NSTEMI 12/12/2018 status post PCI Neuropathy Type 2 diabetes Ischemic cardiomyopathy Hypertension Hyperlipidemia COPD Colon cancer Lung cancer CAD with drug-eluting stent times 09 December 2018  [facility] Hospital Events   6/4 - admitted 6/5 - Pt became unresponsive, obtunded, in significant respiratory distress. Transferred to ICU and intubated.  6/6 - Extubated and transferred out of ICU.  6/7 - Pt in distress and acutely tachypneic, found to be in hypercarbic resp failure. Reintubated and readmitted to ICU 6/8 - clinically improving, weaning ventilator. Extubated, developed worsening wheezing, respiratory distress, and acidosis. Started on BiPAP and later intubated.   Consults:  PCCM  Procedures:  6/5 - Intubation (extubated 6/6) 6/7 - Re-intubated (extubated 6/8) 6/8 - Re-intubated  Significant Diagnostic Tests:  CTA chest 6/4> No central, segmental, or subsegmental pulmonary embolism. Mild ground-glass tree-in-bud opacities seen throughout both lungs (infectious vs. Inflammatory). Stable ill-defined pulmonary nodules in the posterior left lower lobe and right middle lobe.  Chest x-ray 6/5 > ETT and NGT in place. Developing LEFT perihilar airspace filling opacities consistent with infiltrate or edema.  Head CT  6/5 > No evidence for acute intracranial abnormality.  Echo 6/5 > estimated LVEF 35-40% with mild concentric LVH.   Bronchoscopy 6/8 > tracheobronchomalacia, mucus plugs  Repeat bronchoscopy 6/10 > Significant airway distortion and stenosis of the R airways likely due to fibrosis s/p RUL lobectomy and radiation  Micro Data:  Covid 6/4 > negative Respiratory virus panel 6/4 >  Blood culture 6/4 > no growth 3 days MRSA PCR 6/4> negative Respiratory culture 6/5 > few Staph aureus Bronchial brushing 6/8> enterococcus faecalis  BAL 6/10> GPC in chains   Antimicrobials:  Azithromycin 6/4  Ceftriaxone 6/4 > 6/5. Restarted 6/6 Cefepime 6/5 > 6/6 Vancomycin 6/5 > 6/6, 6/10>>   Interim history/subjective:   Patient appears very comfortable this morning on mechanical support.  Minimal secretions in ET tube.  Tolerating SBT SAT on low-dose sedation propofol and fentanyl remains on mechanical life support.  Objective   Blood pressure 111/75, pulse 81, temperature 98.1 F (36.7 C), temperature source Oral, resp. rate 12, height _0  (1.753 m), weight 80.3 kg, SpO2 100 %.    Vent Mode: PSV;CPAP FiO2 (%):  [40 %] 40 % Set Rate:  [22 bmp] 22 bmp Vt Set:  [520 mL] 520 mL PEEP:  [5 cmH20] 5 cmH20 Pressure Support:  [10 cmH20] 10 cmH20 Plateau Pressure:  [19 cmH20-26 cmH20] 19 cmH20   Intake/Output Summary (Last 24 hours) at 08/19/2019 1106 Last data filed at 08/19/2019 0700 Gross per 24 hour  Intake 2276 ml  Output 2000 ml  Net 276 ml   Filed Weights   08/17/19 0555 08/18/19 0500 08/19/19 0645  Weight: 83.3 kg 84.3 kg 80.3 kg   General: Female, intubated on mechanical life support comfortable HEENT: Endotracheal tube in place, minimal secretions CV: Regular  rate rhythm S1-S2 PULM: Bilateral mechanical ventilator breath sounds GI: Soft nontender nondistended Extremities no significant edema Skin: No obvious rash Neuro: Alert oriented following commands moves all 4  Resolved  Hospital Problem list     Assessment & Plan:   Acute on chronic hypoxic and hypercapnic respiratory failure Admitted 6/4 with CAP and COPD exacerbation and intubated. Treated and extubated with sudden worsening on 6/7, requiring transfer back to the ICU and re-intubation. Pt extubated 6/8 and reintubated again due to acidosis and respiratory distress.  -Bronchoscopy on 6/8 showed tracheobronchomalacia. -Enterococcus faecalis on Bronch brushing P: -Maintained in pressure support. Tolerating SBT SAT Continue Brovana Pulmicort as needed albuterol Add Yupelri. -We need to give her best shot at liberation from the ventilator. -I think longer we leave her on the greater chance that she is going to end up needing tracheostomy and become weaker and weaker. -She looks good to me this morning. -We will extubate to BiPAP directly.  Discussed plan with RT. -Must use BiPAP nightly and with naps. Extubate to BiPAP for the time being as well.  CAD s/p PCIx2 in 2020 HFrEF Cardiac cath this admission with chronic LAD blockage, no significant new lesions. EF worsened to 30-35% from 45-50% 6 months ago.  P -Lasix 40 mg every 12 hours -Brilinta aspirin statin Appreciate cardiology input Given fluid balance at this time  Hx Essential HTN  Home medications include Lasix, Cozaar, metoprolol P -Can reintroduce once needed  AKI, improving FEUrea 5.1% suggesting prerenal disease. P -Continue to follow urine output  Type 2 DM. Elevated BG in the setting of steroid use, now improving. -SSI plus Levemir  Best practice:  Diet: NPO, TF started 6/9 Pain/Anxiety/Delirium protocol (if indicated):Propofol/Fentanyl VAP protocol (if indicated): HOB 30 degrees, suction prn  DVT prophylaxis: lovenox GI prophylaxis: PPI Glucose control: Sliding scale insulin, Levemir BID Mobility: Bedrest Code Status: Full code Family Communication: We will update patient's family. Disposition: ICU  This patient is  critically ill with multiple organ system failure; which, requires frequent high complexity decision making, assessment, support, evaluation, and titration of therapies. This was completed through the application of advanced monitoring technologies and extensive interpretation of multiple databases. During this encounter critical care time was devoted to patient care services described in this note for 34 minutes.  Garner Nash, DO [facility] 08/19/2019 11:06 AM

## 2019-08-19 NOTE — Plan of Care (Signed)

## 2019-08-20 LAB — COMPREHENSIVE METABOLIC PANEL
ALT: 24 U/L (ref 0–44)
AST: 19 U/L (ref 15–41)
Albumin: 3.3 g/dL — ABNORMAL LOW (ref 3.5–5.0)
Alkaline Phosphatase: 49 U/L (ref 38–126)
Anion gap: 12 (ref 5–15)
BUN: 54 mg/dL — ABNORMAL HIGH (ref 8–23)
CO2: 27 mmol/L (ref 22–32)
Calcium: 8.8 mg/dL — ABNORMAL LOW (ref 8.9–10.3)
Chloride: 96 mmol/L — ABNORMAL LOW (ref 98–111)
Creatinine, Ser: 0.98 mg/dL (ref 0.44–1.00)
GFR calc Af Amer: >60 mL/min (ref >60)
GFR calc non Af Amer: >60 mL/min (ref >60)
Glucose, Bld: 141 mg/dL — ABNORMAL HIGH (ref 70–99)
Potassium: 4.2 mmol/L (ref 3.5–5.1)
Sodium: 135 mmol/L (ref 135–145)
Total Bilirubin: 0.9 mg/dL (ref 0.3–1.2)
Total Protein: 6.3 g/dL — ABNORMAL LOW (ref 6.5–8.1)

## 2019-08-20 LAB — CBC
HCT: 43 % (ref 36.0–46.0)
Hemoglobin: 12.8 g/dL (ref 12.0–15.0)
MCH: 25.5 pg — ABNORMAL LOW (ref 26.0–34.0)
MCHC: 29.8 g/dL — ABNORMAL LOW (ref 30.0–36.0)
MCV: 85.8 fL (ref 80.0–100.0)
Platelets: 231 10*3/uL (ref 150–400)
RBC: 5.01 MIL/uL (ref 3.87–5.11)
RDW: 15.7 % — ABNORMAL HIGH (ref 11.5–15.5)
WBC: 22.2 10*3/uL — ABNORMAL HIGH (ref 4.0–10.5)
nRBC: 0 % (ref 0.0–0.2)

## 2019-08-20 LAB — GLUCOSE, CAPILLARY
Glucose-Capillary: 151 mg/dL — ABNORMAL HIGH (ref 70–99)
Glucose-Capillary: 152 mg/dL — ABNORMAL HIGH (ref 70–99)
Glucose-Capillary: 170 mg/dL — ABNORMAL HIGH (ref 70–99)
Glucose-Capillary: 153 mg/dL — ABNORMAL HIGH (ref 70–99)
Glucose-Capillary: 142 mg/dL — ABNORMAL HIGH (ref 70–99)

## 2019-08-20 LAB — FERRITIN: Ferritin: 67 ng/mL (ref 11–307)

## 2019-08-20 MED ORDER — ACETAMINOPHEN 325 MG PO TABS
650.0000 mg | ORAL_TABLET | ORAL | Status: DC | PRN
  Administered 2019-08-20 – 2019-08-25 (×14): 650 mg via ORAL
  Filled 2019-08-20 (×14): qty 2

## 2019-08-20 MED ORDER — POLYETHYLENE GLYCOL 3350 17 G PO PACK: 17.0000 g | PACK | Freq: Every day | ORAL | Status: DC | PRN

## 2019-08-20 MED ORDER — DOCUSATE SODIUM 100 MG PO CAPS
100.0000 mg | ORAL_CAPSULE | Freq: Two times a day (BID) | ORAL | Status: DC
  Administered 2019-08-21 – 2019-08-22 (×2): 100 mg via ORAL
  Filled 2019-08-20 (×7): qty 1

## 2019-08-20 MED ORDER — POLYETHYLENE GLYCOL 3350 17 G PO PACK: 17.0000 g | PACK | Freq: Every day | ORAL | Status: DC

## 2019-08-20 MED ORDER — PANTOPRAZOLE SODIUM 40 MG PO TBEC
40.0000 mg | DELAYED_RELEASE_TABLET | Freq: Every day | ORAL | Status: DC
  Administered 2019-08-20 – 2019-08-23 (×4): 40 mg via ORAL
  Filled 2019-08-20 (×5): qty 1

## 2019-08-20 MED ORDER — AMOXICILLIN-POT CLAVULANATE 400-57 MG/5ML PO SUSR
875.0000 mg | Freq: Two times a day (BID) | ORAL | Status: AC
  Administered 2019-08-20 – 2019-08-24 (×8): 875 mg via ORAL
  Filled 2019-08-20 (×8): qty 10.9

## 2019-08-20 MED ORDER — AMOXICILLIN-POT CLAVULANATE 875-125 MG PO TABS
1.0000 | ORAL_TABLET | Freq: Two times a day (BID) | ORAL | Status: DC
  Administered 2019-08-20: 1 via ORAL
  Filled 2019-08-20 (×2): qty 1

## 2019-08-20 MED ORDER — FUROSEMIDE 10 MG/ML IJ SOLN
40.0000 mg | Freq: Every day | INTRAMUSCULAR | Status: DC
  Administered 2019-08-21 – 2019-08-24 (×4): 40 mg via INTRAVENOUS
  Filled 2019-08-20 (×4): qty 4

## 2019-08-20 MED ORDER — POTASSIUM CHLORIDE CRYS ER 20 MEQ PO TBCR
40.0000 meq | EXTENDED_RELEASE_TABLET | Freq: Every day | ORAL | Status: DC
  Administered 2019-08-20 – 2019-08-21 (×2): 40 meq via ORAL
  Filled 2019-08-20 (×3): qty 2

## 2019-08-20 MED ORDER — INSULIN ASPART 100 UNIT/ML ~~LOC~~ SOLN
3.0000 [IU] | Freq: Three times a day (TID) | SUBCUTANEOUS | Status: DC
  Administered 2019-08-20: 3 [IU] via SUBCUTANEOUS
  Administered 2019-08-21 (×2): 9 [IU] via SUBCUTANEOUS
  Administered 2019-08-21 – 2019-08-22 (×3): 3 [IU] via SUBCUTANEOUS
  Administered 2019-08-22 – 2019-08-23 (×2): 6 [IU] via SUBCUTANEOUS

## 2019-08-20 MED ORDER — TICAGRELOR 90 MG PO TABS
90.0000 mg | ORAL_TABLET | Freq: Two times a day (BID) | ORAL | Status: DC
  Administered 2019-08-20 – 2019-08-25 (×10): 90 mg via ORAL
  Filled 2019-08-20 (×10): qty 1

## 2019-08-20 MED ORDER — ASPIRIN EC 81 MG PO TBEC
81.0000 mg | DELAYED_RELEASE_TABLET | Freq: Every day | ORAL | Status: DC
  Administered 2019-08-21 – 2019-08-25 (×5): 81 mg via ORAL
  Filled 2019-08-20 (×5): qty 1

## 2019-08-20 MED ORDER — CHLORHEXIDINE GLUCONATE 0.12 % MT SOLN
OROMUCOSAL | Status: AC
  Filled 2019-08-20: qty 15

## 2019-08-20 MED ORDER — ROSUVASTATIN CALCIUM 20 MG PO TABS
20.0000 mg | ORAL_TABLET | Freq: Every day | ORAL | Status: DC
  Administered 2019-08-21 – 2019-08-25 (×5): 20 mg via ORAL
  Filled 2019-08-20 (×5): qty 1

## 2019-08-20 MED ORDER — DOCUSATE SODIUM 100 MG PO CAPS: 100.0000 mg | ORAL_CAPSULE | Freq: Two times a day (BID) | ORAL | Status: DC | PRN

## 2019-08-20 NOTE — Progress Notes (Signed)
NAME:  Chelsea Allison, MRN:  [id number], DOB:  02/11/1956, LOS: 9 ADMISSION DATE:  08/10/2019, CONSULTATION DATE:  08/12/2019 REFERRING MD:  Dr. Posey Pronto, CHIEF COMPLAINT:  Respiratory failure    Brief History   64yo f with a PMHx of CAD/STEMI in Oct. 2020 (s/p PCI), COPD on home O2, NSCLC s/p partial lung resection, CHF presented with shortness of breath and elevated troponin. Negative cardiac cath and treated for acute COPD exasperation with improvement. Admitted to ICU on 6/5 and transferred out following improvement. Decompensated with acute mental status change and respiratory distress on 6/7. Reintubated and transferred to ICU.  Past Medical History  Tobacco abuse NSTEMI 12/12/2018 status post PCI Neuropathy Type 2 diabetes Ischemic cardiomyopathy Hypertension Hyperlipidemia COPD Colon cancer Lung cancer CAD with drug-eluting stent times 09 December 2018  [facility] Hospital Events   6/4 - admitted 6/5 - Pt became unresponsive, obtunded, in significant respiratory distress. Transferred to ICU and intubated.  6/6 - Extubated and transferred out of ICU.  6/7 - Pt in distress and acutely tachypneic, found to be in hypercarbic resp failure. Reintubated and readmitted to ICU 6/8 - clinically improving, weaning ventilator. Extubated, developed worsening wheezing, respiratory distress, and acidosis. Started on BiPAP and later intubated.   Consults:  PCCM  Procedures:  6/5 - Intubation (extubated 6/6) 6/7 - Re-intubated (extubated 6/8) 6/8 - Re-intubated  Significant Diagnostic Tests:  CTA chest 6/4> No central, segmental, or subsegmental pulmonary embolism. Mild ground-glass tree-in-bud opacities seen throughout both lungs (infectious vs. Inflammatory). Stable ill-defined pulmonary nodules in the posterior left lower lobe and right middle lobe.  Chest x-ray 6/5 > ETT and NGT in place. Developing LEFT perihilar airspace filling opacities consistent with infiltrate or edema.  Head CT  6/5 > No evidence for acute intracranial abnormality.  Echo 6/5 > estimated LVEF 35-40% with mild concentric LVH.   Bronchoscopy 6/8 > tracheobronchomalacia, mucus plugs  Repeat bronchoscopy 6/10 > Significant airway distortion and stenosis of the R airways likely due to fibrosis s/p RUL lobectomy and radiation  Micro Data:  Covid 6/4 > negative Respiratory virus panel 6/4 >  Blood culture 6/4 > no growth 3 days MRSA PCR 6/4> negative Respiratory culture 6/5 > few Staph aureus Bronchial brushing 6/8> enterococcus faecalis  BAL 6/10> GPC in chains   Antimicrobials:  Azithromycin 6/4  Ceftriaxone 6/4 > 6/5. Restarted 6/6 Cefepime 6/5 > 6/6 Vancomycin 6/5 > 6/6, 6/10>>   Interim history/subjective:   Patient appears comfortable sitting up in chair.  Use BiPAP as needed last night.  Objective   Blood pressure 103/61, pulse 74, temperature 98.9 F (37.2 C), temperature source Oral, resp. rate (!) 26, height _0  (1.753 m), weight 80.8 kg, SpO2 96 %.    Vent Mode: BIPAP;PCV FiO2 (%):  [40 %] 40 % Set Rate:  [15 bmp] 15 bmp PEEP:  [5 cmH20] 5 cmH20   Intake/Output Summary (Last 24 hours) at 08/20/2019 1116 Last data filed at 08/20/2019 0600 Gross per 24 hour  Intake 901.44 ml  Output 2100 ml  Net -1198.56 ml   Filed Weights   08/18/19 0500 08/19/19 0645 08/20/19 0500  Weight: 84.3 kg 80.3 kg 80.8 kg   General: Elderly female resting in chair comfortable HEENT: Tracking appropriately NCAT CV: Regular rate rhythm S1-S2 PULM: Diminished breath sounds bilaterally, faint expiratory wheeze in left base GI: Soft nontender nondistended Extremities no significant edema, some muscle wasting present Skin: No rash Neuro: Alert oriented following commands  Resolved Hospital Problem list  Assessment & Plan:   Acute on chronic hypoxic and hypercapnic respiratory failure Admitted 6/4 with CAP and COPD exacerbation and intubated. Treated and extubated with sudden worsening on  6/7, requiring transfer back to the ICU and re-intubation. Pt extubated 6/8 and reintubated again due to acidosis and respiratory distress.  -Bronchoscopy on 6/8 showed tracheobronchomalacia. -Enterococcus faecalis on Bronch brushing P: Continue Brovana, Pulmicort, Yupelri, as needed albuterol BiPAP as needed Observe in the ICU for close respiratory monitoring. If remains stable can consider transfer from the unit tomorrow.  She has been intubated 4 times would like to watch closely.  CAD s/p PCIx2 in 2020 HFrEF Cardiac cath this admission with chronic LAD blockage, no significant new lesions. EF worsened to 30-35% from 45-50% 6 months ago.  P -Lasix 40 mg daily -Brilinta, aspirin, statin -Appreciate cardiology input  Hx Essential HTN  Home medications include Lasix, Cozaar, metoprolol P -Can reintroduce per cardiology  AKI, improving FEUrea 5.1% suggesting prerenal disease. P -Follow urine output  Type 2 DM. Elevated BG in the setting of steroid use, now improving. -SSI plus Levemir   Best practice:   Diet: NPO, TF started 6/9 Pain/Anxiety/Delirium protocol (if indicated): Propofol/Fentanyl VAP protocol (if indicated): HOB 30 degrees, suction prn  DVT prophylaxis: lovenox GI prophylaxis: PPI Glucose control: Sliding scale insulin, Levemir BID Mobility: Bedrest Code Status: Full code Family Communication: We will update patient's family. Disposition: ICU   Garner Nash, DO [facility] 08/20/2019 11:16 AM

## 2019-08-20 NOTE — Progress Notes (Signed)
RT instructed patient on the use of flutter valve and incentive spirometer. Patient able to demonstrate back good technique.

## 2019-08-21 LAB — CULTURE, RESPIRATORY W GRAM STAIN: Gram Stain: NONE SEEN

## 2019-08-21 LAB — GLUCOSE, CAPILLARY
Glucose-Capillary: 142 mg/dL — ABNORMAL HIGH (ref 70–99)
Glucose-Capillary: 139 mg/dL — ABNORMAL HIGH (ref 70–99)
Glucose-Capillary: 202 mg/dL — ABNORMAL HIGH (ref 70–99)
Glucose-Capillary: 212 mg/dL — ABNORMAL HIGH (ref 70–99)

## 2019-08-21 MED ORDER — NICOTINE 21 MG/24HR TD PT24
21.0000 mg | MEDICATED_PATCH | Freq: Every day | TRANSDERMAL | Status: DC
  Administered 2019-08-21 – 2019-08-25 (×5): 21 mg via TRANSDERMAL
  Filled 2019-08-21 (×6): qty 1

## 2019-08-21 MED ORDER — GUAIFENESIN ER 600 MG PO TB12
600.0000 mg | ORAL_TABLET | Freq: Two times a day (BID) | ORAL | Status: DC
  Administered 2019-08-21 – 2019-08-25 (×8): 600 mg via ORAL
  Filled 2019-08-21 (×8): qty 1

## 2019-08-21 MED ORDER — CHLORHEXIDINE GLUCONATE 0.12 % MT SOLN
OROMUCOSAL | Status: AC
  Administered 2019-08-21: 15 mL via OROMUCOSAL
  Filled 2019-08-21: qty 15

## 2019-08-21 MED ORDER — CHLORHEXIDINE GLUCONATE 0.12 % MT SOLN
OROMUCOSAL | Status: AC
  Filled 2019-08-21: qty 15

## 2019-08-21 MED ORDER — ADULT MULTIVITAMIN W/MINERALS CH
1.0000 | ORAL_TABLET | Freq: Every day | ORAL | Status: DC
  Administered 2019-08-21 – 2019-08-25 (×5): 1 via ORAL
  Filled 2019-08-21 (×5): qty 1

## 2019-08-21 MED ORDER — ENSURE ENLIVE PO LIQD
237.0000 mL | Freq: Three times a day (TID) | ORAL | Status: DC
  Administered 2019-08-21 – 2019-08-22 (×3): 237 mL via ORAL

## 2019-08-21 NOTE — Evaluation (Signed)
Clinical/Bedside Swallow Evaluation Patient Details  Name: Chelsea Allison MRN: [id number] Date of Birth: November 19, 1955  Today's Date: 08/21/2019 Time: SLP Start Time (ACUTE ONLY): 100 SLP Stop Time (ACUTE ONLY): 1051 SLP Time Calculation (min) (ACUTE ONLY): 15 min  Past Medical History:  Past Medical History:  Diagnosis Date  . CAD (coronary artery disease) 10/20 DES to pRCA, DES mCx   . Cancer of lung ([place])    Twice  . Colon cancer ([place])   . COPD (chronic obstructive pulmonary disease) ([place])   . Hyperlipidemia   . Hypertension   . Ischemic cardiomyopathy   . Neuropathy    Secondary to chemo  . STEMI (ST elevation myocardial infarction) ([place]) 12/12/2018  . Tobacco abuse    Past Surgical History:  Past Surgical History:  Procedure Laterality Date  . ABDOMINAL SURGERY     For colon cancer  . COLOSTOMY    . CORONARY STENT INTERVENTION N/A 12/20/2018   Procedure: CORONARY STENT INTERVENTION - Right;  Surgeon: Burnell Blanks, MD;  Location: [facility];  Service: Cardiovascular;  Laterality: N/A;  . CORONARY/GRAFT ACUTE MI REVASCULARIZATION N/A 12/12/2018   Procedure: Coronary/Graft Acute MI Revascularization;  Surgeon: Belva Crome, MD;  Location: [facility];  Service: Cardiovascular;  Laterality: N/A;  . LEFT HEART CATH AND CORONARY ANGIOGRAPHY N/A 12/12/2018   Procedure: LEFT HEART CATH AND CORONARY ANGIOGRAPHY;  Surgeon: Belva Crome, MD;  Location: [facility];  Service: Cardiovascular;  Laterality: N/A;  . LEFT HEART CATH AND CORONARY ANGIOGRAPHY N/A 08/11/2019   Procedure: LEFT HEART CATH AND CORONARY ANGIOGRAPHY;  Surgeon: Burnell Blanks, MD;  Location: [facility];  Service: Cardiovascular;  Laterality: N/A;   HPI:  64yo f with a PMHx of CAD/STEMI in Oct. 2020 (s/p PCI), COPD on home O2, NSCLC s/p partial lung resection, colon ca, CHF presented 6/3 with shortness of breath and elevated troponin. Negative cardiac cath and treated for  acute COPD exacerbation with improvement. 6/5 respiratory distress; admitted to ICU, intubated 6/5-6/6. Hypercarbic resp failure with reintubation 6/7-6/8. Reintubated 6/8 due to acidosis and resp distress, extubated 6/12. Bronch 6/10 revealed significant airway distortion and stenosis of the R airways likely due to fibrosis s/p RUL lobectomy and radiation.    Assessment / Plan / Recommendation Clinical Impression  Pt presents with functional oropharyngeal swallow with adequate oral manipulation/mastication, the appearance of a brisk swallow response, no overt s/s of aspiration with mixed solid/liquid consistencies.  Coordination of swallow/respiratory cycles appeared to be adequate.  RR well within the parameters for functional synchrony.  Recommend continuing current diet (regular solids, thin liquids).  No SLP f/u is needed.  SLP Visit Diagnosis: Dysphagia, unspecified (R13.10)    Aspiration Risk  No limitations    Diet Recommendation   regular solids, thin liquids  Medication Administration: Whole meds with liquid    Other  Recommendations Oral Care Recommendations: Oral care BID   Follow up Recommendations None        Swallow Study   General Date of Onset: 08/10/19 HPI: 64yo f with a PMHx of CAD/STEMI in Oct. 2020 (s/p PCI), COPD on home O2, NSCLC s/p partial lung resection, colon ca, CHF presented 6/3 with shortness of breath and elevated troponin. Negative cardiac cath and treated for acute COPD exacerbation with improvement. 6/5 respiratory distress; admitted to ICU, intubated 6/5-6/6. Hypercarbic resp failure with reintubation 6/7-6/8. Reintubated 6/8 due to acidosis and resp distress, extubated 6/12. Bronch 6/10 revealed significant airway distortion  and stenosis of the R airways likely due to fibrosis s/p RUL lobectomy and radiation.  Type of Study: Bedside Swallow Evaluation Previous Swallow Assessment: Oct 2020 Diet Prior to this Study: Regular;Thin liquids Temperature Spikes  Noted: No Respiratory Status: Nasal cannula History of Recent Intubation: Yes Length of Intubations (days):  (x3) Date extubated: 08/19/19 Behavior/Cognition: Alert;Cooperative;Pleasant mood Oral Cavity Assessment: Within Functional Limits Oral Care Completed by SLP: No Oral Cavity - Dentition: Adequate natural dentition Vision: Functional for self-feeding Self-Feeding Abilities: Able to feed self Patient Positioning: Upright in bed Baseline Vocal Quality: Hoarse Volitional Cough: Strong Volitional Swallow: Able to elicit    Oral/Motor/Sensory Function Overall Oral Motor/Sensory Function: Within functional limits   Ice Chips Ice chips: Within functional limits   Thin Liquid Thin Liquid: Within functional limits    Nectar Thick Nectar Thick Liquid: Not tested   Honey Thick Honey Thick Liquid: Not tested   Puree Puree: Within functional limits   Solid     Solid: Within functional limits      Juan Quam Laurice 08/21/2019,10:53 AM  Estill Bamberg L. Tivis Ringer, [place] Office number [phone] Pager [phone]

## 2019-08-21 NOTE — Evaluation (Signed)
Occupational Therapy Evaluation Patient Details Name: Chelsea Allison MRN: [id number] DOB: 10-08-1955 Today's Date: 08/21/2019    History of Present Illness Pt adm 6/4 with acute copd exacerbation. Respiratory distress on 6/5 and intubated 6/5-6/6. Reintubated 6/7- 6/8. Reintubated 6/8-6/12. PMH - NSTEMI, peripheral neuropathy, DM, HTN, COPD, colon CA, lung CA, CAD.    Clinical Impression   This 64 y/o female presents with the above. PTA pt reports being independent with ADL, iADL and functional mobility, living alone. Pt currently presenting with overall weakness, decreased activity tolerance and mobility status impacting her functional performance. Pt currently requiring two person assist for safe completion of functional transfers using RW, tolerating transfer to [facility] and then to recliner during session. Pt requiring up to [place] for LB ADL, setup/supervision for seated UB ADL. VSS throughout on 2L O2. Pt reports will look into getting someone who can assist at home at time of discharge; given current deficits feel she will benefit from post acute rehab services prior to return home to maximize her safety and independence with ADL and mobility (pt currently adamant to return home - if she refuses SNF and returns home recommend [place] services and 24hr supervision/assist). Acute OT to follow.     Follow Up Recommendations  SNF;Supervision/Assistance - 24 hour (pt may refuse SNF, if so rec HHOT and 24hr)    Equipment Recommendations  Tub/shower seat           Precautions / Restrictions Precautions Precautions: Fall Restrictions Weight Bearing Restrictions: No      Mobility Bed Mobility Overal bed mobility: Needs Assistance Bed Mobility: Supine to Sit     Supine to sit: Supervision;HOB elevated     General bed mobility comments: supervision for lines/safety.  Transfers Overall transfer level: Needs assistance Equipment used: Rolling walker (2 wheeled) Transfers: Sit to/from  [facility] Sit to Stand: +2 physical assistance;Min assist Stand pivot transfers: +2 physical assistance;Min assist       General transfer comment: Assist to bring hips up and for balance. Pt very tremulous and fatigued quickly.    Balance Overall balance assessment: Needs assistance Sitting-balance support: No upper extremity supported;Feet supported Sitting balance-Leahy Scale: Good     Standing balance support: Bilateral upper extremity supported Standing balance-Leahy Scale: Poor Standing balance comment: walker and min assist                           ADL either performed or assessed with clinical judgement   ADL Overall ADL's : Needs assistance/impaired Eating/Feeding: Modified independent;Sitting Eating/Feeding Details (indicate cue type and reason): pt lunch tray arriving and setup end of session Grooming: Wash/dry face;Set up;Supervision/safety;Sitting Grooming Details (indicate cue type and reason): seated EOB Upper Body Bathing: Minimal assistance;Sitting   Lower Body Bathing: Moderate assistance;Sit to/from stand   Upper Body Dressing : Minimal assistance;Sitting   Lower Body Dressing: Minimal assistance;+2 for physical assistance;+2 for safety/equipment;Sit to/from stand Lower Body Dressing Details (indicate cue type and reason): minA+2 for sit<>stand, pt able to don socks EOB via figure 4 - fatigued with completing task and required rest  Toilet Transfer: Minimal assistance;+2 for physical assistance;Stand-pivot;BSC;RW   Toileting- Clothing Manipulation and Hygiene: Minimal assistance;+2 for physical assistance;Sit to/from stand Toileting - Clothing Manipulation Details (indicate cue type and reason): pt able to perform her own pericare after voiding bladder - +2 assist for balance/gown management      Functional mobility during ADLs: Minimal assistance;+2 for physical assistance;+2 for safety/equipment;Rolling walker General ADL  Comments: pt with poor endurance, overall weakness                         Pertinent Vitals/Pain Pain Assessment: No/denies pain     Hand Dominance Right   Extremity/Trunk Assessment Upper Extremity Assessment Upper Extremity Assessment: Generalized weakness   Lower Extremity Assessment Lower Extremity Assessment: Defer to PT evaluation       Communication Communication Communication: No difficulties   Cognition Arousal/Alertness: Awake/alert Behavior During Therapy: WFL for tasks assessed/performed Overall Cognitive Status: Within Functional Limits for tasks assessed                                 General Comments: At times seemed a little slowed   General Comments  Pt on 2L with SpO2 >92%. Pt endorses dizziness intermittently but BP stable     Exercises     Shoulder Instructions      Home Living Family/patient expects to be discharged to:: Private residence Living Arrangements: Alone Available Help at Discharge: Family;Available PRN/intermittently Type of Home: Apartment Home Access: Level entry     Home Layout: One level     Bathroom Shower/Tub: Teacher, early years/pre: Standard     Home Equipment: Environmental consultant - 2 wheels;Cane - single point;Bedside commode;Tub bench   Additional Comments: reports tub bench doesn't fit in bathroom      Prior Functioning/Environment Level of Independence: Independent                 OT Problem List: Decreased strength;Decreased range of motion;Decreased activity tolerance;Impaired balance (sitting and/or standing);Cardiopulmonary status limiting activity;Decreased knowledge of use of DME or AE      OT Treatment/Interventions: Self-care/ADL training;Therapeutic exercise;Energy conservation;DME and/or AE instruction;Therapeutic activities;Patient/family education;Balance training    OT Goals(Current goals can be found in the care plan section) Acute Rehab OT Goals Patient Stated Goal:  go home OT Goal Formulation: With patient Time For Goal Achievement: 09/04/19 Potential to Achieve Goals: Good  OT Frequency: Min 2X/week   Barriers to D/C:            Co-evaluation PT/OT/SLP Co-Evaluation/Treatment: Yes Reason for Co-Treatment: For patient/therapist safety (pt with poor endurance ) PT goals addressed during session: Mobility/safety with mobility;Balance OT goals addressed during session: ADL's and self-care      AM-PAC OT "6 Clicks" Daily Activity     Outcome Measure Help from another person eating meals?: None Help from another person taking care of personal grooming?: A Little Help from another person toileting, which includes using toliet, bedpan, or urinal?: A Little Help from another person bathing (including washing, rinsing, drying)?: A Lot Help from another person to put on and taking off regular upper body clothing?: A Little Help from another person to put on and taking off regular lower body clothing?: A Lot 6 Click Score: 17   End of Session Equipment Utilized During Treatment: Gait belt;Rolling walker;Oxygen Nurse Communication: Mobility status  Activity Tolerance: Patient tolerated treatment well;Patient limited by fatigue Patient left: in chair;with call bell/phone within reach;with chair alarm set  OT Visit Diagnosis: Muscle weakness (generalized) (M62.81);Unsteadiness on feet (R26.81)                Time: 1751-0258 OT Time Calculation (min): 35 min Charges:  OT General Charges $OT Visit: 1 Visit OT Evaluation $OT Eval Moderate Complexity: [place], OT Acute Rehabilitation Services Pager [phone]  Office [phone]   Raymondo Band 08/21/2019, 2:48 PM

## 2019-08-21 NOTE — TOC Initial Note (Signed)
Transition of Care [facility]) - Initial/Assessment Note    Patient Details  Name: Chelsea Allison MRN: [id number] Date of Birth: Jan 11, 1956  [facility] CM/SW Contact:    Trula Ore, [place] Phone Number: 08/21/2019, 1:55 PM  Clinical Narrative:                   CSW spoke with patient at bedside. Patient declined SNF placement. Patient wants to go home with home health services. CSW let patient know she will reach out to casemanager tto speak with her about home health services. Patient gave CSW permission to discuss patients care with her Georgianne Fick.  CSW will continue to follow for any other patient needs.  Expected Discharge Plan: [place] Barriers to Discharge: Continued Medical Work up   Patient Goals and CMS Choice Patient states their goals for this hospitalization and ongoing recovery are:: to go home with home health services CMS Medicare.gov Compare Post Acute Care list provided to:: Patient Choice offered to / list presented to : Patient  Expected Discharge Plan and Services Expected Discharge Plan: [place]       Living arrangements for the past 2 months: Single Family Home                                      Prior Living Arrangements/Services Living arrangements for the past 2 months: Single Family Home Lives with:: Self Patient language and need for interpreter reviewed:: Yes Do you feel safe going back to the place where you live?: Yes      Need for Family Participation in Patient Care: Yes (Comment) Care giver support system in place?: Yes (comment)   Criminal Activity/Legal Involvement Pertinent to Current Situation/Hospitalization: No - Comment as needed  Activities of Daily Living Home Assistive Devices/Equipment: None ADL Screening (condition at time of admission) Patient's cognitive ability adequate to safely complete daily activities?: Yes Is the patient deaf or have difficulty hearing?:  No Does the patient have difficulty seeing, even when wearing glasses/contacts?: No Does the patient have difficulty concentrating, remembering, or making decisions?: No Patient able to express need for assistance with ADLs?: Yes Does the patient have difficulty dressing or bathing?: No Independently performs ADLs?: Yes (appropriate for developmental age) Does the patient have difficulty walking or climbing stairs?: No Weakness of Legs: Both Weakness of Arms/Hands: None  Permission Sought/Granted Permission sought to share information with : Case Manager, Family Supports, Chartered certified accountant granted to share information with : Yes, Verbal Permission Granted  Share Information with NAME: Juliann Pulse  Permission granted to share info w AGENCY: [facility]  Permission granted to share info w Relationship: Aunt  Permission granted to share info w Contact Information: Juliann Pulse [phone]  Emotional Assessment Appearance:: Appears stated age Attitude/Demeanor/Rapport: Gracious Affect (typically observed): Calm Orientation: : Oriented to Self, Oriented to Place, Oriented to  Time, Oriented to Situation Alcohol / Substance Use: Not Applicable Psych Involvement: No (comment)  Admission diagnosis:  Acute respiratory failure ([place]) [J96.00] Acute on chronic respiratory failure with hypoxia ([place]) [J96.21] Patient Active Problem List   Diagnosis Date Noted  . Acute respiratory failure ([place]) 08/11/2019  . CHF exacerbation ([place]) 08/11/2019  . AKI (acute kidney injury) ([place]) 08/11/2019  . NSTEMI (non-ST elevated myocardial infarction) ([place])   . Elevated troponin   . Renal insufficiency   . Chronic combined systolic and diastolic  heart failure ([place])   . Respiratory failure with hypoxia and hypercapnia ([place]) 01/08/2019  . Hyperlipidemia 12/20/2018  . Hypertension 12/20/2018  . CAP (community acquired pneumonia) 12/20/2018  . Tobacco abuse 12/20/2018  . CAD in native artery 12/20/2018   . Unstable angina ([place])   . Hypoxemia   . Acute respiratory failure with hypoxemia ([place])   . Acute pulmonary edema (H[facility])   . COPD with acute exacerbation ([place])   . STEMI (ST elevation myocardial infarction) ([place]) 12/12/2018  . Acute MI, lateral wall ([place]) 12/12/2018  . Acute on chronic respiratory failure with hypercapnia ([place])   . Acute combined systolic and diastolic heart failure ([place])    PCP:  Beckie Salts, MD Pharmacy:   [facility], [place] - [address] [place] [address] Phone: [phone] Fax: [phone]     Social Determinants of Health (SDOH) Interventions    Readmission Risk Interventions Readmission Risk Prevention Plan 01/11/2019 12/20/2018 12/20/2018  Transportation Screening Complete - Complete  PCP or Specialist Appt within 5-7 Days - Complete -  PCP or Specialist Appt within 3-5 Days (No Data) - -  Home Care Screening - - Complete  Medication Review (RN CM) - - Complete  HRI or Home Care Consult Complete - -  Social Work Consult for [place] Planning/Counseling Complete - -  Palliative Care Screening Not Applicable - -  Medication Review (RN Care Manager) Complete - -  Some recent data might be hidden

## 2019-08-21 NOTE — Evaluation (Signed)
Physical Therapy Evaluation Patient Details Name: Lucendia Leard MRN: [id number] DOB: Jul 29, 1955 Today's Date: 08/21/2019   History of Present Illness  Pt adm 6/4 with acute copd exacerbation. Respiratory distress on 6/5 and intubated 6/5-6/6. Reintubated 6/7- 6/8. Reintubated 6/8-6/12. PMH - NSTEMI, peripheral neuropathy, DM, HTN, COPD, colon CA, lung CA, CAD.   Clinical Impression  Pt presents to PT with decr mobility and poor activity tolerance. Pt lives alone and in the past has had a friend who could come stay with her at [place] but now no longer has that option. Feel she needs SNF but pt adamantly refusing SNF. If she chooses to return home will need maximum [facility] services and she really needs 24 hour assist.     Follow Up Recommendations SNF;Supervision/Assistance - 24 hour (Pt adamantly refusing SNF. Will need to find 24 assist and max HH services)    Equipment Recommendations  None recommended by PT    Recommendations for Other Services       Precautions / Restrictions Precautions Precautions: Fall      Mobility  Bed Mobility Overal bed mobility: Needs Assistance Bed Mobility: Supine to Sit     Supine to sit: Supervision;HOB elevated     General bed mobility comments: supervision for lines/safety.  Transfers Overall transfer level: Needs assistance Equipment used: Rolling walker (2 wheeled) Transfers: Sit to/from [facility] Sit to Stand: +2 physical assistance;Min assist Stand pivot transfers: +2 physical assistance;Min assist       General transfer comment: Assist to bring hips up and for balance. Pt very tremulous and fatigued quickly.  Ambulation/Gait Ambulation/Gait assistance: +2 physical assistance;Min assist Gait Distance (Feet): 3 Feet Assistive device: Rolling walker (2 wheeled) Gait Pattern/deviations: Step-through pattern;Decreased step length - right;Decreased step length - left;Shuffle;Trunk flexed Gait velocity: decr Gait velocity  interpretation: <1.31 ft/sec, indicative of household ambulator General Gait Details: Assist for balance and support. Pt shaky and fatigued quickly and had chair brought to her to sit.   Stairs            Wheelchair Mobility    Modified Rankin (Stroke Patients Only)       Balance Overall balance assessment: Needs assistance Sitting-balance support: No upper extremity supported;Feet supported Sitting balance-Leahy Scale: Good     Standing balance support: Bilateral upper extremity supported Standing balance-Leahy Scale: Poor Standing balance comment: walker and min assist                             Pertinent Vitals/Pain Pain Assessment: No/denies pain    Home Living Family/patient expects to be discharged to:: Private residence Living Arrangements: Alone Available Help at Discharge: Family;Available PRN/intermittently Type of Home: Apartment Home Access: Level entry     Home Layout: One level Home Equipment: Walker - 2 wheels;Cane - single point;Bedside commode      Prior Function Level of Independence: Independent               Hand Dominance   Dominant Hand: Right    Extremity/Trunk Assessment   Upper Extremity Assessment Upper Extremity Assessment: Defer to OT evaluation    Lower Extremity Assessment Lower Extremity Assessment: Generalized weakness       Communication   Communication: No difficulties  Cognition Arousal/Alertness: Awake/alert Behavior During Therapy: WFL for tasks assessed/performed Overall Cognitive Status: Within Functional Limits for tasks assessed  General Comments: At times seemed a little slowed      General Comments General comments (skin integrity, edema, etc.): Pt on 2L with SpO2 >92%.     Exercises     Assessment/Plan    PT Assessment Patient needs continued PT services  PT Problem List Decreased strength;Decreased activity tolerance;Decreased  balance;Decreased mobility;Cardiopulmonary status limiting activity       PT Treatment Interventions DME instruction;Gait training;Functional mobility training;Therapeutic activities;Therapeutic exercise;Balance training;Patient/family education    PT Goals (Current goals can be found in the Care Plan section)  Acute Rehab PT Goals Patient Stated Goal: go home PT Goal Formulation: With patient Time For Goal Achievement: 09/04/19 Potential to Achieve Goals: Good    Frequency Min 3X/week   Barriers to discharge Decreased caregiver support Lives alone    Co-evaluation PT/OT/SLP Co-Evaluation/Treatment: Yes Reason for Co-Treatment: For patient/therapist safety;Other (comment) (Pt with limited endurance to tolerate therapy session) PT goals addressed during session: Mobility/safety with mobility;Balance         AM-PAC PT "6 Clicks" Mobility  Outcome Measure Help needed turning from your back to your side while in a flat bed without using bedrails?: None Help needed moving from lying on your back to sitting on the side of a flat bed without using bedrails?: A Little Help needed moving to and from a bed to a chair (including a wheelchair)?: A Little Help needed standing up from a chair using your arms (e.g., wheelchair or bedside chair)?: A Little Help needed to walk in hospital room?: A Lot Help needed climbing 3-5 steps with a railing? : Total 6 Click Score: 16    End of Session Equipment Utilized During Treatment: Gait belt;Oxygen Activity Tolerance: Patient limited by fatigue Patient left: in chair;with call bell/phone within reach;with chair alarm set   PT Visit Diagnosis: Unsteadiness on feet (R26.81);Other abnormalities of gait and mobility (R26.89);Muscle weakness (generalized) (M62.81)    Time: 3570-1779 PT Time Calculation (min) (ACUTE ONLY): 33 min   Charges:   PT Evaluation $PT Eval Moderate Complexity: [place] Pager [phone] Office [place] 08/21/2019, 12:05 PM

## 2019-08-21 NOTE — Progress Notes (Signed)
eLink Physician-Brief Progress Note Patient Name: Jerusalen Mateja DOB: 03/23/1955 MRN: [id number]   Date of Service  08/21/2019  HPI/Events of Note  History of tobacco abuse - Request for Nicotine patch.   eICU Interventions  Plan: 1. Nicotine patch 21 mg to skin now and Q day.      Intervention Category Major Interventions: Other:  Lysle Dingwall 08/21/2019, 8:30 PM

## 2019-08-21 NOTE — Progress Notes (Addendum)
NAME:  Chelsea Allison, MRN:  [id number], DOB:  September 13, 1955, LOS: 61 ADMISSION DATE:  08/10/2019, CONSULTATION DATE:  08/12/2019 REFERRING MD:  Dr. Posey Pronto, CHIEF COMPLAINT:  Respiratory failure    Brief History   64yo f with a PMHx of CAD/STEMI in Oct. 2020 (s/p PCI), COPD on home O2, NSCLC s/p partial lung resection, CHF presented with shortness of breath and elevated troponin. Negative cardiac cath and treated for acute COPD exasperation with improvement. Admitted to ICU on 6/5 and transferred out following improvement. Decompensated with acute mental status change and respiratory distress on 6/7. Reintubated and transferred to ICU.  Past Medical History  Tobacco abuse NSTEMI 12/12/2018 status post PCI Neuropathy Type 2 diabetes Ischemic cardiomyopathy Hypertension Hyperlipidemia COPD Colon cancer Lung cancer CAD with drug-eluting stent times 09 December 2018  [facility] Events   6/4 - admitted 6/5 - Pt became unresponsive, obtunded, in significant respiratory distress. Transferred to ICU and intubated.  6/6 - Extubated and transferred out of ICU.  6/7 - Pt in distress and acutely tachypneic, found to be in hypercarbic resp failure. Reintubated and readmitted to ICU 6/8 - clinically improving, weaning ventilator. Extubated, developed worsening wheezing, respiratory distress, and acidosis. Started on BiPAP and later intubated.  6/12 extubated  Consults:  PCCM  Procedures:  6/5 - Intubation (extubated 6/6) 6/7 - Re-intubated (extubated 6/8) 6/8 - Re-intubated  Significant Diagnostic Tests:  CTA chest 6/4> No central, segmental, or subsegmental pulmonary embolism. Mild ground-glass tree-in-bud opacities seen throughout both lungs (infectious vs. Inflammatory). Stable ill-defined pulmonary nodules in the posterior left lower lobe and right middle lobe.  Chest x-ray 6/5 > ETT and NGT in place. Developing LEFT perihilar airspace filling opacities consistent with infiltrate or  edema.  Head CT 6/5 > No evidence for acute intracranial abnormality.  Echo 6/5 > estimated LVEF 35-40% with mild concentric LVH.   Bronchoscopy 6/8 > tracheobronchomalacia, mucus plugs  Repeat bronchoscopy 6/10 > Significant airway distortion and stenosis of the R airways likely due to fibrosis s/p RUL lobectomy and radiation  Micro Data:  Covid 6/4 > negative Respiratory virus panel 6/4 >  Blood culture 6/4 > no growth 3 days MRSA PCR 6/4> negative Respiratory culture 6/5 > few Staph aureus Bronchial brushing 6/8> enterococcus faecalis  BAL 6/10> GPC in chains   Antimicrobials:  Azithromycin 6/4  Ceftriaxone 6/4 > 6/5. Restarted 6/6 Cefepime 6/5 > 6/6 Vancomycin 6/5 > 6/6, 6/10>>   Interim history/subjective:   Resting comfortably in bed on 2L [place]. No complaints. Used BiPAP briefly overnight.   Objective   Blood pressure 117/62, pulse 70, temperature 98.1 F (36.7 C), resp. rate (!) 27, height _0  (1.753 m), weight 81.2 kg, SpO2 95 %.    FiO2 (%):  [40 %] 40 %   Intake/Output Summary (Last 24 hours) at 08/21/2019 0952 Last data filed at 08/21/2019 0800 Gross per 24 hour  Intake 509.64 ml  Output 1950 ml  Net -1440.36 ml   Filed Weights   08/19/19 0645 08/20/19 0500 08/21/19 0700  Weight: 80.3 kg 80.8 kg 81.2 kg   General: Elderly appearing female resting comfortably in bed HEENT: [place]/AT, PERRL, no JVD CV: RRR, no MRG PULM: Transmitted upper airway sounds. Lung fields sound clear.  GI: Soft, non-tender, non-distended Extremities: no sig edema, some muscle wasting present Skin: Grossly intact Neuro: Alert oriented following commands  Resolved Hospital Problem list   AKI  Assessment & Plan:   Acute on chronic hypoxic and hypercapnic respiratory failure Admitted 6/4  with CAP and COPD exacerbation and intubated. Treated and extubated with sudden worsening on 6/7, requiring transfer back to the ICU and re-intubation. Pt extubated 6/8 and reintubated again due  to acidosis and respiratory distress.  -Bronchoscopy on 6/8 showed tracheobronchomalacia. -Enterococcus faecalis on Bronch brushing -Extubated 6/12 P: - Brovana, Pulmicort, Yupelri, as needed albuterol - BiPAP as needed - Transfer to telemetry unit - [facility]  CAD s/p PCIx2 in 2020 HFrEF Cardiac cath this admission with chronic LAD blockage, no significant new lesions. EF worsened to 30-35% from 45-50% 6 months ago.  P -Lasix 40 mg daily -Brilinta, aspirin, statin  Hx Essential HTN  Home medications include Lasix, Cozaar, metoprolol P -Currently holding, can reintroduce as necessary.  Type 2 DM. Elevated BG in the setting of steroid use, now improving. -SSI plus Levemir  Deconditioning - OOB to chair, ambulate as able - PT consult  Opioid dependence: on buprenorphine patch at home - monitor - Holding patch  Best practice:   Diet: heart healthy Pain/Anxiety/Delirium protocol (if indicated): OFF VAP protocol (if indicated): NA, aspiration precautions DVT prophylaxis: lovenox GI prophylaxis: PPI Glucose control: Sliding scale insulin, Levemir BID Mobility: Bedrest Code Status: Full code Family Communication: We will update patient's family. Disposition: ICU    Georgann Housekeeper, AGACNP-BC [place] for personal pager PCCM on call pager [phone]  08/21/2019 10:01 AM    PCCM attending:  64 year old female past medical history of CAD, history of a STEMI in October 2020 status post PCI, COPD oxygen dependent history of lung cancer.  Prolonged hospitalizationrecurrent intubation due to respiratory failure hypercapnia.  Since last extubation patient doing well.  BP (!) 125/113 (BP Location: Right Arm)   Pulse 84   Temp (!) 97.4 F (36.3 C)   Resp (!) 31   Ht _0  (1.753 m)   Wt 81.2 kg   SpO2 94%   BMI 26.44 kg/m   General: Comfortable sitting up in chair no distress HEENT NCAT sclera clear tracking  appropriately Heart: Regular rhythm S1-S2 Lungs: Diminished bilaterally Abdomen: Soft nontender  Labs: Reviewed  Assessment: Acute hypoxemic and hypercarbic respiratory failure, resolved Chronic hypoxemic respiratory failure on nasal cannula O2 supplementation Acute exacerbation of COPD, resolving Recurrent intubation, x4 during this hospitalization.  Plan: Continue scheduled nebulizers Brovana, Pulmicort, Yupelri and as needed albuterol -Important for her to use BiPAP nightly naps Stable for transfer to the floor Continue PT OT Continue Lasix daily to maintain euvolemia Brilinta aspirin and statin  Pulmonary service will follow for COPD management  Garner Nash, DO [facility] 08/21/2019 5:57 PM

## 2019-08-21 NOTE — Progress Notes (Signed)
Nutrition Follow up  DOCUMENTATION CODES:   Not applicable  INTERVENTION:    Ensure Enlive po TID, each supplement provides 350 kcal and 20 grams of protein  MVI daily   NUTRITION DIAGNOSIS:   Increased nutrient needs related to acute illness as evidenced by estimated needs.  Ongoing  GOAL:   Patient will meet greater than or equal to 90% of their needs   Progressing   MONITOR:   Weight trends, Labs, I & O's, Vent status, Skin, TF tolerance  REASON FOR ASSESSMENT:   Ventilator    ASSESSMENT:   Patient with PMH significant for CAD/STEMI, s/p PCI, COPD, HTN, HLD, CHF, non-small lung cancer stg III s/p partial resection, and anal cancer s/p colostomy. Presents this admission with COPD exacerbation.   6/5- intubation 6/6- extubation 6/7- re- intubation 6/8- extubation, re-intubation 6/12- extubated   Pt discussed during ICU rounds and with RN.   Diet advanced to regular texture thin liquids yesterday. Appetite slow to progress per pt. Last meal completion charted as 50%. Discussed the importance of protein intake for preservation of lean body mass.Pt willing to try Ensure to maximize kcal and protein.   Pt denies loss in appetite PTA. Typically eats three meals daily that consist of good protein sources. Reports a UBW of 180 lb and denies unintentional wt loss.   Admission weight: 83.9 kg  Current weight: 81.2 kg   I/O: -4,767 ml since admit UOP: 2,000 ml x 24 hrs   Medications: colace, 40 mg lasix daily, SS novolog, levemir, solumedrol, 40 mEq KCl daily Labs: CBG 113-170  Diet Order:   Diet Order            Diet heart healthy/carb modified Room service appropriate? Yes; Fluid consistency: Thin  Diet effective now                 EDUCATION NEEDS:   Not appropriate for education at this time  Skin:  Skin Assessment: Reviewed RN Assessment  Last BM:  6/14  Height:   Ht Readings from Last 1 Encounters:  08/12/19 5\' 9"  (1.753 m)    Weight:    Wt Readings from Last 1 Encounters:  08/21/19 81.2 kg    BMI:  Body mass index is 26.44 kg/m.  Estimated Nutritional Needs:   Kcal:  1800-2000 kcal  Protein:  90-105 grams  Fluid:  >/= 1.8 L/day   Mariana Single RD, LDN Clinical Nutrition Pager listed in [place]

## 2019-08-21 NOTE — Plan of Care (Signed)
  Problem: Clinical Measurements: Goal: Ability to maintain clinical measurements within normal limits will improve Outcome: Progressing Goal: Will remain free from infection Outcome: Progressing Goal: Diagnostic test results will improve Outcome: Progressing Goal: Respiratory complications will improve Outcome: Progressing Goal: Cardiovascular complication will be avoided Outcome: Progressing   Problem: Activity: Goal: Risk for activity intolerance will decrease Outcome: Not Progressing   Problem: Nutrition: Goal: Adequate nutrition will be maintained Outcome: Progressing   Problem: Coping: Goal: Level of anxiety will decrease Outcome: Progressing   Problem: Elimination: Goal: Will not experience complications related to bowel motility Outcome: Completed/Met Goal: Will not experience complications related to urinary retention Outcome: Completed/Met   Problem: Pain Managment: Goal: General experience of comfort will improve Outcome: Progressing   Problem: Safety: Goal: Ability to remain free from injury will improve Outcome: Progressing   Problem: Skin Integrity: Goal: Risk for impaired skin integrity will decrease Outcome: Progressing   Problem: Activity: Goal: Ability to tolerate increased activity will improve Outcome: Progressing Goal: Will verbalize the importance of balancing activity with adequate rest periods Outcome: Progressing   Problem: Respiratory: Goal: Ability to maintain a clear airway will improve Outcome: Progressing Goal: Levels of oxygenation will improve Outcome: Progressing Goal: Ability to maintain adequate ventilation will improve Outcome: Progressing   Problem: Activity: Goal: Ability to tolerate increased activity will improve Outcome: Not Progressing Note: Pt is weak following respiratory complications and other medical issues, needs PT/OT and rehab--ordered   Problem: Cardiac: Goal: Ability to achieve and maintain adequate  cardiopulmonary perfusion will improve Outcome: Progressing Goal: Vascular access site(s) Level 0-1 will be maintained Outcome: Completed/Met

## 2019-08-22 LAB — CBC
HCT: 43.3 % (ref 36.0–46.0)
Hemoglobin: 13.6 g/dL (ref 12.0–15.0)
MCH: 25.9 pg — ABNORMAL LOW (ref 26.0–34.0)
MCHC: 31.4 g/dL (ref 30.0–36.0)
MCV: 82.3 fL (ref 80.0–100.0)
Platelets: 213 10*3/uL (ref 150–400)
RBC: 5.26 MIL/uL — ABNORMAL HIGH (ref 3.87–5.11)
RDW: 14.6 % (ref 11.5–15.5)
WBC: 25.4 10*3/uL — ABNORMAL HIGH (ref 4.0–10.5)
nRBC: 0 % (ref 0.0–0.2)

## 2019-08-22 LAB — COMPREHENSIVE METABOLIC PANEL
ALT: 33 U/L (ref 0–44)
AST: 21 U/L (ref 15–41)
Albumin: 3.3 g/dL — ABNORMAL LOW (ref 3.5–5.0)
Alkaline Phosphatase: 52 U/L (ref 38–126)
Anion gap: 10 (ref 5–15)
BUN: 47 mg/dL — ABNORMAL HIGH (ref 8–23)
CO2: 26 mmol/L (ref 22–32)
Calcium: 9.7 mg/dL (ref 8.9–10.3)
Chloride: 96 mmol/L — ABNORMAL LOW (ref 98–111)
Creatinine, Ser: 0.99 mg/dL (ref 0.44–1.00)
GFR calc Af Amer: >60 mL/min (ref >60)
GFR calc non Af Amer: >60 mL/min (ref >60)
Glucose, Bld: 127 mg/dL — ABNORMAL HIGH (ref 70–99)
Potassium: 4.7 mmol/L (ref 3.5–5.1)
Sodium: 132 mmol/L — ABNORMAL LOW (ref 135–145)
Total Bilirubin: 0.7 mg/dL (ref 0.3–1.2)
Total Protein: 6.2 g/dL — ABNORMAL LOW (ref 6.5–8.1)

## 2019-08-22 LAB — GLUCOSE, CAPILLARY
Glucose-Capillary: 124 mg/dL — ABNORMAL HIGH (ref 70–99)
Glucose-Capillary: 155 mg/dL — ABNORMAL HIGH (ref 70–99)
Glucose-Capillary: 115 mg/dL — ABNORMAL HIGH (ref 70–99)
Glucose-Capillary: 137 mg/dL — ABNORMAL HIGH (ref 70–99)

## 2019-08-22 LAB — MAGNESIUM: Magnesium: 2.6 mg/dL — ABNORMAL HIGH (ref 1.7–2.4)

## 2019-08-22 MED ORDER — PREDNISONE 10 MG PO TABS: 10.0000 mg | ORAL_TABLET | Freq: Every day | ORAL | Status: DC

## 2019-08-22 MED ORDER — PREDNISONE 20 MG PO TABS: 30.0000 mg | ORAL_TABLET | Freq: Every day | ORAL | Status: DC

## 2019-08-22 MED ORDER — METOPROLOL SUCCINATE ER 25 MG PO TB24
25.0000 mg | ORAL_TABLET | Freq: Every day | ORAL | Status: DC
  Administered 2019-08-22 – 2019-08-25 (×4): 25 mg via ORAL
  Filled 2019-08-22 (×4): qty 1

## 2019-08-22 MED ORDER — PREDNISONE 20 MG PO TABS
40.0000 mg | ORAL_TABLET | Freq: Every day | ORAL | Status: AC
  Administered 2019-08-24 – 2019-08-25 (×2): 40 mg via ORAL
  Filled 2019-08-22 (×2): qty 2

## 2019-08-22 MED ORDER — LOSARTAN POTASSIUM 25 MG PO TABS
12.5000 mg | ORAL_TABLET | Freq: Every day | ORAL | Status: DC
  Administered 2019-08-22 – 2019-08-25 (×4): 12.5 mg via ORAL
  Filled 2019-08-22 (×4): qty 1

## 2019-08-22 MED ORDER — METHYLPREDNISOLONE SODIUM SUCC 125 MG IJ SOLR: 60.0000 mg | Freq: Two times a day (BID) | INTRAMUSCULAR | Status: AC

## 2019-08-22 MED ORDER — BUPRENORPHINE 20 MCG/HR TD PTWK
1.0000 | MEDICATED_PATCH | TRANSDERMAL | Status: DC
  Administered 2019-08-22: 1 via TRANSDERMAL

## 2019-08-22 MED ORDER — POTASSIUM CHLORIDE 20 MEQ PO PACK
40.0000 meq | PACK | Freq: Every day | ORAL | Status: DC
  Administered 2019-08-22 – 2019-08-25 (×4): 40 meq via ORAL
  Filled 2019-08-22 (×4): qty 2

## 2019-08-22 MED ORDER — PREDNISONE 20 MG PO TABS: 20.0000 mg | ORAL_TABLET | Freq: Every day | ORAL | Status: DC

## 2019-08-22 MED ORDER — PREDNISONE 20 MG PO TABS: 40.0000 mg | ORAL_TABLET | Freq: Every day | ORAL | Status: DC

## 2019-08-22 MED ORDER — METHYLPREDNISOLONE SODIUM SUCC 125 MG IJ SOLR
60.0000 mg | Freq: Every day | INTRAMUSCULAR | Status: AC
  Administered 2019-08-23: 60 mg via INTRAVENOUS
  Filled 2019-08-22: qty 2

## 2019-08-22 MED ORDER — PANTOPRAZOLE SODIUM 40 MG PO TBEC: 40.0000 mg | DELAYED_RELEASE_TABLET | Freq: Every day | ORAL | Status: DC

## 2019-08-22 NOTE — Progress Notes (Addendum)
NAME:  Chelsea Allison, MRN:  [id number], DOB:  May 25, 1955, LOS: 68 ADMISSION DATE:  08/10/2019, CONSULTATION DATE:  08/12/2019 REFERRING MD:  Dr. Posey Pronto, CHIEF COMPLAINT:  Respiratory failure    Brief History   64yo f with a PMHx of CAD/STEMI in Oct. 2020 (s/p PCI), COPD on home O2, NSCLC s/p partial lung resection, CHF presented with shortness of breath and elevated troponin. Negative cardiac cath and treated for acute COPD exasperation with improvement. Admitted to ICU on 6/5 and transferred out following improvement. Decompensated with acute mental status change and respiratory distress on 6/7. Reintubated and transferred to ICU.  Past Medical History  Tobacco abuse NSTEMI 12/12/2018 status post PCI Neuropathy Type 2 diabetes Ischemic cardiomyopathy Hypertension Hyperlipidemia COPD Colon cancer Lung cancer CAD with drug-eluting stent times 09 December 2018  [facility] Events   6/4 - admitted 6/5 - Pt became unresponsive, obtunded, in significant respiratory distress. Transferred to ICU and intubated.  6/6 - Extubated and transferred out of ICU.  6/7 - Pt in distress and acutely tachypneic, found to be in hypercarbic resp failure. Reintubated and readmitted to ICU 6/8 - clinically improving, weaning ventilator. Extubated, developed worsening wheezing, respiratory distress, and acidosis. Started on BiPAP and later intubated.  6/12 extubated  Consults:  PCCM  Procedures:  6/5 - Intubation (extubated 6/6) 6/7 - Re-intubated (extubated 6/8) 6/8 - Re-intubated  Significant Diagnostic Tests:  CTA chest 6/4> No central, segmental, or subsegmental pulmonary embolism. Mild ground-glass tree-in-bud opacities seen throughout both lungs (infectious vs. Inflammatory). Stable ill-defined pulmonary nodules in the posterior left lower lobe and right middle lobe.  Chest x-ray 6/5 > ETT and NGT in place. Developing LEFT perihilar airspace filling opacities consistent with infiltrate or  edema.  Head CT 6/5 > No evidence for acute intracranial abnormality.  Echo 6/5 > estimated LVEF 35-40% with mild concentric LVH.   Bronchoscopy 6/8 > tracheobronchomalacia, mucus plugs  Repeat bronchoscopy 6/10 > Significant airway distortion and stenosis of the R airways likely due to fibrosis s/p RUL lobectomy and radiation  Micro Data:  Covid 6/4 > negative Respiratory virus panel 6/4 >  Blood culture 6/4 > no growth 3 days MRSA PCR 6/4> negative Respiratory culture 6/5 > few Staph aureus Bronchial brushing 6/8> enterococcus faecalis  BAL 6/10> GPC in chains   Antimicrobials:  Azithromycin 6/4  Ceftriaxone 6/4 > 6/5. Restarted 6/6 > 613 Cefepime 6/5 > 6/6 Vancomycin 6/5 > 6/6, 6/10>> 6/13 Augmentin 6/13 >  Interim history/subjective:   Resting comfortably in bed on 2L [place]. No complaints. Used BiPAP briefly overnight.   Objective   Blood pressure 133/66, pulse 82, temperature 98.6 F (37 C), temperature source Oral, resp. rate (!) 23, height _0  (1.753 m), weight 79.9 kg, SpO2 99 %.    FiO2 (%):  [2 %] 2 %   Intake/Output Summary (Last 24 hours) at 08/22/2019 0934 Last data filed at 08/22/2019 0500 Gross per 24 hour  Intake --  Output 2250 ml  Net -2250 ml   Filed Weights   08/20/19 0500 08/21/19 0700 08/22/19 0500  Weight: 80.8 kg 81.2 kg 79.9 kg   General: Elderly appearing female in BED HEENT: [place]/AT, PERRL, no JVD CV: RRR, no MRG PULM: Clear GI: Soft, non-tender, non-distended Extremities: NO significant edema. No acute deformity Skin: Alert, oriented, non-focal.   Resolved Hospital Problem list   AKI  Assessment & Plan:   Acute on chronic hypoxic and hypercapnic respiratory failure: Admitted 6/4 with CAP and COPD exacerbation and intubated.  Treated and extubated with sudden worsening on 6/7, requiring transfer back to the ICU and re-intubation. Pt extubated 6/8 and reintubated again due to acidosis and respiratory distress.  -Bronchoscopy on 6/8  showed tracheobronchomalacia. -Enterococcus faecalis on Bronch brushing 6/10. -Extubated 6/12 P: - Brovana, Pulmicort, Yupelri, as needed albuterol - Transition Solumedrol to prednisone and taper over 2 week period.  - Will try her without BiPAP tonight and assess ABG in AM. She has barely been wearing it anyway. No clear indication for her to discharge on in unless ABG in the AM shows respiratory acidosis.  - Augmentin for 7 day course post enterococcus on BAL. Stop date 6/17. - Have encouraged her to consider rehab facility as has been recommended by PT/OT - [facility]   Georgann Housekeeper, AGACNP-BC [place] for personal pager PCCM on call pager [phone]  08/22/2019 9:34 AM   Pulmonary critical care attending:  64 year old severe COPD recurrent elevations during this hospitalization for hypercapnic and hypoxic respiratory failure.  At this time with significant improvement.  Awaiting transfer out of the intensive care unit.  No issues overnight.  BP 116/67 (BP Location: Right Arm)   Pulse 74   Temp 98.7 F (37.1 C) (Oral)   Resp 20   Ht _0  (1.753 m)   Wt 79.9 kg   SpO2 98%   BMI 26.01 kg/m   General: Elderly female resting comfortably in bed. HEENT: Tracking appropriately Heart: Regular rhythm S1-S2 Lungs: Diminished breath sound bilaterally  Assessment:  Acute on chronic hypoxemic hypercarbic respiratory failure Severe COPD Enterococcus, pneumonia  Plan: Continue Brovana, Pulmicort, Yupelri As needed albuterol BiPAP nightly and with naps 7 days Augmentin complete 08/24/2019 Agree with consideration for rehabilitation Appreciate PT OT input Up in chair out of bed as much tolerated.  Pulmonary will follow for COPD management.  I did consider BiPAP therapy at discharge for hypercapnic respiratory failure.   [facility] 08/22/2019 5:59 PM

## 2019-08-22 NOTE — Progress Notes (Signed)
 PROGRESS NOTE  Chelsea Allison MRN:[id number] DOB: 12/02/1955 DOA: 08/10/2019 PCP: Gassemi, Mike, MD  HPI/Recap of past 24 hours: HPI from PCCM 64 yo female with a PMHx of CAD/STEMI in Oct. 2020 (s/p PCI), COPD on home O2, NSCLC s/p partial lung resection, CHF presented with shortness of breath and elevated troponin with negative cardiac cath. Admitted to ICU, intubated on 6/5 and extubated on 6/6. Decompensated again on 6/7, intubated and extubated on 6/8 with another intubation on the same day 6/8 due to resp distress. Pt remained on MV and finally extubated on 6/12 and has remained stable since then. Pt was transferred out of the ICU and [facility] assumed care on 08/22/19.    Today, pt reports feeling very fatigued, with some generalized bodyaches, denies any worsening SOB, chest pain, fever/chills, abdominal pain, diarrhea.    Assessment/Plan: Principal Problem:   Acute respiratory failure (HCC) Active Problems:   COPD with acute exacerbation (HCC)   CAP (community acquired pneumonia)   CHF exacerbation (HCC)   AKI (acute kidney injury) (HCC)   Elevated troponin   Acute on chronic hypoxic and hypercapnic respiratory failure CAP Intubated x3 due to recurrent respiratory distress, finally extubated on 6/12 Currently on 2 L of oxygen, saturating well, noted intermittent tachypnea Currently afebrile, with leukocytosis (on steroids) Status post bronchoscopy on 6/8 which showed tracheobronchomalacia, mucous plugs, some fibrosis Bronchial brushing done on 6/8 grew Enterococcus faecalis, bowel 6/10 grew GPC in chains Continue p.o. Augmentin, last dose on 6/17 Continue nebulizer, inhalers, steroids (taper over 2-weeks) Continue supplemental oxygen, BiPAP as needed, incentive spirometry, flutter valve PCCM following, monitor closely  CAD status post PCI x2 in 2020 Acute on chronic systolic HF Elevated troponin Currently chest pain-free Elevated troponin on admission Cardiac catheter this  admission showed chronic LAD blockage, no significant new lesions, EF worsened to 30 to 35% Cardiology consulted, suspected demand ischemia due to COPD exacerbation Recommend to continue aspirin, Crestor, Brilinta, beta-blocker, ARB, outpatient follow-up Continue Lasix, metoprolol, losartan, Crestor, Brilinta  Hypertension Continue losartan, metoprolol  Diabetes mellitus type 2 A1c 5.8 on 08/11/19 SSI, Lantus, Accu-Cheks, hypoglycemic protocol  GERD Start home PPI  Opioid dependence Restart buprenorphine patch  Deconditioning PT/OT-recommend SNF, currently refusing Encourage patient to think about SNF placement due to current state         Malnutrition Type:  Nutrition Problem: Increased nutrient needs Etiology: acute illness   Malnutrition Characteristics:  Signs/Symptoms: estimated needs   Nutrition Interventions:  Interventions: Tube feeding    Estimated body mass index is 26.01 kg/m as calculated from the following:   Height as of this encounter: 5' 9" (1.753 m).   Weight as of this encounter: 79.9 kg.     Code Status: Full  Family Communication: Discussed extensively with patient  Disposition Plan: Status is: Inpatient  Remains inpatient appropriate because:Inpatient level of care appropriate due to severity of illness   Dispo: The patient is from: Home              Anticipated d/c is to: Home vs SNF              Anticipated d/c date is: 2 days              Patient currently is not medically stable to d/c.     Consultants:  PCCM  Cardiology  Procedures:  Mechanical ventilation x3  Bronchoscopy  Antimicrobials:  Augmentin  DVT prophylaxis: Lovenox   Objective: Vitals:   08/22/19 1100 08/22/19 1146 08/22/19 1200 08/22/19   1240  BP:      Pulse: 84  87 89  Resp: (!) 32  20 (!) 33  Temp:  99.1 F (37.3 C)    TempSrc:  Oral    SpO2: 94%  95% 97%  Weight:      Height:        Intake/Output Summary (Last 24 hours) at  08/22/2019 1418 Last data filed at 08/22/2019 0500 Gross per 24 hour  Intake --  Output 1300 ml  Net -1300 ml   Filed Weights   08/20/19 0500 08/21/19 0700 08/22/19 0500  Weight: 80.8 kg 81.2 kg 79.9 kg    Exam:  General: NAD, deconditioned   Cardiovascular: S1, S2 present  Respiratory:  Diminished breath sounds bilaterally  Abdomen: Soft, nontender, nondistended, bowel sounds present  Musculoskeletal: No bilateral pedal edema noted  Skin: Normal  Psychiatry: Normal mood   Data Reviewed: CBC: Recent Labs  Lab 08/16/19 0222 08/16/19 0222 08/16/19 1205 08/18/19 0241 08/19/19 0203 08/20/19 0343 08/22/19 0357  WBC 10.4  --   --  15.0* 16.5* 22.2* 25.4*  HGB 11.3*   < > 11.9* 11.8* 12.8 12.8 13.6  HCT 37.6   < > 35.0* 39.6 43.4 43.0 43.3  MCV 86.4  --   --  88.2 87.3 85.8 82.3  PLT 228  --   --  220 213 231 213   < > = values in this interval not displayed.   Basic Metabolic Panel: Recent Labs  Lab 08/16/19 0222 08/16/19 0222 08/16/19 1205 08/18/19 0241 08/19/19 0203 08/20/19 0343 08/22/19 0357  NA 146*   < > 144 144 144 135 132*  K 4.1   < > 3.9 3.8 4.7 4.2 4.7  CL 110  --   --  112* 106 96* 96*  CO2 23  --   --  _0 GLUCOSE 140*  --   --  199* 223* 141* 127*  BUN 52*  --   --  54* 64* 54* 47*  CREATININE 1.18*  --   --  1.10* 1.24* 0.98 0.99  CALCIUM 9.3  --   --  8.9 8.5* 8.8* 9.7  MG 2.7*  --   --  2.7*  --   --  2.6*  PHOS 1.6*  --   --  3.2  --   --   --    < > = values in this interval not displayed.   GFR: Estimated Creatinine Clearance: 65.8 mL/min (by C-G formula based on SCr of 0.99 mg/dL). Liver Function Tests: Recent Labs  Lab 08/20/19 0343 08/22/19 0357  AST 19 21  ALT 24 33  ALKPHOS 49 52  BILITOT 0.9 0.7  PROT 6.3* 6.2*  ALBUMIN 3.3* 3.3*   No results for input(s): LIPASE, AMYLASE in the last 168 hours. No results for input(s): AMMONIA in the last 168 hours. Coagulation Profile: No results for input(s): INR,  PROTIME in the last 168 hours. Cardiac Enzymes: No results for input(s): CKTOTAL, CKMB, CKMBINDEX, TROPONINI in the last 168 hours. BNP (last 3 results) No results for input(s): PROBNP in the last 8760 hours. HbA1C: No results for input(s): HGBA1C in the last 72 hours. CBG: Recent Labs  Lab 08/21/19 1117 08/21/19 1516 08/21/19 2123 08/22/19 0626 08/22/19 1142  GLUCAP 139* 202* 212* 124* 155*   Lipid Profile: No results for input(s): CHOL, HDL, LDLCALC, TRIG, CHOLHDL, LDLDIRECT in the last 72 hours. Thyroid Function Tests: No results for input(s): TSH, T4TOTAL, FREET4, T3FREE,  THYROIDAB in the last 72 hours. Anemia Panel: Recent Labs    08/20/19 0343  FERRITIN 67   Urine analysis: No results found for: COLORURINE, APPEARANCEUR, LABSPEC, PHURINE, GLUCOSEU, HGBUR, BILIRUBINUR, KETONESUR, PROTEINUR, UROBILINOGEN, NITRITE, LEUKOCYTESUR Sepsis Labs: _0 (procalcitonin:4,lacticidven:4)  ) Recent Results (from the past 240 hour(s))  Culture, respiratory     Status: None   Collection Time: 08/15/19  7:18 PM   Specimen: Bronchial Brush; Respiratory  Result Value Ref Range Status   Specimen Description BRONCHIAL BRUSHING  Final   Special Requests LEFT  Final   Gram Stain   Final    RARE WBC PRESENT, PREDOMINANTLY PMN NO ORGANISMS SEEN Performed at [facility], [address], [place], [place] [address]    Culture FEW ENTEROCOCCUS FAECALIS  Final   Report Status 08/18/2019 FINAL  Final   Organism ID, Bacteria ENTEROCOCCUS FAECALIS  Final      Susceptibility   Enterococcus faecalis - MIC*    AMPICILLIN <=2 SENSITIVE Sensitive     VANCOMYCIN 1 SENSITIVE Sensitive     GENTAMICIN SYNERGY SENSITIVE Sensitive     * FEW ENTEROCOCCUS FAECALIS  Culture, respiratory     Status: None   Collection Time: 08/17/19  7:32 PM   Specimen: Bronchoalveolar Lavage; Respiratory  Result Value Ref Range Status   Specimen Description BRONCHIAL ALVEOLAR LAVAGE  Final   Special  Requests NONE  Final   Gram Stain   Final    NO WBC SEEN RARE GRAM POSITIVE COCCI IN CHAINS IN PAIRS Performed at [facility], [address] [place], [place] [address]    Culture FEW ENTEROCOCCUS FAECALIS  Final   Report Status 08/21/2019 FINAL  Final   Organism ID, Bacteria ENTEROCOCCUS FAECALIS  Final      Susceptibility   Enterococcus faecalis - MIC*    AMPICILLIN <=2 SENSITIVE Sensitive     VANCOMYCIN 1 SENSITIVE Sensitive     GENTAMICIN SYNERGY SENSITIVE Sensitive     * FEW ENTEROCOCCUS FAECALIS      Studies: No results found.  Scheduled Meds: . amoxicillin-clavulanate  875 mg Oral Q12H  . arformoterol  15 mcg Nebulization BID  . aspirin EC  81 mg Oral Daily  . budesonide (PULMICORT) nebulizer solution  0.5 mg Nebulization BID  . chlorhexidine gluconate (MEDLINE KIT)  15 mL Mouth Rinse BID  . Chlorhexidine Gluconate Cloth  6 each Topical Daily  . docusate sodium  100 mg Oral BID  . enoxaparin (LOVENOX) injection  40 mg Subcutaneous Q24H  . feeding supplement (ENSURE ENLIVE)  237 mL Oral TID BM  . furosemide  40 mg Intravenous Daily  . guaiFENesin  600 mg Oral BID  . insulin aspart  3-9 Units Subcutaneous TID AC & HS  . insulin detemir  5 Units Subcutaneous BID  . methylPREDNISolone (SOLU-MEDROL) injection  60 mg Intravenous Q12H   Followed by  . [START ON 08/23/2019] methylPREDNISolone (SOLU-MEDROL) injection  60 mg Intravenous q1800  . midazolam  2 mg Intravenous Once  . multivitamin with minerals  1 tablet Oral Daily  . nicotine  21 mg Transdermal Daily  . pantoprazole  40 mg Oral QHS  . potassium chloride  40 mEq Oral Daily  . [START ON 08/24/2019] predniSONE  40 mg Oral Q breakfast   Followed by  . [START ON 08/26/2019] predniSONE  30 mg Oral Q breakfast   Followed by  . [START ON 08/28/2019] predniSONE  20 mg Oral Q breakfast   Followed by  . [  START ON 08/30/2019] predniSONE  10 mg Oral Q breakfast   Followed by  . [START ON 09/01/2019] predniSONE  10  mg Oral Q breakfast  . revefenacin  175 mcg Nebulization Daily  . rosuvastatin  20 mg Oral Daily  . ticagrelor  90 mg Oral BID    Continuous Infusions: . sodium chloride Stopped (08/19/19 1858)     LOS: 11 days     Alma Friendly, MD [facility]  If 7PM-7AM, please contact night-coverage [email or web address] 08/22/2019, 2:18 PM

## 2019-08-22 NOTE — Progress Notes (Signed)
Patient refusing to get to the chair for lunch as stated she would before. Patient is updating family herself via telephone, states that her sister is upset because she "isn't getting any better". Patient states "I'm depressed" and "I need something for pain" after this phone call. Patient denies any SI or HI, just tired of being in the hospital. Patient restates that she needs better pain medication for her generalized pain- will reach out to MD.

## 2019-08-22 NOTE — Progress Notes (Signed)
Attempted to call 4E report

## 2019-08-22 NOTE — Progress Notes (Signed)
Physical Therapy Treatment Patient Details Name: Chelsea Allison MRN: [id number] DOB: 24-Aug-1955 Today's Date: 08/22/2019    History of Present Illness Pt adm 6/4 with acute copd exacerbation. Respiratory distress on 6/5 and intubated 6/5-6/6. Reintubated 6/7- 6/8. Reintubated 6/8-6/12. PMH - NSTEMI, peripheral neuropathy, DM, HTN, COPD, colon CA, lung CA, CAD.     PT Comments    Pt pleasant and reports HA and lack of sleep. Pt with anxiety limiting mobility and reports that when she gets anxious she can't catch her breath and she is fearful of having another exacerbation and needing intubation and trach. Pt educated for current function, benefit of mobility to regain independence for return home. Pt with slowly improving gait and continue to recommend SNF as pt not yet able to demonstrate mobility sufficient for home but also feel if she can control her anxiety she can progress mobility well. Will continue to follow and encouraged OOB with nursing and to bathroom during day with assist.   Supine 131/58 (80) Sitting 133/66 (86) sats 96% on 2L throughout mobility    Follow Up Recommendations  SNF;Supervision/Assistance - 24 hour     Equipment Recommendations  None recommended by PT    Recommendations for Other Services       Precautions / Restrictions Precautions Precautions: Fall Precaution Comments: anxiety    Mobility  Bed Mobility Overal bed mobility: Modified Independent Bed Mobility: Supine to Sit;Sit to Supine     Supine to sit: Modified independent (Device/Increase time) Sit to supine: Modified independent (Device/Increase time)   General bed mobility comments: pt able to transition to and from supine without assist  Transfers Overall transfer level: Needs assistance   Transfers: Sit to/from Stand Sit to Stand: Min guard         General transfer comment: guarding to rise from bed, toilet and chair. Good hand placement with pt sitting quickly upon standing after  initial stand from bed, toilet and with repeated gait. Benefits from encouragement and reassurance to progress mobility  Ambulation/Gait Ambulation/Gait assistance: +2 physical assistance;Min assist Gait Distance (Feet): 14 Feet Assistive device: Rolling walker (2 wheeled) Gait Pattern/deviations: Step-through pattern;Decreased stride length   Gait velocity interpretation: <1.31 ft/sec, indicative of household ambulator General Gait Details: pt with encouragement to ambulate to bathroom. Pt walked 10', 10' 14' with seated rest after each trial and +2 for safety with close chair but benefits from visual target and options to sit rather with progression to chair follow to maximize distance   Stairs             Wheelchair Mobility    Modified Rankin (Stroke Patients Only)       Balance Overall balance assessment: Needs assistance Sitting-balance support: No upper extremity supported;Feet supported Sitting balance-Leahy Scale: Good Sitting balance - Comments: able to sit at toilet and eOB without assist   Standing balance support: Bilateral upper extremity supported Standing balance-Leahy Scale: Poor Standing balance comment: walker for standing. Partial left knee buckle at end of last gait trial with guarding to recover balance                            Cognition Arousal/Alertness: Awake/alert Behavior During Therapy: WFL for tasks assessed/performed Overall Cognitive Status: Within Functional Limits for tasks assessed  Exercises      General Comments        Pertinent Vitals/Pain Pain Assessment: 0-10 Pain Location: head Pain Descriptors / Indicators: Aching Pain Intervention(s): Limited activity within patient's tolerance;Monitored during session;RN gave pain meds during session;Repositioned    Home Living                      Prior Function            PT Goals (current goals can  now be found in the care plan section) Progress towards PT goals: Progressing toward goals    Frequency    Min 3X/week      PT Plan Current plan remains appropriate    Co-evaluation              AM-PAC PT "6 Clicks" Mobility   Outcome Measure  Help needed turning from your back to your side while in a flat bed without using bedrails?: None Help needed moving from lying on your back to sitting on the side of a flat bed without using bedrails?: None Help needed moving to and from a bed to a chair (including a wheelchair)?: A Little Help needed standing up from a chair using your arms (e.g., wheelchair or bedside chair)?: A Little Help needed to walk in hospital room?: A Lot Help needed climbing 3-5 steps with a railing? : Total 6 Click Score: 17    End of Session Equipment Utilized During Treatment: Gait belt;Oxygen Activity Tolerance: Patient limited by fatigue Patient left: in bed;with call bell/phone within reach;with nursing/sitter in room Nurse Communication: Mobility status PT Visit Diagnosis: Unsteadiness on feet (R26.81);Other abnormalities of gait and mobility (R26.89);Muscle weakness (generalized) (M62.81)     Time: 2202-5427 PT Time Calculation (min) (ACUTE ONLY): 38 min  Charges:  $Gait Training: 23-37 mins $Therapeutic Activity: 8-22 mins                     Chelsea Allison, PT Acute Rehabilitation Services Pager: [phone] Office: [place] 08/22/2019, 9:48 AM

## 2019-08-22 NOTE — Progress Notes (Signed)
Pt asked about rehab after d/c- states she is interested in staying at least a week before returning home w/ family friend to help care for her. Will pass along to day shift and encouraged pt to speak w/ rounding MD.

## 2019-08-23 ENCOUNTER — Telehealth: Payer: Self-pay | Admitting: Interventional Cardiology

## 2019-08-23 LAB — CBC WITH DIFFERENTIAL/PLATELET
Abs Immature Granulocytes: 0.83 10*3/uL — ABNORMAL HIGH (ref 0.00–0.07)
Basophils Absolute: 0.1 10*3/uL (ref 0.0–0.1)
Basophils Relative: 0 %
Eosinophils Absolute: 0.1 10*3/uL (ref 0.0–0.5)
Eosinophils Relative: 0 %
HCT: 44.3 % (ref 36.0–46.0)
Hemoglobin: 13.7 g/dL (ref 12.0–15.0)
Immature Granulocytes: 4 %
Lymphocytes Relative: 9 %
Lymphs Abs: 1.8 10*3/uL (ref 0.7–4.0)
MCH: 25.9 pg — ABNORMAL LOW (ref 26.0–34.0)
MCHC: 30.9 g/dL (ref 30.0–36.0)
MCV: 83.7 fL (ref 80.0–100.0)
Monocytes Absolute: 2.3 10*3/uL — ABNORMAL HIGH (ref 0.1–1.0)
Monocytes Relative: 11 %
Neutro Abs: 15.7 10*3/uL — ABNORMAL HIGH (ref 1.7–7.7)
Neutrophils Relative %: 76 %
Platelets: 253 10*3/uL (ref 150–400)
RBC: 5.29 MIL/uL — ABNORMAL HIGH (ref 3.87–5.11)
RDW: 15 % (ref 11.5–15.5)
WBC: 20.8 10*3/uL — ABNORMAL HIGH (ref 4.0–10.5)
nRBC: 0 % (ref 0.0–0.2)

## 2019-08-23 LAB — BASIC METABOLIC PANEL
Anion gap: 8 (ref 5–15)
BUN: 41 mg/dL — ABNORMAL HIGH (ref 8–23)
CO2: 28 mmol/L (ref 22–32)
Calcium: 9.2 mg/dL (ref 8.9–10.3)
Chloride: 94 mmol/L — ABNORMAL LOW (ref 98–111)
Creatinine, Ser: 1.08 mg/dL — ABNORMAL HIGH (ref 0.44–1.00)
GFR calc Af Amer: >60 mL/min (ref >60)
GFR calc non Af Amer: 55 mL/min — ABNORMAL LOW (ref >60)
Glucose, Bld: 106 mg/dL — ABNORMAL HIGH (ref 70–99)
Potassium: 3.7 mmol/L (ref 3.5–5.1)
Sodium: 130 mmol/L — ABNORMAL LOW (ref 135–145)

## 2019-08-23 LAB — GLUCOSE, CAPILLARY
Glucose-Capillary: 123 mg/dL — ABNORMAL HIGH (ref 70–99)
Glucose-Capillary: 109 mg/dL — ABNORMAL HIGH (ref 70–99)
Glucose-Capillary: 130 mg/dL — ABNORMAL HIGH (ref 70–99)
Glucose-Capillary: 167 mg/dL — ABNORMAL HIGH (ref 70–99)

## 2019-08-23 MED ORDER — FLUTICASONE FUROATE-VILANTEROL 200-25 MCG/INH IN AEPB
1.0000 | INHALATION_SPRAY | Freq: Every day | RESPIRATORY_TRACT | Status: DC
  Administered 2019-08-24 – 2019-08-25 (×2): 1 via RESPIRATORY_TRACT
  Filled 2019-08-23: qty 28

## 2019-08-23 MED ORDER — UMECLIDINIUM BROMIDE 62.5 MCG/INH IN AEPB
1.0000 | INHALATION_SPRAY | Freq: Every day | RESPIRATORY_TRACT | Status: DC
  Administered 2019-08-24 – 2019-08-25 (×2): 1 via RESPIRATORY_TRACT
  Filled 2019-08-23: qty 7

## 2019-08-23 NOTE — TOC Progression Note (Addendum)
Transition of Care [facility]) - Progression Note    Patient Details  Name: Chelsea Allison MRN: [id number] Date of Birth: May 29, 1955  [facility]) CM/SW [place], [place] Work Phone Number: 08/23/2019, 12:54 PM  Clinical Narrative:    MSW Intern went to speak with pt about SNF placement, as she had changed her mind about going home. She noted she would like to stay in [place], but on the [facility] side. She showed interest in the [place] area, but said no [employer]. She stated she will need a family member to be educated on her care before discharging. She has not had covid vaccines. Fax out and FL2 will be completed. Will follow with bed offers. Insurance started.   Expected Discharge Plan: [place] Barriers to Discharge: Continued Medical Work up  Expected Discharge Plan and Services Expected Discharge Plan: [place] arrangements for the past 2 months: Single Family Home                                       Social Determinants of Health (SDOH) Interventions    Readmission Risk Interventions Readmission Risk Prevention Plan 01/11/2019 12/20/2018 12/20/2018  Transportation Screening Complete - Complete  PCP or Specialist Appt within 5-7 Days - Complete -  PCP or Specialist Appt within 3-5 Days (No Data) - -  Home Care Screening - - Complete  Medication Review (RN CM) - - Complete  HRI or Home Care Consult Complete - -  Social Work Consult for [place] Planning/Counseling Complete - -  Palliative Care Screening Not Applicable - -  Medication Review (RN Care Manager) Complete - -  Some recent data might be hidden

## 2019-08-23 NOTE — Progress Notes (Signed)
PROGRESS NOTE  Chelsea Allison  DOB: February 06, 1956  PCP: Beckie Salts, MD WER:[id number]  DOA: 08/10/2019  LOS: 12 days   Chief Complaint  Patient presents with  . Respiratory Distress   Brief narrative: Patient is 64 yo female with a PMHx of CAD/STEMI in Oct. 2020 (s/p PCI), COPD on home O2, NSCLC s/p partial lung resection, CHF. She presented to the ED on 6/3 with shortness of breath. Found to have elevated troponin.   Admitted to hospitalist service. Subsequent cardiac cath was negative.  Managed for COPD exacerbation.  However respiratory status worsened. 6/5, patient was intubated and transferred to ICU.  She was extubated and again reintubated 3 times.  Final successful extubation on 6/12.  Since then, patient has remained on submental oxygen by nasal cannula. 6/15, pt was transferred out of the ICU to [facility].  Subjective: Patient was seen and examined this morning.  Pleasant middle-aged Caucasian female.  Looks older for age.  Not in physical distress.  Assessment/Plan: Acute on chronic hypoxic and hypercapnic respiratory failure Community-acquired pneumonia -Intubated x3 due to recurrent respiratory distress, finally extubated on 6/12 -Currently on 2 L of oxygen, saturating well, noted intermittent tachypnea -Currently afebrile, with leukocytosis (on steroids) -Status post bronchoscopy on 6/8 which showed tracheobronchomalacia, mucous plugs, some fibrosis -Bronchial brushing done on 6/8 grew Enterococcus faecalis, bowel 6/10 grew GPC in chains -Continue p.o. Augmentin, last dose on 6/17 -Continue nebulizer, inhalers, steroids (taper over 2-weeks) -Continue supplemental oxygen, BiPAP as needed, incentive spirometry, flutter valve -PCCM following, monitor closely  CAD status post PCI x2 in 2020 Elevated troponin -Shortness of breath and elevated troponin on admission -Cardiology consult appreciated. -Cardiac catheter this admission showed chronic LAD blockage, no significant  new lesions, EF worsened to 30 to 35% -Troponin elevation was deemed secondary to COPD exacerbation. -Recommend to continue aspirin, Brilinta Crestor, Brilinta  Acute on chronic systolic HF Essential hypertension -Continue Lasix, metoprolol, losartan -Continue monitor blood pressure, renal function and electrolytes.  Diabetes mellitus type 2 - A1c 5.8 on 08/11/19 -Continue SSI, Lantus, Accu-Cheks, hypoglycemic protocol  GERD -Continue home PPI  Opioid dependence -Restart buprenorphine patch  Deconditioning PT/OT-recommend SNF. Initially refused but is now considering SNF placement.  Mobility: Encourage ambulation.   Code Status:  Full code DVT prophylaxis: enoxaparin (LOVENOX) injection 40 mg Start: 08/12/19 1200 SCD's Start: 08/11/19 1455  Antimicrobials:  Augmentin to end on 6/17. Fluid: None  Diet: Cardiac/diabetic diet  Consultants: Pulmonary, cardiology Family Communication:  None at bedside  Status is: Inpatient  Stable for discharge.  Pending placement Dispo: The patient is from: Home              Anticipated d/c is to: SNF              Anticipated d/c date is: Whenever bed is available              Patient currently is medically stable to d/c.        Antimicrobials: Anti-infectives (From admission, onward)   Start     Dose/Rate Route Frequency Ordered Stop   08/20/19 2200  amoxicillin-clavulanate (AUGMENTIN) 400-57 MG/5ML suspension 875 mg     Discontinue     875 mg Oral Every 12 hours 08/20/19 2140 08/24/19 2359   08/20/19 1015  amoxicillin-clavulanate (AUGMENTIN) 875-125 MG per tablet 1 tablet  Status:  Discontinued        1 tablet Oral Every 12 hours 08/20/19 1000 08/20/19 2140   08/18/19 1300  amoxicillin-clavulanate (AUGMENTIN) 400-57 MG/5ML suspension  875 mg  Status:  Discontinued        875 mg Per Tube Every 12 hours 08/18/19 1158 08/20/19 1000   08/18/19 1200  amoxicillin-clavulanate (AUGMENTIN) 875-125 MG per tablet 1 tablet  Status:   Discontinued        1 tablet Oral Every 12 hours 08/18/19 1149 08/18/19 1155   08/17/19 1800  vancomycin (VANCOREADY) IVPB 1500 mg/300 mL  Status:  Discontinued        1,500 mg 150 mL/hr over 120 Minutes Intravenous Every 24 hours 08/17/19 1638 08/18/19 1149   08/13/19 2200  cefTRIAXone (ROCEPHIN) 2 g in sodium chloride 0.9 % 100 mL IVPB  Status:  Discontinued        2 g 200 mL/hr over 30 Minutes Intravenous Every 24 hours 08/13/19 1343 08/17/19 1640   08/13/19 1315  cefTRIAXone (ROCEPHIN) 1 g in sodium chloride 0.9 % 100 mL IVPB  Status:  Discontinued        1 g 200 mL/hr over 30 Minutes Intravenous Every 24 hours 08/13/19 1300 08/13/19 1343   08/12/19 2200  vancomycin (VANCOREADY) IVPB 750 mg/150 mL  Status:  Discontinued        750 mg 150 mL/hr over 60 Minutes Intravenous Every 12 hours 08/12/19 1001 08/13/19 1303   08/12/19 1015  ceFEPIme (MAXIPIME) 2 g in sodium chloride 0.9 % 100 mL IVPB  Status:  Discontinued        2 g 200 mL/hr over 30 Minutes Intravenous Every 12 hours 08/12/19 1001 08/13/19 1300   08/12/19 1015  vancomycin (VANCOREADY) IVPB 1500 mg/300 mL        1,500 mg 150 mL/hr over 120 Minutes Intravenous  Once 08/12/19 1001 08/12/19 1446   08/11/19 0600  cefTRIAXone (ROCEPHIN) 1 g in sodium chloride 0.9 % 100 mL IVPB  Status:  Discontinued        1 g 200 mL/hr over 30 Minutes Intravenous Every 24 hours 08/11/19 0552 08/12/19 0925   08/11/19 0600  azithromycin (ZITHROMAX) 500 mg in sodium chloride 0.9 % 250 mL IVPB        500 mg 250 mL/hr over 60 Minutes Intravenous Every 24 hours 08/11/19 0552 08/15/19 0659        Code Status: Full Code   Diet Order            Diet heart healthy/carb modified Room service appropriate? Yes; Fluid consistency: Thin  Diet effective now                 Infusions:  . sodium chloride Stopped (08/19/19 1858)    Scheduled Meds: . amoxicillin-clavulanate  875 mg Oral Q12H  . aspirin EC  81 mg Oral Daily  . buprenorphine  1 patch  Transdermal Weekly  . chlorhexidine gluconate (MEDLINE KIT)  15 mL Mouth Rinse BID  . Chlorhexidine Gluconate Cloth  6 each Topical Daily  . docusate sodium  100 mg Oral BID  . enoxaparin (LOVENOX) injection  40 mg Subcutaneous Q24H  . feeding supplement (ENSURE ENLIVE)  237 mL Oral TID BM  . fluticasone furoate-vilanterol  1 puff Inhalation Daily  . furosemide  40 mg Intravenous Daily  . guaiFENesin  600 mg Oral BID  . insulin aspart  3-9 Units Subcutaneous TID AC & HS  . insulin detemir  5 Units Subcutaneous BID  . losartan  12.5 mg Oral Daily  . metoprolol succinate  25 mg Oral Daily  . multivitamin with minerals  1 tablet Oral Daily  . nicotine  21 mg Transdermal Daily  . pantoprazole  40 mg Oral QHS  . potassium chloride  40 mEq Oral Daily  . [START ON 08/24/2019] predniSONE  40 mg Oral Q breakfast   Followed by  . [START ON 08/26/2019] predniSONE  30 mg Oral Q breakfast   Followed by  . [START ON 08/28/2019] predniSONE  20 mg Oral Q breakfast   Followed by  . [START ON 08/30/2019] predniSONE  10 mg Oral Q breakfast   Followed by  . [START ON 09/01/2019] predniSONE  10 mg Oral Q breakfast  . rosuvastatin  20 mg Oral Daily  . ticagrelor  90 mg Oral BID  . umeclidinium bromide  1 puff Inhalation Daily    PRN meds: acetaminophen, albuterol, benzocaine, melatonin, ondansetron (ZOFRAN) IV, polyethylene glycol, sodium chloride flush   Objective: Vitals:   08/23/19 1144 08/23/19 1552  BP: 95/63 (!) 91/55  Pulse: 72 69  Resp: 20 19  Temp: 98.6 F (37 C) 98.5 F (36.9 C)  SpO2: 97% 95%    Intake/Output Summary (Last 24 hours) at 08/23/2019 1818 Last data filed at 08/23/2019 1134 Gross per 24 hour  Intake --  Output 1225 ml  Net -1225 ml   Filed Weights   08/21/19 0700 08/22/19 0500 08/23/19 0420  Weight: 81.2 kg 79.9 kg 79.3 kg   Weight change: -0.611 kg Body mass index is 25.81 kg/m.   Physical Exam: General exam: Appears calm and comfortable.  Not in physical  distress Skin: No rashes, lesions or ulcers. HEENT: Atraumatic, normocephalic, supple neck, no obvious bleeding Lungs: Clear to auscultation bilaterally CVS: Regular rate and rhythm, no murmur GI/Abd soft, nontender, nondistended, bowel sound present CNS: Alert, awake, oriented x3 Psychiatry: Mood appropriate Extremities: No pedal edema, no calf tenderness  Data Review: I have personally reviewed the laboratory data and studies available.  Recent Labs  Lab 08/18/19 0241 08/19/19 0203 08/20/19 0343 08/22/19 0357 08/23/19 0335  WBC 15.0* 16.5* 22.2* 25.4* 20.8*  NEUTROABS  --   --   --   --  15.7*  HGB 11.8* 12.8 12.8 13.6 13.7  HCT 39.6 43.4 43.0 43.3 44.3  MCV 88.2 87.3 85.8 82.3 83.7  PLT 220 213 231 213 253   Recent Labs  Lab 08/18/19 0241 08/19/19 0203 08/20/19 0343 08/22/19 0357 08/23/19 0335  NA 144 144 135 132* 130*  K 3.8 4.7 4.2 4.7 3.7  CL 112* 106 96* 96* 94*  CO2 _0 GLUCOSE 199* 223* 141* 127* 106*  BUN 54* 64* 54* 47* 41*  CREATININE 1.10* 1.24* 0.98 0.99 1.08*  CALCIUM 8.9 8.5* 8.8* 9.7 9.2  MG 2.7*  --   --  2.6*  --   PHOS 3.2  --   --   --   --     Signed, Terrilee Croak, MD [facility] Pager: [phone] (Secure Chat preferred). 08/23/2019

## 2019-08-23 NOTE — Telephone Encounter (Signed)
Patient has been called 3 times:  08/16/19 8:43am Called to schedule hosp f/u per staff message, patient still admitted - [facility]     08/18/19 8:06am LVM to schedule hosp f/u with Dr. Tamala Julian or an APP in 2-3 weeks per staff message. Patient still admitted at [facility]     08/22/2019 8:50 am LVM for patient to schedule hospital f/u. Patient is still admitted at [facility]

## 2019-08-23 NOTE — Progress Notes (Signed)
NAME:  Tomeka Kantner, MRN:  [id number], DOB:  [birth year], LOS: 12 ADMISSION DATE:  08/10/2019, CONSULTATION DATE:  08/12/2019 REFERRING MD:  Dr. Posey Pronto, CHIEF COMPLAINT:  Respiratory failure    Brief History   64yo f with a PMHx of CAD/STEMI in Oct. 2020 (s/p PCI), COPD on home O2, NSCLC s/p partial lung resection, CHF presented with shortness of breath and elevated troponin. Negative cardiac cath and treated for acute COPD exasperation with improvement. Admitted to ICU on 6/5 and transferred out following improvement. Decompensated with acute mental status change and respiratory distress on 6/7. Reintubated and transferred to ICU.  Past Medical History  Tobacco abuse NSTEMI 12/12/2018 status post PCI Neuropathy Type 2 diabetes Ischemic cardiomyopathy Hypertension Hyperlipidemia COPD Colon cancer Lung cancer CAD with drug-eluting stent times 09 December 2018  [facility] Events   6/4 - admitted 6/5 - Pt became unresponsive, obtunded, in significant respiratory distress. Transferred to ICU and intubated.  6/6 - Extubated and transferred out of ICU.  6/7 - Pt in distress and acutely tachypneic, found to be in hypercarbic resp failure. Reintubated and readmitted to ICU 6/8 - clinically improving, weaning ventilator. Extubated, developed worsening wheezing, respiratory distress, and acidosis. Started on BiPAP and later intubated.  6/12 extubated  Consults:  PCCM  Procedures:  6/5 - Intubation (extubated 6/6) 6/7 - Re-intubated (extubated 6/8) 6/8 - Re-intubated  Significant Diagnostic Tests:  CTA chest 6/4> No central, segmental, or subsegmental pulmonary embolism. Mild ground-glass tree-in-bud opacities seen throughout both lungs (infectious vs. Inflammatory). Stable ill-defined pulmonary nodules in the posterior left lower lobe and right middle lobe.  Chest x-ray 6/5 > ETT and NGT in place. Developing LEFT perihilar airspace filling opacities consistent with infiltrate or  edema.  Head CT 6/5 > No evidence for acute intracranial abnormality.  Echo 6/5 > estimated LVEF 35-40% with mild concentric LVH.   Bronchoscopy 6/8 > tracheobronchomalacia, mucus plugs  Repeat bronchoscopy 6/10 > Significant airway distortion and stenosis of the R airways likely due to fibrosis s/p RUL lobectomy and radiation  Micro Data:  Covid 6/4 > negative Respiratory virus panel 6/4 >  Blood culture 6/4 > no growth 3 days MRSA PCR 6/4> negative Respiratory culture 6/5 > few Staph aureus Bronchial brushing 6/8> enterococcus faecalis  BAL 6/10> GPC in chains   Antimicrobials:  Azithromycin 6/4  Ceftriaxone 6/4 > 6/5. Restarted 6/6 > 613 Cefepime 6/5 > 6/6 Vancomycin 6/5 > 6/6, 6/10>> 6/13 Augmentin 6/13 >  Interim history/subjective:   Feeling a bit better.  Laying comfortably in bed.  Did not require any more BiPAP overnight  Objective   Blood pressure 113/65, pulse 78, temperature 98.8 F (37.1 C), temperature source Oral, resp. rate (!) 23, height _0  (1.753 m), weight 79.3 kg, SpO2 92 %.        Intake/Output Summary (Last 24 hours) at 08/23/2019 1011 Last data filed at 08/22/2019 2012 Gross per 24 hour  Intake --  Output 200 ml  Net -200 ml   Filed Weights   08/21/19 0700 08/22/19 0500 08/23/19 0420  Weight: 81.2 kg 79.9 kg 79.3 kg   General: Ill-appearing elderly woman, laying in bed in no distress HEENT: Oropharynx clear, pupils equal CV: Distant, regular, no murmur PULM: Distant, few scattered coarse breath sounds, no wheezing on her normal respiration but she does have some end expiratory wheeze with forced expiration GI: Nondistended Extremities: No edema Skin: Awake, alert, interacting appropriately  Resolved Hospital Problem list   AKI  Assessment & Plan:  Acute on chronic hypoxic and hypercapnic respiratory failure: Admitted 6/4 with CAP and COPD exacerbation and intubated. Treated and extubated with sudden worsening on 6/7, requiring  transfer back to the ICU and re-intubation. Pt extubated 6/8 and reintubated again due to acidosis and respiratory distress.  -Bronchoscopy on 6/8 showed tracheobronchomalacia. -Enterococcus faecalis on Bronch brushing 6/10. -Extubated 6/12 P: Currently on Brovana, Pulmicort, Yupelri.  Should be able to transition to her home Breo.  Favor adding LAMA for outpatient use as well, Incruse or Spiriva (which ever is most affordable on insurance formulary).  Start Incruse here.  Will make this change today Taper prednisone over 2 weeks She has not required any BiPAP last 24 hours.  Should not need this for home Finish Augmentin course on 6/17 PT/OT.  She may very well benefit from formal rehab Please call if we can assist further   Baltazar Apo, MD, PhD 08/23/2019, 10:15 AM [facility] [phone] or if no answer [phone]

## 2019-08-23 NOTE — Progress Notes (Signed)
Pt refused AM blood gas

## 2019-08-23 NOTE — NC FL2 (Signed)
[place] MEDICAID FL2 LEVEL OF CARE SCREENING TOOL     IDENTIFICATION  Patient Name: Chelsea Allison Birthdate: 10-01-1955 Sex: female Admission Date (Current Location): 08/10/2019  [facility] and [place] Number:  Herbalist and Address:  [facility] [place] [address] [place], [place] [address]      Provider Number: [id number]  Attending Physician Name and Address:  Terrilee Croak, MD  Relative Name and Phone Number:  Elliot Gault, [phone]    Current Level of Care: Hospital Recommended Level of Care: [place] Prior Approval Number:    Date Approved/Denied:   PASRR Number: [id number]  Discharge Plan: SNF    Current Diagnoses: Patient Active Problem List   Diagnosis Date Noted  . Acute respiratory failure ([place]) 08/11/2019  . CHF exacerbation ([place]) 08/11/2019  . AKI (acute kidney injury) ([place]) 08/11/2019  . NSTEMI (non-ST elevated myocardial infarction) ([place])   . Elevated troponin   . Renal insufficiency   . Chronic combined systolic and diastolic heart failure ([place])   . Respiratory failure with hypoxia and hypercapnia ([place]) 01/08/2019  . Hyperlipidemia 12/20/2018  . Hypertension 12/20/2018  . CAP (community acquired pneumonia) 12/20/2018  . Tobacco abuse 12/20/2018  . CAD in native artery 12/20/2018  . Unstable angina ([place])   . Hypoxemia   . Acute respiratory failure with hypoxemia ([place])   . Acute pulmonary edema (HCC)   . COPD with acute exacerbation ([place])   . STEMI (ST elevation myocardial infarction) ([place]) 12/12/2018  . Acute MI, lateral wall ([place]) 12/12/2018  . Acute on chronic respiratory failure with hypercapnia ([place])   . Acute combined systolic and diastolic heart failure (HCC)     Orientation RESPIRATION BLADDER Height & Weight     Self, Time, Situation, Place  O2 ([place] 2) External catheter Weight: 174 lb 12.8 oz (79.3 kg) Height:  '5\' 9"'  (175.3 cm)  BEHAVIORAL SYMPTOMS/MOOD NEUROLOGICAL BOWEL NUTRITION  STATUS      Colostomy Diet (See discharge summary)  AMBULATORY STATUS COMMUNICATION OF NEEDS Skin   Limited Assist Verbally Skin abrasions (Ecchymosis, Abdomen)                       Personal Care Assistance Level of Assistance  Bathing, Feeding, Dressing Bathing Assistance: Limited assistance Feeding assistance: Independent Dressing Assistance: Limited assistance     Functional Limitations Info  Sight, Hearing, Speech Sight Info: Adequate Hearing Info: Adequate Speech Info: Adequate    SPECIAL CARE FACTORS FREQUENCY  PT (By licensed PT), OT (By licensed OT)     PT Frequency: 5x week OT Frequency: 5x week            Contractures Contractures Info: Not present    Additional Factors Info  Code Status, Allergies, Insulin Sliding Scale Code Status Info: Full Allergies Info: Statins, Hydrochlorothiazide, Hydrocodone, Lisinopril, Oxycodone.   Insulin Sliding Scale Info: Insulin Aspart (Novolog) 3-9 U 3x daily w/ meals and @ bedtime, Insulin Detemir (Levemir) 5U 2x daily       Current Medications (08/23/2019):  This is the current hospital active medication list Current Facility-Administered Medications  Medication Dose Route Frequency Provider Last Rate Last Admin  . 0.9 %  sodium chloride infusion  250 mL Intravenous Continuous Julian Hy, DO   Stopped at 08/19/19 1858  . acetaminophen (TYLENOL) tablet 650 mg  650 mg Oral Q4H PRN Icard, Bradley L, DO   650 mg at 08/23/19 0105  . albuterol (PROVENTIL) (2.5 MG/3ML) 0.083% nebulizer  solution 2.5 mg  2.5 mg Nebulization Q2H PRN Gleason, Otilio Carpen, PA-C   2.5 mg at 08/17/19 1152  . amoxicillin-clavulanate (AUGMENTIN) 400-57 MG/5ML suspension 875 mg  875 mg Oral Q12H Collene Gobble, MD   875 mg at 08/23/19 0937  . aspirin EC tablet 81 mg  81 mg Oral Daily Icard, Bradley L, DO   81 mg at 08/23/19 0905  . benzocaine (ORAJEL) 10 % mucosal gel   Mouth/Throat PRN Candee Furbish, MD      . buprenorphine [place] Rushing) 20 MCG/HR 1  patch  1 patch Transdermal Weekly Alma Friendly, MD   1 patch at 08/22/19 2153  . chlorhexidine gluconate (MEDLINE KIT) (PERIDEX) 0.12 % solution 15 mL  15 mL Mouth Rinse BID Collier Bullock, MD   Given at 08/22/19 [id number]  . Chlorhexidine Gluconate Cloth 2 % PADS 6 each  6 each Topical Daily Julian Hy, DO   6 each at 08/21/19 0930  . docusate sodium (COLACE) capsule 100 mg  100 mg Oral BID Icard, Bradley L, DO   100 mg at 08/22/19 0850  . enoxaparin (LOVENOX) injection 40 mg  40 mg Subcutaneous Q24H Noemi Chapel P, DO   40 mg at 08/23/19 1114  . feeding supplement (ENSURE ENLIVE) (ENSURE ENLIVE) liquid 237 mL  237 mL Oral TID BM Icard, Bradley L, DO   237 mL at 08/22/19 0845  . fluticasone furoate-vilanterol (BREO ELLIPTA) 200-25 MCG/INH 1 puff  1 puff Inhalation Daily Byrum, Rose Fillers, MD      . furosemide (LASIX) injection 40 mg  40 mg Intravenous Daily Icard, Bradley L, DO   40 mg at 08/23/19 0905  . guaiFENesin (MUCINEX) 12 hr tablet 600 mg  600 mg Oral BID Icard, Bradley L, DO   600 mg at 08/23/19 0906  . insulin aspart (novoLOG) injection 3-9 Units  3-9 Units Subcutaneous TID AC & HS Icard, Bradley L, DO   6 Units at 08/22/19 1147  . insulin detemir (LEVEMIR) injection 5 Units  5 Units Subcutaneous BID Mannam, Praveen, MD   5 Units at 08/22/19 0851  . losartan (COZAAR) tablet 12.5 mg  12.5 mg Oral Daily Alma Friendly, MD   12.5 mg at 08/23/19 0904  . melatonin tablet 3 mg  3 mg Oral QHS PRN Stretch, Marily Lente, MD   3 mg at 08/22/19 2149  . methylPREDNISolone sodium succinate (SOLU-MEDROL) 125 mg/2 mL injection 60 mg  60 mg Intravenous q1800 Corey Harold, NP      . metoprolol succinate (TOPROL-XL) 24 hr tablet 25 mg  25 mg Oral Daily Alma Friendly, MD   25 mg at 08/23/19 0905  . multivitamin with minerals tablet 1 tablet  1 tablet Oral Daily Icard, Bradley L, DO   1 tablet at 08/23/19 0905  . nicotine (NICODERM CQ - dosed in mg/24 hours) patch 21 mg  21 mg Transdermal  Daily Anders Simmonds, MD   21 mg at 08/23/19 0907  . ondansetron (ZOFRAN) injection 4 mg  4 mg Intravenous Q6H PRN Burnell Blanks, MD      . pantoprazole (PROTONIX) EC tablet 40 mg  40 mg Oral QHS Icard, Bradley L, DO   40 mg at 08/22/19 2149  . polyethylene glycol (MIRALAX / GLYCOLAX) packet 17 g  17 g Oral Daily PRN Icard, Bradley L, DO      . potassium chloride (KLOR-CON) packet 40 mEq  40 mEq Oral Daily Alma Friendly, MD  40 mEq at 08/23/19 0904  . [START ON 08/24/2019] predniSONE (DELTASONE) tablet 40 mg  40 mg Oral Q breakfast Alma Friendly, MD       Followed by  . [START ON 08/26/2019] predniSONE (DELTASONE) tablet 30 mg  30 mg Oral Q breakfast Alma Friendly, MD       Followed by  . [START ON 08/28/2019] predniSONE (DELTASONE) tablet 20 mg  20 mg Oral Q breakfast Alma Friendly, MD       Followed by  . [START ON 08/30/2019] predniSONE (DELTASONE) tablet 10 mg  10 mg Oral Q breakfast Alma Friendly, MD       Followed by  . [START ON 09/01/2019] predniSONE (DELTASONE) tablet 10 mg  10 mg Oral Q breakfast Alma Friendly, MD      . rosuvastatin (CRESTOR) tablet 20 mg  20 mg Oral Daily Icard, Bradley L, DO   20 mg at 08/23/19 0907  . sodium chloride flush (NS) 0.9 % injection 3 mL  3 mL Intravenous PRN Burnell Blanks, MD      . ticagrelor [facility]) tablet 90 mg  90 mg Oral BID Icard, Bradley L, DO   90 mg at 08/23/19 0907  . umeclidinium bromide (INCRUSE ELLIPTA) 62.5 MCG/INH 1 puff  1 puff Inhalation Daily Byrum, Rose Fillers, MD         Discharge Medications: Please see discharge summary for a list of discharge medications.  Relevant Imaging Results:  Relevant Lab Results:   Additional Information SS# [place], [place] Work

## 2019-08-23 NOTE — Telephone Encounter (Signed)
-----   Message from Erma Heritage, [place] sent at 08/12/2019  7:34 AM EDT ----- Regarding: Hospital Follow-up Please schedule this patient for a follow-up appointment and call them with that information.  Primary Cardiologist: Dr. Tamala Julian Date of Discharge: 08/12/2019 Appointment Needed Within: 2-3 week Hospital Follow-up with Dr. Tamala Julian or an APP on his team per Dr. Harl Bowie  Thank you! [place] M. Ahmed Prima,  PA-C

## 2019-08-24 LAB — CBC WITH DIFFERENTIAL/PLATELET
Abs Immature Granulocytes: 0.59 10*3/uL — ABNORMAL HIGH (ref 0.00–0.07)
Basophils Absolute: 0 10*3/uL (ref 0.0–0.1)
Basophils Relative: 0 %
Eosinophils Absolute: 0 10*3/uL (ref 0.0–0.5)
Eosinophils Relative: 0 %
HCT: 42 % (ref 36.0–46.0)
Hemoglobin: 13.2 g/dL (ref 12.0–15.0)
Immature Granulocytes: 3 %
Lymphocytes Relative: 4 %
Lymphs Abs: 0.8 10*3/uL (ref 0.7–4.0)
MCH: 26 pg (ref 26.0–34.0)
MCHC: 31.4 g/dL (ref 30.0–36.0)
MCV: 82.8 fL (ref 80.0–100.0)
Monocytes Absolute: 1.1 10*3/uL — ABNORMAL HIGH (ref 0.1–1.0)
Monocytes Relative: 6 %
Neutro Abs: 16.8 10*3/uL — ABNORMAL HIGH (ref 1.7–7.7)
Neutrophils Relative %: 87 %
Platelets: 219 10*3/uL (ref 150–400)
RBC: 5.07 MIL/uL (ref 3.87–5.11)
RDW: 14.7 % (ref 11.5–15.5)
WBC: 19.4 10*3/uL — ABNORMAL HIGH (ref 4.0–10.5)
nRBC: 0 % (ref 0.0–0.2)

## 2019-08-24 LAB — GLUCOSE, CAPILLARY
Glucose-Capillary: 119 mg/dL — ABNORMAL HIGH (ref 70–99)
Glucose-Capillary: 178 mg/dL — ABNORMAL HIGH (ref 70–99)
Glucose-Capillary: 176 mg/dL — ABNORMAL HIGH (ref 70–99)
Glucose-Capillary: 145 mg/dL — ABNORMAL HIGH (ref 70–99)

## 2019-08-24 LAB — BASIC METABOLIC PANEL
Anion gap: 9 (ref 5–15)
BUN: 38 mg/dL — ABNORMAL HIGH (ref 8–23)
CO2: 28 mmol/L (ref 22–32)
Calcium: 9 mg/dL (ref 8.9–10.3)
Chloride: 94 mmol/L — ABNORMAL LOW (ref 98–111)
Creatinine, Ser: 1.21 mg/dL — ABNORMAL HIGH (ref 0.44–1.00)
GFR calc Af Amer: 55 mL/min — ABNORMAL LOW (ref >60)
GFR calc non Af Amer: 48 mL/min — ABNORMAL LOW (ref >60)
Glucose, Bld: 121 mg/dL — ABNORMAL HIGH (ref 70–99)
Potassium: 4.3 mmol/L (ref 3.5–5.1)
Sodium: 131 mmol/L — ABNORMAL LOW (ref 135–145)

## 2019-08-24 LAB — SARS CORONAVIRUS 2 (TAT 6-24 HRS): SARS Coronavirus 2: NEGATIVE

## 2019-08-24 NOTE — Progress Notes (Signed)
PROGRESS NOTE  Chelsea Allison  DOB: 1955-05-21  PCP: Beckie Salts, MD JWJ:[id number]  DOA: 08/10/2019  LOS: 13 days   Chief Complaint  Patient presents with  . Respiratory Distress   Brief narrative: Patient is 64 yo female with a PMHx of CAD/STEMI in Oct. 2020 (s/p PCI), COPD on home O2, NSCLC s/p partial lung resection, CHF. She presented to the ED on 6/3 with shortness of breath. Found to have elevated troponin.   Admitted to hospitalist service. Subsequent cardiac cath was negative.  Managed for COPD exacerbation.  However respiratory status worsened. 6/5, patient was intubated and transferred to ICU.  She was extubated and again reintubated 3 times.  Final successful extubation on 6/12.  Since then, patient has remained on submental oxygen by nasal cannula. 6/15, pt was transferred out of the ICU to [facility].  Subjective: Patient was seen and examined this morning.  Pleasant middle-aged Caucasian female.  Not in physical distress.  Feels lightheaded occasionally when getting up. Creatinine noted to be elevated this morning to 1.21.  Assessment/Plan: Acute on chronic hypoxic and hypercapnic respiratory failure Community-acquired pneumonia -Intubated x3 due to recurrent respiratory distress, finally extubated on 6/12 -Currently on 2 L of oxygen, saturating well, noted intermittent tachypnea -Currently afebrile, with leukocytosis (on steroids) -Status post bronchoscopy on 6/8 which showed tracheobronchomalacia, mucous plugs, some fibrosis -Bronchial brushing done on 6/8 grew Enterococcus faecalis, bowel 6/10 grew GPC in chains -Continue p.o. Augmentin, last dose today on 6/17 -Continue nebulizer, inhalers, steroids (taper over 2-weeks) -Continue supplemental oxygen, BiPAP as needed, incentive spirometry, flutter valve -PCCM following, monitor closely  CAD status post PCI x2 in 2020 Elevated troponin -Shortness of breath and elevated troponin on admission -Cardiology consult  appreciated. -Cardiac catheter this admission showed chronic LAD blockage, no significant new lesions, EF worsened to 30 to 35% -Troponin elevation was deemed secondary to COPD exacerbation. -Recommend to continue aspirin, Brilinta Crestor, Brilinta  Acute on chronic systolic HF Essential hypertension -Prior to admission, patient was on Lasix 20 mg daily at home along with metoprolol and losartan.   -Currently on IV Lasix for last several days.  Patient feels lightheaded, creatinine trending up as well, 1.21 today.  -Stopped Lasix this morning.  Continue metoprolol and losartan.    Diabetes mellitus type 2 - A1c 5.8 on 08/11/19 -Continue SSI, Lantus, Accu-Cheks, hypoglycemic protocol  GERD -Continue home PPI  Opioid dependence -Restart buprenorphine patch  Deconditioning PT/OT-recommend SNF. Initially refused but is now considering SNF placement.  Mobility: Encourage ambulation.   Code Status:  Full code DVT prophylaxis: enoxaparin (LOVENOX) injection 40 mg Start: 08/12/19 1200 SCD's Start: 08/11/19 1455  Antimicrobials:  Augmentin, last dose today Fluid: None  Diet: Cardiac/diabetic diet  Consultants: Pulmonary, cardiology Family Communication:  None at bedside  Status is: Inpatient  Stable for discharge.  Pending placement Dispo: The patient is from: Home              Anticipated d/c is to: SNF              Anticipated d/c date is: Whenever bed is available              Patient currently is medically stable to d/c.   Antimicrobials: Anti-infectives (From admission, onward)   Start     Dose/Rate Route Frequency Ordered Stop   08/20/19 2200  amoxicillin-clavulanate (AUGMENTIN) 400-57 MG/5ML suspension 875 mg        875 mg Oral Every 12 hours 08/20/19 2140 08/24/19 0957  08/20/19 1015  amoxicillin-clavulanate (AUGMENTIN) 875-125 MG per tablet 1 tablet  Status:  Discontinued        1 tablet Oral Every 12 hours 08/20/19 1000 08/20/19 2140   08/18/19 1300   amoxicillin-clavulanate (AUGMENTIN) 400-57 MG/5ML suspension 875 mg  Status:  Discontinued        875 mg Per Tube Every 12 hours 08/18/19 1158 08/20/19 1000   08/18/19 1200  amoxicillin-clavulanate (AUGMENTIN) 875-125 MG per tablet 1 tablet  Status:  Discontinued        1 tablet Oral Every 12 hours 08/18/19 1149 08/18/19 1155   08/17/19 1800  vancomycin (VANCOREADY) IVPB 1500 mg/300 mL  Status:  Discontinued        1,500 mg 150 mL/hr over 120 Minutes Intravenous Every 24 hours 08/17/19 1638 08/18/19 1149   08/13/19 2200  cefTRIAXone (ROCEPHIN) 2 g in sodium chloride 0.9 % 100 mL IVPB  Status:  Discontinued        2 g 200 mL/hr over 30 Minutes Intravenous Every 24 hours 08/13/19 1343 08/17/19 1640   08/13/19 1315  cefTRIAXone (ROCEPHIN) 1 g in sodium chloride 0.9 % 100 mL IVPB  Status:  Discontinued        1 g 200 mL/hr over 30 Minutes Intravenous Every 24 hours 08/13/19 1300 08/13/19 1343   08/12/19 2200  vancomycin (VANCOREADY) IVPB 750 mg/150 mL  Status:  Discontinued        750 mg 150 mL/hr over 60 Minutes Intravenous Every 12 hours 08/12/19 1001 08/13/19 1303   08/12/19 1015  ceFEPIme (MAXIPIME) 2 g in sodium chloride 0.9 % 100 mL IVPB  Status:  Discontinued        2 g 200 mL/hr over 30 Minutes Intravenous Every 12 hours 08/12/19 1001 08/13/19 1300   08/12/19 1015  vancomycin (VANCOREADY) IVPB 1500 mg/300 mL        1,500 mg 150 mL/hr over 120 Minutes Intravenous  Once 08/12/19 1001 08/12/19 1446   08/11/19 0600  cefTRIAXone (ROCEPHIN) 1 g in sodium chloride 0.9 % 100 mL IVPB  Status:  Discontinued        1 g 200 mL/hr over 30 Minutes Intravenous Every 24 hours 08/11/19 0552 08/12/19 0925   08/11/19 0600  azithromycin (ZITHROMAX) 500 mg in sodium chloride 0.9 % 250 mL IVPB        500 mg 250 mL/hr over 60 Minutes Intravenous Every 24 hours 08/11/19 0552 08/15/19 0659        Code Status: Full Code   Diet Order            Diet heart healthy/carb modified Room service appropriate?  Yes; Fluid consistency: Thin  Diet effective now                 Infusions:  . sodium chloride Stopped (08/19/19 1858)    Scheduled Meds: . aspirin EC  81 mg Oral Daily  . buprenorphine  1 patch Transdermal Weekly  . chlorhexidine gluconate (MEDLINE KIT)  15 mL Mouth Rinse BID  . Chlorhexidine Gluconate Cloth  6 each Topical Daily  . docusate sodium  100 mg Oral BID  . enoxaparin (LOVENOX) injection  40 mg Subcutaneous Q24H  . feeding supplement (ENSURE ENLIVE)  237 mL Oral TID BM  . fluticasone furoate-vilanterol  1 puff Inhalation Daily  . guaiFENesin  600 mg Oral BID  . insulin aspart  3-9 Units Subcutaneous TID AC & HS  . insulin detemir  5 Units Subcutaneous BID  . losartan  12.5 mg Oral Daily  . metoprolol succinate  25 mg Oral Daily  . multivitamin with minerals  1 tablet Oral Daily  . nicotine  21 mg Transdermal Daily  . pantoprazole  40 mg Oral QHS  . potassium chloride  40 mEq Oral Daily  . predniSONE  40 mg Oral Q breakfast   Followed by  . [START ON 08/26/2019] predniSONE  30 mg Oral Q breakfast   Followed by  . [START ON 08/28/2019] predniSONE  20 mg Oral Q breakfast   Followed by  . [START ON 08/30/2019] predniSONE  10 mg Oral Q breakfast   Followed by  . [START ON 09/01/2019] predniSONE  10 mg Oral Q breakfast  . rosuvastatin  20 mg Oral Daily  . ticagrelor  90 mg Oral BID  . umeclidinium bromide  1 puff Inhalation Daily    PRN meds: acetaminophen, albuterol, benzocaine, melatonin, ondansetron (ZOFRAN) IV, polyethylene glycol, sodium chloride flush   Objective: Vitals:   08/24/19 1200 08/24/19 1232  BP: 99/66 (!) 102/59  Pulse: 88 80  Resp: 18 20  Temp: 98.1 F (36.7 C) 98 F (36.7 C)  SpO2: 93% 94%    Intake/Output Summary (Last 24 hours) at 08/24/2019 1415 Last data filed at 08/24/2019 1200 Gross per 24 hour  Intake 620 ml  Output 1450 ml  Net -830 ml   Filed Weights   08/22/19 0500 08/23/19 0420 08/24/19 0500  Weight: 79.9 kg 79.3 kg 81.3  kg   Weight change: 2.011 kg Body mass index is 26.47 kg/m.   Physical Exam: General exam: Appears calm and comfortable. Not in physical distress Skin: No rashes, lesions or ulcers. HEENT: Atraumatic, normocephalic, supple neck, no obvious bleeding Lungs: Clear to auscultation bilaterally CVS: Regular rate and rhythm, no murmur GI/Abd soft, nontender, nondistended, bowel sound present CNS: Alert, awake, oriented x3 Psychiatry: Mood appropriate. Extremities: No pedal edema, no calf tenderness  Data Review: I have personally reviewed the laboratory data and studies available.  Recent Labs  Lab 08/19/19 0203 08/20/19 0343 08/22/19 0357 08/23/19 0335 08/24/19 0308  WBC 16.5* 22.2* 25.4* 20.8* 19.4*  NEUTROABS  --   --   --  15.7* 16.8*  HGB 12.8 12.8 13.6 13.7 13.2  HCT 43.4 43.0 43.3 44.3 42.0  MCV 87.3 85.8 82.3 83.7 82.8  PLT 213 231 213 253 219   Recent Labs  Lab 08/18/19 0241 08/18/19 0241 08/19/19 0203 08/20/19 0343 08/22/19 0357 08/23/19 0335 08/24/19 0308  NA 144   < > 144 135 132* 130* 131*  K 3.8   < > 4.7 4.2 4.7 3.7 4.3  CL 112*   < > 106 96* 96* 94* 94*  CO2 24   < > _0 GLUCOSE 199*   < > 223* 141* 127* 106* 121*  BUN 54*   < > 64* 54* 47* 41* 38*  CREATININE 1.10*   < > 1.24* 0.98 0.99 1.08* 1.21*  CALCIUM 8.9   < > 8.5* 8.8* 9.7 9.2 9.0  MG 2.7*  --   --   --  2.6*  --   --   PHOS 3.2  --   --   --   --   --   --    < > = values in this interval not displayed.    Signed, Terrilee Croak, MD [facility] Pager: [phone] (Secure Chat preferred). 08/24/2019

## 2019-08-24 NOTE — TOC Progression Note (Signed)
Transition of Care (TOC) - Progression Note    Patient Details  Name: Chelsea Allison MRN: [id number] Date of Birth: 02/02/1956  Transition of Care (TOC) CM/SW Contact   N , LCSWA Phone Number: 08/24/2019, 4:19 PM  Clinical Narrative:     CSW met with patient to discuss disposition. Patient remains agreeable to SNF placement. Patient has accepted bed offer with [facility]. Patient states she has not received covid vaccines.   CSW confirmed bed offer with [facility]. Received SNF authorization refer # [id number] RN updated and advised covid test is needed.   , MSW, LCSWA Clinical Social Worker    Expected Discharge Plan: Home w Home Health Services Barriers to Discharge: Continued Medical Work up  Expected Discharge Plan and Services Expected Discharge Plan: Home w Home Health Services       Living arrangements for the past 2 months: Single Family Home                                       Social Determinants of Health (SDOH) Interventions    Readmission Risk Interventions Readmission Risk Prevention Plan 01/11/2019 12/20/2018 12/20/2018  Transportation Screening Complete - Complete  PCP or Specialist Appt within 5-7 Days - Complete -  PCP or Specialist Appt within 3-5 Days (No Data) - -  Home Care Screening - - Complete  Medication Review (RN CM) - - Complete  HRI or Home Care Consult Complete - -  Social Work Consult for Recovery Care Planning/Counseling Complete - -  Palliative Care Screening Not Applicable - -  Medication Review (RN Care Manager) Complete - -  Some recent data might be hidden   

## 2019-08-24 NOTE — Progress Notes (Signed)
Plan of care reviewed. Pt's been progressing. She's hemodynamically stable. NSR on monitor, HR 60s-70s, BP 98/62 - 108/ 61 mmHg, SPO2 96-98% on 2 LPM of O2 NCL, RR 18-23, No respiratory distress. Auscultated breath sound had rhonchi bilaterally. She presented with strong and productive cough with thick yellowish sputum. Encouraged using flutter vulve for breathing exercise.  No acute distress noted tonight. We will continue to monitor.  Kennyth Lose, RN

## 2019-08-24 NOTE — Plan of Care (Addendum)
Plan of care reviewed. Pt is progressing.  Problem: Clinical Measurements: Goal: Cardiovascular complication will be avoided Outcome: Progressing. She 's hemodynamically stable,NSR on monitor, HR 60s-70s, BP within normal limits.   Problem: Clinical Measurements: Goal: Respiratory complications will improve Outcome: Progressing. RR 20-28, SPO2 stays above 92- 96% with 1.5 LPM of NCL. Marland Kitchen Auscultated right lung: rhonchi, inspiratory wheezing, pleural rub and coarse crackles. Left lung clear auscultated. Able to do breathing exercise with flutter valve and incentive spirometer effectively. No immediate distress.   Problem: Clinical Measurements: Goal: Will remain free from infection Outcome: Progressing: remained afebrile.    Problem: Activity: Goal: Risk for activity intolerance will decrease Outcome: Progressing: able ti ambulate in room with one standby assist, tolerated well. No SOB.   Problem: Nutrition: Goal: Adequate nutrition will be maintained Outcome: Progressing. Pt has better appetite today. She preferred to do diet controlled instead of taking Insulin SQ. Education given per protocol.    Problem: Pain Managment: Goal: General experience of comfort will improve Outcome: Progressing: stated having some back pain, well tolerated. Requested Tylenol and she rest well tonight.    Problem: Education: Goal: Knowledge of the prescribed therapeutic regimen will improve Outcome: Progressing. Pt refused some schedule medications, such as Insulin Novolog, Livermir, Protonix, Colace, ect.  Education given for causes and effects of medication. Pt acknowledged and continue to refused those medication.    Kennyth Lose, RN

## 2019-08-24 NOTE — Progress Notes (Signed)
Physical Therapy Treatment Patient Details Name: Chelsea Allison MRN: [id number] DOB: 1955-12-10 Today's Date: 08/24/2019    History of Present Illness Pt adm 6/4 with acute copd exacerbation. Respiratory distress on 6/5 and intubated 6/5-6/6. Reintubated 6/7- 6/8. Reintubated 6/8-6/12. PMH - NSTEMI, peripheral neuropathy, DM, HTN, COPD, colon CA, lung CA, CAD.     PT Comments    Pt has expressed ongoing anxiety about her breathing, and talked with PT about the benefit of exercise and strengthening lungs with spirometer.  Pt is anticipating getting to SNF and then home but remains a bit concerned that her condition will take a turn for the worse.  Progress with mobility and strengthening as tolerated, pulses were WFL and sats 93-95% with entire exercise routine.   Follow Up Recommendations  SNF     Equipment Recommendations  None recommended by PT    Recommendations for Other Services       Precautions / Restrictions Precautions Precautions: Fall Precaution Comments: anxiety and monitor sats, pulses Restrictions Weight Bearing Restrictions: No    Mobility  Bed Mobility Overal bed mobility: Modified Independent                Transfers                 General transfer comment: declined over fatigue  Ambulation/Gait             General Gait Details: fatigued from being up with nursing   Stairs             Wheelchair Mobility    Modified Rankin (Stroke Patients Only)       Balance                                            Cognition Arousal/Alertness: Awake/alert Behavior During Therapy: WFL for tasks assessed/performed Overall Cognitive Status: Within Functional Limits for tasks assessed                                        Exercises General Exercises - Lower Extremity Ankle Circles/Pumps: AROM;5 reps Quad Sets: AROM;10 reps Gluteal Sets: AROM;10 reps Heel Slides: AROM;10 reps Hip  ABduction/ADduction: AROM;10 reps Straight Leg Raises: AROM;10 reps Hip Flexion/Marching: AROM;10 reps    General Comments        Pertinent Vitals/Pain Pain Assessment: No/denies pain    Home Living                      Prior Function            PT Goals (current goals can now be found in the care plan section) Acute Rehab PT Goals Patient Stated Goal: go home Progress towards PT goals: Progressing toward goals    Frequency    Min 3X/week      PT Plan Current plan remains appropriate    Co-evaluation              AM-PAC PT "6 Clicks" Mobility   Outcome Measure  Help needed turning from your back to your side while in a flat bed without using bedrails?: None Help needed moving from lying on your back to sitting on the side of a flat bed without using bedrails?: A Little Help needed moving to and from a bed  to a chair (including a wheelchair)?: A Little Help needed standing up from a chair using your arms (e.g., wheelchair or bedside chair)?: A Little Help needed to walk in hospital room?: A Little Help needed climbing 3-5 steps with a railing? : A Lot 6 Click Score: 18    End of Session Equipment Utilized During Treatment: Oxygen Activity Tolerance: Patient limited by fatigue Patient left: in bed;with call bell/phone within reach;with nursing/sitter in room Nurse Communication: Mobility status PT Visit Diagnosis: Unsteadiness on feet (R26.81);Other abnormalities of gait and mobility (R26.89);Muscle weakness (generalized) (M62.81)     Time: 4827-0786 PT Time Calculation (min) (ACUTE ONLY): 29 min  Charges:  $Therapeutic Exercise: 23-37 mins                    Ramond Dial 08/24/2019, 2:18 PM  Mee Hives, PT MS Acute Rehab Dept. Number: [place] [facility] [place]

## 2019-08-24 NOTE — Care Management Important Message (Signed)
Important Message  Patient Details  Name: Chelsea Allison MRN: [id number] Date of Birth: 1956/02/05   Medicare Important Message Given:  Yes     Shelda Altes 08/24/2019, 9:27 AM

## 2019-08-25 LAB — CBC WITH DIFFERENTIAL/PLATELET
Abs Immature Granulocytes: 0.68 10*3/uL — ABNORMAL HIGH (ref 0.00–0.07)
Basophils Absolute: 0.1 10*3/uL (ref 0.0–0.1)
Basophils Relative: 0 %
Eosinophils Absolute: 0.1 10*3/uL (ref 0.0–0.5)
Eosinophils Relative: 0 %
HCT: 42.6 % (ref 36.0–46.0)
Hemoglobin: 13.4 g/dL (ref 12.0–15.0)
Immature Granulocytes: 3 %
Lymphocytes Relative: 7 %
Lymphs Abs: 1.5 10*3/uL (ref 0.7–4.0)
MCH: 25.8 pg — ABNORMAL LOW (ref 26.0–34.0)
MCHC: 31.5 g/dL (ref 30.0–36.0)
MCV: 82.1 fL (ref 80.0–100.0)
Monocytes Absolute: 1.8 10*3/uL — ABNORMAL HIGH (ref 0.1–1.0)
Monocytes Relative: 8 %
Neutro Abs: 17.6 10*3/uL — ABNORMAL HIGH (ref 1.7–7.7)
Neutrophils Relative %: 82 %
Platelets: 263 10*3/uL (ref 150–400)
RBC: 5.19 MIL/uL — ABNORMAL HIGH (ref 3.87–5.11)
RDW: 14.7 % (ref 11.5–15.5)
WBC: 21.7 10*3/uL — ABNORMAL HIGH (ref 4.0–10.5)
nRBC: 0 % (ref 0.0–0.2)

## 2019-08-25 LAB — BASIC METABOLIC PANEL
Anion gap: 9 (ref 5–15)
BUN: 34 mg/dL — ABNORMAL HIGH (ref 8–23)
CO2: 28 mmol/L (ref 22–32)
Calcium: 9 mg/dL (ref 8.9–10.3)
Chloride: 92 mmol/L — ABNORMAL LOW (ref 98–111)
Creatinine, Ser: 1.15 mg/dL — ABNORMAL HIGH (ref 0.44–1.00)
GFR calc Af Amer: 59 mL/min — ABNORMAL LOW (ref >60)
GFR calc non Af Amer: 51 mL/min — ABNORMAL LOW (ref >60)
Glucose, Bld: 108 mg/dL — ABNORMAL HIGH (ref 70–99)
Potassium: 3.9 mmol/L (ref 3.5–5.1)
Sodium: 129 mmol/L — ABNORMAL LOW (ref 135–145)

## 2019-08-25 LAB — GLUCOSE, CAPILLARY
Glucose-Capillary: 97 mg/dL (ref 70–99)
Glucose-Capillary: 120 mg/dL — ABNORMAL HIGH (ref 70–99)

## 2019-08-25 MED ORDER — DOCUSATE SODIUM 100 MG PO CAPS: 100.0000 mg | ORAL_CAPSULE | Freq: Two times a day (BID) | ORAL | 0 refills | Status: DC

## 2019-08-25 MED ORDER — UMECLIDINIUM BROMIDE 62.5 MCG/INH IN AEPB: 1.0000 | INHALATION_SPRAY | Freq: Every day | RESPIRATORY_TRACT | Status: DC

## 2019-08-25 MED ORDER — ENSURE ENLIVE PO LIQD: 237.0000 mL | Freq: Three times a day (TID) | ORAL | 12 refills | Status: DC

## 2019-08-25 MED ORDER — MELATONIN 3 MG PO TABS: 3.0000 mg | ORAL_TABLET | Freq: Every evening | ORAL | 0 refills | Status: DC | PRN

## 2019-08-25 MED ORDER — INSULIN ASPART 100 UNIT/ML ~~LOC~~ SOLN: 3.0000 [IU] | Freq: Three times a day (TID) | SUBCUTANEOUS | 11 refills | Status: DC

## 2019-08-25 MED ORDER — BUPRENORPHINE 20 MCG/HR TD PTWK: 1.0000 | MEDICATED_PATCH | TRANSDERMAL | 0 refills | Status: AC

## 2019-08-25 MED ORDER — PREDNISONE 10 MG PO TABS
ORAL_TABLET | ORAL | 0 refills | Status: DC
Start: 2019-08-25 — End: 2019-08-29

## 2019-08-25 MED ORDER — NICOTINE 21 MG/24HR TD PT24: 21.0000 mg | MEDICATED_PATCH | Freq: Every day | TRANSDERMAL | 0 refills | Status: DC

## 2019-08-25 MED ORDER — POLYETHYLENE GLYCOL 3350 17 G PO PACK: 17.0000 g | PACK | Freq: Every day | ORAL | 0 refills | Status: DC | PRN

## 2019-08-25 MED ORDER — ADULT MULTIVITAMIN W/MINERALS CH: 1.0000 | ORAL_TABLET | Freq: Every day | ORAL | Status: DC

## 2019-08-25 MED ORDER — FUROSEMIDE 20 MG PO TABS: 20.0000 mg | ORAL_TABLET | Freq: Every day | ORAL | Status: DC

## 2019-08-25 MED ORDER — GUAIFENESIN ER 600 MG PO TB12: 600.0000 mg | ORAL_TABLET | Freq: Two times a day (BID) | ORAL | Status: DC

## 2019-08-25 NOTE — Progress Notes (Signed)
Pt discharged from unit to [facility]. Medication/discharge instruction given, VVS  Phoebe Sharps, RN

## 2019-08-25 NOTE — Progress Notes (Signed)
Pt. Home medication (buprenorphine 39mcg transdermal patch) counted and  returned to pt. Total of 3 patches . From unit pyxis. Receipt signed and placed in pt. Chart . [facility] notified.  Phoebe Sharps, RN

## 2019-08-25 NOTE — TOC Transition Note (Signed)
Transition of Care [facility]) - CM/SW Discharge Note   Patient Details  Name: Chelsea Allison MRN: [id number] Date of Birth: 1955/04/17  Transition of Care [facility]) CM/SW Contact:  Vinie Sill, Faith Phone Number: 08/25/2019, 11:58 AM   Clinical Narrative:     Patient will DC to: [facility] Date: 08/25/2019 Family Notified: patient states she has informed her [place] By: Corey Harold  Per MD patient is ready for discharge. RN, patient, and facility notified of DC. Discharge Summary sent to facility. RN given number for [phone]. Ambulance transport requested for patient.   Clinical Social Worker signing off.  Thurmond Butts, MSW, [place] Clinical Social Worker    Final next level of care: Skilled Nursing Facility Barriers to Discharge: Barriers Resolved   Patient Goals and CMS Choice Patient states their goals for this hospitalization and ongoing recovery are:: to go home with home health services CMS Medicare.gov Compare Post Acute Care list provided to:: Patient Choice offered to / list presented to : Patient  Discharge Placement PASRR number recieved: 08/23/19            Patient [facility] bed at: [facility] Patient to be transferred to facility by: [place] Name of family member notified: patient states she informed her Georgianne Fick Patient and family notified of of transfer: 08/25/19  Discharge Plan and Services                                     Social Determinants of Health (SDOH) Interventions     Readmission Risk Interventions Readmission Risk Prevention Plan 01/11/2019 12/20/2018 12/20/2018  Transportation Screening Complete - Complete  PCP or Specialist Appt within 5-7 Days - Complete -  PCP or Specialist Appt within 3-5 Days (No Data) - -  Home Care Screening - - Complete  Medication Review (RN CM) - - Complete  HRI or Home Care Consult Complete - -  Social Work Consult for [place] Planning/Counseling  Complete - -  Palliative Care Screening Not Applicable - -  Medication Review (RN Care Manager) Complete - -  Some recent data might be hidden

## 2019-08-25 NOTE — Discharge Summary (Signed)
Physician Discharge Summary  Chelsea Allison SAY:[id number] DOB: [birth year] DOA: 08/10/2019  PCP: Beckie Salts, MD  Admit date: 08/10/2019 Discharge date: 08/25/2019  Admitted From: Home Discharge disposition: SNF   Code Status: Full Code  Diet Recommendation: Cardiac/diabetic diet   Recommendations for Outpatient Follow-Up:   1. Follow-up with PCP as an outpatient  Discharge Diagnosis:   Principal Problem:   Acute respiratory failure ([place]) Active Problems:   COPD with acute exacerbation ([place])   CAP (community acquired pneumonia)   CHF exacerbation ([place])   AKI (acute kidney injury) ([place])   Elevated troponin    History of Present Illness / Brief narrative:  Patient is 86 yofemalewith a PMHx of CAD/STEMI in Oct. 2020 (s/p PCI), COPD on home O2, NSCLC s/p partial lung resection, CHF. She presented to the ED on 6/3 with shortness of breath. Found to have elevated troponin.   Admitted to hospitalist service. Subsequent cardiac cath was negative.  Managed for COPD exacerbation. However respiratory status worsened. 6/5, patient was intubated and transferred to ICU.  She was extubated and again reintubated 3 times.  Final successful extubation on 6/12.  Since then, patient has remained on submental oxygen by nasal cannula. 6/15, pt was transferred out of the ICU to [facility].  Hospital Course:  Acute on chronic hypoxic and hypercapnic respiratory failure Community-acquired pneumonia -Intubated x3due to recurrent respiratory distress,finally extubated on 6/12 -Currently on 2 L of oxygen, saturating well, noted intermittent tachypnea -Currently afebrile, with leukocytosis(on steroids) -Status post bronchoscopy on 6/8 which showed tracheobronchomalacia,mucous plugs,some fibrosis -Bronchial brushing done on 6/8 grew Enterococcus faecalis and6/10 grew GPC in chains -Completed the course of p.o. Augmentin on 6/17. -Continue nebulizer, inhalers, steroids (taperover next 1  week) -Continue supplemental oxygen, incentive spirometry, flutter valve  CAD status post PCI x2 in 2020 Elevated troponin -Shortness of breath and elevated troponin on admission -Cardiology consult appreciated. -Cardiac catheter this admission showed chronic LAD blockage, no significant new lesions,EF worsened to 30 to 35% -Troponin elevation was deemed secondary to COPD exacerbation. -Recommend to continue aspirin, Crestor, Brilinta  Acute on chronic systolic HF Essential hypertension -Prior to admission, patient was on Lasix 20 mg daily at home along with metoprolol and losartan.   -Adequately diuresed in the hospital with IV Lasix.   -Currently IV Lasix on hold yesterday and today for elevated creatinine.  Creatinine improving today.   -Resume Lasix from 6/20.   -Continue metoprolol and losartan.    Diabetes mellitus type 2 - A1c 5.8 on 08/11/19 -Not on any medications at home.   -Currently on sliding scale insulin because of hyperglycemia related to steroids.    GERD -Continue home PPI  Opioid dependence -Restart buprenorphine patch  Deconditioning PT/OT-recommend SNF.  Mobility: Encourage ambulation.   Code Status: Full code  Stable discharge to SNF today.  Subjective:  Seen and examined this morning.  Pleasant middle-aged Caucasian female.  Not in distress.  No new symptoms.  Discharge Exam:   Vitals:   08/25/19 0355 08/25/19 0812 08/25/19 0846 08/25/19 1058  BP: 109/68 113/60  (!) 108/54  Pulse: 75 96  94  Resp: _0 Temp: 98 F (36.7 C) 98.3 F (36.8 C)  98.3 F (36.8 C)  TempSrc: Oral Oral  Oral  SpO2: 94% (!) 89% 93%   Weight: 79.2 kg     Height:        Body mass index is 25.78 kg/m.  General exam: Appears calm and comfortable.  Skin: No rashes, lesions  or ulcers. HEENT: Atraumatic, normocephalic, supple neck, no obvious bleeding Lungs: Clear to auscultate bilaterally CVS: Regular rate and rhythm, no murmur GI/Abd soft, nontender,  nondistended, bowel sound present CNS: Alert, awake, oriented x3 Psychiatry: Mood appropriate Extremities: No pedal edema, no calf tenderness  Discharge Instructions:  Wound care: None     Discharge Instructions    Diet - low sodium heart healthy   Complete by: As directed    Diet Carb Modified   Complete by: As directed    Increase activity slowly   Complete by: As directed       Follow-up Information    Belva Crome, MD Follow up.   Specialty: Cardiology Why: The office should call you within 2-3 business days to arrange follow-up. If you do not hear from them within that time frame, please call the number provided.  Contact information: [address] [place] [address] [phone]        Beckie Salts, MD Follow up.   Specialty: Internal Medicine             Allergies as of 08/25/2019      Reactions   Statins Other (See Comments)   Hydrochlorothiazide Other (See Comments)   headaches   Hydrocodone Nausea And Vomiting   Lisinopril Other (See Comments)   Headache   Oxycodone Nausea Only      Medication List    STOP taking these medications   ALPRAZolam 0.5 MG tablet Commonly known as: XANAX     TAKE these medications   acetaminophen 500 MG tablet Commonly known as: TYLENOL Take 1,000 mg by mouth daily as needed for mild pain.   albuterol 108 (90 Base) MCG/ACT inhaler Commonly known as: VENTOLIN HFA Inhale 2 puffs into the lungs every 4 (four) hours as needed for shortness of breath or wheezing.   aspirin 81 MG chewable tablet Chew 1 tablet (81 mg total) by mouth daily.   B-12 PO Take 1 tablet by mouth every other day.   Breo Ellipta 100-25 MCG/INH Aepb Generic drug: fluticasone furoate-vilanterol Inhale 1 puff into the lungs daily.   buprenorphine 20 MCG/HR Ptwk Commonly known as: BUTRANS Place 1 patch onto the skin once a week for 7 days.   docusate sodium 100 MG capsule Commonly known as: COLACE Take 1 capsule  (100 mg total) by mouth 2 (two) times daily.   feeding supplement (ENSURE ENLIVE) Liqd Take 237 mLs by mouth 3 (three) times daily between meals.   furosemide 20 MG tablet Commonly known as: LASIX Take 1 tablet (20 mg total) by mouth daily. Start taking on: August 27, 2019 What changed:   medication strength  how much to take  These instructions start on August 27, 2019. If you are unsure what to do until then, ask your doctor or other care provider.   guaiFENesin 600 MG 12 hr tablet Commonly known as: MUCINEX Take 1 tablet (600 mg total) by mouth 2 (two) times daily.   insulin aspart 100 UNIT/ML injection Commonly known as: novoLOG Inject 3-9 Units into the skin 4 (four) times daily -  before meals and at bedtime.   ipratropium-albuterol 0.5-2.5 (3) MG/3ML Soln Commonly known as: DUONEB Inhale 3 mLs into the lungs every 4 (four) hours as needed for shortness of breath or wheezing.   losartan 25 MG tablet Commonly known as: COZAAR Take 0.5 tablets (12.5 mg total) by mouth daily.   melatonin 3 MG Tabs tablet Take 1 tablet (3 mg total)  by mouth at bedtime as needed (insomnia/anxiety).   metoprolol succinate 25 MG 24 hr tablet Commonly known as: TOPROL-XL Take 1 tablet (25 mg total) by mouth daily.   multivitamin with minerals Tabs tablet Take 1 tablet by mouth daily.   nicotine 21 mg/24hr patch Commonly known as: NICODERM CQ - dosed in mg/24 hours Place 1 patch (21 mg total) onto the skin daily.   nitroGLYCERIN 0.4 MG SL tablet Commonly known as: NITROSTAT DISSOLVE ONE TABLET UNDER TONGUE EVERY 5 MINUTES AS NEEDED UP TO 3 DOSES. IF NORELIEF CALL 911. What changed: See the new instructions.   pantoprazole 40 MG tablet Commonly known as: PROTONIX Take 1 tablet (40 mg total) by mouth daily. What changed:   when to take this  reasons to take this   polyethylene glycol 17 g packet Commonly known as: MIRALAX / GLYCOLAX Take 17 g by mouth daily as needed for moderate  constipation.   potassium chloride SA 20 MEQ tablet Commonly known as: KLOR-CON Take 1 tablet (20 mEq total) by mouth daily.   predniSONE 10 MG tablet Commonly known as: DELTASONE Take 4 tabs daily for 2 days, then take 3 tabs daily for 2 days, then take 2 tabs daily for 2 days, then take 1 tab daily for 2 days, then STOP.   rosuvastatin 10 MG tablet Commonly known as: CRESTOR Take 1 tablet (10 mg total) by mouth daily at 6 PM.   rosuvastatin 40 MG tablet Commonly known as: CRESTOR Take 20 mg by mouth daily.   sodium chloride 0.65 % Soln nasal spray Commonly known as: OCEAN Place 1 spray into both nostrils as needed for congestion.   sucralfate 1 g tablet Commonly known as: CARAFATE Take 1 g by mouth 4 (four) times daily as needed.   ticagrelor 90 MG Tabs tablet Commonly known as: BRILINTA Take 1 tablet (90 mg total) by mouth 2 (two) times daily.   umeclidinium bromide 62.5 MCG/INH Aepb Commonly known as: INCRUSE ELLIPTA Inhale 1 puff into the lungs daily. Start taking on: August 26, 2019   Vitamin D (Ergocalciferol) 1.25 MG (50000 UNIT) Caps capsule Commonly known as: DRISDOL Take 50,000 Units by mouth every 7 (seven) days.       Time coordinating discharge: 35 minutes  The results of significant diagnostics from this hospitalization (including imaging, microbiology, ancillary and laboratory) are listed below for reference.    Procedures and Diagnostic Studies:   CT Angio Chest PE W/Cm &/Or Wo Cm  Result Date: 08/11/2019 CLINICAL DATA:  Shortness of breath EXAM: CT ANGIOGRAPHY CHEST WITH CONTRAST TECHNIQUE: Multidetector CT imaging of the chest was performed using the standard protocol during bolus administration of intravenous contrast. Multiplanar CT image reconstructions and MIPs were obtained to evaluate the vascular anatomy. CONTRAST:  48m OMNIPAQUE IOHEXOL 350 MG/ML SOLN COMPARISON:  Radiograph same day, CT May 09, 2019 FINDINGS: Cardiovascular: There is a  optimal opacification of the pulmonary arteries. There is no central,segmental, or subsegmental filling defects within the pulmonary arteries. The heart is normal in size. No pericardial effusion or thickening. No evidence right heart strain. There is normal three-vessel brachiocephalic anatomy without proximal stenosis. Aortic atherosclerosis is noted. There is calcifications seen at the mitral valve and coronary artery calcifications. Mediastinum/Nodes: No hilar, mediastinal, or axillary adenopathy. Thyroid gland, trachea, and esophagus demonstrate no significant findings. Lungs/Pleura: Again noted is right medial suprahilar scarring and bronchiectasis with surgical sutures. There is no significant change in the ill-defined nodular opacity in the posterior left lower  lobe measuring 1 cm, series 6, image 61. There is also a small 8 mm ill-defined opacity seen within the right middle lobe. There is mild hazy ill-defined ground-glass opacity with tree-in-bud opacities seen throughout both lungs. No pleural effusion. Upper Abdomen: No acute abnormalities present in the visualized portions of the upper abdomen. Musculoskeletal: No chest wall abnormality. No acute or significant osseous findings. Review of the MIP images confirms the above findings. IMPRESSION: No central, segmental, or subsegmental pulmonary embolism. Mild ground-glass tree-in-bud opacities seen throughout both lungs which may be due to infectious or inflammatory process. Stable ill-defined pulmonary nodules in the posterior left lower lobe and right middle lobe as described above. Aortic Atherosclerosis (ICD10-I70.0). Electronically Signed   By: Prudencio Pair M.D.   On: 08/11/2019 04:10   CARDIAC CATHETERIZATION  Result Date: 08/11/2019  Previously placed Prox RCA stent (unknown type) is widely patent.  Previously placed Prox Cx to Mid Cx stent (unknown type) is widely patent.  Mid Cx to Dist Cx lesion is 60% stenosed.  Prox LAD lesion is 100%  stenosed.  Patent proximal RCA stent Patent Circumflex stent. Unchanged moderate mid Circumflex stenosis Chronic occlusion proximal LAD. The distal LAD and branches fill from right to left collaterals. Recommendations: Elevated troponin likely due to demand ischemia in setting of known LAD chronic occlusion. Her presentation is most c/w COPD exacerbation. Continue medical management of CAD.   DG Chest Port 1 View  Result Date: 08/10/2019 CLINICAL DATA:  Shortness of breath, right upper lobe cancer status post lobectomy with mediastinal recurrence EXAM: PORTABLE CHEST 1 VIEW COMPARISON:  07/06/2019, 05/09/2019 FINDINGS: Single frontal view of the chest demonstrates postsurgical changes from right upper lobectomy. Increased soft tissue density with spiculated margins at the right hilum, worrisome for progression of known mediastinal recurrence. Progressive post radiation changes fell also be considered. No new airspace disease, effusion, or pneumothorax. No acute bony abnormalities. IMPRESSION: 1. Increased density at the right hilum, with increasing spiculated margins. Differential includes progressive post radiation change versus Recurrent disease. Electronically Signed   By: Randa Ngo M.D.   On: 08/10/2019 22:07     Labs:   Basic Metabolic Panel: Recent Labs  Lab 08/20/19 0343 08/20/19 0343 08/22/19 0357 08/22/19 0357 08/23/19 0335 08/23/19 0335 08/24/19 0308 08/25/19 0308  NA 135  --  132*  --  130*  --  131* 129*  K 4.2   < > 4.7   < > 3.7   < > 4.3 3.9  CL 96*  --  96*  --  94*  --  94* 92*  CO2 27  --  26  --  28  --  28 28  GLUCOSE 141*  --  127*  --  106*  --  121* 108*  BUN 54*  --  47*  --  41*  --  38* 34*  CREATININE 0.98  --  0.99  --  1.08*  --  1.21* 1.15*  CALCIUM 8.8*  --  9.7  --  9.2  --  9.0 9.0  MG  --   --  2.6*  --   --   --   --   --    < > = values in this interval not displayed.   GFR Estimated Creatinine Clearance: 52.3 mL/min (A) (by C-G formula based  on SCr of 1.15 mg/dL (H)). Liver Function Tests: Recent Labs  Lab 08/20/19 0343 08/22/19 0357  AST 19 21  ALT 24 33  ALKPHOS 49 52  BILITOT 0.9 0.7  PROT 6.3* 6.2*  ALBUMIN 3.3* 3.3*   No results for input(s): LIPASE, AMYLASE in the last 168 hours. No results for input(s): AMMONIA in the last 168 hours. Coagulation profile No results for input(s): INR, PROTIME in the last 168 hours.  CBC: Recent Labs  Lab 08/20/19 0343 08/22/19 0357 08/23/19 0335 08/24/19 0308 08/25/19 0308  WBC 22.2* 25.4* 20.8* 19.4* 21.7*  NEUTROABS  --   --  15.7* 16.8* 17.6*  HGB 12.8 13.6 13.7 13.2 13.4  HCT 43.0 43.3 44.3 42.0 42.6  MCV 85.8 82.3 83.7 82.8 82.1  PLT 231 213 253 219 263   Cardiac Enzymes: No results for input(s): CKTOTAL, CKMB, CKMBINDEX, TROPONINI in the last 168 hours. BNP: Invalid input(s): POCBNP CBG: Recent Labs  Lab 08/24/19 1134 08/24/19 1607 08/24/19 2206 08/25/19 0608 08/25/19 1103  GLUCAP 178* 176* 145* 97 120*   D-Dimer No results for input(s): DDIMER in the last 72 hours. Hgb A1c No results for input(s): HGBA1C in the last 72 hours. Lipid Profile No results for input(s): CHOL, HDL, LDLCALC, TRIG, CHOLHDL, LDLDIRECT in the last 72 hours. Thyroid function studies No results for input(s): TSH, T4TOTAL, T3FREE, THYROIDAB in the last 72 hours.  Invalid input(s): FREET3 Anemia work up No results for input(s): VITAMINB12, FOLATE, FERRITIN, TIBC, IRON, RETICCTPCT in the last 72 hours. Microbiology Recent Results (from the past 240 hour(s))  Culture, respiratory     Status: None   Collection Time: 08/15/19  7:18 PM   Specimen: Bronchial Brush; Respiratory  Result Value Ref Range Status   Specimen Description BRONCHIAL BRUSHING  Final   Special Requests LEFT  Final   Gram Stain   Final    RARE WBC PRESENT, PREDOMINANTLY PMN NO ORGANISMS SEEN Performed at [facility], [place] [address] [place], [place] [address]    Culture FEW ENTEROCOCCUS FAECALIS   Final   Report Status 08/18/2019 FINAL  Final   Organism ID, Bacteria ENTEROCOCCUS FAECALIS  Final      Susceptibility   Enterococcus faecalis - MIC*    AMPICILLIN <=2 SENSITIVE Sensitive     VANCOMYCIN 1 SENSITIVE Sensitive     GENTAMICIN SYNERGY SENSITIVE Sensitive     * FEW ENTEROCOCCUS FAECALIS  Culture, respiratory     Status: None   Collection Time: 08/17/19  7:32 PM   Specimen: Bronchoalveolar Lavage; Respiratory  Result Value Ref Range Status   Specimen Description BRONCHIAL ALVEOLAR LAVAGE  Final   Special Requests NONE  Final   Gram Stain   Final    NO WBC SEEN RARE GRAM POSITIVE COCCI IN CHAINS IN PAIRS Performed at [facility], [address] [place], [place] [address]    Culture FEW ENTEROCOCCUS FAECALIS  Final   Report Status 08/21/2019 FINAL  Final   Organism ID, Bacteria ENTEROCOCCUS FAECALIS  Final      Susceptibility   Enterococcus faecalis - MIC*    AMPICILLIN <=2 SENSITIVE Sensitive     VANCOMYCIN 1 SENSITIVE Sensitive     GENTAMICIN SYNERGY SENSITIVE Sensitive     * FEW ENTEROCOCCUS FAECALIS  SARS CORONAVIRUS 2 (TAT 6-24 HRS) Nasopharyngeal Nasopharyngeal Swab     Status: None   Collection Time: 08/24/19  3:22 PM   Specimen: Nasopharyngeal Swab  Result Value Ref Range Status   SARS Coronavirus 2 NEGATIVE NEGATIVE Final    Comment: (NOTE) SARS-CoV-2 target nucleic acids are NOT DETECTED.  The SARS-CoV-2 RNA is generally detectable in upper and lower respiratory  specimens during the acute phase of infection. Negative results do not preclude SARS-CoV-2 infection, do not rule out co-infections with other pathogens, and should not be used as the sole basis for treatment or other patient management decisions. Negative results must be combined with clinical observations, patient history, and epidemiological information. The expected result is Negative.  Fact Sheet for Patients: SugarRoll.be  Fact Sheet for Healthcare  Providers: https://www.woods-mathews.com/  This test is not yet approved or cleared by the [place] FDA and  has been authorized for detection and/or diagnosis of SARS-CoV-2 by FDA under an Emergency Use Authorization (EUA). This EUA will remain  in effect (meaning this test can be used) for the duration of the COVID-19 declaration under Se ction 564(b)(1) of the Act, 21 U.S.C. section 360bbb-3(b)(1), unless the authorization is terminated or revoked sooner.  Performed at [facility], [place] [address] [place], [place] [address]     Please note: You were cared for by a hospitalist during your hospital stay. Once you are discharged, your primary care physician will handle any further medical issues. Please note that NO REFILLS for any discharge medications will be authorized once you are discharged, as it is imperative that you return to your primary care physician (or establish a relationship with a primary care physician if you do not have one) for your post hospital discharge needs so that they can reassess your need for medications and monitor your lab values.  Signed: Marlowe Aschoff Remi Lopata  [facility] 08/25/2019, 11:08 AM

## 2019-08-25 NOTE — Progress Notes (Signed)
Occupational Therapy Treatment Patient Details Name: Chelsea Allison MRN: [id number] DOB: [birth year] Today's Date: 08/25/2019    History of present illness Pt adm 6/4 with acute copd exacerbation. Respiratory distress on 6/5 and intubated 6/5-6/6. Reintubated 6/7- 6/8. Reintubated 6/8-6/12. PMH - NSTEMI, peripheral neuropathy, DM, HTN, COPD, colon CA, lung CA, CAD.    OT comments  Pt. Seen for skilled OT treatment session.  Expressed sadness for having to snf and no visitors allowed.  Also expresses concerns for getting better.  Motivated and eager to return home.  Agreeable to oob with bsc transfer all with min guard A.  Provided education on energy conservation strategies and breathing tech.  Pt. Receptive and utilized immediately.    Follow Up Recommendations  SNF;Supervision/Assistance - 24 hour    Equipment Recommendations  Tub/shower seat    Recommendations for Other Services      Precautions / Restrictions Precautions Precautions: Fall Precaution Comments: anxiety and monitor sats, pulses Restrictions Weight Bearing Restrictions: No       Mobility Bed Mobility Overal bed mobility: Modified Independent                Transfers   Equipment used: Rolling walker (2 wheeled) Transfers: Sit to/from [facility] Sit to Stand: Min guard Stand pivot transfers: Min guard       General transfer comment: cues to manage o2 tubing with rw. has o2 at home but never managed with rw used to walk without a device    Balance                                           ADL either performed or assessed with clinical judgement   ADL Overall ADL's : Needs assistance/impaired                         Toilet Transfer: Min guard;RW;BSC   Toileting- Clothing Manipulation and Hygiene: Supervision/safety;Sit to/from stand         General ADL Comments: provided examples of energy conseration, answered pt. questions about preferred  breathing strategies.  pt. implementing recommendations by the end of session. ie: had advised her to motion or let someone know if she could not answer them and to focus and "detach" from the conversation so she could focus on breathing in/out. after bsc transfer i asked her a question and she signaled that she needed a min. after a few breaths she said "i felt anxious i felt it coming on so i stopped talking and focused on my breathing" she was smiling and verbalized feeling good that she had payed attention to what her body was telling her (as previosly explained during energy conservation portion) and that i had really helped her!;)     Vision       Perception     Praxis      Cognition   Behavior During Therapy: [facility] for tasks assessed/performed Overall Cognitive Status: Within Functional Limits for tasks assessed                                 General Comments: exprssed feeling "depressed" about having to go to snf and really bothered that she can not have visitors.  reviewed with her that her feelings were valid.  provided examples of how she can still  stay connected to her family and also how she can use it for motivation to get home!        Exercises     Shoulder Instructions       General Comments      Pertinent Vitals/ Pain       Pain Assessment: No/denies pain  Home Living                                          Prior Functioning/Environment              Frequency  Min 2X/week        Progress Toward Goals  OT Goals(current goals can now be found in the care plan section)  Progress towards OT goals: Progressing toward goals     Plan      Co-evaluation                 AM-PAC OT "6 Clicks" Daily Activity     Outcome Measure   Help from another person eating meals?: None Help from another person taking care of personal grooming?: A Little Help from another person toileting, which includes using toliet,  bedpan, or urinal?: A Little Help from another person bathing (including washing, rinsing, drying)?: A Lot Help from another person to put on and taking off regular upper body clothing?: A Little Help from another person to put on and taking off regular lower body clothing?: A Lot 6 Click Score: 17    End of Session Equipment Utilized During Treatment: Rolling walker;Oxygen  OT Visit Diagnosis: Muscle weakness (generalized) (M62.81);Unsteadiness on feet (R26.81)   Activity Tolerance Patient tolerated treatment well;Patient limited by fatigue   Patient Left in bed;with call bell/phone within reach   Nurse Communication          Time: 1173-5670 OT Time Calculation (min): 19 min  Charges: OT General Charges $OT Visit: 1 Visit OT Treatments $Self Care/Home Management : 8-22 mins  Sonia Baller, COTA/L Acute Rehabilitation [phone]   Janice Coffin 08/25/2019, 2:22 PM

## 2019-08-25 NOTE — Progress Notes (Signed)
Physical Therapy Treatment Patient Details Name: Chelsea Allison MRN: [id number] DOB: 1955-10-09 Today's Date: 08/25/2019    History of Present Illness Pt adm 6/4 with acute copd exacerbation. Respiratory distress on 6/5 and intubated 6/5-6/6. Reintubated 6/7- 6/8. Reintubated 6/8-6/12. PMH - NSTEMI, peripheral neuropathy, DM, HTN, COPD, colon CA, lung CA, CAD.     PT Comments    Pt was seen for mobility on hallway, with sats checked and noted decline during O2 supplemented gait.  Her plan is to get to SNF for rehab but pt is expressing regret at not going directly home. Will continue rehab during her stay to work on better control of vitals with endurance work, to increase LE strength and to improve her strategies to control use of O2 and resulting sats with all mobility.    Follow Up Recommendations  SNF     Equipment Recommendations  None recommended by PT    Recommendations for Other Services       Precautions / Restrictions Precautions Precautions: Fall Precaution Comments: monitor pulses and sats to walk Restrictions Weight Bearing Restrictions: No    Mobility  Bed Mobility Overal bed mobility: Modified Independent                Transfers Overall transfer level: Modified independent Equipment used: Rolling walker (2 wheeled) Transfers: Sit to/from [facility] Sit to Stand: Supervision;Min guard Stand pivot transfers: Min guard       General transfer comment: having O2 sats drop with effort even on O2 via cannula  Ambulation/Gait Ambulation/Gait assistance: Min assist Gait Distance (Feet): 100 Feet Assistive device: Rolling walker (2 wheeled) Gait Pattern/deviations: Step-through pattern;Decreased stride length;Wide base of support Gait velocity: careful Gait velocity interpretation: <1.31 ft/sec, indicative of household ambulator General Gait Details: fast turns with some cues for safety   Stairs             Wheelchair  Mobility    Modified Rankin (Stroke Patients Only)       Balance Overall balance assessment: Needs assistance Sitting-balance support: Feet supported Sitting balance-Leahy Scale: Good     Standing balance support: Bilateral upper extremity supported;During functional activity Standing balance-Leahy Scale: Fair Standing balance comment: less than fair with dynamic tasks                            Cognition Arousal/Alertness: Awake/alert Behavior During Therapy: WFL for tasks assessed/performed Overall Cognitive Status: Within Functional Limits for tasks assessed                                 General Comments: asking about how long rehab will take      Exercises      General Comments General comments (skin integrity, edema, etc.): O2 sats on tank air were good initially but drops to 88% with walking.        Pertinent Vitals/Pain Pain Assessment: No/denies pain    Home Living                      Prior Function            PT Goals (current goals can now be found in the care plan section) Acute Rehab PT Goals Patient Stated Goal: go home Progress towards PT goals: Progressing toward goals    Frequency    Min 3X/week      PT Plan Current  plan remains appropriate    Co-evaluation              AM-PAC PT "6 Clicks" Mobility   Outcome Measure  Help needed turning from your back to your side while in a flat bed without using bedrails?: None Help needed moving from lying on your back to sitting on the side of a flat bed without using bedrails?: A Little Help needed moving to and from a bed to a chair (including a wheelchair)?: A Little Help needed standing up from a chair using your arms (e.g., wheelchair or bedside chair)?: A Little Help needed to walk in hospital room?: A Little Help needed climbing 3-5 steps with a railing? : Total 6 Click Score: 17    End of Session Equipment Utilized During Treatment:  Oxygen;Gait belt Activity Tolerance: Patient limited by fatigue;Treatment limited secondary to medical complications (Comment) Patient left: in bed;with call bell/phone within reach;with bed alarm set Nurse Communication: Mobility status PT Visit Diagnosis: Unsteadiness on feet (R26.81);Other abnormalities of gait and mobility (R26.89);Muscle weakness (generalized) (M62.81)     Time: 7846-9629 PT Time Calculation (min) (ACUTE ONLY): 20 min  Charges:  $Gait Training: 8-22 mins                    Ramond Dial 08/25/2019, 4:42 PM  Mee Hives, PT MS Acute Rehab Dept. Number: [place] [facility] [place]

## 2019-08-28 ENCOUNTER — Encounter: Payer: Self-pay | Admitting: Internal Medicine

## 2019-08-28 NOTE — Progress Notes (Signed)
Location:    [facility] Room Number: 425/W Place of Service:  SNF [phone]) Provider:  Vaughan Basta, MD  Patient Care Team: Beckie Salts, MD as PCP - General (Internal Medicine) Belva Crome, MD as PCP - Cardiology (Cardiology)  Extended Emergency Contact Information Primary Emergency Contact: Trotter,Kathy Mobile Phone: [phone] Relation: Aunt Secondary Emergency Contact: Edward Jolly Mobile Phone: [phone] Relation: Uncle  Code Status:  Full Code Goals of care: Advanced Directive information Advanced Directives 08/28/2019  Does Patient Have a Medical Advance Directive? Yes  Type of Advance Directive (No Data)  Does patient want to make changes to medical advance directive? No - Patient declined  Would patient like information on creating a medical advance directive? -     Chief Complaint  Patient presents with   Hospitalization Follow-up    Hospitalization Follow Up    HPI:  Pt is a 64 y.o. female seen today for a hospital f/u s/p admission from  Past Medical History:  Diagnosis Date   CAD (coronary artery disease) 10/20 DES to pRCA, DES mCx    Cancer of lung ([place])    Twice   Colon cancer ([place])    COPD (chronic obstructive pulmonary disease) ([place])    Hyperlipidemia    Hypertension    Ischemic cardiomyopathy    Neuropathy    Secondary to chemo   STEMI (ST elevation myocardial infarction) ([place]) 12/12/2018   Tobacco abuse    Past Surgical History:  Procedure Laterality Date   ABDOMINAL SURGERY     For colon cancer   COLOSTOMY     CORONARY STENT INTERVENTION N/A 12/20/2018   Procedure: CORONARY STENT INTERVENTION - Right;  Surgeon: Burnell Blanks, MD;  Location: [facility];  Service: Cardiovascular;  Laterality: N/A;   CORONARY/GRAFT ACUTE MI REVASCULARIZATION N/A 12/12/2018   Procedure: Coronary/Graft Acute MI Revascularization;  Surgeon: Belva Crome, MD;  Location: [facility];  Service: Cardiovascular;  Laterality: N/A;   LEFT HEART CATH AND CORONARY ANGIOGRAPHY N/A 12/12/2018   Procedure: LEFT HEART CATH AND CORONARY ANGIOGRAPHY;  Surgeon: Belva Crome, MD;  Location: [facility];  Service: Cardiovascular;  Laterality: N/A;   LEFT HEART CATH AND CORONARY ANGIOGRAPHY N/A 08/11/2019   Procedure: LEFT HEART CATH AND CORONARY ANGIOGRAPHY;  Surgeon: Burnell Blanks, MD;  Location: [facility];  Service: Cardiovascular;  Laterality: N/A;    Allergies  Allergen Reactions   Statins Other (See Comments)   Hydrochlorothiazide Other (See Comments)    headaches   Hydrocodone Nausea And Vomiting   Lisinopril Other (See Comments)    Headache    Oxycodone Nausea Only    Allergies as of 08/28/2019      Reactions   Statins Other (See Comments)   Hydrochlorothiazide Other (See Comments)   headaches   Hydrocodone Nausea And Vomiting   Lisinopril Other (See Comments)   Headache   Oxycodone Nausea Only      Medication List       Accurate as of August 28, 2019  8:47 AM. If you have any questions, ask your nurse or doctor.        STOP taking these medications   nitroGLYCERIN 0.4 MG SL tablet Commonly known as: NITROSTAT Stopped by: Granville Lewis, PA-C     TAKE these medications   acetaminophen 500 MG tablet Commonly known as: TYLENOL Take 1,000 mg by mouth every 4 (four) hours as needed for  mild pain.   albuterol 108 (90 Base) MCG/ACT inhaler Commonly known as: VENTOLIN HFA Inhale 2 puffs into the lungs every 4 (four) hours as needed for shortness of breath or wheezing.   aspirin 81 MG chewable tablet Chew 1 tablet (81 mg total) by mouth daily.   B-12 PO Take 1 tablet by mouth every other day.   Breo Ellipta 100-25 MCG/INH Aepb Generic drug: fluticasone furoate-vilanterol Inhale 1 puff into the lungs daily.   buprenorphine 20 MCG/HR Ptwk Commonly known as: BUTRANS Place 1 patch onto the skin once a week for  7 days.   docusate sodium 100 MG capsule Commonly known as: COLACE Take 1 capsule (100 mg total) by mouth 2 (two) times daily.   feeding supplement (ENSURE ENLIVE) Liqd Take 237 mLs by mouth 3 (three) times daily between meals.   furosemide 20 MG tablet Commonly known as: LASIX Take 1 tablet (20 mg total) by mouth daily.   guaiFENesin 600 MG 12 hr tablet Commonly known as: MUCINEX Take 1 tablet (600 mg total) by mouth 2 (two) times daily.   ipratropium-albuterol 0.5-2.5 (3) MG/3ML Soln Commonly known as: DUONEB Inhale 3 mLs into the lungs every 4 (four) hours as needed for shortness of breath or wheezing.   losartan 25 MG tablet Commonly known as: COZAAR Take 0.5 tablets (12.5 mg total) by mouth daily.   melatonin 3 MG Tabs tablet Take 1 tablet (3 mg total) by mouth at bedtime as needed (insomnia/anxiety).   metoprolol succinate 25 MG 24 hr tablet Commonly known as: TOPROL-XL Take 1 tablet (25 mg total) by mouth daily.   multivitamin with minerals Tabs tablet Take 1 tablet by mouth daily.   nicotine 21 mg/24hr patch Commonly known as: NICODERM CQ - dosed in mg/24 hours Place 1 patch (21 mg total) onto the skin daily.   NON FORMULARY DIET:Regular NAS Heart Healthy CCD diet   NovoLOG FlexPen 100 UNIT/ML FlexPen Generic drug: insulin aspart Inject 3 Units into the skin 3 (three) times daily before meals. Only if cbg is greater than 150 x 8 more days while on prednisone What changed: Another medication with the same name was removed. Continue taking this medication, and follow the directions you see here. Changed by: Granville Lewis, PA-C   pantoprazole 40 MG tablet Commonly known as: PROTONIX Take 1 tablet (40 mg total) by mouth daily.   polyethylene glycol 17 g packet Commonly known as: MIRALAX / GLYCOLAX Take 17 g by mouth daily as needed for moderate constipation.   potassium chloride SA 20 MEQ tablet Commonly known as: KLOR-CON Take 1 tablet (20 mEq total) by  mouth daily.   predniSONE 10 MG tablet Commonly known as: DELTASONE Take 4 tabs daily for 2 days, then take 3 tabs daily for 2 days, then take 2 tabs daily for 2 days, then take 1 tab daily for 2 days, then STOP.   rosuvastatin 40 MG tablet Commonly known as: CRESTOR Take 20 mg by mouth daily. What changed: Another medication with the same name was removed. Continue taking this medication, and follow the directions you see here. Changed by: Granville Lewis, PA-C   sodium chloride 0.65 % Soln nasal spray Commonly known as: OCEAN Place 1 spray into both nostrils as needed for congestion.   sucralfate 1 g tablet Commonly known as: CARAFATE Take 1 g by mouth as needed. For stomach discomfort   ticagrelor 90 MG Tabs tablet Commonly known as: BRILINTA Take 1 tablet (90 mg total) by mouth  2 (two) times daily.   umeclidinium bromide 62.5 MCG/INH Aepb Commonly known as: INCRUSE ELLIPTA Inhale 1 puff into the lungs daily.   Vitamin D (Ergocalciferol) 1.25 MG (50000 UNIT) Caps capsule Commonly known as: DRISDOL Take 50,000 Units by mouth every 7 (seven) days.       Review of Systems   There is no immunization history on file for this patient. Pertinent  Health Maintenance Due  Topic Date Due   PAP SMEAR-Modifier  Never done   MAMMOGRAM  Never done   COLONOSCOPY  Never done   INFLUENZA VACCINE  10/08/2019   No flowsheet data found. Functional Status Survey:    Vitals:   08/28/19 0845  BP: (!) 98/53  Pulse: 72  Resp: 18  Temp: 97.7 F (36.5 C)  TempSrc: Oral  SpO2: 96%  Weight: 174 lb 9.6 oz (79.2 kg)  Height: 5\' 9"  (1.753 m)   Body mass index is 25.78 kg/m. Physical Exam  Labs reviewed: Recent Labs    08/13/19 0225 08/13/19 0502 08/16/19 0222 08/16/19 1205 08/18/19 0241 08/19/19 0203 08/22/19 6503 08/22/19 0357 08/23/19 0335 08/24/19 0308 08/25/19 0308  NA 142   < > 146*   < > 144   < > 132*   < > 130* 131* 129*  K 3.8   < > 4.1   < > 3.8   < > 4.7    < > 3.7 4.3 3.9  CL 107   < > 110  --  112*   < > 96*   < > 94* 94* 92*  CO2 21*   < > 23  --  24   < > 26   < > 28 28 28   GLUCOSE 165*   < > 140*  --  199*   < > 127*   < > 106* 121* 108*  BUN 33*   < > 52*  --  54*   < > 47*   < > 41* 38* 34*  CREATININE 1.06*   < > 1.18*  --  1.10*   < > 0.99   < > 1.08* 1.21* 1.15*  CALCIUM 9.4   < > 9.3  --  8.9   < > 9.7   < > 9.2 9.0 9.0  MG 2.3   < > 2.7*  --  2.7*  --  2.6*  --   --   --   --   PHOS 3.5  --  1.6*  --  3.2  --   --   --   --   --   --    < > = values in this interval not displayed.   Recent Labs    08/12/19 0727 08/20/19 0343 08/22/19 0357  AST 47* 19 21  ALT 28 24 33  ALKPHOS 96 49 52  BILITOT 0.5 0.9 0.7  PROT 7.2 6.3* 6.2*  ALBUMIN 3.4* 3.3* 3.3*   Recent Labs    08/23/19 0335 08/24/19 0308 08/25/19 0308  WBC 20.8* 19.4* 21.7*  NEUTROABS 15.7* 16.8* 17.6*  HGB 13.7 13.2 13.4  HCT 44.3 42.0 42.6  MCV 83.7 82.8 82.1  PLT 253 219 263   No results found for: TSH Lab Results  Component Value Date   HGBA1C 5.8 (H) 08/11/2019   Lab Results  Component Value Date   CHOL 239 (H) 12/13/2018   HDL 38 (L) 12/13/2018   LDLCALC 172 (H) 12/13/2018   TRIG 248 (H) 08/17/2019   CHOLHDL 6.3 12/13/2018  Significant Diagnostic Results in last 30 days:  CT Angio Chest PE W/Cm &/Or Wo Cm  Result Date: 08/11/2019 CLINICAL DATA:  Shortness of breath EXAM: CT ANGIOGRAPHY CHEST WITH CONTRAST TECHNIQUE: Multidetector CT imaging of the chest was performed using the standard protocol during bolus administration of intravenous contrast. Multiplanar CT image reconstructions and MIPs were obtained to evaluate the vascular anatomy. CONTRAST:  49mL OMNIPAQUE IOHEXOL 350 MG/ML SOLN COMPARISON:  Radiograph same day, CT May 09, 2019 FINDINGS: Cardiovascular: There is a optimal opacification of the pulmonary arteries. There is no central,segmental, or subsegmental filling defects within the pulmonary arteries. The heart is normal in size.  No pericardial effusion or thickening. No evidence right heart strain. There is normal three-vessel brachiocephalic anatomy without proximal stenosis. Aortic atherosclerosis is noted. There is calcifications seen at the mitral valve and coronary artery calcifications. Mediastinum/Nodes: No hilar, mediastinal, or axillary adenopathy. Thyroid gland, trachea, and esophagus demonstrate no significant findings. Lungs/Pleura: Again noted is right medial suprahilar scarring and bronchiectasis with surgical sutures. There is no significant change in the ill-defined nodular opacity in the posterior left lower lobe measuring 1 cm, series 6, image 61. There is also a small 8 mm ill-defined opacity seen within the right middle lobe. There is mild hazy ill-defined ground-glass opacity with tree-in-bud opacities seen throughout both lungs. No pleural effusion. Upper Abdomen: No acute abnormalities present in the visualized portions of the upper abdomen. Musculoskeletal: No chest wall abnormality. No acute or significant osseous findings. Review of the MIP images confirms the above findings. IMPRESSION: No central, segmental, or subsegmental pulmonary embolism. Mild ground-glass tree-in-bud opacities seen throughout both lungs which may be due to infectious or inflammatory process. Stable ill-defined pulmonary nodules in the posterior left lower lobe and right middle lobe as described above. Aortic Atherosclerosis (ICD10-I70.0). Electronically Signed   By: Prudencio Pair M.D.   On: 08/11/2019 04:10   CARDIAC CATHETERIZATION  Result Date: 08/11/2019  Previously placed Prox RCA stent (unknown type) is widely patent.  Previously placed Prox Cx to Mid Cx stent (unknown type) is widely patent.  Mid Cx to Dist Cx lesion is 60% stenosed.  Prox LAD lesion is 100% stenosed.  Patent proximal RCA stent Patent Circumflex stent. Unchanged moderate mid Circumflex stenosis Chronic occlusion proximal LAD. The distal LAD and branches fill  from right to left collaterals. Recommendations: Elevated troponin likely due to demand ischemia in setting of known LAD chronic occlusion. Her presentation is most c/w COPD exacerbation. Continue medical management of CAD.   DG Chest Port 1 View  Result Date: 08/10/2019 CLINICAL DATA:  Shortness of breath, right upper lobe cancer status post lobectomy with mediastinal recurrence EXAM: PORTABLE CHEST 1 VIEW COMPARISON:  07/06/2019, 05/09/2019 FINDINGS: Single frontal view of the chest demonstrates postsurgical changes from right upper lobectomy. Increased soft tissue density with spiculated margins at the right hilum, worrisome for progression of known mediastinal recurrence. Progressive post radiation changes fell also be considered. No new airspace disease, effusion, or pneumothorax. No acute bony abnormalities. IMPRESSION: 1. Increased density at the right hilum, with increasing spiculated margins. Differential includes progressive post radiation change versus Recurrent disease. Electronically Signed   By: Randa Ngo M.D.   On: 08/10/2019 22:07    Assessment/Plan There are no diagnoses linked to this encounter.   Family/ staff Communication:   Labs/tests ordered:       This encounter was created in error - please disregard.

## 2019-08-29 ENCOUNTER — Non-Acute Institutional Stay (SKILLED_NURSING_FACILITY): Payer: Medicare HMO | Admitting: Internal Medicine

## 2019-08-29 ENCOUNTER — Encounter: Payer: Self-pay | Admitting: Internal Medicine

## 2019-08-29 DIAGNOSIS — I472 Ventricular tachycardia, unspecified: Secondary | ICD-10-CM

## 2019-08-29 DIAGNOSIS — I5042 Chronic combined systolic (congestive) and diastolic (congestive) heart failure: Secondary | ICD-10-CM | POA: Diagnosis not present

## 2019-08-29 DIAGNOSIS — Z85048 Personal history of other malignant neoplasm of rectum, rectosigmoid junction, and anus: Secondary | ICD-10-CM | POA: Insufficient documentation

## 2019-08-29 DIAGNOSIS — J9622 Acute and chronic respiratory failure with hypercapnia: Secondary | ICD-10-CM | POA: Diagnosis not present

## 2019-08-29 DIAGNOSIS — Z720 Tobacco use: Secondary | ICD-10-CM

## 2019-08-29 DIAGNOSIS — J189 Pneumonia, unspecified organism: Secondary | ICD-10-CM | POA: Diagnosis not present

## 2019-08-29 DIAGNOSIS — Z85118 Personal history of other malignant neoplasm of bronchus and lung: Secondary | ICD-10-CM

## 2019-08-29 DIAGNOSIS — N179 Acute kidney failure, unspecified: Secondary | ICD-10-CM

## 2019-08-29 DIAGNOSIS — J441 Chronic obstructive pulmonary disease with (acute) exacerbation: Secondary | ICD-10-CM

## 2019-08-29 MED ORDER — ADULT MULTIVITAMIN W/MINERALS CH: 1.0000 | ORAL_TABLET | Freq: Every day | ORAL | 0 refills | Status: DC

## 2019-08-29 MED ORDER — ROSUVASTATIN CALCIUM 40 MG PO TABS: 20.0000 mg | ORAL_TABLET | Freq: Every day | ORAL | 0 refills | Status: DC

## 2019-08-29 MED ORDER — LOSARTAN POTASSIUM 25 MG PO TABS: 12.5000 mg | ORAL_TABLET | Freq: Every day | ORAL | 0 refills | Status: DC

## 2019-08-29 MED ORDER — B-12 1000 MCG PO CAPS
1.0000 | ORAL_CAPSULE | ORAL | 0 refills | Status: DC
Start: 2019-08-29 — End: 2019-10-04

## 2019-08-29 MED ORDER — SALINE SPRAY 0.65 % NA SOLN: 1.0000 | NASAL | 0 refills | Status: DC | PRN

## 2019-08-29 MED ORDER — FUROSEMIDE 20 MG PO TABS: 20.0000 mg | ORAL_TABLET | Freq: Every day | ORAL | 0 refills | Status: DC

## 2019-08-29 MED ORDER — PREDNISONE 10 MG PO TABS: ORAL_TABLET | ORAL | 0 refills | Status: DC

## 2019-08-29 MED ORDER — MELATONIN 3 MG PO TABS: 3.0000 mg | ORAL_TABLET | Freq: Every evening | ORAL | 0 refills | Status: DC | PRN

## 2019-08-29 MED ORDER — ASPIRIN 81 MG PO CHEW: 81.0000 mg | CHEWABLE_TABLET | Freq: Every day | ORAL | 0 refills | Status: DC

## 2019-08-29 MED ORDER — POTASSIUM CHLORIDE CRYS ER 20 MEQ PO TBCR: 20.0000 meq | EXTENDED_RELEASE_TABLET | Freq: Every day | ORAL | 0 refills | Status: DC

## 2019-08-29 MED ORDER — SUCRALFATE 1 G PO TABS: 1.0000 g | ORAL_TABLET | ORAL | 0 refills | Status: DC | PRN

## 2019-08-29 MED ORDER — PANTOPRAZOLE SODIUM 40 MG PO TBEC: 40.0000 mg | DELAYED_RELEASE_TABLET | Freq: Every day | ORAL | 0 refills | Status: DC

## 2019-08-29 MED ORDER — GUAIFENESIN ER 600 MG PO TB12: 600.0000 mg | ORAL_TABLET | Freq: Two times a day (BID) | ORAL | 0 refills | Status: DC

## 2019-08-29 MED ORDER — DOCUSATE SODIUM 100 MG PO CAPS: 100.0000 mg | ORAL_CAPSULE | Freq: Two times a day (BID) | ORAL | 0 refills | Status: DC

## 2019-08-29 MED ORDER — UMECLIDINIUM BROMIDE 62.5 MCG/INH IN AEPB: 1.0000 | INHALATION_SPRAY | Freq: Every day | RESPIRATORY_TRACT | 0 refills | Status: DC

## 2019-08-29 MED ORDER — BREO ELLIPTA 100-25 MCG/INH IN AEPB: 1.0000 | INHALATION_SPRAY | Freq: Every day | RESPIRATORY_TRACT | 0 refills | Status: DC

## 2019-08-29 MED ORDER — IPRATROPIUM-ALBUTEROL 0.5-2.5 (3) MG/3ML IN SOLN: 3.0000 mL | Freq: Four times a day (QID) | RESPIRATORY_TRACT | 0 refills | Status: DC | PRN

## 2019-08-29 MED ORDER — METOPROLOL SUCCINATE ER 25 MG PO TB24: 25.0000 mg | ORAL_TABLET | Freq: Every day | ORAL | 0 refills | Status: DC

## 2019-08-29 MED ORDER — VITAMIN D (ERGOCALCIFEROL) 1.25 MG (50000 UNIT) PO CAPS: 50000.0000 [IU] | ORAL_CAPSULE | ORAL | 0 refills | Status: DC

## 2019-08-29 MED ORDER — ACETAMINOPHEN 500 MG PO TABS: 1000.0000 mg | ORAL_TABLET | Freq: Three times a day (TID) | ORAL | 0 refills | Status: DC | PRN

## 2019-08-29 MED ORDER — TICAGRELOR 90 MG PO TABS: 90.0000 mg | ORAL_TABLET | Freq: Two times a day (BID) | ORAL | 0 refills | Status: DC

## 2019-08-29 MED ORDER — ALBUTEROL SULFATE HFA 108 (90 BASE) MCG/ACT IN AERS: 2.0000 | INHALATION_SPRAY | Freq: Four times a day (QID) | RESPIRATORY_TRACT | 0 refills | Status: DC | PRN

## 2019-08-29 MED ORDER — POLYETHYLENE GLYCOL 3350 17 G PO PACK: 17.0000 g | PACK | Freq: Every day | ORAL | 0 refills | Status: DC | PRN

## 2019-08-29 NOTE — Progress Notes (Signed)
Provider:  Rexene Edison. Mariea Clonts, D.O., C.M.D. Location:  [place] Room Number: [place] Point of Service:  SNF (31)  PCP: Beckie Salts, MD Patient Care Team: Beckie Salts, MD as PCP - General (Internal Medicine) Belva Crome, MD as PCP - Cardiology (Cardiology)  Extended Emergency Contact Information Primary Emergency Contact: [place] Mobile Phone: [phone] Relation: Aunt Secondary Emergency Contact: Edward Jolly Mobile Phone: [phone] Relation: Uncle  Code Status: full code Goals of Care: Advanced Directive information Advanced Directives 08/29/2019  Does Patient Have a Medical Advance Directive? Yes  Type of Advance Directive -  Does patient want to make changes to medical advance directive? No - Patient declined  Would patient like information on creating a medical advance directive? -   Chief Complaint  Patient presents with  . New Admit To SNF    New Admission to SNF     HPI: Patient is a 64 y.o. female seen today for admission to [facility] s/p hospitalization with acute respiratory failure from pneumonia, CHF, acute kidney injury.  She was hospitalized from 6/3-6/18/21 at [facility].  She has a h/o CAD s/p MI with PCI and chronic LAD blockage, COPD, NSCLC stage 3 s/p lung resection, smoking, CHF with EF 30-35%, and anal ca s/p colostomy.  She was found unresponsive satting at 50% and put on CPAP by EMS, then put on bipap at the ED with improvement.  Wound up being intubated and required 3 attempts to be weaned/extubated.  Left ICU 6/15.  She completed her augmentin 6/17.  She is here for short-term rehab and to f/u with Dr. Daneen Schick from cardiology.  PCP is Dr Beckie Salts.    When seen, she was eager to discharge home.  She feels much better and is functioning independently here per staff.  She is ambulating with her home O2.  Uses it for exertion, showers and sleep at baseline.    She is not going to take the  covid vaccines--says God is going to protect her and if she gets it that was meant to happen.    She had returned to being independent after her heart attacks and has someone to stay with her at home when she leaves here.  [facility] and has [facility] for her oxygen supplies.  She is not having any difficulty with her colostomy from her bowel resection for colon ca.  Past Medical History:  Diagnosis Date  . CAD (coronary artery disease) 10/20 DES to pRCA, DES mCx   . Cancer of lung ([place])    Twice  . Colon cancer ([place])   . COPD (chronic obstructive pulmonary disease) ([place])   . Hyperlipidemia   . Hypertension   . Ischemic cardiomyopathy   . Neuropathy    Secondary to chemo  . STEMI (ST elevation myocardial infarction) ([place]) 12/12/2018  . Tobacco abuse    Past Surgical History:  Procedure Laterality Date  . ABDOMINAL SURGERY     For colon cancer  . COLOSTOMY    . CORONARY STENT INTERVENTION N/A 12/20/2018   Procedure: CORONARY STENT INTERVENTION - Right;  Surgeon: Burnell Blanks, MD;  Location: [facility];  Service: Cardiovascular;  Laterality: N/A;  . CORONARY/GRAFT ACUTE MI REVASCULARIZATION N/A 12/12/2018   Procedure: Coronary/Graft Acute MI Revascularization;  Surgeon: Belva Crome, MD;  Location: [facility];  Service: Cardiovascular;  Laterality: N/A;  . LEFT  HEART CATH AND CORONARY ANGIOGRAPHY N/A 12/12/2018   Procedure: LEFT HEART CATH AND CORONARY ANGIOGRAPHY;  Surgeon: Belva Crome, MD;  Location: [facility];  Service: Cardiovascular;  Laterality: N/A;  . LEFT HEART CATH AND CORONARY ANGIOGRAPHY N/A 08/11/2019   Procedure: LEFT HEART CATH AND CORONARY ANGIOGRAPHY;  Surgeon: Burnell Blanks, MD;  Location: [facility];  Service: Cardiovascular;  Laterality: N/A;    Social History   Socioeconomic History  . Marital status: Divorced    Spouse name: Not on file  . Number of children:  Not on file  . Years of education: Not on file  . Highest education level: Not on file  Occupational History  . Not on file  Tobacco Use  . Smoking status: Current Every Day Smoker    Types: Cigarettes  . Smokeless tobacco: Never Used  Vaping Use  . Vaping Use: Never used  Substance and Sexual Activity  . Alcohol use: Never  . Drug use: Never  . Sexual activity: Not on file  Other Topics Concern  . Not on file  Social History Narrative   Patient lives in [place].   Social Determinants of Health   Financial Resource Strain:   . Difficulty of Paying Living Expenses:   Food Insecurity:   . Worried About Charity fundraiser in the Last Year:   . Arboriculturist in the Last Year:   Transportation Needs:   . Film/video editor (Medical):   Marland Kitchen Lack of Transportation (Non-Medical):   Physical Activity:   . Days of Exercise per Week:   . Minutes of Exercise per Session:   Stress:   . Feeling of Stress :   Social Connections:   . Frequency of Communication with Friends and Family:   . Frequency of Social Gatherings with Friends and Family:   . Attends Religious Services:   . Active Member of Clubs or Organizations:   . Attends Archivist Meetings:   Marland Kitchen Marital Status:     reports that she has been smoking cigarettes. She has never used smokeless tobacco. She reports that she does not drink alcohol and does not use drugs.  Functional Status Survey:    Family History  Problem Relation Age of Onset  . Hypertension Mother   . Hypertension Father     Health Maintenance  Topic Date Due  . Hepatitis C Screening  Never done  . COVID-19 Vaccine (1) Never done  . TETANUS/TDAP  Never done  . PAP SMEAR-Modifier  Never done  . MAMMOGRAM  Never done  . COLONOSCOPY  Never done  . INFLUENZA VACCINE  10/08/2019  . HIV Screening  Completed    Allergies  Allergen Reactions  . Statins Other (See Comments)  . Hydrochlorothiazide Other (See Comments)    headaches    . Hydrocodone Nausea And Vomiting  . Lisinopril Other (See Comments)    Headache   . Oxycodone Nausea Only    Outpatient Encounter Medications as of 08/29/2019  Medication Sig  . acetaminophen (TYLENOL) 500 MG tablet Take 1,000 mg by mouth every 4 (four) hours as needed for mild pain.   Marland Kitchen albuterol (VENTOLIN HFA) 108 (90 Base) MCG/ACT inhaler Inhale 2 puffs into the lungs every 4 (four) hours as needed for shortness of breath or wheezing.  Marland Kitchen aspirin 81 MG chewable tablet Chew 1 tablet (81 mg total) by mouth daily.  Marland Kitchen BREO ELLIPTA 100-25 MCG/INH AEPB Inhale 1 puff into the lungs  daily.  . buprenorphine (BUTRANS) 20 MCG/HR PTWK Place 1 patch onto the skin once a week for 7 days.  . Cyanocobalamin (B-12 PO) Take 1 tablet by mouth every other day.  . docusate sodium (COLACE) 100 MG capsule Take 1 capsule (100 mg total) by mouth 2 (two) times daily.  . feeding supplement, ENSURE ENLIVE, (ENSURE ENLIVE) LIQD Take 237 mLs by mouth 3 (three) times daily between meals.  . furosemide (LASIX) 20 MG tablet Take 1 tablet (20 mg total) by mouth daily.  Marland Kitchen guaiFENesin (MUCINEX) 600 MG 12 hr tablet Take 1 tablet (600 mg total) by mouth 2 (two) times daily.  . insulin aspart (NOVOLOG FLEXPEN) 100 UNIT/ML FlexPen Inject 3 Units into the skin 3 (three) times daily before meals. Only if cbg is greater than 150 x 8 more days while on prednisone  . ipratropium-albuterol (DUONEB) 0.5-2.5 (3) MG/3ML SOLN Inhale 3 mLs into the lungs every 4 (four) hours as needed for shortness of breath or wheezing.  Marland Kitchen losartan (COZAAR) 25 MG tablet Take 0.5 tablets (12.5 mg total) by mouth daily.  . melatonin 3 MG TABS tablet Take 1 tablet (3 mg total) by mouth at bedtime as needed (insomnia/anxiety).  . metoprolol succinate (TOPROL-XL) 25 MG 24 hr tablet Take 1 tablet (25 mg total) by mouth daily.  . Multiple Vitamin (MULTIVITAMIN WITH MINERALS) TABS tablet Take 1 tablet by mouth daily.  . nicotine (NICODERM CQ - DOSED IN MG/24  HOURS) 21 mg/24hr patch Place 1 patch (21 mg total) onto the skin daily.  . NON FORMULARY DIET:Regular NAS Heart Healthy CCD diet  . pantoprazole (PROTONIX) 40 MG tablet Take 1 tablet (40 mg total) by mouth daily.  . polyethylene glycol (MIRALAX / GLYCOLAX) 17 g packet Take 17 g by mouth daily as needed for moderate constipation.  . potassium chloride SA (KLOR-CON) 20 MEQ tablet Take 1 tablet (20 mEq total) by mouth daily.  . predniSONE (DELTASONE) 10 MG tablet Take 4 tabs daily for 2 days, then take 3 tabs daily for 2 days, then take 2 tabs daily for 2 days, then take 1 tab daily for 2 days, then STOP.  Marland Kitchen rosuvastatin (CRESTOR) 40 MG tablet Take 20 mg by mouth daily.  . sodium chloride (OCEAN) 0.65 % SOLN nasal spray Place 1 spray into both nostrils as needed for congestion.  . sucralfate (CARAFATE) 1 g tablet Take 1 g by mouth as needed. For stomach discomfort  . ticagrelor (BRILINTA) 90 MG TABS tablet Take 1 tablet (90 mg total) by mouth 2 (two) times daily.  Marland Kitchen umeclidinium bromide (INCRUSE ELLIPTA) 62.5 MCG/INH AEPB Inhale 1 puff into the lungs daily.  . Vitamin D, Ergocalciferol, (DRISDOL) 1.25 MG (50000 UT) CAPS capsule Take 50,000 Units by mouth every 7 (seven) days.   No facility-administered encounter medications on file as of 08/29/2019.    Review of Systems  Constitutional: Positive for malaise/fatigue. Negative for chills and fever.  HENT: Negative for congestion, hearing loss and sore throat.   Eyes: Negative for blurred vision.  Respiratory: Positive for cough and wheezing. Negative for sputum production and shortness of breath.        Not completely back to baseline, but much improved she reports  Cardiovascular: Negative for chest pain, palpitations and leg swelling.  Gastrointestinal: Negative for abdominal pain, blood in stool, constipation, diarrhea and melena.       Colostomy  Genitourinary: Negative for dysuria.  Musculoskeletal: Negative for falls and joint pain.    Skin:  Negative for itching and rash.  Neurological: Positive for tingling and sensory change. Negative for loss of consciousness.       Has neuropathy from her chemo and reports it seems like it was awakened  Endo/Heme/Allergies: Bruises/bleeds easily.  Psychiatric/Behavioral: Negative for depression and memory loss. The patient is not nervous/anxious and does not have insomnia.     Vitals:   08/29/19 0821  BP: (!) 98/53  Pulse: 72  Temp: 97.7 F (36.5 C)  Weight: 174 lb 9.6 oz (79.2 kg)  Height: 5\' 9"  (1.753 m)   Body mass index is 25.78 kg/m. Physical Exam Vitals reviewed.  Constitutional:      General: She is not in acute distress.    Appearance: Normal appearance. She is not toxic-appearing.  HENT:     Head: Normocephalic and atraumatic.     Right Ear: External ear normal.     Left Ear: External ear normal.     Nose: Nose normal.     Mouth/Throat:     Pharynx: Oropharynx is clear.  Eyes:     Extraocular Movements: Extraocular movements intact.     Pupils: Pupils are equal, round, and reactive to light.  Cardiovascular:     Rate and Rhythm: Normal rate and regular rhythm.     Pulses: Normal pulses.     Heart sounds: Normal heart sounds.  Pulmonary:     Effort: Pulmonary effort is normal. No respiratory distress.     Breath sounds: Wheezing and rhonchi present. No rales.     Comments: Wearing 2L O2 via [place]; right lung with more coarse breath sounds vs left Abdominal:     General: Bowel sounds are normal. There is no distension.     Palpations: Abdomen is soft. There is no mass.     Tenderness: There is no abdominal tenderness. There is no guarding or rebound.     Comments: Colostomy pink with soft brown stool  Musculoskeletal:        General: Normal range of motion.     Cervical back: Neck supple.     Right lower leg: No edema.     Left lower leg: No edema.  Skin:    General: Skin is warm and dry.  Neurological:     General: No focal deficit present.      Mental Status: She is alert and oriented to person, place, and time.     Cranial Nerves: No cranial nerve deficit.     Motor: No weakness.     Gait: Gait normal.  Psychiatric:        Mood and Affect: Mood normal.        Behavior: Behavior normal.        Thought Content: Thought content normal.        Judgment: Judgment normal.     Labs reviewed: Basic Metabolic Panel: Recent Labs    08/13/19 0225 08/13/19 0502 08/16/19 0222 08/16/19 1205 08/18/19 0241 08/19/19 0203 08/22/19 5397 08/22/19 0357 08/23/19 0335 08/24/19 0308 08/25/19 0308  NA 142   < > 146*   < > 144   < > 132*   < > 130* 131* 129*  K 3.8   < > 4.1   < > 3.8   < > 4.7   < > 3.7 4.3 3.9  CL 107   < > 110  --  112*   < > 96*   < > 94* 94* 92*  CO2 21*   < > 23  --  24   < > 26   < > 28 28 28   GLUCOSE 165*   < > 140*  --  199*   < > 127*   < > 106* 121* 108*  BUN 33*   < > 52*  --  54*   < > 47*   < > 41* 38* 34*  CREATININE 1.06*   < > 1.18*  --  1.10*   < > 0.99   < > 1.08* 1.21* 1.15*  CALCIUM 9.4   < > 9.3  --  8.9   < > 9.7   < > 9.2 9.0 9.0  MG 2.3   < > 2.7*  --  2.7*  --  2.6*  --   --   --   --   PHOS 3.5  --  1.6*  --  3.2  --   --   --   --   --   --    < > = values in this interval not displayed.   Liver Function Tests: Recent Labs    08/12/19 0727 08/20/19 0343 08/22/19 0357  AST 47* 19 21  ALT 28 24 33  ALKPHOS 96 49 52  BILITOT 0.5 0.9 0.7  PROT 7.2 6.3* 6.2*  ALBUMIN 3.4* 3.3* 3.3*   No results for input(s): LIPASE, AMYLASE in the last 8760 hours. No results for input(s): AMMONIA in the last 8760 hours. CBC: Recent Labs    08/23/19 0335 08/24/19 0308 08/25/19 0308  WBC 20.8* 19.4* 21.7*  NEUTROABS 15.7* 16.8* 17.6*  HGB 13.7 13.2 13.4  HCT 44.3 42.0 42.6  MCV 83.7 82.8 82.1  PLT 253 219 263   Cardiac Enzymes: No results for input(s): CKTOTAL, CKMB, CKMBINDEX, TROPONINI in the last 8760 hours. BNP: Invalid input(s): POCBNP Lab Results  Component Value Date   HGBA1C 5.8  (H) 08/11/2019   No results found for: TSH No results found for: VITAMINB12 No results found for: FOLATE Lab Results  Component Value Date   FERRITIN 67 08/20/2019    Imaging and Procedures obtained prior to SNF admission: CT Angio Chest PE W/Cm &/Or Wo Cm  Result Date: 08/11/2019 CLINICAL DATA:  Shortness of breath EXAM: CT ANGIOGRAPHY CHEST WITH CONTRAST TECHNIQUE: Multidetector CT imaging of the chest was performed using the standard protocol during bolus administration of intravenous contrast. Multiplanar CT image reconstructions and MIPs were obtained to evaluate the vascular anatomy. CONTRAST:  4mL OMNIPAQUE IOHEXOL 350 MG/ML SOLN COMPARISON:  Radiograph same day, CT May 09, 2019 FINDINGS: Cardiovascular: There is a optimal opacification of the pulmonary arteries. There is no central,segmental, or subsegmental filling defects within the pulmonary arteries. The heart is normal in size. No pericardial effusion or thickening. No evidence right heart strain. There is normal three-vessel brachiocephalic anatomy without proximal stenosis. Aortic atherosclerosis is noted. There is calcifications seen at the mitral valve and coronary artery calcifications. Mediastinum/Nodes: No hilar, mediastinal, or axillary adenopathy. Thyroid gland, trachea, and esophagus demonstrate no significant findings. Lungs/Pleura: Again noted is right medial suprahilar scarring and bronchiectasis with surgical sutures. There is no significant change in the ill-defined nodular opacity in the posterior left lower lobe measuring 1 cm, series 6, image 61. There is also a small 8 mm ill-defined opacity seen within the right middle lobe. There is mild hazy ill-defined ground-glass opacity with tree-in-bud opacities seen throughout both lungs. No pleural effusion. Upper Abdomen: No acute abnormalities present in the visualized portions of the upper abdomen. Musculoskeletal: No chest wall  abnormality. No acute or significant osseous  findings. Review of the MIP images confirms the above findings. IMPRESSION: No central, segmental, or subsegmental pulmonary embolism. Mild ground-glass tree-in-bud opacities seen throughout both lungs which may be due to infectious or inflammatory process. Stable ill-defined pulmonary nodules in the posterior left lower lobe and right middle lobe as described above. Aortic Atherosclerosis (ICD10-I70.0). Electronically Signed   By: Prudencio Pair M.D.   On: 08/11/2019 04:10   CARDIAC CATHETERIZATION  Result Date: 08/11/2019  Previously placed Prox RCA stent (unknown type) is widely patent.  Previously placed Prox Cx to Mid Cx stent (unknown type) is widely patent.  Mid Cx to Dist Cx lesion is 60% stenosed.  Prox LAD lesion is 100% stenosed.  Patent proximal RCA stent Patent Circumflex stent. Unchanged moderate mid Circumflex stenosis Chronic occlusion proximal LAD. The distal LAD and branches fill from right to left collaterals. Recommendations: Elevated troponin likely due to demand ischemia in setting of known LAD chronic occlusion. Her presentation is most c/w COPD exacerbation. Continue medical management of CAD.   DG Chest Port 1 View  Result Date: 08/10/2019 CLINICAL DATA:  Shortness of breath, right upper lobe cancer status post lobectomy with mediastinal recurrence EXAM: PORTABLE CHEST 1 VIEW COMPARISON:  07/06/2019, 05/09/2019 FINDINGS: Single frontal view of the chest demonstrates postsurgical changes from right upper lobectomy. Increased soft tissue density with spiculated margins at the right hilum, worrisome for progression of known mediastinal recurrence. Progressive post radiation changes fell also be considered. No new airspace disease, effusion, or pneumothorax. No acute bony abnormalities. IMPRESSION: 1. Increased density at the right hilum, with increasing spiculated margins. Differential includes progressive post radiation change versus Recurrent disease. Electronically Signed   By:  Randa Ngo M.D.   On: 08/10/2019 22:07    Assessment/Plan 1. Acute on chronic respiratory failure with hypercapnia (HCC) -oxygen use sounds like it is back to baseline--reports using oxygen for sleep, exertion at home before  2. Community acquired pneumonia of left upper lobe of lung -clinically improved and finished with abx previously, finishing off her prednisone taper, still has coarse breath sounds but actually more on the right side where she had the lung cancer removed and had radiation -using incentive spirometry -is eager to go home due to improved strength already--plans to exercise walking up and down her long hallway  3. Ventricular tachycardia ([place]) -historically noted   4. Chronic combined systolic and diastolic heart failure (HCC) -EF in 30s -euvolemic at present, no rales or jvd  5. COPD with acute exacerbation ([place]) -completing prednisone taper, completed abx for pneumonia, cont home inhaler and neb regimen and O2   6. AKI (acute kidney injury) ([place]) -resolved with hydration and resolution of infection  7. Tobacco abuse -she reports she's been without cigarettes since her admission and did have nicoderm patches at hospital but has been weaned now and plans to stay off cigarettes when she returns home -friend who is staying with her does not smoke and she cannot smoke in her apt anyway (also discussed risks with oxygen)  8. History of lung cancer in adulthood -s/p partial lung resection and XRT  9. History of anal cancer -s/p resection, chemo  Family/ staff Communication: discussed with rehab nurse  Labs/tests ordered:  Will need f/u cbc and bmp at PCP  Also discharged today.  She is going home with home health PT, OT, RN and all Rxs sent except butrans patch to [facility].  She is calling [facility] to get  the patches ordered b/c she has a contract with them.  She gets her oxygen through [facility] already and has both portable and concentrator.     Talley Kreiser L. Micholas Drumwright, D.O. [facility] [address], [place] [address] Cell Phone (Mon-Fri 8am-5pm):  [phone] On Call:  [phone] & follow prompts after 5pm & weekends Office Phone:  [phone] Office Fax:  [phone]

## 2019-09-05 ENCOUNTER — Inpatient Hospital Stay (HOSPITAL_COMMUNITY)
Admission: EM | Admit: 2019-09-05 | Discharge: 2019-10-04 | DRG: 004 | Payer: Medicare HMO | Attending: Internal Medicine | Admitting: Internal Medicine

## 2019-09-05 ENCOUNTER — Emergency Department (HOSPITAL_COMMUNITY): Payer: Medicare HMO

## 2019-09-05 DIAGNOSIS — L89229 Pressure ulcer of left hip, unspecified stage: Secondary | ICD-10-CM | POA: Diagnosis present

## 2019-09-05 DIAGNOSIS — J9611 Chronic respiratory failure with hypoxia: Secondary | ICD-10-CM | POA: Diagnosis not present

## 2019-09-05 DIAGNOSIS — Z8701 Personal history of pneumonia (recurrent): Secondary | ICD-10-CM

## 2019-09-05 DIAGNOSIS — Z85038 Personal history of other malignant neoplasm of large intestine: Secondary | ICD-10-CM

## 2019-09-05 DIAGNOSIS — F1120 Opioid dependence, uncomplicated: Secondary | ICD-10-CM | POA: Diagnosis present

## 2019-09-05 DIAGNOSIS — L89303 Pressure ulcer of unspecified buttock, stage 3: Secondary | ICD-10-CM | POA: Diagnosis not present

## 2019-09-05 DIAGNOSIS — Z85048 Personal history of other malignant neoplasm of rectum, rectosigmoid junction, and anus: Secondary | ICD-10-CM | POA: Diagnosis present

## 2019-09-05 DIAGNOSIS — B373 Candidiasis of vulva and vagina: Secondary | ICD-10-CM | POA: Diagnosis present

## 2019-09-05 DIAGNOSIS — E871 Hypo-osmolality and hyponatremia: Secondary | ICD-10-CM | POA: Diagnosis not present

## 2019-09-05 DIAGNOSIS — Z8249 Family history of ischemic heart disease and other diseases of the circulatory system: Secondary | ICD-10-CM

## 2019-09-05 DIAGNOSIS — E876 Hypokalemia: Secondary | ICD-10-CM | POA: Diagnosis not present

## 2019-09-05 DIAGNOSIS — C3490 Malignant neoplasm of unspecified part of unspecified bronchus or lung: Secondary | ICD-10-CM | POA: Diagnosis present

## 2019-09-05 DIAGNOSIS — E46 Unspecified protein-calorie malnutrition: Secondary | ICD-10-CM | POA: Diagnosis not present

## 2019-09-05 DIAGNOSIS — K219 Gastro-esophageal reflux disease without esophagitis: Secondary | ICD-10-CM | POA: Diagnosis present

## 2019-09-05 DIAGNOSIS — J189 Pneumonia, unspecified organism: Secondary | ICD-10-CM | POA: Diagnosis not present

## 2019-09-05 DIAGNOSIS — I959 Hypotension, unspecified: Secondary | ICD-10-CM | POA: Diagnosis present

## 2019-09-05 DIAGNOSIS — J441 Chronic obstructive pulmonary disease with (acute) exacerbation: Secondary | ICD-10-CM | POA: Diagnosis present

## 2019-09-05 DIAGNOSIS — F17210 Nicotine dependence, cigarettes, uncomplicated: Secondary | ICD-10-CM | POA: Diagnosis present

## 2019-09-05 DIAGNOSIS — J9602 Acute respiratory failure with hypercapnia: Secondary | ICD-10-CM | POA: Diagnosis not present

## 2019-09-05 DIAGNOSIS — J9690 Respiratory failure, unspecified, unspecified whether with hypoxia or hypercapnia: Secondary | ICD-10-CM

## 2019-09-05 DIAGNOSIS — J440 Chronic obstructive pulmonary disease with acute lower respiratory infection: Secondary | ICD-10-CM | POA: Diagnosis present

## 2019-09-05 DIAGNOSIS — Y95 Nosocomial condition: Secondary | ICD-10-CM | POA: Diagnosis present

## 2019-09-05 DIAGNOSIS — R0603 Acute respiratory distress: Secondary | ICD-10-CM

## 2019-09-05 DIAGNOSIS — J9622 Acute and chronic respiratory failure with hypercapnia: Secondary | ICD-10-CM | POA: Diagnosis present

## 2019-09-05 DIAGNOSIS — E669 Obesity, unspecified: Secondary | ICD-10-CM | POA: Diagnosis present

## 2019-09-05 DIAGNOSIS — Z85528 Personal history of other malignant neoplasm of kidney: Secondary | ICD-10-CM

## 2019-09-05 DIAGNOSIS — T380X5A Adverse effect of glucocorticoids and synthetic analogues, initial encounter: Secondary | ICD-10-CM | POA: Diagnosis present

## 2019-09-05 DIAGNOSIS — Z9981 Dependence on supplemental oxygen: Secondary | ICD-10-CM | POA: Diagnosis not present

## 2019-09-05 DIAGNOSIS — I110 Hypertensive heart disease with heart failure: Secondary | ICD-10-CM | POA: Diagnosis present

## 2019-09-05 DIAGNOSIS — I2510 Atherosclerotic heart disease of native coronary artery without angina pectoris: Secondary | ICD-10-CM | POA: Diagnosis present

## 2019-09-05 DIAGNOSIS — Z8616 Personal history of COVID-19: Secondary | ICD-10-CM | POA: Diagnosis not present

## 2019-09-05 DIAGNOSIS — J9601 Acute respiratory failure with hypoxia: Secondary | ICD-10-CM

## 2019-09-05 DIAGNOSIS — E872 Acidosis: Secondary | ICD-10-CM | POA: Diagnosis present

## 2019-09-05 DIAGNOSIS — F419 Anxiety disorder, unspecified: Secondary | ICD-10-CM | POA: Diagnosis present

## 2019-09-05 DIAGNOSIS — Z930 Tracheostomy status: Secondary | ICD-10-CM

## 2019-09-05 DIAGNOSIS — J9620 Acute and chronic respiratory failure, unspecified whether with hypoxia or hypercapnia: Secondary | ICD-10-CM | POA: Diagnosis not present

## 2019-09-05 DIAGNOSIS — Z955 Presence of coronary angioplasty implant and graft: Secondary | ICD-10-CM

## 2019-09-05 DIAGNOSIS — J9600 Acute respiratory failure, unspecified whether with hypoxia or hypercapnia: Secondary | ICD-10-CM

## 2019-09-05 DIAGNOSIS — Z85118 Personal history of other malignant neoplasm of bronchus and lung: Secondary | ICD-10-CM

## 2019-09-05 DIAGNOSIS — Z9911 Dependence on respirator [ventilator] status: Secondary | ICD-10-CM | POA: Diagnosis not present

## 2019-09-05 DIAGNOSIS — G9341 Metabolic encephalopathy: Secondary | ICD-10-CM | POA: Diagnosis present

## 2019-09-05 DIAGNOSIS — G894 Chronic pain syndrome: Secondary | ICD-10-CM | POA: Diagnosis present

## 2019-09-05 DIAGNOSIS — C210 Malignant neoplasm of anus, unspecified: Secondary | ICD-10-CM | POA: Diagnosis present

## 2019-09-05 DIAGNOSIS — I5022 Chronic systolic (congestive) heart failure: Secondary | ICD-10-CM | POA: Diagnosis not present

## 2019-09-05 DIAGNOSIS — L89153 Pressure ulcer of sacral region, stage 3: Secondary | ICD-10-CM | POA: Diagnosis not present

## 2019-09-05 DIAGNOSIS — E1165 Type 2 diabetes mellitus with hyperglycemia: Secondary | ICD-10-CM | POA: Diagnosis present

## 2019-09-05 DIAGNOSIS — Z888 Allergy status to other drugs, medicaments and biological substances status: Secondary | ICD-10-CM

## 2019-09-05 DIAGNOSIS — Z515 Encounter for palliative care: Secondary | ICD-10-CM | POA: Diagnosis not present

## 2019-09-05 DIAGNOSIS — D509 Iron deficiency anemia, unspecified: Secondary | ICD-10-CM | POA: Diagnosis present

## 2019-09-05 DIAGNOSIS — Z978 Presence of other specified devices: Secondary | ICD-10-CM | POA: Diagnosis not present

## 2019-09-05 DIAGNOSIS — R0602 Shortness of breath: Secondary | ICD-10-CM

## 2019-09-05 DIAGNOSIS — I509 Heart failure, unspecified: Secondary | ICD-10-CM | POA: Diagnosis not present

## 2019-09-05 DIAGNOSIS — B9620 Unspecified Escherichia coli [E. coli] as the cause of diseases classified elsewhere: Secondary | ICD-10-CM | POA: Diagnosis not present

## 2019-09-05 DIAGNOSIS — Z7902 Long term (current) use of antithrombotics/antiplatelets: Secondary | ICD-10-CM

## 2019-09-05 DIAGNOSIS — E785 Hyperlipidemia, unspecified: Secondary | ICD-10-CM | POA: Diagnosis not present

## 2019-09-05 DIAGNOSIS — Z7982 Long term (current) use of aspirin: Secondary | ICD-10-CM

## 2019-09-05 DIAGNOSIS — L8990 Pressure ulcer of unspecified site, unspecified stage: Secondary | ICD-10-CM | POA: Insufficient documentation

## 2019-09-05 DIAGNOSIS — Z751 Person awaiting admission to adequate facility elsewhere: Secondary | ICD-10-CM

## 2019-09-05 DIAGNOSIS — R918 Other nonspecific abnormal finding of lung field: Secondary | ICD-10-CM | POA: Diagnosis present

## 2019-09-05 DIAGNOSIS — L89156 Pressure-induced deep tissue damage of sacral region: Secondary | ICD-10-CM | POA: Diagnosis present

## 2019-09-05 DIAGNOSIS — I255 Ischemic cardiomyopathy: Secondary | ICD-10-CM | POA: Diagnosis present

## 2019-09-05 DIAGNOSIS — J150 Pneumonia due to Klebsiella pneumoniae: Secondary | ICD-10-CM | POA: Diagnosis present

## 2019-09-05 DIAGNOSIS — Z79899 Other long term (current) drug therapy: Secondary | ICD-10-CM

## 2019-09-05 DIAGNOSIS — I5023 Acute on chronic systolic (congestive) heart failure: Secondary | ICD-10-CM | POA: Diagnosis present

## 2019-09-05 DIAGNOSIS — I10 Essential (primary) hypertension: Secondary | ICD-10-CM

## 2019-09-05 DIAGNOSIS — J9501 Hemorrhage from tracheostomy stoma: Secondary | ICD-10-CM | POA: Diagnosis not present

## 2019-09-05 DIAGNOSIS — J9621 Acute and chronic respiratory failure with hypoxia: Secondary | ICD-10-CM | POA: Diagnosis present

## 2019-09-05 DIAGNOSIS — Z885 Allergy status to narcotic agent status: Secondary | ICD-10-CM

## 2019-09-05 DIAGNOSIS — Z9049 Acquired absence of other specified parts of digestive tract: Secondary | ICD-10-CM

## 2019-09-05 DIAGNOSIS — Z933 Colostomy status: Secondary | ICD-10-CM

## 2019-09-05 DIAGNOSIS — I5041 Acute combined systolic (congestive) and diastolic (congestive) heart failure: Secondary | ICD-10-CM | POA: Diagnosis not present

## 2019-09-05 DIAGNOSIS — G629 Polyneuropathy, unspecified: Secondary | ICD-10-CM | POA: Diagnosis present

## 2019-09-05 DIAGNOSIS — J449 Chronic obstructive pulmonary disease, unspecified: Secondary | ICD-10-CM | POA: Diagnosis not present

## 2019-09-05 DIAGNOSIS — G893 Neoplasm related pain (acute) (chronic): Secondary | ICD-10-CM | POA: Diagnosis present

## 2019-09-05 DIAGNOSIS — R069 Unspecified abnormalities of breathing: Secondary | ICD-10-CM

## 2019-09-05 DIAGNOSIS — T451X5A Adverse effect of antineoplastic and immunosuppressive drugs, initial encounter: Secondary | ICD-10-CM | POA: Diagnosis present

## 2019-09-05 DIAGNOSIS — I610 Nontraumatic intracerebral hemorrhage in hemisphere, subcortical: Secondary | ICD-10-CM | POA: Diagnosis not present

## 2019-09-05 DIAGNOSIS — I252 Old myocardial infarction: Secondary | ICD-10-CM

## 2019-09-05 LAB — I-STAT ARTERIAL BLOOD GAS, ED
Acid-Base Excess: 3 mmol/L — ABNORMAL HIGH (ref 0.0–2.0)
Bicarbonate: 32 mmol/L — ABNORMAL HIGH (ref 20.0–28.0)
Calcium, Ion: 1.22 mmol/L (ref 1.15–1.40)
HCT: 29 % — ABNORMAL LOW (ref 36.0–46.0)
Hemoglobin: 9.9 g/dL — ABNORMAL LOW (ref 12.0–15.0)
O2 Saturation: 100 %
Patient temperature: 98.1
Potassium: 3.5 mmol/L (ref 3.5–5.1)
Sodium: 142 mmol/L (ref 135–145)
TCO2: 34 mmol/L — ABNORMAL HIGH (ref 22–32)
pCO2 arterial: 78.7 mmHg (ref 32.0–48.0)
pH, Arterial: 7.216 — ABNORMAL LOW (ref 7.350–7.450)
pO2, Arterial: 415 mmHg — ABNORMAL HIGH (ref 83.0–108.0)

## 2019-09-05 LAB — URINALYSIS, ROUTINE W REFLEX MICROSCOPIC
Bilirubin Urine: NEGATIVE
Glucose, UA: NEGATIVE mg/dL
Ketones, ur: NEGATIVE mg/dL
Leukocytes,Ua: NEGATIVE
Nitrite: NEGATIVE
Protein, ur: NEGATIVE mg/dL
Specific Gravity, Urine: 1.008 (ref 1.005–1.030)
pH: 5 (ref 5.0–8.0)

## 2019-09-05 LAB — CBC
HCT: 32.8 % — ABNORMAL LOW (ref 36.0–46.0)
Hemoglobin: 9.2 g/dL — ABNORMAL LOW (ref 12.0–15.0)
MCH: 25.7 pg — ABNORMAL LOW (ref 26.0–34.0)
MCHC: 28 g/dL — ABNORMAL LOW (ref 30.0–36.0)
MCV: 91.6 fL (ref 80.0–100.0)
Platelets: 155 10*3/uL (ref 150–400)
RBC: 3.58 MIL/uL — ABNORMAL LOW (ref 3.87–5.11)
RDW: 17.2 % — ABNORMAL HIGH (ref 11.5–15.5)
WBC: 6.5 10*3/uL (ref 4.0–10.5)
nRBC: 0 % (ref 0.0–0.2)

## 2019-09-05 LAB — COMPREHENSIVE METABOLIC PANEL
ALT: 24 U/L (ref 0–44)
AST: 17 U/L (ref 15–41)
Albumin: 2.5 g/dL — ABNORMAL LOW (ref 3.5–5.0)
Alkaline Phosphatase: 81 U/L (ref 38–126)
Anion gap: 16 — ABNORMAL HIGH (ref 5–15)
BUN: 15 mg/dL (ref 8–23)
CO2: 21 mmol/L — ABNORMAL LOW (ref 22–32)
Calcium: 9 mg/dL (ref 8.9–10.3)
Chloride: 102 mmol/L (ref 98–111)
Creatinine, Ser: 1.09 mg/dL — ABNORMAL HIGH (ref 0.44–1.00)
GFR calc Af Amer: >60 mL/min (ref >60)
GFR calc non Af Amer: 54 mL/min — ABNORMAL LOW (ref >60)
Glucose, Bld: 320 mg/dL — ABNORMAL HIGH (ref 70–99)
Potassium: 3.9 mmol/L (ref 3.5–5.1)
Sodium: 139 mmol/L (ref 135–145)
Total Bilirubin: 0.6 mg/dL (ref 0.3–1.2)
Total Protein: 6.2 g/dL — ABNORMAL LOW (ref 6.5–8.1)

## 2019-09-05 LAB — CBC WITH DIFFERENTIAL/PLATELET
Abs Immature Granulocytes: 0 10*3/uL (ref 0.00–0.07)
Basophils Absolute: 0 10*3/uL (ref 0.0–0.1)
Basophils Relative: 0 %
Eosinophils Absolute: 0.1 10*3/uL (ref 0.0–0.5)
Eosinophils Relative: 1 %
HCT: 37.2 % (ref 36.0–46.0)
Hemoglobin: 10.3 g/dL — ABNORMAL LOW (ref 12.0–15.0)
Lymphocytes Relative: 33 %
Lymphs Abs: 3.5 10*3/uL (ref 0.7–4.0)
MCH: 25.9 pg — ABNORMAL LOW (ref 26.0–34.0)
MCHC: 27.7 g/dL — ABNORMAL LOW (ref 30.0–36.0)
MCV: 93.7 fL (ref 80.0–100.0)
Monocytes Absolute: 0.4 10*3/uL (ref 0.1–1.0)
Monocytes Relative: 4 %
Neutro Abs: 6.6 10*3/uL (ref 1.7–7.7)
Neutrophils Relative %: 62 %
Platelets: 246 10*3/uL (ref 150–400)
RBC: 3.97 MIL/uL (ref 3.87–5.11)
RDW: 17.2 % — ABNORMAL HIGH (ref 11.5–15.5)
WBC: 10.7 10*3/uL — ABNORMAL HIGH (ref 4.0–10.5)
nRBC: 0 /100 WBC
nRBC: 0.4 % — ABNORMAL HIGH (ref 0.0–0.2)

## 2019-09-05 LAB — MRSA PCR SCREENING: MRSA by PCR: NEGATIVE

## 2019-09-05 LAB — TROPONIN I (HIGH SENSITIVITY)
Troponin I (High Sensitivity): 272 ng/L (ref <18)
Troponin I (High Sensitivity): 319 ng/L (ref <18)

## 2019-09-05 LAB — GLUCOSE, CAPILLARY
Glucose-Capillary: 105 mg/dL — ABNORMAL HIGH (ref 70–99)
Glucose-Capillary: 105 mg/dL — ABNORMAL HIGH (ref 70–99)
Glucose-Capillary: 108 mg/dL — ABNORMAL HIGH (ref 70–99)
Glucose-Capillary: 123 mg/dL — ABNORMAL HIGH (ref 70–99)

## 2019-09-05 LAB — PROCALCITONIN: Procalcitonin: 0.42 ng/mL

## 2019-09-05 LAB — SARS CORONAVIRUS 2 BY RT PCR (HOSPITAL ORDER, PERFORMED IN ~~LOC~~ HOSPITAL LAB): SARS Coronavirus 2: NEGATIVE

## 2019-09-05 LAB — LACTIC ACID, PLASMA
Lactic Acid, Venous: 1.6 mmol/L (ref 0.5–1.9)
Lactic Acid, Venous: 1.9 mmol/L (ref 0.5–1.9)

## 2019-09-05 LAB — BRAIN NATRIURETIC PEPTIDE: B Natriuretic Peptide: 2477.5 pg/mL — ABNORMAL HIGH (ref 0.0–100.0)

## 2019-09-05 LAB — PHOSPHORUS
Phosphorus: 4.5 mg/dL (ref 2.5–4.6)
Phosphorus: 4 mg/dL (ref 2.5–4.6)

## 2019-09-05 LAB — MAGNESIUM
Magnesium: 1.8 mg/dL (ref 1.7–2.4)
Magnesium: 1.7 mg/dL (ref 1.7–2.4)

## 2019-09-05 MED ORDER — FENTANYL BOLUS VIA INFUSION
75.0000 ug | INTRAVENOUS | Status: DC | PRN
  Administered 2019-09-06 – 2019-09-08 (×14): 75 ug via INTRAVENOUS
  Filled 2019-09-05: qty 75

## 2019-09-05 MED ORDER — MIDAZOLAM HCL 2 MG/2ML IJ SOLN
2.0000 mg | Freq: Once | INTRAMUSCULAR | Status: AC
  Administered 2019-09-05: 2 mg via INTRAVENOUS
  Filled 2019-09-05: qty 2

## 2019-09-05 MED ORDER — VANCOMYCIN HCL IN DEXTROSE 1-5 GM/200ML-% IV SOLN
1000.0000 mg | Freq: Two times a day (BID) | INTRAVENOUS | Status: DC
  Administered 2019-09-05: 1000 mg via INTRAVENOUS
  Filled 2019-09-05: qty 200

## 2019-09-05 MED ORDER — DOCUSATE SODIUM 100 MG PO CAPS: 100.0000 mg | ORAL_CAPSULE | Freq: Two times a day (BID) | ORAL | Status: DC

## 2019-09-05 MED ORDER — METHYLPREDNISOLONE SODIUM SUCC 125 MG IJ SOLR: 60.0000 mg | Freq: Two times a day (BID) | INTRAMUSCULAR | Status: DC

## 2019-09-05 MED ORDER — LIDOCAINE VISCOUS HCL 2 % MT SOLN
15.0000 mL | OROMUCOSAL | Status: DC | PRN
  Administered 2019-09-05 – 2019-09-27 (×3): 15 mL via OROMUCOSAL
  Filled 2019-09-05 (×3): qty 15

## 2019-09-05 MED ORDER — SODIUM CHLORIDE 0.9 % IV SOLN
INTRAVENOUS | Status: AC | PRN
  Administered 2019-09-05: 1000 mL via INTRAVENOUS

## 2019-09-05 MED ORDER — B-12 1000 MCG PO CAPS: 1.0000 | ORAL_CAPSULE | ORAL | Status: DC

## 2019-09-05 MED ORDER — TICAGRELOR 90 MG PO TABS: 90.0000 mg | ORAL_TABLET | Freq: Two times a day (BID) | ORAL | Status: DC

## 2019-09-05 MED ORDER — IPRATROPIUM-ALBUTEROL 0.5-2.5 (3) MG/3ML IN SOLN
6.0000 mL | Freq: Once | RESPIRATORY_TRACT | Status: AC
  Administered 2019-09-05: 6 mL via RESPIRATORY_TRACT
  Filled 2019-09-05: qty 6

## 2019-09-05 MED ORDER — CYANOCOBALAMIN 500 MCG PO TABS
1000.0000 ug | ORAL_TABLET | ORAL | Status: DC
  Administered 2019-09-05 – 2019-10-03 (×15): 1000 ug
  Filled 2019-09-05 (×15): qty 2

## 2019-09-05 MED ORDER — SUCCINYLCHOLINE CHLORIDE 20 MG/ML IJ SOLN
INTRAMUSCULAR | Status: AC | PRN
  Administered 2019-09-05: 150 mg via INTRAVENOUS

## 2019-09-05 MED ORDER — CHLORHEXIDINE GLUCONATE 0.12% ORAL RINSE (MEDLINE KIT)
15.0000 mL | Freq: Two times a day (BID) | OROMUCOSAL | Status: DC
  Administered 2019-09-05 – 2019-10-03 (×38): 15 mL via OROMUCOSAL

## 2019-09-05 MED ORDER — PROPOFOL 1000 MG/100ML IV EMUL: 5.0000 ug/kg/min | INTRAVENOUS | Status: DC

## 2019-09-05 MED ORDER — FENTANYL CITRATE (PF) 100 MCG/2ML IJ SOLN: 50.0000 ug | Freq: Once | INTRAMUSCULAR | Status: AC

## 2019-09-05 MED ORDER — INSULIN ASPART 100 UNIT/ML ~~LOC~~ SOLN
0.0000 [IU] | SUBCUTANEOUS | Status: DC
  Administered 2019-09-05 – 2019-09-07 (×4): 2 [IU] via SUBCUTANEOUS
  Administered 2019-09-07: 3 [IU] via SUBCUTANEOUS
  Administered 2019-09-08: 2 [IU] via SUBCUTANEOUS
  Administered 2019-09-08 (×2): 3 [IU] via SUBCUTANEOUS
  Administered 2019-09-08: 2 [IU] via SUBCUTANEOUS
  Administered 2019-09-09: 3 [IU] via SUBCUTANEOUS
  Administered 2019-09-09: 2 [IU] via SUBCUTANEOUS
  Administered 2019-09-09: 3 [IU] via SUBCUTANEOUS
  Administered 2019-09-09 – 2019-09-10 (×5): 2 [IU] via SUBCUTANEOUS
  Administered 2019-09-10 – 2019-09-11 (×3): 3 [IU] via SUBCUTANEOUS
  Administered 2019-09-11 – 2019-09-12 (×10): 2 [IU] via SUBCUTANEOUS
  Administered 2019-09-13 (×3): 3 [IU] via SUBCUTANEOUS
  Administered 2019-09-13 (×2): 2 [IU] via SUBCUTANEOUS
  Administered 2019-09-13: 3 [IU] via SUBCUTANEOUS
  Administered 2019-09-13 – 2019-09-14 (×2): 2 [IU] via SUBCUTANEOUS
  Administered 2019-09-14: 3 [IU] via SUBCUTANEOUS
  Administered 2019-09-14 (×4): 2 [IU] via SUBCUTANEOUS
  Administered 2019-09-15 (×2): 3 [IU] via SUBCUTANEOUS
  Administered 2019-09-16 – 2019-09-18 (×10): 2 [IU] via SUBCUTANEOUS
  Administered 2019-09-19: 3 [IU] via SUBCUTANEOUS
  Administered 2019-09-19: 2 [IU] via SUBCUTANEOUS
  Administered 2019-09-19: 3 [IU] via SUBCUTANEOUS
  Administered 2019-09-19 – 2019-09-22 (×10): 2 [IU] via SUBCUTANEOUS
  Administered 2019-09-22: 5 [IU] via SUBCUTANEOUS
  Administered 2019-09-22 (×2): 2 [IU] via SUBCUTANEOUS
  Administered 2019-09-22: 3 [IU] via SUBCUTANEOUS
  Administered 2019-09-23: 5 [IU] via SUBCUTANEOUS
  Administered 2019-09-23 (×2): 3 [IU] via SUBCUTANEOUS
  Administered 2019-09-23 – 2019-09-24 (×6): 2 [IU] via SUBCUTANEOUS
  Administered 2019-09-24: 3 [IU] via SUBCUTANEOUS
  Administered 2019-09-25 – 2019-09-26 (×4): 2 [IU] via SUBCUTANEOUS
  Administered 2019-09-26: 8 [IU] via SUBCUTANEOUS
  Administered 2019-09-26 – 2019-09-28 (×6): 2 [IU] via SUBCUTANEOUS
  Administered 2019-09-29: 3 [IU] via SUBCUTANEOUS
  Administered 2019-09-30 – 2019-10-01 (×6): 2 [IU] via SUBCUTANEOUS
  Administered 2019-10-02: 5 [IU] via SUBCUTANEOUS
  Administered 2019-10-02 – 2019-10-03 (×2): 2 [IU] via SUBCUTANEOUS
  Administered 2019-10-03: 0 [IU] via SUBCUTANEOUS
  Administered 2019-10-04: 2 [IU] via SUBCUTANEOUS

## 2019-09-05 MED ORDER — ALBUTEROL SULFATE (2.5 MG/3ML) 0.083% IN NEBU
2.5000 mg | INHALATION_SOLUTION | RESPIRATORY_TRACT | Status: DC | PRN
  Administered 2019-09-08 – 2019-09-29 (×7): 2.5 mg via RESPIRATORY_TRACT
  Filled 2019-09-05 (×7): qty 3

## 2019-09-05 MED ORDER — VITAMIN D (ERGOCALCIFEROL) 1.25 MG (50000 UNIT) PO CAPS
50000.0000 [IU] | ORAL_CAPSULE | ORAL | Status: DC
  Filled 2019-09-05: qty 1

## 2019-09-05 MED ORDER — ROSUVASTATIN CALCIUM 5 MG PO TABS
20.0000 mg | ORAL_TABLET | Freq: Every day | ORAL | Status: DC
  Administered 2019-09-06 – 2019-10-04 (×28): 20 mg
  Filled 2019-09-05 (×12): qty 4
  Filled 2019-09-05: qty 1
  Filled 2019-09-05 (×17): qty 4

## 2019-09-05 MED ORDER — VITAMIN D (ERGOCALCIFEROL) 1.25 MG (50000 UNIT) PO CAPS: 50000.0000 [IU] | ORAL_CAPSULE | ORAL | Status: DC

## 2019-09-05 MED ORDER — CHLORHEXIDINE GLUCONATE CLOTH 2 % EX PADS
6.0000 | MEDICATED_PAD | Freq: Every day | CUTANEOUS | Status: DC
  Administered 2019-09-06 – 2019-09-07 (×2): 6 via TOPICAL

## 2019-09-05 MED ORDER — MIDAZOLAM HCL 2 MG/2ML IJ SOLN: 2.0000 mg | INTRAMUSCULAR | Status: DC | PRN

## 2019-09-05 MED ORDER — PRO-STAT SUGAR FREE PO LIQD
30.0000 mL | Freq: Two times a day (BID) | ORAL | Status: DC
  Administered 2019-09-05 – 2019-09-07 (×5): 30 mL
  Filled 2019-09-05 (×4): qty 30

## 2019-09-05 MED ORDER — PIPERACILLIN-TAZOBACTAM 3.375 G IVPB
3.3750 g | Freq: Once | INTRAVENOUS | Status: AC
  Administered 2019-09-05: 3.375 g via INTRAVENOUS
  Filled 2019-09-05: qty 50

## 2019-09-05 MED ORDER — VITAMIN D (ERGOCALCIFEROL) 1.25 MG (50000 UNIT) PO CAPS
50000.0000 [IU] | ORAL_CAPSULE | ORAL | Status: DC
  Administered 2019-09-13 – 2019-09-27 (×3): 50000 [IU]
  Filled 2019-09-05 (×5): qty 1

## 2019-09-05 MED ORDER — POLYETHYLENE GLYCOL 3350 17 G PO PACK
17.0000 g | PACK | Freq: Every day | ORAL | Status: DC
  Administered 2019-09-05 – 2019-09-11 (×3): 17 g via ORAL
  Filled 2019-09-05 (×5): qty 1

## 2019-09-05 MED ORDER — VITAL HIGH PROTEIN PO LIQD
1000.0000 mL | ORAL | Status: DC
  Administered 2019-09-05 – 2019-09-06 (×2): 1000 mL

## 2019-09-05 MED ORDER — METHYLPREDNISOLONE SODIUM SUCC 40 MG IJ SOLR: 40.0000 mg | Freq: Two times a day (BID) | INTRAMUSCULAR | Status: DC

## 2019-09-05 MED ORDER — PANTOPRAZOLE SODIUM 40 MG PO PACK
40.0000 mg | PACK | Freq: Every day | ORAL | Status: DC
  Administered 2019-09-05 – 2019-10-04 (×29): 40 mg
  Filled 2019-09-05 (×29): qty 20

## 2019-09-05 MED ORDER — ROSUVASTATIN CALCIUM 20 MG PO TABS: 20.0000 mg | ORAL_TABLET | Freq: Every day | ORAL | Status: DC

## 2019-09-05 MED ORDER — ORAL CARE MOUTH RINSE
15.0000 mL | OROMUCOSAL | Status: DC
  Administered 2019-09-05 – 2019-10-04 (×193): 15 mL via OROMUCOSAL

## 2019-09-05 MED ORDER — VANCOMYCIN HCL IN DEXTROSE 1-5 GM/200ML-% IV SOLN
1000.0000 mg | Freq: Once | INTRAVENOUS | Status: AC
  Administered 2019-09-05: 1000 mg via INTRAVENOUS
  Filled 2019-09-05: qty 200

## 2019-09-05 MED ORDER — ETOMIDATE 2 MG/ML IV SOLN
INTRAVENOUS | Status: AC | PRN
  Administered 2019-09-05: 20 mg via INTRAVENOUS

## 2019-09-05 MED ORDER — IPRATROPIUM-ALBUTEROL 0.5-2.5 (3) MG/3ML IN SOLN: 3.0000 mL | Freq: Four times a day (QID) | RESPIRATORY_TRACT | Status: DC

## 2019-09-05 MED ORDER — ASPIRIN 81 MG PO CHEW
81.0000 mg | CHEWABLE_TABLET | Freq: Every day | ORAL | Status: DC
  Administered 2019-09-05 – 2019-10-04 (×29): 81 mg
  Filled 2019-09-05 (×29): qty 1

## 2019-09-05 MED ORDER — FENTANYL CITRATE (PF) 100 MCG/2ML IJ SOLN
INTRAMUSCULAR | Status: AC
  Administered 2019-09-05: 50 ug via INTRAVENOUS
  Filled 2019-09-05: qty 2

## 2019-09-05 MED ORDER — ASPIRIN 81 MG PO CHEW: 81.0000 mg | CHEWABLE_TABLET | Freq: Every day | ORAL | Status: DC

## 2019-09-05 MED ORDER — FENTANYL CITRATE (PF) 100 MCG/2ML IJ SOLN
50.0000 ug | Freq: Once | INTRAMUSCULAR | Status: AC
  Administered 2019-09-05: 50 ug via INTRAVENOUS

## 2019-09-05 MED ORDER — PHENOL 1.4 % MT LIQD
1.0000 | OROMUCOSAL | Status: DC | PRN
  Administered 2019-09-05: 1 via OROMUCOSAL
  Filled 2019-09-05: qty 177

## 2019-09-05 MED ORDER — SODIUM BICARBONATE 8.4 % IV SOLN
50.0000 meq | Freq: Once | INTRAVENOUS | Status: AC
  Administered 2019-09-05: 50 meq via INTRAVENOUS
  Filled 2019-09-05: qty 50

## 2019-09-05 MED ORDER — PIPERACILLIN-TAZOBACTAM 3.375 G IVPB
3.3750 g | Freq: Three times a day (TID) | INTRAVENOUS | Status: DC
  Administered 2019-09-05 – 2019-09-09 (×12): 3.375 g via INTRAVENOUS
  Filled 2019-09-05 (×12): qty 50

## 2019-09-05 MED ORDER — FUROSEMIDE 10 MG/ML IJ SOLN
40.0000 mg | Freq: Every day | INTRAMUSCULAR | Status: DC
  Administered 2019-09-05: 40 mg via INTRAVENOUS
  Filled 2019-09-05: qty 4

## 2019-09-05 MED ORDER — TICAGRELOR 90 MG PO TABS
90.0000 mg | ORAL_TABLET | Freq: Two times a day (BID) | ORAL | Status: DC
  Administered 2019-09-05 – 2019-09-10 (×11): 90 mg
  Filled 2019-09-05 (×12): qty 1

## 2019-09-05 MED ORDER — METHYLPREDNISOLONE SODIUM SUCC 40 MG IJ SOLR: 40.0000 mg | INTRAMUSCULAR | Status: DC

## 2019-09-05 MED ORDER — PROPOFOL 1000 MG/100ML IV EMUL
INTRAVENOUS | Status: AC
  Administered 2019-09-05: 10 ug/kg/min via INTRAVENOUS
  Filled 2019-09-05: qty 100

## 2019-09-05 MED ORDER — ACETAMINOPHEN 325 MG PO TABS
650.0000 mg | ORAL_TABLET | Freq: Four times a day (QID) | ORAL | Status: DC | PRN
  Administered 2019-09-10 – 2019-10-04 (×22): 650 mg
  Filled 2019-09-05 (×23): qty 2

## 2019-09-05 MED ORDER — DOCUSATE SODIUM 50 MG/5ML PO LIQD: 100.0000 mg | Freq: Two times a day (BID) | ORAL | Status: DC

## 2019-09-05 MED ORDER — SODIUM CHLORIDE 0.9 % IV SOLN
INTRAVENOUS | Status: DC | PRN
  Administered 2019-09-05: 500 mL via INTRAVENOUS
  Administered 2019-09-15: 250 mL via INTRAVENOUS
  Administered 2019-09-21 – 2019-09-28 (×2): 500 mL via INTRAVENOUS

## 2019-09-05 MED ORDER — DOCUSATE SODIUM 50 MG/5ML PO LIQD
100.0000 mg | Freq: Two times a day (BID) | ORAL | Status: DC
  Administered 2019-09-05 – 2019-10-03 (×41): 100 mg
  Filled 2019-09-05 (×55): qty 10

## 2019-09-05 MED ORDER — IPRATROPIUM-ALBUTEROL 0.5-2.5 (3) MG/3ML IN SOLN
3.0000 mL | Freq: Four times a day (QID) | RESPIRATORY_TRACT | Status: DC
  Administered 2019-09-05 – 2019-09-26 (×81): 3 mL via RESPIRATORY_TRACT
  Filled 2019-09-05 (×80): qty 3

## 2019-09-05 MED ORDER — MIDAZOLAM HCL 2 MG/2ML IJ SOLN
2.0000 mg | INTRAMUSCULAR | Status: DC | PRN
  Administered 2019-09-05 – 2019-09-13 (×27): 2 mg via INTRAVENOUS
  Filled 2019-09-05 (×27): qty 2

## 2019-09-05 MED ORDER — FENTANYL 2500MCG IN NS 250ML (10MCG/ML) PREMIX INFUSION
50.0000 ug/h | INTRAVENOUS | Status: DC
  Administered 2019-09-05: 50 ug/h via INTRAVENOUS
  Administered 2019-09-06: 250 ug/h via INTRAVENOUS
  Administered 2019-09-06: 200 ug/h via INTRAVENOUS
  Administered 2019-09-06: 250 ug/h via INTRAVENOUS
  Administered 2019-09-07 – 2019-09-08 (×4): 300 ug/h via INTRAVENOUS
  Administered 2019-09-09 – 2019-09-10 (×5): 200 ug/h via INTRAVENOUS
  Administered 2019-09-11: 50 ug/h via INTRAVENOUS
  Administered 2019-09-13: 200 ug/h via INTRAVENOUS
  Filled 2019-09-05 (×17): qty 250

## 2019-09-05 MED ORDER — FENTANYL BOLUS VIA INFUSION
50.0000 ug | INTRAVENOUS | Status: DC | PRN
  Filled 2019-09-05: qty 50

## 2019-09-05 NOTE — ED Provider Notes (Signed)
[facility] EMERGENCY DEPARTMENT Provider Note   CSN: [id number] Arrival date & time: 09/05/19  3790     History Chief Complaint  Patient presents with  . Respiratory Distress    Chelsea Allison is a 64 y.o. female.  Patient is a 64 year old female with extensive past medical history including coronary artery disease with multiple stents, lung cancer, colon cancer, COPD, hypertension.  Patient brought today for evaluation of respiratory distress.  She was apparently found by a friend to be altered and having difficulty breathing.  911 was called and the patient was transported here by EMS.  Patient arrived here obtunded and unable to contribute any history.  The history is provided by the patient.       Past Medical History:  Diagnosis Date  . CAD (coronary artery disease) 10/20 DES to pRCA, DES mCx   . Cancer of lung ([place])    Twice  . Colon cancer ([place])   . COPD (chronic obstructive pulmonary disease) ([place])   . Hyperlipidemia   . Hypertension   . Ischemic cardiomyopathy   . Neuropathy    Secondary to chemo  . STEMI (ST elevation myocardial infarction) ([place]) 12/12/2018  . Tobacco abuse     Patient Active Problem List   Diagnosis Date Noted  . Ventricular tachycardia ([place]) 08/29/2019  . History of lung cancer in adulthood 08/29/2019  . History of anal cancer 08/29/2019  . Acute respiratory failure ([place]) 08/11/2019  . CHF exacerbation ([place]) 08/11/2019  . AKI (acute kidney injury) ([place]) 08/11/2019  . NSTEMI (non-ST elevated myocardial infarction) ([place])   . Elevated troponin   . Renal insufficiency   . Chronic combined systolic and diastolic heart failure ([place])   . Respiratory failure with hypoxia and hypercapnia ([place]) 01/08/2019  . Hyperlipidemia 12/20/2018  . Hypertension 12/20/2018  . CAP (community acquired pneumonia) 12/20/2018  . Tobacco abuse 12/20/2018  . CAD in native artery 12/20/2018  . Unstable angina ([place])   . Hypoxemia   . Acute  respiratory failure with hypoxemia ([place])   . Acute pulmonary edema ([facility])   . COPD with acute exacerbation ([place])   . STEMI (ST elevation myocardial infarction) ([place]) 12/12/2018  . Acute MI, lateral wall ([place]) 12/12/2018  . Acute on chronic respiratory failure with hypercapnia ([place])   . Acute combined systolic and diastolic heart failure [facility])     Past Surgical History:  Procedure Laterality Date  . ABDOMINAL SURGERY     For colon cancer  . COLOSTOMY    . CORONARY STENT INTERVENTION N/A 12/20/2018   Procedure: CORONARY STENT INTERVENTION - Right;  Surgeon: Burnell Blanks, MD;  Location: [facility];  Service: Cardiovascular;  Laterality: N/A;  . CORONARY/GRAFT ACUTE MI REVASCULARIZATION N/A 12/12/2018   Procedure: Coronary/Graft Acute MI Revascularization;  Surgeon: Belva Crome, MD;  Location: [facility];  Service: Cardiovascular;  Laterality: N/A;  . LEFT HEART CATH AND CORONARY ANGIOGRAPHY N/A 12/12/2018   Procedure: LEFT HEART CATH AND CORONARY ANGIOGRAPHY;  Surgeon: Belva Crome, MD;  Location: [facility];  Service: Cardiovascular;  Laterality: N/A;  . LEFT HEART CATH AND CORONARY ANGIOGRAPHY N/A 08/11/2019   Procedure: LEFT HEART CATH AND CORONARY ANGIOGRAPHY;  Surgeon: Burnell Blanks, MD;  Location: [facility];  Service: Cardiovascular;  Laterality: N/A;     OB History   No obstetric history on file.     Family History  Problem Relation Age of Onset  . Hypertension Mother   .  Hypertension Father     Social History   Tobacco Use  . Smoking status: Current Every Day Smoker    Types: Cigarettes  . Smokeless tobacco: Never Used  Vaping Use  . Vaping Use: Never used  Substance Use Topics  . Alcohol use: Never  . Drug use: Never    Home Medications Prior to Admission medications   Medication Sig Start Date End Date Taking? Authorizing Provider  acetaminophen (TYLENOL) 500 MG tablet Take 2 tablets (1,000 mg total) by  mouth every 8 (eight) hours as needed for mild pain. 08/29/19   Reed, Tiffany L, DO  albuterol (VENTOLIN HFA) 108 (90 Base) MCG/ACT inhaler Inhale 2 puffs into the lungs every 6 (six) hours as needed for wheezing or shortness of breath. 08/29/19   Reed, Tiffany L, DO  aspirin 81 MG chewable tablet Chew 1 tablet (81 mg total) by mouth daily. 08/29/19   Reed, Tiffany L, DO  BREO ELLIPTA 100-25 MCG/INH AEPB Inhale 1 puff into the lungs daily. 08/29/19   Reed, Tiffany L, DO  Cyanocobalamin (B-12) 1000 MCG CAPS Take 1 capsule by mouth every other day. 08/29/19   Reed, Tiffany L, DO  docusate sodium (COLACE) 100 MG capsule Take 1 capsule (100 mg total) by mouth 2 (two) times daily. 08/29/19   Reed, Tiffany L, DO  feeding supplement, ENSURE ENLIVE, (ENSURE ENLIVE) LIQD Take 237 mLs by mouth 3 (three) times daily between meals. 08/25/19   Terrilee Croak, MD  furosemide (LASIX) 20 MG tablet Take 1 tablet (20 mg total) by mouth daily. 08/29/19   Reed, Tiffany L, DO  guaiFENesin (MUCINEX) 600 MG 12 hr tablet Take 1 tablet (600 mg total) by mouth 2 (two) times daily. 08/29/19   Reed, Tiffany L, DO  ipratropium-albuterol (DUONEB) 0.5-2.5 (3) MG/3ML SOLN Inhale 3 mLs into the lungs every 6 (six) hours as needed. 08/29/19   Reed, Tiffany L, DO  losartan (COZAAR) 25 MG tablet Take 0.5 tablets (12.5 mg total) by mouth daily. 08/29/19   Reed, Tiffany L, DO  melatonin 3 MG TABS tablet Take 1 tablet (3 mg total) by mouth at bedtime as needed (insomnia/anxiety). 08/29/19   Reed, Tiffany L, DO  metoprolol succinate (TOPROL-XL) 25 MG 24 hr tablet Take 1 tablet (25 mg total) by mouth daily. 08/29/19   Reed, Tiffany L, DO  Multiple Vitamin (MULTIVITAMIN WITH MINERALS) TABS tablet Take 1 tablet by mouth daily. 08/29/19   Reed, Rexene Edison, DO  NON FORMULARY DIET:Regular NAS Heart Healthy CCD diet 08/25/19   [provider]  pantoprazole (PROTONIX) 40 MG tablet Take 1 tablet (40 mg total) by mouth daily. 08/29/19   Reed, Tiffany L, DO   polyethylene glycol (MIRALAX / GLYCOLAX) 17 g packet Take 17 g by mouth daily as needed for moderate constipation. 08/29/19   Reed, Tiffany L, DO  potassium chloride SA (KLOR-CON) 20 MEQ tablet Take 1 tablet (20 mEq total) by mouth daily. 08/29/19   Reed, Tiffany L, DO  predniSONE (DELTASONE) 10 MG tablet Take 1 tablet daily for 2 days, then stop 08/29/19   Reed, Tiffany L, DO  rosuvastatin (CRESTOR) 40 MG tablet Take 0.5 tablets (20 mg total) by mouth daily. 08/29/19   Reed, Tiffany L, DO  sodium chloride (OCEAN) 0.65 % SOLN nasal spray Place 1 spray into both nostrils as needed for congestion. 08/29/19   Reed, Tiffany L, DO  sucralfate (CARAFATE) 1 g tablet Take 1 tablet (1 g total) by mouth as needed. For stomach discomfort  08/29/19   Reed, Tiffany L, DO  ticagrelor (BRILINTA) 90 MG TABS tablet Take 1 tablet (90 mg total) by mouth 2 (two) times daily. 08/29/19   Reed, Tiffany L, DO  umeclidinium bromide (INCRUSE ELLIPTA) 62.5 MCG/INH AEPB Inhale 1 puff into the lungs daily. 08/29/19   Reed, Tiffany L, DO  Vitamin D, Ergocalciferol, (DRISDOL) 1.25 MG (50000 UNIT) CAPS capsule Take 1 capsule (50,000 Units total) by mouth every 7 (seven) days. 08/29/19   Reed, Tiffany L, DO    Allergies    Statins, Hydrochlorothiazide, Hydrocodone, Lisinopril, and Oxycodone  Review of Systems   Review of Systems  Unable to perform ROS: Acuity of condition    Physical Exam Updated Vital Signs BP (!) 146/91 (BP Location: Right Arm)   Pulse (!) 125   Temp 98.1 F (36.7 C) (Oral)   Resp (!) 35   SpO2 98%   Physical Exam Vitals and nursing note reviewed.  Constitutional:      General: She is in acute distress.     Appearance: She is well-developed. She is diaphoretic.     Comments: Patient is acutely on chronically ill-appearing.  She is obtunded and not responding.  HENT:     Head: Normocephalic and atraumatic.  Cardiovascular:     Rate and Rhythm: Normal rate and regular rhythm.     Heart sounds: No  murmur heard.  No friction rub. No gallop.   Pulmonary:     Effort: Respiratory distress present.     Comments: Patient arrives here in severe respiratory distress.  She has accessory muscle use, is tachypneic, with rhonchi throughout. Abdominal:     General: Bowel sounds are normal. There is no distension.     Palpations: Abdomen is soft.     Tenderness: There is no abdominal tenderness.  Musculoskeletal:        General: Normal range of motion.     Cervical back: Normal range of motion and neck supple.  Skin:    General: Skin is warm.  Neurological:     Comments: Patient obtunded.  Not following commands.     ED Results / Procedures / Treatments   Labs (all labs ordered are listed, but only abnormal results are displayed) Labs Reviewed  URINE CULTURE  CULTURE, BLOOD (ROUTINE X 2)  CULTURE, BLOOD (ROUTINE X 2)  CBC WITH DIFFERENTIAL/PLATELET  COMPREHENSIVE METABOLIC PANEL  BLOOD GAS, ARTERIAL  BRAIN NATRIURETIC PEPTIDE  LACTIC ACID, PLASMA  LACTIC ACID, PLASMA  URINALYSIS, ROUTINE W REFLEX MICROSCOPIC  TROPONIN I (HIGH SENSITIVITY)    EKG EKG Interpretation  Date/Time:  Tuesday September 05 2019 07:30:19 EDT Ventricular Rate:  124 PR Interval:    QRS Duration: 110 QT Interval:  284 QTC Calculation: 408 R Axis:   74 Text Interpretation: Sinus tachycardia Ventricular premature complex Probable left atrial enlargement Low voltage with right axis deviation Anteroseptal infarct, old Nonspecific T abnormalities, lateral leads No significant change since 08/14/2019 Confirmed by Veryl Speak [phone]) on 09/05/2019 8:31:43 AM   Radiology No results found.  Procedures Procedures (including critical care time)  Medications Ordered in ED Medications  propofol (DIPRIVAN) 1000 MG/100ML infusion (has no administration in time range)  succinylcholine (ANECTINE) injection (150 mg Intravenous Given 09/05/19 0742)  etomidate (AMIDATE) injection (20 mg Intravenous Given 09/05/19 0741)     ED Course  I have reviewed the triage vital signs and the nursing notes.  Pertinent labs & imaging results that were available during my care of the patient were reviewed by me  and considered in my medical decision making (see chart for details).    MDM Rules/Calculators/A&P  Patient is a 64 year old female with extensive past medical history as described in the HPI.  She is brought here for evaluation of altered mental status and respiratory distress.  Patient adds no additional history secondary to altered mental status/severity of condition.  She arrives here in severe respiratory distress.  She is exhibiting accessory muscle use and mottling of the extremities.  Patient not responsive upon arrival.  Laboratory studies obtained including arterial blood gas.  This showed initial pH of 6.95 with PCO2 greater than 97.  Due to her altered mental status, I do not feel as though BiPAP is an option and after review of her medical record it appears as though she is a full code.  She was recently intubated multiple times during her previous hospitalization.  RSI was performed using 20 mg of etomidate and 150 mg of succinylcholine.  A 7.5 endotracheal tube was placed using the glide scope by PA student Rollene Rotunda under my direct supervision.  The cords were easily visualized and the tube was passed without difficulty.  Tube placement confirmed with direct visualization, end-tidal CO2, and auscultation over the chest and stomach.  Remainder of laboratory studies reveal a troponin of 272, likely demand ischemia, but are otherwise essentially unremarkable.  Chest x-ray suggestive of bilateral lower lobe infiltrates.  Blood cultures obtained and antibiotics for HCAP initiated.  I have discussed the patient with the critical care service who has evaluated and will admit.  CRITICAL CARE Performed by: Veryl Speak Total critical care time: 70 minutes Critical care time was exclusive of separately billable  procedures and treating other patients. Critical care was necessary to treat or prevent imminent or life-threatening deterioration. Critical care was time spent personally by me on the following activities: development of treatment plan with patient and/or surrogate as well as nursing, discussions with consultants, evaluation of patient's response to treatment, examination of patient, obtaining history from patient or surrogate, ordering and performing treatments and interventions, ordering and review of laboratory studies, ordering and review of radiographic studies, pulse oximetry and re-evaluation of patient's condition.   Final Clinical Impression(s) / ED Diagnoses Final diagnoses:  None    Rx / DC Orders ED Discharge Orders    None       Veryl Speak, MD 09/05/19 1030

## 2019-09-05 NOTE — Progress Notes (Cosign Needed Addendum)
STUDENT NOTE - FOR EDUCATIONAL USE ONLY - NOT PART OF EMR  NAME:  Chelsea Allison, MRN:  [id number], DOB:  06-29-1955, LOS: 0 ADMISSION DATE:  09/05/2019, CONSULTATION DATE:  09/05/19 REFERRING MD:  Stark Jock CHIEF COMPLAINT:  Respiratory failure    Brief History   64 year old female presenting with complaints of respiratory distress found to have acute hypercarbic respiratory failure.  History of present illness   Chelsea Allison is a 64 year old female with PMH significant for CHF, COPD, stage III NSCLC s/p resection, and smoker who presents with complaint of respiratory distress found by EMS to have initial SpO2 in 70's. Normally wears 1L O2 at home. She was given Albuterol and placed on NRB by EMS. Upon arrival to ED she was found to have acute hypercarbic respiratory failure and was subsequently intubated. PCCM was consulted for further management.  Notably, Chelsea Allison has been hospitalized several times in the past, most recently with a prolonged hospitalization from 6/3-6/18 at [facility] for which she required multiple attempts at vent weaning and extubation.   Past Medical History  COPD CHF NSCLC, Stage III s/p resection Smoker Anal cancer s/p colostomy CAD - 10/20 DES to pRCA and mCx HPL HTN Ischemic cardiomyopathy  Significant Hospital Events   6/29 Admitted. Intubated   Consults:  PCCM  Procedures:  6/29 ETT >>  Significant Diagnostic Tests:  6/29 CXR >> Increasing patchy opacities in right base and left medial upper lobe  Micro Data:  6/29 Blood cultures >>  6/29 Resp culture >> 6/29 Urine culture >> 6/29 SARS CoV2 >>   Antimicrobials:  6/29 Vanco 6/29 Zosyn  Interim history/subjective:   Objective   Blood pressure 109/74, pulse (!) 128, temperature 98.1 F (36.7 C), temperature source Oral, resp. rate (!) 28, height 5\' 9"  (1.753 m), weight 81.6 kg, SpO2 95 %.    Vent Mode: PCV FiO2 (%):  [50 %-100 %] 50 % Set Rate:  [14 bmp-24 bmp] 14 bmp Vt Set:  [450 mL] 450  mL PEEP:  [10 cmH20] 10 cmH20 Plateau Pressure:  [29 cmH20-36 cmH20] 29 cmH20  No intake or output data in the 24 hours ending 09/05/19 0917 Filed Weights   09/05/19 0802  Weight: 81.6 kg    Examination: General: Elderly white female, appears frail, laying in bed with eyes closed and in no obvious distress on MV HENT: Non-icteric sclera. Poor dentition. 7.5 ETT and OGT in place. MMM Lungs: Diffuse coarse bilateral rhonchi throughout. Equal chest rise and fall on MV. No accessory muscle use. Cardiovascular: No JVD. ST on monitor. Capillary refill immediate Abdomen: Soft, non-tender, not distended. LLQ colostomy; pink and moist Extremities: Normal muscle tone and bulk. No obvious joint deformities Neuro: Opens eyes to voice. PERRL. MAE GU: Defer  Resolved Hospital Problem list     Assessment & Plan:  Acute encephalopathy Possibly from hypoxia/hypercarbia, sedating medications, hypotension, possible infectious component -Normalize BP; Maintain MAP >65 -RASS 0 to -1 -Vent management as detailed below -Check LFTs  Acute hypercarbic respiratory failure Acute on chronic hypoxic respiratory failure Uncompensated respiratory acidosis History of COPD without exacerbation Initial ABG 7.21 / 78 / 415 / 34. Likely secondary to suspected PNA but cannot rule-out possible COPD or CHF exacerbation. -Current on PCV. Change to PRVC -Increase rate from 16 to 20 -Peak pressure < 30 -Repeat ABG following vent changes -Daily SAT/SBT -VAP prevention bundle -Check BNP -DuoNebs scheduled every 6 hours -Albuterol nebs PRN every 2 hours -SpO2 goal 88-92%  Suspected community acquired  pneumonia Recent respiratory cultures from early June with enterococcus faecalis. Recently finished prednisone taper and antibiotics. Evolving infiltrates in CXR with acute hypercarbic respiratory failure necessitating intubation and MV.  -Respiratory and blood cultures STAT -Initiate broad spectrum antibiotics -  Vanc and Zosyn -Narrow therapy based on cultures -Check procalcitonin  Pre-diabetes with acute hyperglycemia Likely related to stress of critical illness and recent steroid use. -Check A1c -Initiate SSI -BG goal 140-180mg /dL  CAD DES 10/20 -Continue ASA, Brilinta  History of CHF; combined HFpEF and HFrEF Checking BNP. Does not appear grossly volume overloaded on exam. Recent Echo 6/5 EF 35-40%. -Resume home dose lasix  History of HTN Hypotensive - normotensive since arrival -Hold home anti-hypertensives in setting of BP lability   Best practice:  Diet: Nutrition consult, begin TF Pain/Anxiety/Delirium protocol (if indicated): Fentanyl drip VAP protocol (if indicated): Yes DVT prophylaxis: Enox GI prophylaxis: Protonix Glucose control: SSI Mobility: BR Code Status: Full Family Communication:  Disposition: ICU  Labs   CBC: Recent Labs  Lab 09/05/19 0735 09/05/19 0850  WBC 10.7*  --   NEUTROABS 6.6  --   HGB 10.3* 9.9*  HCT 37.2 29.0*  MCV 93.7  --   PLT 246  --     Basic Metabolic Panel: Recent Labs  Lab 09/05/19 0735 09/05/19 0850  NA 139 142  K 3.9 3.5  CL 102  --   CO2 21*  --   GLUCOSE 320*  --   BUN 15  --   CREATININE 1.09*  --   CALCIUM 9.0  --    GFR: Estimated Creatinine Clearance: 60.4 mL/min (A) (by C-G formula based on SCr of 1.09 mg/dL (H)). Recent Labs  Lab 09/05/19 0735  WBC 10.7*    Liver Function Tests: Recent Labs  Lab 09/05/19 0735  AST 17  ALT 24  ALKPHOS 81  BILITOT 0.6  PROT 6.2*  ALBUMIN 2.5*   No results for input(s): LIPASE, AMYLASE in the last 168 hours. No results for input(s): AMMONIA in the last 168 hours.  ABG    Component Value Date/Time   PHART 7.216 (L) 09/05/2019 0850   PCO2ART 78.7 (HH) 09/05/2019 0850   PO2ART 415 (H) 09/05/2019 0850   HCO3 32.0 (H) 09/05/2019 0850   TCO2 34 (H) 09/05/2019 0850   ACIDBASEDEF 1.0 08/15/2019 1944   O2SAT 100.0 09/05/2019 0850     Coagulation Profile: No  results for input(s): INR, PROTIME in the last 168 hours.  Cardiac Enzymes: No results for input(s): CKTOTAL, CKMB, CKMBINDEX, TROPONINI in the last 168 hours.  HbA1C: Hgb A1c MFr Bld  Date/Time Value Ref Range Status  08/11/2019 08:00 AM 5.8 (H) 4.8 - 5.6 % Final    Comment:    (NOTE) Pre diabetes:          5.7%-6.4% Diabetes:              >6.4% Glycemic control for   <7.0% adults with diabetes   01/10/2019 04:12 AM 5.7 (H) 4.8 - 5.6 % Final    Comment:    (NOTE) Pre diabetes:          5.7%-6.4% Diabetes:              >6.4% Glycemic control for   <7.0% adults with diabetes     CBG: No results for input(s): GLUCAP in the last 168 hours.  Review of Systems:   Unable to obtain 2/2 intubation sedation  Past Medical History  She,  has a past medical history  of CAD (coronary artery disease) 10/20 DES to pRCA, DES mCx, Cancer of lung ([place]), Colon cancer ([employer]), COPD (chronic obstructive pulmonary disease) ([place]), Hyperlipidemia, Hypertension, Ischemic cardiomyopathy, Neuropathy, STEMI (ST elevation myocardial infarction) ([place]) (12/12/2018), and Tobacco abuse.   Surgical History    Past Surgical History:  Procedure Laterality Date  . ABDOMINAL SURGERY     For colon cancer  . COLOSTOMY    . CORONARY STENT INTERVENTION N/A 12/20/2018   Procedure: CORONARY STENT INTERVENTION - Right;  Surgeon: Burnell Blanks, MD;  Location: [place] [facility];  Service: Cardiovascular;  Laterality: N/A;  . CORONARY/GRAFT ACUTE MI REVASCULARIZATION N/A 12/12/2018   Procedure: Coronary/Graft Acute MI Revascularization;  Surgeon: Belva Crome, MD;  Location: [place] [facility];  Service: Cardiovascular;  Laterality: N/A;  . LEFT HEART CATH AND CORONARY ANGIOGRAPHY N/A 12/12/2018   Procedure: LEFT HEART CATH AND CORONARY ANGIOGRAPHY;  Surgeon: Belva Crome, MD;  Location: [place] [facility];  Service: Cardiovascular;  Laterality: N/A;  . LEFT HEART CATH AND CORONARY ANGIOGRAPHY N/A 08/11/2019    Procedure: LEFT HEART CATH AND CORONARY ANGIOGRAPHY;  Surgeon: Burnell Blanks, MD;  Location: [place] [facility];  Service: Cardiovascular;  Laterality: N/A;     Social History   reports that she has been smoking cigarettes. She has never used smokeless tobacco. She reports that she does not drink alcohol and does not use drugs.   Family History   Her family history includes Hypertension in her father and mother.   Allergies Allergies  Allergen Reactions  . Statins Other (See Comments)  . Hydrochlorothiazide Other (See Comments)    headaches  . Hydrocodone Nausea And Vomiting  . Lisinopril Other (See Comments)    Headache   . Oxycodone Nausea Only     Home Medications  Prior to Admission medications   Medication Sig Start Date End Date Taking? Authorizing Provider  acetaminophen (TYLENOL) 500 MG tablet Take 2 tablets (1,000 mg total) by mouth every 8 (eight) hours as needed for mild pain. 08/29/19   Reed, Tiffany L, DO  albuterol (VENTOLIN HFA) 108 (90 Base) MCG/ACT inhaler Inhale 2 puffs into the lungs every 6 (six) hours as needed for wheezing or shortness of breath. 08/29/19   Reed, Tiffany L, DO  aspirin 81 MG chewable tablet Chew 1 tablet (81 mg total) by mouth daily. 08/29/19   Reed, Tiffany L, DO  BREO ELLIPTA 100-25 MCG/INH AEPB Inhale 1 puff into the lungs daily. 08/29/19   Reed, Tiffany L, DO  Cyanocobalamin (B-12) 1000 MCG CAPS Take 1 capsule by mouth every other day. 08/29/19   Reed, Tiffany L, DO  docusate sodium (COLACE) 100 MG capsule Take 1 capsule (100 mg total) by mouth 2 (two) times daily. 08/29/19   Reed, Tiffany L, DO  feeding supplement, ENSURE ENLIVE, (ENSURE ENLIVE) LIQD Take 237 mLs by mouth 3 (three) times daily between meals. 08/25/19   Terrilee Croak, MD  furosemide (LASIX) 20 MG tablet Take 1 tablet (20 mg total) by mouth daily. 08/29/19   Reed, Tiffany L, DO  guaiFENesin (MUCINEX) 600 MG 12 hr tablet Take 1 tablet (600 mg total) by mouth 2 (two) times  daily. 08/29/19   Reed, Tiffany L, DO  ipratropium-albuterol (DUONEB) 0.5-2.5 (3) MG/3ML SOLN Inhale 3 mLs into the lungs every 6 (six) hours as needed. 08/29/19   Reed, Tiffany L, DO  losartan (COZAAR) 25 MG tablet Take 0.5 tablets (12.5 mg total) by mouth daily. 08/29/19   Mariea Clonts,  Tiffany L, DO  melatonin 3 MG TABS tablet Take 1 tablet (3 mg total) by mouth at bedtime as needed (insomnia/anxiety). 08/29/19   Reed, Tiffany L, DO  metoprolol succinate (TOPROL-XL) 25 MG 24 hr tablet Take 1 tablet (25 mg total) by mouth daily. 08/29/19   Reed, Tiffany L, DO  Multiple Vitamin (MULTIVITAMIN WITH MINERALS) TABS tablet Take 1 tablet by mouth daily. 08/29/19   Reed, Rexene Edison, DO  NON FORMULARY DIET:Regular NAS Heart Healthy CCD diet 08/25/19   [provider]  pantoprazole (PROTONIX) 40 MG tablet Take 1 tablet (40 mg total) by mouth daily. 08/29/19   Reed, Tiffany L, DO  polyethylene glycol (MIRALAX / GLYCOLAX) 17 g packet Take 17 g by mouth daily as needed for moderate constipation. 08/29/19   Reed, Tiffany L, DO  potassium chloride SA (KLOR-CON) 20 MEQ tablet Take 1 tablet (20 mEq total) by mouth daily. 08/29/19   Reed, Tiffany L, DO  predniSONE (DELTASONE) 10 MG tablet Take 1 tablet daily for 2 days, then stop 08/29/19   Reed, Tiffany L, DO  rosuvastatin (CRESTOR) 40 MG tablet Take 0.5 tablets (20 mg total) by mouth daily. 08/29/19   Reed, Tiffany L, DO  sodium chloride (OCEAN) 0.65 % SOLN nasal spray Place 1 spray into both nostrils as needed for congestion. 08/29/19   Reed, Tiffany L, DO  sucralfate (CARAFATE) 1 g tablet Take 1 tablet (1 g total) by mouth as needed. For stomach discomfort 08/29/19   Reed, Tiffany L, DO  ticagrelor (BRILINTA) 90 MG TABS tablet Take 1 tablet (90 mg total) by mouth 2 (two) times daily. 08/29/19   Reed, Tiffany L, DO  umeclidinium bromide (INCRUSE ELLIPTA) 62.5 MCG/INH AEPB Inhale 1 puff into the lungs daily. 08/29/19   Reed, Tiffany L, DO  Vitamin D, Ergocalciferol, (DRISDOL)  1.25 MG (50000 UNIT) CAPS capsule Take 1 capsule (50,000 Units total) by mouth every 7 (seven) days. 08/29/19   Gayland Curry, DO     Critical care time: 60 minutes    Electronically signed by: Myrle Sheng, [place], 09/05/19 1354

## 2019-09-05 NOTE — Progress Notes (Signed)
Patient transported on vent from ED to 7S82 without complications.

## 2019-09-05 NOTE — Sedation Documentation (Signed)
7.5 ET tube successfully placed, 25 at the lip. + color change, equal breath sounds.

## 2019-09-05 NOTE — ED Triage Notes (Signed)
Patient arrived via EMS; from home. C/O respiratory distress. EMS endorsed patient on 1 L [place] at home; initial Sp02 in 35s. Given Albuterol 5 mg PTA and NRB @ 15L; patient Sp02 improved.

## 2019-09-05 NOTE — Progress Notes (Signed)
Critical ABG values given to Dr. Stark Jock.  PH 6.95 PCO2 >97.0 (greater than reportable range) PO2 209 HCO3 - no results.

## 2019-09-05 NOTE — H&P (Signed)
NAME:  Rianne Degraaf, MRN:  [id number], DOB:  [birth year], LOS: 0 ADMISSION DATE:  09/05/2019, CONSULTATION DATE:  09/05/19 REFERRING MD:  Stark Jock- EM, CHIEF COMPLAINT:  Respiratory Distress, AMS  Brief History   64 yo F intubated in ED for AMS with respiratory distress + hypoxia with SpO2 70%.  Post-intubation ABG 6.95/>100/209  History of present illness   64 yo F PMH COPD on home O2, NSCLS s/p partial resection, HFrEF, CAD with MI s/p PCI, Anal cancer s/p colostomy who presents to ED 6/29 via EMS for respiratory distress with hypoxia + AMS. Patient was checked up on by a friend who found patient obtunded with abnormal respirations. EMS was dispatched and noted patient to be hypoxic. Supplemental O2 given. In ED, remained obtained, decision made to intubate. ABG reveals significant acidosis and hypercarbia, with pH 6.95 and pcO2 undetectably high.  Of note, patient recently discharged 08/25/19 after prolonged hospitalization for NSTEMI c/b AECOPD during which she required intubation x 3 (6/5, 6/7, 6/10). Discharged to Rehab, and then discharged home 6/22 with plan for home health.  PCCM   Past Medical History  NSCLS s/p partial resection Anal cancer s/p colostomy CAD with MI s/p PCI COPD on home O2 HFrEF Tobacco abuse  Acute on chronic respiratory failure requiring intubation, repeated   Significant Hospital Events   6/29 admitted to PCCM   Consults:    Procedures:  6/29 ETT>   Significant Diagnostic Tests:  6/29 CXR> ETT, gastric tubes in place. R perihilar density similar to prior. New patchy RLL opacity, small R pleural effusion. L suprahilar density.   Micro Data:  6/29 SARS Cov2> 6/29 tracheal aspirate>  6/29 UCx> 6/29 BCx>  Antimicrobials:  6/29 vanc> 6/29 pip/tazo>   Interim history/subjective:  Intubated in ED  Soft BP after IV push of BZD and IV push of opioid.   Objective   Blood pressure (!) 76/63, pulse (!) 128, temperature 98.1 F (36.7 C), temperature  source Oral, resp. rate (!) 21, height 5\' 9"  (1.753 m), weight 81.6 kg, SpO2 93 %.    Vent Mode: PCV FiO2 (%):  [50 %-100 %] 70 % Set Rate:  [14 bmp-24 bmp] 14 bmp Vt Set:  [450 mL] 450 mL PEEP:  [10 cmH20] 10 cmH20 Plateau Pressure:  [29 cmH20-36 cmH20] 29 cmH20  No intake or output data in the 24 hours ending 09/05/19 0937 Filed Weights   09/05/19 0802  Weight: 81.6 kg    Examination: General: Chronically and critically ill appearing F. Appears older than stated age. Intubated sedated NAD  HENT: NCAT ETT secure OGt secure trachea midline anicteric sclera  Lungs: Diffuse rhonchi bilaterally. Scattered crackles. No wheezes appreciated. Symmetrical chest expansion, mechanically ventilated  Cardiovascular: RRR s1s2. Cap refill < 3 seconds. Non-pitting BLE edema Abdomen: Soft obese. + colostomy  Extremities: No obvious joint deformity. No cyanosis or clubbing. Symmetrical muscle bulk and tone  Neuro: Sedated. Opens eyes to voice, too somnolent to follow commands. PERRL GU: defer   Resolved Hospital Problem list     Assessment & Plan:   Acute Encephalopathy, likely in setting of hypoxia and hypercarbia. Improving with mechanical ventilation -possible infectious process P -check LFTs -follow cx data -abx   Acute on chronic hypoxic and acute hypercarbic respiratory failure Possible PNA on CXR.  -recent hx enterococcus faecalis PNA  Hx COPD on O2.  -Not on home NPPV  -Recent hospitalization requiring MV for AECOPD + Enterococcus faecalis PNA, multiple intubations. Discharged from [facility] 6/22 on albuterol,  breo ellipta, incruse ellipta, duonebs, prednisone taper set to end 6/30 -Globally, concerning that she is readmitted and re-intubated so soon after dc from prolonged admission with 3x ETT  P -Continue MV-- currently on PCV -- Will talk with RRT about current mode/settings, possible changes  -ABG PRN and 1hr after significant vent changes  -AM CXR -tracheal  aspirate -continue broad abx -PCT -check BNP  -Will start short IV steroid course  -BDs  -Will need OP follow up with our office   HFrEF -LVEF 35-40% 08/12/19 CAD s/p PCI P -ASA, Brilinta  -continue diuresis as BP allows   HTN P -holding home meds while sedation equilibrates -ICU monitoring   DM2 -SSI  Hx NSCLC s/p partial resection XLT Hx anal cancer s/p colectomy -Supportive care -WOC for colostomy needs   GERD -Protonix in VAP bundle   Best practice:  Diet: EN per RDN  Pain/Anxiety/Delirium protocol (if indicated): Fent gtt, PRN versed VAP protocol (if indicated): yes DVT prophylaxis: lovenox GI prophylaxis: protonix Glucose control: SSI Mobility: BR Code Status: Full  Family Communication: pending Disposition:  Admit to ICU  Labs   CBC: Recent Labs  Lab 09/05/19 0735 09/05/19 0850  WBC 10.7*  --   NEUTROABS 6.6  --   HGB 10.3* 9.9*  HCT 37.2 29.0*  MCV 93.7  --   PLT 246  --     Basic Metabolic Panel: Recent Labs  Lab 09/05/19 0735 09/05/19 0850  NA 139 142  K 3.9 3.5  CL 102  --   CO2 21*  --   GLUCOSE 320*  --   BUN 15  --   CREATININE 1.09*  --   CALCIUM 9.0  --    GFR: Estimated Creatinine Clearance: 60.4 mL/min (A) (by C-G formula based on SCr of 1.09 mg/dL (H)). Recent Labs  Lab 09/05/19 0735  WBC 10.7*    Liver Function Tests: Recent Labs  Lab 09/05/19 0735  AST 17  ALT 24  ALKPHOS 81  BILITOT 0.6  PROT 6.2*  ALBUMIN 2.5*   No results for input(s): LIPASE, AMYLASE in the last 168 hours. No results for input(s): AMMONIA in the last 168 hours.  ABG    Component Value Date/Time   PHART 7.216 (L) 09/05/2019 0850   PCO2ART 78.7 (HH) 09/05/2019 0850   PO2ART 415 (H) 09/05/2019 0850   HCO3 32.0 (H) 09/05/2019 0850   TCO2 34 (H) 09/05/2019 0850   ACIDBASEDEF 1.0 08/15/2019 1944   O2SAT 100.0 09/05/2019 0850     Coagulation Profile: No results for input(s): INR, PROTIME in the last 168 hours.  Cardiac  Enzymes: No results for input(s): CKTOTAL, CKMB, CKMBINDEX, TROPONINI in the last 168 hours.  HbA1C: Hgb A1c MFr Bld  Date/Time Value Ref Range Status  08/11/2019 08:00 AM 5.8 (H) 4.8 - 5.6 % Final    Comment:    (NOTE) Pre diabetes:          5.7%-6.4% Diabetes:              >6.4% Glycemic control for   <7.0% adults with diabetes   01/10/2019 04:12 AM 5.7 (H) 4.8 - 5.6 % Final    Comment:    (NOTE) Pre diabetes:          5.7%-6.4% Diabetes:              >6.4% Glycemic control for   <7.0% adults with diabetes     CBG: No results for input(s): GLUCAP in the last 168  hours.  Review of Systems:   Unable to obtain, pt intubated and sedated   Past Medical History  She,  has a past medical history of CAD (coronary artery disease) 10/20 DES to pRCA, DES mCx, Cancer of lung ([place]), Colon cancer ([place]), COPD (chronic obstructive pulmonary disease) ([place]), Hyperlipidemia, Hypertension, Ischemic cardiomyopathy, Neuropathy, STEMI (ST elevation myocardial infarction) ([place]) (12/12/2018), and Tobacco abuse.   Surgical History    Past Surgical History:  Procedure Laterality Date  . ABDOMINAL SURGERY     For colon cancer  . COLOSTOMY    . CORONARY STENT INTERVENTION N/A 12/20/2018   Procedure: CORONARY STENT INTERVENTION - Right;  Surgeon: Burnell Blanks, MD;  Location: [place] [facility];  Service: Cardiovascular;  Laterality: N/A;  . CORONARY/GRAFT ACUTE MI REVASCULARIZATION N/A 12/12/2018   Procedure: Coronary/Graft Acute MI Revascularization;  Surgeon: Belva Crome, MD;  Location: [place] [facility];  Service: Cardiovascular;  Laterality: N/A;  . LEFT HEART CATH AND CORONARY ANGIOGRAPHY N/A 12/12/2018   Procedure: LEFT HEART CATH AND CORONARY ANGIOGRAPHY;  Surgeon: Belva Crome, MD;  Location: [place] [facility];  Service: Cardiovascular;  Laterality: N/A;  . LEFT HEART CATH AND CORONARY ANGIOGRAPHY N/A 08/11/2019   Procedure: LEFT HEART CATH AND CORONARY ANGIOGRAPHY;  Surgeon:  Burnell Blanks, MD;  Location: [place] [facility];  Service: Cardiovascular;  Laterality: N/A;     Social History   reports that she has been smoking cigarettes. She has never used smokeless tobacco. She reports that she does not drink alcohol and does not use drugs.   Family History   Her family history includes Hypertension in her father and mother.   Allergies Allergies  Allergen Reactions  . Statins Other (See Comments)  . Hydrochlorothiazide Other (See Comments)    headaches  . Hydrocodone Nausea And Vomiting  . Lisinopril Other (See Comments)    Headache   . Oxycodone Nausea Only     Home Medications  Prior to Admission medications   Medication Sig Start Date End Date Taking? Authorizing Provider  acetaminophen (TYLENOL) 500 MG tablet Take 2 tablets (1,000 mg total) by mouth every 8 (eight) hours as needed for mild pain. 08/29/19   Reed, Tiffany L, DO  albuterol (VENTOLIN HFA) 108 (90 Base) MCG/ACT inhaler Inhale 2 puffs into the lungs every 6 (six) hours as needed for wheezing or shortness of breath. 08/29/19   Reed, Tiffany L, DO  aspirin 81 MG chewable tablet Chew 1 tablet (81 mg total) by mouth daily. 08/29/19   Reed, Tiffany L, DO  BREO ELLIPTA 100-25 MCG/INH AEPB Inhale 1 puff into the lungs daily. 08/29/19   Reed, Tiffany L, DO  Cyanocobalamin (B-12) 1000 MCG CAPS Take 1 capsule by mouth every other day. 08/29/19   Reed, Tiffany L, DO  docusate sodium (COLACE) 100 MG capsule Take 1 capsule (100 mg total) by mouth 2 (two) times daily. 08/29/19   Reed, Tiffany L, DO  feeding supplement, ENSURE ENLIVE, (ENSURE ENLIVE) LIQD Take 237 mLs by mouth 3 (three) times daily between meals. 08/25/19   Terrilee Croak, MD  furosemide (LASIX) 20 MG tablet Take 1 tablet (20 mg total) by mouth daily. 08/29/19   Reed, Tiffany L, DO  guaiFENesin (MUCINEX) 600 MG 12 hr tablet Take 1 tablet (600 mg total) by mouth 2 (two) times daily. 08/29/19   Reed, Tiffany L, DO  ipratropium-albuterol  (DUONEB) 0.5-2.5 (3) MG/3ML SOLN Inhale 3 mLs into the lungs every 6 (six) hours as  needed. 08/29/19   Reed, Tiffany L, DO  losartan (COZAAR) 25 MG tablet Take 0.5 tablets (12.5 mg total) by mouth daily. 08/29/19   Reed, Tiffany L, DO  melatonin 3 MG TABS tablet Take 1 tablet (3 mg total) by mouth at bedtime as needed (insomnia/anxiety). 08/29/19   Reed, Tiffany L, DO  metoprolol succinate (TOPROL-XL) 25 MG 24 hr tablet Take 1 tablet (25 mg total) by mouth daily. 08/29/19   Reed, Tiffany L, DO  Multiple Vitamin (MULTIVITAMIN WITH MINERALS) TABS tablet Take 1 tablet by mouth daily. 08/29/19   Reed, Rexene Edison, DO  NON FORMULARY DIET:Regular NAS Heart Healthy CCD diet 08/25/19   [provider]  pantoprazole (PROTONIX) 40 MG tablet Take 1 tablet (40 mg total) by mouth daily. 08/29/19   Reed, Tiffany L, DO  polyethylene glycol (MIRALAX / GLYCOLAX) 17 g packet Take 17 g by mouth daily as needed for moderate constipation. 08/29/19   Reed, Tiffany L, DO  potassium chloride SA (KLOR-CON) 20 MEQ tablet Take 1 tablet (20 mEq total) by mouth daily. 08/29/19   Reed, Tiffany L, DO  predniSONE (DELTASONE) 10 MG tablet Take 1 tablet daily for 2 days, then stop 08/29/19   Reed, Tiffany L, DO  rosuvastatin (CRESTOR) 40 MG tablet Take 0.5 tablets (20 mg total) by mouth daily. 08/29/19   Reed, Tiffany L, DO  sodium chloride (OCEAN) 0.65 % SOLN nasal spray Place 1 spray into both nostrils as needed for congestion. 08/29/19   Reed, Tiffany L, DO  sucralfate (CARAFATE) 1 g tablet Take 1 tablet (1 g total) by mouth as needed. For stomach discomfort 08/29/19   Reed, Tiffany L, DO  ticagrelor (BRILINTA) 90 MG TABS tablet Take 1 tablet (90 mg total) by mouth 2 (two) times daily. 08/29/19   Reed, Tiffany L, DO  umeclidinium bromide (INCRUSE ELLIPTA) 62.5 MCG/INH AEPB Inhale 1 puff into the lungs daily. 08/29/19   Reed, Tiffany L, DO  Vitamin D, Ergocalciferol, (DRISDOL) 1.25 MG (50000 UNIT) CAPS capsule Take 1 capsule (50,000 Units  total) by mouth every 7 (seven) days. 08/29/19   Gayland Curry, DO     Critical care time: 50 min     CRITICAL CARE Performed by: Cristal Generous   Total critical care time: 50 minutes  Critical care time was exclusive of separately billable procedures and treating other patients.  Critical care was necessary to treat or prevent imminent or life-threatening deterioration.  Critical care was time spent personally by me on the following activities: development of treatment plan with patient and/or surrogate as well as nursing, discussions with consultants, evaluation of patient's response to treatment, examination of patient, obtaining history from patient or surrogate, ordering and performing treatments and interventions, ordering and review of laboratory studies, ordering and review of radiographic studies, pulse oximetry and re-evaluation of patient's condition.  Eliseo Gum MSN, AGACNP-BC [place] [phone] If no answer, [phone] 09/05/2019, 10:46 AM

## 2019-09-05 NOTE — Sedation Documentation (Signed)
16 Fr. OG tubed placed, awaiting XRAY verification. + auscultation.

## 2019-09-05 NOTE — Progress Notes (Signed)
Called to bedside to evaluate patient who is awake despite max fentanyl.  On my arrival patient is awake, calm and appropriately interactive. She is however intermittently gagging with ETT.   Hr 109 BP 107/57 (63) RR 26 (overbreathing set rate) on MV, SpO2 98% on FiO2 70%.   I suspect she has developed tolerance to opioids with recent hospitalization (required repeated intubations// sedation).  -Pt appropriately RASS 0.  -Does not need to be truly sedated more, but would like to achieve greater comfort/less gagging against tube -Will increase fentanyl ceiling dose from 234mcg to 360mcg given likely tolerance. I have also ordered PRN oral lidocaine and chloraseptic spray which may alleviate some of the discomfort from ETT.  -Previously, pt was hypotensive with propofol so do not favor restarting this. Could consider precedex if needed but with borderline BP + seemingly acute heart failure exacerbation, would prefer optimizing fentanyl + spray adjuncts.  -FiO2 reduced from 70% to 50%     Eliseo Gum MSN, AGACNP-BC [place] [phone] If no answer, [phone] 09/05/2019, 3:55 PM

## 2019-09-05 NOTE — Progress Notes (Signed)
Critical ABG values given to Dr. Stark Jock.  Results for Chelsea Allison, Chelsea Allison (MRN [id number]) as of 09/05/2019 08:55  Ref. Range 09/05/2019 08:50  Sample type Unknown ARTERIAL  pH, Arterial Latest Ref Range: 7.35 - 7.45  7.216 (L)  pCO2 arterial Latest Ref Range: 32 - 48 mmHg 78.7 (HH)  pO2, Arterial Latest Ref Range: 83 - 108 mmHg 415 (H)  TCO2 Latest Ref Range: 22 - 32 mmol/L 34 (H)  Acid-Base Excess Latest Ref Range: 0.0 - 2.0 mmol/L 3.0 (H)  Bicarbonate Latest Ref Range: 20.0 - 28.0 mmol/L 32.0 (H)  O2 Saturation Latest Units: % 100.0  Patient temperature Unknown 98.1 F  Collection site Unknown Radial

## 2019-09-05 NOTE — Progress Notes (Signed)
Visited with patient. Patient is on vent. Spoke with patient caregiver and got doctor to speak with family.  Provided support to family and staff.  Will follow as needed.  Jaclynn Major, [place], [facility], Pager [phone]

## 2019-09-05 NOTE — Progress Notes (Signed)
Pharmacy Antibiotic Note  Chelsea Allison is a 63 y.o. female admitted on 09/05/2019 with pneumonia.  Pharmacy has been consulted for Vanc and Zosyn dosing. CrCl 60 ml/min.  Plan: Vanc 1000 mg IV q12hr Zosyn 3.375 gm IV q8hr (over 4 hours) Monitor renal function, clinical status, C&S and vanc levels as needed  Height: 5\' 9"  (175.3 cm) Weight: 81.6 kg (180 lb) IBW/kg (Calculated) : 66.2  Temp (24hrs), Avg:98.1 F (36.7 C), Min:98.1 F (36.7 C), Max:98.1 F (36.7 C)  Recent Labs  Lab 09/05/19 0735 09/05/19 0905 09/05/19 0955  WBC 10.7*  --   --   CREATININE 1.09*  --   --   LATICACIDVEN  --  1.6 1.9    Estimated Creatinine Clearance: 60.4 mL/min (A) (by C-G formula based on SCr of 1.09 mg/dL (H)).    Allergies  Allergen Reactions  . Statins Other (See Comments)  . Hydrochlorothiazide Other (See Comments)    headaches  . Hydrocodone Nausea And Vomiting  . Lisinopril Other (See Comments)    Headache   . Oxycodone Nausea Only    Antimicrobials this admission: Vanc 6/29 >>  Zosyn 6/29 >>   Thank you for allowing pharmacy to be a part of this patient's care.  Alanda Slim, PharmD, [facility] Clinical Pharmacist Please see AMION for all Pharmacists' Contact Phone Numbers 09/05/2019, 10:50 AM

## 2019-09-06 LAB — BASIC METABOLIC PANEL
Anion gap: 10 (ref 5–15)
Anion gap: 9 (ref 5–15)
BUN: 21 mg/dL (ref 8–23)
BUN: 21 mg/dL (ref 8–23)
CO2: 32 mmol/L (ref 22–32)
CO2: 32 mmol/L (ref 22–32)
Calcium: 8.5 mg/dL — ABNORMAL LOW (ref 8.9–10.3)
Calcium: 8.9 mg/dL (ref 8.9–10.3)
Chloride: 102 mmol/L (ref 98–111)
Chloride: 102 mmol/L (ref 98–111)
Creatinine, Ser: 1.18 mg/dL — ABNORMAL HIGH (ref 0.44–1.00)
Creatinine, Ser: 1.23 mg/dL — ABNORMAL HIGH (ref 0.44–1.00)
GFR calc Af Amer: 57 mL/min — ABNORMAL LOW (ref >60)
GFR calc Af Amer: 54 mL/min — ABNORMAL LOW (ref >60)
GFR calc non Af Amer: 49 mL/min — ABNORMAL LOW (ref >60)
GFR calc non Af Amer: 47 mL/min — ABNORMAL LOW (ref >60)
Glucose, Bld: 119 mg/dL — ABNORMAL HIGH (ref 70–99)
Glucose, Bld: 139 mg/dL — ABNORMAL HIGH (ref 70–99)
Potassium: 3.2 mmol/L — ABNORMAL LOW (ref 3.5–5.1)
Potassium: 3.1 mmol/L — ABNORMAL LOW (ref 3.5–5.1)
Sodium: 144 mmol/L (ref 135–145)
Sodium: 143 mmol/L (ref 135–145)

## 2019-09-06 LAB — POCT I-STAT 7, (LYTES, BLD GAS, ICA,H+H)
Acid-Base Excess: 10 mmol/L — ABNORMAL HIGH (ref 0.0–2.0)
Bicarbonate: 33.7 mmol/L — ABNORMAL HIGH (ref 20.0–28.0)
Calcium, Ion: 1.15 mmol/L (ref 1.15–1.40)
HCT: 38 % (ref 36.0–46.0)
Hemoglobin: 12.9 g/dL (ref 12.0–15.0)
O2 Saturation: 100 %
Patient temperature: 98.6
Potassium: 2.9 mmol/L — ABNORMAL LOW (ref 3.5–5.1)
Sodium: 141 mmol/L (ref 135–145)
TCO2: 35 mmol/L — ABNORMAL HIGH (ref 22–32)
pCO2 arterial: 41.1 mmHg (ref 32.0–48.0)
pH, Arterial: 7.522 — ABNORMAL HIGH (ref 7.350–7.450)
pO2, Arterial: 160 mmHg — ABNORMAL HIGH (ref 83.0–108.0)

## 2019-09-06 LAB — PHOSPHORUS
Phosphorus: 3 mg/dL (ref 2.5–4.6)
Phosphorus: 3.4 mg/dL (ref 2.5–4.6)

## 2019-09-06 LAB — TROPONIN I (HIGH SENSITIVITY)
Troponin I (High Sensitivity): 321 ng/L (ref <18)
Troponin I (High Sensitivity): 446 ng/L (ref <18)

## 2019-09-06 LAB — GLUCOSE, CAPILLARY
Glucose-Capillary: 114 mg/dL — ABNORMAL HIGH (ref 70–99)
Glucose-Capillary: 118 mg/dL — ABNORMAL HIGH (ref 70–99)
Glucose-Capillary: 106 mg/dL — ABNORMAL HIGH (ref 70–99)
Glucose-Capillary: 139 mg/dL — ABNORMAL HIGH (ref 70–99)
Glucose-Capillary: 107 mg/dL — ABNORMAL HIGH (ref 70–99)
Glucose-Capillary: 132 mg/dL — ABNORMAL HIGH (ref 70–99)

## 2019-09-06 LAB — PROCALCITONIN: Procalcitonin: 9.19 ng/mL

## 2019-09-06 LAB — MAGNESIUM
Magnesium: 1.6 mg/dL — ABNORMAL LOW (ref 1.7–2.4)
Magnesium: 2.5 mg/dL — ABNORMAL HIGH (ref 1.7–2.4)

## 2019-09-06 MED ORDER — BUPRENORPHINE 20 MCG/HR TD PTWK: 1.0000 | MEDICATED_PATCH | TRANSDERMAL | Status: DC

## 2019-09-06 MED ORDER — MAGNESIUM SULFATE 2 GM/50ML IV SOLN
2.0000 g | Freq: Once | INTRAVENOUS | Status: AC
  Administered 2019-09-06: 2 g via INTRAVENOUS
  Filled 2019-09-06: qty 50

## 2019-09-06 MED ORDER — FLUCONAZOLE 150 MG PO TABS
150.0000 mg | ORAL_TABLET | Freq: Once | ORAL | Status: AC
  Administered 2019-09-06: 150 mg
  Filled 2019-09-06: qty 1

## 2019-09-06 MED ORDER — BUPRENORPHINE 10 MCG/HR TD PTWK
1.0000 | MEDICATED_PATCH | TRANSDERMAL | Status: DC
  Administered 2019-09-07 – 2019-09-21 (×3): 1 via TRANSDERMAL
  Filled 2019-09-06 (×8): qty 1

## 2019-09-06 MED ORDER — BUPRENORPHINE 10 MCG/HR TD PTWK
1.0000 | MEDICATED_PATCH | TRANSDERMAL | Status: DC
  Administered 2019-09-07 – 2019-09-21 (×2): 1 via TRANSDERMAL
  Filled 2019-09-06 (×6): qty 1

## 2019-09-06 MED ORDER — POTASSIUM CHLORIDE 20 MEQ/15ML (10%) PO SOLN
40.0000 meq | Freq: Once | ORAL | Status: AC
  Administered 2019-09-06: 40 meq
  Filled 2019-09-06: qty 30

## 2019-09-06 MED ORDER — ENOXAPARIN SODIUM 40 MG/0.4ML ~~LOC~~ SOLN
40.0000 mg | SUBCUTANEOUS | Status: DC
  Administered 2019-09-06 – 2019-09-11 (×6): 40 mg via SUBCUTANEOUS
  Filled 2019-09-06 (×6): qty 0.4

## 2019-09-06 NOTE — Progress Notes (Signed)
Dr. Halford Chessman dropped pt's PEEP from 8 to a PEEP of 5. Pt now on PS/CPAP 10/5

## 2019-09-06 NOTE — Progress Notes (Signed)
Patient vaginal  Area is red, painful with a white discharge. She states (written, due to vent) that she had an appointment with PCP today for her symptoms--states she has been on multiple antibiotics recently and suspects either a UTI and/or yeast infection. Will report to oncoming RN to discuss with CCM rounding this am. Patient has been fully alert, and calm overnight on vent with no distress.

## 2019-09-06 NOTE — Progress Notes (Signed)
NAME:  Chelsea Allison, MRN:  [id number], DOB:  May 11, 1955, LOS: 1 ADMISSION DATE:  09/05/2019, CONSULTATION DATE:  09/05/19 REFERRING MD:  Stark Jock- EM, CHIEF COMPLAINT:  Respiratory Distress, AMS  Brief History   64 yo female smoker presented to ED with altered mental status from acute on chronic hypoxic/hypercapnic respiratory failure 2nd to HCAP in setting of COPD.  History of present illness   64 yo F PMH COPD on home O2, NSCLS s/p partial resection, HFrEF, CAD with MI s/p PCI, Anal cancer s/p colostomy who presents to ED 6/29 via EMS for respiratory distress with hypoxia + AMS. Patient was checked up on by a friend who found patient obtunded with abnormal respirations. EMS was dispatched and noted patient to be hypoxic. Supplemental O2 given. In ED, remained obtained, decision made to intubate. ABG reveals significant acidosis and hypercarbia, with pH 6.95 and pcO2 undetectably high.  Of note, patient recently discharged 08/25/19 after prolonged hospitalization for NSTEMI c/b AECOPD during which she required intubation x 3 (6/5, 6/7, 6/10). Discharged to Rehab, and then discharged home 6/22 with plan for home health.  Past Medical History  NSCLS s/p partial resection Anal cancer s/p colostomy CAD with MI s/p PCI COPD on home O2 HFrEF Tobacco abuse  Acute on chronic respiratory failure requiring intubation, repeated   Significant Hospital Events   6/29 admitted to PCCM   Consults:    Procedures:  6/29 ETT>   Significant Diagnostic Tests:    Micro Data:  6/29 SARS Cov2>negative 6/29 tracheal aspirate>  6/29 UCx> 6/29 BCx> 6/29 MRSA PCR>negative  Antimicrobials:  6/29 vancomycin>6/30 6/29 pip/tazo>   Interim history/subjective:  C/o soreness in Lt upper chest and perineal area.  Noted to have discharge from perineal area.   Objective   Blood pressure (!) 92/51, pulse 88, temperature 98.6 F (37 C), temperature source Oral, resp. rate 16, height 5\' 9"  (1.753 m), weight 82.5  kg, SpO2 100 %.    Vent Mode: PRVC FiO2 (%):  [40 %-100 %] 40 % Set Rate:  [14 bmp-24 bmp] 18 bmp Vt Set:  [450 mL-500 mL] 500 mL PEEP:  [8 cmH20-10 cmH20] 8 cmH20 Plateau Pressure:  [19 cmH20-36 cmH20] 20 cmH20   Intake/Output Summary (Last 24 hours) at 09/06/2019 0732 Last data filed at 09/06/2019 0600 Gross per 24 hour  Intake 1854.03 ml  Output 2200 ml  Net -345.97 ml   Filed Weights   09/05/19 0802 09/06/19 0500  Weight: 81.6 kg 82.5 kg    Examination:  General - alert Eyes - pupils reactive ENT - ETT in place Cardiac - regular rate/rhythm, no murmur Chest - decreased BS, better air movement, no wheeze Abdomen - soft, non tender, + bowel sounds Extremities - no cyanosis, clubbing, or edema Skin - no rashes Neuro - RASS 0  Resolved Hospital Problem list     Assessment & Plan:   Acute on chronic hypoxic/hypercapnic respiratory failure from HCAP. COPD without exacerbation. Tobacco abuse. - pressure support wean - day 2 of Abx; d/c vancomycin - goal SpO2 88 to 95% - f/u CXR  Acute metabolic encephalopathy 2nd to hypoxia/hypercapnia. - RASS goal 0  Hx of CAD, chronic systolic CHF, HLD. - continue ASA, brilinta, crestor - hold outpt lasix, cozaar, toprol for now  DM type 2 with steroid induced hyperglycemia. - SSI  Hx NSCLC s/p partial resection XLT. Hx anal cancer s/p colectomy. Chronic cancer pain. - resume buprenorphine patch - WOC for colostomy needs   GERD. - continue protonix  Hypokalemia. - f/u BMET  Vaginal candidiasis. - diflucan 150 mg x one dose  Best practice:  Diet: tube feeds DVT prophylaxis: lovenox GI prophylaxis: protonix Mobility: bed rest Code Status: Full  Disposition:  ICU  Labs    CMP Latest Ref Rng & Units 09/06/2019 09/05/2019 09/05/2019  Glucose 70 - 99 mg/dL - - 320(H)  BUN 8 - 23 mg/dL - - 15  Creatinine 0.44 - 1.00 mg/dL - - 1.09(H)  Sodium 135 - 145 mmol/L 141 142 139  Potassium 3.5 - 5.1 mmol/L 2.9(L) 3.5  3.9  Chloride 98 - 111 mmol/L - - 102  CO2 22 - 32 mmol/L - - 21(L)  Calcium 8.9 - 10.3 mg/dL - - 9.0  Total Protein 6.5 - 8.1 g/dL - - 6.2(L)  Total Bilirubin 0.3 - 1.2 mg/dL - - 0.6  Alkaline Phos 38 - 126 U/L - - 81  AST 15 - 41 U/L - - 17  ALT 0 - 44 U/L - - 24    CBC Latest Ref Rng & Units 09/06/2019 09/05/2019 09/05/2019  WBC 4.0 - 10.5 K/uL - 6.5 -  Hemoglobin 12.0 - 15.0 g/dL 12.9 9.2(L) 9.9(L)  Hematocrit 36 - 46 % 38.0 32.8(L) 29.0(L)  Platelets 150 - 400 K/uL - 155 -    ABG    Component Value Date/Time   PHART 7.522 (H) 09/06/2019 0344   PCO2ART 41.1 09/06/2019 0344   PO2ART 160 (H) 09/06/2019 0344   HCO3 33.7 (H) 09/06/2019 0344   TCO2 35 (H) 09/06/2019 0344   ACIDBASEDEF 1.0 08/15/2019 1944   O2SAT 100.0 09/06/2019 0344    CBG (last 3)  Recent Labs    09/05/19 1938 09/05/19 2343 09/06/19 0322  GLUCAP 105* 105* 114*     Critical care time: 34 minutes  Chesley Mires, MD [place] Pager - [phone] 09/06/2019, 7:46 AM

## 2019-09-07 ENCOUNTER — Inpatient Hospital Stay (HOSPITAL_COMMUNITY): Payer: Medicare HMO

## 2019-09-07 LAB — GLUCOSE, CAPILLARY
Glucose-Capillary: 102 mg/dL — ABNORMAL HIGH (ref 70–99)
Glucose-Capillary: 107 mg/dL — ABNORMAL HIGH (ref 70–99)
Glucose-Capillary: 163 mg/dL — ABNORMAL HIGH (ref 70–99)
Glucose-Capillary: 120 mg/dL — ABNORMAL HIGH (ref 70–99)
Glucose-Capillary: 124 mg/dL — ABNORMAL HIGH (ref 70–99)
Glucose-Capillary: 123 mg/dL — ABNORMAL HIGH (ref 70–99)

## 2019-09-07 LAB — CBC
HCT: 27.3 % — ABNORMAL LOW (ref 36.0–46.0)
Hemoglobin: 7.7 g/dL — ABNORMAL LOW (ref 12.0–15.0)
MCH: 25.4 pg — ABNORMAL LOW (ref 26.0–34.0)
MCHC: 28.2 g/dL — ABNORMAL LOW (ref 30.0–36.0)
MCV: 90.1 fL (ref 80.0–100.0)
Platelets: 186 10*3/uL (ref 150–400)
RBC: 3.03 MIL/uL — ABNORMAL LOW (ref 3.87–5.11)
RDW: 17.6 % — ABNORMAL HIGH (ref 11.5–15.5)
WBC: 5.8 10*3/uL (ref 4.0–10.5)
nRBC: 0 % (ref 0.0–0.2)

## 2019-09-07 LAB — URINE CULTURE: Culture: <10000 — AB

## 2019-09-07 LAB — BASIC METABOLIC PANEL
Anion gap: 8 (ref 5–15)
BUN: 25 mg/dL — ABNORMAL HIGH (ref 8–23)
CO2: 32 mmol/L (ref 22–32)
Calcium: 9 mg/dL (ref 8.9–10.3)
Chloride: 104 mmol/L (ref 98–111)
Creatinine, Ser: 1.11 mg/dL — ABNORMAL HIGH (ref 0.44–1.00)
GFR calc Af Amer: >60 mL/min (ref >60)
GFR calc non Af Amer: 53 mL/min — ABNORMAL LOW (ref >60)
Glucose, Bld: 124 mg/dL — ABNORMAL HIGH (ref 70–99)
Potassium: 3.8 mmol/L (ref 3.5–5.1)
Sodium: 144 mmol/L (ref 135–145)

## 2019-09-07 LAB — PROCALCITONIN: Procalcitonin: 5.81 ng/mL

## 2019-09-07 LAB — CALCIUM, IONIZED: Calcium, Ionized, Serum: 5.1 mg/dL (ref 4.5–5.6)

## 2019-09-07 MED ORDER — VITAL 1.5 CAL PO LIQD
1000.0000 mL | ORAL | Status: DC
  Administered 2019-09-07 – 2019-10-03 (×29): 1000 mL
  Filled 2019-09-07 (×43): qty 1000

## 2019-09-07 MED ORDER — ZINC SULFATE 220 (50 ZN) MG PO CAPS
220.0000 mg | ORAL_CAPSULE | Freq: Every day | ORAL | Status: DC
  Administered 2019-09-07 – 2019-10-04 (×27): 220 mg
  Filled 2019-09-07 (×27): qty 1

## 2019-09-07 MED ORDER — ASCORBIC ACID 500 MG PO TABS
250.0000 mg | ORAL_TABLET | Freq: Two times a day (BID) | ORAL | Status: DC
  Administered 2019-09-07 – 2019-10-04 (×54): 250 mg
  Filled 2019-09-07 (×55): qty 1

## 2019-09-07 MED ORDER — PRO-STAT SUGAR FREE PO LIQD
30.0000 mL | Freq: Every day | ORAL | Status: DC
  Administered 2019-09-08 – 2019-09-21 (×13): 30 mL
  Filled 2019-09-07 (×13): qty 30

## 2019-09-07 NOTE — Consult Note (Signed)
WOC Nurse Consult Note: Reason for Consult: Deep Tissue Pressure Injury; chronic area per patient after bowel resection surgery Wound type: Deep Tissue Pressure Injury Pressure Injury POA: Yes Measurement: 1.5cm x 1.5cm x 0.2cm  Wound bed:80% deep tissue dark purple tissue, non blanchable/20% pink/pale Drainage (amount, consistency, odor) minimal; serosanguineous  Periwound: intact  Dressing procedure/placement/frequency: Continue silicone foam dressing; lift to inspect each shift for acute changes. Change every 3 days.   Discussed POC with patient and bedside nurse.  Re consult if needed, will not follow at this time. Thanks  Terrence Wishon [employer], RN,CWOCN, CNS, [place] [phone])

## 2019-09-07 NOTE — Progress Notes (Addendum)
NAME:  Chelsea Allison, MRN:  [id number], DOB:  September 11, 1955, LOS: 2 ADMISSION DATE:  09/05/2019, CONSULTATION DATE:  09/05/19 REFERRING MD:  Stark Jock- EM, CHIEF COMPLAINT:  Respiratory Distress, AMS  Brief History   64 yo female smoker presented to ED with altered mental status from acute on chronic hypoxic/hypercapnic respiratory failure 2nd to HCAP in setting of COPD.  History of present illness   64 yo F PMH COPD on home O2, NSCLS s/p partial resection, HFrEF, CAD with MI s/p PCI, Anal cancer s/p colostomy who presents to ED 6/29 via EMS for respiratory distress with hypoxia + AMS. Patient was checked up on by a friend who found patient obtunded with abnormal respirations. EMS was dispatched and noted patient to be hypoxic. Supplemental O2 given. In ED, remained obtained, decision made to intubate. ABG reveals significant acidosis and hypercarbia, with pH 6.95 and pcO2 undetectably high.  Of note, patient recently discharged 08/25/19 after prolonged hospitalization for NSTEMI c/b AECOPD during which she required intubation x 3 (6/5, 6/7, 6/10). Discharged to Rehab, and then discharged home 6/22 with plan for home health.  Past Medical History  NSCLS s/p partial resection Anal cancer s/p colostomy CAD with MI s/p PCI COPD on home O2 HFrEF Tobacco abuse  Acute on chronic respiratory failure requiring intubation, repeated   [place] [facility] Events   6/29 admitted to PCCM     Procedures:  6/29 ETT>   Significant Diagnostic Tests:    Micro Data:  6/29 SARS Cov2>negative 6/29 tracheal aspirate>  6/29 UCx> less than 10,000 colonies insignificant growth 6/29 BCx> 6/29 MRSA PCR>negative  Antimicrobials:  6/29 vancomycin>6/30 6/29 pip/tazo>   Interim history/subjective:  Discussion concerning possible tracheostomy future.  She wants to try for elective extubation and if fails will consider tracheostomy.  Talk  Objective   Blood pressure (!) 82/46, pulse 83, temperature (!) 97.2 F  (36.2 C), temperature source Axillary, resp. rate 18, height '5\' 9"'  (1.753 m), weight 85 kg, SpO2 97 %.    Vent Mode: PRVC FiO2 (%):  [40 %-50 %] 50 % Set Rate:  [18 bmp] 18 bmp Vt Set:  [500 mL] 500 mL PEEP:  [5 cmH20] 5 cmH20 Pressure Support:  [10 cmH20] 10 cmH20 Plateau Pressure:  [22 cmH20-25 cmH20] 22 cmH20   Intake/Output Summary (Last 24 hours) at 09/07/2019 0816 Last data filed at 09/07/2019 0600 Gross per 24 hour  Intake 1677.59 ml  Output 450 ml  Net 1227.59 ml   Filed Weights   09/05/19 0802 09/06/19 0500 09/07/19 0500  Weight: 81.6 kg 82.5 kg 85 kg    Examination:  General: Awake alert no acute distress able to write concerns and questions HEENT: Endotracheal tube and orogastric tube are in place no JVD is appreciated Neuro: Grossly intact no focal defects able to communicate CV: Heart sounds are regular regular rate and rhythm PULM: Coarse rhonchi bilaterally Vent currently on pressure regulated volume control FIO2 40% PEEP 5  RATE 18+2 VT 500  GI: soft, bsx4 active motion GU: Amber urine Extremities: Positive edema Skin: no rashes or lesions   Resolved Hospital Problem list     Assessment & Plan:   Acute on chronic hypoxic/hypercapnic respiratory failure from HCAP. COPD without exacerbation. Tobacco abuse. 09/07/2019 attempt to wean and extubate Day 3 of antibiotics vancomycin has been discontinued continue Zosyn for total of 5 days O2 sat goal is greater than 88% Chest x-ray reviewed 09/07/2019 no significant change  Acute metabolic encephalopathy 2nd to hypoxia/hypercapnia. RASS goal of  0 She is awake and alert no longer having metabolic encephalopathy  Hx of CAD, chronic systolic CHF, HLD. Continue aspirin and Brilinta and Crestor Currently holding Lasix Cozaar and Toprol for now  DM type 2 with steroid induced hyperglycemia. CBG (last 3)  Recent Labs    09/06/19 2321 09/07/19 0318 09/07/19 0719  GLUCAP 132* 102* 107*    Sliding-scale  insulin protocol  Hx NSCLC s/p partial resection XLT. Hx anal cancer s/p colectomy. Chronic cancer pain. Continue buprenorphine patch Ostomy care  GERD. Continue Protonix  Hypokalemia. Recent Labs  Lab 09/06/19 0715 09/06/19 1620 09/07/19 0652  K 3.2* 3.1* 3.8    Serial be met  Vaginal candidiasis. Completed 1 dose of Diflucan  Best practice:  Diet: tube feeds DVT prophylaxis: lovenox GI prophylaxis: protonix Mobility: bed rest Code Status: Full  Disposition:  ICU Family update: 09/07/2019 patient updated at bedside.  Long discussion concerning possible tracheostomy in future should she fail this extubation.  Labs    CMP Latest Ref Rng & Units 09/07/2019 09/06/2019 09/06/2019  Glucose 70 - 99 mg/dL 124(H) 139(H) 119(H)  BUN 8 - 23 mg/dL 25(H) 21 21  Creatinine 0.44 - 1.00 mg/dL 1.11(H) 1.23(H) 1.18(H)  Sodium 135 - 145 mmol/L 144 143 144  Potassium 3.5 - 5.1 mmol/L 3.8 3.1(L) 3.2(L)  Chloride 98 - 111 mmol/L 104 102 102  CO2 22 - 32 mmol/L 32 32 32  Calcium 8.9 - 10.3 mg/dL 9.0 8.9 8.5(L)  Total Protein 6.5 - 8.1 g/dL - - -  Total Bilirubin 0.3 - 1.2 mg/dL - - -  Alkaline Phos 38 - 126 U/L - - -  AST 15 - 41 U/L - - -  ALT 0 - 44 U/L - - -    CBC Latest Ref Rng & Units 09/07/2019 09/06/2019 09/05/2019  WBC 4.0 - 10.5 K/uL 5.8 - 6.5  Hemoglobin 12.0 - 15.0 g/dL 7.7(L) 12.9 9.2(L)  Hematocrit 36 - 46 % 27.3(L) 38.0 32.8(L)  Platelets 150 - 400 K/uL 186 - 155    ABG    Component Value Date/Time   PHART 7.522 (H) 09/06/2019 0344   PCO2ART 41.1 09/06/2019 0344   PO2ART 160 (H) 09/06/2019 0344   HCO3 33.7 (H) 09/06/2019 0344   TCO2 35 (H) 09/06/2019 0344   ACIDBASEDEF 1.0 08/15/2019 1944   O2SAT 100.0 09/06/2019 0344    CBG (last 3)  Recent Labs    09/06/19 2321 09/07/19 0318 09/07/19 0719  GLUCAP 132* 102* 107*     App 30 min   Richardson Landry Daneil Beem ACNP Acute Care Nurse Practitioner [place] Please consult Amion 09/07/2019, 8:45  AM

## 2019-09-07 NOTE — Progress Notes (Signed)
Initial Nutrition Assessment  DOCUMENTATION CODES:   Not applicable  INTERVENTION:   Tube Feeding via OG tube:  Vital 1.5 at 60 ml/hr Pro-Stat 30 mL daily Provides 112 g of protein, 2260 kcals and 1094 mL of free water  Add Vitamin C 250 mg BID and Zinc Sulfate 220 mg x 30 days for wound healing   NUTRITION DIAGNOSIS:   Inadequate oral intake related to acute illness as evidenced by NPO status.  GOAL:   Patient will meet greater than or equal to 90% of their needs  MONITOR:   Vent status, Diet advancement, Labs, Weight trends, Skin  REASON FOR ASSESSMENT:   Consult, Ventilator Enteral/tube feeding initiation and management  ASSESSMENT:   63 yo female admitted with acute on chronic respiratory failure from HCAP requiring intubation PMH includes NSCLS s/p partial resection, anal cancer s/p colostomy, acute on chronic respiratory failure requiring repeat intubations, CHF, COPD, CAD, MI  6/29 Admitted, Intubated  Pt alert on the vent and able to communicate using a pen and paper. Pt reports she had been eating well between this hospitalization and previous recent hospitalization with no weight loss  Current weight 85 kg; admit weight 82 kg. Net + 1 L  Pt with stage II/III on sacrum, wound care consulted today  Pt with colostomy with no issues per pt.   OG tube in place with Vital High Protein at 40 ml/hrinfusing  and Pro-Stat 30 mL BID, tolerating well  Labs: reviewed Meds: ss novolog, B-12, Vit D, miralax   Diet Order:   Diet Order            Diet NPO time specified  Diet effective now                 EDUCATION NEEDS:   Not appropriate for education at this time  Skin:  Skin Assessment: Skin Integrity Issues: Skin Integrity Issues:: Stage III Stage III: sacrum (wound care consulted today)  Last BM:  7/1 via colosotmy  Height:   Ht Readings from Last 1 Encounters:  09/05/19 5\' 9"  (1.753 m)    Weight:   Wt Readings from Last 1 Encounters:   09/07/19 85 kg   BMI:  Body mass index is 27.67 kg/m.  Estimated Nutritional Needs:   Kcal:  2000-2300 kcals  Protein:  100-120 g  Fluid:  >/= 2 L   Kerman Passey MS, RDN, LDN, CNSC Registered Dietitian III RD Pager Number and RD On-Call Pager Number Located in [place]

## 2019-09-08 ENCOUNTER — Inpatient Hospital Stay (HOSPITAL_COMMUNITY): Payer: Medicare HMO

## 2019-09-08 LAB — CBC WITH DIFFERENTIAL/PLATELET
Abs Immature Granulocytes: 0.03 10*3/uL (ref 0.00–0.07)
Basophils Absolute: 0 10*3/uL (ref 0.0–0.1)
Basophils Relative: 0 %
Eosinophils Absolute: 0.1 10*3/uL (ref 0.0–0.5)
Eosinophils Relative: 2 %
HCT: 26.1 % — ABNORMAL LOW (ref 36.0–46.0)
Hemoglobin: 7.5 g/dL — ABNORMAL LOW (ref 12.0–15.0)
Immature Granulocytes: 1 %
Lymphocytes Relative: 12 %
Lymphs Abs: 0.6 10*3/uL — ABNORMAL LOW (ref 0.7–4.0)
MCH: 26 pg (ref 26.0–34.0)
MCHC: 28.7 g/dL — ABNORMAL LOW (ref 30.0–36.0)
MCV: 90.6 fL (ref 80.0–100.0)
Monocytes Absolute: 0.5 10*3/uL (ref 0.1–1.0)
Monocytes Relative: 9 %
Neutro Abs: 3.9 10*3/uL (ref 1.7–7.7)
Neutrophils Relative %: 76 %
Platelets: 191 10*3/uL (ref 150–400)
RBC: 2.88 MIL/uL — ABNORMAL LOW (ref 3.87–5.11)
RDW: 17.7 % — ABNORMAL HIGH (ref 11.5–15.5)
WBC: 5.2 10*3/uL (ref 4.0–10.5)
nRBC: 0 % (ref 0.0–0.2)

## 2019-09-08 LAB — GLUCOSE, CAPILLARY
Glucose-Capillary: 100 mg/dL — ABNORMAL HIGH (ref 70–99)
Glucose-Capillary: 132 mg/dL — ABNORMAL HIGH (ref 70–99)
Glucose-Capillary: 147 mg/dL — ABNORMAL HIGH (ref 70–99)
Glucose-Capillary: 153 mg/dL — ABNORMAL HIGH (ref 70–99)
Glucose-Capillary: 158 mg/dL — ABNORMAL HIGH (ref 70–99)
Glucose-Capillary: 88 mg/dL (ref 70–99)

## 2019-09-08 LAB — BASIC METABOLIC PANEL
Anion gap: 10 (ref 5–15)
BUN: 30 mg/dL — ABNORMAL HIGH (ref 8–23)
CO2: 30 mmol/L (ref 22–32)
Calcium: 9 mg/dL (ref 8.9–10.3)
Chloride: 104 mmol/L (ref 98–111)
Creatinine, Ser: 0.97 mg/dL (ref 0.44–1.00)
GFR calc Af Amer: >60 mL/min (ref >60)
GFR calc non Af Amer: >60 mL/min (ref >60)
Glucose, Bld: 118 mg/dL — ABNORMAL HIGH (ref 70–99)
Potassium: 3.7 mmol/L (ref 3.5–5.1)
Sodium: 144 mmol/L (ref 135–145)

## 2019-09-08 LAB — PHOSPHORUS: Phosphorus: 3.3 mg/dL (ref 2.5–4.6)

## 2019-09-08 LAB — MAGNESIUM: Magnesium: 2.6 mg/dL — ABNORMAL HIGH (ref 1.7–2.4)

## 2019-09-08 MED ORDER — CHLORHEXIDINE GLUCONATE CLOTH 2 % EX PADS
6.0000 | MEDICATED_PAD | Freq: Every day | CUTANEOUS | Status: DC
  Administered 2019-09-08 – 2019-10-04 (×25): 6 via TOPICAL

## 2019-09-08 MED ORDER — FENTANYL BOLUS VIA INFUSION
50.0000 ug | INTRAVENOUS | Status: DC | PRN
  Administered 2019-09-08 – 2019-09-13 (×39): 50 ug via INTRAVENOUS
  Filled 2019-09-08: qty 50

## 2019-09-08 NOTE — Progress Notes (Addendum)
NAME:  Chelsea Allison, MRN:  [id number], DOB:  07-11-1955, LOS: 3 ADMISSION DATE:  09/05/2019, CONSULTATION DATE:  09/05/19 REFERRING MD:  Stark Jock- EM, CHIEF COMPLAINT:  Respiratory Distress, AMS  Brief History   64 yo female smoker presented to ED with altered mental status from acute on chronic hypoxic/hypercapnic respiratory failure 2nd to HCAP in setting of COPD.  Past Medical History  NSCLS s/p partial resection Anal cancer s/p colostomy CAD with MI s/p PCI COPD on home O2 HFrEF Tobacco abuse  Acute on chronic respiratory failure requiring intubation, repeated   Significant Hospital Events   6/29 admitted to PCCM   Procedures:  6/29 ETT>   Significant Diagnostic Tests:  CXR 7/2: appropriate positioning of ETT, NG tubes, no acute overlying lung processes noted   Micro Data:  6/29 SARS Cov2>negative 6/29 tracheal aspirate>  6/29 UCx> less than 10,000 colonies insignificant growth 6/29 BCx> 6/29 MRSA PCR>negative  Antimicrobials:  6/29 vancomycin>6/30 6/29 pip/tazo> 7/3  Interim history/subjective:  Discussion concerning possible tracheostomy future.  She wants to try for elective extubation and if fails will consider tracheostomy.  Talk  Objective   Blood pressure 117/68, pulse (!) 113, temperature 98.8 F (37.1 C), temperature source Oral, resp. rate (!) 21, height _0  (1.753 m), weight 86.9 kg, SpO2 99 %.    Vent Mode: PSV;CPAP FiO2 (%):  [40 %] 40 % Set Rate:  [18 bmp] 18 bmp Vt Set:  [500 mL-520 mL] 520 mL PEEP:  [5 cmH20] 5 cmH20 Pressure Support:  [10 cmH20] 10 cmH20 Plateau Pressure:  [9 cmH20-31 cmH20] 9 cmH20   Intake/Output Summary (Last 24 hours) at 09/08/2019 1118 Last data filed at 09/08/2019 7672 Gross per 24 hour  Intake 1906.43 ml  Output 200 ml  Net 1706.43 ml   Filed Weights   09/06/19 0500 09/07/19 0500 09/08/19 0500  Weight: 82.5 kg 85 kg 86.9 kg    Examination:  General: elderly female, calm sitting upright in bed in NAD,  intermittently clearing secretions HEENT: endotracheal tube in place  Neuro:alert and communicating non-verbally  CV: RRR without murmur  PULM: rhonchi with inspiration, occasional wheezing on right lung field,  Vent currently on pressure support with CPAP   FIO2 40%  PEEP 5   RATE 18  VT 520  GI: bowel sounds present, nontender to palpation, colostomy bag in place  Extremities: no pitting edema on LE  Skin: no rashes noted on exam    Assessment & Plan:   Acute on chronic hypoxic/hypercapnic respiratory failure from HCAP. COPD without exacerbation. Tobacco abuse attempt wean ventilator with goal to extubate Will need to wean fentanyl  Day 4/5 of antibiotics, Zosyn; received Vanc for 1 day, since discontinued  O2 sat goal > 88% Chest x-ray reviewed 7/2,with improved lung fields   Acute metabolic encephalopathy 2nd to hypoxia/hypercapnia. RASS goal of 0 She is awake and alert no longer having metabolic encephalopathy  Hx of CAD, chronic systolic CHF, HLD. Continue aspirin and Brilinta and Crestor Continue to hold Lasix Cozaar and Toprol for now as patient has had lower blood pressures   DM type 2 with steroid induced hyperglycemia. CBG (last 3)  Recent Labs    09/07/19 2318 09/08/19 0342 09/08/19 0759  GLUCAP 123* 100* 88  Sliding-scale insulin protocol  Hx NSCLC s/p partial resection XLT. Hx anal cancer s/p colectomy. Chronic cancer pain. Continue buprenorphine patch Ostomy care  GERD. Continue Protonix  Hypokalemia. Recent Labs  Lab 09/06/19 1620 09/07/19 0652 09/08/19 0245  K  3.1* 3.8 3.7    Serial be met  Vaginal candidiasis. Completed 1 dose of Diflucan  Best practice:  Diet: tube feeds DVT prophylaxis: lovenox GI prophylaxis: protonix Mobility: bed rest Code Status: Full  Disposition:  ICU Family update: 09/08/19, provided treatment plan update to patient's family via telephone  Labs    CMP Latest Ref Rng & Units 09/08/2019 09/07/2019  09/06/2019  Glucose 70 - 99 mg/dL 118(H) 124(H) 139(H)  BUN 8 - 23 mg/dL 30(H) 25(H) 21  Creatinine 0.44 - 1.00 mg/dL 0.97 1.11(H) 1.23(H)  Sodium 135 - 145 mmol/L 144 144 143  Potassium 3.5 - 5.1 mmol/L 3.7 3.8 3.1(L)  Chloride 98 - 111 mmol/L 104 104 102  CO2 22 - 32 mmol/L 30 32 32  Calcium 8.9 - 10.3 mg/dL 9.0 9.0 8.9  Total Protein 6.5 - 8.1 g/dL - - -  Total Bilirubin 0.3 - 1.2 mg/dL - - -  Alkaline Phos 38 - 126 U/L - - -  AST 15 - 41 U/L - - -  ALT 0 - 44 U/L - - -    CBC Latest Ref Rng & Units 09/08/2019 09/07/2019 09/06/2019  WBC 4.0 - 10.5 K/uL 5.2 5.8 -  Hemoglobin 12.0 - 15.0 g/dL 7.5(L) 7.7(L) 12.9  Hematocrit 36 - 46 % 26.1(L) 27.3(L) 38.0  Platelets 150 - 400 K/uL 191 186 -    ABG    Component Value Date/Time   PHART 7.522 (H) 09/06/2019 0344   PCO2ART 41.1 09/06/2019 0344   PO2ART 160 (H) 09/06/2019 0344   HCO3 33.7 (H) 09/06/2019 0344   TCO2 35 (H) 09/06/2019 0344   ACIDBASEDEF 1.0 08/15/2019 1944   O2SAT 100.0 09/06/2019 0344    CBG (last 3)  Recent Labs    09/07/19 2318 09/08/19 0342 09/08/19 0759  GLUCAP 123* 100* 88     App 30 min   Eulis Foster, MD  [facility] Res[facility], PGY-2   09/08/2019, 11:18 AM

## 2019-09-09 LAB — BASIC METABOLIC PANEL
Anion gap: 9 (ref 5–15)
BUN: 25 mg/dL — ABNORMAL HIGH (ref 8–23)
CO2: 30 mmol/L (ref 22–32)
Calcium: 8.8 mg/dL — ABNORMAL LOW (ref 8.9–10.3)
Chloride: 106 mmol/L (ref 98–111)
Creatinine, Ser: 0.91 mg/dL (ref 0.44–1.00)
GFR calc Af Amer: >60 mL/min (ref >60)
GFR calc non Af Amer: >60 mL/min (ref >60)
Glucose, Bld: 145 mg/dL — ABNORMAL HIGH (ref 70–99)
Potassium: 3.3 mmol/L — ABNORMAL LOW (ref 3.5–5.1)
Sodium: 145 mmol/L (ref 135–145)

## 2019-09-09 LAB — MAGNESIUM: Magnesium: 2.5 mg/dL — ABNORMAL HIGH (ref 1.7–2.4)

## 2019-09-09 LAB — PHOSPHORUS: Phosphorus: 3.3 mg/dL (ref 2.5–4.6)

## 2019-09-09 LAB — GLUCOSE, CAPILLARY
Glucose-Capillary: 114 mg/dL — ABNORMAL HIGH (ref 70–99)
Glucose-Capillary: 129 mg/dL — ABNORMAL HIGH (ref 70–99)
Glucose-Capillary: 130 mg/dL — ABNORMAL HIGH (ref 70–99)
Glucose-Capillary: 152 mg/dL — ABNORMAL HIGH (ref 70–99)
Glucose-Capillary: 153 mg/dL — ABNORMAL HIGH (ref 70–99)
Glucose-Capillary: 154 mg/dL — ABNORMAL HIGH (ref 70–99)

## 2019-09-09 LAB — CBC
HCT: 26.5 % — ABNORMAL LOW (ref 36.0–46.0)
Hemoglobin: 7.4 g/dL — ABNORMAL LOW (ref 12.0–15.0)
MCH: 25.8 pg — ABNORMAL LOW (ref 26.0–34.0)
MCHC: 27.9 g/dL — ABNORMAL LOW (ref 30.0–36.0)
MCV: 92.3 fL (ref 80.0–100.0)
Platelets: 238 10*3/uL (ref 150–400)
RBC: 2.87 MIL/uL — ABNORMAL LOW (ref 3.87–5.11)
RDW: 17.7 % — ABNORMAL HIGH (ref 11.5–15.5)
WBC: 3.9 10*3/uL — ABNORMAL LOW (ref 4.0–10.5)
nRBC: 0 % (ref 0.0–0.2)

## 2019-09-09 MED ORDER — POTASSIUM CHLORIDE 10 MEQ/100ML IV SOLN
10.0000 meq | INTRAVENOUS | Status: DC
  Administered 2019-09-09: 10 meq via INTRAVENOUS
  Filled 2019-09-09 (×2): qty 100

## 2019-09-09 MED ORDER — POTASSIUM CHLORIDE 20 MEQ/15ML (10%) PO SOLN
40.0000 meq | ORAL | Status: AC
  Administered 2019-09-09: 40 meq
  Filled 2019-09-09: qty 30

## 2019-09-09 MED ORDER — POTASSIUM CHLORIDE 20 MEQ/15ML (10%) PO SOLN
20.0000 meq | ORAL | Status: DC
  Administered 2019-09-09: 20 meq
  Filled 2019-09-09: qty 15

## 2019-09-09 NOTE — Progress Notes (Signed)
[facility] ADULT ICU REPLACEMENT PROTOCOL   The patient does apply for the [facility] Adult ICU Electrolyte Replacment Protocol based on the criteria listed below:   1. Is GFR >/= 30 ml/min? Yes.    Patient's GFR today is >60 2. Is SCr </= 2? Yes.   Patient's SCr is 0.91 ml/kg/hr 3. Did SCr increase >/= 0.5 in 24 hours? no 4. Abnormal electrolyte(s): k 3.3 5. Ordered repletion with: protocol 6. If a panic level lab has been reported, has the CCM MD in charge been notified? No..   Physician:    Ronda Fairly A 09/09/2019 6:39 AM

## 2019-09-09 NOTE — Progress Notes (Signed)
NAME:  Chelsea Allison, MRN:  [id number], DOB:  [birth year], LOS: 4 ADMISSION DATE:  09/05/2019, CONSULTATION DATE:  09/05/19 REFERRING MD:  Stark Jock- EM, CHIEF COMPLAINT:  Respiratory Distress, AMS  Brief History   64 yo female smoker presented to ED with altered mental status from acute on chronic hypoxic/hypercapnic respiratory failure 2nd to HCAP in setting of COPD.  Past Medical History  NSCLS s/p partial resection Anal cancer s/p colostomy CAD with MI s/p PCI COPD on home O2 HFrEF Tobacco abuse  Acute on chronic respiratory failure requiring intubation, repeated   Significant Hospital Events   6/29 admitted to PCCM   Procedures:  6/29 ETT>   Significant Diagnostic Tests:  CXR 7/2: appropriate positioning of ETT, NG tubes, no acute overlying lung processes noted   Micro Data:  6/29 SARS Cov2>negative 6/29 tracheal aspirate>  6/29 UCx> less than 10,000 colonies insignificant growth 6/29 BCx> 6/29 MRSA PCR>negative  Antimicrobials:  6/29 vancomycin>6/30 6/29 pip/tazo> 7/3  Interim history/subjective:  Discussion concerning possible tracheostomy future.  She wants to try for elective extubation and if fails will consider tracheostomy.  Talk  Objective   Blood pressure 119/71, pulse 99, temperature 99.7 F (37.6 C), temperature source Oral, resp. rate 20, height 5\' 9"  (1.753 m), weight 88.6 kg, SpO2 98 %.    Vent Mode: PRVC FiO2 (%):  [40 %] 40 % Set Rate:  [18 bmp] 18 bmp Vt Set:  [520 mL] 520 mL PEEP:  [5 cmH20] 5 cmH20 Pressure Support:  [10 cmH20] 10 cmH20 Plateau Pressure:  [10 LFY10-17 cmH20] 20 cmH20   Intake/Output Summary (Last 24 hours) at 09/09/2019 5102 Last data filed at 09/09/2019 0700 Gross per 24 hour  Intake 1882.18 ml  Output 1195 ml  Net 687.18 ml   Filed Weights   09/07/19 0500 09/08/19 0500 09/09/19 0500  Weight: 85 kg 86.9 kg 88.6 kg    Examination:  General: female appearing stated age, sitting upright in bed  HEENT: eyes open, no scleral  icterus, EOMI bilaterally, intubated  Neuro:alert and oriented CV: tachycardia  PULM: bilateral and diffuse coarse rhonchi  GI: soft, nontender, bowel sounds present colostomy bag in place without leakage around ostomy site  Extremities: no LE edema  Skin: no rashes   Assessment & Plan:   Acute on chronic hypoxic/hypercapnic respiratory failure from HCAP. COPD without exacerbation. Tobacco abuse, encouraged cessation  attempt wean ventilator with goal to extubate Will need to wean fentanyl after extubation as patient on pain regimen for ventilator  Day 5/5 of antibiotics, Zosyn  O2 sat goal > 88% Chest x-ray on 7/2 reviewed, with improved lung fields  Hx of CAD, chronic systolic CHF, HLD. Continue aspirin and Brilinta and Crestor Continue to hold Lasix Cozaar and Toprol for now as patient has had lower blood pressures   DM type 2 with steroid induced hyperglycemia Last CBGs (585>277>824) -continue moderate sliding-scale -CBG monitoring    Hx NSCLC s/p partial resection XLT. Hx anal cancer s/p colectomy. Chronic cancer pain. Continue buprenorphine patch Ostomy care  GERD Continue Protonix 40mg  daily   Hypokalemia, 3.3 7/3  -Replenish K with PO 64mEq x2  -Monitor with BMP    Best practice:  Diet: tube feeds DVT prophylaxis: lovenox GI prophylaxis: protonix Mobility: bed rest Code Status: Full  Disposition:  ICU Family update: last family update on 7/2  Labs    CMP Latest Ref Rng & Units 09/09/2019 09/08/2019 09/07/2019  Glucose 70 - 99 mg/dL 145(H) 118(H) 124(H)  BUN 8 -  23 mg/dL 25(H) 30(H) 25(H)  Creatinine 0.44 - 1.00 mg/dL 0.91 0.97 1.11(H)  Sodium 135 - 145 mmol/L 145 144 144  Potassium 3.5 - 5.1 mmol/L 3.3(L) 3.7 3.8  Chloride 98 - 111 mmol/L 106 104 104  CO2 22 - 32 mmol/L 30 30 32  Calcium 8.9 - 10.3 mg/dL 8.8(L) 9.0 9.0  Total Protein 6.5 - 8.1 g/dL - - -  Total Bilirubin 0.3 - 1.2 mg/dL - - -  Alkaline Phos 38 - 126 U/L - - -  AST 15 - 41 U/L - - -   ALT 0 - 44 U/L - - -    CBC Latest Ref Rng & Units 09/09/2019 09/08/2019 09/07/2019  WBC 4.0 - 10.5 K/uL 3.9(L) 5.2 5.8  Hemoglobin 12.0 - 15.0 g/dL 7.4(L) 7.5(L) 7.7(L)  Hematocrit 36 - 46 % 26.5(L) 26.1(L) 27.3(L)  Platelets 150 - 400 K/uL 238 191 186    ABG    Component Value Date/Time   PHART 7.522 (H) 09/06/2019 0344   PCO2ART 41.1 09/06/2019 0344   PO2ART 160 (H) 09/06/2019 0344   HCO3 33.7 (H) 09/06/2019 0344   TCO2 35 (H) 09/06/2019 0344   ACIDBASEDEF 1.0 08/15/2019 1944   O2SAT 100.0 09/06/2019 0344    CBG (last 3)  Recent Labs    09/08/19 2333 09/09/19 0317 09/09/19 0813  GLUCAP 132* 129* 153*     App 30 min   Eulis Foster, MD  [facility] Family Medicine Residency, PGY-2   09/09/2019, 9:26 AM

## 2019-09-10 LAB — CBC
HCT: 27.7 % — ABNORMAL LOW (ref 36.0–46.0)
Hemoglobin: 7.6 g/dL — ABNORMAL LOW (ref 12.0–15.0)
MCH: 25.8 pg — ABNORMAL LOW (ref 26.0–34.0)
MCHC: 27.4 g/dL — ABNORMAL LOW (ref 30.0–36.0)
MCV: 93.9 fL (ref 80.0–100.0)
Platelets: 314 10*3/uL (ref 150–400)
RBC: 2.95 MIL/uL — ABNORMAL LOW (ref 3.87–5.11)
RDW: 17.6 % — ABNORMAL HIGH (ref 11.5–15.5)
WBC: 4.6 10*3/uL (ref 4.0–10.5)
nRBC: 0 % (ref 0.0–0.2)

## 2019-09-10 LAB — BASIC METABOLIC PANEL
Anion gap: 9 (ref 5–15)
BUN: 22 mg/dL (ref 8–23)
CO2: 29 mmol/L (ref 22–32)
Calcium: 8.9 mg/dL (ref 8.9–10.3)
Chloride: 108 mmol/L (ref 98–111)
Creatinine, Ser: 0.84 mg/dL (ref 0.44–1.00)
GFR calc Af Amer: >60 mL/min (ref >60)
GFR calc non Af Amer: >60 mL/min (ref >60)
Glucose, Bld: 147 mg/dL — ABNORMAL HIGH (ref 70–99)
Potassium: 4.5 mmol/L (ref 3.5–5.1)
Sodium: 146 mmol/L — ABNORMAL HIGH (ref 135–145)

## 2019-09-10 LAB — GLUCOSE, CAPILLARY
Glucose-Capillary: 159 mg/dL — ABNORMAL HIGH (ref 70–99)
Glucose-Capillary: 143 mg/dL — ABNORMAL HIGH (ref 70–99)
Glucose-Capillary: 144 mg/dL — ABNORMAL HIGH (ref 70–99)
Glucose-Capillary: 128 mg/dL — ABNORMAL HIGH (ref 70–99)
Glucose-Capillary: 141 mg/dL — ABNORMAL HIGH (ref 70–99)

## 2019-09-10 LAB — CULTURE, BLOOD (ROUTINE X 2)
Culture: NO GROWTH
Culture: NO GROWTH
Special Requests: ADEQUATE

## 2019-09-10 LAB — LACTATE DEHYDROGENASE: LDH: 191 U/L (ref 98–192)

## 2019-09-10 LAB — RETICULOCYTES
Immature Retic Fract: 11.5 % (ref 2.3–15.9)
RBC.: 2.94 MIL/uL — ABNORMAL LOW (ref 3.87–5.11)
Retic Count, Absolute: 67 10*3/uL (ref 19.0–186.0)
Retic Ct Pct: 2.4 % (ref 0.4–3.1)

## 2019-09-10 MED ORDER — FUROSEMIDE 10 MG/ML IJ SOLN
20.0000 mg | Freq: Once | INTRAMUSCULAR | Status: AC
  Administered 2019-09-10: 20 mg via INTRAVENOUS
  Filled 2019-09-10: qty 2

## 2019-09-10 MED ORDER — CLONAZEPAM 0.5 MG PO TABS
0.5000 mg | ORAL_TABLET | Freq: Two times a day (BID) | ORAL | Status: DC
  Administered 2019-09-10 – 2019-09-12 (×5): 0.5 mg
  Filled 2019-09-10 (×6): qty 1

## 2019-09-10 MED ORDER — DEXMEDETOMIDINE HCL IN NACL 400 MCG/100ML IV SOLN
0.0000 ug/kg/h | INTRAVENOUS | Status: AC
  Administered 2019-09-10 (×2): 0.4 ug/kg/h via INTRAVENOUS
  Administered 2019-09-11 (×2): 1 ug/kg/h via INTRAVENOUS
  Administered 2019-09-11: 0.4 ug/kg/h via INTRAVENOUS
  Administered 2019-09-12 (×3): 1.2 ug/kg/h via INTRAVENOUS
  Administered 2019-09-12 (×2): 1.1 ug/kg/h via INTRAVENOUS
  Administered 2019-09-12 – 2019-09-13 (×4): 1.2 ug/kg/h via INTRAVENOUS
  Filled 2019-09-10 (×14): qty 100

## 2019-09-10 NOTE — Plan of Care (Signed)

## 2019-09-10 NOTE — Progress Notes (Signed)
Pt with complaint of far left side of mouth hurting when ETT placed on that side. ETT moved to center. RT will continue to monitor.

## 2019-09-10 NOTE — Progress Notes (Addendum)
NAME:  Chelsea Allison, MRN:  [id number], DOB:  Dec 13, 1955, LOS: 5 ADMISSION DATE:  09/05/2019, CONSULTATION DATE:  09/05/19 REFERRING MD:  Stark Jock- EM, CHIEF COMPLAINT:  Respiratory Distress, AMS  Brief History   64 yo female smoker presented to ED with altered mental status from acute on chronic hypoxic/hypercapnic respiratory failure 2nd to HCAP in setting of COPD.  Past Medical History  NSCLS s/p partial resection Anal cancer s/p colostomy CAD with MI s/p PCI COPD on home O2 HFrEF Tobacco abuse  Acute on chronic respiratory failure requiring intubation, repeated   Significant Hospital Events   6/29 admitted to [facility]  7/1 hgb drop.  7/2 SBTx4h 7/4 only tolerated 32min on pressure support. Starting precedex/klonapin. Obtaining hemolytic labs.  Procedures:  6/29 ETT>   Significant Diagnostic Tests:  CXR 7/2: appropriate positioning of ETT, NG tubes, no acute overlying lung processes noted   Micro Data:  6/29 SARS Cov2>negative 6/29 tracheal aspirate>  6/29 UCx> less than 10,000 colonies insignificant growth 6/29 BCx> 6/29 MRSA PCR>negative  Antimicrobials:  6/29 vancomycin>6/30 6/29 pip/tazo> 7/3  Interim history/subjective:  Discussed issues with SBT this morning and she relayed that she feels short of breath and chest tightness when switched over to pressure support. We discussed how this can be driven from a number of things including lung issues or anxiety. She relayed to me that she thinks it is primarily anxiety because she is afraid of what will happen. She reports fairly well controlled pain. I relayed to her the plan to start precedex in hopes of controlling anxiety. Before leaving the room, I asked if she had any questions. She wrote "Are you leaving me?". I explained I would be stepping out of the room to put in orders. She wrote down "please don't go far". I explained that her nurse will be able to reach me for any issues and I will be on the unit for the time  being.  Objective   Blood pressure 109/66, pulse 97, temperature 99.2 F (37.3 C), temperature source Axillary, resp. rate 19, height 5\' 9"  (1.753 m), weight 89.1 kg, SpO2 97 %.    Vent Mode: PRVC FiO2 (%):  [40 %] 40 % Set Rate:  [18 bmp] 18 bmp Vt Set:  [520 mL] 520 mL PEEP:  [5 cmH20] 5 cmH20 Plateau Pressure:  [24 cmH20-37 cmH20] 24 cmH20   Intake/Output Summary (Last 24 hours) at 09/10/2019 0757 Last data filed at 09/10/2019 0600 Gross per 24 hour  Intake 2016.93 ml  Output 850 ml  Net 1166.93 ml   Filed Weights   09/08/19 0500 09/09/19 0500 09/10/19 0500  Weight: 86.9 kg 88.6 kg 89.1 kg    Examination:  General: chronically ill appearing female HEENT: ETT Neuro:alert and oriented CV: tachycardic rate, regular rhythm. No LE edema. Extremities warm PULM: inspiratory and expiratory fine crackles R>L GI: nondistended. Liquid stool in ostomy bag. abd soft. Diffuse tenderness to palpation. bs present Extremities: moving all 4 extremities Skin: no rashes   Assessment & Plan:   Acute on chronic hypercapnic hypoxic respiratory failure requiring mechanical ventilation 2/2 HCAP Completed 5d course of zosyn Has required repeated intubations over the past month. Intubated 6/5-6/6, 6/6-6/7, 6/8-6/12, 6/29-present.  Intubation day #15 total. Remains on minimal vent settings. Only lasted about 34min on 14/5 PS this morning before O2 sats dropped and became tachy in the 140s. Peak pressures into the mid 40s.  Anxiety seems primary driving factor of being unable extubate. She is up 4.3L from admission as  well which may be playing a part however does not appear hypervolemic on exam and minimal pulm edema on 7/2's CXR.  Some blood secretions first noted yesterday as well. Possibly from repeat intubations vs from suctioning.  Plan: will start precedex and klonapin BID. Pt hesitant about tracheostomy, however may need to consider further if unable to extubate soon.  Chronic anxiety. On prn  xanax at home. Given the difficulties she is having getting off the ventilator in addition to our conversation this morning, will start her on bid klonopin and add some precedex in hopes of improving her chances with SBT.  Chronic HFrEF. Up 4.3L from admission. Cozaar and metoprolol being held for soft pressures. Not appearing volume overloaded on exam.  Plan:  Lasix 20mg  IV. Strict I/O.  History of CAD. Continue statin and brilinta.   Normocytic Anemia.  Stable from yesterday however does appear to have had a fairly significant drop from 6/30.  Minimal blood loss with secretions so unsure of source--ACD vs critical illness vs hemolysis vs acute blood loss Plan --will obtain some hemolytic labs>LDH, haptoglobin, retic count --if those are unrevealing, may need to consider abdominal CT to evaluate for bleed  DM type 2. Blood sugars stable. Continue q4h 3U novolog  Chronic cancer related pain. Patient relaying fairly well pain control at this time. Currently on fentanyl drip as well as buprenorphine.   Hx NSCLC s/p partial resection XLT. Hx anal cancer s/p colectomy.  High risk malnutrition. Continue tube feeds.   Best practice:  Diet: tube feeds DVT prophylaxis: lovenox GI prophylaxis: protonix Mobility: bed rest Code Status: Full  Disposition:  ICU Family update: last family update on 7/2  Labs    CMP Latest Ref Rng & Units 09/10/2019 09/09/2019 09/08/2019  Glucose 70 - 99 mg/dL 147(H) 145(H) 118(H)  BUN 8 - 23 mg/dL 22 25(H) 30(H)  Creatinine 0.44 - 1.00 mg/dL 0.84 0.91 0.97  Sodium 135 - 145 mmol/L 146(H) 145 144  Potassium 3.5 - 5.1 mmol/L 4.5 3.3(L) 3.7  Chloride 98 - 111 mmol/L 108 106 104  CO2 22 - 32 mmol/L 29 30 30   Calcium 8.9 - 10.3 mg/dL 8.9 8.8(L) 9.0  Total Protein 6.5 - 8.1 g/dL - - -  Total Bilirubin 0.3 - 1.2 mg/dL - - -  Alkaline Phos 38 - 126 U/L - - -  AST 15 - 41 U/L - - -  ALT 0 - 44 U/L - - -    CBC Latest Ref Rng & Units 09/10/2019 09/09/2019 09/08/2019   WBC 4.0 - 10.5 K/uL 4.6 3.9(L) 5.2  Hemoglobin 12.0 - 15.0 g/dL 7.6(L) 7.4(L) 7.5(L)  Hematocrit 36 - 46 % 27.7(L) 26.5(L) 26.1(L)  Platelets 150 - 400 K/uL 314 238 191    ABG    Component Value Date/Time   PHART 7.522 (H) 09/06/2019 0344   PCO2ART 41.1 09/06/2019 0344   PO2ART 160 (H) 09/06/2019 0344   HCO3 33.7 (H) 09/06/2019 0344   TCO2 35 (H) 09/06/2019 0344   ACIDBASEDEF 1.0 08/15/2019 1944   O2SAT 100.0 09/06/2019 0344    CBG (last 3)  Recent Labs    09/09/19 2355 09/10/19 0342 09/10/19 [place], MD  Internal Medicine Resident-PGY-2 09/10/19 8:44 AM

## 2019-09-10 NOTE — Progress Notes (Signed)
RT attempted to wean pt on CPAP/PS 12/5  40%. Pt tolerated for 15 minutes then desaturated to the 70's, increased WOB, increased HR and RR. CCM MD notified. Pt returned to full support. RT will continue to monitor.

## 2019-09-10 NOTE — Progress Notes (Signed)
Attending note attestation to Resident Dr Darrick Meigs note from today: I have seen and examined the patient. History, labs and imaging reviewed.  Interval History:  7/4:  Pt fails multiple attempts at sbt 2/2 increased rr and anxiety. She reports that she feels like she cannot breathe when she is on it and she has chest tightness.  7/3: pt to attempt longer sbt today if possible. She has had multiple reintubations and we are hopeful for successful one the next time as she is reluctant for trach. She does have more bloody secretions today ? From suctioning or other. Will cont to follow. Offers no complaints.   Blood pressure 117/78, pulse (!) 105, temperature 99.7 F (37.6 C), temperature source Oral, resp. rate (!) 29, height 5\' 9"  (1.753 m), weight 88.6 kg, SpO2 99 %. Gen:       No acute distress, reclining comfortably in bed HEENT:  NCAT, ett in place, EOMI, sclera anicteric Neck:     No masses; no thyromegaly Lungs:    diminished bilaterally CV:         Regular rate and rhythm; no murmurs Abd:        + bowel sounds; soft, non-tender; no palpable masses, no distension Ext:         No edema; adequate peripheral perfusion Skin:      Warm and dry; no rash Neuro: alert and oriented x 3, sitting up writing notes Psych: normal mood and affect   Labs reviewed, significant for K 3.3->4.5  hgb 7.6  Imaging: no new imaging  Assessment/plan: Acute on chronic hypercapnic hypoxic resp failure:  -frequent hospitalizations and intubations recently -if unsuccessful extubation would warrant trach -ps trials have been unsuccessful 2/2 anxiety -trial of precedex as we resume benzo -does have some bloody secretions, will minimize suctioning as able.  -cont bronchodilators.  -plts ok.   Hypokalemia:  -resolved  Anxiety:  -pt takes xanax at home -have resume benzo but utilizing klonopin 2/2 longer duration for hopeful vent wean -adding precedex in the interim  Cad Chronic HFrEF: -dose  diuretic today  Dm2 with hyperglycemia  normocytic anemia:  -upon chart review appears pt was normal range at presentation -she has trended down but is stable now in the 7's.  -no overt bleeding noted with exception of some bloody secretions but not enough to account for drop -no large bruising noted -will check ldh, haptoglobin, iron panel -follow trend -if serologic tests are negative may warrant ct to eval abdomen?  Critical care time: The patient is critically ill with multiple organ systems failure and requires high complexity decision making for assessment and support, frequent evaluation and titration of therapies, application of advanced monitoring technologies and extensive interpretation of multiple databases.  Critical care time 35 mins. This represents my time independent of the NPs time taking care of the pt. This is excluding procedures.    [facility] 09/10/2019, 10:10 AM

## 2019-09-11 ENCOUNTER — Inpatient Hospital Stay (HOSPITAL_COMMUNITY): Payer: Medicare HMO

## 2019-09-11 DIAGNOSIS — L8990 Pressure ulcer of unspecified site, unspecified stage: Secondary | ICD-10-CM | POA: Insufficient documentation

## 2019-09-11 LAB — GLUCOSE, CAPILLARY
Glucose-Capillary: 123 mg/dL — ABNORMAL HIGH (ref 70–99)
Glucose-Capillary: 126 mg/dL — ABNORMAL HIGH (ref 70–99)
Glucose-Capillary: 137 mg/dL — ABNORMAL HIGH (ref 70–99)
Glucose-Capillary: 138 mg/dL — ABNORMAL HIGH (ref 70–99)
Glucose-Capillary: 143 mg/dL — ABNORMAL HIGH (ref 70–99)
Glucose-Capillary: 148 mg/dL — ABNORMAL HIGH (ref 70–99)
Glucose-Capillary: 158 mg/dL — ABNORMAL HIGH (ref 70–99)

## 2019-09-11 LAB — CBC
HCT: 26.8 % — ABNORMAL LOW (ref 36.0–46.0)
Hemoglobin: 7.4 g/dL — ABNORMAL LOW (ref 12.0–15.0)
MCH: 26.1 pg (ref 26.0–34.0)
MCHC: 27.6 g/dL — ABNORMAL LOW (ref 30.0–36.0)
MCV: 94.7 fL (ref 80.0–100.0)
Platelets: 379 10*3/uL (ref 150–400)
RBC: 2.83 MIL/uL — ABNORMAL LOW (ref 3.87–5.11)
RDW: 17.4 % — ABNORMAL HIGH (ref 11.5–15.5)
WBC: 4.6 10*3/uL (ref 4.0–10.5)
nRBC: 0 % (ref 0.0–0.2)

## 2019-09-11 LAB — BASIC METABOLIC PANEL
Anion gap: 9 (ref 5–15)
BUN: 27 mg/dL — ABNORMAL HIGH (ref 8–23)
CO2: 30 mmol/L (ref 22–32)
Calcium: 9.3 mg/dL (ref 8.9–10.3)
Chloride: 109 mmol/L (ref 98–111)
Creatinine, Ser: 0.78 mg/dL (ref 0.44–1.00)
GFR calc Af Amer: >60 mL/min (ref >60)
GFR calc non Af Amer: >60 mL/min (ref >60)
Glucose, Bld: 154 mg/dL — ABNORMAL HIGH (ref 70–99)
Potassium: 4.1 mmol/L (ref 3.5–5.1)
Sodium: 148 mmol/L — ABNORMAL HIGH (ref 135–145)

## 2019-09-11 MED ORDER — POLYETHYLENE GLYCOL 3350 17 G PO PACK
17.0000 g | PACK | Freq: Every day | ORAL | Status: DC
  Administered 2019-09-19 – 2019-10-02 (×8): 17 g
  Filled 2019-09-11 (×17): qty 1

## 2019-09-11 MED ORDER — ENOXAPARIN SODIUM 40 MG/0.4ML ~~LOC~~ SOLN
40.0000 mg | SUBCUTANEOUS | Status: DC
  Administered 2019-09-13 – 2019-10-02 (×20): 40 mg via SUBCUTANEOUS
  Filled 2019-09-11 (×22): qty 0.4

## 2019-09-11 MED ORDER — VECURONIUM BROMIDE 10 MG IV SOLR
10.0000 mg | Freq: Once | INTRAVENOUS | Status: AC
  Administered 2019-09-12: 10 mg via INTRAVENOUS
  Filled 2019-09-11: qty 10

## 2019-09-11 MED ORDER — FENTANYL CITRATE (PF) 100 MCG/2ML IJ SOLN
200.0000 ug | Freq: Once | INTRAMUSCULAR | Status: AC
  Administered 2019-09-12: 50 ug via INTRAVENOUS

## 2019-09-11 MED ORDER — MIDAZOLAM HCL 2 MG/2ML IJ SOLN
5.0000 mg | Freq: Once | INTRAMUSCULAR | Status: AC
  Administered 2019-09-12: 4 mg via INTRAVENOUS
  Filled 2019-09-11: qty 6

## 2019-09-11 MED ORDER — METOLAZONE 5 MG PO TABS
5.0000 mg | ORAL_TABLET | Freq: Once | ORAL | Status: AC
  Administered 2019-09-11: 5 mg via ORAL
  Filled 2019-09-11: qty 1

## 2019-09-11 MED ORDER — ETOMIDATE 2 MG/ML IV SOLN
40.0000 mg | Freq: Once | INTRAVENOUS | Status: AC
  Administered 2019-09-12: 20 mg via INTRAVENOUS
  Filled 2019-09-11: qty 20

## 2019-09-11 MED ORDER — FUROSEMIDE 10 MG/ML IJ SOLN
20.0000 mg | Freq: Once | INTRAMUSCULAR | Status: AC
  Administered 2019-09-11: 20 mg via INTRAVENOUS
  Filled 2019-09-11: qty 2

## 2019-09-11 MED ORDER — COLLAGENASE 250 UNIT/GM EX OINT
TOPICAL_OINTMENT | Freq: Every day | CUTANEOUS | Status: DC
  Administered 2019-09-12 – 2019-09-22 (×6): 1 via TOPICAL
  Filled 2019-09-11 (×2): qty 30

## 2019-09-11 NOTE — Progress Notes (Addendum)
09/11/2019 Seen and examined for recurrent respiratory failure. I have seen her at least 3 times for intubations related to COPD. She is anxious this morning. Has diminished breath sounds on exam. Maybe trace edema  A: Recurrent hypoxemic and hypercarbic respiratory failure related to advanced COPD with tracheobronchomalacia.  Has been intubated 5 times within past 6 months.  P: Only real path forward is trach.  Discussed at length with patient.  She wants [place] to touch base with her sisters regarding  There may be some volume overload, also hypernatremic, will give dose of metolazone.  Anemia- new this admission, ?cause, on PPI, check iron panel, usual transfusion threshold  Hold brillinta today in anticipation of trach tomorrow  32 minutes cc time Erskine Emery MD PCCM       NAME:  Chelsea Allison, MRN:  [id number], DOB:  [birth year], LOS: 6 ADMISSION DATE:  09/05/2019, CONSULTATION DATE:  09/05/19 REFERRING MD:  Stark Jock- EM, CHIEF COMPLAINT:  Respiratory Distress, AMS  Brief History   64 yo female smoker presented to ED with altered mental status from acute on chronic hypoxic/hypercapnic respiratory failure 2nd to HCAP in setting of COPD.  Past Medical History  NSCLS s/p partial resection Anal cancer s/p colostomy CAD with MI s/p PCI COPD on home O2 HFrEF Tobacco abuse  Acute on chronic respiratory failure requiring intubation, repeated   Significant Hospital Events   6/29 admitted to [facility]  7/1 hgb drop.  7/2 SBTx4h 7/4 only tolerated 74min on pressure support. Starting precedex/klonapin. Obtaining hemolytic labs.  Procedures:  6/29 ETT>   Significant Diagnostic Tests:  CXR 7/2: appropriate positioning of ETT, NG tubes, no acute overlying lung processes noted   Micro Data:  6/29 SARS Cov2>negative 6/29 tracheal aspirate>  6/29 UCx> less than 10,000 colonies insignificant growth 6/29 BCx> 6/29 MRSA PCR>negative  Antimicrobials:  6/29 vancomycin>6/30 6/29 pip/tazo>  7/3  Interim history/subjective:  Further discussed need for tracheostomy today and reasoning for this. Discussed that we will do another SBT today since starting the klonapin and precedex and see how she does. Pt did seem somewhat ammenable to trach if continuing to fail.  Objective   Blood pressure 114/74, pulse 92, temperature 98.8 F (37.1 C), temperature source Oral, resp. rate (!) 38, height 5\' 9"  (1.753 m), weight 90.6 kg, SpO2 100 %.    Vent Mode: PRVC FiO2 (%):  [40 %-50 %] 40 % Set Rate:  [18 bmp] 18 bmp Vt Set:  [520 mL] 520 mL PEEP:  [5 cmH20] 5 cmH20 Plateau Pressure:  [13 cmH20-31 cmH20] 13 cmH20   Intake/Output Summary (Last 24 hours) at 09/11/2019 0903 Last data filed at 09/11/2019 0551 Gross per 24 hour  Intake 1645.75 ml  Output 2085 ml  Net -439.25 ml   Filed Weights   09/09/19 0500 09/10/19 0500 09/11/19 0414  Weight: 88.6 kg 89.1 kg 90.6 kg    Examination:  General: chronically ill appearing female HEENT: ETT Neuro:alert and oriented CV: tachycardic rate, regular rhythm. No LE edema. Extremities warm PULM: inspiratory and expiratory fine crackles R>L GI: nondistended. Liquid stool in ostomy bag. abd soft. Diffuse tenderness to palpation. bs present Extremities: moving all 4 extremities Skin: no rashes   Assessment & Plan:   Acute on chronic hypercapnic hypoxic respiratory failure requiring mechanical ventilation 2/2 HCAP Completed 5d course of zosyn Has required repeated intubations over the past month. Intubated 6/5-6/6, 6/6-6/7, 6/8-6/12, 6/29-present.  Intubation day #16 total. Remains on minimal vent settings however I'm thinking anxiety is the primary thing  barring extubation. Continues to fail SBTs. We did start her on precedex and restarted her home dose of xanax yesterday. I Slight worsening of left lung on CXR--diffuse findings>pulm edema? Remains afebrile without a leukocytosis further decreasing liklihood of infectious source. Plan: continue  precedex and klonapin BID. If she fails SBT today, will need to consider trach sometime this week.  Chronic anxiety. Continue klonapin and precedex  Chronic HFrEF. Up 5L from admission. Good UOP yesterday with lasix Plan:  Will give another dose of lasix today. Strict I/O.  History of CAD. Continue statin and brilinta.   Normocytic Anemia.  Stable from yesterday however does appear to have had a fairly significant drop from 6/30.  Unclear source of hgb drop. LDH only minimally elevated. Haptoglobin still in process. RPI 1.5 indicating a hypoproliferation component as well however would not expect this to have caused a sudden drop over a day.  Plan --f/u haptoglobin --if those are unrevealing, may need to consider abdominal CT to evaluate for bleed  DM type 2. Blood sugars stable. Continue q4h 3U novolog  Chronic cancer related pain. Patient relaying fairly well pain control at this time. Currently on fentanyl drip as well as buprenorphine.   Hx NSCLC s/p partial resection XLT. Hx anal cancer s/p colectomy.  High risk malnutrition. Continue tube feeds.   Best practice:  Diet: tube feeds DVT prophylaxis: lovenox GI prophylaxis: protonix Mobility: bed rest Code Status: Full  Disposition:  ICU Family update: last family update on 7/2  Labs    CMP Latest Ref Rng & Units 09/11/2019 09/10/2019 09/09/2019  Glucose 70 - 99 mg/dL 154(H) 147(H) 145(H)  BUN 8 - 23 mg/dL 27(H) 22 25(H)  Creatinine 0.44 - 1.00 mg/dL 0.78 0.84 0.91  Sodium 135 - 145 mmol/L 148(H) 146(H) 145  Potassium 3.5 - 5.1 mmol/L 4.1 4.5 3.3(L)  Chloride 98 - 111 mmol/L 109 108 106  CO2 22 - 32 mmol/L 30 29 30   Calcium 8.9 - 10.3 mg/dL 9.3 8.9 8.8(L)  Total Protein 6.5 - 8.1 g/dL - - -  Total Bilirubin 0.3 - 1.2 mg/dL - - -  Alkaline Phos 38 - 126 U/L - - -  AST 15 - 41 U/L - - -  ALT 0 - 44 U/L - - -    CBC Latest Ref Rng & Units 09/11/2019 09/10/2019 09/09/2019  WBC 4.0 - 10.5 K/uL 4.6 4.6 3.9(L)  Hemoglobin 12.0 -  15.0 g/dL 7.4(L) 7.6(L) 7.4(L)  Hematocrit 36 - 46 % 26.8(L) 27.7(L) 26.5(L)  Platelets 150 - 400 K/uL 379 314 238    ABG    Component Value Date/Time   PHART 7.522 (H) 09/06/2019 0344   PCO2ART 41.1 09/06/2019 0344   PO2ART 160 (H) 09/06/2019 0344   HCO3 33.7 (H) 09/06/2019 0344   TCO2 35 (H) 09/06/2019 0344   ACIDBASEDEF 1.0 08/15/2019 1944   O2SAT 100.0 09/06/2019 0344    CBG (last 3)  Recent Labs    09/11/19 0007 09/11/19 0344 09/11/19 [place], MD  Internal Medicine Resident-PGY-2 09/11/19 9:03 AM

## 2019-09-11 NOTE — Consult Note (Signed)
[place] Nurse Consult Note: Patient receiving care in [facility] 3M10. Reason for Consult: stage 3 sacrum Wound type: UNSTAGEABLE PI to coccyx Pressure Injury POA: No Measurement: 1.7 cm x 1.7 cm Wound VVO:[id number], dark Drainage (amount, consistency, odor) none Periwound: intact Dressing procedure/placement/frequency: Apply Santyl to the coccyx wound in a nickel thick layer. Cover with a saline moistened gauze, then dry gauze or ABD pad.  Change daily. Monitor the wound area(s) for worsening of condition such as: Signs/symptoms of infection,  Increase in size,  Development of or worsening of odor, Development of pain, or increased pain at the affected locations.  Notify the medical team if any of these develop. Val Riles, RN, MSN, CWOCN, CNS-BC, pager [phone]

## 2019-09-11 NOTE — Progress Notes (Signed)
UPDATE NOTE:  Pt ammendable to tracheostomy given prolonged intubation period. Pt's sister, Jeannene Patella, updated today regarding plans and is agreeable to proceed forward with tracheostomy tomorrow. All questions/concerns addressed and will call to update tomorrow after procedure.  Mitzi Hansen, MD 09/11/19 11:41 AM

## 2019-09-11 NOTE — Plan of Care (Signed)

## 2019-09-11 NOTE — Plan of Care (Signed)
Patient Fentanyl stop and patient has tolerated this well.

## 2019-09-11 NOTE — Progress Notes (Signed)
eLink Physician-Brief Progress Note Patient Name: Chelsea Allison DOB: [birth year] MRN: [id number]   Date of Service  09/11/2019  HPI/Events of Note  Patient had sudden drop in SpO2. Anxious. Requiring FiO2 1.0 now to saturate 92%, which is increased requirement from earlier in evening. Tachypneic. High peak pressures (42 cm H2O). ? Plug.   eICU Interventions  STAT CXR ordered.     Intervention Category Major Interventions: Respiratory failure - evaluation and management  Charlott Rakes 09/11/2019, 6:37 AM

## 2019-09-12 ENCOUNTER — Inpatient Hospital Stay (HOSPITAL_COMMUNITY): Payer: Medicare HMO

## 2019-09-12 LAB — IRON AND TIBC
Iron: 23 ug/dL — ABNORMAL LOW (ref 28–170)
Saturation Ratios: 8 % — ABNORMAL LOW (ref 10.4–31.8)
TIBC: 307 ug/dL (ref 250–450)
UIBC: 284 ug/dL

## 2019-09-12 LAB — GLUCOSE, CAPILLARY
Glucose-Capillary: 109 mg/dL — ABNORMAL HIGH (ref 70–99)
Glucose-Capillary: 122 mg/dL — ABNORMAL HIGH (ref 70–99)
Glucose-Capillary: 122 mg/dL — ABNORMAL HIGH (ref 70–99)
Glucose-Capillary: 127 mg/dL — ABNORMAL HIGH (ref 70–99)
Glucose-Capillary: 136 mg/dL — ABNORMAL HIGH (ref 70–99)
Glucose-Capillary: 154 mg/dL — ABNORMAL HIGH (ref 70–99)

## 2019-09-12 LAB — BASIC METABOLIC PANEL
Anion gap: 12 (ref 5–15)
BUN: 27 mg/dL — ABNORMAL HIGH (ref 8–23)
CO2: 31 mmol/L (ref 22–32)
Calcium: 9.4 mg/dL (ref 8.9–10.3)
Chloride: 103 mmol/L (ref 98–111)
Creatinine, Ser: 0.83 mg/dL (ref 0.44–1.00)
GFR calc Af Amer: >60 mL/min (ref >60)
GFR calc non Af Amer: >60 mL/min (ref >60)
Glucose, Bld: 139 mg/dL — ABNORMAL HIGH (ref 70–99)
Potassium: 3.6 mmol/L (ref 3.5–5.1)
Sodium: 146 mmol/L — ABNORMAL HIGH (ref 135–145)

## 2019-09-12 LAB — CBC
HCT: 27 % — ABNORMAL LOW (ref 36.0–46.0)
Hemoglobin: 7.5 g/dL — ABNORMAL LOW (ref 12.0–15.0)
MCH: 25.4 pg — ABNORMAL LOW (ref 26.0–34.0)
MCHC: 27.8 g/dL — ABNORMAL LOW (ref 30.0–36.0)
MCV: 91.5 fL (ref 80.0–100.0)
Platelets: 481 10*3/uL — ABNORMAL HIGH (ref 150–400)
RBC: 2.95 MIL/uL — ABNORMAL LOW (ref 3.87–5.11)
RDW: 17 % — ABNORMAL HIGH (ref 11.5–15.5)
WBC: 5.3 10*3/uL (ref 4.0–10.5)
nRBC: 0.4 % — ABNORMAL HIGH (ref 0.0–0.2)

## 2019-09-12 LAB — HAPTOGLOBIN: Haptoglobin: 515 mg/dL — ABNORMAL HIGH (ref 37–355)

## 2019-09-12 LAB — FERRITIN: Ferritin: 141 ng/mL (ref 11–307)

## 2019-09-12 LAB — PROTIME-INR
INR: 1.1 (ref 0.8–1.2)
Prothrombin Time: 13.9 seconds (ref 11.4–15.2)

## 2019-09-12 MED ORDER — TICAGRELOR 90 MG PO TABS
90.0000 mg | ORAL_TABLET | Freq: Two times a day (BID) | ORAL | Status: DC
  Administered 2019-09-13 – 2019-09-21 (×17): 90 mg
  Filled 2019-09-12 (×18): qty 1

## 2019-09-12 MED ORDER — POTASSIUM CHLORIDE 20 MEQ/15ML (10%) PO SOLN
40.0000 meq | Freq: Once | ORAL | Status: DC
  Filled 2019-09-12: qty 30

## 2019-09-12 MED ORDER — SODIUM CHLORIDE 0.9 % IV SOLN
510.0000 mg | INTRAVENOUS | Status: AC
  Administered 2019-09-12 – 2019-09-15 (×2): 510 mg via INTRAVENOUS
  Filled 2019-09-12 (×2): qty 17

## 2019-09-12 MED ORDER — METOLAZONE 5 MG PO TABS
5.0000 mg | ORAL_TABLET | Freq: Once | ORAL | Status: DC
  Filled 2019-09-12: qty 1

## 2019-09-12 NOTE — Procedures (Signed)
Cortrak  Person Inserting Tube:  Chelsea Allison, RD Tube Type:  Cortrak - 43 inches Tube Location:  Left nare Initial Placement:  Stomach Secured by: Bridle Technique Used to Measure Tube Placement:  Documented cm marking at nare/ corner of mouth Cortrak Secured At:  71 cm    Cortrak Tube Team Note:  Consult received to place a Cortrak feeding tube.   No x-ray is required. RN may begin using tube.   If the tube becomes dislodged please keep the tube and contact the Cortrak team at [email or web address] (password [email or web address]) for replacement.  If after hours and replacement cannot be delayed, place a NG tube and confirm placement with an abdominal x-ray.    Lockie Pares., RD, LDN, CNSC See AMiON for contact information

## 2019-09-12 NOTE — Evaluation (Signed)
Physical Therapy Evaluation Patient Details Name: Chelsea Allison MRN: [id number] DOB: May 16, 1955 Today's Date: 09/12/2019   History of Present Illness  64 yo female from home with respiratory distress and hypoxia.  Was in hospital from 6/03 to 6/18 for COPD exacerbation, PNA, CAD, acute on chronic systolic CHF.  Required intubation in ER.  Found to have increasing infiltrates on CXR.  Started on ABx.  7/6 tracheostomy.  Clinical Impression  Pt admitted with/for respiratory distress/hypoxia.  Recently trached.  Pt needing min to mod assist from basic mobility due to deconditioning.  Pt currently limited functionally due to the problems listed. ( See problems list.)   Pt will benefit from PT to maximize function and safety in order to get ready for next venue listed below.     Follow Up Recommendations SNF    Equipment Recommendations  None recommended by PT    Recommendations for Other Services       Precautions / Restrictions        Mobility  Bed Mobility Overal bed mobility: Needs Assistance Bed Mobility: Supine to Sit;Sit to Supine     Supine to sit: Min guard Sit to supine: Min assist;+2 for physical assistance      Transfers Overall transfer level: Needs assistance   Transfers: Sit to/from Stand Sit to Stand: Min assist;Mod assist;From elevated surface (mod first trial with submaximal stance)            Ambulation/Gait             General Gait Details: NT today  Stairs            Wheelchair Mobility    Modified Rankin (Stroke Patients Only)       Balance Overall balance assessment: Needs assistance Sitting-balance support: Feet supported Sitting balance-Leahy Scale: Fair     Standing balance support: Single extremity supported;Bilateral upper extremity supported;During functional activity Standing balance-Leahy Scale: Poor Standing balance comment: reliant on external support                             Pertinent  Vitals/Pain Pain Assessment: 0-10 Pain Score: 8  Pain Location: new trach site Pain Descriptors / Indicators: Sore;Throbbing Pain Intervention(s): Monitored during session    Home Living Family/patient expects to be discharged to:: Private residence Living Arrangements: Alone Available Help at Discharge: Family;Available PRN/intermittently Type of Home: Apartment Home Access: Level entry     Home Layout: One level Home Equipment: Walker - 2 wheels;Cane - single point;Bedside commode;Tub bench Additional Comments: reports tub bench doesn't fit in bathroom    Prior Function Level of Independence: Independent         Comments: family or friend does grocery shopping     Hand Dominance   Dominant Hand: Right    Extremity/Trunk Assessment   Upper Extremity Assessment Upper Extremity Assessment: Generalized weakness    Lower Extremity Assessment Lower Extremity Assessment: Generalized weakness (Left weaker than R LE)       Communication   Communication: No difficulties  Cognition Arousal/Alertness: Awake/alert Behavior During Therapy: WFL for tasks assessed/performed Overall Cognitive Status: Within Functional Limits for tasks assessed                                        General Comments General comments (skin integrity, edema, etc.): sats on vent at 70% FiO2  were >98%  Exercises     Assessment/Plan    PT Assessment Patient needs continued PT services  PT Problem List Decreased strength;Decreased activity tolerance;Decreased balance;Decreased mobility;Cardiopulmonary status limiting activity;Pain       PT Treatment Interventions DME instruction;Gait training;Functional mobility training;Therapeutic activities;Therapeutic exercise;Balance training;Patient/family education    PT Goals (Current goals can be found in the Care Plan section)  Acute Rehab PT Goals Patient Stated Goal: go home PT Goal Formulation: With patient Time For Goal  Achievement: 09/25/20 Potential to Achieve Goals: Good    Frequency Min 3X/week   Barriers to discharge Decreased caregiver support      Co-evaluation               AM-PAC PT "6 Clicks" Mobility  Outcome Measure Help needed turning from your back to your side while in a flat bed without using bedrails?: A Little Help needed moving from lying on your back to sitting on the side of a flat bed without using bedrails?: A Little Help needed moving to and from a bed to a chair (including a wheelchair)?: A Lot Help needed standing up from a chair using your arms (e.g., wheelchair or bedside chair)?: A Lot Help needed to walk in hospital room?: A Lot Help needed climbing 3-5 steps with a railing? : A Lot 6 Click Score: 14    End of Session Equipment Utilized During Treatment: Oxygen Activity Tolerance: Patient tolerated treatment well;Patient limited by fatigue Patient left: in bed;with call bell/phone within reach;with bed alarm set Nurse Communication: Mobility status PT Visit Diagnosis: Muscle weakness (generalized) (M62.81);Unsteadiness on feet (R26.81);Difficulty in walking, not elsewhere classified (R26.2)    Time: 4431-5400 PT Time Calculation (min) (ACUTE ONLY): 31 min   Charges:   PT Evaluation $PT Eval Moderate Complexity: 1 Mod PT Treatments $Therapeutic Activity: 8-22 mins        09/12/2019  [name], PT Acute Rehabilitation Services [phone]  (pager) [phone]  (office)  Tessie Fass Takako Minckler 09/12/2019, 4:12 PM

## 2019-09-12 NOTE — Procedures (Signed)
Percutaneous Tracheostomy Procedure Note   Chelsea Allison  [id number]  [birth year]  Date:09/12/19  Time:9:04 AM   Provider Performing:Haroon Shatto C Tamala Julian  Procedure: Percutaneous Tracheostomy with Bronchoscopic Guidance ([id number])  Indication(s) Persistent respiratory failure  Consent Risks of the procedure as well as the alternatives and risks of each were explained to the patient and/or caregiver.  Consent for the procedure was obtained.  Anesthesia Etomidate, Versed, Fentanyl, Vecuronium   Time Out Verified patient identification, verified procedure, site/side was marked, verified correct patient position, special equipment/implants available, medications/allergies/relevant history reviewed, required imaging and test results available.   Sterile Technique Maximal sterile technique including sterile barrier drape, hand hygiene, sterile gown, sterile gloves, mask, hair covering.    Procedure Description Appropriate anatomy identified by palpation.  Patient's neck prepped and draped in sterile fashion.  1% lidocaine with epinephrine was used to anesthetize skin overlying neck.  1.5cm incision made and blunt dissection performed until tracheal rings could be easily palpated.   Then a size 6-0 Shiley tracheostomy was placed under bronchoscopic visualization using usual Seldinger technique and serial dilation.   Bronchoscope confirmed placement above the carina.  Tracheostomy was sutured in place with adhesive pad to protect skin under pressure.    Patient connected to ventilator.   Complications/Tolerance None; patient tolerated the procedure well. Chest X-ray is ordered to confirm no post-procedural complication.   EBL Minimal   Specimen(s) None

## 2019-09-12 NOTE — Progress Notes (Signed)
NAME:  Chelsea Allison, MRN:  [id number], DOB:  [birth year], LOS: 7 ADMISSION DATE:  09/05/2019, CONSULTATION DATE:  09/05/19 REFERRING MD:  Stark Jock- EM, CHIEF COMPLAINT:  Respiratory Distress, AMS  Brief History   64 yo female smoker presented to ED with altered mental status from acute on chronic hypoxic/hypercapnic respiratory failure 2nd to HCAP in setting of COPD.  Past Medical History  NSCLS s/p partial resection Anal cancer s/p colostomy CAD with MI s/p PCI COPD on home O2 HFrEF Tobacco abuse  Acute on chronic respiratory failure requiring intubation, repeated   Significant Hospital Events   6/29 admitted to [facility]  7/1 hgb drop.  7/2 SBTx4h 7/4 only tolerated 59min on pressure support. Starting precedex/klonapin. Obtaining hemolytic labs.  Procedures:  6/29 ETT>   Significant Diagnostic Tests:  CXR 7/2: appropriate positioning of ETT, NG tubes, no acute overlying lung processes noted   Micro Data:  6/29 SARS Cov2>negative 6/29 tracheal aspirate>  6/29 UCx> less than 10,000 colonies insignificant growth 6/29 BCx> 6/29 MRSA PCR>negative  Antimicrobials:  6/29 vancomycin>6/30 6/29 pip/tazo> 7/3  Interim history/subjective:  No events. Tearful about trach. Asking same questions as yesterday. All were answered.  Objective   Blood pressure (!) 104/59, pulse 80, temperature 98.5 F (36.9 C), temperature source Oral, resp. rate (!) 27, height 5\' 9"  (1.753 m), weight 91.8 kg, SpO2 100 %.    Vent Mode: PRVC FiO2 (%):  [40 %] 40 % Set Rate:  [18 bmp] 18 bmp Vt Set:  [520 mL] 520 mL PEEP:  [5 cmH20] 5 cmH20 Pressure Support:  [12 cmH20] 12 cmH20 Plateau Pressure:  [24 cmH20-33 cmH20] 33 cmH20   Intake/Output Summary (Last 24 hours) at 09/12/2019 0826 Last data filed at 09/12/2019 0600 Gross per 24 hour  Intake 1683.63 ml  Output 2025 ml  Net -341.37 ml   Filed Weights   09/10/19 0500 09/11/19 0414 09/12/19 0319  Weight: 89.1 kg 90.6 kg 91.8 kg     Examination:  General: chronically ill appearing female HEENT: ETT in place minimal secretions Neuro:alert and oriented CV: tachycardic rate, regular rhythm. No LE edema. Extremities warm PULM: inspiratory and expiratory fine crackles R>L GI: nondistended. Liquid stool in ostomy bag. abd soft. Diffuse tenderness to palpation. bs present Extremities: moving all 4 extremities Skin: no rashes   Assessment & Plan:   Acute on chronic hypercapnic hypoxic respiratory failure requiring mechanical ventilation 2/2 HCAP, complicated by severe anxiety- intubated 5 times in past few months. Lurline Idol today after prolonged discussion - Hopefully quick wean thereafter - s/p Zosyn x 5 days   Chronic anxiety. Continue klonapin and precedex  Chronic HFrEF. Up 5L from admission. Does not appear grossly volume overloaded. Plan:  Dose of metolazone today, try to keep sodium from further climbing, strict I/O. History of CAD. Continue statin.  Brilinta can resume tomorrow.  Normocytic Anemia.  Stable from yesterday however does appear to have had a fairly significant drop from 6/30.  Unclear source of hgb drop. LDH only minimally elevated. Haptoglobin still in process. RPI 1.5 indicating a hypoproliferation component as well however would not expect this to have caused a sudden drop over a day.  Plan - Stable, labs c/w iron def anemia, giving IV iron  DM type 2. Blood sugars stable. Continue q4h 3U novolog  Chronic cancer related pain. Patient relaying fairly well pain control at this time. Currently on fentanyl drip as well as buprenorphine.   Hx NSCLC s/p partial resection XLT. Hx anal cancer  s/p colectomy.  High risk malnutrition. Continue tube feeds.   Best practice:  Diet: tube feeds DVT prophylaxis: lovenox GI prophylaxis: protonix Mobility: bed rest Code Status: Full  Disposition:  ICU Family update: updated patient  The patient is critically ill with multiple organ systems failure  and requires high complexity decision making for assessment and support, frequent evaluation and titration of therapies, application of advanced monitoring technologies and extensive interpretation of multiple databases. Critical Care Time devoted to patient care services described in this note independent of APP/resident time (if applicable)  is 35 minutes.   Erskine Emery MD [facility] 09/12/2019 8:30 AM Personal pager: [phone] If unanswered, please page CCM On-call: [phone]

## 2019-09-12 NOTE — Progress Notes (Signed)
SLP Cancellation Note  Patient Details Name: Ja Pistole MRN: [id number] DOB: [birth year]   Cancelled treatment: Orders received for PMV/swallow evaluations. Pt underwent trach procedure this am; remains on vent. Will follow along for readiness.  Ayyan Sites L. Tivis Ringer, [place] CCC/SLP Acute Rehabilitation Services Office number [phone] Pager [phone]           Juan Quam Laurice 09/12/2019, 10:37 AM

## 2019-09-12 NOTE — Procedures (Signed)
Diagnostic Bronchoscopy Procedure Note  Chelsea Allison  [id number]  Dec 19, 1955  Date:09/12/19  Time:9:08 AM   Provider Performing:Helton Oleson W Heber Tracy  Procedure: Diagnostic Bronchoscopy [phone])  Indication(s) Assist with direct visualization of tracheostomy placement  Consent Risks of the procedure as well as the alternatives and risks of each were explained to the patient and/or caregiver.  Consent for the procedure was obtained.   Anesthesia See separate tracheostomy note   Time Out Verified patient identification, verified procedure, site/side was marked, verified correct patient position, special equipment/implants available, medications/allergies/relevant history reviewed, required imaging and test results available.   Sterile Technique Usual hand hygiene, masks, gowns, and gloves were used   Procedure Description Bronchoscope advanced through endotracheal tube and into airway.  After suctioning out tracheal secretions, bronchoscope used to provide direct visualization of tracheostomy placement. No damage to the posterior wall of the trachea was observed.    Complications/Tolerance None; patient tolerated the procedure well..   EBL Minimal   Specimen(s) None   Georgann Housekeeper, AGACNP-BC [place] for personal pager PCCM on call pager [phone]  09/12/2019 9:09 AM

## 2019-09-13 LAB — BASIC METABOLIC PANEL
Anion gap: 9 (ref 5–15)
Anion gap: 11 (ref 5–15)
BUN: 27 mg/dL — ABNORMAL HIGH (ref 8–23)
BUN: 27 mg/dL — ABNORMAL HIGH (ref 8–23)
CO2: 33 mmol/L — ABNORMAL HIGH (ref 22–32)
CO2: 28 mmol/L (ref 22–32)
Calcium: 9.4 mg/dL (ref 8.9–10.3)
Calcium: 9.2 mg/dL (ref 8.9–10.3)
Chloride: 103 mmol/L (ref 98–111)
Chloride: 106 mmol/L (ref 98–111)
Creatinine, Ser: 0.69 mg/dL (ref 0.44–1.00)
Creatinine, Ser: 0.73 mg/dL (ref 0.44–1.00)
GFR calc Af Amer: >60 mL/min (ref >60)
GFR calc Af Amer: >60 mL/min (ref >60)
GFR calc non Af Amer: >60 mL/min (ref >60)
GFR calc non Af Amer: >60 mL/min (ref >60)
Glucose, Bld: 148 mg/dL — ABNORMAL HIGH (ref 70–99)
Glucose, Bld: 141 mg/dL — ABNORMAL HIGH (ref 70–99)
Potassium: 2.8 mmol/L — ABNORMAL LOW (ref 3.5–5.1)
Potassium: 3.5 mmol/L (ref 3.5–5.1)
Sodium: 145 mmol/L (ref 135–145)
Sodium: 145 mmol/L (ref 135–145)

## 2019-09-13 LAB — CBC
HCT: 26.5 % — ABNORMAL LOW (ref 36.0–46.0)
Hemoglobin: 7.4 g/dL — ABNORMAL LOW (ref 12.0–15.0)
MCH: 25.5 pg — ABNORMAL LOW (ref 26.0–34.0)
MCHC: 27.9 g/dL — ABNORMAL LOW (ref 30.0–36.0)
MCV: 91.4 fL (ref 80.0–100.0)
Platelets: 554 10*3/uL — ABNORMAL HIGH (ref 150–400)
RBC: 2.9 MIL/uL — ABNORMAL LOW (ref 3.87–5.11)
RDW: 16.5 % — ABNORMAL HIGH (ref 11.5–15.5)
WBC: 4.8 10*3/uL (ref 4.0–10.5)
nRBC: 0.8 % — ABNORMAL HIGH (ref 0.0–0.2)

## 2019-09-13 LAB — GLUCOSE, CAPILLARY
Glucose-Capillary: 146 mg/dL — ABNORMAL HIGH (ref 70–99)
Glucose-Capillary: 174 mg/dL — ABNORMAL HIGH (ref 70–99)
Glucose-Capillary: 135 mg/dL — ABNORMAL HIGH (ref 70–99)
Glucose-Capillary: 151 mg/dL — ABNORMAL HIGH (ref 70–99)
Glucose-Capillary: 142 mg/dL — ABNORMAL HIGH (ref 70–99)
Glucose-Capillary: 159 mg/dL — ABNORMAL HIGH (ref 70–99)

## 2019-09-13 LAB — MAGNESIUM: Magnesium: 2.4 mg/dL (ref 1.7–2.4)

## 2019-09-13 MED ORDER — OXYCODONE HCL 5 MG PO TABS: 10.0000 mg | ORAL_TABLET | ORAL | Status: DC | PRN

## 2019-09-13 MED ORDER — MIDAZOLAM HCL 2 MG/2ML IJ SOLN
1.0000 mg | Freq: Four times a day (QID) | INTRAMUSCULAR | Status: DC | PRN
  Administered 2019-09-14 – 2019-09-15 (×3): 1 mg via INTRAVENOUS
  Filled 2019-09-13 (×3): qty 2

## 2019-09-13 MED ORDER — CLONAZEPAM 0.5 MG PO TABS: 0.5000 mg | ORAL_TABLET | Freq: Three times a day (TID) | ORAL | Status: DC

## 2019-09-13 MED ORDER — DEXMEDETOMIDINE HCL IN NACL 400 MCG/100ML IV SOLN
0.0000 ug/kg/h | INTRAVENOUS | Status: DC
  Administered 2019-09-13: 0.898 ug/kg/h via INTRAVENOUS
  Administered 2019-09-13 – 2019-09-15 (×10): 1.2 ug/kg/h via INTRAVENOUS
  Administered 2019-09-15: 0.8 ug/kg/h via INTRAVENOUS
  Filled 2019-09-13 (×9): qty 100

## 2019-09-13 MED ORDER — POTASSIUM CHLORIDE 20 MEQ PO PACK
40.0000 meq | PACK | ORAL | Status: AC
  Administered 2019-09-13 – 2019-09-14 (×4): 40 meq
  Filled 2019-09-13 (×4): qty 2

## 2019-09-13 MED ORDER — OXYCODONE HCL 5 MG PO TABS: 5.0000 mg | ORAL_TABLET | ORAL | Status: DC | PRN

## 2019-09-13 MED ORDER — CLONAZEPAM 1 MG PO TABS
1.0000 mg | ORAL_TABLET | Freq: Three times a day (TID) | ORAL | Status: DC
  Administered 2019-09-13 – 2019-09-14 (×4): 1 mg
  Filled 2019-09-13 (×4): qty 1

## 2019-09-13 MED ORDER — OXYCODONE HCL 5 MG PO TABS
5.0000 mg | ORAL_TABLET | ORAL | Status: DC
  Administered 2019-09-13 – 2019-09-14 (×7): 5 mg via ORAL
  Filled 2019-09-13 (×7): qty 1

## 2019-09-13 NOTE — Evaluation (Signed)
Occupational Therapy Evaluation Patient Details Name: Chelsea Allison MRN: [id number] DOB: May 09, 1955 Today's Date: 09/13/2019    History of Present Illness 64 yo female from home with respiratory distress and hypoxia.  Was in hospital from 6/03 to 6/18 for COPD exacerbation, PNA, CAD, acute on chronic systolic CHF.  Required intubation in ER.  Found to have increasing infiltrates on CXR.  Started on ABx.  7/6 tracheostomy.   Clinical Impression   PTA, pt was living at home alone, she reports she was independent with ADL and has assistance with grocery shopping from family/friends. Pt reports she was independent with functional mobility. Pt currently requires minA for bed mobility and modA to stand from EOB, pt declined taking steps this date. Pt requires modA for LB dressing. Due to decline in current level of function, pt would benefit from acute OT to address established goals to facilitate safe D/C to venue listed below. At this time, recommend SNF follow-up. Will continue to follow acutely.     Follow Up Recommendations  SNF;Supervision/Assistance - 24 hour    Equipment Recommendations  Tub/shower seat    Recommendations for Other Services       Precautions / Restrictions Precautions Precautions: Fall Precaution Comments: monitor pulses and sats to walk Restrictions Weight Bearing Restrictions: No      Mobility Bed Mobility Overal bed mobility: Needs Assistance Bed Mobility: Supine to Sit;Sit to Supine     Supine to sit: Min assist;HOB elevated Sit to supine: Min assist;HOB elevated   General bed mobility comments: minA to progress hips and trunk to EOB;minA to return to supine   Transfers Overall transfer level: Needs assistance Equipment used:  (face to face) Transfers: Sit to/from Stand Sit to Stand: Mod assist         General transfer comment: modA to stand from EOB x1, pt stood for min and 1/2, pt declined attempt to side step toward head of bed    Balance  Overall balance assessment: Needs assistance Sitting-balance support: Feet supported Sitting balance-Leahy Scale: Fair Sitting balance - Comments: able to sit at toilet and eOB without assist   Standing balance support: Bilateral upper extremity supported Standing balance-Leahy Scale: Poor Standing balance comment: reliant on external support                           ADL either performed or assessed with clinical judgement   ADL Overall ADL's : Needs assistance/impaired Eating/Feeding: NPO   Grooming: Set up;Sitting;Wash/dry face;Wash/dry hands Grooming Details (indicate cue type and reason): seated EOB Upper Body Bathing: Minimal assistance;Sitting   Lower Body Bathing: Moderate assistance;Sit to/from stand   Upper Body Dressing : Minimal assistance;Sitting   Lower Body Dressing: Moderate assistance;Sit to/from stand Lower Body Dressing Details (indicate cue type and reason): sit<>stand from EOB, able to access ankles, assist to access feet   Toilet Transfer Details (indicate cue type and reason): deferred Toileting- Clothing Manipulation and Hygiene: Maximal assistance Toileting - Clothing Manipulation Details (indicate cue type and reason): clinical judgement     Functional mobility during ADLs: Moderate assistance General ADL Comments: session limited to standing from EOB, pt declined taking steps along edge of bed;educated pt on use of progressive muscle relaxation (PMR) to ease anxiety;began education on energy conservation strategies      Vision Baseline Vision/History: Wears glasses Patient Visual Report: No change from baseline Additional Comments: pt unable to locate glasses, reports no change in baseline     Perception  Praxis      Pertinent Vitals/Pain Pain Assessment: 0-10 Pain Score: 5  Pain Location: new trach site Pain Descriptors / Indicators: Sore;Throbbing Pain Intervention(s): Monitored during session;Limited activity within  patient's tolerance;Patient requesting pain meds-RN notified;Premedicated before session     Hand Dominance Right   Extremity/Trunk Assessment Upper Extremity Assessment Upper Extremity Assessment: Generalized weakness   Lower Extremity Assessment Lower Extremity Assessment: Generalized weakness;Defer to PT evaluation   Cervical / Trunk Assessment Cervical / Trunk Assessment: Kyphotic   Communication Communication Communication: No difficulties   Cognition Arousal/Alertness: Awake/alert Behavior During Therapy: WFL for tasks assessed/performed Overall Cognitive Status: Within Functional Limits for tasks assessed                                 General Comments: pt communicating through writing on paper   General Comments  sats on vent at 30%FiO2 were >93%     Exercises     Shoulder Instructions      Home Living Family/patient expects to be discharged to:: Private residence Living Arrangements: Alone Available Help at Discharge: Family;Available PRN/intermittently Type of Home: Apartment Home Access: Level entry     Home Layout: One level     Bathroom Shower/Tub: Teacher, early years/pre: Standard     Home Equipment: Environmental consultant - 2 wheels;Cane - single point;Bedside commode;Tub bench   Additional Comments: reports tub bench doesn't fit in bathroom      Prior Functioning/Environment Level of Independence: Independent        Comments: family or friend does grocery shopping        OT Problem List: Decreased strength;Decreased range of motion;Decreased activity tolerance;Impaired balance (sitting and/or standing);Cardiopulmonary status limiting activity;Decreased knowledge of use of DME or AE      OT Treatment/Interventions: Self-care/ADL training;Therapeutic exercise;Energy conservation;DME and/or AE instruction;Therapeutic activities;Patient/family education;Balance training    OT Goals(Current goals can be found in the care plan  section) Acute Rehab OT Goals Patient Stated Goal: go home OT Goal Formulation: With patient Time For Goal Achievement: 09/27/19 Potential to Achieve Goals: Good ADL Goals Pt Will Perform Grooming: standing;with min assist Pt Will Perform Lower Body Dressing: sit to/from stand;with adaptive equipment;with min guard assist Pt Will Transfer to Toilet: with min guard assist;stand pivot transfer Additional ADL Goal #1: Pt will demonstrate independence with 3 energy conservation strategies during ADL/IADL completion.  OT Frequency: Min 2X/week   Barriers to D/C: Decreased caregiver support  pt lives alone       Co-evaluation              AM-PAC OT "6 Clicks" Daily Activity     Outcome Measure Help from another person eating meals?: Total (NPO) Help from another person taking care of personal grooming?: A Little Help from another person toileting, which includes using toliet, bedpan, or urinal?: A Little Help from another person bathing (including washing, rinsing, drying)?: A Lot Help from another person to put on and taking off regular upper body clothing?: A Little Help from another person to put on and taking off regular lower body clothing?: A Lot 6 Click Score: 14   End of Session Equipment Utilized During Treatment: Oxygen;Gait belt Nurse Communication: Mobility status  Activity Tolerance: Patient limited by fatigue;Patient tolerated treatment well Patient left: in bed;with call bell/phone within reach;with bed alarm set  OT Visit Diagnosis: Muscle weakness (generalized) (M62.81);Unsteadiness on feet (R26.81)  Time: 1552-0802 OT Time Calculation (min): 38 min Charges:  OT General Charges $OT Visit: 1 Visit OT Evaluation $OT Eval Moderate Complexity: 1 Mod OT Treatments $Self Care/Home Management : 23-37 mins  Helene Kelp OTR/L Acute Rehabilitation Services Office: [place] 09/13/2019, 11:17 AM

## 2019-09-13 NOTE — Progress Notes (Signed)
NAME:  Chelsea Allison, MRN:  [id number], DOB:  June 16, 1955, LOS: 8 ADMISSION DATE:  09/05/2019, CONSULTATION DATE:  09/05/19 REFERRING MD:  Stark Jock- EM, CHIEF COMPLAINT:  Respiratory Distress, AMS  Brief History   64 yo female smoker presented to ED with altered mental status from acute on chronic hypoxic/hypercapnic respiratory failure 2nd to HCAP in setting of COPD.  Past Medical History  NSCLS s/p partial resection Anal cancer s/p colostomy CAD with MI s/p PCI COPD on home O2 HFrEF Tobacco abuse  Acute on chronic respiratory failure requiring intubation, repeated   Significant Hospital Events   6/29 admitted to [facility]  7/1 hgb drop.  7/2 SBTx4h 7/4 only tolerated 71min on pressure support. Starting precedex/klonapin. Obtaining hemolytic labs. 7/6 trached  Procedures:  ETT 6/29-7/6 Tracheostomy 7/6  Significant Diagnostic Tests:  CXR 7/2: appropriate positioning of ETT, NG tubes, no acute overlying lung processes noted  CXR 7/6: new patchy left lung base opacity which could represent atelectasis vs aspiration vs pna, tracheostomy in place  Micro Data:  6/29 SARS Cov2>negative 6/29 tracheal aspirate>  6/29 UCx> less than 10,000 colonies insignificant growth 6/29 BCx> NG 6/29 MRSA PCR>negative  Antimicrobials:  6/29 vancomycin>6/30 6/29 pip/tazo> 7/3  Interim history/subjective:  No significant overnight events. Pt asking about when she can eat, drink and talk. Discussed that we will need to work towards weaning off the vent first.  Objective   Blood pressure 108/61, pulse 77, temperature 99.5 F (37.5 C), temperature source Oral, resp. rate (!) 21, height 5\' 9"  (1.753 m), weight 86.5 kg, SpO2 100 %.    Vent Mode: PRVC FiO2 (%):  [30 %-70 %] 30 % Set Rate:  [18 bmp-21 bmp] 21 bmp Vt Set:  [520 mL] 520 mL PEEP:  [5 cmH20] 5 cmH20 Plateau Pressure:  [24 cmH20-33 cmH20] 24 cmH20   Intake/Output Summary (Last 24 hours) at 09/13/2019 0752 Last data filed at 09/13/2019  0600 Gross per 24 hour  Intake 1944.85 ml  Output 750 ml  Net 1194.85 ml   Filed Weights   09/11/19 0414 09/12/19 0319 09/13/19 0500  Weight: 90.6 kg 91.8 kg 86.5 kg    Examination:  General: chronically ill appearing female HEENT: Tracheostomy.  Neuro:alert and oriented CV: tachycardic rate, regular rhythm. No LE edema. Extremities warm PULM: PRVC PEEP 5 FiO2 30%. Bilateral crackles/rhonchi/upper respiratory sounds GI: nondistended. bs active Extremities: moving all 4 extremities Skin: no rashes   Assessment & Plan:   Acute on chronic hypercapnic hypoxic respiratory failure requiring mechanical ventilation 2/2 HCAP, complicated by severe anxiety- intubated 5 times in past few months. Received 5d course of zosyn for HCAP S/p tracheostomy 7/6 for inability to wean off vent and repeated intubations. Remains on minimal vent settings Plan: will work on weaning the vent today. Goal would be to get to trach to trach collar in a day or two if she is tolerating wean.   Chronic anxiety. Currently on 1.44mcg of precedex. We will need to work on weaning this down but will increase klonapin to 1mg  tid and hopefully be able to wean down the precedex accordingly. Will decrease versed frequency to q6h.  Chronic HFrEF. Up 3L from admission. Does not appear grossly volume overloaded. History of CAD. Continue statin.  Brilinta can resume tomorrow.  Iron deficient Anemia.  Stable. No obvious source of bleeding.  DM type 2. Blood sugars stable. Continue q4h 3U novolog  Chronic cancer related pain. Patient relaying fairly well pain control at this time. Currently has  buprenorphine patch and fentanyl infusion @150mcg . Will transition her off fentanyl to oxycodone 10mg  q4h prn. Prn fentanyl for breakthrough pain.  Hx NSCLC s/p partial resection XLT. Hx anal cancer s/p colectomy.  High risk malnutrition. Continue tube feeds.   Best practice:  Diet: tube feeds DVT prophylaxis: lovenox GI  prophylaxis: protonix Mobility: bed rest Code Status: Full  Disposition:  ICU Family update: updated patient  Mitzi Hansen, MD 09/13/19 9:04 AM

## 2019-09-14 ENCOUNTER — Inpatient Hospital Stay (HOSPITAL_COMMUNITY): Payer: Medicare HMO

## 2019-09-14 LAB — BASIC METABOLIC PANEL
Anion gap: 9 (ref 5–15)
BUN: 24 mg/dL — ABNORMAL HIGH (ref 8–23)
CO2: 30 mmol/L (ref 22–32)
Calcium: 9.4 mg/dL (ref 8.9–10.3)
Chloride: 107 mmol/L (ref 98–111)
Creatinine, Ser: 0.81 mg/dL (ref 0.44–1.00)
GFR calc Af Amer: >60 mL/min (ref >60)
GFR calc non Af Amer: >60 mL/min (ref >60)
Glucose, Bld: 159 mg/dL — ABNORMAL HIGH (ref 70–99)
Potassium: 3.7 mmol/L (ref 3.5–5.1)
Sodium: 146 mmol/L — ABNORMAL HIGH (ref 135–145)

## 2019-09-14 LAB — GLUCOSE, CAPILLARY
Glucose-Capillary: 130 mg/dL — ABNORMAL HIGH (ref 70–99)
Glucose-Capillary: 135 mg/dL — ABNORMAL HIGH (ref 70–99)
Glucose-Capillary: 137 mg/dL — ABNORMAL HIGH (ref 70–99)
Glucose-Capillary: 148 mg/dL — ABNORMAL HIGH (ref 70–99)
Glucose-Capillary: 150 mg/dL — ABNORMAL HIGH (ref 70–99)
Glucose-Capillary: 159 mg/dL — ABNORMAL HIGH (ref 70–99)

## 2019-09-14 LAB — CBC
HCT: 26.1 % — ABNORMAL LOW (ref 36.0–46.0)
Hemoglobin: 7.2 g/dL — ABNORMAL LOW (ref 12.0–15.0)
MCH: 25.3 pg — ABNORMAL LOW (ref 26.0–34.0)
MCHC: 27.6 g/dL — ABNORMAL LOW (ref 30.0–36.0)
MCV: 91.6 fL (ref 80.0–100.0)
Platelets: 564 10*3/uL — ABNORMAL HIGH (ref 150–400)
RBC: 2.85 MIL/uL — ABNORMAL LOW (ref 3.87–5.11)
RDW: 17.1 % — ABNORMAL HIGH (ref 11.5–15.5)
WBC: 5.7 10*3/uL (ref 4.0–10.5)
nRBC: 1.4 % — ABNORMAL HIGH (ref 0.0–0.2)

## 2019-09-14 MED ORDER — FREE WATER
200.0000 mL | Freq: Four times a day (QID) | Status: DC
  Administered 2019-09-14 – 2019-09-19 (×19): 200 mL

## 2019-09-14 MED ORDER — OXYCODONE HCL 5 MG PO TABS
10.0000 mg | ORAL_TABLET | ORAL | Status: DC
  Administered 2019-09-14 – 2019-09-19 (×28): 10 mg
  Filled 2019-09-14 (×27): qty 2

## 2019-09-14 MED ORDER — FUROSEMIDE 10 MG/ML IJ SOLN
40.0000 mg | Freq: Two times a day (BID) | INTRAMUSCULAR | Status: AC
  Administered 2019-09-14 – 2019-09-16 (×4): 40 mg via INTRAVENOUS
  Filled 2019-09-14 (×4): qty 4

## 2019-09-14 MED ORDER — CLONAZEPAM 1 MG PO TABS
2.0000 mg | ORAL_TABLET | Freq: Three times a day (TID) | ORAL | Status: DC
  Administered 2019-09-14 – 2019-09-17 (×11): 2 mg
  Filled 2019-09-14 (×11): qty 2

## 2019-09-14 MED ORDER — OXYCODONE HCL 5 MG PO TABS
10.0000 mg | ORAL_TABLET | ORAL | Status: DC
  Administered 2019-09-14: 10 mg via ORAL
  Filled 2019-09-14 (×2): qty 2

## 2019-09-14 NOTE — Progress Notes (Signed)
Nutrition Follow-up  DOCUMENTATION CODES:   Not applicable  INTERVENTION:   Tube Feeding via Cortrak:  Vital 1.5 at 60 ml/hr Pro-Stat 30 mL daily Provides 112 g of protein, 2260 kcals and 1094 mL of free water  Continue Vitamin C 250 mg BID and Zinc Sulfate 220 mg x 30 days for wound healing  Hypernatremic; Add Free Water flushes 200 mL q 6 hours   NUTRITION DIAGNOSIS:   Inadequate oral intake related to acute illness as evidenced by NPO status.  Being addressed via TF   GOAL:   Patient will meet greater than or equal to 90% of their needs  Progressing   MONITOR:   Vent status, Diet advancement, Labs, Weight trends, Skin  REASON FOR ASSESSMENT:   Consult, Ventilator Enteral/tube feeding initiation and management  ASSESSMENT:   64 yo female admitted with acute on chronic respiratory failure from HCAP requiring intubation PMH includes NSCLS s/p partial resection, anal cancer s/p colostomy, acute on chronic respiratory failure requiring repeat intubations, CHF, COPD, CAD, MI  6/29 Admitted, Intubated 7/06 Lurline Idol, Cortrak placed  Pt remains on vent support via trach, weaning to trach collar as able Plan for PMV and eventual diet as able per SLP  Vital 1.5 at 60 ml/hr, Pro-Stat 30 mL daily via Cortrak  Weight has been relatively stable  Labs: sodium 146 (H),  Meds: lasix, ss novolog, miralax, KCl, B-12, VitD, zinc sulfate, Vit C  Diet Order:   Diet Order            Diet NPO time specified  Diet effective midnight                 EDUCATION NEEDS:   Not appropriate for education at this time  Skin:  Skin Assessment: Skin Integrity Issues: Skin Integrity Issues:: Stage III Stage III: sacrum  Last BM:  7/8 colostomy  Height:   Ht Readings from Last 1 Encounters:  09/05/19 5\' 9"  (1.753 m)    Weight:   Wt Readings from Last 1 Encounters:  09/13/19 86.5 kg    BMI:  Body mass index is 28.16 kg/m.  Estimated Nutritional Needs:   Kcal:   2000-2300 kcals  Protein:  100-120 g  Fluid:  >/= 2 L   Kerman Passey MS, RDN, LDN, CNSC Registered Dietitian III Clinical Nutrition RD Pager and On-Call Pager Number Located in [place]

## 2019-09-14 NOTE — Progress Notes (Signed)
Physical Therapy Treatment Patient Details Name: Chelsea Allison MRN: [id number] DOB: 1955-10-30 Today's Date: 09/14/2019    History of Present Illness 64 yo female from home with respiratory distress and hypoxia.  Was in hospital from 6/03 to 6/18 for COPD exacerbation, PNA, CAD, acute on chronic systolic CHF.  Required intubation in ER.  Found to have increasing infiltrates on CXR.  Started on ABx.  7/6 tracheostomy.    PT Comments    Pt needed a little encouragement and time to warm up to participating.  Repetitive cues to keep eyes open.  Emphasis on sit to standing and pre gait activity at EOB due to no 2 person assist available today.    Follow Up Recommendations  SNF     Equipment Recommendations  None recommended by PT    Recommendations for Other Services       Precautions / Restrictions Precautions Precautions: Fall    Mobility  Bed Mobility Overal bed mobility: Needs Assistance Bed Mobility: Supine to Sit;Sit to Supine     Supine to sit: Min assist;HOB elevated Sit to supine: Mod assist   General bed mobility comments: assist up via R elbow, assist LE's, minimal assist scooting to EOB  Transfers Overall transfer level: Needs assistance Equipment used: Rolling walker (2 wheeled);None Transfers: Sit to/from Stand Sit to Stand: Mod assist         General transfer comment: 4 trials of standing 2 in the RW and 2 with face to face assist  Ambulation/Gait                 Stairs             Wheelchair Mobility    Modified Rankin (Stroke Patients Only)       Balance Overall balance assessment: Needs assistance Sitting-balance support: Feet supported Sitting balance-Leahy Scale: Fair Sitting balance - Comments: able to balance at EOB on low air loss mattress     Standing balance-Leahy Scale: Poor Standing balance comment: pt worked on w/shifting and unweighting each side with small steps only today.                             Cognition Arousal/Alertness: Awake/alert Behavior During Therapy: WFL for tasks assessed/performed Overall Cognitive Status: Difficult to assess                                 General Comments: pt communicating through writing on paper      Exercises      General Comments General comments (skin integrity, edema, etc.): sats 99% on 30% FiO2 vent support, RR 34 as pt was a little anxious.      Pertinent Vitals/Pain Pain Assessment: Faces Faces Pain Scale: Hurts even more Pain Location: new trach site Pain Descriptors / Indicators: Sore;Throbbing Pain Intervention(s): Monitored during session    Home Living                      Prior Function            PT Goals (current goals can now be found in the care plan section) Acute Rehab PT Goals Patient Stated Goal: go home PT Goal Formulation: With patient Time For Goal Achievement: 09/25/20 Potential to Achieve Goals: Good Progress towards PT goals: Progressing toward goals    Frequency    Min 3X/week  PT Plan Current plan remains appropriate    Co-evaluation              AM-PAC PT "6 Clicks" Mobility   Outcome Measure  Help needed turning from your back to your side while in a flat bed without using bedrails?: A Little Help needed moving from lying on your back to sitting on the side of a flat bed without using bedrails?: A Little Help needed moving to and from a bed to a chair (including a wheelchair)?: A Lot Help needed standing up from a chair using your arms (e.g., wheelchair or bedside chair)?: A Lot Help needed to walk in hospital room?: Total Help needed climbing 3-5 steps with a railing? : Total 6 Click Score: 12    End of Session Equipment Utilized During Treatment: Oxygen Activity Tolerance: Patient tolerated treatment well;Patient limited by fatigue Patient left: in bed;with call bell/phone within reach;with bed alarm set Nurse Communication: Mobility status PT  Visit Diagnosis: Muscle weakness (generalized) (M62.81);Unsteadiness on feet (R26.81);Difficulty in walking, not elsewhere classified (R26.2)     Time: 4383-7793 PT Time Calculation (min) (ACUTE ONLY): 29 min  Charges:  $Therapeutic Activity: 8-22 mins $Neuromuscular Re-education: 8-22 mins                     09/14/2019  Chelsea Carne., PT Acute Rehabilitation Services [phone]  (pager) [phone]  (office)   Chelsea Allison 09/14/2019, 6:35 PM

## 2019-09-14 NOTE — Plan of Care (Signed)

## 2019-09-14 NOTE — Progress Notes (Signed)
NAME:  Chelsea Allison, MRN:  [id number], DOB:  1956-03-08, LOS: 9 ADMISSION DATE:  09/05/2019, CONSULTATION DATE:  09/05/19 REFERRING MD:  Stark Jock- EM, CHIEF COMPLAINT:  Respiratory Distress, AMS  Brief History   64 yo female smoker presented to ED with altered mental status from acute on chronic hypoxic/hypercapnic respiratory failure 2nd to HCAP in setting of COPD.  Past Medical History  NSCLS s/p partial resection Anal cancer s/p colostomy CAD with MI s/p PCI COPD on home O2 HFrEF Tobacco abuse  Acute on chronic respiratory failure requiring intubation, repeated   Significant Hospital Events   6/29 admitted to [facility]  7/1 hgb drop.  7/2 SBTx4h 7/4 only tolerated 52min on pressure support. Starting precedex/klonapin. Obtaining hemolytic labs. 7/6 trached  Procedures:  ETT 6/29-7/6 Tracheostomy 7/6  Significant Diagnostic Tests:  CXR 7/2: appropriate positioning of ETT, NG tubes, no acute overlying lung processes noted  CXR 7/6: new patchy left lung base opacity which could represent atelectasis vs aspiration vs pna, tracheostomy in place  Micro Data:  6/29 SARS Cov2>negative 6/29 tracheal aspirate>  6/29 UCx> less than 10,000 colonies insignificant growth 6/29 BCx> NG 6/29 MRSA PCR>negative  Antimicrobials:  6/29 vancomycin>6/30 6/29 pip/tazo> 7/3  Interim history/subjective:  Slow to wean.  Still tachypneic.  Still copious secretions.    Objective   Blood pressure 107/73, pulse 83, temperature 98.5 F (36.9 C), temperature source Oral, resp. rate (!) 32, height 5\' 9"  (1.753 m), weight 86.5 kg, SpO2 98 %.    Vent Mode: PRVC FiO2 (%):  [30 %] 30 % Set Rate:  [21 bmp] 21 bmp Vt Set:  [520 mL] 520 mL PEEP:  [5 cmH20] 5 cmH20 Plateau Pressure:  [24 cmH20-33 cmH20] 33 cmH20   Intake/Output Summary (Last 24 hours) at 09/14/2019 1521 Last data filed at 09/14/2019 1200 Gross per 24 hour  Intake 1632.23 ml  Output 900 ml  Net 732.23 ml   Filed Weights   09/11/19 0414  09/12/19 0319 09/13/19 0500  Weight: 90.6 kg 91.8 kg 86.5 kg    Examination:  General: chronically ill appearing female HEENT: Tracheostomy.  Neuro:alert and oriented, following commands.  Able to communicate by writing. CV: tachycardic rate, regular rhythm. No LE edema. Extremities warm PULM: PRVC PEEP 5 FiO2 30%.  Bilateral rhonchi.  GI: nondistended. bs active Extremities: moving all 4 extremities Skin: no rashes   Assessment & Plan:   Acute on chronic hypercapnic hypoxic respiratory failure requiring mechanical ventilation 2/2 HCAP, complicated by severe anxiety- intubated 5 times in past few months. Received 5d course of zosyn for HCAP S/p tracheostomy 7/6 for inability to wean off vent and repeated intubations. Remains on minimal vent settings -Trial of diuresis -Chest physiotherapy -Daily weaning trial..   Chronic anxiety.  -Adjust chronic pain medication. -Increase enteral sedatives -Wean dexmedetomidine as tolerated.   Chronic HFrEF.  -Trial of diuresis to facilitate weaning  History of CAD. Continue statin.  Brilinta can resume tomorrow.  Iron deficient Anemia.  Stable. No obvious source of bleeding.  DM type 2. Blood sugars stable. Continue q4h 3U novolog  Chronic cancer related pain. -Increase enteral oxycodone.  Hx NSCLC s/p partial resection XLT. Hx anal cancer s/p colectomy.  High risk malnutrition. Continue tube feeds.   Best practice:  Diet: tube feeds DVT prophylaxis: lovenox GI prophylaxis: protonix Mobility: bed rest Code Status: Full  Disposition:  ICU Family update: updated patient  CRITICAL CARE Performed by: Kipp Brood   Total critical care time: 40 minutes  Critical care time was exclusive of separately billable procedures and treating other patients.  Critical care was necessary to treat or prevent imminent or life-threatening deterioration.  Critical care was time spent personally by me on the following activities:  development of treatment plan with patient and/or surrogate as well as nursing, discussions with consultants, evaluation of patient's response to treatment, examination of patient, obtaining history from patient or surrogate, ordering and performing treatments and interventions, ordering and review of laboratory studies, ordering and review of radiographic studies, pulse oximetry, re-evaluation of patient's condition and participation in multidisciplinary rounds.  Kipp Brood, MD [facility] ICU Physician [place]  Pager: [phone] Mobile: [phone] After hours: [phone].    09/14/19 3:21 PM

## 2019-09-15 LAB — CBC
HCT: 26.8 % — ABNORMAL LOW (ref 36.0–46.0)
Hemoglobin: 7.7 g/dL — ABNORMAL LOW (ref 12.0–15.0)
MCH: 26.4 pg (ref 26.0–34.0)
MCHC: 28.7 g/dL — ABNORMAL LOW (ref 30.0–36.0)
MCV: 91.8 fL (ref 80.0–100.0)
Platelets: 624 10*3/uL — ABNORMAL HIGH (ref 150–400)
RBC: 2.92 MIL/uL — ABNORMAL LOW (ref 3.87–5.11)
RDW: 17.3 % — ABNORMAL HIGH (ref 11.5–15.5)
WBC: 7.4 10*3/uL (ref 4.0–10.5)
nRBC: 1.8 % — ABNORMAL HIGH (ref 0.0–0.2)

## 2019-09-15 LAB — BASIC METABOLIC PANEL
Anion gap: 11 (ref 5–15)
BUN: 23 mg/dL (ref 8–23)
CO2: 30 mmol/L (ref 22–32)
Calcium: 9.2 mg/dL (ref 8.9–10.3)
Chloride: 102 mmol/L (ref 98–111)
Creatinine, Ser: 0.81 mg/dL (ref 0.44–1.00)
GFR calc Af Amer: >60 mL/min (ref >60)
GFR calc non Af Amer: >60 mL/min (ref >60)
Glucose, Bld: 169 mg/dL — ABNORMAL HIGH (ref 70–99)
Potassium: 3 mmol/L — ABNORMAL LOW (ref 3.5–5.1)
Sodium: 143 mmol/L (ref 135–145)

## 2019-09-15 LAB — GLUCOSE, CAPILLARY
Glucose-Capillary: 165 mg/dL — ABNORMAL HIGH (ref 70–99)
Glucose-Capillary: 181 mg/dL — ABNORMAL HIGH (ref 70–99)
Glucose-Capillary: 114 mg/dL — ABNORMAL HIGH (ref 70–99)
Glucose-Capillary: 95 mg/dL (ref 70–99)
Glucose-Capillary: 105 mg/dL — ABNORMAL HIGH (ref 70–99)
Glucose-Capillary: 114 mg/dL — ABNORMAL HIGH (ref 70–99)

## 2019-09-15 MED ORDER — POTASSIUM CHLORIDE 10 MEQ/100ML IV SOLN
10.0000 meq | INTRAVENOUS | Status: DC
  Administered 2019-09-15: 10 meq via INTRAVENOUS
  Filled 2019-09-15 (×2): qty 100

## 2019-09-15 MED ORDER — POTASSIUM CHLORIDE 20 MEQ PO PACK
40.0000 meq | PACK | Freq: Two times a day (BID) | ORAL | Status: AC
  Administered 2019-09-15 – 2019-09-16 (×3): 40 meq
  Filled 2019-09-15 (×3): qty 2

## 2019-09-15 MED ORDER — POTASSIUM CHLORIDE 20 MEQ/15ML (10%) PO SOLN
20.0000 meq | ORAL | Status: DC
  Administered 2019-09-15: 20 meq
  Filled 2019-09-15 (×2): qty 15

## 2019-09-15 MED ORDER — QUETIAPINE FUMARATE 50 MG PO TABS
50.0000 mg | ORAL_TABLET | Freq: Two times a day (BID) | ORAL | Status: DC
  Administered 2019-09-15 – 2019-09-20 (×12): 50 mg
  Filled 2019-09-15 (×12): qty 1

## 2019-09-15 MED ORDER — LORAZEPAM 2 MG/ML IJ SOLN
1.0000 mg | INTRAMUSCULAR | Status: DC | PRN
  Administered 2019-09-15: 1 mg via INTRAVENOUS

## 2019-09-15 MED ORDER — LORAZEPAM 2 MG/ML IJ SOLN
INTRAMUSCULAR | Status: AC
  Filled 2019-09-15: qty 1

## 2019-09-15 NOTE — Progress Notes (Signed)
Patient placed back on ventilator due to increased HR, RR and oxygen desaturation. Patient better at this time. RT will continue to monitor.

## 2019-09-15 NOTE — Progress Notes (Signed)
Placed patient on 40% ATC per MD order. Patient tolerating well at this time. RT will continue to monitor.

## 2019-09-15 NOTE — Progress Notes (Signed)
NAME:  Chelsea Allison, MRN:  [id number], DOB:  06-15-1955, LOS: 33 ADMISSION DATE:  09/05/2019, CONSULTATION DATE:  09/05/19 REFERRING MD:  Stark Jock- EM, CHIEF COMPLAINT:  Respiratory Distress, AMS  Brief History   64 yo female smoker presented to ED with altered mental status from acute on chronic hypoxic/hypercapnic respiratory failure 2nd to HCAP in setting of COPD.  Past Medical History  NSCLS s/p partial resection Anal cancer s/p colostomy CAD with MI s/p PCI COPD on home O2 HFrEF Tobacco abuse  Acute on chronic respiratory failure requiring intubation, repeated   Significant Hospital Events   6/29 admitted to [facility]  7/1 hgb drop.  7/2 SBTx4h 7/4 only tolerated 81min on pressure support. Starting precedex/klonapin. Obtaining hemolytic labs. 7/6 trached  Procedures:  ETT 6/29-7/6 Tracheostomy 7/6  Significant Diagnostic Tests:  CXR 7/2: appropriate positioning of ETT, NG tubes, no acute overlying lung processes noted  CXR 7/6: new patchy left lung base opacity which could represent atelectasis vs aspiration vs pna, tracheostomy in place  Micro Data:  6/29 SARS Cov2>negative 6/29 tracheal aspirate>  6/29 UCx> less than 10,000 colonies insignificant growth 6/29 BCx> NG 6/29 MRSA PCR>negative  Antimicrobials:  6/29 vancomycin>6/30 6/29 pip/tazo> 7/3  Interim history/subjective:  Slow to wean.  Still tachypneic.  Still copious secretions.    Objective   Blood pressure (!) 90/51, pulse 86, temperature 99.3 F (37.4 C), temperature source Oral, resp. rate (!) 31, height 5\' 9"  (1.753 m), weight 87.4 kg, SpO2 96 %.    Vent Mode: CPAP;PSV FiO2 (%):  [30 %-40 %] 40 % Set Rate:  [21 bmp] 21 bmp Vt Set:  [520 mL] 520 mL PEEP:  [5 cmH20] 5 cmH20 Pressure Support:  [10 cmH20] 10 cmH20 Plateau Pressure:  [21 cmH20-33 cmH20] 21 cmH20   Intake/Output Summary (Last 24 hours) at 09/15/2019 0941 Last data filed at 09/15/2019 0800 Gross per 24 hour  Intake 2378.85 ml  Output 1600  ml  Net 778.85 ml   Filed Weights   09/12/19 0319 09/13/19 0500 09/15/19 0119  Weight: 91.8 kg 86.5 kg 87.4 kg    Examination:  General: chronically ill appearing female HEENT: Tracheostomy.  Neuro:alert and oriented, following commands.  Able to communicate by writing. CV: tachycardic rate, regular rhythm. No LE edema. Extremities warm PULM: PRVC PEEP 5 FiO2 30%.  Bilateral rhonchi.  GI: nondistended. bs active Extremities: moving all 4 extremities Skin: no rashes   Assessment & Plan:   Acute on chronic hypercapnic hypoxic respiratory failure requiring mechanical ventilation 2/2 HCAP, complicated by severe anxiety- intubated 5 times in past few months. Received 5d course of zosyn for HCAP S/p tracheostomy 7/6 for inability to wean off vent and repeated intubations. Remains on minimal vent settings -Trial of diuresis -Chest physiotherapy -Trial of trach collar - will premedicate with lorazepam to see if tachypnea is in part anxiety driven.  Chronic anxiety.  -Adjust chronic pain medication. -Increase enteral sedatives -Wean dexmedetomidine as tolerated.  Chronic HFrEF.  -Trial of diuresis to facilitate weaning  History of CAD. Continue statin.  Brilinta can resume tomorrow.  Iron deficient Anemia.  Stable. No obvious source of bleeding.  DM type 2. Blood sugars stable. Continue q4h 3U novolog  Chronic cancer related pain. -Increase enteral oxycodone.  Hx NSCLC s/p partial resection XLT. Hx anal cancer s/p colectomy.  High risk malnutrition. Continue tube feeds.   Best practice:  Diet: tube feeds DVT prophylaxis: lovenox GI prophylaxis: protonix Mobility: bed rest Code Status: Full  Disposition:  ICU Family update: updated patient  CRITICAL CARE Performed by: Kipp Brood   Total critical care time: 40 minutes  Critical care time was exclusive of separately billable procedures and treating other patients.  Critical care was necessary to treat or  prevent imminent or life-threatening deterioration.  Critical care was time spent personally by me on the following activities: development of treatment plan with patient and/or surrogate as well as nursing, discussions with consultants, evaluation of patient's response to treatment, examination of patient, obtaining history from patient or surrogate, ordering and performing treatments and interventions, ordering and review of laboratory studies, ordering and review of radiographic studies, pulse oximetry, re-evaluation of patient's condition and participation in multidisciplinary rounds.  Kipp Brood, MD [facility] ICU Physician [place]  Pager: [phone] Mobile: [phone] After hours: [phone].    09/15/19 9:41 AM

## 2019-09-15 NOTE — Progress Notes (Signed)
Physical Therapy Treatment Patient Details Name: Chelsea Allison MRN: [id number] DOB: February 22, 1956 Today's Date: 09/15/2019    History of Present Illness 64 yo female from home with respiratory distress and hypoxia.  Was in hospital from 6/03 to 6/18 for COPD exacerbation, PNA, CAD, acute on chronic systolic CHF.  Required intubation in ER.  Found to have increasing infiltrates on CXR.  Started on ABx.  7/6 tracheostomy.    PT Comments    Pt refused to sit EOB when pt given option of a limited amount of activity and benefits explained.  Used be egress/sitting feature to get pt fully upright in the bed.  RR decreased to 24 from 41 rpm, sats 99% on 40% FiO2 on TC    Follow Up Recommendations  SNF     Equipment Recommendations  None recommended by PT    Recommendations for Other Services       Precautions / Restrictions Precautions Precautions: Fall Precaution Comments: monitor pulses and sats to walk    Mobility  Bed Mobility Overal bed mobility: Needs Assistance             General bed mobility comments: pt refused attempts to get her legs off the bed and pt was transitioned in the bed to a full upright sitting position.  Transfers                 General transfer comment: pt refused today  Ambulation/Gait             General Gait Details: NT today   Stairs             Wheelchair Mobility    Modified Rankin (Stroke Patients Only)       Balance Overall balance assessment: Needs assistance   Sitting balance-Leahy Scale: Fair                                      Cognition Arousal/Alertness: Awake/alert Behavior During Therapy: WFL for tasks assessed/performed Overall Cognitive Status: Within Functional Limits for tasks assessed                                 General Comments: pt communicating through writing on paper      Exercises Other Exercises Other Exercises: warm up LE ROM exercise    General  Comments General comments (skin integrity, edema, etc.): On arrival pt's BP was 121/75, sats on TC at 40% FiO2 were 98% with RR at 41 rpm.  In sitting up in bed at >60*, pt's BP 122/93, sats 99% and RR 24 rpm      Pertinent Vitals/Pain Pain Assessment: Faces Faces Pain Scale: Hurts even more Pain Location: new trach site, rotten teeth Pain Descriptors / Indicators: Sore;Throbbing Pain Intervention(s): Monitored during session    Home Living                      Prior Function            PT Goals (current goals can now be found in the care plan section) Acute Rehab PT Goals Patient Stated Goal: go home PT Goal Formulation: With patient Time For Goal Achievement: 09/25/20 Potential to Achieve Goals: Good Progress towards PT goals: Not progressing toward goals - comment (Pt limited by inc WOB and a little self limiting)    Frequency  Min 3X/week      PT Plan Current plan remains appropriate    Co-evaluation              AM-PAC PT "6 Clicks" Mobility   Outcome Measure  Help needed turning from your back to your side while in a flat bed without using bedrails?: A Little Help needed moving from lying on your back to sitting on the side of a flat bed without using bedrails?: A Little Help needed moving to and from a bed to a chair (including a wheelchair)?: A Lot Help needed standing up from a chair using your arms (e.g., wheelchair or bedside chair)?: A Lot Help needed to walk in hospital room?: Total Help needed climbing 3-5 steps with a railing? : Total 6 Click Score: 12    End of Session Equipment Utilized During Treatment: Oxygen Activity Tolerance: Patient limited by fatigue;Patient limited by pain Patient left: in bed;with call bell/phone within reach;with bed alarm set Nurse Communication: Mobility status PT Visit Diagnosis: Muscle weakness (generalized) (M62.81);Unsteadiness on feet (R26.81);Difficulty in walking, not elsewhere classified  (R26.2)     Time: 4709-2957 PT Time Calculation (min) (ACUTE ONLY): 18 min  Charges:  $Therapeutic Activity: 8-22 mins                     09/15/2019  Chelsea Allison., PT Acute Rehabilitation Services [phone]  (pager) [phone]  (office)   Chelsea Allison 09/15/2019, 1:33 PM

## 2019-09-15 NOTE — Progress Notes (Signed)
[facility] ADULT ICU REPLACEMENT PROTOCOL   The patient does apply for the [facility] Adult ICU Electrolyte Replacment Protocol based on the criteria listed below:   1. Is GFR >/= 30 ml/min? Yes.    Patient's GFR today is >60 2. Is SCr </= 2? Yes.   Patient's SCr is 0.81 ml/kg/hr 3. Did SCr increase >/= 0.5 in 24 hours? 4. Abnormal electrolyte(s): K-3.0 5. Ordered repletion with: protocol 6. If a panic level lab has been reported, has the CCM MD in charge been notified? Yes.  .   Physician:  Dr. Jonetta Speak, Philis Nettle 09/15/2019 6:28 AM

## 2019-09-16 ENCOUNTER — Inpatient Hospital Stay (HOSPITAL_COMMUNITY): Payer: Medicare HMO

## 2019-09-16 DIAGNOSIS — J441 Chronic obstructive pulmonary disease with (acute) exacerbation: Secondary | ICD-10-CM

## 2019-09-16 DIAGNOSIS — Z9911 Dependence on respirator [ventilator] status: Secondary | ICD-10-CM

## 2019-09-16 DIAGNOSIS — Z930 Tracheostomy status: Secondary | ICD-10-CM

## 2019-09-16 LAB — GLUCOSE, CAPILLARY
Glucose-Capillary: 142 mg/dL — ABNORMAL HIGH (ref 70–99)
Glucose-Capillary: 114 mg/dL — ABNORMAL HIGH (ref 70–99)
Glucose-Capillary: 126 mg/dL — ABNORMAL HIGH (ref 70–99)
Glucose-Capillary: 133 mg/dL — ABNORMAL HIGH (ref 70–99)
Glucose-Capillary: 118 mg/dL — ABNORMAL HIGH (ref 70–99)

## 2019-09-16 LAB — BASIC METABOLIC PANEL
Anion gap: 13 (ref 5–15)
BUN: 26 mg/dL — ABNORMAL HIGH (ref 8–23)
CO2: 30 mmol/L (ref 22–32)
Calcium: 9.1 mg/dL (ref 8.9–10.3)
Chloride: 97 mmol/L — ABNORMAL LOW (ref 98–111)
Creatinine, Ser: 0.9 mg/dL (ref 0.44–1.00)
GFR calc Af Amer: >60 mL/min (ref >60)
GFR calc non Af Amer: >60 mL/min (ref >60)
Glucose, Bld: 124 mg/dL — ABNORMAL HIGH (ref 70–99)
Potassium: 3.4 mmol/L — ABNORMAL LOW (ref 3.5–5.1)
Sodium: 140 mmol/L (ref 135–145)

## 2019-09-16 LAB — CBC
HCT: 30 % — ABNORMAL LOW (ref 36.0–46.0)
Hemoglobin: 8.4 g/dL — ABNORMAL LOW (ref 12.0–15.0)
MCH: 25.8 pg — ABNORMAL LOW (ref 26.0–34.0)
MCHC: 28 g/dL — ABNORMAL LOW (ref 30.0–36.0)
MCV: 92 fL (ref 80.0–100.0)
Platelets: 692 10*3/uL — ABNORMAL HIGH (ref 150–400)
RBC: 3.26 MIL/uL — ABNORMAL LOW (ref 3.87–5.11)
RDW: 18.2 % — ABNORMAL HIGH (ref 11.5–15.5)
WBC: 8.5 10*3/uL (ref 4.0–10.5)
nRBC: 0.8 % — ABNORMAL HIGH (ref 0.0–0.2)

## 2019-09-16 MED ORDER — POTASSIUM CHLORIDE 20 MEQ/15ML (10%) PO SOLN
40.0000 meq | Freq: Once | ORAL | Status: AC
  Administered 2019-09-16: 40 meq
  Filled 2019-09-16: qty 30

## 2019-09-16 MED ORDER — FUROSEMIDE 10 MG/ML IJ SOLN
40.0000 mg | Freq: Two times a day (BID) | INTRAMUSCULAR | Status: AC
  Administered 2019-09-16 – 2019-09-18 (×4): 40 mg via INTRAVENOUS
  Filled 2019-09-16 (×4): qty 4

## 2019-09-16 MED ORDER — GUAIFENESIN 100 MG/5ML PO SOLN
15.0000 mL | Freq: Four times a day (QID) | ORAL | Status: DC
  Administered 2019-09-16 – 2019-10-04 (×73): 300 mg
  Filled 2019-09-16 (×43): qty 15
  Filled 2019-09-16: qty 45
  Filled 2019-09-16 (×26): qty 15
  Filled 2019-09-16: qty 45
  Filled 2019-09-16 (×2): qty 15

## 2019-09-16 NOTE — Progress Notes (Signed)
RT called to patient's room due to patient having oxygen saturations in the 40s while on trach collar.  RN had increased patient's FIO2 to 98% prior to RT arriving.  Upon arrival patients sats were reading in 70s and patient was gray in color and HR of 140s.  RN was also attempting to place patient back on ventilator.  Patient placed back on ventilator and given 100% breath.  Lavaged patient and was able to obtain a moderate amount of thick, pink/tan secretions.  Patient's sats improved to 97% and color has improved.  Increased patient's FIO2 from 40% to 60%.  HR now at 125.  Will let unit RT know of events.  RT will continue to monitor.

## 2019-09-16 NOTE — Progress Notes (Signed)
[facility] ADULT ICU REPLACEMENT PROTOCOL   The patient does apply for the [facility] Adult ICU Electrolyte Replacment Protocol based on the criteria listed below:   1. Is GFR >/= 30 ml/min? Yes.    Patient's GFR today is >60 2. Is SCr </= 2? Yes.   Patient's SCr is 0.90 ml/kg/hr 3. Did SCr increase >/= 0.5 in 24 hours? no 4. Abnormal electrolyte(s): K-3.4 5. Ordered repletion with: per protocol 6. If a panic level lab has been reported, has the CCM MD in charge been notified? Yes.  .   Physician:  Dr. Jonetta Speak, Philis Nettle 09/16/2019 6:30 AM

## 2019-09-16 NOTE — Progress Notes (Signed)
eLink Physician-Brief Progress Note Patient Name: Alyene Predmore DOB: Aug 07, 1955 MRN: [id number]   Date of Service  09/16/2019  HPI/Events of Note  Patient needs K+ replaced, creatinine is normal.  eICU Interventions  ELINK nursing electrolyte replacement protocol ordered.        Kerry Kass Yoseph Haile 09/16/2019, 6:23 AM

## 2019-09-16 NOTE — Progress Notes (Signed)
NAME:  Chelsea Allison, MRN:  [id number], DOB:  1955/08/28, LOS: 64 ADMISSION DATE:  09/05/2019, CONSULTATION DATE:  09/05/19 REFERRING MD:  Stark Jock- EM, CHIEF COMPLAINT:  Respiratory Distress, AMS  Brief History   64 yo female smoker presented to ED with altered mental status from acute on chronic hypoxic/hypercapnic respiratory failure 2nd to HCAP in setting of COPD.  Past Medical History  NSCLS s/p partial resection Anal cancer s/p colostomy CAD with MI s/p PCI COPD on home O2 HFrEF Tobacco abuse  Acute on chronic respiratory failure requiring intubation, repeated   Significant Hospital Events   6/29 admitted to [facility]  7/1 hgb drop.  7/2 SBTx4h 7/4 only tolerated 32min on pressure support. Starting precedex/klonapin. Obtaining hemolytic labs. 7/6 trached 7/9: Copious secretions still noted.  Tachypneic.  Mildly hypotensive.  Presuming anxiety complicating weaning efforts titration of anxiolytic regimen performed, starting aerosol trach collar trials 7/10: Was on trach collar, pulse oximetry dropping to 40s supplemental oxygen increase but still on the needed achieve 70% FiO2 placed back on ventilator lavaged for pink-tan secretions saturations improved back on vent, checking sputum culture and chest x-ray.   Procedures:  ETT 6/29-7/6 Tracheostomy 7/6  Significant Diagnostic Tests:  CXR 7/2: appropriate positioning of ETT, NG tubes, no acute overlying lung processes noted  CXR 7/6: new patchy left lung base opacity which could represent atelectasis vs aspiration vs pna, tracheostomy in place  Micro Data:  6/29 SARS Cov2>negative 6/29 tracheal aspirate>  6/29 UCx> less than 10,000 colonies insignificant growth 6/29 BCx> NG 6/29 MRSA PCR>negative 7/10: Sputum culture>>> Antimicrobials:  6/29 vancomycin>6/30 6/29 pip/tazo> 7/3  Interim history/subjective:  She appears comfortable on full ventilator support no distress Objective   Blood pressure 107/60, pulse (Abnormal)  116, temperature 99.6 F (37.6 C), temperature source Oral, resp. rate (Abnormal) 25, height 5\' 9"  (1.753 m), weight 82.2 kg, SpO2 97 %.    Vent Mode: PRVC FiO2 (%):  [40 %-60 %] 60 % Set Rate:  [21 bmp] 21 bmp Vt Set:  [520 mL] 520 mL PEEP:  [5 cmH20] 5 cmH20 Plateau Pressure:  [21 cmH20-35 cmH20] 35 cmH20   Intake/Output Summary (Last 24 hours) at 09/16/2019 1013 Last data filed at 09/16/2019 0900 Gross per 24 hour  Intake 1706.73 ml  Output 1670 ml  Net 36.73 ml   Filed Weights   09/13/19 0500 09/15/19 0119 09/16/19 0401  Weight: 86.5 kg 87.4 kg 82.2 kg    Examination:  General this is an awake 64 year old white female she is currently on full ventilator support and in no distress HEENT normocephalic atraumatic size 6 cuffed tracheostomy in place this is unremarkable Pulmonary some scattered rhonchi no accessory use currently PEEP 5 FiO2 70%, I have decreased this down to 60 Cardiac regular rate and rhythm without murmur rub or gallop Abdomen soft nontender no organomegaly functioning colostomy tube noted stoma is pink, passing flatulence Extremities warm dry trace lower extremity edema pulses are palpable and strong brisk capillary refill Neuro awake oriented follows commands appropriate seems comfortable GU clear yellow  Assessment & Plan:   Acute on chronic hypercapnic hypoxic respiratory failure requiring mechanical ventilation 2/2 HCAP, COPD exacerbation complicated by severe anxiety S/p tracheostomy 7/6 for inability to wean off vent and repeated intubations.  -Anxiety, underlying lung mechanics, and copious pulmonary secretions currently barrier to progress.  Had been on aerosol trach collar yesterday now back on full support  Last chest x-ray on 7/8 showed volume loss on the right with right greater  than left airspace disease and chronic elevation of diaphragm Plan Continue to cycle aerosol trach collar and wean as tolerated We will place on mechanical ventilation  mandatory at at bedtime Scheduled bronchodilators Sputum culture given mucus plugging Follow-up chest x-ray RASS goal 0  Add Mucinex  Chronic cancer related pain and anxiety.  -For anxiety lytic regimen has been adjusted.  She is off Precedex currently Plan Discontinue Precedex from medication profile We will continue buprenorphone patch, Klonopin 2 mg 3 times a day, as needed low-dose lorazepam, as needed OxyIR   Chronic HFrEF w/ h/o CAD  Plan Continue telemetry monitoring Continue aspirin Continue statin Continue Brilinta Daily assessment for diuresis  Fluid and electrolyte imbalance: Hypokalemia Plan  replace and recheck   Iron deficient Anemia.  Stable. No obvious source of bleeding. Plan Trend CBC   DM type 2. Blood sugars stable Plan Sliding scale insulin   Hx NSCLC s/p partial resection XLT. Hx anal cancer s/p colectomy. Plan Outpatient follow-up at some point  High risk malnutrition.  Plan Continue tube feeds  Best practice:  Diet: tube feeds DVT prophylaxis: lovenox GI prophylaxis: protonix Mobility: bed rest Code Status: Full  Disposition:  ICU Family update: updated patient  CRITICAL CARE Performed by: Clementeen Graham My critical care x35 minutes

## 2019-09-17 DIAGNOSIS — Z930 Tracheostomy status: Secondary | ICD-10-CM

## 2019-09-17 LAB — BASIC METABOLIC PANEL
Anion gap: 12 (ref 5–15)
BUN: 27 mg/dL — ABNORMAL HIGH (ref 8–23)
CO2: 32 mmol/L (ref 22–32)
Calcium: 9.4 mg/dL (ref 8.9–10.3)
Chloride: 97 mmol/L — ABNORMAL LOW (ref 98–111)
Creatinine, Ser: 0.95 mg/dL (ref 0.44–1.00)
GFR calc Af Amer: >60 mL/min (ref >60)
GFR calc non Af Amer: >60 mL/min (ref >60)
Glucose, Bld: 148 mg/dL — ABNORMAL HIGH (ref 70–99)
Potassium: 3.5 mmol/L (ref 3.5–5.1)
Sodium: 141 mmol/L (ref 135–145)

## 2019-09-17 LAB — CBC
HCT: 30.1 % — ABNORMAL LOW (ref 36.0–46.0)
Hemoglobin: 8.5 g/dL — ABNORMAL LOW (ref 12.0–15.0)
MCH: 26.2 pg (ref 26.0–34.0)
MCHC: 28.2 g/dL — ABNORMAL LOW (ref 30.0–36.0)
MCV: 92.6 fL (ref 80.0–100.0)
Platelets: 623 10*3/uL — ABNORMAL HIGH (ref 150–400)
RBC: 3.25 MIL/uL — ABNORMAL LOW (ref 3.87–5.11)
RDW: 18.4 % — ABNORMAL HIGH (ref 11.5–15.5)
WBC: 8.4 10*3/uL (ref 4.0–10.5)
nRBC: 0.6 % — ABNORMAL HIGH (ref 0.0–0.2)

## 2019-09-17 LAB — GLUCOSE, CAPILLARY
Glucose-Capillary: 112 mg/dL — ABNORMAL HIGH (ref 70–99)
Glucose-Capillary: 113 mg/dL — ABNORMAL HIGH (ref 70–99)
Glucose-Capillary: 132 mg/dL — ABNORMAL HIGH (ref 70–99)
Glucose-Capillary: 138 mg/dL — ABNORMAL HIGH (ref 70–99)
Glucose-Capillary: 141 mg/dL — ABNORMAL HIGH (ref 70–99)
Glucose-Capillary: 149 mg/dL — ABNORMAL HIGH (ref 70–99)

## 2019-09-17 LAB — MAGNESIUM: Magnesium: 2.2 mg/dL (ref 1.7–2.4)

## 2019-09-17 MED ORDER — POTASSIUM CHLORIDE 20 MEQ/15ML (10%) PO SOLN
40.0000 meq | Freq: Once | ORAL | Status: AC
  Administered 2019-09-17: 40 meq
  Filled 2019-09-17: qty 30

## 2019-09-17 NOTE — Progress Notes (Signed)
NAME:  Chelsea Allison, MRN:  [id number], DOB:  August 16, 1955, LOS: 12 ADMISSION DATE:  09/05/2019, CONSULTATION DATE:  09/05/19 REFERRING MD:  Stark Jock- EM, CHIEF COMPLAINT:  Respiratory Distress, AMS  Brief History   64 yo female smoker presented to ED with altered mental status from acute on chronic hypoxic/hypercapnic respiratory failure 2nd to HCAP in setting of COPD.  Past Medical History  NSCLS s/p partial resection Anal cancer s/p colostomy CAD with MI s/p PCI COPD on home O2 HFrEF Tobacco abuse  Acute on chronic respiratory failure requiring intubation, repeated   Significant Hospital Events   6/29 admitted to [facility]  7/1 hgb drop.  7/2 SBTx4h 7/4 only tolerated 76min on pressure support. Starting precedex/klonapin. Obtaining hemolytic labs. 7/6 trached 7/9: Copious secretions still noted.  Tachypneic.  Mildly hypotensive.  Presuming anxiety complicating weaning efforts titration of anxiolytic regimen performed, starting aerosol trach collar trials 7/10: Was on trach collar, pulse oximetry dropping to 40s supplemental oxygen increase but still on the needed achieve 70% FiO2 placed back on ventilator lavaged for pink-tan secretions saturations improved back on vent, checking sputum culture and chest x-ray. 7/11 looks better. Working on weaning   Procedures:  ETT 6/29-7/6 Tracheostomy 7/6  Significant Diagnostic Tests:  CXR 7/2: appropriate positioning of ETT, NG tubes, no acute overlying lung processes noted  CXR 7/6: new patchy left lung base opacity which could represent atelectasis vs aspiration vs pna, tracheostomy in place  Micro Data:  6/29 SARS Cov2>negative 6/29 tracheal aspirate>  6/29 UCx> less than 10,000 colonies insignificant growth 6/29 BCx> NG 6/29 MRSA PCR>negative 7/10: Sputum culture: Rare GPC pairs, rare TPR Antimicrobials:  6/29 vancomycin>6/30 6/29 pip/tazo> 7/3  Interim history/subjective:  No issues overnight.  Denies distress or pain Objective    Blood pressure 97/60, pulse 89, temperature 99 F (37.2 C), temperature source Oral, resp. rate (Abnormal) 21, height 5\' 9"  (1.753 m), weight 81.1 kg, SpO2 95 %.    Vent Mode: PSV FiO2 (%):  [35 %-60 %] 35 % Set Rate:  [21 bmp] 21 bmp Vt Set:  [520 mL] 520 mL PEEP:  [5 cmH20] 5 cmH20 Pressure Support:  [10 cmH20] 10 cmH20 Plateau Pressure:  [18 cmH20-35 cmH20] 21 cmH20   Intake/Output Summary (Last 24 hours) at 09/17/2019 0834 Last data filed at 09/17/2019 0500 Gross per 24 hour  Intake 1669.9 ml  Output 3650 ml  Net -1980.1 ml   Filed Weights   09/15/19 0119 09/16/19 0401 09/17/19 0403  Weight: 87.4 kg 82.2 kg 81.1 kg    Examination: General chronically ill appearing 64 year old female she is awake, currently on full ventilator support, no distress. HEENT normocephalic atraumatic size 6 cuffed tracheostomy is unremarkable Pulmonary some scattered rhonchi no accessory use currently FiO2 35% PEEP 5 Cardiac regular rate and rhythm Abdomen soft nontender colostomy unremarkable tolerating tube feeds Extremities are warm and dry with brisk capillary refill Neuro awake oriented appropriate GU clear yellow Assessment & Plan:   Acute on chronic hypercapnic hypoxic respiratory failure requiring mechanical ventilation 2/2 HCAP, COPD exacerbation complicated by severe anxiety S/p tracheostomy 7/6 for inability to wean off vent and repeated intubations.  -Anxiety, underlying lung mechanics, and copious pulmonary secretions currently barrier to progress.  Had been on aerosol trach collar yesterday now back on full support  Chest x-ray from 7/10 Showed left greater than right hazy changes; cannot exclude infiltrate Sputum still pending Tolerating pressure support ventilation this morning Plan Continue daily assessment for weaning, would like to work towards  daily attempts at aerosol trach collar.  I think she will need full support at night, at least initially Continue scheduled  bronchodilators Follow-up sputum culture Continue Mucinex RASS goal 0 Daily assessment for diuresis Out of bed Needs LTAC  Chronic cancer related pain and anxiety.  -For anxiety lytic regimen has been adjusted.  She is off Precedex currently Plan Continue buprenorphone patch, Klonopin, as needed OxyIR and lorazepam   Chronic HFrEF w/ h/o CAD  Plan Continue telemetry  Continue aspirin Brilinta and Daily assessment for diuresis    Fluid and electrolyte imbalance: Hypokalemia Plan  Replace and recheck   Iron deficient Anemia.  Stable. No obvious source of bleeding. Plan Trend CBC and transfuse for hemoglobin less than 7  DM type 2. Blood sugars stable Plan Sliding scale insulin  Hx NSCLC s/p partial resection XLT. Hx anal cancer s/p colectomy. Plan Outpatient follow-up at some point  High risk malnutrition.  Plan Tube feeds  Best practice:  Diet: tube feeds DVT prophylaxis: lovenox GI prophylaxis: protonix Mobility: bed rest Code Status: Full  Disposition:  ICU Family update: updated patient  CRITICAL CARE Performed by: Clementeen Graham My critical care x32 minutes.

## 2019-09-18 LAB — BASIC METABOLIC PANEL
Anion gap: 15 (ref 5–15)
BUN: 30 mg/dL — ABNORMAL HIGH (ref 8–23)
CO2: 31 mmol/L (ref 22–32)
Calcium: 9.5 mg/dL (ref 8.9–10.3)
Chloride: 94 mmol/L — ABNORMAL LOW (ref 98–111)
Creatinine, Ser: 1.05 mg/dL — ABNORMAL HIGH (ref 0.44–1.00)
GFR calc Af Amer: >60 mL/min (ref >60)
GFR calc non Af Amer: 56 mL/min — ABNORMAL LOW (ref >60)
Glucose, Bld: 159 mg/dL — ABNORMAL HIGH (ref 70–99)
Potassium: 3.5 mmol/L (ref 3.5–5.1)
Sodium: 140 mmol/L (ref 135–145)

## 2019-09-18 LAB — GLUCOSE, CAPILLARY
Glucose-Capillary: 113 mg/dL — ABNORMAL HIGH (ref 70–99)
Glucose-Capillary: 114 mg/dL — ABNORMAL HIGH (ref 70–99)
Glucose-Capillary: 114 mg/dL — ABNORMAL HIGH (ref 70–99)
Glucose-Capillary: 120 mg/dL — ABNORMAL HIGH (ref 70–99)
Glucose-Capillary: 128 mg/dL — ABNORMAL HIGH (ref 70–99)
Glucose-Capillary: 132 mg/dL — ABNORMAL HIGH (ref 70–99)
Glucose-Capillary: 139 mg/dL — ABNORMAL HIGH (ref 70–99)

## 2019-09-18 LAB — CBC
HCT: 33.3 % — ABNORMAL LOW (ref 36.0–46.0)
Hemoglobin: 9.3 g/dL — ABNORMAL LOW (ref 12.0–15.0)
MCH: 25.7 pg — ABNORMAL LOW (ref 26.0–34.0)
MCHC: 27.9 g/dL — ABNORMAL LOW (ref 30.0–36.0)
MCV: 92 fL (ref 80.0–100.0)
Platelets: 590 10*3/uL — ABNORMAL HIGH (ref 150–400)
RBC: 3.62 MIL/uL — ABNORMAL LOW (ref 3.87–5.11)
RDW: 18.6 % — ABNORMAL HIGH (ref 11.5–15.5)
WBC: 9 10*3/uL (ref 4.0–10.5)
nRBC: 0.8 % — ABNORMAL HIGH (ref 0.0–0.2)

## 2019-09-18 LAB — CULTURE, RESPIRATORY W GRAM STAIN

## 2019-09-18 MED ORDER — CLONAZEPAM 1 MG PO TABS
1.0000 mg | ORAL_TABLET | Freq: Three times a day (TID) | ORAL | Status: DC
  Administered 2019-09-18 – 2019-09-19 (×6): 1 mg
  Filled 2019-09-18 (×6): qty 1

## 2019-09-18 MED ORDER — CEFAZOLIN SODIUM-DEXTROSE 2-4 GM/100ML-% IV SOLN
2.0000 g | Freq: Three times a day (TID) | INTRAVENOUS | Status: AC
  Administered 2019-09-18 – 2019-09-25 (×21): 2 g via INTRAVENOUS
  Filled 2019-09-18 (×21): qty 100

## 2019-09-18 NOTE — Consult Note (Signed)
Media Nurse Consult Note: Patient receiving care in [facility] 3M10. Reason for Consult: re-evaluation of coccyx wound Wound type:unstageable PI Pressure Injury POA: No Measurement: 2 cm x 2.5 cm Wound bed: 99% yellow; granulation border circumferentially Drainage (amount, consistency, odor) none Periwound: erythematous Dressing procedure/placement/frequency: continue enzymatic debridement. Monitor the wound area(s) for worsening of condition such as: Signs/symptoms of infection,  Increase in size,  Development of or worsening of odor, Development of pain, or increased pain at the affected locations.  Notify the medical team if any of these develop. Val Riles, RN, MSN, CWOCN, CNS-BC, pager [phone]

## 2019-09-18 NOTE — Progress Notes (Signed)
NAME:  Chelsea Allison, MRN:  [id number], DOB:  Feb 11, 1956, LOS: 16 ADMISSION DATE:  09/05/2019, CONSULTATION DATE:  09/05/19 REFERRING MD:  Stark Jock- EM, CHIEF COMPLAINT:  Respiratory Distress, AMS  Brief History   64 yo female smoker presented to ED with altered mental status from acute on chronic hypoxic/hypercapnic respiratory failure 2nd to HCAP in setting of COPD.  Past Medical History  NSCLS s/p partial resection Anal cancer s/p colostomy CAD with MI s/p PCI COPD on home O2 HFrEF Tobacco abuse  Acute on chronic respiratory failure requiring intubation, repeated   Significant Hospital Events   6/29 admitted to [facility]  7/1 hgb drop.  7/2 SBTx4h 7/4 only tolerated 4min on pressure support. Starting precedex/klonapin. Obtaining hemolytic labs. 7/6 trached 7/9: Copious secretions still noted.  Tachypneic.  Mildly hypotensive.  Presuming anxiety complicating weaning efforts titration of anxiolytic regimen performed, starting aerosol trach collar trials 7/10: Was on trach collar, pulse oximetry dropping to 40s supplemental oxygen increase but still on the needed achieve 70% FiO2 placed back on ventilator lavaged for pink-tan secretions saturations improved back on vent, checking sputum culture and chest x-ray. 7/11 looks better. Working on weaning   Procedures:  ETT 6/29-7/6 Tracheostomy 7/6  Significant Diagnostic Tests:  CXR 7/2: appropriate positioning of ETT, NG tubes, no acute overlying lung processes noted  CXR 7/6: new patchy left lung base opacity which could represent atelectasis vs aspiration vs pna, tracheostomy in place  Micro Data:  6/29 SARS Cov2>negative 6/29 tracheal aspirate>  6/29 UCx> less than 10,000 colonies insignificant growth 6/29 BCx> NG 6/29 MRSA PCR>negative 7/10: Sputum culture: E. coli, Klebsiella  Antimicrobials:  6/29 vancomycin>6/30 6/29 pip/tazo> 7/3 Ancel 7/12 >>   Interim history/subjective:  No issues overnight.  Denies distress or  pain  Objective   Blood pressure (!) 91/56, pulse 97, temperature 98.9 F (37.2 C), temperature source Oral, resp. rate (!) 21, height 5\' 9"  (1.753 m), weight 81.1 kg, SpO2 94 %.    Vent Mode: PRVC FiO2 (%):  [35 %] 35 % Set Rate:  [21 bmp] 21 bmp Vt Set:  [520 mL] 520 mL PEEP:  [5 cmH20] 5 cmH20 Plateau Pressure:  [20 cmH20-23 cmH20] 20 cmH20   Intake/Output Summary (Last 24 hours) at 09/18/2019 0930 Last data filed at 09/18/2019 0900 Gross per 24 hour  Intake 1039.97 ml  Output 1100 ml  Net -60.03 ml   Filed Weights   09/15/19 0119 09/16/19 0401 09/17/19 0403  Weight: 87.4 kg 82.2 kg 81.1 kg    Examination: Gen:      Chronically ill-appearing HEENT:  EOMI, sclera anicteric Neck:     No masses; no thyromegaly, trach Lungs:    Clear to auscultation bilaterally; normal respiratory effort CV:         Regular rate and rhythm; no murmurs Abd:      + bowel sounds; soft, non-tender; no palpable masses, no distension Ext:    No edema; adequate peripheral perfusion Skin:      Warm and dry; no rash Neuro: Awake, responsive  Labs reviewed significant for Tracheal aspirate now growing Klebsiella, E. coli which is pansensitive CBC, metabolic panel is stable No new imaging  Assessment & Plan:   Acute on chronic hypercapnic hypoxic respiratory failure requiring mechanical ventilation 2/2 HCAP, COPD exacerbation complicated by severe anxiety S/p tracheostomy 7/6 for inability to wean off vent and repeated intubations.  -Anxiety, underlying lung mechanics, and copious pulmonary secretions currently barrier to progress.  Plan Pressure support weans  as tolerated.  Working towards trach collar Vent support at night Start Ancef for pansensitive E. coli and Klebsiella ventriculitis. Continue bronchodilators, Mucinex Out of bed, mobilize PT consult placed  Chronic cancer related pain and anxiety.  -For anxiety lytic regimen has been adjusted.  She is off Precedex  currently Plan Continue buprenorphone patch, Klonopin, as needed OxyIR and lorazepam  She is on a lot of sedating meds. Wean klonopin from 2 mg tid to 1 mg tid  Chronic HFrEF w/ h/o CAD  Plan Continue telemetry  Continue aspirin Brilinta and Daily assessment for diuresis   Iron deficient Anemia.  Stable. No obvious source of bleeding. Plan Trend CBC and transfuse for hemoglobin less than 7  DM type 2. Blood sugars stable Plan Sliding scale insulin  Hx NSCLC s/p partial resection XLT. Hx anal cancer s/p colectomy. Plan Outpatient follow-up at some point  High risk malnutrition.  Plan Tube feeds  Best practice:  Diet: tube feeds DVT prophylaxis: lovenox GI prophylaxis: protonix Mobility: bed rest Code Status: Full  Disposition:  ICU Family update: updated patient  The patient is critically ill with multiple organ system failure and requires high complexity decision making for assessment and support, frequent evaluation and titration of therapies, advanced monitoring, review of radiographic studies and interpretation of complex data.   Critical Care Time devoted to patient care services, exclusive of separately billable procedures, described in this note is 35 minutes.   Marshell Garfinkel MD [facility] Please see [email or web address] for pager details.  09/18/2019, 9:31 AM

## 2019-09-18 NOTE — Evaluation (Signed)
[facility] Speaking Valve - Evaluation Patient Details  Name: Chelsea Allison MRN: [id number] Date of Birth: 04/25/1955  Today's Date: 09/18/2019 Time: 1340-1405 SLP Time Calculation (min) (ACUTE ONLY): 25 min  Past Medical History:  Past Medical History:  Diagnosis Date  . CAD (coronary artery disease) 10/20 DES to pRCA, DES mCx   . Cancer of lung ([place])    Twice  . Colon cancer ([place])   . COPD (chronic obstructive pulmonary disease) ([place])   . Hyperlipidemia   . Hypertension   . Ischemic cardiomyopathy   . Neuropathy    Secondary to chemo  . STEMI (ST elevation myocardial infarction) ([place]) 12/12/2018  . Tobacco abuse    Past Surgical History:  Past Surgical History:  Procedure Laterality Date  . ABDOMINAL SURGERY     For colon cancer  . COLOSTOMY    . CORONARY STENT INTERVENTION N/A 12/20/2018   Procedure: CORONARY STENT INTERVENTION - Right;  Surgeon: Burnell Blanks, MD;  Location: [place] [facility];  Service: Cardiovascular;  Laterality: N/A;  . CORONARY/GRAFT ACUTE MI REVASCULARIZATION N/A 12/12/2018   Procedure: Coronary/Graft Acute MI Revascularization;  Surgeon: Belva Crome, MD;  Location: [place] [facility];  Service: Cardiovascular;  Laterality: N/A;  . LEFT HEART CATH AND CORONARY ANGIOGRAPHY N/A 12/12/2018   Procedure: LEFT HEART CATH AND CORONARY ANGIOGRAPHY;  Surgeon: Belva Crome, MD;  Location: [place] [facility];  Service: Cardiovascular;  Laterality: N/A;  . LEFT HEART CATH AND CORONARY ANGIOGRAPHY N/A 08/11/2019   Procedure: LEFT HEART CATH AND CORONARY ANGIOGRAPHY;  Surgeon: Burnell Blanks, MD;  Location: [place] [facility];  Service: Cardiovascular;  Laterality: N/A;   HPI:  63 yo f with a PMHx of CAD/STEMI in Oct. 2020 (s/p PCI), COPD on home O2, NSCLC s/p partial lung resection, colon ca, CHF presented 6/3 with shortness of breath and elevated troponin. Negative cardiac cath and treated for acute COPD exacerbation with improvement. 6/5  respiratory distress; admitted to ICU, intubated 6/5-6/6. Hypercarbic resp failure with reintubation 6/7-6/8. Reintubated 6/8 due to acidosis and resp distress, extubated 6/12. Bronch 6/10 revealed significant airway distortion and stenosis of the R airways likely due to fibrosis s/p RUL lobectomy and radiation.    Assessment / Plan / Recommendation Clinical Impression  Recent pain meds and not as alert as normal per RN. Strong reflexive cough with cuff deflation and cleared producing soft intensity verbalizations. Verbal feedback and demonstration provided and increased her intensity and intelligibility wearing speaking valve for 4-5 minute increments. She verbalized that she wanted a ribeye, orange Fanta and banana split". HR 96-98, RR 25-31 and SpO2 96%. When pt more awake she should be able to wear valve for longer periods and with less supervision however for now recommend she wear with RN/therapy with FULL supervision ensuring cuff is deflated.      SLP Visit Diagnosis: Aphonia (R49.1)    SLP Assessment  Patient needs continued Speech Lanaguage Pathology Services    Follow Up Recommendations   (TBD)    Frequency and Duration min 2x/week  2 weeks    PMSV Trial PMSV was placed for: 4-5 min intervals Able to redirect subglottic air through upper airway: Yes Able to Attain Phonation: Yes Voice Quality: Low vocal intensity Able to Expectorate Secretions: Yes Level of Secretion Expectoration with PMSV: Tracheal Breath Support for Phonation: Mildly decreased Intelligibility: Intelligibility reduced Word: 75-100% accurate Phrase: 50-74% accurate Respirations During Trial:  (25-31) SpO2 During Trial:  (96%) Pulse During Trial: 96 Behavior:  Lethargic;Controlled;Cooperative;Responsive to questions   Tracheostomy Tube       Vent Dependency  FiO2 (%): 40 %    Cuff Deflation Trial  GO Tolerated Cuff Deflation: Yes Length of Time for Cuff Deflation Trial: 25 Behavior: Responsive to  questions;Cooperative;Controlled (lethargic)        Houston Siren 09/18/2019, 2:35 PM  Orbie Pyo Colvin Caroli.Ed Risk analyst [phone] Office [phone]

## 2019-09-19 ENCOUNTER — Inpatient Hospital Stay (HOSPITAL_COMMUNITY): Payer: Medicare HMO

## 2019-09-19 DIAGNOSIS — J9602 Acute respiratory failure with hypercapnia: Secondary | ICD-10-CM

## 2019-09-19 LAB — BASIC METABOLIC PANEL
Anion gap: 13 (ref 5–15)
BUN: 25 mg/dL — ABNORMAL HIGH (ref 8–23)
CO2: 32 mmol/L (ref 22–32)
Calcium: 9.6 mg/dL (ref 8.9–10.3)
Chloride: 89 mmol/L — ABNORMAL LOW (ref 98–111)
Creatinine, Ser: 0.89 mg/dL (ref 0.44–1.00)
GFR calc Af Amer: >60 mL/min (ref >60)
GFR calc non Af Amer: >60 mL/min (ref >60)
Glucose, Bld: 200 mg/dL — ABNORMAL HIGH (ref 70–99)
Potassium: 2.9 mmol/L — ABNORMAL LOW (ref 3.5–5.1)
Sodium: 134 mmol/L — ABNORMAL LOW (ref 135–145)

## 2019-09-19 LAB — GLUCOSE, CAPILLARY
Glucose-Capillary: 176 mg/dL — ABNORMAL HIGH (ref 70–99)
Glucose-Capillary: 156 mg/dL — ABNORMAL HIGH (ref 70–99)
Glucose-Capillary: 102 mg/dL — ABNORMAL HIGH (ref 70–99)
Glucose-Capillary: 150 mg/dL — ABNORMAL HIGH (ref 70–99)
Glucose-Capillary: 122 mg/dL — ABNORMAL HIGH (ref 70–99)
Glucose-Capillary: 116 mg/dL — ABNORMAL HIGH (ref 70–99)

## 2019-09-19 LAB — CBC
HCT: 34 % — ABNORMAL LOW (ref 36.0–46.0)
Hemoglobin: 9.8 g/dL — ABNORMAL LOW (ref 12.0–15.0)
MCH: 26 pg (ref 26.0–34.0)
MCHC: 28.8 g/dL — ABNORMAL LOW (ref 30.0–36.0)
MCV: 90.2 fL (ref 80.0–100.0)
Platelets: 537 10*3/uL — ABNORMAL HIGH (ref 150–400)
RBC: 3.77 MIL/uL — ABNORMAL LOW (ref 3.87–5.11)
RDW: 18.4 % — ABNORMAL HIGH (ref 11.5–15.5)
WBC: 9.6 10*3/uL (ref 4.0–10.5)
nRBC: 0.3 % — ABNORMAL HIGH (ref 0.0–0.2)

## 2019-09-19 LAB — MAGNESIUM: Magnesium: 2.3 mg/dL (ref 1.7–2.4)

## 2019-09-19 LAB — PHOSPHORUS: Phosphorus: 4.1 mg/dL (ref 2.5–4.6)

## 2019-09-19 MED ORDER — POTASSIUM CHLORIDE 10 MEQ/100ML IV SOLN
10.0000 meq | INTRAVENOUS | Status: DC
  Filled 2019-09-19: qty 100

## 2019-09-19 MED ORDER — OXYCODONE HCL 5 MG PO TABS
10.0000 mg | ORAL_TABLET | Freq: Four times a day (QID) | ORAL | Status: DC
  Administered 2019-09-19 – 2019-09-26 (×25): 10 mg
  Filled 2019-09-19 (×25): qty 2

## 2019-09-19 MED ORDER — FREE WATER
100.0000 mL | Freq: Three times a day (TID) | Status: DC
  Administered 2019-09-19 – 2019-09-20 (×5): 100 mL

## 2019-09-19 MED ORDER — POTASSIUM CHLORIDE 20 MEQ/15ML (10%) PO SOLN
40.0000 meq | Freq: Once | ORAL | Status: AC
  Administered 2019-09-19: 40 meq
  Filled 2019-09-19: qty 30

## 2019-09-19 MED ORDER — ACETAMINOPHEN 160 MG/5ML PO SOLN
650.0000 mg | Freq: Four times a day (QID) | ORAL | Status: DC | PRN
  Administered 2019-09-19 – 2019-09-21 (×3): 650 mg
  Filled 2019-09-19 (×3): qty 20.3

## 2019-09-19 NOTE — TOC Initial Note (Signed)
Transition of Care [facility]) - Initial/Assessment Note    Patient Details  Name: Chelsea Allison MRN: [id number] Date of Birth: [birth year]  Transition of Care [facility]) CM/SW Contact:    Bartholomew Crews, RN Phone Number: [phone] 09/19/2019, 5:03 PM  Clinical Narrative:                  Advised by MD of patient appropriate for LTAC. Patient agreeable, but ultimately wants to return home with home health services. Patient states that she has used [facility] 4 times. Verified with [facility] that she is active for PT/OT. Will follow up with [facility] in AM concerning insurance authorization. TOC following for transition needs.   Expected Discharge Plan: Long Term Acute Care (LTAC)     Patient Goals and CMS Choice Patient states their goals for this hospitalization and ongoing recovery are:: agreeable to LTAC for 2 weeks then wants to go home CMS Medicare.gov Compare Post Acute Care list provided to:: Patient Choice offered to / list presented to : Patient  Expected Discharge Plan and Services Expected Discharge Plan: Long Term Acute Care (LTAC) In-house Referral: Clinical Social Work Discharge Planning Services: CM Consult ([facility]) Post Acute Care Choice: Long Term Acute Care (LTAC) Living arrangements for the past 2 months: Single Family Home                                      Prior Living Arrangements/Services Living arrangements for the past 2 months: Single Family Home Lives with:: Self Patient language and need for interpreter reviewed:: Yes            Current home services: Homehealth aide, Home PT Criminal Activity/Legal Involvement Pertinent to Current Situation/Hospitalization: No - Comment as needed  Activities of Daily Living      Permission Sought/Granted                  Emotional Assessment   Attitude/Demeanor/Rapport: Engaged Affect (typically observed): Accepting Orientation: : Oriented to Self, Oriented to  Time, Oriented to  Place, Oriented to Situation Alcohol / Substance Use: Not Applicable Psych Involvement: No (comment)  Admission diagnosis:  COPD exacerbation (HCC) [J44.1] Acute respiratory failure with hypercapnia (HCC) [J96.02] Acute on chronic respiratory failure with hypoxia and hypercapnia (HCC) [T01.60, J96.22] Patient Active Problem List   Diagnosis Date Noted  . Status post tracheostomy ([place])   . Pressure injury of skin 09/11/2019  . Acute on chronic respiratory failure with hypoxia and hypercapnia ([place]) 09/05/2019  . Ventricular tachycardia ([place]) 08/29/2019  . History of lung cancer in adulthood 08/29/2019  . History of anal cancer 08/29/2019  . Respiratory failure ([place]) 08/11/2019  . CHF exacerbation ([place]) 08/11/2019  . AKI (acute kidney injury) ([place]) 08/11/2019  . NSTEMI (non-ST elevated myocardial infarction) ([place])   . Elevated troponin   . Renal insufficiency   . Chronic combined systolic and diastolic heart failure ([place])   . Respiratory failure with hypoxia and hypercapnia ([place]) 01/08/2019  . Hyperlipidemia 12/20/2018  . Hypertension 12/20/2018  . CAP (community acquired pneumonia) 12/20/2018  . Tobacco abuse 12/20/2018  . CAD in native artery 12/20/2018  . Unstable angina ([place])   . Hypoxemia   . Acute respiratory failure with hypoxemia ([place])   . Acute pulmonary edema (HCC)   . COPD exacerbation ([place])   . STEMI (ST elevation myocardial infarction) ([place]) 12/12/2018  . Acute MI, lateral wall (  [place]) 12/12/2018  . Acute on chronic respiratory failure with hypercapnia ([place])   . Acute combined systolic and diastolic heart failure ([place])    PCP:  Beckie Salts, MD Pharmacy:   [place] [place] - [address] [place] [address] Phone: [phone] Fax: [phone]     Social Determinants of Health (SDOH) Interventions    Readmission Risk Interventions Readmission Risk Prevention Plan 01/11/2019 12/20/2018 12/20/2018  Transportation  Screening Complete - Complete  PCP or Specialist Appt within 5-7 Days - Complete -  PCP or Specialist Appt within 3-5 Days (No Data) - -  Home Care Screening - - Complete  Medication Review (RN CM) - - Complete  HRI or Home Care Consult Complete - -  Social Work Consult for [place] Planning/Counseling Complete - -  Palliative Care Screening Not Applicable - -  Medication Review (RN Care Manager) Complete - -  Some recent data might be hidden

## 2019-09-19 NOTE — Progress Notes (Signed)
Physical Therapy Treatment Patient Details Name: Chelsea Allison MRN: [id number] DOB: [birth year] Today's Date: 09/19/2019    History of Present Illness 64 yo female admitted from home 6/29 with respiratory distress and hypoxia.  Intubated in ED. Trach 7/6. Was in hospital from 6/03 to 6/18 for COPD exacerbation, acute on chronic systolic CHF with NSTEMI requiring intubation who D/Cd to rehab then home 6/22.  PMHx: anal CA s/p colostomy, NSCLC s/p partial resection, CAD, CHF    PT Comments    Pt supine on arrival reporting neck and head pain but willing to mobilize with encouragement and joking. Pt able to transfer to EOB with limited assist and stand but legs fatigue quickly in standing needing surfaces very close to transition between as well as assist for pericare in standing. Pt educated for HEP, progression and need to maximize mobility. Pt reports sore sacrum in chair and denied elevating legs or standing additional time to place cushion in chair. Will continue to follow  SpO2 95-98% on 8L fio2 40% trach collar  HR 105 BP 109/63 (75) sitting   Follow Up Recommendations  SNF;Supervision/Assistance - 24 hour     Equipment Recommendations  None recommended by PT    Recommendations for Other Services       Precautions / Restrictions Precautions Precautions: Fall Precaution Comments: trach, ostomy, sacral wounds    Mobility  Bed Mobility Overal bed mobility: Needs Assistance Bed Mobility: Supine to Sit     Supine to sit: Min assist;HOB elevated     General bed mobility comments: min assist to fully move legs to EOB and elevate trunk with HOB 30 degrees, increased time  Transfers Overall transfer level: Needs assistance   Transfers: Sit to/from Stand;Stand Pivot Transfers Sit to Stand: Mod assist         General transfer comment: pt initially stood from EOB with min assist but sat quickly. Stood again with mod assist to pivot to [facility]. Mod to stand from [facility] and support  self in standing with left knee blocked and BSC switched for recliner behind pt  Ambulation/Gait             General Gait Details: unable   Stairs             Wheelchair Mobility    Modified Rankin (Stroke Patients Only)       Balance Overall balance assessment: Needs assistance   Sitting balance-Leahy Scale: Good Sitting balance - Comments: EOB without UE support   Standing balance support: Bilateral upper extremity supported;Single extremity supported Standing balance-Leahy Scale: Poor Standing balance comment: single and bil UE support in standing with pivots                            Cognition Arousal/Alertness: Awake/alert Behavior During Therapy: WFL for tasks assessed/performed Overall Cognitive Status: Within Functional Limits for tasks assessed                                 General Comments: pt communicating through writing on paper, gestures and mouthing words      Exercises General Exercises - Lower Extremity Long Arc Quad: AROM;Both;Seated;10 reps Hip Flexion/Marching: AROM;Both;10 reps;Seated    General Comments        Pertinent Vitals/Pain Pain Assessment: Faces Pain Score: 4  Pain Location: neck and head Pain Descriptors / Indicators: Sore;Throbbing;Aching Pain Intervention(s): Limited activity within patient's tolerance;Monitored during  session;Repositioned    Home Living                      Prior Function            PT Goals (current goals can now be found in the care plan section) Progress towards PT goals: Progressing toward goals    Frequency    Min 3X/week      PT Plan Current plan remains appropriate    Co-evaluation PT/OT/SLP Co-Evaluation/Treatment: Yes Reason for Co-Treatment: Complexity of the patient's impairments (multi-system involvement);For patient/therapist safety PT goals addressed during session: Mobility/safety with mobility;Proper use of  DME;Strengthening/ROM        AM-PAC PT "6 Clicks" Mobility   Outcome Measure  Help needed turning from your back to your side while in a flat bed without using bedrails?: A Little Help needed moving from lying on your back to sitting on the side of a flat bed without using bedrails?: A Little Help needed moving to and from a bed to a chair (including a wheelchair)?: A Lot Help needed standing up from a chair using your arms (e.g., wheelchair or bedside chair)?: A Lot Help needed to walk in hospital room?: Total Help needed climbing 3-5 steps with a railing? : Total 6 Click Score: 12    End of Session Equipment Utilized During Treatment: Gait belt;Oxygen Activity Tolerance: Patient tolerated treatment well Patient left: in chair;with call bell/phone within reach;with chair alarm set Nurse Communication: Mobility status;Precautions PT Visit Diagnosis: Muscle weakness (generalized) (M62.81);Unsteadiness on feet (R26.81);Difficulty in walking, not elsewhere classified (R26.2)     Time: 3833-3832 PT Time Calculation (min) (ACUTE ONLY): 32 min  Charges:  $Therapeutic Activity: 8-22 mins                     Seraya Jobst P, PT Acute Rehabilitation Services Pager: [phone] Office: [phone]    Sandy Salaam Dacie Mandel 09/19/2019, 12:31 PM

## 2019-09-19 NOTE — Progress Notes (Signed)
NAME:  Chelsea Allison, MRN:  [id number], DOB:  1955-10-01, LOS: 62 ADMISSION DATE:  09/05/2019, CONSULTATION DATE:  09/05/19 REFERRING MD:  Stark Jock- EM, CHIEF COMPLAINT:  Respiratory Distress, AMS  Brief History   64 yo female smoker presented to ED with altered mental status from acute on chronic hypoxic/hypercapnic respiratory failure 2nd to HCAP in setting of COPD.  Past Medical History  NSCLS s/p partial resection Anal cancer s/p colostomy CAD with MI s/p PCI COPD on home O2 HFrEF Tobacco abuse  Acute on chronic respiratory failure requiring intubation, repeated   Significant Hospital Events   6/29 admitted to [facility]  7/1 hgb drop.  7/2 SBTx4h 7/4 only tolerated 42min on pressure support. Starting precedex/klonapin. Obtaining hemolytic labs. 7/6 trached 7/9: Copious secretions still noted.  Tachypneic.  Mildly hypotensive.  Presuming anxiety complicating weaning efforts titration of anxiolytic regimen performed, starting aerosol trach collar trials 7/10: Was on trach collar, pulse oximetry dropping to 40s supplemental oxygen increase but still on the needed achieve 70% FiO2 placed back on ventilator lavaged for pink-tan secretions saturations improved back on vent, checking sputum culture and chest x-ray. 7/11 looks better. Working on weaning   Procedures:  ETT 6/29-7/6 Tracheostomy 7/6  Significant Diagnostic Tests:  CXR 7/2: appropriate positioning of ETT, NG tubes, no acute overlying lung processes noted  CXR 7/6: new patchy left lung base opacity which could represent atelectasis vs aspiration vs pna, tracheostomy in place  Micro Data:  6/29 SARS Cov2>negative 6/29 tracheal aspirate>  6/29 UCx> less than 10,000 colonies insignificant growth 6/29 BCx> NG 6/29 MRSA PCR>negative 7/10: Sputum culture: E. coli, Klebsiella  Antimicrobials:  6/29 vancomycin>6/30 6/29 pip/tazo> 7/3 Ancef 7/12 >>   Interim history/subjective:  7/13: tolerating short periods of PMV. Cont to  c/o anxiety and chest tightness but falling asleep as I am attempting to discuss this further with her.   Objective   Blood pressure (!) 139/101, pulse (!) 101, temperature 100 F (37.8 C), temperature source Oral, resp. rate (!) 26, height 5\' 9"  (1.753 m), weight 79.9 kg, SpO2 98 %.    Vent Mode: PRVC FiO2 (%):  [35 %-40 %] 40 % Set Rate:  [21 bmp] 21 bmp Vt Set:  [520 mL] 520 mL PEEP:  [5 cmH20] 5 cmH20 Plateau Pressure:  [14 cmH20-18 cmH20] 18 cmH20   Intake/Output Summary (Last 24 hours) at 09/19/2019 0829 Last data filed at 09/19/2019 0600 Gross per 24 hour  Intake 2292.82 ml  Output 1175 ml  Net 1117.82 ml   Filed Weights   09/16/19 0401 09/17/19 0403 09/19/19 0600  Weight: 82.2 kg 81.1 kg 79.9 kg    Examination: Gen:      Chronically ill-appearing HEENT:  EOMI, sclera anicteric Neck:     No masses; no thyromegaly, trach Lungs:    Clear to auscultation bilaterally; normal respiratory effort CV:         Regular rate and rhythm; no murmurs Abd:      + bowel sounds; soft, non-tender; no palpable masses, no distension Ext:    No edema; adequate peripheral perfusion Skin:      Warm and dry; no rash Neuro: Awake, responsive  Labs reviewed significant for Tracheal aspirate now growing Klebsiella, E. coli which is pansensitive CBC, metabolic panel is stable No new imaging  Assessment & Plan:   Acute on chronic hypercapnic hypoxic respiratory failure requiring mechanical ventilation 2/2 HCAP, COPD exacerbation complicated by severe anxiety S/p tracheostomy 7/6 for inability to wean off vent  and repeated intubations.  -Anxiety, underlying lung mechanics, and copious pulmonary secretions currently barrier to progress.  Plan Pressure support weans as tolerated.  Working towards lengthening trach collar Vent support at night Cont Ancef (7 day total) for pansensitive E. coli and Klebsiella pneumonia Continue bronchodilators, Mucinex Out of bed, mobilize PT consult    Chronic cancer related pain and anxiety.  -her regimen is being adjusted.  Plan Continue buprenorphine patch, Klonopin,  Oxy and prn lorazepam  She is on a lot of sedating meds and we are attempting to decrease as she is falling asleep during exam despite stating she needs more medication.  -cont reduced klonopin from 2 mg tid to 1 mg tid -reduce oxy to q6 for now and follow tolerance  Chronic HFrEF w/ h/o CAD  Plan Continue telemetry  Continue aspirin Brilinta and Daily assessment for diuresis   Iron deficient Anemia.  Stable. No obvious source of bleeding. Plan Trend CBC and transfuse for hemoglobin less than 7  DM type 2. Blood sugars stable Plan Sliding scale insulin  Hx NSCLC s/p partial resection XLT. Hx anal cancer s/p colectomy. Plan Outpatient follow-up at some point  High risk malnutrition.  Plan Tube feeds  Hypokalemia:  -replace -mag 2.3  Hyponatremia:  -reduce fwf  Best practice:  Diet: tube feeds DVT prophylaxis: lovenox GI prophylaxis: protonix Mobility: PT Code Status: Full  Disposition:  ICU Family update: updated patient  Critical care time: The patient is critically ill with multiple organ systems failure and requires high complexity decision making for assessment and support, frequent evaluation and titration of therapies, application of advanced monitoring technologies and extensive interpretation of multiple databases.  Critical care time 38 mins. This represents my time independent of the NPs time taking care of the pt. This is excluding procedures.    Audria Nine DO [facility] 09/19/2019, 8:30 AM

## 2019-09-19 NOTE — Progress Notes (Signed)
Occupational Therapy Treatment Patient Details Name: Chelsea Allison MRN: [id number] DOB: 27-Jul-1955 Today's Date: 09/19/2019    History of present illness 64 yo female admitted from home 6/29 with respiratory distress and hypoxia.  Intubated in ED. Trach 7/6. Was in hospital from 6/03 to 6/18 for COPD exacerbation, acute on chronic systolic CHF with NSTEMI requiring intubation who D/Cd to rehab then home 6/22.  PMHx: anal CA s/p colostomy, NSCLC s/p partial resection, CAD, CHF   OT comments  Pt progressing to OOB BSC use and transferring to recliner. Pt limited by fatigue and feeling dizzy throughout session. Pt sitting for most of session. Pt transferred to recliner and BSC with modA overall. Pt unable to perform ADL safely in standing. BP 109/63, 105 BPM; 98% on trach collar FiO2 40%, 8L. Pt would benefit from continued OT skilled services. OT following acutely.   Follow Up Recommendations  SNF;Supervision/Assistance - 24 hour    Equipment Recommendations  Tub/shower seat    Recommendations for Other Services      Precautions / Restrictions Precautions Precautions: Fall Precaution Comments: trach, ostomy, sacral wounds       Mobility Bed Mobility Overal bed mobility: Needs Assistance Bed Mobility: Supine to Sit     Supine to sit: Min assist;HOB elevated     General bed mobility comments: min assist to fully move legs to EOB and elevate trunk with HOB 30 degrees, increased time  Transfers Overall transfer level: Needs assistance   Transfers: Sit to/from Stand;Stand Pivot Transfers Sit to Stand: Mod assist         General transfer comment: Pt requiring power-upA modA for sit to stand to [facility] and for pericare.    Balance Overall balance assessment: Needs assistance   Sitting balance-Leahy Scale: Good Sitting balance - Comments: EOB without UE support   Standing balance support: Bilateral upper extremity supported;Single extremity supported Standing balance-Leahy  Scale: Poor Standing balance comment: single and bil UE support in standing with pivots                           ADL either performed or assessed with clinical judgement   ADL Overall ADL's : Needs assistance/impaired     Grooming: Wash/dry hands;Wash/dry face;Brushing hair;Set up;Sitting Grooming Details (indicate cue type and reason): sitting in recliner                 Toilet Transfer: Moderate assistance;Stand-pivot;BSC   Toileting- Clothing Manipulation and Hygiene: Total assistance;+2 for physical assistance;+2 for safety/equipment;Sitting/lateral lean;Sit to/from stand       Functional mobility during ADLs: Moderate assistance;Cueing for safety;Rolling walker General ADL Comments: Pt limited by fatigue and feeling dizzy throughout session. Pt sitting for most of session. Pt transferred to recliner and BSC with modA overall. Pt unable to perform ADL safely in standing.     Vision   Vision Assessment?: No apparent visual deficits   Perception     Praxis      Cognition Arousal/Alertness: Awake/alert Behavior During Therapy: WFL for tasks assessed/performed Overall Cognitive Status: Within Functional Limits for tasks assessed                                 General Comments: pt communicating through writing on paper, gestures and mouthing words        Exercises     Shoulder Instructions       General Comments BP 109/63,  105 BPM; 98% on trach collar FiO2 40%, 8L.    Pertinent Vitals/ Pain       Pain Assessment: Faces Pain Score: 4  Pain Location: neck and head Pain Descriptors / Indicators: Sore;Throbbing;Aching Pain Intervention(s): Limited activity within patient's tolerance;Monitored during session  Home Living                                          Prior Functioning/Environment              Frequency  Min 2X/week        Progress Toward Goals  OT Goals(current goals can now be found in  the care plan section)  Progress towards OT goals: Progressing toward goals  Acute Rehab OT Goals Patient Stated Goal: go home OT Goal Formulation: With patient Time For Goal Achievement: 09/27/19 Potential to Achieve Goals: Good ADL Goals Pt Will Perform Grooming: standing;with min assist Pt Will Perform Lower Body Bathing: with supervision;sitting/lateral leans;sit to/from stand Pt Will Perform Lower Body Dressing: sit to/from stand;with adaptive equipment;with min guard assist Pt Will Transfer to Toilet: with min guard assist;stand pivot transfer Pt Will Perform Toileting - Clothing Manipulation and hygiene: with supervision;sit to/from stand;sitting/lateral leans Pt/caregiver will Perform Home Exercise Program: Increased strength;Both right and left upper extremity;With written HEP provided;With theraband;Independently Additional ADL Goal #1: Pt will demonstrate independence with 3 energy conservation strategies during ADL/IADL completion.  Plan Discharge plan remains appropriate    Co-evaluation    PT/OT/SLP Co-Evaluation/Treatment: Yes Reason for Co-Treatment: Complexity of the patient's impairments (multi-system involvement);To address functional/ADL transfers   OT goals addressed during session: ADL's and self-care      AM-PAC OT "6 Clicks" Daily Activity     Outcome Measure   Help from another person eating meals?: Total (NPO) Help from another person taking care of personal grooming?: A Little Help from another person toileting, which includes using toliet, bedpan, or urinal?: Total Help from another person bathing (including washing, rinsing, drying)?: A Lot Help from another person to put on and taking off regular upper body clothing?: A Little Help from another person to put on and taking off regular lower body clothing?: A Lot 6 Click Score: 12    End of Session Equipment Utilized During Treatment: Gait belt;Oxygen  OT Visit Diagnosis: Muscle weakness  (generalized) (M62.81);Unsteadiness on feet (R26.81)   Activity Tolerance Patient limited by fatigue;Patient tolerated treatment well   Patient Left in chair;with call bell/phone within reach;with chair alarm set   Nurse Communication Mobility status        Time: 3875-6433 OT Time Calculation (min): 34 min  Charges: OT General Charges $OT Visit: 1 Visit OT Treatments $Self Care/Home Management : 8-22 mins  Jefferey Pica, OTR/L Acute Rehabilitation Services Pager: [phone] Office: [phone]    Jaylean Buenaventura C 09/19/2019, 5:51 PM

## 2019-09-20 LAB — GLUCOSE, CAPILLARY
Glucose-Capillary: 147 mg/dL — ABNORMAL HIGH (ref 70–99)
Glucose-Capillary: 140 mg/dL — ABNORMAL HIGH (ref 70–99)
Glucose-Capillary: 146 mg/dL — ABNORMAL HIGH (ref 70–99)
Glucose-Capillary: 143 mg/dL — ABNORMAL HIGH (ref 70–99)
Glucose-Capillary: 113 mg/dL — ABNORMAL HIGH (ref 70–99)

## 2019-09-20 LAB — BASIC METABOLIC PANEL
Anion gap: 11 (ref 5–15)
BUN: 22 mg/dL (ref 8–23)
CO2: 33 mmol/L — ABNORMAL HIGH (ref 22–32)
Calcium: 9.4 mg/dL (ref 8.9–10.3)
Chloride: 95 mmol/L — ABNORMAL LOW (ref 98–111)
Creatinine, Ser: 0.68 mg/dL (ref 0.44–1.00)
GFR calc Af Amer: >60 mL/min (ref >60)
GFR calc non Af Amer: >60 mL/min (ref >60)
Glucose, Bld: 114 mg/dL — ABNORMAL HIGH (ref 70–99)
Potassium: 3.5 mmol/L (ref 3.5–5.1)
Sodium: 139 mmol/L (ref 135–145)

## 2019-09-20 MED ORDER — CLONAZEPAM 1 MG PO TABS
1.0000 mg | ORAL_TABLET | Freq: Two times a day (BID) | ORAL | Status: DC
  Administered 2019-09-20 – 2019-09-23 (×8): 1 mg
  Filled 2019-09-20 (×8): qty 1

## 2019-09-20 NOTE — Evaluation (Signed)
Clinical/Bedside Swallow Evaluation Patient Details  Name: Chelsea Allison MRN: [id number] Date of Birth: 1955/07/15  Today's Date: 09/20/2019 Time: SLP Start Time (ACUTE ONLY): 90 SLP Stop Time (ACUTE ONLY): 0940 SLP Time Calculation (min) (ACUTE ONLY): 20 min  Past Medical History:  Past Medical History:  Diagnosis Date  . CAD (coronary artery disease) 10/20 DES to pRCA, DES mCx   . Cancer of lung ([place])    Twice  . Colon cancer ([place])   . COPD (chronic obstructive pulmonary disease) ([place])   . Hyperlipidemia   . Hypertension   . Ischemic cardiomyopathy   . Neuropathy    Secondary to chemo  . STEMI (ST elevation myocardial infarction) ([place]) 12/12/2018  . Tobacco abuse    Past Surgical History:  Past Surgical History:  Procedure Laterality Date  . ABDOMINAL SURGERY     For colon cancer  . COLOSTOMY    . CORONARY STENT INTERVENTION N/A 12/20/2018   Procedure: CORONARY STENT INTERVENTION - Right;  Surgeon: Burnell Blanks, MD;  Location: [facility];  Service: Cardiovascular;  Laterality: N/A;  . CORONARY/GRAFT ACUTE MI REVASCULARIZATION N/A 12/12/2018   Procedure: Coronary/Graft Acute MI Revascularization;  Surgeon: Belva Crome, MD;  Location: [facility];  Service: Cardiovascular;  Laterality: N/A;  . LEFT HEART CATH AND CORONARY ANGIOGRAPHY N/A 12/12/2018   Procedure: LEFT HEART CATH AND CORONARY ANGIOGRAPHY;  Surgeon: Belva Crome, MD;  Location: [facility];  Service: Cardiovascular;  Laterality: N/A;  . LEFT HEART CATH AND CORONARY ANGIOGRAPHY N/A 08/11/2019   Procedure: LEFT HEART CATH AND CORONARY ANGIOGRAPHY;  Surgeon: Burnell Blanks, MD;  Location: [facility];  Service: Cardiovascular;  Laterality: N/A;   HPI:  64 yo f with a PMHx of CAD/STEMI in Oct. 2020 (s/p PCI), COPD on home O2, NSCLC s/p partial lung resection, colon ca, CHF presented 6/3 with shortness of breath and elevated troponin. Negative cardiac cath and treated  for acute COPD exacerbation with improvement. 6/5 respiratory distress; admitted to ICU, intubated 6/5-6/6. Hypercarbic resp failure with reintubation 6/7-6/8. Reintubated 6/8 due to acidosis and resp distress, extubated 6/12. Bronch 6/10 revealed significant airway distortion and stenosis of the R airways likely due to fibrosis s/p RUL lobectomy and radiation. 7/6 trached.    Assessment / Plan / Recommendation Clinical Impression  Pt demonstrates promising signs of abilities to swallow, but still has very dysphonic vocal quality and weak cough warranting instrumental assessment, particularly given fragility of lungs. When given trials of ice and water, there is slight throat clearing, but primarily a report of throt and dental pain. Pt not interested in advanced trials. Recommend pt continue Cortrak. Will consider FEES tomorrow for instrumental evaluation.  SLP Visit Diagnosis: Dysphagia, pharyngeal phase (R13.13)    Aspiration Risk  Moderate aspiration risk    Diet Recommendation NPO;Alternative means - temporary   Medication Administration: Via alternative means    Other  Recommendations     Follow up Recommendations Skilled Nursing facility      Frequency and Duration            Prognosis        Swallow Study   General HPI: 65 yo f with a PMHx of CAD/STEMI in Oct. 2020 (s/p PCI), COPD on home O2, NSCLC s/p partial lung resection, colon ca, CHF presented 6/3 with shortness of breath and elevated troponin. Negative cardiac cath and treated for acute COPD exacerbation with improvement. 6/5 respiratory distress; admitted to ICU, intubated  6/5-6/6. Hypercarbic resp failure with reintubation 6/7-6/8. Reintubated 6/8 due to acidosis and resp distress, extubated 6/12. Bronch 6/10 revealed significant airway distortion and stenosis of the R airways likely due to fibrosis s/p RUL lobectomy and radiation. 7/6 trached.  Type of Study: Bedside Swallow Evaluation Previous Swallow Assessment: Oct  2020 Dys 3/thin, after intubation Diet Prior to this Study: Regular History of Recent Intubation: Yes Length of Intubations (days): 2 days Date extubated: 09/11/19 Behavior/Cognition: Lethargic/Drowsy Oral Cavity Assessment: Within Functional Limits Oral Care Completed by SLP: No Oral Cavity - Dentition: Poor condition;Missing dentition Vision: Functional for self-feeding Patient Positioning: Upright in bed Baseline Vocal Quality: Hoarse;Low vocal intensity;Breathy Volitional Swallow: Able to elicit    Oral/Motor/Sensory Function Overall Oral Motor/Sensory Function: Within functional limits   Ice Chips Ice chips: Impaired Presentation: Spoon Pharyngeal Phase Impairments: Throat Clearing - Immediate   Thin Liquid Thin Liquid: Impaired Presentation: Straw;Spoon Pharyngeal  Phase Impairments: Throat Clearing - Immediate    Nectar Thick Nectar Thick Liquid: Not tested   Honey Thick Honey Thick Liquid: Not tested   Puree Puree: Not tested   Solid     Solid: Not tested     Herbie Baltimore, MA CCC-SLP  Acute Rehabilitation Services Pager [phone] Office [phone]  Lynann Beaver 09/20/2019,9:50 AM

## 2019-09-20 NOTE — Progress Notes (Signed)
  Speech Language Pathology Treatment: Nada Boozer Speaking valve  Patient Details Name: Chelsea Allison MRN: [id number] DOB: 03/31/1955 Today's Date: 09/20/2019 Time: 3382-5053 SLP Time Calculation (min) (ACUTE ONLY): 37 min  Assessment / Plan / Recommendation Clinical Impression  Pt lethargic, but able to participate in with increased interaction. Cuff inflated at baseline but pt has not been on vent for two days. Cuff deflated with pt able to expectorate secretions tracheally without assist. PMSV placed, for over 30 minutes with no change in vital signs. PMSV left in place after session and vital monitored indirectly, still with nochange. Airflow to upper airway good. Pt able to phonate, but breath support inadequate for intelligible volume. Set a goal for audible y/n, pt met this goal in 8/10 trials with a verbal reminder each attempt. 64 may wear PMSV with close intermittent supervision in ICU today (indirect monitoring of vitals) for potential use with intermittent supervision after transfer out of ICU. Will continue to need cued to communicate with adequate breath support.    HPI HPI: 64 yo f with a PMHx of CAD/STEMI in Oct. 2020 (s/p PCI), COPD on home O2, NSCLC s/p partial lung resection, colon ca, CHF presented 6/3 with shortness of breath and elevated troponin. Negative cardiac cath and treated for acute COPD exacerbation with improvement. 6/5 respiratory distress; admitted to ICU, intubated 6/5-6/6. Hypercarbic resp failure with reintubation 6/7-6/8. Reintubated 6/8 due to acidosis and resp distress, extubated 6/12. Bronch 6/10 revealed significant airway distortion and stenosis of the R airways likely due to fibrosis s/p RUL lobectomy and radiation. 7/6 trached.       SLP Plan          Recommendations  Diet recommendations: NPO Medication Administration: Via alternative means                Follow up Recommendations: Skilled Nursing facility SLP Visit Diagnosis: Dysphagia,  pharyngeal phase (R13.13);[place] (R49.1)       GO              Chelsea Baltimore, MA CCC-SLP  Acute Rehabilitation Services Pager [phone] Office [phone]   Lynann Beaver 09/20/2019, 9:58 AM

## 2019-09-20 NOTE — Progress Notes (Signed)
NAME:  Chelsea Allison, MRN:  [id number], DOB:  02/22/1956, LOS: 61 ADMISSION DATE:  09/05/2019, CONSULTATION DATE:  09/05/19 REFERRING MD:  Stark Jock- EM, CHIEF COMPLAINT:  Respiratory Distress, AMS  Brief History   64 yo female smoker presented to ED with altered mental status from acute on chronic hypoxic/hypercapnic respiratory failure 2nd to HCAP in setting of COPD.  Past Medical History  NSCLS s/p partial resection Anal cancer s/p colostomy CAD with MI s/p PCI COPD on home O2 HFrEF Tobacco abuse  Acute on chronic respiratory failure requiring intubation, repeated   Significant Hospital Events   6/29 admitted to [facility]  7/1 hgb drop.  7/2 SBTx4h 7/4 only tolerated 46min on pressure support. Starting precedex/klonapin. Obtaining hemolytic labs. 7/6 trached 7/9: Copious secretions still noted.  Tachypneic.  Mildly hypotensive.  Presuming anxiety complicating weaning efforts titration of anxiolytic regimen performed, starting aerosol trach collar trials 7/10: Was on trach collar, pulse oximetry dropping to 40s supplemental oxygen increase but still on the needed achieve 70% FiO2 placed back on ventilator lavaged for pink-tan secretions saturations improved back on vent, checking sputum culture and chest x-ray. 7/11 looks better. Working on weaning   Procedures:  ETT 6/29-7/6 Tracheostomy 7/6  Significant Diagnostic Tests:  CXR 7/2: appropriate positioning of ETT, NG tubes, no acute overlying lung processes noted  CXR 7/6: new patchy left lung base opacity which could represent atelectasis vs aspiration vs pna, tracheostomy in place  Micro Data:  6/29 SARS Cov2>negative 6/29 tracheal aspirate>  6/29 UCx> less than 10,000 colonies insignificant growth 6/29 BCx> NG 6/29 MRSA PCR>negative 7/10: Sputum culture: E. coli, Klebsiella  Antimicrobials:  6/29 vancomycin>6/30 6/29 pip/tazo> 7/3 Ancef 7/12 >>   Interim history/subjective:  7/14: tmax 100. Drowsy this am on atc.  7/13:  tolerating short periods of PMV. Cont to c/o anxiety and chest tightness but falling asleep as I am attempting to discuss this further with her.   Objective   Blood pressure (!) 108/55, pulse 95, temperature 98.7 F (37.1 C), temperature source Oral, resp. rate (!) 24, height 5\' 9"  (1.753 m), weight 79.5 kg, SpO2 99 %.    FiO2 (%):  [35 %-40 %] 40 %   Intake/Output Summary (Last 24 hours) at 09/20/2019 0756 Last data filed at 09/20/2019 0600 Gross per 24 hour  Intake 2174.91 ml  Output 2110 ml  Net 64.91 ml   Filed Weights   09/17/19 0403 09/19/19 0600 09/20/19 0500  Weight: 81.1 kg 79.9 kg 79.5 kg    Examination: Gen:      Chronically ill-appearing, drowsy HEENT:  EOMI, sclera anicteric Neck:     No masses; no thyromegaly, trach Lungs:    Clear to auscultation bilaterally; normal respiratory effort CV:         Regular rate and rhythm; no murmurs Abd:      + bowel sounds; soft, non-tender; no palpable masses, no distension Ext:    No edema; adequate peripheral perfusion Skin:      Warm and dry; no rash Neuro: Arousable, responsive but drifts back off to sleep. Non focal exam.   Labs pending this am.   Assessment & Plan:   Acute on chronic hypercapnic hypoxic respiratory failure requiring mechanical ventilation 2/2 HCAP, COPD exacerbation complicated by severe anxiety S/p tracheostomy 7/6 for inability to wean off vent and repeated intubations.  -Anxiety, underlying lung mechanics, and copious pulmonary secretions currently barrier to progress.  Plan Pressure support weans as tolerated.  Working towards lengthening trach collar  Vent support at night Cont Ancef (7 day total) for pansensitive E. coli and Klebsiella pneumonia Continue bronchodilators, Mucinex Out of bed, mobilize PT consult /OT consult -approaching LTACH's for her if possible with CM  Chronic cancer related pain and anxiety.  -her regimen is being adjusted.  Plan Continue buprenorphine patch, Klonopin,   Oxy and prn lorazepam  She is on a lot of sedating meds and we are attempting to decrease as she is falling asleep during exam despite stating she needs more medication.  -further reduce klonopin 1mg  bid -cont reduced oxy to q6 for now and follow tolerance -d/c prn  Chronic HFrEF w/ h/o CAD  Plan Continue telemetry  Continue aspirin Brilinta and Daily assessment for diuresis   Iron deficient Anemia.  Stable. No obvious source of bleeding. Plan Trend CBC intermittently and transfuse for hemoglobin less than 7  DM type 2. Blood sugars stable Plan Sliding scale insulin  Hx NSCLC s/p partial resection XLT. Hx anal cancer s/p colectomy. Plan Outpatient follow-up at some point  High risk malnutrition.  Plan Tube feeds  Hypokalemia:  -replaced, labs pending this am.  -mag 2.3  Hyponatremia:  -reduce fwf -await labs this am to determine plan.   Best practice:  Diet: tube feeds DVT prophylaxis: lovenox GI prophylaxis: protonix Mobility: PT Code Status: Full  Disposition:  ICU Family update: updated patient  Critical care time: The patient is critically ill with multiple organ systems failure and requires high complexity decision making for assessment and support, frequent evaluation and titration of therapies, application of advanced monitoring technologies and extensive interpretation of multiple databases.  Critical care time 36 mins. This represents my time independent of the NPs time taking care of the pt. This is excluding procedures.    [facility] 09/20/2019, 7:56 AM

## 2019-09-21 LAB — BASIC METABOLIC PANEL
Anion gap: 10 (ref 5–15)
BUN: 20 mg/dL (ref 8–23)
CO2: 34 mmol/L — ABNORMAL HIGH (ref 22–32)
Calcium: 9.3 mg/dL (ref 8.9–10.3)
Chloride: 94 mmol/L — ABNORMAL LOW (ref 98–111)
Creatinine, Ser: 0.72 mg/dL (ref 0.44–1.00)
GFR calc Af Amer: >60 mL/min (ref >60)
GFR calc non Af Amer: >60 mL/min (ref >60)
Glucose, Bld: 156 mg/dL — ABNORMAL HIGH (ref 70–99)
Potassium: 3.9 mmol/L (ref 3.5–5.1)
Sodium: 138 mmol/L (ref 135–145)

## 2019-09-21 LAB — GLUCOSE, CAPILLARY
Glucose-Capillary: 120 mg/dL — ABNORMAL HIGH (ref 70–99)
Glucose-Capillary: 130 mg/dL — ABNORMAL HIGH (ref 70–99)
Glucose-Capillary: 133 mg/dL — ABNORMAL HIGH (ref 70–99)
Glucose-Capillary: 137 mg/dL — ABNORMAL HIGH (ref 70–99)
Glucose-Capillary: 142 mg/dL — ABNORMAL HIGH (ref 70–99)
Glucose-Capillary: 149 mg/dL — ABNORMAL HIGH (ref 70–99)
Glucose-Capillary: 89 mg/dL (ref 70–99)

## 2019-09-21 MED ORDER — PROSOURCE TF PO LIQD
45.0000 mL | Freq: Three times a day (TID) | ORAL | Status: DC
  Administered 2019-09-21 – 2019-10-04 (×40): 45 mL
  Filled 2019-09-21 (×44): qty 45

## 2019-09-21 MED ORDER — TICAGRELOR 90 MG PO TABS
90.0000 mg | ORAL_TABLET | Freq: Two times a day (BID) | ORAL | Status: DC
  Administered 2019-09-21 – 2019-10-04 (×26): 90 mg
  Filled 2019-09-21 (×28): qty 1

## 2019-09-21 MED ORDER — FREE WATER
50.0000 mL | Freq: Three times a day (TID) | Status: DC
  Administered 2019-09-21 – 2019-09-29 (×23): 50 mL

## 2019-09-21 MED ORDER — QUETIAPINE FUMARATE 25 MG PO TABS
25.0000 mg | ORAL_TABLET | Freq: Two times a day (BID) | ORAL | Status: DC
  Administered 2019-09-21 – 2019-09-25 (×9): 25 mg
  Filled 2019-09-21 (×10): qty 1

## 2019-09-21 NOTE — Progress Notes (Signed)
Physical Therapy Treatment Patient Details Name: Chelsea Allison MRN: [id number] DOB: [birth year] Today's Date: 09/21/2019    History of Present Illness 64 yo female admitted from home 6/29 with respiratory distress and hypoxia.  Intubated in ED. Trach 7/6. Was in hospital from 6/03 to 6/18 for COPD exacerbation, acute on chronic systolic CHF with NSTEMI requiring intubation who D/Cd to rehab then home 6/22.  PMHx: anal CA s/p colostomy, NSCLC s/p partial resection, CAD, CHF    PT Comments    Pt supine on arrival stating pain in head and neck with RN notified of request for medication. Pt with improved ability with standing and pivot able to perform with mod assist with face to face technique with knees blocked with +2 for safety. Pt continues to fatigue quickly and with stepping during pivot sits prematurely. Pt educated for need to continue progressing standing tolerance and LE strength to return to PLOF.   Pt on 10L 60% fio2 trach collar HR 105    Follow Up Recommendations  SNF;Supervision/Assistance - 24 hour     Equipment Recommendations  None recommended by PT    Recommendations for Other Services       Precautions / Restrictions Precautions Precautions: Fall Precaution Comments: trach, ostomy, sacral wounds    Mobility  Bed Mobility Overal bed mobility: Needs Assistance Bed Mobility: Supine to Sit     Supine to sit: Min assist;HOB elevated     General bed mobility comments: min assist to fully move legs to EOB and elevate trunk with HOB 30 degrees  Transfers Overall transfer level: Needs assistance   Transfers: Sit to/from Stand;Stand Pivot Transfers Sit to Stand: Mod assist Stand pivot transfers: Mod assist;+2 safety/equipment       General transfer comment: pt required mod assist with counting to rise from bed and pivot to [facility] with face to face technique and knees blocked with 2nd person to guide hips and manage lines. Pt performed bed>BSC>recliner toward right  with mod assist for static standing for pericare  Ambulation/Gait             General Gait Details: unable   Stairs             Wheelchair Mobility    Modified Rankin (Stroke Patients Only)       Balance Overall balance assessment: Needs assistance   Sitting balance-Leahy Scale: Good Sitting balance - Comments: EOB without UE support   Standing balance support: Bilateral upper extremity supported Standing balance-Leahy Scale: Poor Standing balance comment: bil UE support with knees blocked                            Cognition Arousal/Alertness: Awake/alert Behavior During Therapy: WFL for tasks assessed/performed Overall Cognitive Status: Within Functional Limits for tasks assessed                                 General Comments: pt communicating through writing on paper, gestures and mouthing words with PMSV utilized end of session      Exercises General Exercises - Lower Extremity Long Arc Quad: AROM;Both;Seated;15 reps Hip Flexion/Marching: AROM;Both;Seated;15 reps    General Comments        Pertinent Vitals/Pain Pain Score: 6  Pain Location: mouth and neck Pain Descriptors / Indicators: Sore;Throbbing;Aching Pain Intervention(s): Limited activity within patient's tolerance;Monitored during session;Patient requesting pain meds-RN notified;Repositioned    Home Living  Prior Function            PT Goals (current goals can now be found in the care plan section) Progress towards PT goals: Progressing toward goals    Frequency    Min 3X/week      PT Plan Current plan remains appropriate    Co-evaluation              AM-PAC PT "6 Clicks" Mobility   Outcome Measure  Help needed turning from your back to your side while in a flat bed without using bedrails?: A Little Help needed moving from lying on your back to sitting on the side of a flat bed without using bedrails?: A  Little Help needed moving to and from a bed to a chair (including a wheelchair)?: A Lot Help needed standing up from a chair using your arms (e.g., wheelchair or bedside chair)?: A Lot Help needed to walk in hospital room?: Total Help needed climbing 3-5 steps with a railing? : Total 6 Click Score: 12    End of Session Equipment Utilized During Treatment: Gait belt;Oxygen Activity Tolerance: Patient tolerated treatment well Patient left: in chair;with call bell/phone within reach;with chair alarm set Nurse Communication: Mobility status;Precautions PT Visit Diagnosis: Muscle weakness (generalized) (M62.81);Unsteadiness on feet (R26.81);Difficulty in walking, not elsewhere classified (R26.2)     Time: 2751-7001 PT Time Calculation (min) (ACUTE ONLY): 30 min  Charges:  $Therapeutic Exercise: 8-22 mins $Therapeutic Activity: 8-22 mins                     Chelsea Allison P, PT Acute Rehabilitation Services Pager: [phone] Office: [phone]    Chelsea Allison B Chelsea Allison 09/21/2019, 1:19 PM

## 2019-09-21 NOTE — TOC Progression Note (Signed)
Transition of Care [facility]) - Progression Note    Patient Details  Name: Chelsea Allison MRN: [id number] Date of Birth: 1956-02-17  Transition of [facility] CM/SW Contact  Bartholomew Crews, RN Phone Number: [phone] 09/21/2019, 1:05 PM  Clinical Narrative:     Spoke with patient at the bedside. Patient up in chair on TC. Discussed transition plans to LTAC. Reinforced purpose of LTAC , and that ultimate goal was for patient to return to her home. Patient lives alone, but has family and caregivers available to assist. NCM offered to reach out to a family member to discuss transition plans, however, patient declined. TOC following for transition needs.  Expected Discharge Plan: Long Term Acute Care (LTAC)    Expected Discharge Plan and Services Expected Discharge Plan: Long Term Acute Care (LTAC) In-house Referral: Clinical Social Work Discharge Planning Services: CM Consult ([facility]) Post Acute Care Choice: Long Term Acute Care (LTAC) Living arrangements for the past 2 months: Single Family Home                                       Social Determinants of Health (SDOH) Interventions    Readmission Risk Interventions Readmission Risk Prevention Plan 01/11/2019 12/20/2018 12/20/2018  Transportation Screening Complete - Complete  PCP or Specialist Appt within 5-7 Days - Complete -  PCP or Specialist Appt within 3-5 Days (No Data) - -  Home Care Screening - - Complete  Medication Review (RN CM) - - Complete  HRI or Home Care Consult Complete - -  Social Work Consult for [place] Planning/Counseling Complete - -  Palliative Care Screening Not Applicable - -  Medication Review (RN Care Manager) Complete - -  Some recent data might be hidden

## 2019-09-21 NOTE — Progress Notes (Signed)
CPT not done at this time. Patient is sitting in a chair.

## 2019-09-21 NOTE — Progress Notes (Signed)
NAME:  Chelsea Allison, MRN:  [id number], DOB:  09-Dec-1955, LOS: 29 ADMISSION DATE:  09/05/2019, CONSULTATION DATE:  09/05/19 REFERRING MD:  Stark Jock- EM, CHIEF COMPLAINT:  Respiratory Distress, AMS  Brief History   64 yo female smoker presented to ED with altered mental status from acute on chronic hypoxic/hypercapnic respiratory failure 2nd to HCAP in setting of COPD.  Past Medical History  NSCLS s/p partial resection Anal cancer s/p colostomy CAD with MI s/p PCI COPD on home O2 HFrEF Tobacco abuse  Acute on chronic respiratory failure requiring intubation, repeated   Significant Hospital Events   6/29 admitted to [facility]  7/1 hgb drop.  7/2 SBTx4h 7/4 only tolerated 24min on pressure support. Starting precedex/klonapin. Obtaining hemolytic labs. 7/6 trached 7/9: Copious secretions still noted.  Tachypneic.  Mildly hypotensive.  Presuming anxiety complicating weaning efforts titration of anxiolytic regimen performed, starting aerosol trach collar trials 7/10: Was on trach collar, pulse oximetry dropping to 40s supplemental oxygen increase but still on the needed achieve 70% FiO2 placed back on ventilator lavaged for pink-tan secretions saturations improved back on vent, checking sputum culture and chest x-ray. 7/11 looks better. Working on weaning   Procedures:  ETT 6/29-7/6 Tracheostomy 7/6  Significant Diagnostic Tests:  CXR 7/2: appropriate positioning of ETT, NG tubes, no acute overlying lung processes noted  CXR 7/6: new patchy left lung base opacity which could represent atelectasis vs aspiration vs pna, tracheostomy in place  Micro Data:  6/29 SARS Cov2>negative 6/29 tracheal aspirate>  6/29 UCx> less than 10,000 colonies insignificant growth 6/29 BCx> NG 6/29 MRSA PCR>negative 7/10: Sputum culture: E. coli, Klebsiella  Antimicrobials:  6/29 vancomycin>6/30 6/29 pip/tazo> 7/3 Ancef 7/12 >>   Interim history/subjective:  7/15: awaiting placement for ltach, tolerated  atc >48hr now. Longer pmv trials as well. tmax 98.7. more alert today, continuing to decrease sedating meds.  7/14: tmax 100. Drowsy this am on atc.  7/13: tolerating short periods of PMV. Cont to c/o anxiety and chest tightness but falling asleep as I am attempting to discuss this further with her.   Objective   Blood pressure (!) 83/43, pulse 96, temperature 98.9 F (37.2 C), temperature source Oral, resp. rate (!) 26, height 5\' 9"  (1.753 m), weight 79.5 kg, SpO2 96 %.    FiO2 (%):  [40 %] 40 %   Intake/Output Summary (Last 24 hours) at 09/21/2019 0753 Last data filed at 09/21/2019 0600 Gross per 24 hour  Intake 883.54 ml  Output 700 ml  Net 183.54 ml   Filed Weights   09/17/19 0403 09/19/19 0600 09/20/19 0500  Weight: 81.1 kg 79.9 kg 79.5 kg    Examination: Gen:      Chronically ill-appearing, more awake today on trach collar HEENT:  Ncat, EOMI, sclera anicteric Neck:     No masses; no thyromegaly, trach Lungs:   Coarse breath sounds bilaterally; normal respiratory effort CV:         Regular rate and rhythm; no murmurs Abd:      + bowel sounds; soft, non-tender; no palpable masses, no distension Ext:    + edema; adequate peripheral perfusion Skin:      Warm and dry; no rash Neuro: more alert and following commands. Non focal exam.   Na 138, Cr 0.72  Assessment & Plan:   Acute on chronic hypercapnic hypoxic respiratory failure requiring mechanical ventilation 2/2 HCAP, COPD exacerbation complicated by severe anxiety S/p tracheostomy 7/6 for inability to wean off vent and repeated intubations.  -  Anxiety, underlying lung mechanics, and copious pulmonary secretions currently barrier to progress.  Plan 48h atc Cont Ancef (7 day total) for pansensitive E. coli and Klebsiella pneumonia Continue bronchodilators, Mucinex Out of bed, mobilize PT consult /OT consult -approaching LTACH's for her if possible with CM   Chronic cancer related pain and anxiety.  -her regimen is  being adjusted.  Plan Continue buprenorphine patch, Klonopin,  Oxy and prn lorazepam  She is on a lot of sedating meds and we are attempting to decrease as she is falling asleep during exam despite stating she needs more medication.  -cont reduce klonopin 1mg  bid -cont reduced oxy to q6 for now and follow tolerance -decrease seroquel to 25mg  bid -stopped prn  Chronic HFrEF w/ h/o CAD  Plan Continue telemetry  Continue aspirin Brilinta and Daily assessment for diuresis   Iron deficient Anemia.  Stable. No obvious source of bleeding. Plan Trend CBC intermittently and transfuse for hemoglobin less than 7  DM type 2. Blood sugars stable Plan Sliding scale insulin  Hx NSCLC s/p partial resection XLT. Hx anal cancer s/p colectomy. Plan Outpatient follow-up at some point  High risk malnutrition.  Plan Tube feeds  Hypokalemia:  -replaced -mag 2.3 at last check  Hyponatremia:  -reduce fwf further   Best practice:  Diet: tube feeds DVT prophylaxis: lovenox GI prophylaxis: protonix Mobility: PT Code Status: Full  Disposition:  Transfer from ICU. TRH to assume care, ccm will follow peripherally for trach maintenance Family update: updated patient  care time:  care time 35 mins. This represents my time independent of the NPs time taking care of the pt. This is excluding procedures.    [facility] 09/21/2019, 7:53 AM

## 2019-09-21 NOTE — Progress Notes (Signed)
Nutrition Follow-up  DOCUMENTATION CODES:   Not applicable  INTERVENTION:   Tube Feedingvia Cortrak: Vital 1.5 at 60 ml/hr Change to Pro-Source TF 45 mL TID Provides 130  g of protein, 2280 kcals and 1094 mL of free water Meets 100% estimated calorie and protein needs  Continue Vit C and zinc   NUTRITION DIAGNOSIS:   Inadequate oral intake related to acute illness as evidenced by NPO status.  Being addressed with TF   GOAL:   Patient will meet greater than or equal to 90% of their needs  MEt  MONITOR:   Vent status, Diet advancement, Labs, Weight trends, Skin  REASON FOR ASSESSMENT:   Consult, Ventilator Enteral/tube feeding initiation and management  ASSESSMENT:   64 yo female admitted with acute on chronic respiratory failure from HCAP requiring intubation PMH includes NSCLS s/p partial resection, anal cancer s/p colostomy, acute on chronic respiratory failure requiring repeat intubations, CHF, COPD, CAD, MI  6/29 Admitted, Intubated 7/06 Lurline Idol, Cortrak placed  Tolerating trach collar, awaiting placement Pt up to chair today, working with PT, utilizing PMV.   Pt remains NPO, SLP following, noted plan for possible FEES soon. Receiving TF via [place] 1.5 at 60 ml/hr, Pro-Stat 30 mL daily. Noted free water of 50 mL q 8 hours.   Current weight 79.5 kg; admit weight 81.6 kg  Labs: reviewed Meds: Vit C, ss novolog, Vit B-12, Vit D, zinc sulfate  Diet Order:   Diet Order            Diet NPO time specified  Diet effective midnight                 EDUCATION NEEDS:   Not appropriate for education at this time  Skin:  Skin Assessment: Skin Integrity Issues: Skin Integrity Issues:: Stage III Stage III: sacrum  Last BM:  7/8 colostomy  Height:   Ht Readings from Last 1 Encounters:  09/05/19 _0  (1.753 m)    Weight:   Wt Readings from Last 1 Encounters:  09/20/19 79.5 kg    BMI:  Body mass index is 25.88  kg/m.  Estimated Nutritional Needs:   Kcal:  2000-2300 kcals  Protein:  100-120 g  Fluid:  >/= 2 L   Kerman Passey MS, RDN, LDN, CNSC Registered Dietitian III Clinical Nutrition RD Pager and On-Call Pager Number Located in [place]

## 2019-09-22 ENCOUNTER — Inpatient Hospital Stay (HOSPITAL_COMMUNITY): Payer: Medicare HMO

## 2019-09-22 LAB — POCT I-STAT 7, (LYTES, BLD GAS, ICA,H+H)
Acid-Base Excess: 11 mmol/L — ABNORMAL HIGH (ref 0.0–2.0)
Bicarbonate: 39.2 mmol/L — ABNORMAL HIGH (ref 20.0–28.0)
Calcium, Ion: 1.31 mmol/L (ref 1.15–1.40)
HCT: 31 % — ABNORMAL LOW (ref 36.0–46.0)
Hemoglobin: 10.5 g/dL — ABNORMAL LOW (ref 12.0–15.0)
O2 Saturation: 100 %
Potassium: 3.2 mmol/L — ABNORMAL LOW (ref 3.5–5.1)
Sodium: 140 mmol/L (ref 135–145)
TCO2: 41 mmol/L — ABNORMAL HIGH (ref 22–32)
pCO2 arterial: 70.8 mmHg (ref 32.0–48.0)
pH, Arterial: 7.352 (ref 7.350–7.450)
pO2, Arterial: 453 mmHg — ABNORMAL HIGH (ref 83.0–108.0)

## 2019-09-22 LAB — BASIC METABOLIC PANEL
Anion gap: 12 (ref 5–15)
BUN: 18 mg/dL (ref 8–23)
CO2: 32 mmol/L (ref 22–32)
Calcium: 9.8 mg/dL (ref 8.9–10.3)
Chloride: 95 mmol/L — ABNORMAL LOW (ref 98–111)
Creatinine, Ser: 0.74 mg/dL (ref 0.44–1.00)
GFR calc Af Amer: >60 mL/min (ref >60)
GFR calc non Af Amer: >60 mL/min (ref >60)
Glucose, Bld: 203 mg/dL — ABNORMAL HIGH (ref 70–99)
Potassium: 3.6 mmol/L (ref 3.5–5.1)
Sodium: 139 mmol/L (ref 135–145)

## 2019-09-22 LAB — GLUCOSE, CAPILLARY
Glucose-Capillary: 132 mg/dL — ABNORMAL HIGH (ref 70–99)
Glucose-Capillary: 214 mg/dL — ABNORMAL HIGH (ref 70–99)
Glucose-Capillary: 181 mg/dL — ABNORMAL HIGH (ref 70–99)
Glucose-Capillary: 79 mg/dL (ref 70–99)
Glucose-Capillary: 126 mg/dL — ABNORMAL HIGH (ref 70–99)
Glucose-Capillary: 124 mg/dL — ABNORMAL HIGH (ref 70–99)

## 2019-09-22 MED ORDER — POTASSIUM CHLORIDE 20 MEQ/15ML (10%) PO SOLN
40.0000 meq | Freq: Once | ORAL | Status: AC
  Administered 2019-09-22: 40 meq
  Filled 2019-09-22: qty 30

## 2019-09-22 MED ORDER — LORAZEPAM 2 MG/ML IJ SOLN
INTRAMUSCULAR | Status: AC
  Filled 2019-09-22: qty 1

## 2019-09-22 MED ORDER — LORAZEPAM 2 MG/ML IJ SOLN
1.0000 mg | Freq: Once | INTRAMUSCULAR | Status: AC
  Administered 2019-09-22: 1 mg via INTRAVENOUS

## 2019-09-22 MED ORDER — LORAZEPAM 2 MG/ML IJ SOLN
1.0000 mg | INTRAMUSCULAR | Status: DC | PRN
  Administered 2019-09-22: 1 mg via INTRAVENOUS
  Filled 2019-09-22: qty 1

## 2019-09-22 MED ORDER — DEXMEDETOMIDINE HCL IN NACL 400 MCG/100ML IV SOLN
0.4000 ug/kg/h | INTRAVENOUS | Status: DC
  Administered 2019-09-22: 0.4 ug/kg/h via INTRAVENOUS
  Administered 2019-09-22: 1 ug/kg/h via INTRAVENOUS
  Filled 2019-09-22 (×2): qty 100

## 2019-09-22 MED ORDER — FUROSEMIDE 10 MG/ML IJ SOLN
40.0000 mg | Freq: Once | INTRAMUSCULAR | Status: AC
  Administered 2019-09-22: 40 mg via INTRAVENOUS
  Filled 2019-09-22: qty 4

## 2019-09-22 NOTE — Progress Notes (Signed)
SLP Cancellation Note  Patient Details Name: Darling Cieslewicz MRN: [id number] DOB: 1955-11-26   Cancelled treatment:       Reason Eval/Treat Not Completed: Patient not medically ready. Plan for FEES deferred due to events overnight   Agam Tuohy, Katherene Ponto 09/22/2019, 8:18 AM

## 2019-09-22 NOTE — Progress Notes (Signed)
Critical value on ABG reported to Dr Loanne Drilling.  Per MD, wean fio2 and no other vent changes needed currently.  VSS, sat 100% now.   Fio2 weaned to 40% s/p ABG.

## 2019-09-22 NOTE — Progress Notes (Signed)
Physical Therapy Treatment Patient Details Name: Chelsea Allison MRN: [id number] DOB: December 06, 1955 Today's Date: 09/22/2019    History of Present Illness 64 yo female admitted from home 6/29 with respiratory distress and hypoxia.  Intubated in ED. Trach 7/6. pt with respiratory distress and return to vent 7/15. Was in hospital from 6/03 to 6/18 for COPD exacerbation, acute on chronic systolic CHF with NSTEMI requiring intubation who D/Cd to rehab then home 6/22.  PMHx: anal CA s/p colostomy, NSCLC s/p partial resection, CAD, CHF    PT Comments    Pt on vent with pt alert and communicating throughout. Pt expressing confusion over why she is back on vent. Pt able to transition to sitting and transfer to chair but limited by fatigue. Educated for HEP and continued progression.  HR 91-94 PRVC 40% FiO2, SpO2 100% 90/80 (85) EOB, 165/68 (81) in chair    Follow Up Recommendations  SNF;Supervision/Assistance - 24 hour     Equipment Recommendations  None recommended by PT    Recommendations for Other Services       Precautions / Restrictions Precautions Precautions: Fall Precaution Comments: trach, vent, cortrak, ostomy, sacral wounds    Mobility  Bed Mobility Overal bed mobility: Needs Assistance Bed Mobility: Supine to Sit     Supine to sit: Min guard;HOB elevated     General bed mobility comments: HOB 35 degrees with guarding for lines and increased time with pt able to pivot to right side of bed without physical assist  Transfers Overall transfer level: Needs assistance   Transfers: Sit to/from Stand;Stand Pivot Transfers Sit to Stand: Mod assist;+2 safety/equipment Stand pivot transfers: Mod assist;+2 safety/equipment       General transfer comment: mod assist with face to face technique with knees blocked to rise from bed and pivot to recliner with +2 assist to control and guide hips. Pt fatiguing quickly with standing  Ambulation/Gait                 Stairs              Wheelchair Mobility    Modified Rankin (Stroke Patients Only)       Balance Overall balance assessment: Needs assistance   Sitting balance-Leahy Scale: Good Sitting balance - Comments: EOB without UE support   Standing balance support: Bilateral upper extremity supported Standing balance-Leahy Scale: Poor Standing balance comment: bil UE support with knees blocked                            Cognition Arousal/Alertness: Awake/alert Behavior During Therapy: WFL for tasks assessed/performed Overall Cognitive Status: Within Functional Limits for tasks assessed                                 General Comments: pt communicating through writing on paper, gestures and mouthing words      Exercises General Exercises - Lower Extremity Long Arc Quad: AROM;Both;Seated;20 reps Hip Flexion/Marching: AROM;Both;Seated;20 reps    General Comments        Pertinent Vitals/Pain Pain Score: 5  Pain Location: mouth and neck Pain Descriptors / Indicators: Sore;Throbbing;Aching Pain Intervention(s): Limited activity within patient's tolerance    Home Living                      Prior Function            PT Goals (current  goals can now be found in the care plan section) Progress towards PT goals: Progressing toward goals    Frequency    Min 3X/week      PT Plan Current plan remains appropriate    Co-evaluation              AM-PAC PT "6 Clicks" Mobility   Outcome Measure  Help needed turning from your back to your side while in a flat bed without using bedrails?: A Little Help needed moving from lying on your back to sitting on the side of a flat bed without using bedrails?: A Little Help needed moving to and from a bed to a chair (including a wheelchair)?: A Lot Help needed standing up from a chair using your arms (e.g., wheelchair or bedside chair)?: A Lot Help needed to walk in hospital room?: Total Help needed  climbing 3-5 steps with a railing? : Total 6 Click Score: 12    End of Session Equipment Utilized During Treatment: Gait belt Activity Tolerance: Patient tolerated treatment well Patient left: in chair;with call bell/phone within reach;with chair alarm set Nurse Communication: Mobility status;Precautions PT Visit Diagnosis: Muscle weakness (generalized) (M62.81);Unsteadiness on feet (R26.81);Difficulty in walking, not elsewhere classified (R26.2)     Time: 0174-9449 PT Time Calculation (min) (ACUTE ONLY): 35 min  Charges:  $Therapeutic Exercise: 8-22 mins $Therapeutic Activity: 8-22 mins                     Eryck Negron P, PT Acute Rehabilitation Services Pager: [phone] Office: [phone]    Daymon Hora B Kazim Corrales 09/22/2019, 1:13 PM

## 2019-09-22 NOTE — Progress Notes (Signed)
PCCM Progress Note   While in the unit caring for a different patient it was observed that this patient was becoming hypoxic and tachypneic. Per RT and RN patient has struggled with anxiety since liberation from ventilator. RT assisted ventilation with bag mask with resolution of hypoxia. Unfortunately patient again became hypoxic, tachypneic and diaphoretic. At this time patient was again bagged and placed on ventilator. Will start precedex drip for agitation and obtain CXR   Johnsie Cancel, NP-C [facility] Contact / Pager information can be found on Amion  09/22/2019, 3:24 AM

## 2019-09-22 NOTE — Progress Notes (Signed)
Patient up in chair, placed on trach collar 40% at this time. Patient tolerating well with stable vitals. RT will continue to monitor.

## 2019-09-22 NOTE — Progress Notes (Addendum)
[facility] Progress Note Patient Name: Shenia Alan DOB: 1955/11/01 MRN: [id number]   Date of Service  09/22/2019  HPI/Events of Note  Notified by RN of tachypnea.   The patient has acute on chronic hypoxic/hypercapneic respiratory failure now s/p tracheostomy.  She is being weaned from the vent but has intermittent episodes of restlessness and anxiety.    On camera assessment, the patient was tachypneic with RR in the 30s-41.  She is awake with no episode of desaturation. No significant secretions suctioned.  The patient had similar episode the night prior.   eICU Interventions  Patient placed back on the ventilator.  Nighttime dose of klonopin and seroquel given. Ativan prn ordered.       Intervention Category Minor Interventions: Agitation / anxiety - evaluation and management  Elsie Lincoln 09/22/2019, 11:28 PM   1:38 AM Notified by RN that the patient continues to be tachypniec.    CXR done - unchanged.  I/O +250cc Crea stable.  On camera assessment, RR 26-30.  She is sleeping and appears comfortable.  Plan> Give Lasix 20mg  IV once.

## 2019-09-22 NOTE — Progress Notes (Signed)
Pt. Placed on vent per MD  due to pt.desating . Pt. Was suctioned, ambu, multiple attempts to increase pulse ox.

## 2019-09-22 NOTE — Progress Notes (Signed)
NAME:  Chelsea Allison, MRN:  [id number], DOB:  1956-01-14, LOS: 30 ADMISSION DATE:  09/05/2019, CONSULTATION DATE:  09/05/19 REFERRING MD:  Stark Jock- EM, CHIEF COMPLAINT:  Respiratory Distress, AMS  Brief History   64 yo female smoker presented to ED with altered mental status from acute on chronic hypoxic/hypercapnic respiratory failure 2nd to HCAP in setting of COPD.  Past Medical History  NSCLS s/p partial resection Anal cancer s/p colostomy CAD with MI s/p PCI COPD on home O2 HFrEF Tobacco abuse  Acute on chronic respiratory failure requiring intubation, repeated   Significant Hospital Events   6/29 admitted to [facility]  7/1 hgb drop.  7/2 SBTx4h 7/4 only tolerated 78min on pressure support. Starting precedex/klonapin. Obtaining hemolytic labs. 7/6 trached 7/9: Copious secretions still noted.  Tachypneic.  Mildly hypotensive.  Presuming anxiety complicating weaning efforts titration of anxiolytic regimen performed, starting aerosol trach collar trials 7/10: Was on trach collar, pulse oximetry dropping to 40s supplemental oxygen increase but still on the needed achieve 70% FiO2 placed back on ventilator lavaged for pink-tan secretions saturations improved back on vent, checking sputum culture and chest x-ray. 7/11 looks better. Working on weaning   Procedures:  ETT 6/29-7/6 Tracheostomy 7/6  Significant Diagnostic Tests:  CXR 7/2: appropriate positioning of ETT, NG tubes, no acute overlying lung processes noted  CXR 7/6: new patchy left lung base opacity which could represent atelectasis vs aspiration vs pna, tracheostomy in place  Micro Data:  6/29 SARS Cov2>negative 6/29 tracheal aspirate>  6/29 UCx> less than 10,000 colonies insignificant growth 6/29 BCx> NG 6/29 MRSA PCR>negative 7/10: Sputum culture: E. coli, Klebsiella  Antimicrobials:  6/29 vancomycin>6/30 6/29 pip/tazo> 7/3 Ancef 7/12 >>   Interim history/subjective:  7/16: On trach collar trial this am.  7/15:  awaiting placement for ltach, tolerated atc >48hr now. Longer pmv trials as well. tmax 98.7. more alert today, continuing to decrease sedating meds.  7/14: tmax 100. Drowsy this am on atc.  7/13: tolerating short periods of PMV. Cont to c/o anxiety and chest tightness but falling asleep as I am attempting to discuss this further with her.   Objective   Blood pressure (!) 93/48, pulse 78, temperature 99.1 F (37.3 C), temperature source Oral, resp. rate (!) 31, height 5\' 9"  (1.753 m), weight 79.5 kg, SpO2 100 %.    Vent Mode: PRVC FiO2 (%):  [35 %-100 %] 40 % Set Rate:  [18 bmp] 18 bmp Vt Set:  [400 mL] 400 mL PEEP:  [5 cmH20] 5 cmH20 Plateau Pressure:  [19 cmH20] 19 cmH20   Intake/Output Summary (Last 24 hours) at 09/22/2019 0838 Last data filed at 09/22/2019 0800 Gross per 24 hour  Intake 1850.18 ml  Output 2680 ml  Net -829.82 ml   Filed Weights   09/17/19 0403 09/19/19 0600 09/20/19 0500  Weight: 81.1 kg 79.9 kg 79.5 kg    Examination:  General: Chronically ill appearing, NAD HE: Normocephalic, atraumatic , EOMI, Conjunctivae normal ENT: No congestion, no rhinorrhea, Tracheostomy in place Cardiovascular: Normal rate, regular rhythm.  No murmurs, rubs, or gallops Pulmonary : Course breath sounds bilaterally  Abdominal: soft, nontender,  bowel sounds present Musculoskeletal: no swelling , deformity, injury ,or tenderness in extremities, Skin: Warm, dry , no bruising, erythema, or rash Psychiatric/Behavioral:  normal mood, normal behavior    Na 139, Cr 0.74 ABG: 7.35/70/39.2/453 on Vent FIO2 40 , PEEP 5 , RR 18  Assessment & Plan:  Chelsea Allison is a 29 yp female including but  not limited to CAD/STEMI HTN, HLD, October 2020 s/p PCI, COPD,  HFrEF, non small cell lung cancer stage III s/p partial lung resection, anal cancer s/p colostomy admitted on 6/29 for acute on chronic hypoxic/hypercapnic respiratory failure from HCAP.   Acute on chronic hypercapnic hypoxic respiratory  failure requiring mechanical ventilation 2/2 HCAP, COPD exacerbation complicated by severe anxiety S/p tracheostomy 7/6 for inability to wean off vent and repeated intubations.  -Anxiety, underlying lung mechanics, and copious pulmonary secretions currently barrier to progress.  Plan - trach trial  Cont Ancef (7 day total), stop date 7/18 ,  for pansensitive E. coli and Klebsiella pneumonia Continue bronchodilators, Mucinex Out of bed, mobilize PT consult /OT consult -approaching LTACH's for her if possible with CM   Chronic cancer related pain and anxiety.  -her regimen is being adjusted.  Plan Current regimen: Seroquel 25 BID, buprenorphine patch, Klonopin BID, Oxy 10 mg q6 Medications reduced, but she is on a lot of sedating meds and continues to be very somnolent on exam.Following commands.  - Continue current regimen as above. Difficult to find happy median.   Chronic HFrEF w/ h/o CAD  EF 35-40% on 08/12/2019 - Net -864 yesterday with 2.4L urine output, no pulmonary edema or effusions on chest xray this am Plan Continue telemetry  Continue aspirin Brilinta and Daily assessment for diuresis   Iron deficient Anemia.  Stable and Hgb trending up. No obvious source of bleeding. Plan Trend CBC intermittently and transfuse for hemoglobin less than 7  DM type 2 Blood sugars stable Morning CBG 203, no changes.  Plan Sliding scale insulin  Hx NSCLC s/p partial resection XLT. Hx anal cancer s/p colectomy. Plan Outpatient follow-up at some point  High risk malnutrition.  Plan Tube feeds   Best practice:  Diet: tube feeds DVT prophylaxis: lovenox GI prophylaxis: protonix Mobility: PT Code Status: Full  Disposition:  Transfer from ICU. TRH to assume care, ccm will follow peripherally for trach maintenance Family update: updated patient

## 2019-09-22 NOTE — Progress Notes (Signed)
Pt. Switched back over to previous vent settings due to tachycardia/tachypneic. RT will continue to monitor.

## 2019-09-23 ENCOUNTER — Inpatient Hospital Stay (HOSPITAL_COMMUNITY): Payer: Medicare HMO

## 2019-09-23 DIAGNOSIS — L89303 Pressure ulcer of unspecified buttock, stage 3: Secondary | ICD-10-CM

## 2019-09-23 LAB — BASIC METABOLIC PANEL
Anion gap: 12 (ref 5–15)
BUN: 30 mg/dL — ABNORMAL HIGH (ref 8–23)
CO2: 30 mmol/L (ref 22–32)
Calcium: 9.1 mg/dL (ref 8.9–10.3)
Chloride: 95 mmol/L — ABNORMAL LOW (ref 98–111)
Creatinine, Ser: 0.85 mg/dL (ref 0.44–1.00)
GFR calc Af Amer: >60 mL/min (ref >60)
GFR calc non Af Amer: >60 mL/min (ref >60)
Glucose, Bld: 191 mg/dL — ABNORMAL HIGH (ref 70–99)
Potassium: 3.5 mmol/L (ref 3.5–5.1)
Sodium: 137 mmol/L (ref 135–145)

## 2019-09-23 LAB — GLUCOSE, CAPILLARY
Glucose-Capillary: 102 mg/dL — ABNORMAL HIGH (ref 70–99)
Glucose-Capillary: 127 mg/dL — ABNORMAL HIGH (ref 70–99)
Glucose-Capillary: 130 mg/dL — ABNORMAL HIGH (ref 70–99)
Glucose-Capillary: 138 mg/dL — ABNORMAL HIGH (ref 70–99)
Glucose-Capillary: 154 mg/dL — ABNORMAL HIGH (ref 70–99)
Glucose-Capillary: 186 mg/dL — ABNORMAL HIGH (ref 70–99)
Glucose-Capillary: 216 mg/dL — ABNORMAL HIGH (ref 70–99)

## 2019-09-23 LAB — POCT I-STAT 7, (LYTES, BLD GAS, ICA,H+H)
Acid-Base Excess: 11 mmol/L — ABNORMAL HIGH (ref 0.0–2.0)
Bicarbonate: 36.9 mmol/L — ABNORMAL HIGH (ref 20.0–28.0)
Calcium, Ion: 1.26 mmol/L (ref 1.15–1.40)
HCT: 32 % — ABNORMAL LOW (ref 36.0–46.0)
Hemoglobin: 10.9 g/dL — ABNORMAL LOW (ref 12.0–15.0)
O2 Saturation: 96 %
Patient temperature: 98.6
Potassium: 3.4 mmol/L — ABNORMAL LOW (ref 3.5–5.1)
Sodium: 139 mmol/L (ref 135–145)
TCO2: 39 mmol/L — ABNORMAL HIGH (ref 22–32)
pCO2 arterial: 55.5 mmHg — ABNORMAL HIGH (ref 32.0–48.0)
pH, Arterial: 7.43 (ref 7.350–7.450)
pO2, Arterial: 84 mmHg (ref 83.0–108.0)

## 2019-09-23 LAB — CBC
HCT: 33.1 % — ABNORMAL LOW (ref 36.0–46.0)
Hemoglobin: 9.5 g/dL — ABNORMAL LOW (ref 12.0–15.0)
MCH: 26.5 pg (ref 26.0–34.0)
MCHC: 28.7 g/dL — ABNORMAL LOW (ref 30.0–36.0)
MCV: 92.5 fL (ref 80.0–100.0)
Platelets: 396 10*3/uL (ref 150–400)
RBC: 3.58 MIL/uL — ABNORMAL LOW (ref 3.87–5.11)
RDW: 18.5 % — ABNORMAL HIGH (ref 11.5–15.5)
WBC: 12.6 10*3/uL — ABNORMAL HIGH (ref 4.0–10.5)
nRBC: 0 % (ref 0.0–0.2)

## 2019-09-23 LAB — PHOSPHORUS: Phosphorus: 4.4 mg/dL (ref 2.5–4.6)

## 2019-09-23 LAB — MAGNESIUM: Magnesium: 2.2 mg/dL (ref 1.7–2.4)

## 2019-09-23 MED ORDER — FUROSEMIDE 10 MG/ML IJ SOLN
20.0000 mg | Freq: Once | INTRAMUSCULAR | Status: AC
  Administered 2019-09-23: 20 mg via INTRAVENOUS
  Filled 2019-09-23: qty 2

## 2019-09-23 MED ORDER — MENTHOL 3 MG MT LOZG
1.0000 | LOZENGE | OROMUCOSAL | Status: DC | PRN
  Filled 2019-09-23: qty 9

## 2019-09-23 MED ORDER — POTASSIUM CHLORIDE 20 MEQ/15ML (10%) PO SOLN
40.0000 meq | Freq: Once | ORAL | Status: AC
  Administered 2019-09-23: 40 meq
  Filled 2019-09-23: qty 30

## 2019-09-23 NOTE — Progress Notes (Addendum)
PROGRESS NOTE    Chelsea Allison  GBT:[id number] DOB: October 02, 1955 DOA: 09/05/2019 PCP: Beckie Salts, MD    Brief Narrative:  Patient admitted to the hospital with a working diagnosis of acute on chronic hypoxic hypercapnic respiratory failure due to pneumonia, and COPD exacerbation (present on admission).  Now status post tracheostomy.  64 year old female who presented with dyspnea.  She does have significant past medical history for coronary artery disease status post PCI October 2020, COPD, dyslipidemia, hypertension, heart failure, non-small cell lung cancer stage III status post partial lung resection, and anal cancer status post colectomy.  She had recent hospitalization June 6 to 18, 2021 where she required invasive mechanical ventilation for respiratory distress, due to COPD exacerbation.  Patient developed respiratory distress and hypoxia at home, brought to the hospital by EMS.  Apparently patient was found by a friend obtunded, with a respiratory stress.  Patient received supplemental oxygen, at the arrival to the emergency department she was unable to protect her airway and she was intubated and placed on mechanical ventilation.  Her blood pressure was 76/63, heart rate 128, temperature 98.1, respiratory 21 on mechanical ventilation oxygen saturation 93%.  Her lungs had diffuse rhonchi bilaterally with scattered rales, heart S1-S2, present, rhythmic, abdomen soft, obese, no lower extremity edema. ABG pH 7.21, PCO2 78.7, PO2 415, bicarb 34. Her chest x-ray had bilateral interstitial markings, predominantly hilar regions/ right upper.  Scarring right base. EKG 124 bpm, normal axis, normal intervals, sinus rhythm, PACs, with poor R wave progression, no ST segment or T wave changes, low voltage.  Patient was admitted to intensive care unit, continue invasive mechanical ventilation, she was placed on antibiotic therapy and aggressive bronchodilators.  Patient failed spontaneous breathing  trials with pressure support, on July 6 patient underwent tracheostomy.  Patient suffered from anxiety that delayed her liberation from mechanical ventilation.  She had copious secretions.  She was diagnosed with tracheitis/ pneumonia due to Klebsiella and E. coli.  Patient was placed on dexmedetomidine with good toleration, eventually transition to trach collar.  Transferred to [facility] July 17.  Assessment & Plan:   Active Problems:   COPD exacerbation (HCC)   Respiratory failure (HCC)   Acute on chronic respiratory failure with hypoxia and hypercapnia (HCC)   Pressure injury of skin   Status post tracheostomy ([place])   1. Acute on chronic hypoxic and hypercapnic respiratory failure due to COPD exacerbation and right upper lobe pneumonia (present on admission). Sp tracheostomy 07/06. Cuffed trach. Patient using a one way valve for speaking. Complains of sore throat. Continue to have NG tube for feedings. Her oxygenation is 100% on trach collar 8 L/ min of supplemental 02. Wbc is 12.6 up from 9.6.   Continue bronchodilator therapy, oxymetry monitoring and antibiotic therapy with Cefazolin, to complete on 07/18.   Follow with PT/OT and speech recommendations.   At night patient gets agitated with severe respiratory distress, requiring invasive mechanical ventilation. She was on the ventilator last night. Will keep in the ICU until she can stay off mechanical ventilation for at least 24 H.   2. Metabolic encephalopathy/ chronic pain syndrome. Patient is more awake and responsive, she is following commands and speaking with one way valve.   Continue with clonazepam bid, quetiapine bid and as needed lorazepam. Continue neuro checks and aspiration precautions. Nutritional support with tube feedings.   Continue analgesia with buprenorphine and oxycodone.   3. Acute on chronic systolic heart failure exacerbation. Echocardiogram with LV systolic function 35 to 71%. Urine  out put over last 24 H is  2,200 ml  Continue diuresis and blood pressure monitoring.   4. Iron deficiency anemia, non small cell lung cancer, anal cancer. Stable Hgb at 10.9 and hct at 32.0. Plt at 396.   5,. T2DM fasting glucose is 191 this am, continue insulin sliding scale for glucose cover and monitoring.   6. Calorie protein malnutrition/ moderate. Sage 3 sacrum pressure ulcer, left hip pressure ulcer (not able to stage) Continue with nutritional supplements. Local wound care.     Patient continue to be at high risk for worsening respiratory failure   Status is: Inpatient  Remains inpatient appropriate because:Inpatient level of care appropriate due to severity of illness   Dispo: The patient is from: Home              Anticipated d/c is to: SNF              Anticipated d/c date is: 3 days              Patient currently is not medically stable to d/c.    DVT prophylaxis: Enoxaparin  Code Status:   full  Family Communication:  No family at the bedside      Nutrition Status: Nutrition Problem: Inadequate oral intake Etiology: acute illness Signs/Symptoms: NPO status Interventions: Tube feeding     Skin Documentation: Pressure Injury 09/07/19 Sacrum Unstageable - Full thickness tissue loss in which the base of the injury is covered by slough (yellow, tan, gray, green or brown) and/or eschar (tan, brown or black) in the wound bed. (Active)  09/07/19 1000  Location: Sacrum  Location Orientation:   Staging: Unstageable - Full thickness tissue loss in which the base of the injury is covered by slough (yellow, tan, gray, green or brown) and/or eschar (tan, brown or black) in the wound bed.  Wound Description (Comments):   Present on Admission: Yes     Pressure Injury 09/10/19 Sacrum Lower Stage 3 -  Full thickness tissue loss. Subcutaneous fat may be visible but bone, tendon or muscle are NOT exposed. 1x1x1 (Active)  09/10/19 1355  Location: Sacrum  Location Orientation: Lower  Staging: Stage  3 -  Full thickness tissue loss. Subcutaneous fat may be visible but bone, tendon or muscle are NOT exposed.  Wound Description (Comments): 1x1x1  Present on Admission:      Pressure Injury 09/10/19 Lip Left skin breakdown from ETT (Active)  09/10/19 0700  Location: Lip  Location Orientation: Left  Staging:   Wound Description (Comments): skin breakdown from ETT  Present on Admission:      Procedures:   Tracheostomy   Antimicrobials:   Cefazolin     Subjective: Patient continue to be very weak and deconditioned, positive sore throat, tolerating tube feedings, no agitation today.   Objective: Vitals:   09/23/19 0800 09/23/19 0900 09/23/19 0915 09/23/19 1000  BP: 109/67 (!) 88/53 (!) 106/55 (!) 94/56  Pulse: 100 98 96 90  Resp: (!) 28 (!) 25 (!) 25 (!) 24  Temp: 98.4 F (36.9 C)     TempSrc: Oral     SpO2: 100% 99% 98% 100%  Weight:      Height:        Intake/Output Summary (Last 24 hours) at 09/23/2019 1034 Last data filed at 09/23/2019 1000 Gross per 24 hour  Intake 874.28 ml  Output 2750 ml  Net -1875.72 ml   Filed Weights   09/17/19 0403 09/19/19 0600 09/20/19 0500  Weight: 81.1  kg 79.9 kg 79.5 kg    Examination:   General:deconditioned and ill looking appearing  Neurology: somnolent but easy to arouse, following commands.,  E ENT: mild pallor, no icterus, oral mucosa moist. Trach in place.  Cardiovascular: No JVD. S1-S2 present, rhythmic, no gallops, rubs, or murmurs. No lower extremity edema. Pulmonary: positive breath sounds bilaterally, adequate air movement, no wheezing, rhonchi or rales. Gastrointestinal. Abdomen soft, protuberant with no organomegaly, non tender, no rebound or guarding Skin. No rashes Musculoskeletal: no joint deformities     Data Reviewed: I have personally reviewed following labs and imaging studies  CBC: Recent Labs  Lab 09/17/19 0412 09/17/19 0412 09/18/19 0221 09/19/19 0402 09/22/19 0408 09/23/19 0251  09/23/19 0504  WBC 8.4  --  9.0 9.6  --  12.6*  --   HGB 8.5*   < > 9.3* 9.8* 10.5* 9.5* 10.9*  HCT 30.1*   < > 33.3* 34.0* 31.0* 33.1* 32.0*  MCV 92.6  --  92.0 90.2  --  92.5  --   PLT 623*  --  590* 537*  --  396  --    < > = values in this interval not displayed.   Basic Metabolic Panel: Recent Labs  Lab 09/17/19 0412 09/18/19 0221 09/19/19 0402 09/19/19 0402 09/20/19 1538 09/20/19 1538 09/21/19 0413 09/22/19 0322 09/22/19 0408 09/23/19 0251 09/23/19 0504  NA 141   < > 134*   < > 139   < > 138 139 140 137 139  K 3.5   < > 2.9*   < > 3.5   < > 3.9 3.6 3.2* 3.5 3.4*  CL 97*   < > 89*  --  95*  --  94* 95*  --  95*  --   CO2 32   < > 32  --  33*  --  34* 32  --  30  --   GLUCOSE 148*   < > 200*  --  114*  --  156* 203*  --  191*  --   BUN 27*   < > 25*  --  22  --  20 18  --  30*  --   CREATININE 0.95   < > 0.89  --  0.68  --  0.72 0.74  --  0.85  --   CALCIUM 9.4   < > 9.6  --  9.4  --  9.3 9.8  --  9.1  --   MG 2.2  --  2.3  --   --   --   --   --   --  2.2  --   PHOS  --   --  4.1  --   --   --   --   --   --  4.4  --    < > = values in this interval not displayed.   GFR: Estimated Creatinine Clearance: 76.5 mL/min (by C-G formula based on SCr of 0.85 mg/dL). Liver Function Tests: No results for input(s): AST, ALT, ALKPHOS, BILITOT, PROT, ALBUMIN in the last 168 hours. No results for input(s): LIPASE, AMYLASE in the last 168 hours. No results for input(s): AMMONIA in the last 168 hours. Coagulation Profile: No results for input(s): INR, PROTIME in the last 168 hours. Cardiac Enzymes: No results for input(s): CKTOTAL, CKMB, CKMBINDEX, TROPONINI in the last 168 hours. BNP (last 3 results) No results for input(s): PROBNP in the last 8760 hours. HbA1C: No results for input(s): HGBA1C in the last  72 hours. CBG: Recent Labs  Lab 09/22/19 1625 09/22/19 1953 09/23/19 0014 09/23/19 0350 09/23/19 0816  GLUCAP 126* 124* 216* 186* 154*   Lipid Profile: No results  for input(s): CHOL, HDL, LDLCALC, TRIG, CHOLHDL, LDLDIRECT in the last 72 hours. Thyroid Function Tests: No results for input(s): TSH, T4TOTAL, FREET4, T3FREE, THYROIDAB in the last 72 hours. Anemia Panel: No results for input(s): VITAMINB12, FOLATE, FERRITIN, TIBC, IRON, RETICCTPCT in the last 72 hours.    Radiology Studies: I have reviewed all of the imaging during this hospital visit personally     Scheduled Meds: . vitamin C  250 mg Per Tube BID  . aspirin  81 mg Per Tube Daily  . buprenorphine  1 patch Transdermal Weekly   And  . buprenorphine  1 patch Transdermal Weekly  . chlorhexidine gluconate (MEDLINE KIT)  15 mL Mouth Rinse BID  . Chlorhexidine Gluconate Cloth  6 each Topical Daily  . clonazePAM  1 mg Per Tube BID  . collagenase   Topical Daily  . docusate  100 mg Per Tube BID  . enoxaparin (LOVENOX) injection  40 mg Subcutaneous Q24H  . feeding supplement (PROSource TF)  45 mL Per Tube TID  . free water  50 mL Per Tube Q8H  . guaiFENesin  15 mL Per Tube Q6H  . insulin aspart  0-15 Units Subcutaneous Q4H  . ipratropium-albuterol  3 mL Nebulization Q6H  . mouth rinse  15 mL Mouth Rinse 10 times per day  . oxyCODONE  10 mg Per Tube Q6H  . pantoprazole sodium  40 mg Per Tube Daily  . polyethylene glycol  17 g Per Tube Daily  . QUEtiapine  25 mg Per Tube BID  . rosuvastatin  20 mg Per Tube Daily  . ticagrelor  90 mg Per Tube BID  . vitamin B-12  1,000 mcg Per Tube QODAY  . Vitamin D (Ergocalciferol)  50,000 Units Per Tube Q7 days  . zinc sulfate  220 mg Per Tube Daily   Continuous Infusions: . sodium chloride 10 mL/hr at 09/23/19 1000  .  ceFAZolin (ANCEF) IV 2 g (09/23/19 0239)  . feeding supplement (VITAL 1.5 CAL) 60 mL/hr at 09/23/19 1000     LOS: 18 days        Chelsea Allison Gerome Apley, MD

## 2019-09-23 NOTE — Progress Notes (Signed)
[facility] ADULT ICU REPLACEMENT PROTOCOL   The patient does apply for the [facility] Adult ICU Electrolyte Replacment Protocol based on the criteria listed below:   1. Is GFR >/= 30 ml/min? Yes.    Patient's GFR today is >60 2. Is SCr </= 2? Yes.   Patient's SCr is 0.85 ml/kg/hr 3. Did SCr increase >/= 0.5 in 24 hours? no 4. Abnormal electrolyte(s): K-3.5 5. Ordered repletion with: per protocol 6. If a panic level lab has been reported, has the CCM MD in charge been notified? Yes.     Physician:  Dr Welton Flakes, Philis Nettle 09/23/2019 6:42 AM

## 2019-09-23 NOTE — Plan of Care (Signed)

## 2019-09-23 NOTE — Progress Notes (Signed)
NAME:  Chelsea Allison, MRN:  [id number], DOB:  Jul 11, 1955, LOS: 5 ADMISSION DATE:  09/05/2019, CONSULTATION DATE:  09/05/2019 REFERRING MD:  Stark Jock, CHIEF COMPLAINT:  Respiratory distress, AMS   Brief History   64 yo female smoker presented to ED with altered mental status from acute on chronic hypoxic/hypercapnic respiratory failure 2nd to HCAP in setting of COPD.  Past Medical History  NSCLS s/p partial resection Anal cancer s/p colostomy CAD with MI s/p PCI COPD on home O2 HFrEF Tobacco abuse  Acute on chronic respiratory failure requiring intubation, repeated   Significant Hospital Events   6/29 admitted to [facility]  7/1 hgb drop.  7/2 SBTx4h 7/4 only tolerated 62min on pressure support. Starting precedex/klonapin. Obtaining hemolytic labs. 7/6 trached 7/9: Copious secretions still noted.  Tachypneic.  Mildly hypotensive.  Presuming anxiety complicating weaning efforts titration of anxiolytic regimen performed, starting aerosol trach collar trials 7/10: Was on trach collar, pulse oximetry dropping to 40s supplemental oxygen increase but still on the needed achieve 70% FiO2 placed back on ventilator lavaged for pink-tan secretions saturations improved back on vent, checking sputum culture and chest x-ray. 7/16: Has intermittent episodes of restlessness and anxiety. At nightwas tachypneic with RR in the 30s-41 and placed back on vent     Procedures:  ETT 6/29-7/6 Tracheostomy 7/6  Significant Diagnostic Tests:  7/17 PCXR shows persistent mixed interstitial and airspace disease of the left lung.   Micro Data:  6/29 SARS Cov2>negative 6/29 tracheal aspirate>  6/29 UCx> less than 10,000 colonies insignificant growth 6/29 BCx> NG 6/29 MRSA PCR>negative 7/10: Sputum culture: E. coli, Klebsiella  Antimicrobials:  6/29 vancomycin>6/30 6/29 pip/tazo> 7/3 Ancef 7/12 >>    Interim history/subjective:  No c/o SOB, rather indicates pain in her neck area and desires the frequency of her  pain meds to be increased  Objective   Blood pressure 111/63, pulse (!) 105, temperature 98.5 F (36.9 C), temperature source Oral, resp. rate (!) 28, height 5\' 9"  (1.753 m), weight 79.5 kg, SpO2 97 %.    Vent Mode: Stand-by FiO2 (%):  [35 %-40 %] 35 % Set Rate:  [15 bmp-18 bmp] 18 bmp Vt Set:  [400 mL] 400 mL PEEP:  [5 cmH20] 5 cmH20   Intake/Output Summary (Last 24 hours) at 09/23/2019 1640 Last data filed at 09/23/2019 1432 Gross per 24 hour  Intake 777.32 ml  Output 2100 ml  Net -1322.68 ml   Filed Weights   09/17/19 0403 09/19/19 0600 09/20/19 0500  Weight: 81.1 kg 79.9 kg 79.5 kg    Examination: General: NARD HENT: [place]/AT EOMI; no erythema in neck area. Poor dentition with missing teeth in lower jaw Lungs: Rhonchi transmitted from trach secretions Cardiovascular: RRR, no m/r/g Abdomen: Supple, non tender Extremities: No c/c/e  Assessment & Plan:  1) Acute on chronic hypercapnic hypoxic respiratory failure requiring mechanical ventilation due to HCAP, COPD exacerbation complicated by severe anxiety S/p tracheostomy 7/6 for inability to wean off vent and repeated intubations.  Cont Ancef (7 day total), stop date 7/18 ,  for pansensitive E. coli and Klebsiella pneumonia Continue bronchodilators, Mucinex Out of bed, mobilize PT consult /OT consult As noted yesterday, PCCM will periodically follow for trach wean 2) Chronic cancer related pain and anxiety Current regimen: Seroquel 25 BID, buprenorphine patch, Klonopin BID, Oxy 10 mg q6 Other plans per Hospitalist   Best practice:  Diet: TF Pain/Anxiety/Delirium protocol (if indicated): prn VAP protocol (if indicated): n/a DVT prophylaxis: Lovenox GI prophylaxis: Protonix Mobility: PT Code Status:  Full Family Communication: Updated patient Disposition: May tx out of ICU if patient does well overnight  Labs   CBC: Recent Labs  Lab 09/17/19 0412 09/17/19 0412 09/18/19 0221 09/19/19 0402 09/22/19 0408  09/23/19 0251 09/23/19 0504  WBC 8.4  --  9.0 9.6  --  12.6*  --   HGB 8.5*   < > 9.3* 9.8* 10.5* 9.5* 10.9*  HCT 30.1*   < > 33.3* 34.0* 31.0* 33.1* 32.0*  MCV 92.6  --  92.0 90.2  --  92.5  --   PLT 623*  --  590* 537*  --  396  --    < > = values in this interval not displayed.    Basic Metabolic Panel: Recent Labs  Lab 09/17/19 0412 09/18/19 0221 09/19/19 0402 09/19/19 0402 09/20/19 1538 09/20/19 1538 09/21/19 0413 09/22/19 0322 09/22/19 0408 09/23/19 0251 09/23/19 0504  NA 141   < > 134*   < > 139   < > 138 139 140 137 139  K 3.5   < > 2.9*   < > 3.5   < > 3.9 3.6 3.2* 3.5 3.4*  CL 97*   < > 89*  --  95*  --  94* 95*  --  95*  --   CO2 32   < > 32  --  33*  --  34* 32  --  30  --   GLUCOSE 148*   < > 200*  --  114*  --  156* 203*  --  191*  --   BUN 27*   < > 25*  --  22  --  20 18  --  30*  --   CREATININE 0.95   < > 0.89  --  0.68  --  0.72 0.74  --  0.85  --   CALCIUM 9.4   < > 9.6  --  9.4  --  9.3 9.8  --  9.1  --   MG 2.2  --  2.3  --   --   --   --   --   --  2.2  --   PHOS  --   --  4.1  --   --   --   --   --   --  4.4  --    < > = values in this interval not displayed.   GFR: Estimated Creatinine Clearance: 76.5 mL/min (by C-G formula based on SCr of 0.85 mg/dL). Recent Labs  Lab 09/17/19 0412 09/18/19 0221 09/19/19 0402 09/23/19 0251  WBC 8.4 9.0 9.6 12.6*    Liver Function Tests: No results for input(s): AST, ALT, ALKPHOS, BILITOT, PROT, ALBUMIN in the last 168 hours. No results for input(s): LIPASE, AMYLASE in the last 168 hours. No results for input(s): AMMONIA in the last 168 hours.  ABG    Component Value Date/Time   PHART 7.430 09/23/2019 0504   PCO2ART 55.5 (H) 09/23/2019 0504   PO2ART 84 09/23/2019 0504   HCO3 36.9 (H) 09/23/2019 0504   TCO2 39 (H) 09/23/2019 0504   ACIDBASEDEF 1.0 08/15/2019 1944   O2SAT 96.0 09/23/2019 0504     Coagulation Profile: No results for input(s): INR, PROTIME in the last 168 hours.  Cardiac  Enzymes: No results for input(s): CKTOTAL, CKMB, CKMBINDEX, TROPONINI in the last 168 hours.  HbA1C: Hgb A1c MFr Bld  Date/Time Value Ref Range Status  08/11/2019 08:00 AM 5.8 (H) 4.8 - 5.6 % Final  Comment:    (NOTE) Pre diabetes:          5.7%-6.4% Diabetes:              >6.4% Glycemic control for   <7.0% adults with diabetes   01/10/2019 04:12 AM 5.7 (H) 4.8 - 5.6 % Final    Comment:    (NOTE) Pre diabetes:          5.7%-6.4% Diabetes:              >6.4% Glycemic control for   <7.0% adults with diabetes     CBG: Recent Labs  Lab 09/23/19 0014 09/23/19 0350 09/23/19 0816 09/23/19 1221 09/23/19 1616  GLUCAP 216* 186* 154* 102* 127*     Past Medical History  She,  has a past medical history of CAD (coronary artery disease) 10/20 DES to pRCA, DES mCx, Cancer of lung ([place]), Colon cancer ([place]), COPD (chronic obstructive pulmonary disease) ([place]), Hyperlipidemia, Hypertension, Ischemic cardiomyopathy, Neuropathy, STEMI (ST elevation myocardial infarction) ([place]) (12/12/2018), and Tobacco abuse.   Surgical History    Past Surgical History:  Procedure Laterality Date  . ABDOMINAL SURGERY     For colon cancer  . COLOSTOMY    . CORONARY STENT INTERVENTION N/A 12/20/2018   Procedure: CORONARY STENT INTERVENTION - Right;  Surgeon: Burnell Blanks, MD;  Location: [place] [facility];  Service: Cardiovascular;  Laterality: N/A;  . CORONARY/GRAFT ACUTE MI REVASCULARIZATION N/A 12/12/2018   Procedure: Coronary/Graft Acute MI Revascularization;  Surgeon: Belva Crome, MD;  Location: [place] [facility];  Service: Cardiovascular;  Laterality: N/A;  . LEFT HEART CATH AND CORONARY ANGIOGRAPHY N/A 12/12/2018   Procedure: LEFT HEART CATH AND CORONARY ANGIOGRAPHY;  Surgeon: Belva Crome, MD;  Location: [facility];  Service: Cardiovascular;  Laterality: N/A;  . LEFT HEART CATH AND CORONARY ANGIOGRAPHY N/A 08/11/2019   Procedure: LEFT HEART CATH AND CORONARY ANGIOGRAPHY;   Surgeon: Burnell Blanks, MD;  Location: [place];  Service: Cardiovascular;  Laterality: N/A;     Social History   reports that she has been smoking cigarettes. She has never used smokeless tobacco. She reports that she does not drink alcohol and does not use drugs.   Family History   Her family history includes Hypertension in her father and mother.   Allergies Allergies  Allergen Reactions  . Statins Other (See Comments)  . Hydrochlorothiazide Other (See Comments)    headaches  . Hydrocodone Nausea And Vomiting  . Lisinopril Other (See Comments)    Headache   . Oxycodone Nausea Only     Home Medications  Prior to Admission medications   Medication Sig Start Date End Date Taking? Authorizing Provider  acetaminophen (TYLENOL) 500 MG tablet Take 2 tablets (1,000 mg total) by mouth every 8 (eight) hours as needed for mild pain. 08/29/19   Reed, Tiffany L, DO  albuterol (VENTOLIN HFA) 108 (90 Base) MCG/ACT inhaler Inhale 2 puffs into the lungs every 6 (six) hours as needed for wheezing or shortness of breath. 08/29/19   Reed, Tiffany L, DO  ALPRAZolam Duanne Moron) 0.5 MG tablet Take 0.5 mg by mouth 2 (two) times daily as needed for anxiety.    [provider]  aspirin 81 MG chewable tablet Chew 1 tablet (81 mg total) by mouth daily. 08/29/19   Reed, Tiffany L, DO  BREO ELLIPTA 100-25 MCG/INH AEPB Inhale 1 puff into the lungs daily. 08/29/19   Reed, Tiffany L, DO  buprenorphine (BUTRANS) 20  MCG/HR PTWK Place 1 patch onto the skin once a week. 08/30/19   [provider]  Cyanocobalamin (B-12) 1000 MCG CAPS Take 1 capsule by mouth every other day. 08/29/19   Reed, Tiffany L, DO  docusate sodium (COLACE) 100 MG capsule Take 1 capsule (100 mg total) by mouth 2 (two) times daily. 08/29/19   Reed, Tiffany L, DO  feeding supplement, ENSURE ENLIVE, (ENSURE ENLIVE) LIQD Take 237 mLs by mouth 3 (three) times daily between meals. 08/25/19   Terrilee Croak, MD  furosemide  (LASIX) 20 MG tablet Take 1 tablet (20 mg total) by mouth daily. 08/29/19   Reed, Tiffany L, DO  guaiFENesin (MUCINEX) 600 MG 12 hr tablet Take 1 tablet (600 mg total) by mouth 2 (two) times daily. 08/29/19   Reed, Tiffany L, DO  ipratropium-albuterol (DUONEB) 0.5-2.5 (3) MG/3ML SOLN Inhale 3 mLs into the lungs every 6 (six) hours as needed. 08/29/19   Reed, Tiffany L, DO  losartan (COZAAR) 25 MG tablet Take 0.5 tablets (12.5 mg total) by mouth daily. 08/29/19   Reed, Tiffany L, DO  melatonin 3 MG TABS tablet Take 1 tablet (3 mg total) by mouth at bedtime as needed (insomnia/anxiety). 08/29/19   Reed, Tiffany L, DO  metoprolol succinate (TOPROL-XL) 25 MG 24 hr tablet Take 1 tablet (25 mg total) by mouth daily. 08/29/19   Reed, Tiffany L, DO  Multiple Vitamin (MULTIVITAMIN WITH MINERALS) TABS tablet Take 1 tablet by mouth daily. 08/29/19   Reed, Tiffany L, DO  pantoprazole (PROTONIX) 40 MG tablet Take 1 tablet (40 mg total) by mouth daily. 08/29/19   Reed, Tiffany L, DO  polyethylene glycol (MIRALAX / GLYCOLAX) 17 g packet Take 17 g by mouth daily as needed for moderate constipation. 08/29/19   Reed, Tiffany L, DO  potassium chloride SA (KLOR-CON) 20 MEQ tablet Take 1 tablet (20 mEq total) by mouth daily. 08/29/19   Reed, Tiffany L, DO  rosuvastatin (CRESTOR) 40 MG tablet Take 0.5 tablets (20 mg total) by mouth daily. 08/29/19   Reed, Tiffany L, DO  sodium chloride (OCEAN) 0.65 % SOLN nasal spray Place 1 spray into both nostrils as needed for congestion. 08/29/19   Reed, Tiffany L, DO  sucralfate (CARAFATE) 1 g tablet Take 1 tablet (1 g total) by mouth as needed. For stomach discomfort Patient taking differently: Take 1 g by mouth as needed (stomach discomfort).  08/29/19   Reed, Tiffany L, DO  ticagrelor (BRILINTA) 90 MG TABS tablet Take 1 tablet (90 mg total) by mouth 2 (two) times daily. 08/29/19   Reed, Tiffany L, DO  umeclidinium bromide (INCRUSE ELLIPTA) 62.5 MCG/INH AEPB Inhale 1 puff into the lungs daily.  08/29/19   Reed, Tiffany L, DO  Vitamin D, Ergocalciferol, (DRISDOL) 1.25 MG (50000 UNIT) CAPS capsule Take 1 capsule (50,000 Units total) by mouth every 7 (seven) days. 08/29/19   Gayland Curry, DO     Critical care time: 35 min  Jamesetta So, M.D.

## 2019-09-24 LAB — BASIC METABOLIC PANEL
Anion gap: 10 (ref 5–15)
BUN: 28 mg/dL — ABNORMAL HIGH (ref 8–23)
CO2: 33 mmol/L — ABNORMAL HIGH (ref 22–32)
Calcium: 9.4 mg/dL (ref 8.9–10.3)
Chloride: 97 mmol/L — ABNORMAL LOW (ref 98–111)
Creatinine, Ser: 0.68 mg/dL (ref 0.44–1.00)
GFR calc Af Amer: >60 mL/min (ref >60)
GFR calc non Af Amer: >60 mL/min (ref >60)
Glucose, Bld: 152 mg/dL — ABNORMAL HIGH (ref 70–99)
Potassium: 3.7 mmol/L (ref 3.5–5.1)
Sodium: 140 mmol/L (ref 135–145)

## 2019-09-24 LAB — GLUCOSE, CAPILLARY
Glucose-Capillary: 152 mg/dL — ABNORMAL HIGH (ref 70–99)
Glucose-Capillary: 144 mg/dL — ABNORMAL HIGH (ref 70–99)
Glucose-Capillary: 118 mg/dL — ABNORMAL HIGH (ref 70–99)
Glucose-Capillary: 127 mg/dL — ABNORMAL HIGH (ref 70–99)
Glucose-Capillary: 130 mg/dL — ABNORMAL HIGH (ref 70–99)

## 2019-09-24 LAB — CBC WITH DIFFERENTIAL/PLATELET
Abs Immature Granulocytes: 0.08 10*3/uL — ABNORMAL HIGH (ref 0.00–0.07)
Basophils Absolute: 0 10*3/uL (ref 0.0–0.1)
Basophils Relative: 0 %
Eosinophils Absolute: 0 10*3/uL (ref 0.0–0.5)
Eosinophils Relative: 0 %
HCT: 30.5 % — ABNORMAL LOW (ref 36.0–46.0)
Hemoglobin: 8.5 g/dL — ABNORMAL LOW (ref 12.0–15.0)
Immature Granulocytes: 1 %
Lymphocytes Relative: 16 %
Lymphs Abs: 1.1 10*3/uL (ref 0.7–4.0)
MCH: 26.1 pg (ref 26.0–34.0)
MCHC: 27.9 g/dL — ABNORMAL LOW (ref 30.0–36.0)
MCV: 93.6 fL (ref 80.0–100.0)
Monocytes Absolute: 0.9 10*3/uL (ref 0.1–1.0)
Monocytes Relative: 12 %
Neutro Abs: 5 10*3/uL (ref 1.7–7.7)
Neutrophils Relative %: 71 %
Platelets: 372 10*3/uL (ref 150–400)
RBC: 3.26 MIL/uL — ABNORMAL LOW (ref 3.87–5.11)
RDW: 18.9 % — ABNORMAL HIGH (ref 11.5–15.5)
WBC: 7.1 10*3/uL (ref 4.0–10.5)
nRBC: 0 % (ref 0.0–0.2)

## 2019-09-24 MED ORDER — CLONAZEPAM 1 MG PO TABS
1.0000 mg | ORAL_TABLET | Freq: Every day | ORAL | Status: DC
  Administered 2019-09-24 – 2019-09-28 (×4): 1 mg
  Filled 2019-09-24 (×5): qty 1

## 2019-09-24 NOTE — Progress Notes (Signed)
PROGRESS NOTE    Chelsea Allison  WUX:[id number] DOB: 07/07/1955 DOA: 09/05/2019 PCP: Beckie Salts, MD    Brief Narrative:  Patient admitted to the hospital with a working diagnosis of acute on chronic hypoxic hypercapnic respiratory failure due to pneumonia, and COPD exacerbation (present on admission).  Now status post tracheostomy.  64 year old female who presented with dyspnea.  She does have significant past medical history for coronary artery disease status post PCI October 2020, COPD, dyslipidemia, hypertension, heart failure, non-small cell lung cancer stage III status post partial lung resection, and anal cancer status post colectomy.  She had recent hospitalization June 6 to 18, 2021 where she required invasive mechanical ventilation for respiratory distress, due to COPD exacerbation.  Patient developed respiratory distress and hypoxia at home, brought to the hospital by EMS.  Apparently patient was found by a friend obtunded, with a respiratory stress.  Patient received supplemental oxygen, at the arrival to the emergency department she was unable to protect her airway and she was intubated and placed on mechanical ventilation.  Her blood pressure was 76/63, heart rate 128, temperature 98.1, respiratory 21 on mechanical ventilation oxygen saturation 93%.  Her lungs had diffuse rhonchi bilaterally with scattered rales, heart S1-S2, present, rhythmic, abdomen soft, obese, no lower extremity edema. ABG pH 7.21, PCO2 78.7, PO2 415, bicarb 34. Her chest x-ray had bilateral interstitial markings, predominantly hilar regions/ right upper.  Scarring right base. EKG 124 bpm, normal axis, normal intervals, sinus rhythm, PACs, with poor R wave progression, no ST segment or T wave changes, low voltage.  Patient was admitted to intensive care unit, continue invasive mechanical ventilation, she was placed on antibiotic therapy and aggressive bronchodilators.  Patient failed spontaneous breathing  trials with pressure support, on July 6 patient underwent tracheostomy.  Patient suffered from anxiety that delayed her liberation from mechanical ventilation.  She had copious secretions.  She was diagnosed with tracheitis/ pneumonia due to Klebsiella and E. coli.  Patient was placed on dexmedetomidine with good toleration, eventually transition to trach collar.  Transferred to [facility] July 17.   At night patient developed agitation with severe respiratory distress, requiring invasive mechanical ventilation.   Patient has remained now 24 H off invasive mechanical ventilation with good toleartoin, will transfer to progressive care unit.    Assessment & Plan:   Active Problems:   COPD exacerbation (HCC)   Respiratory failure (HCC)   Acute on chronic respiratory failure with hypoxia and hypercapnia (HCC)   Pressure injury of skin   Status post tracheostomy ([place])   1. Acute on chronic hypoxic and hypercapnic respiratory failure due to COPD exacerbation and right upper lobe pneumonia (present on admission). Sp tracheostomy 07/06. Cuffed trach #6. Now she has been 24 H off mechanical ventilation and managing secretions well.  Continue using a one way valve for speaking. Continue on 8 L per min per trach collar with good toleration. Trach cuff is deflated.   On bronchodilator therapy, and oxymetry monitoring. Patient will need SNF at discharge. Pending follow up evaluation from speech, she continue to have NG tube in place for feedings.   Continue antibiotic therapy with cefazolin, to completer total of 7 doses.   2. Metabolic encephalopathy/ chronic pain syndrome. Her encephalopathy continue to improve. Today she is more awake and alert, following commands, not reported agitation last night.   Change clonazepam to qhs from bid, and discontinue IV lorazepam.  Continue with quetiapine bid. Neuro checks and aspiration precautions, nutritional supplementation.   Pain control  with  buprenorphine and oxycodone.   3. Acute on chronic systolic heart failure exacerbation. Echocardiogram with LV systolic function 35 to 44%. Urine out put over last 24 H is 2,200 ml  Stable volume status, will continue to hold on IV diuretic therapy for now, will resume her oral furosemide 20 mg per home regimen in am.   4. Iron deficiency anemia, non small cell lung cancer, anal cancer. Hgb stable at 8,5 and Hct at 30.5. Plt at 372. Will follow on cell count as needed.   5,. T2DM / dyslipidemia, fasting glucose is 152 this am, on nsulin sliding scale for glucose cover and monitoring.   Continue with rosuvastatin.   6. Calorie protein malnutrition/ moderate. Sage 3 sacrum pressure ulcer, left hip pressure ulcer (not able to stage) On nutritional supplements per tube feedings. Local wound care.    Patient continue to be at high risk for worsening respiratory failure.   Status is: Inpatient  Remains inpatient appropriate because:Inpatient level of care appropriate due to severity of illness   Dispo: The patient is from: Home              Anticipated d/c is to: SNF              Anticipated d/c date is: 3 days              Patient currently is not medically stable to d/c.   DVT prophylaxis: Enoxaparin   Code Status:   full  Family Communication:  No family at the bedside       Nutrition Status: Nutrition Problem: Inadequate oral intake Etiology: acute illness Signs/Symptoms: NPO status Interventions: Tube feeding     Skin Documentation: Pressure Injury 09/07/19 Sacrum Unstageable - Full thickness tissue loss in which the base of the injury is covered by slough (yellow, tan, gray, green or brown) and/or eschar (tan, brown or black) in the wound bed. (Active)  09/07/19 1000  Location: Sacrum  Location Orientation:   Staging: Unstageable - Full thickness tissue loss in which the base of the injury is covered by slough (yellow, tan, gray, green or brown) and/or eschar  (tan, brown or black) in the wound bed.  Wound Description (Comments):   Present on Admission: Yes     Pressure Injury 09/10/19 Sacrum Lower Stage 3 -  Full thickness tissue loss. Subcutaneous fat may be visible but bone, tendon or muscle are NOT exposed. 1x1x1 (Active)  09/10/19 1355  Location: Sacrum  Location Orientation: Lower  Staging: Stage 3 -  Full thickness tissue loss. Subcutaneous fat may be visible but bone, tendon or muscle are NOT exposed.  Wound Description (Comments): 1x1x1  Present on Admission:      Pressure Injury 09/10/19 Lip Left skin breakdown from ETT (Active)  09/10/19 0700  Location: Lip  Location Orientation: Left  Staging:   Wound Description (Comments): skin breakdown from ETT  Present on Admission:       Subjective: Patient is more awake and alert, follows commands and responds to simple questions. She has been off mechanical ventilation for the last 24 H  Objective: Vitals:   09/24/19 0500 09/24/19 0600 09/24/19 0700 09/24/19 0812  BP: (!) 116/50 125/78 (!) 114/53   Pulse: 98 96 94   Resp: (!) 26 (!) 26 (!) 24   Temp:    98.5 F (36.9 C)  TempSrc:    Oral  SpO2: 91% 98% 96%   Weight:      Height:  Intake/Output Summary (Last 24 hours) at 09/24/2019 0923 Last data filed at 09/24/2019 0745 Gross per 24 hour  Intake 1604.95 ml  Output 1100 ml  Net 504.95 ml   Filed Weights   09/17/19 0403 09/19/19 0600 09/20/19 0500  Weight: 81.1 kg 79.9 kg 79.5 kg    Examination:   General: Not in pain or dyspnea. Deconditioned  Neurology: Awake and alert, non focal  E ENT: no pallor, no icterus, oral mucosa moist/ trach in place, balloon deflated Cardiovascular: No JVD. S1-S2 present, rhythmic, no gallops, rubs, or murmurs. No lower extremity edema. Pulmonary: positive breath sounds bilaterally, no wheezing, mild bilateral proximal rhonchi but no rales. Gastrointestinal. Abdomen soft with no organomegaly, non tender, no rebound or  guarding Skin. No rashes Musculoskeletal: no joint deformities     Data Reviewed: I have personally reviewed following labs and imaging studies  CBC: Recent Labs  Lab 09/18/19 0221 09/18/19 0221 09/19/19 0402 09/22/19 0408 09/23/19 0251 09/23/19 0504 09/24/19 0317  WBC 9.0  --  9.6  --  12.6*  --  7.1  NEUTROABS  --   --   --   --   --   --  5.0  HGB 9.3*   < > 9.8* 10.5* 9.5* 10.9* 8.5*  HCT 33.3*   < > 34.0* 31.0* 33.1* 32.0* 30.5*  MCV 92.0  --  90.2  --  92.5  --  93.6  PLT 590*  --  537*  --  396  --  372   < > = values in this interval not displayed.   Basic Metabolic Panel: Recent Labs  Lab 09/19/19 0402 09/19/19 0402 09/20/19 1538 09/20/19 1538 09/21/19 0413 09/21/19 0413 09/22/19 0322 09/22/19 0408 09/23/19 0251 09/23/19 0504 09/24/19 0317  NA 134*   < > 139   < > 138   < > 139 140 137 139 140  K 2.9*   < > 3.5   < > 3.9   < > 3.6 3.2* 3.5 3.4* 3.7  CL 89*   < > 95*  --  94*  --  95*  --  95*  --  97*  CO2 32   < > 33*  --  34*  --  32  --  30  --  33*  GLUCOSE 200*   < > 114*  --  156*  --  203*  --  191*  --  152*  BUN 25*   < > 22  --  20  --  18  --  30*  --  28*  CREATININE 0.89   < > 0.68  --  0.72  --  0.74  --  0.85  --  0.68  CALCIUM 9.6   < > 9.4  --  9.3  --  9.8  --  9.1  --  9.4  MG 2.3  --   --   --   --   --   --   --  2.2  --   --   PHOS 4.1  --   --   --   --   --   --   --  4.4  --   --    < > = values in this interval not displayed.   GFR: Estimated Creatinine Clearance: 81.2 mL/min (by C-G formula based on SCr of 0.68 mg/dL). Liver Function Tests: No results for input(s): AST, ALT, ALKPHOS, BILITOT, PROT, ALBUMIN in the last 168 hours. No results for  input(s): LIPASE, AMYLASE in the last 168 hours. No results for input(s): AMMONIA in the last 168 hours. Coagulation Profile: No results for input(s): INR, PROTIME in the last 168 hours. Cardiac Enzymes: No results for input(s): CKTOTAL, CKMB, CKMBINDEX, TROPONINI in the last 168  hours. BNP (last 3 results) No results for input(s): PROBNP in the last 8760 hours. HbA1C: No results for input(s): HGBA1C in the last 72 hours. CBG: Recent Labs  Lab 09/23/19 1616 09/23/19 1952 09/23/19 2346 09/24/19 0338 09/24/19 0815  GLUCAP 127* 130* 138* 152* 144*   Lipid Profile: No results for input(s): CHOL, HDL, LDLCALC, TRIG, CHOLHDL, LDLDIRECT in the last 72 hours. Thyroid Function Tests: No results for input(s): TSH, T4TOTAL, FREET4, T3FREE, THYROIDAB in the last 72 hours. Anemia Panel: No results for input(s): VITAMINB12, FOLATE, FERRITIN, TIBC, IRON, RETICCTPCT in the last 72 hours.    Radiology Studies: I have reviewed all of the imaging during this hospital visit personally     Scheduled Meds: . vitamin C  250 mg Per Tube BID  . aspirin  81 mg Per Tube Daily  . buprenorphine  1 patch Transdermal Weekly   And  . buprenorphine  1 patch Transdermal Weekly  . chlorhexidine gluconate (MEDLINE KIT)  15 mL Mouth Rinse BID  . Chlorhexidine Gluconate Cloth  6 each Topical Daily  . clonazePAM  1 mg Per Tube BID  . collagenase   Topical Daily  . docusate  100 mg Per Tube BID  . enoxaparin (LOVENOX) injection  40 mg Subcutaneous Q24H  . feeding supplement (PROSource TF)  45 mL Per Tube TID  . free water  50 mL Per Tube Q8H  . guaiFENesin  15 mL Per Tube Q6H  . insulin aspart  0-15 Units Subcutaneous Q4H  . ipratropium-albuterol  3 mL Nebulization Q6H  . mouth rinse  15 mL Mouth Rinse 10 times per day  . oxyCODONE  10 mg Per Tube Q6H  . pantoprazole sodium  40 mg Per Tube Daily  . polyethylene glycol  17 g Per Tube Daily  . QUEtiapine  25 mg Per Tube BID  . rosuvastatin  20 mg Per Tube Daily  . ticagrelor  90 mg Per Tube BID  . vitamin B-12  1,000 mcg Per Tube QODAY  . Vitamin D (Ergocalciferol)  50,000 Units Per Tube Q7 days  . zinc sulfate  220 mg Per Tube Daily   Continuous Infusions: . sodium chloride 10 mL/hr at 09/24/19 0700  .  ceFAZolin (ANCEF)  IV 2 g (09/24/19 0112)  . feeding supplement (VITAL 1.5 CAL) 1,000 mL (09/24/19 0515)     LOS: 19 days        Frans Valente Gerome Apley, MD

## 2019-09-25 ENCOUNTER — Inpatient Hospital Stay (HOSPITAL_COMMUNITY): Payer: Medicare HMO

## 2019-09-25 DIAGNOSIS — I10 Essential (primary) hypertension: Secondary | ICD-10-CM

## 2019-09-25 DIAGNOSIS — E785 Hyperlipidemia, unspecified: Secondary | ICD-10-CM

## 2019-09-25 DIAGNOSIS — Z85048 Personal history of other malignant neoplasm of rectum, rectosigmoid junction, and anus: Secondary | ICD-10-CM

## 2019-09-25 DIAGNOSIS — Z85118 Personal history of other malignant neoplasm of bronchus and lung: Secondary | ICD-10-CM

## 2019-09-25 LAB — GLUCOSE, CAPILLARY
Glucose-Capillary: 110 mg/dL — ABNORMAL HIGH (ref 70–99)
Glucose-Capillary: 144 mg/dL — ABNORMAL HIGH (ref 70–99)
Glucose-Capillary: 137 mg/dL — ABNORMAL HIGH (ref 70–99)
Glucose-Capillary: 115 mg/dL — ABNORMAL HIGH (ref 70–99)
Glucose-Capillary: 97 mg/dL (ref 70–99)
Glucose-Capillary: 122 mg/dL — ABNORMAL HIGH (ref 70–99)
Glucose-Capillary: 94 mg/dL (ref 70–99)

## 2019-09-25 LAB — BLOOD GAS, ARTERIAL
Acid-Base Excess: 1.4 mmol/L (ref 0.0–2.0)
Bicarbonate: 30.5 mmol/L — ABNORMAL HIGH (ref 20.0–28.0)
Drawn by: 36277
FIO2: 100
O2 Saturation: 89.7 %
Patient temperature: 37
pCO2 arterial: 102 mmHg (ref 32.0–48.0)
pH, Arterial: 7.101 — CL (ref 7.350–7.450)
pO2, Arterial: 81.7 mmHg — ABNORMAL LOW (ref 83.0–108.0)

## 2019-09-25 MED ORDER — FUROSEMIDE 10 MG/ML IJ SOLN
INTRAMUSCULAR | Status: AC
  Administered 2019-09-25: 40 mg via INTRAVENOUS
  Filled 2019-09-25: qty 4

## 2019-09-25 MED ORDER — PROPOFOL 1000 MG/100ML IV EMUL
0.0000 ug/kg/min | INTRAVENOUS | Status: DC
  Administered 2019-09-26: 5 ug/kg/min via INTRAVENOUS
  Filled 2019-09-25: qty 100

## 2019-09-25 MED ORDER — FENTANYL CITRATE (PF) 100 MCG/2ML IJ SOLN
50.0000 ug | INTRAMUSCULAR | Status: AC | PRN
  Administered 2019-09-29 – 2019-10-03 (×3): 50 ug via INTRAVENOUS
  Filled 2019-09-25 (×3): qty 2

## 2019-09-25 MED ORDER — FUROSEMIDE 10 MG/ML IJ SOLN: 40.0000 mg | Freq: Once | INTRAMUSCULAR | Status: AC

## 2019-09-25 MED ORDER — FENTANYL CITRATE (PF) 100 MCG/2ML IJ SOLN
50.0000 ug | INTRAMUSCULAR | Status: DC | PRN
  Administered 2019-09-26: 100 ug via INTRAVENOUS

## 2019-09-25 NOTE — Progress Notes (Signed)
RT removed tracheostomy sutures per MD order. Patient appeared to tolerate well.

## 2019-09-25 NOTE — Progress Notes (Signed)
NAME:  Chelsea Allison, MRN:  [id number], DOB:  Jun 20, 1955, LOS: 82 ADMISSION DATE:  09/05/2019, CONSULTATION DATE:  09/05/19 REFERRING MD:  Stark Jock- EM, CHIEF COMPLAINT:  Respiratory Distress, AMS  Brief History   64 yo female smoker presented to ED with altered mental status from acute on chronic hypoxic/hypercapnic respiratory failure 2nd to HCAP in setting of COPD.  Past Medical History  NSCLS s/p partial resection Anal cancer s/p colostomy CAD with MI s/p PCI COPD on home O2 HFrEF Tobacco abuse  Acute on chronic respiratory failure requiring intubation, repeated   Significant Hospital Events   6/29 admitted to [facility]  7/1 hgb drop.  7/2 SBTx4h 7/4 only tolerated 1min on pressure support. Starting precedex/klonapin. Obtaining hemolytic labs. 7/6 trached 7/9: Copious secretions still noted.  Tachypneic.  Mildly hypotensive.  Presuming anxiety complicating weaning efforts titration of anxiolytic regimen performed, starting aerosol trach collar trials 7/10: Was on trach collar, pulse oximetry dropping to 40s supplemental oxygen increase but still on the needed achieve 70% FiO2 placed back on ventilator lavaged for pink-tan secretions saturations improved back on vent, checking sputum culture and chest x-ray. 7/11 looks better. Working on weaning  7/19 transfer back to ICU after severe desaturations and also hypercarbic failure.   Procedures:  ETT 6/29-7/6 Tracheostomy 7/6  Significant Diagnostic Tests:  CXR 7/2: appropriate positioning of ETT, NG tubes, no acute overlying lung processes noted  CXR 7/6: new patchy left lung base opacity which could represent atelectasis vs aspiration vs pna, tracheostomy in place  Micro Data:  6/29 SARS Cov2>negative 6/29 tracheal aspirate>  6/29 UCx> less than 10,000 colonies insignificant growth 6/29 BCx> NG 6/29 MRSA PCR>negative 7/10: Sputum culture: E. coli, Klebsiella  Antimicrobials:  6/29 vancomycin>6/30 6/29 pip/tazo> 7/3 Ancef  7/12 >>   Interim history/subjective:  Called emergently to bedside 7/19 PM for hypoxia (O2 sats dropped to 40%, recovered with bagging), alteration in mental status, and hypercarbia on ABG with pH 7.1. Patient was bagged en route to ICU.   Objective   Blood pressure 103/68, pulse (!) 103, temperature 98.4 F (36.9 C), temperature source Oral, resp. rate (!) 30, height 5\' 9"  (1.753 m), weight 82.1 kg, SpO2 98 %.    FiO2 (%):  [28 %] 28 %   Intake/Output Summary (Last 24 hours) at 09/25/2019 2338 Last data filed at 09/25/2019 1500 Gross per 24 hour  Intake 960 ml  Output 175 ml  Net 785 ml   Filed Weights   09/19/19 0600 09/20/19 0500 09/25/19 0122  Weight: 79.9 kg 79.5 kg 82.1 kg    Examination: General: chronically ill appearing elderly female.  HEENT: 6 cuffed Shiley trach midline, PERRL, no appreciable JVD Neuro: Unresponsive CV: tachy, regular, no MRG PULM: Rhonchi L, decreased breath sounds R. GI: soft, hypoactive, non-distended Extremities: warm/dry, no edema  Skin: no rashes or lesions  Assessment & Plan:   Acute on chronic hypercapnic hypoxic respiratory failure post COVID pneumonia and now s/p antibiotic course for for E.coli/Klebsiella PNA. S/p tracheostomy 7/6 for inability to wean off vent and repeated intubations COPD s/p acute exacerbation - Transfer back to ICU for vent - Routine trach care - Repeat ABG 1 hour after vent started - PT/OT  - Wean vent as tolerated.  - I fear she may never progress to true vent liberation.  - Upon arrival to ICU very agitated on vent. PRN fentanyl and propofol infusion for RASS -2.   Chronic cancer related pain Anxiety  Current regimen: Seroquel 25 BID,  buprenorphine patch, Klonopin at bedtime, and Oxy 10 mg q6 - Hold until her mental status recovers.  - Supportive care  Chronic HFrEF w/ h/o CAD  EF 35-40% on 08/12/2019 - Continue telemetry  - Continue aspirin Brilinta and Daily assessment for diuresis   Iron  deficient Anemia.  Stable and Hgb trending up. No obvious source of bleeding. - Trend CBC intermittently and transfuse for hemoglobin less than 7  DM type 2 - Blood sugars stable - Plan - Sliding scale insulin  Hx NSCLC s/p partial resection XLT. Hx anal cancer s/p colectomy. Plan - Outpatient follow-up at some point  High risk malnutrition.  Plan - Tube feeds  Best practice:  Diet: tube feeds DVT prophylaxis: lovenox GI prophylaxis: protonix Mobility: PT Code Status: Full  Disposition:  Transfer to ICU, will likely dispo to [facility] Family update: updated patient   Signature:   Georgann Housekeeper, AGACNP-BC [place]  See Amion for personal pager PCCM on call pager [phone]  09/25/2019 11:51 PM

## 2019-09-25 NOTE — Progress Notes (Addendum)
NAME:  Chelsea Allison, MRN:  [id number], DOB:  April 04, 1955, LOS: 20 ADMISSION DATE:  09/05/2019, CONSULTATION DATE:  09/05/19 REFERRING MD:  Stark Jock- EM, CHIEF COMPLAINT:  Respiratory Distress, AMS  Brief History   64 yo female smoker presented to ED with altered mental status from acute on chronic hypoxic/hypercapnic respiratory failure 2nd to HCAP in setting of COPD.  Past Medical History  NSCLS s/p partial resection Anal cancer s/p colostomy CAD with MI s/p PCI COPD on home O2 HFrEF Tobacco abuse  Acute on chronic respiratory failure requiring intubation, repeated   Significant Hospital Events   6/29 admitted to [facility]  7/1 hgb drop.  7/2 SBTx4h 7/4 only tolerated 84min on pressure support. Starting precedex/klonapin. Obtaining hemolytic labs. 7/6 trached 7/9: Copious secretions still noted.  Tachypneic.  Mildly hypotensive.  Presuming anxiety complicating weaning efforts titration of anxiolytic regimen performed, starting aerosol trach collar trials 7/10: Was on trach collar, pulse oximetry dropping to 40s supplemental oxygen increase but still on the needed achieve 70% FiO2 placed back on ventilator lavaged for pink-tan secretions saturations improved back on vent, checking sputum culture and chest x-ray. 7/11 looks better. Working on weaning   Procedures:  ETT 6/29-7/6 Tracheostomy 7/6  Significant Diagnostic Tests:  CXR 7/2: appropriate positioning of ETT, NG tubes, no acute overlying lung processes noted  CXR 7/6: new patchy left lung base opacity which could represent atelectasis vs aspiration vs pna, tracheostomy in place  Micro Data:  6/29 SARS Cov2>negative 6/29 tracheal aspirate>  6/29 UCx> less than 10,000 colonies insignificant growth 6/29 BCx> NG 6/29 MRSA PCR>negative 7/10: Sputum culture: E. coli, Klebsiella  Antimicrobials:  6/29 vancomycin>6/30 6/29 pip/tazo> 7/3 Ancef 7/12 >>   Interim history/subjective:  No acute issues overnight, patient seen  resting comfortably in bed on assessment.  Request temperature adjustment in room.  Objective   Blood pressure 120/65, pulse 98, temperature (!) 97.4 F (36.3 C), temperature source Axillary, resp. rate (!) 24, height 5\' 9"  (1.753 m), weight 82.1 kg, SpO2 98 %.    FiO2 (%):  [28 %] 28 %   Intake/Output Summary (Last 24 hours) at 09/25/2019 1423 Last data filed at 09/25/2019 0000 Gross per 24 hour  Intake 691.28 ml  Output 735 ml  Net -43.72 ml   Filed Weights   09/19/19 0600 09/20/19 0500 09/25/19 0122  Weight: 79.9 kg 79.5 kg 82.1 kg    Examination: General: Chronically ill appearing deconditioned middle-aged female lying in bed on aerosolized trach collar in no acute distress HEENT: 6 cuffed Shiley trach midline, MM pink/moist, PERRL, sclera nonicteric Neuro: Alert and oriented x3, able to follow all commands, nonfocal CV: s1s2 regular rate and rhythm, no murmur, rubs, or gallops,  PULM: Faint expiratory wheeze bilaterally, no increased work of breathing, oxygen saturations 95 to 99% on 28% FiO2 through ATC GI: soft, bowel sounds active in all 4 quadrants, non-tender, non-distended, tolerating TF Extremities: warm/dry, no edema  Skin: no rashes or lesions  Assessment & Plan:  Chelsea Allison is a 29 yp female including but not limited to CAD/STEMI HTN, HLD, October 2020 s/p PCI, COPD,  HFrEF, non small cell lung cancer stage III s/p partial lung resection, anal cancer s/p colostomy admitted on 6/29 for acute on chronic hypoxic/hypercapnic respiratory failure from HCAP.  Acute on chronic hypercapnic hypoxic respiratory failure  -S/p tracheostomy 7/6 for inability to wean off vent and repeated intubations COPD exacerbation complicated by severe anxiety -Anxiety, underlying lung mechanics, and copious pulmonary secretions have contributed  to prolonged hospital stay and difficulty progressing Plan Remains on ATC, tolerating well Goal for continued ATC Routine trach care Continued  encouragement of pulmonary hygiene, currently tolerating secretions well Status post antibiotics for pansensitive E. coli and Klebsiella pneumonia Out of bed, mobilize as able PT/OT consult Discharge disposition pending  Chronic cancer related pain and anxiety.  -Current regimen: Seroquel 25 BID, buprenorphine patch, Klonopin at bedtime, and Oxy 10 mg q6 Plan Appears comfortable on current regiment Supportive care   Rest of chronic medical conditions including: Chronic HFrEF with history of CAD Iron deficiency anemia Type 2 diabetes History of NSCLC status post partial resection History of anal cancer status post colectomy High risk Malnutrition Now being managed by hospitalist team and they are now primary   PCCM will continue to follow weekly for trach care  Please call if assistance is needed   Best practice:  Diet: tube feeds DVT prophylaxis: lovenox GI prophylaxis: protonix Mobility: PT Code Status: Full  Disposition:  Transfer from ICU. TRH to assume care, ccm will follow peripherally for trach maintenance Family update: updated patient   Signature:  Johnsie Cancel, NP-C [facility] Contact / Pager information can be found on Amion  09/25/2019, 2:47 PM

## 2019-09-25 NOTE — Progress Notes (Signed)
°  Speech Language Pathology Treatment: Dysphagia;Passy Muir Speaking valve  Patient Details Name: Chelsea Allison MRN: [id number] DOB: 1955-10-13 Today's Date: 09/25/2019 Time: 3536-1443 SLP Time Calculation (min) (ACUTE ONLY): 27 min  Assessment / Plan / Recommendation Clinical Impression  Chelsea Allison is progressing well towards goals. Facilitated communication and swallowing ability with speaking valve and trials of jello and water. There are periods of phonation breaks and low intensity remedied with exaggerated inhalation. Respiration rate 25-33 and stable throughout session over 18-20 min period. Valve assisted in mobilizing mucous to trach level but not expelled.   Jello and teaspoon/cup sips water consumed with subjectively timely swallow response and phonatory breaks when vocalizing. No outward cough or throat clearing. She is ready for instrumental testing which will be performed tomorrow.    HPI HPI: 64 yo f with a PMHx of CAD/STEMI in Oct. 2020 (s/p PCI), COPD on home O2, NSCLC s/p partial lung resection, colon ca, CHF presented 6/3 with shortness of breath and elevated troponin. Negative cardiac cath and treated for acute COPD exacerbation with improvement. 6/5 respiratory distress; admitted to ICU, intubated 6/5-6/6. Hypercarbic resp failure with reintubation 6/7-6/8. Reintubated 6/8 due to acidosis and resp distress, extubated 6/12. Bronch 6/10 revealed significant airway distortion and stenosis of the R airways likely due to fibrosis s/p RUL lobectomy and radiation. 7/6 trached.       SLP Plan  Other (Comment) (instrumental testing 7/20)       Recommendations  Diet recommendations: NPO Medication Administration: Via alternative means      Patient may use Passy-Muir Speech Valve: Intermittently with supervision PMSV Supervision: Full MD: Please consider changing trach tube to : Cuffless         Oral Care Recommendations: Oral care BID Follow up Recommendations: Skilled Nursing  facility SLP Visit Diagnosis: Dysphagia, unspecified (R13.10) Plan: Other (Comment) (instrumental testing 7/20)                       Houston Siren 09/25/2019, 11:58 AM   Orbie Pyo Colvin Caroli.Ed Risk analyst [phone] Office [phone]

## 2019-09-25 NOTE — Progress Notes (Signed)
RN called RT at 2236 due to patient stating she wanted trach changed she couldn't breath. Informed RN we couldn't change trach at night, however, we could change inner cannula and to go ahead and suction patient. RN called again at 2241 stating patients SATs were in the 40's after suctioning. RT walking in patients room at the time of the call. RT changed inner cannula and suctioned again meeting resistance with no secretions obtained. RT began to bag patient with no significant resistance with bagging. SATs increased to the 80's. MD at bedside, continued to bag patient while an ABG was obtained. Patients SATS withholding in the 90's with frequent suctioning at this point obtaining copious amounts of tan thin/frothy secretions. Once critical ABG results were called to RN, MD ordered to continue to bag patient until patient transferred to ICU and to be placed on ventilator. RN, Rapid Response RN and Charge RT transferred patient to ICU.

## 2019-09-25 NOTE — Progress Notes (Signed)
PROGRESS NOTE    Chelsea Allison  TTS:[id number] DOB: 1955/03/15 DOA: 09/05/2019 PCP: Beckie Salts, MD    Brief Narrative:  Patient admitted to the hospital with a working diagnosis of acute on chronic hypoxic hypercapnic respiratory failure due to pneumonia, and COPD exacerbation(present on admission). Now status post tracheostomy.  64 year old female who presented with dyspnea. She does have significant past medical history for coronary artery disease status post PCI October 2020, COPD, dyslipidemia, hypertension, heart failure, non-small cell lung cancer stage III status post partial lung resection, and anal cancer status post colectomy.  She had recent hospitalization June6 to18, 2021 where she required invasive mechanical ventilation for respiratory distress,due toCOPD exacerbation.  Patient developed respiratory distress and hypoxia at home, brought to the hospital by EMS. Apparently patient was found by a friend obtunded, with a respiratory stress. Patient received supplemental oxygen, at the arrival to the emergency department she was unable to protect her airway and she was intubated and placed on mechanical ventilation. Her blood pressure was 76/63, heart rate 128, temperature 98.1, respiratory 21 on mechanical ventilation oxygen saturation 93%. Her lungs had diffuse rhonchi bilaterally with scattered rales, heart S1-S2, present, rhythmic, abdomen soft, obese, no lower extremity edema. ABG pH 7.21, PCO2 78.7, PO2 415, bicarb 34. Her chest x-ray had bilateral interstitial markings, predominantly hilar regions/ right upper. Scarring right base. EKG124bpm, normal axis, normal intervals, sinus rhythm, PACs,with poor R wave progression, no ST segment or T wave changes, low voltage.  Patient was admitted to intensive care unit, continue invasive mechanical ventilation, she was placed on antibiotic therapy and aggressive bronchodilators.  Patient failed spontaneous breathing  trials with pressure support, on July 6 patient underwent tracheostomy. Patient suffered from anxiety that delayed her liberation from mechanical ventilation. She had copious secretions. She was diagnosed with tracheitis/ pneumoniadue to Klebsiella and E. coli.  Patient was placed on dexmedetomidine with good toleration, eventually transition to trach collar.  Transferred to [facility] July 17.   At night patient developed agitationwithsevere respiratory distress, requiringinvasive mechanical ventilation.   Patient has tolerated  24 H off invasive mechanical ventilation with good toleartoin, and she was transferred to the progressive care unit.    Assessment & Plan:   Principal Problem:   Acute on chronic respiratory failure with hypoxia and hypercapnia (HCC) Active Problems:   COPD exacerbation (HCC)   Respiratory failure (HCC)   History of lung cancer in adulthood   History of anal cancer   Pressure injury of skin   Status post tracheostomy ([place])   Essential hypertension   Dyslipidemia   1. Acute on chronic hypoxic and hypercapnic respiratory failure due to COPD exacerbation and right upper lobe pneumonia (present on admission). Sp tracheostomy 07/06. Cuffed trach #6/ . Patient continue to be stable from respiratory status, she is managing secretions well and tolerating trach collar. Balloon is deflated and will remove sutures today. She has some bleeding at the trach site this am.   Continue with bronchodilator therapy, oxymetry monitoring and speech/ swallow therapy. Patient is more awake today, if swallow has improved and safe for po intake will remove NG tube and continue oral feedings.  Patient has completed cefazolin antimitotic therapy.   2. Metabolic encephalopathy/ chronic pain syndrome. No further agitation or confusion, able to communicate with one way valve per trach.   Continue with qhs clonazepam and bid quetiapine. Continue neuro checks per unit protocol and  nutritional support.   Analgesia with buprenorphine and oxycodone.   3. Acute on chronic systolic  heart failure exacerbation. Echocardiogram with LV systolic function 35 to 48%.  Continue to target negative fluid balance with oral furosemide, home dose is 20 mg daily. Continue asa and ticagrelor.   4. Iron deficiency anemia, non small cell lung cancer, anal cancer. Hgb stable at 8,5 and Hct at 30.5. Plt at 372.   Patient will need outpatient follow up.   5,. T2DM / dyslipidemia, Continue with insulin sliding scale for glucose cover and monitoring.   On rosuvastatin.   6. Calorie protein malnutrition/ moderate. Sage 3 sacrum pressure ulcer, left hip pressure ulcer (not able to stage) Continue with nutritional supplements and local wound care.    Status is: Inpatient  Remains inpatient appropriate because:Inpatient level of care appropriate due to severity of illness   Dispo: The patient is from: Home              Anticipated d/c is to: SNF              Anticipated d/c date is: 2 days              Patient currently is not medically stable to d/c.    DVT prophylaxis: Enoxaparin   Code Status:    full  Family Communication:  No family at the bedside      Nutrition Status: Nutrition Problem: Inadequate oral intake Etiology: acute illness Signs/Symptoms: NPO status Interventions: Tube feeding     Skin Documentation: Pressure Injury 09/07/19 Sacrum Unstageable - Full thickness tissue loss in which the base of the injury is covered by slough (yellow, tan, gray, green or brown) and/or eschar (tan, brown or black) in the wound bed. (Active)  09/07/19 1000  Location: Sacrum  Location Orientation:   Staging: Unstageable - Full thickness tissue loss in which the base of the injury is covered by slough (yellow, tan, gray, green or brown) and/or eschar (tan, brown or black) in the wound bed.  Wound Description (Comments):   Present on Admission: Yes     Pressure  Injury 09/10/19 Sacrum Lower Stage 3 -  Full thickness tissue loss. Subcutaneous fat may be visible but bone, tendon or muscle are NOT exposed. 1x1x1 (Active)  09/10/19 1355  Location: Sacrum  Location Orientation: Lower  Staging: Stage 3 -  Full thickness tissue loss. Subcutaneous fat may be visible but bone, tendon or muscle are NOT exposed.  Wound Description (Comments): 1x1x1  Present on Admission:      Pressure Injury 09/10/19 Lip Left skin breakdown from ETT (Active)  09/10/19 0700  Location: Lip  Location Orientation: Left  Staging:   Wound Description (Comments): skin breakdown from ETT  Present on Admission:      Consultants:   Pulmonary   Procedures:   trach  Antimicrobials:   Cefepime     Subjective: Patient is more awake and alert, continue to be NPO, dyspnea has been improving, no confusion or agitation. This am had bleeding at the trach site.   Objective: Vitals:   09/25/19 0122 09/25/19 0408 09/25/19 0830 09/25/19 0846  BP: (!) 113/47 131/77 120/65 120/65  Pulse: (!) 101 (!) 108  (!) 102  Resp: 20 20 (!) 24   Temp: 98.6 F (37 C) 98.4 F (36.9 C) (!) 97.4 F (36.3 C)   TempSrc:  Oral Axillary   SpO2: 90% 95% 98%   Weight: 82.1 kg     Height:        Intake/Output Summary (Last 24 hours) at 09/25/2019 1201 Last data filed at 09/25/2019  0000 Gross per 24 hour  Intake 811.28 ml  Output 735 ml  Net 76.28 ml   Filed Weights   09/19/19 0600 09/20/19 0500 09/25/19 0122  Weight: 79.9 kg 79.5 kg 82.1 kg    Examination:   General:deconditioned and ill looking appearing  Neurology: Awake and alert, non focal  E ENT: mild pallor, no icterus, oral mucosa moist/ trach in place with no signs of active bleeding Cardiovascular: No JVD. S1-S2 present, rhythmic, no gallops, rubs, or murmurs. No lower extremity edema. Pulmonary: positive breath sounds bilaterally, no wheezing, positive bilateral rhonchi  Gastrointestinal. Abdomen soft and non tender Skin.  No rashes Musculoskeletal: no joint deformities     Data Reviewed: I have personally reviewed following labs and imaging studies  CBC: Recent Labs  Lab 09/19/19 0402 09/22/19 0408 09/23/19 0251 09/23/19 0504 09/24/19 0317  WBC 9.6  --  12.6*  --  7.1  NEUTROABS  --   --   --   --  5.0  HGB 9.8* 10.5* 9.5* 10.9* 8.5*  HCT 34.0* 31.0* 33.1* 32.0* 30.5*  MCV 90.2  --  92.5  --  93.6  PLT 537*  --  396  --  956   Basic Metabolic Panel: Recent Labs  Lab 09/19/19 0402 09/19/19 0402 09/20/19 1538 09/20/19 1538 09/21/19 0413 09/21/19 0413 09/22/19 0322 09/22/19 0408 09/23/19 0251 09/23/19 0504 09/24/19 0317  NA 134*   < > 139   < > 138   < > 139 140 137 139 140  K 2.9*   < > 3.5   < > 3.9   < > 3.6 3.2* 3.5 3.4* 3.7  CL 89*   < > 95*  --  94*  --  95*  --  95*  --  97*  CO2 32   < > 33*  --  34*  --  32  --  30  --  33*  GLUCOSE 200*   < > 114*  --  156*  --  203*  --  191*  --  152*  BUN 25*   < > 22  --  20  --  18  --  30*  --  28*  CREATININE 0.89   < > 0.68  --  0.72  --  0.74  --  0.85  --  0.68  CALCIUM 9.6   < > 9.4  --  9.3  --  9.8  --  9.1  --  9.4  MG 2.3  --   --   --   --   --   --   --  2.2  --   --   PHOS 4.1  --   --   --   --   --   --   --  4.4  --   --    < > = values in this interval not displayed.   GFR: Estimated Creatinine Clearance: 82.5 mL/min (by C-G formula based on SCr of 0.68 mg/dL). Liver Function Tests: No results for input(s): AST, ALT, ALKPHOS, BILITOT, PROT, ALBUMIN in the last 168 hours. No results for input(s): LIPASE, AMYLASE in the last 168 hours. No results for input(s): AMMONIA in the last 168 hours. Coagulation Profile: No results for input(s): INR, PROTIME in the last 168 hours. Cardiac Enzymes: No results for input(s): CKTOTAL, CKMB, CKMBINDEX, TROPONINI in the last 168 hours. BNP (last 3 results) No results for input(s): PROBNP in the last 8760 hours. HbA1C: No results  for input(s): HGBA1C in the last 72  hours. CBG: Recent Labs  Lab 09/24/19 1652 09/24/19 2007 09/25/19 0125 09/25/19 0405 09/25/19 1035  GLUCAP 127* 130* 110* 144* 115*   Lipid Profile: No results for input(s): CHOL, HDL, LDLCALC, TRIG, CHOLHDL, LDLDIRECT in the last 72 hours. Thyroid Function Tests: No results for input(s): TSH, T4TOTAL, FREET4, T3FREE, THYROIDAB in the last 72 hours. Anemia Panel: No results for input(s): VITAMINB12, FOLATE, FERRITIN, TIBC, IRON, RETICCTPCT in the last 72 hours.    Radiology Studies: I have reviewed all of the imaging during this hospital visit personally     Scheduled Meds: . vitamin C  250 mg Per Tube BID  . aspirin  81 mg Per Tube Daily  . buprenorphine  1 patch Transdermal Weekly   And  . buprenorphine  1 patch Transdermal Weekly  . chlorhexidine gluconate (MEDLINE KIT)  15 mL Mouth Rinse BID  . Chlorhexidine Gluconate Cloth  6 each Topical Daily  . clonazePAM  1 mg Per Tube QHS  . collagenase   Topical Daily  . docusate  100 mg Per Tube BID  . enoxaparin (LOVENOX) injection  40 mg Subcutaneous Q24H  . feeding supplement (PROSource TF)  45 mL Per Tube TID  . free water  50 mL Per Tube Q8H  . guaiFENesin  15 mL Per Tube Q6H  . insulin aspart  0-15 Units Subcutaneous Q4H  . ipratropium-albuterol  3 mL Nebulization Q6H  . mouth rinse  15 mL Mouth Rinse 10 times per day  . oxyCODONE  10 mg Per Tube Q6H  . pantoprazole sodium  40 mg Per Tube Daily  . polyethylene glycol  17 g Per Tube Daily  . QUEtiapine  25 mg Per Tube BID  . rosuvastatin  20 mg Per Tube Daily  . ticagrelor  90 mg Per Tube BID  . vitamin B-12  1,000 mcg Per Tube QODAY  . Vitamin D (Ergocalciferol)  50,000 Units Per Tube Q7 days  . zinc sulfate  220 mg Per Tube Daily   Continuous Infusions: . sodium chloride Stopped (09/24/19 2139)  . feeding supplement (VITAL 1.5 CAL) 1,000 mL (09/24/19 2355)     LOS: 20 days        Sharlee Rufino Gerome Apley, MD

## 2019-09-25 NOTE — Progress Notes (Signed)
Order received for tracheostomy change. RT had NS to order two #6 CFLS trach's for patient. Chelsea Allison will be changed tomorrow due to time of day.

## 2019-09-25 NOTE — Progress Notes (Signed)
Occupational Therapy Evaluation Patient Details Name: Chelsea Allison MRN: [id number] DOB: 1956-01-06 Today's Date: 09/25/2019    History of Present Illness 64 yo female admitted from home 6/29 with respiratory distress and hypoxia.  Intubated in ED. Trach 7/6. pt with respiratory distress and return to vent 7/15. Was in hospital from 6/03 to 6/18 for COPD exacerbation, acute on chronic systolic CHF with NSTEMI requiring intubation who D/Cd to rehab then home 6/22.  PMHx: anal CA s/p colostomy, NSCLC s/p partial resection, CAD, CHF   Clinical Impression   Pt progressing towards OT goals and agreeable to work with OT this AM. Provided education on energy conservation strategies to maximize independence with fair carryover noted. Pt improved to Min A for [facility] transfer and toileting task with minor assistance needed to maintain steadiness in standing. Pt Mod A for LB dressing with cues for use of energy conservation strategies. Pt continues to be limited by decreased strength and low endurance as pt fatigued from ADL transfers this session. Pt reporting minor dizziness, but BP WFL. Plan to further address activity tolerance and ADL independence during next session.     Follow Up Recommendations  SNF;Supervision/Assistance - 24 hour    Equipment Recommendations  Tub/shower seat    Recommendations for Other Services       Precautions / Restrictions Precautions Precautions: Fall Precaution Comments: trach, cortrak, ostomy, sacral wounds Restrictions Weight Bearing Restrictions: No      Mobility Bed Mobility Overal bed mobility: Needs Assistance Bed Mobility: Supine to Sit;Sit to Supine     Supine to sit: Min assist;HOB elevated Sit to supine: Min guard   General bed mobility comments: Min A for advancement of B LE. Pt motivated to do what she could on her own  Transfers Overall transfer level: Needs assistance Equipment used: None Transfers: Sit to/from [employer] Sit to Stand: Min assist Stand pivot transfers: Min assist       General transfer comment: Min A with cues for obtaining standing posture and safe sequencing of feet    Balance Overall balance assessment: Needs assistance Sitting-balance support: Feet supported Sitting balance-Leahy Scale: Good     Standing balance support: Single extremity supported;During functional activity Standing balance-Leahy Scale: Poor Standing balance comment: reaching for BSC armrest and bed during transfers                           ADL either performed or assessed with clinical judgement   ADL Overall ADL's : Needs assistance/impaired     Grooming: Set up;Sitting;Wash/dry face;Brushing hair Grooming Details (indicate cue type and reason): Setup to wash face and brush hair sitting EOB             Lower Body Dressing: Moderate assistance;Sit to/from stand Lower Body Dressing Details (indicate cue type and reason): Mod A to don socks with education on bringing feet to self.  Toilet Transfer: Minimal assistance;BSC;Stand-pivot Armed forces technical officer Details (indicate cue type and reason): Min A for BSC transfer without AD. minor cues for posture as pt with tendency to lean far forward when transitioning to standing Toileting- Clothing Manipulation and Hygiene: Minimal assistance Toileting - Clothing Manipulation Details (indicate cue type and reason): Min A to maintain standing balance while performing peri care       General ADL Comments: Pt with limitations in strength, endurance, and dynamic standing balance     Vision   Vision Assessment?: No apparent visual deficits     Perception  Praxis      Pertinent Vitals/Pain Pain Assessment: Faces Faces Pain Scale: Hurts a little bit Pain Location: nose at cortack location Pain Descriptors / Indicators: Sore;Throbbing;Aching Pain Intervention(s): Monitored during session     Hand Dominance     Extremity/Trunk  Assessment Upper Extremity Assessment Upper Extremity Assessment: Generalized weakness   Lower Extremity Assessment Lower Extremity Assessment: Defer to PT evaluation       Communication     Cognition Arousal/Alertness: Awake/alert Behavior During Therapy: WFL for tasks assessed/performed Overall Cognitive Status: Within Functional Limits for tasks assessed                                 General Comments: Pt communicated via PMV and mouthing words   General Comments  Pt reporting mild dizziness sitting EOB with BP readings WFL. SpO2 decreased to 92% after transfer but recovered to 100% within 15 seconds. Educated on energy conservation strategies and provided handout for improved carryover    Exercises     Shoulder Instructions      Home Living                                          Prior Functioning/Environment                   OT Problem List:        OT Treatment/Interventions:      OT Goals(Current goals can be found in the care plan section) Acute Rehab OT Goals Patient Stated Goal: go home OT Goal Formulation: With patient Time For Goal Achievement: 10/09/19 Potential to Achieve Goals: Good ADL Goals Pt Will Perform Grooming: standing;with min assist Pt Will Perform Lower Body Bathing: with supervision;sitting/lateral leans;sit to/from stand Pt Will Perform Lower Body Dressing: sit to/from stand;with adaptive equipment;with min guard assist Pt Will Transfer to Toilet: with min guard assist;stand pivot transfer Pt Will Perform Toileting - Clothing Manipulation and hygiene: with supervision;sit to/from stand;sitting/lateral leans Pt/caregiver will Perform Home Exercise Program: Increased strength;Both right and left upper extremity;With written HEP provided;With theraband;Independently Additional ADL Goal #1: Pt will demonstrate independence with 3 energy conservation strategies during ADL/IADL completion.  OT Frequency:  Min 2X/week   Barriers to D/C:            Co-evaluation              AM-PAC OT "6 Clicks" Daily Activity     Outcome Measure Help from another person eating meals?: Total (NPO) Help from another person taking care of personal grooming?: A Little Help from another person toileting, which includes using toliet, bedpan, or urinal?: A Little Help from another person bathing (including washing, rinsing, drying)?: A Lot Help from another person to put on and taking off regular upper body clothing?: A Little Help from another person to put on and taking off regular lower body clothing?: A Lot 6 Click Score: 14   End of Session Equipment Utilized During Treatment: Gait belt;Oxygen Nurse Communication: Mobility status (blood at trach site)  Activity Tolerance: Patient tolerated treatment well;Patient limited by fatigue Patient left: in bed;with call bell/phone within reach  OT Visit Diagnosis: Muscle weakness (generalized) (M62.81);Unsteadiness on feet (R26.81)                Time: 5916-3846 OT Time Calculation (min): 51 min Charges:  OT General Charges $OT Visit: 1 Visit OT Treatments $Self Care/Home Management : 23-37 mins $Therapeutic Activity: 8-22 mins  Layla Maw, OTR/L  Layla Maw 09/25/2019, 10:34 AM

## 2019-09-26 ENCOUNTER — Inpatient Hospital Stay (HOSPITAL_COMMUNITY): Payer: Medicare HMO

## 2019-09-26 LAB — CBC WITH DIFFERENTIAL/PLATELET
Abs Immature Granulocytes: 0.15 10*3/uL — ABNORMAL HIGH (ref 0.00–0.07)
Basophils Absolute: 0 10*3/uL (ref 0.0–0.1)
Basophils Relative: 0 %
Eosinophils Absolute: 0 10*3/uL (ref 0.0–0.5)
Eosinophils Relative: 0 %
HCT: 32.4 % — ABNORMAL LOW (ref 36.0–46.0)
Hemoglobin: 9.2 g/dL — ABNORMAL LOW (ref 12.0–15.0)
Immature Granulocytes: 1 %
Lymphocytes Relative: 10 %
Lymphs Abs: 1.2 10*3/uL (ref 0.7–4.0)
MCH: 26.7 pg (ref 26.0–34.0)
MCHC: 28.4 g/dL — ABNORMAL LOW (ref 30.0–36.0)
MCV: 93.9 fL (ref 80.0–100.0)
Monocytes Absolute: 1.2 10*3/uL — ABNORMAL HIGH (ref 0.1–1.0)
Monocytes Relative: 10 %
Neutro Abs: 9.5 10*3/uL — ABNORMAL HIGH (ref 1.7–7.7)
Neutrophils Relative %: 79 %
Platelets: 383 10*3/uL (ref 150–400)
RBC: 3.45 MIL/uL — ABNORMAL LOW (ref 3.87–5.11)
RDW: 19.3 % — ABNORMAL HIGH (ref 11.5–15.5)
WBC: 12.1 10*3/uL — ABNORMAL HIGH (ref 4.0–10.5)
nRBC: 0 % (ref 0.0–0.2)

## 2019-09-26 LAB — GLUCOSE, CAPILLARY
Glucose-Capillary: 111 mg/dL — ABNORMAL HIGH (ref 70–99)
Glucose-Capillary: 121 mg/dL — ABNORMAL HIGH (ref 70–99)
Glucose-Capillary: 122 mg/dL — ABNORMAL HIGH (ref 70–99)
Glucose-Capillary: 123 mg/dL — ABNORMAL HIGH (ref 70–99)
Glucose-Capillary: 127 mg/dL — ABNORMAL HIGH (ref 70–99)
Glucose-Capillary: 129 mg/dL — ABNORMAL HIGH (ref 70–99)
Glucose-Capillary: 265 mg/dL — ABNORMAL HIGH (ref 70–99)

## 2019-09-26 LAB — COMPREHENSIVE METABOLIC PANEL
ALT: 8 U/L (ref 0–44)
AST: 18 U/L (ref 15–41)
Albumin: 2.5 g/dL — ABNORMAL LOW (ref 3.5–5.0)
Alkaline Phosphatase: 77 U/L (ref 38–126)
Anion gap: 11 (ref 5–15)
BUN: 32 mg/dL — ABNORMAL HIGH (ref 8–23)
CO2: 28 mmol/L (ref 22–32)
Calcium: 9.6 mg/dL (ref 8.9–10.3)
Chloride: 100 mmol/L (ref 98–111)
Creatinine, Ser: 0.85 mg/dL (ref 0.44–1.00)
GFR calc Af Amer: >60 mL/min (ref >60)
GFR calc non Af Amer: >60 mL/min (ref >60)
Glucose, Bld: 133 mg/dL — ABNORMAL HIGH (ref 70–99)
Potassium: 4.2 mmol/L (ref 3.5–5.1)
Sodium: 139 mmol/L (ref 135–145)
Total Bilirubin: 0.4 mg/dL (ref 0.3–1.2)
Total Protein: 6.6 g/dL (ref 6.5–8.1)

## 2019-09-26 LAB — POCT I-STAT 7, (LYTES, BLD GAS, ICA,H+H)
Acid-Base Excess: 3 mmol/L — ABNORMAL HIGH (ref 0.0–2.0)
Bicarbonate: 29.7 mmol/L — ABNORMAL HIGH (ref 20.0–28.0)
Calcium, Ion: 1.36 mmol/L (ref 1.15–1.40)
HCT: 34 % — ABNORMAL LOW (ref 36.0–46.0)
Hemoglobin: 11.6 g/dL — ABNORMAL LOW (ref 12.0–15.0)
O2 Saturation: 92 %
Patient temperature: 99.5
Potassium: 3.8 mmol/L (ref 3.5–5.1)
Sodium: 140 mmol/L (ref 135–145)
TCO2: 31 mmol/L (ref 22–32)
pCO2 arterial: 52.4 mmHg — ABNORMAL HIGH (ref 32.0–48.0)
pH, Arterial: 7.363 (ref 7.350–7.450)
pO2, Arterial: 68 mmHg — ABNORMAL LOW (ref 83.0–108.0)

## 2019-09-26 MED ORDER — SCOPOLAMINE 1 MG/3DAYS TD PT72
1.0000 | MEDICATED_PATCH | TRANSDERMAL | Status: DC
  Filled 2019-09-26: qty 1

## 2019-09-26 MED ORDER — QUETIAPINE FUMARATE 25 MG PO TABS
12.5000 mg | ORAL_TABLET | Freq: Two times a day (BID) | ORAL | Status: DC
  Administered 2019-09-26 – 2019-10-02 (×13): 12.5 mg
  Filled 2019-09-26 (×13): qty 1

## 2019-09-26 MED ORDER — OXYCODONE HCL 5 MG PO TABS
10.0000 mg | ORAL_TABLET | Freq: Three times a day (TID) | ORAL | Status: DC
  Administered 2019-09-26 – 2019-09-30 (×12): 10 mg
  Filled 2019-09-26 (×13): qty 2

## 2019-09-26 MED ORDER — REVEFENACIN 175 MCG/3ML IN SOLN
175.0000 ug | Freq: Every day | RESPIRATORY_TRACT | Status: DC
  Administered 2019-09-26 – 2019-10-04 (×9): 175 ug via RESPIRATORY_TRACT
  Filled 2019-09-26 (×10): qty 3

## 2019-09-26 MED ORDER — SODIUM CHLORIDE 3 % IN NEBU
4.0000 mL | INHALATION_SOLUTION | Freq: Every day | RESPIRATORY_TRACT | Status: AC
  Administered 2019-09-26: 4 mL via RESPIRATORY_TRACT
  Filled 2019-09-26: qty 4

## 2019-09-26 MED ORDER — ARFORMOTEROL TARTRATE 15 MCG/2ML IN NEBU
15.0000 ug | INHALATION_SOLUTION | Freq: Two times a day (BID) | RESPIRATORY_TRACT | Status: DC
  Administered 2019-09-26 – 2019-10-04 (×17): 15 ug via RESPIRATORY_TRACT
  Filled 2019-09-26 (×18): qty 2

## 2019-09-26 MED ORDER — FENTANYL CITRATE (PF) 100 MCG/2ML IJ SOLN
INTRAMUSCULAR | Status: AC
  Filled 2019-09-26: qty 2

## 2019-09-26 NOTE — Progress Notes (Signed)
Nutrition Follow-up  DOCUMENTATION CODES:   Not applicable  INTERVENTION:                  Tube FeedingviaCortrak: Vital 1.5 at 60 ml/hr Change to Pro-Source TF 45 mL TID Provides 130  g of protein, 2280 kcals and 1094 mL of free water Meets 100% estimated calorie and protein needs  Continue Vit C and zinc   NUTRITION DIAGNOSIS:   Inadequate oral intake related to acute illness as evidenced by NPO status.  Being addressed via TF   GOAL:   Patient will meet greater than or equal to 90% of their needs  Progressing   MONITOR:   Vent status, Diet advancement, Labs, Weight trends, Skin  REASON FOR ASSESSMENT:   Consult, Ventilator Enteral/tube feeding initiation and management  ASSESSMENT:   64 yo female admitted with acute on chronic respiratory failure from HCAP requiring intubation PMH includes NSCLS s/p partial resection, anal cancer s/p colostomy, acute on chronic respiratory failure requiring repeat intubations, CHF, COPD, CAD, MI  6/29 Admitted, Intubated 7/06 Lurline Idol, Cortrak placed 7/18 Transferred out of ICU, tolerating TC x 24 hours 7/19 Transferred back to ICU for vent support  Pt currently on vent support via trach. Per SLP note, plan was for MBS today but given decline in status, MBS on hold. Pt remains NPO with Cortrak in place for feeding  Tolerating Vital 1.5 at 60 ml/hr, Pro-Source TF 45 mL TID via Cortrak  Current weight 82.1 kg today; previous weight of 79.5 kg on 7/14. Admission weight 82 kg.   +stool via colostomy  Labs: reviewed Meds:  ss novolog, B-12, Vit D, zinc sulfate, Vit C  Diet Order:   Diet Order            Diet NPO time specified  Diet effective midnight                 EDUCATION NEEDS:   Not appropriate for education at this time  Skin:  Skin Assessment: Skin Integrity Issues: Skin Integrity Issues:: Unstageable Stage III: sacrum Unstageable: sacrum  Last BM:  7/20 colostomy  Height:   Ht Readings from  Last 1 Encounters:  09/05/19 5\' 9"  (1.753 m)    Weight:   Wt Readings from Last 1 Encounters:  09/25/19 82.1 kg    BMI:  Body mass index is 26.73 kg/m.  Estimated Nutritional Needs:   Kcal:  2000-2300 kcals  Protein:  120-140 g  Fluid:  >/= 2 L   Kerman Passey MS, RDN, LDN, CNSC Registered Dietitian III Clinical Nutrition RD Pager and On-Call Pager Number Located in [place]

## 2019-09-26 NOTE — Consult Note (Signed)
[place] Nurse wound follow up Wound type:unstageable pressure injury to coccyx, weekly assessment.  Measurement: 2cm x 3 cm  Wound bed:80% slough and 20% pink granulation Drainage (amount, consistency, odor) minimal serosanguinous  No odor.  Periwound:intact Dressing procedure/placement/frequency: Continue santyl as ordered.  Albumin is 2.5, delayed healing is likely.  Continue to offload pressure and encourage PO intake.  Will assess weekly.  Domenic Moras MSN, RN, FNP-BC CWON Wound, Ostomy, Continence Nurse Pager [phone]

## 2019-09-26 NOTE — Progress Notes (Deleted)
Pts QTc is 0.49.  Spoke w/ pharmacy who states pt not receiving any meds that would contribute.  Relayed to CCM.

## 2019-09-26 NOTE — Significant Event (Signed)
HOSPITAL MEDICINE OVERNIGHT EVENT NOTE  Notified by nursing that patient acutely desaturated into the 40s with rapidly worsening lethargy while on trach collar.  I happen to already be on the medical floor seeing another patient.  Immediately went to the bedside where attempts were made to perform vigorous flexible suction in case of sudden mucous plugging.  This was unsuccessful in relieving any mucous plug.  Due to patient's extremely poor respiratory effort, attempted to start bagging patient which did eventually result improvement in oxygen saturations.  Patient's mentation did not recover however and therefore ABG was performed.  ABG while bagging the patient revealed pH 7.1, PCO2 102, PO2 81.7.  Based on this ABG I requested immediate placement of patient back on mechanical ventilation.  Case was discussed with CCM who promptly came to the bedside to evaluate the patient.  Because of delays in initiating mechanical ventilation, patient was bagged in route to being transferred to the intensive care unit.  Their assistance is much appreciated.  Sherryll Burger Emilynn Srinivasan

## 2019-09-26 NOTE — Progress Notes (Addendum)
Pt alert and oriented, following commands, on TC. Per CCM, ok to give scheduled anxiety and pain meds.

## 2019-09-26 NOTE — Progress Notes (Addendum)
  Speech Language Pathology Treatment: Dysphagia  Patient Details Name: Chelsea Allison MRN: [id number] DOB: [birth year] Today's Date: 09/26/2019 Time: 2947-6546 SLP Time Calculation (min) (ACUTE ONLY): 10 min  Assessment / Plan / Recommendation Clinical Impression  Pt was to be seen for MBS this morning, however, RN reports decline in status requiring return to vent support. Pt requesting water, however, recommend NPO at this time until respiratory status improves. Pt was educated regarding recommendation and hold on objective swallow evaluation, and was encouraged that nursing could moisten her mouth with swabs in the interim. Instrumental study will be rescheduled when appropriate. Radiology notified, orders for MBS discontinued at this time. ST will follow.    HPI HPI: 64 yo f with a PMHx of CAD/STEMI in Oct. 2020 (s/p PCI), COPD on home O2, NSCLC s/p partial lung resection, colon ca, CHF presented 6/3 with shortness of breath and elevated troponin. Negative cardiac cath and treated for acute COPD exacerbation with improvement. 6/5 respiratory distress; admitted to ICU, intubated 6/5-6/6. Hypercarbic resp failure with reintubation 6/7-6/8. Reintubated 6/8 due to acidosis and resp distress, extubated 6/12. Bronch 6/10 revealed significant airway distortion and stenosis of the R airways likely due to fibrosis s/p RUL lobectomy and radiation. 7/6 trached. Planned MBS 09/26/19, however, pt placed back on vent.      SLP Plan  Other (Comment) (objective assessment when appropriate)       Recommendations  Diet recommendations: NPO Medication Administration: Via alternative means                SLP Visit Diagnosis: Dysphagia, unspecified (R13.10) Plan: Other (Comment) (objective assessment when appropriate)       GO               Yolandra Habig B. Quentin Ore, [facility], [place] Speech Language Pathologist Office: [phone] Pager: [phone]  Shonna Chock 09/26/2019, 8:54 AM

## 2019-09-26 NOTE — Progress Notes (Signed)
eLink Physician-Brief Progress Note Patient Name: Chelsea Allison DOB: December 08, 1955 MRN: [id number]   Date of Service  09/26/2019  HPI/Events of Note  Nursing request for CBC with platelets and CMP now.   eICU Interventions  Will order CBC with platelets and CMP now.      Intervention Category Major Interventions: Other:  Dudley Cooley Cornelia Copa 09/26/2019, 5:05 AM

## 2019-09-26 NOTE — Progress Notes (Signed)
[facility] Physician-Brief Progress Note Patient Name: Chelsea Allison DOB: May 30, 1955 MRN: [id number]   Date of Service  09/26/2019  HPI/Events of Note  Anisocoria, L pupill 1 mm larger than R pupil. Both reactive to light. Patient is awake, follows commands and moves all extremities.    eICU Interventions  Will hold off on Head CT Scan w/o contrast for the present time and follow serial neurological exam.      Intervention Category Major Interventions: Other:  Kimball Manske Cornelia Copa 09/26/2019, 1:18 AM

## 2019-09-26 NOTE — Progress Notes (Signed)
NAME:  Chelsea Allison, MRN:  [id number], DOB:  September 13, 1955, LOS: 21 ADMISSION DATE:  09/05/2019, CONSULTATION DATE:  09/05/19 REFERRING MD:  Stark Jock- EM, CHIEF COMPLAINT:  Respiratory Distress, AMS  Brief History   64 yo female smoker presented to ED with altered mental status from acute on chronic hypoxic/hypercapnic respiratory failure 2nd to HCAP in setting of COPD.  Past Medical History  NSCLS s/p partial resection Anal cancer s/p colostomy CAD with MI s/p PCI COPD on home O2 HFrEF Tobacco abuse  Acute on chronic respiratory failure requiring intubation, repeated   Significant Hospital Events   6/29 admitted to [facility]  7/1 hgb drop.  7/2 SBTx4h 7/4 only tolerated 34min on pressure support. Starting precedex/klonapin. Obtaining hemolytic labs. 7/6 trached 7/9: Copious secretions still noted.  Tachypneic.  Mildly hypotensive.  Presuming anxiety complicating weaning efforts titration of anxiolytic regimen performed, starting aerosol trach collar trials 7/10: Was on trach collar, pulse oximetry dropping to 40s supplemental oxygen increase but still on the needed achieve 70% FiO2 placed back on ventilator lavaged for pink-tan secretions saturations improved back on vent, checking sputum culture and chest x-ray. 7/11 looks better. Working on weaning  7/19 transfer back to ICU after severe desaturations and also hypercarbic failure.   Procedures:  ETT 6/29-7/6 Tracheostomy 7/6  Significant Diagnostic Tests:  CXR 7/2: appropriate positioning of ETT, NG tubes, no acute overlying lung processes noted  CXR 7/6: new patchy left lung base opacity which could represent atelectasis vs aspiration vs pna, tracheostomy in place  Micro Data:  6/29 SARS Cov2>negative 6/29 tracheal aspirate>  6/29 UCx> less than 10,000 colonies insignificant growth 6/29 BCx> NG 6/29 MRSA PCR>negative 7/10: Sputum culture: E. coli, Klebsiella  Antimicrobials:  6/29 vancomycin>6/30 6/29 pip/tazo> 7/3 Ancef  7/12 >>   Interim history/subjective:  Suspect plugging as well as oversedation / hypercarbia contributed to respiratory failure Currently stable on CPAP/PS . 10/5 Anxious, but totally alert and appropriate Writing notes. States she cannot cough up secretions.  They are too thick CXR with  Left infiltrate Low grade temp 99.7 with WBC of 7.1 CO2/ pH  have corrected with ventilation Net negative 824  Objective   Blood pressure (!) 84/52, pulse 93, temperature 98.3 F (36.8 C), temperature source Oral, resp. rate (!) 25, height 5\' 9"  (1.753 m), weight 82.1 kg, SpO2 95 %.    Vent Mode: CPAP;PSV FiO2 (%):  [28 %-98 %] 30 % Set Rate:  [24 bmp-26 bmp] 24 bmp Vt Set:  [530 mL] 530 mL PEEP:  [5 cmH20] 5 cmH20 Pressure Support:  [10 cmH20] 10 cmH20 Plateau Pressure:  [22 cmH20-34 cmH20] 22 cmH20   Intake/Output Summary (Last 24 hours) at 09/26/2019 0906 Last data filed at 09/26/2019 0800 Gross per 24 hour  Intake 1138.47 ml  Output 1125 ml  Net 13.47 ml   Filed Weights   09/19/19 0600 09/20/19 0500 09/25/19 0122  Weight: 79.9 kg 79.5 kg 82.1 kg    Examination: General: chronically ill appearing elderly female.  HEENT: 6 cuffed Shiley trach midline, PERRL, no appreciable JVD, No LAD Neuro: Awake and alert, writing notes CV: tachy, regular, no MRG PULM: Rhonchi L, decreased breath sounds R., Insp and Exp wheezing throughout GI: soft, hypoactive, non-distended, NT Extremities: warm/dry, no edema, no obvious defortmities  Skin: no rashes or lesions, warm and intact  Assessment & Plan:   Acute on chronic hypercapnic hypoxic respiratory failure post COVID pneumonia and now s/p antibiotic course for for E.coli/Klebsiella PNA. S/p tracheostomy 7/6  for inability to wean off vent and repeated intubations COPD s/p acute exacerbation Transferred back to ICU 7/19 after mucus plugging event PH with CO2 of 102 CXR with L infiltrate Plan - Routine trach care - Trend ABG prn - BD as  ordered - Will add 3% saline nebs x 3 days to thin secretions - Will add chest PT - PT/OT  - Wean vent as tolerated/ CPAP/ ATC as able - may need LTAC  - CXR 7/21 to trend infiltrate - Sputum Culture  Chronic cancer related pain Anxiety  Current regimen: Seroquel 25 BID, buprenorphine patch, Klonopin at bedtime, and Oxy 10 mg q6 - mental status has recovered, will decrease Seroquel to 12.5 BID - Will decrease oxy to Q 8 - Will need to consider  additional down titration of sedation if remains too sedated - Supportive care  Chronic HFrEF w/ h/o CAD  EF 35-40% on 08/12/2019 - Continue telemetry  - Continue aspirin Brilinta and Daily assessment for diuresis   Iron deficient Anemia.  Stable and Hgb trending up. No obvious source of bleeding. - Trend CBC intermittently and transfuse for hemoglobin less than 7  DM type 2 - Blood sugars stable - Plan - Sliding scale insulin  Hx NSCLC s/p partial resection XLT. Hx anal cancer s/p colectomy. Plan - Outpatient follow-up at some point  High risk malnutrition.  Plan - Tube feeds  Best practice:  Diet: tube feeds DVT prophylaxis: lovenox GI prophylaxis: protonix Mobility: PT/ Code Status: Full  Disposition:  Transfer to ICU, will likely dispo to [facility] Family update: updated patient on today's plan   Signature:   Magdalen Spatz, MSN, AGACNP-BC [place] for personal pager PCCM on call pager [phone] [place] for personal pager PCCM on call pager [phone]  09/26/2019 9:06 AM

## 2019-09-26 NOTE — Progress Notes (Signed)
Physical Therapy Treatment Patient Details Name: Chelsea Allison MRN: [id number] DOB: [birth year] Today's Date: 09/26/2019    History of Present Illness 64 yo female admitted from home 6/29 with respiratory distress and hypoxia.  Intubated in ED. Trach 7/6. pt with respiratory distress and return to vent 7/15. Was in hospital from 6/03 to 6/18 for COPD exacerbation, acute on chronic systolic CHF with NSTEMI requiring intubation who D/Cd to rehab then home 6/22.  PMHx: anal CA s/p colostomy, NSCLC s/p partial resection, CAD, CHF    PT Comments    Pt reluctant to participate, but agreed with encouragement and need for wash up and dressing change.  Emphasis on transitions, sit to stand and standing tolerance.  VSS on CPAP remained stable    Follow Up Recommendations  SNF;Supervision/Assistance - 24 hour     Equipment Recommendations  None recommended by PT    Recommendations for Other Services       Precautions / Restrictions      Mobility  Bed Mobility Overal bed mobility: Needs Assistance Bed Mobility: Supine to Sit;Sit to Supine     Supine to sit: Min assist;HOB elevated Sit to supine: Min guard   General bed mobility comments: effortful, but no assistance needed  Transfers Overall transfer level: Needs assistance Equipment used: None Transfers: Sit to/from Stand Sit to Stand: Min assist         General transfer comment: face to face assist  with minimal boost assist  Ambulation/Gait             General Gait Details: side stepped up toward HOB approx 3 feet with moderate w/shift and stability assist.   Stairs             Wheelchair Mobility    Modified Rankin (Stroke Patients Only)       Balance Overall balance assessment: Needs assistance   Sitting balance-Leahy Scale: Good     Standing balance support: Single extremity supported;Bilateral upper extremity supported Standing balance-Leahy Scale: Poor Standing balance comment: stood at EOB  x>5 min while bed changed, pt washed on the back side, pt's dressing was changed all with minimal assist                            Cognition Arousal/Alertness: Awake/alert Behavior During Therapy: WFL for tasks assessed/performed Overall Cognitive Status: Within Functional Limits for tasks assessed                                        Exercises Other Exercises Other Exercises: warm up LE ROM exercise    General Comments General comments (skin integrity, edema, etc.): sats on 30% FiO2, 5 PeeP, CPAP at 97%, RR 30's, and HR up to 118 bpm (likely anxiety)      Pertinent Vitals/Pain Pain Assessment: Faces Faces Pain Scale: Hurts little more Pain Location: teeth Pain Descriptors / Indicators: Throbbing;Sore Pain Intervention(s): Monitored during session    Home Living                      Prior Function            PT Goals (current goals can now be found in the care plan section) Acute Rehab PT Goals Patient Stated Goal: go home PT Goal Formulation: With patient Time For Goal Achievement: 10/10/19 Potential to Achieve Goals: Good Progress towards  PT goals: Progressing toward goals (continue with goals as of 7/20)    Frequency    Min 3X/week      PT Plan Current plan remains appropriate    Co-evaluation              AM-PAC PT "6 Clicks" Mobility   Outcome Measure  Help needed turning from your back to your side while in a flat bed without using bedrails?: A Little Help needed moving from lying on your back to sitting on the side of a flat bed without using bedrails?: A Little Help needed moving to and from a bed to a chair (including a wheelchair)?: A Lot Help needed standing up from a chair using your arms (e.g., wheelchair or bedside chair)?: A Little Help needed to walk in hospital room?: Total Help needed climbing 3-5 steps with a railing? : Total 6 Click Score: 13    End of Session   Activity Tolerance: Other  (comment);Patient limited by fatigue;Patient tolerated treatment well (hindered by anxiety) Patient left: in bed;with call bell/phone within reach;with bed alarm set Nurse Communication: Mobility status;Precautions PT Visit Diagnosis: Muscle weakness (generalized) (M62.81);Other abnormalities of gait and mobility (R26.89)     Time: 1710-1737 PT Time Calculation (min) (ACUTE ONLY): 27 min  Charges:  $Therapeutic Activity: 23-37 mins                     09/26/2019  Chelsea Carne., PT Acute Rehabilitation Services [phone]  (pager) [phone]  (office)   Chelsea Allison 09/26/2019, 5:52 PM

## 2019-09-27 ENCOUNTER — Inpatient Hospital Stay (HOSPITAL_COMMUNITY): Payer: Medicare HMO

## 2019-09-27 DIAGNOSIS — J9621 Acute and chronic respiratory failure with hypoxia: Secondary | ICD-10-CM | POA: Diagnosis not present

## 2019-09-27 DIAGNOSIS — J9622 Acute and chronic respiratory failure with hypercapnia: Secondary | ICD-10-CM | POA: Diagnosis not present

## 2019-09-27 LAB — CBC
HCT: 32.1 % — ABNORMAL LOW (ref 36.0–46.0)
Hemoglobin: 8.9 g/dL — ABNORMAL LOW (ref 12.0–15.0)
MCH: 26.4 pg (ref 26.0–34.0)
MCHC: 27.7 g/dL — ABNORMAL LOW (ref 30.0–36.0)
MCV: 95.3 fL (ref 80.0–100.0)
Platelets: 332 10*3/uL (ref 150–400)
RBC: 3.37 MIL/uL — ABNORMAL LOW (ref 3.87–5.11)
RDW: 19.8 % — ABNORMAL HIGH (ref 11.5–15.5)
WBC: 8 10*3/uL (ref 4.0–10.5)
nRBC: 0 % (ref 0.0–0.2)

## 2019-09-27 LAB — BASIC METABOLIC PANEL
Anion gap: 9 (ref 5–15)
BUN: 31 mg/dL — ABNORMAL HIGH (ref 8–23)
CO2: 31 mmol/L (ref 22–32)
Calcium: 10 mg/dL (ref 8.9–10.3)
Chloride: 100 mmol/L (ref 98–111)
Creatinine, Ser: 0.56 mg/dL (ref 0.44–1.00)
GFR calc Af Amer: >60 mL/min (ref >60)
GFR calc non Af Amer: >60 mL/min (ref >60)
Glucose, Bld: 149 mg/dL — ABNORMAL HIGH (ref 70–99)
Potassium: 4.4 mmol/L (ref 3.5–5.1)
Sodium: 140 mmol/L (ref 135–145)

## 2019-09-27 LAB — GLUCOSE, CAPILLARY
Glucose-Capillary: 102 mg/dL — ABNORMAL HIGH (ref 70–99)
Glucose-Capillary: 120 mg/dL — ABNORMAL HIGH (ref 70–99)
Glucose-Capillary: 122 mg/dL — ABNORMAL HIGH (ref 70–99)
Glucose-Capillary: 123 mg/dL — ABNORMAL HIGH (ref 70–99)
Glucose-Capillary: 124 mg/dL — ABNORMAL HIGH (ref 70–99)
Glucose-Capillary: 137 mg/dL — ABNORMAL HIGH (ref 70–99)

## 2019-09-27 NOTE — Progress Notes (Signed)
Bed not working at this time for CPT.

## 2019-09-27 NOTE — Progress Notes (Signed)
NAME:  Chelsea Allison, MRN:  [id number], DOB:  [birth year], LOS: 71 ADMISSION DATE:  09/05/2019, CONSULTATION DATE:  09/05/19 REFERRING MD:  Stark Jock- EM, CHIEF COMPLAINT:  Respiratory Distress, AMS  Brief History   64 yo female smoker presented to ED with altered mental status from acute on chronic hypoxic/hypercapnic respiratory failure 2nd to HCAP in setting of COPD.  Past Medical History  NSCLS s/p partial resection Anal cancer s/p colostomy CAD with MI s/p PCI COPD on home O2 HFrEF Tobacco abuse  Acute on chronic respiratory failure requiring intubation, repeated   Significant Hospital Events   6/29 admitted to [facility]  7/1 hgb drop.  7/2 SBTx4h 7/4 only tolerated 75min on pressure support. Starting precedex/klonapin. Obtaining hemolytic labs. 7/6 trached 7/9: Copious secretions still noted.  Tachypneic.  Mildly hypotensive.  Presuming anxiety complicating weaning efforts titration of anxiolytic regimen performed, starting aerosol trach collar trials 7/10: Was on trach collar, pulse oximetry dropping to 40s supplemental oxygen increase but still on the needed achieve 70% FiO2 placed back on ventilator lavaged for pink-tan secretions saturations improved back on vent, checking sputum culture and chest x-ray. 7/11 looks better. Working on weaning  7/19 transfer back to ICU after severe desaturations and also hypercarbic failure.  7/21 no overnight events  Procedures:  ETT 6/29-7/6 Tracheostomy 7/6  Significant Diagnostic Tests:  CXR 7/2: appropriate positioning of ETT, NG tubes, no acute overlying lung processes noted  CXR 7/6: new patchy left lung base opacity which could represent atelectasis vs aspiration vs pna, tracheostomy in place  Micro Data:  6/29 SARS Cov2>negative 6/29 tracheal aspirate>  6/29 UCx> less than 10,000 colonies insignificant growth 6/29 BCx> NG 6/29 MRSA PCR>negative 7/10: Sputum culture: E. coli, Klebsiella  Antimicrobials:  6/29  vancomycin>6/30 6/29 pip/tazo> 7/3 Ancef 7/12 -7/18  Interim history/subjective:  Suspect plugging as well as oversedation / hypercarbia contributed to respiratory failure Currently stable on CPAP/PS . 10/5 Anxious, but totally alert and appropriate No overnight events reported  Objective   Blood pressure 93/61, pulse 91, temperature 98.1 F (36.7 C), temperature source Oral, resp. rate (!) 23, height 5\' 9"  (1.753 m), weight 82.7 kg, SpO2 98 %.    Vent Mode: PSV;CPAP FiO2 (%):  [30 %] 30 % PEEP:  [5 cmH20] 5 cmH20 Pressure Support:  [10 cmH20] 10 cmH20 Plateau Pressure:  [18 cmH20-26 cmH20] 23 cmH20   Intake/Output Summary (Last 24 hours) at 09/27/2019 0828 Last data filed at 09/27/2019 0600 Gross per 24 hour  Intake 1795 ml  Output 805 ml  Net 990 ml   Filed Weights   09/20/19 0500 09/25/19 0122 09/27/19 0600  Weight: 79.5 kg 82.1 kg 82.7 kg    Examination: General: chronically ill appearing elderly female.  Comfortable on vent, no increased work of breathing HEENT: 6 cuffed Shiley trach midline, PERRL, no appreciable JVD, No LAD Neuro: Awake and alert, writing notes CV: S1-S2 appreciated with no murmur PULM: Bilateral rhonchi noted, decreased at the bases  GI: soft, hypoactive, non-distended, NT Extremities: warm/dry, no edema, no obvious defortmities  Skin: no rashes or lesions, warm and intact  Assessment & Plan:   Acute on chronic hypercapnic hypoxic respiratory failure post COVID pneumonia and now s/p antibiotic course for for E.coli/Klebsiella PNA. S/p tracheostomy 7/6 for inability to wean off vent and repeated intubations  -Currently tolerating pressure support of 10, PEEP of 5 -Patient may be tried on trach collar  COPD s/p acute exacerbation Transferred back to ICU 7/19 after mucus plugging event  PH with CO2 of 102-improved, down to 52 CXR with L infiltrate-repeat chest x-ray pending Plan - Routine trach care -Continue bronchodilators -Continue 3%  saline nebs x 3 days to thin secretions -Continue chest PT - PT/OT  - Wean vent as tolerated/ CPAP/ ATC as able - CXR 7/21 to trend infiltrate-this is pending - Sputum Culture  Chronic cancer related pain Anxiety  Current regimen: Seroquel 25 BID, buprenorphine patch, Klonopin at bedtime, and Oxy 10 mg q6 - mental status has recovered, decreased Seroquel to 12.5 BID-continue this at present -Oxycodone decreased to every 8 - Will need to consider  additional down titration of sedation if remains too sedated - Supportive care  Chronic HFrEF w/ h/o CAD  EF 35-40% on 08/12/2019 - Continue telemetry  - Continue aspirin Brilinta and Daily assessment for diuresis   Iron deficient Anemia.  Stable and Hgb trending up. No obvious source of bleeding. - Trend CBC intermittently and transfuse for hemoglobin less than 7  DM type 2 - Blood sugars stable - Plan - Sliding scale insulin  Hx NSCLC s/p partial resection XLT. Hx anal cancer s/p colectomy. Plan - Outpatient follow-up at some point  High risk malnutrition.  Plan - Tube feeds  Best practice:  Diet: tube feeds DVT prophylaxis: lovenox GI prophylaxis: protonix Mobility: PT/ Code Status: Full  Disposition:  Transfer to ICU, will likely dispo to [facility] Family update: updated patient on today's plan  The patient is critically ill with multiple organ systems failure and requires high complexity decision making for assessment and support, frequent evaluation and titration of therapies, application of advanced monitoring technologies and extensive interpretation of multiple databases. Critical Care Time devoted to patient care services described in this note independent of APP/resident time (if applicable)  is 32 minutes.   Sherrilyn Rist MD [place] [facility] Personal pager: [phone] If unanswered, please page CCM On-call: [phone]

## 2019-09-27 NOTE — Progress Notes (Signed)
Physical Therapy Treatment Patient Details Name: Chelsea Allison MRN: [id number] DOB: 1955/05/30 Today's Date: 09/27/2019    History of Present Illness 64 yo female admitted from home 6/29 with respiratory distress and hypoxia.  Intubated in ED. Trach 7/6. pt with respiratory distress and return to vent 7/15. Was in hospital from 6/03 to 6/18 for COPD exacerbation, acute on chronic systolic CHF with NSTEMI requiring intubation who D/Cd to rehab then home 6/22.  PMHx: anal CA s/p colostomy, NSCLC s/p partial resection, CAD, CHF    PT Comments    Pt less reluctant today.  Agreed to get up in the chair, but not get further.  Suspect anxiety is the limiting factor.  Emphasis on mobility without assist, reinforcing that all vital are stable on humidified air.  Sit to stand and transfer.  Asked pt to prepare for gait in the halls next session.    Follow Up Recommendations  SNF;Supervision/Assistance - 24 hour     Equipment Recommendations  None recommended by PT    Recommendations for Other Services       Precautions / Restrictions Precautions Precautions: Fall Precaution Comments: trach, cortrak, ostomy, sacral wounds    Mobility  Bed Mobility Overal bed mobility: Needs Assistance Bed Mobility: Supine to Sit     Supine to sit: Min guard Sit to supine: Min guard   General bed mobility comments: less effortful, no assist need, scooted asymmetrically to EOB  Transfers Overall transfer level: Needs assistance Equipment used: None Transfers: Sit to/from [facility] Sit to Stand: Min assist Stand pivot transfers: Min assist       General transfer comment: pivoted over a 5 foot distance with minimal stability assist.   Ambulation/Gait                 Stairs             Wheelchair Mobility    Modified Rankin (Stroke Patients Only)       Balance   Sitting-balance support: Feet supported Sitting balance-Leahy Scale: Good       Standing  balance-Leahy Scale: Poor Standing balance comment: reliant on external stability                            Cognition Arousal/Alertness: Awake/alert Behavior During Therapy: WFL for tasks assessed/performed Overall Cognitive Status: Within Functional Limits for tasks assessed                                        Exercises      General Comments General comments (skin integrity, edema, etc.): vss, mid to upper 90's for sats on 28% TC      Pertinent Vitals/Pain Pain Assessment: Faces Faces Pain Scale: Hurts a little bit Pain Location: teeth Pain Descriptors / Indicators: Throbbing;Sore Pain Intervention(s): Monitored during session    Home Living                      Prior Function            PT Goals (current goals can now be found in the care plan section) Acute Rehab PT Goals PT Goal Formulation: With patient Time For Goal Achievement: 10/10/19 Potential to Achieve Goals: Good Progress towards PT goals: Progressing toward goals    Frequency    Min 3X/week      PT Plan  Current plan remains appropriate    Co-evaluation              AM-PAC PT "6 Clicks" Mobility   Outcome Measure  Help needed turning from your back to your side while in a flat bed without using bedrails?: A Little Help needed moving from lying on your back to sitting on the side of a flat bed without using bedrails?: A Little Help needed moving to and from a bed to a chair (including a wheelchair)?: A Little Help needed standing up from a chair using your arms (e.g., wheelchair or bedside chair)?: A Little Help needed to walk in hospital room?: A Lot Help needed climbing 3-5 steps with a railing? : A Lot 6 Click Score: 16    End of Session   Activity Tolerance: Patient tolerated treatment well Patient left: in chair;with call bell/phone within reach;with chair alarm set Nurse Communication: Mobility status;Precautions PT Visit Diagnosis:  Muscle weakness (generalized) (M62.81);Other abnormalities of gait and mobility (R26.89);Difficulty in walking, not elsewhere classified (R26.2)     Time: 1125-1202 PT Time Calculation (min) (ACUTE ONLY): 37 min  Charges:  $Therapeutic Activity: 23-37 mins                     09/27/2019  [name], PT Acute Rehabilitation Services [phone]  (pager) [phone]  (office)   Tessie Fass Kerry-Anne Mezo 09/27/2019, 2:32 PM

## 2019-09-28 LAB — GLUCOSE, CAPILLARY
Glucose-Capillary: 112 mg/dL — ABNORMAL HIGH (ref 70–99)
Glucose-Capillary: 122 mg/dL — ABNORMAL HIGH (ref 70–99)
Glucose-Capillary: 132 mg/dL — ABNORMAL HIGH (ref 70–99)
Glucose-Capillary: 134 mg/dL — ABNORMAL HIGH (ref 70–99)
Glucose-Capillary: 136 mg/dL — ABNORMAL HIGH (ref 70–99)
Glucose-Capillary: 81 mg/dL (ref 70–99)

## 2019-09-28 MED ORDER — OXYCODONE HCL 5 MG PO TABS
10.0000 mg | ORAL_TABLET | Freq: Once | ORAL | Status: AC
  Administered 2019-09-28: 10 mg
  Filled 2019-09-28: qty 2

## 2019-09-28 MED ORDER — SODIUM CHLORIDE 0.9 % IV SOLN
1.0000 g | INTRAVENOUS | Status: DC
  Administered 2019-09-28: 1 g via INTRAVENOUS

## 2019-09-28 MED ORDER — BUPRENORPHINE 10 MCG/HR TD PTWK
2.0000 | MEDICATED_PATCH | TRANSDERMAL | Status: DC
  Administered 2019-09-28: 2 via TRANSDERMAL
  Filled 2019-09-28: qty 2

## 2019-09-28 NOTE — Progress Notes (Signed)
Physical Therapy Treatment Patient Details Name: Chelsea Allison MRN: [id number] DOB: 05-07-1955 Today's Date: 09/28/2019    History of Present Illness 64 yo female admitted from home 6/29 with respiratory distress and hypoxia.  Intubated in ED. Trach 7/6. pt with respiratory distress and return to vent 7/15. Was in hospital from 6/03 to 6/18 for COPD exacerbation, acute on chronic systolic CHF with NSTEMI requiring intubation who D/Cd to rehab then home 6/22.  PMHx: anal CA s/p colostomy, NSCLC s/p partial resection, CAD, CHF    PT Comments    Initially reluctant to participate, but agreed with minimal encouragement to get OOB and attempt ambulation with the RW.  Emphasis on transition to the EOB, sit to stand and gait training/stability/stamina with the RW.   Follow Up Recommendations  SNF;Supervision/Assistance - 24 hour     Equipment Recommendations  None recommended by PT    Recommendations for Other Services       Precautions / Restrictions Precautions Precautions: Fall Precaution Comments: trach, cortrak, ostomy, sacral wounds Restrictions Weight Bearing Restrictions: No    Mobility  Bed Mobility Overal bed mobility: Needs Assistance Bed Mobility: Supine to Sit     Supine to sit: Min assist Sit to supine: Min guard   General bed mobility comments: min A to come to upright sitting for trunk support  Transfers Overall transfer level: Needs assistance Equipment used: Rolling walker (2 wheeled) Transfers: Sit to/from [facility] Sit to Stand: Min assist Stand pivot transfers: Min assist       General transfer comment: min A for safety with RW. Pt would frequently close eyes and pause in standing position  Ambulation/Gait Ambulation/Gait assistance: Min assist Gait Distance (Feet): 30 Feet Assistive device: Rolling walker (2 wheeled) Gait Pattern/deviations: Step-through pattern;Decreased stride length;Wide base of support   Gait velocity  interpretation: <1.31 ft/sec, indicative of household ambulator General Gait Details: slow, short, tentative steps, frequent stops with eyes closed as if tired   Stairs             Wheelchair Mobility    Modified Rankin (Stroke Patients Only)       Balance Overall balance assessment: Needs assistance Sitting-balance support: Feet supported Sitting balance-Leahy Scale: Good     Standing balance support: Bilateral upper extremity supported;During functional activity Standing balance-Leahy Scale: Poor Standing balance comment: reliant on AD and/or external support                            Cognition Arousal/Alertness: Awake/alert Behavior During Therapy: WFL for tasks assessed/performed Overall Cognitive Status: Within Functional Limits for tasks assessed                                 General Comments: WFL with communication via mouthing words and written communication      Exercises      General Comments General comments (skin integrity, edema, etc.): vss on 35% 8L on TC with mobility      Pertinent Vitals/Pain Pain Assessment: Faces Faces Pain Scale: Hurts a little bit Pain Location: teeth and trach site Pain Descriptors / Indicators: Discomfort Pain Intervention(s): Monitored during session    Home Living                      Prior Function            PT Goals (current goals can now  be found in the care plan section) Acute Rehab PT Goals Patient Stated Goal: go home PT Goal Formulation: With patient Time For Goal Achievement: 10/10/19 Potential to Achieve Goals: Good Progress towards PT goals: Progressing toward goals    Frequency    Min 3X/week      PT Plan Current plan remains appropriate    Co-evaluation PT/OT/SLP Co-Evaluation/Treatment: Yes Reason for Co-Treatment: For patient/therapist safety;To address functional/ADL transfers PT goals addressed during session: Mobility/safety with  mobility;Balance        AM-PAC PT "6 Clicks" Mobility   Outcome Measure  Help needed turning from your back to your side while in a flat bed without using bedrails?: A Little Help needed moving from lying on your back to sitting on the side of a flat bed without using bedrails?: A Little Help needed moving to and from a bed to a chair (including a wheelchair)?: A Little Help needed standing up from a chair using your arms (e.g., wheelchair or bedside chair)?: A Little Help needed to walk in hospital room?: A Little Help needed climbing 3-5 steps with a railing? : A Lot 6 Click Score: 17    End of Session Equipment Utilized During Treatment: Oxygen Activity Tolerance: Patient tolerated treatment well Patient left: with call bell/phone within reach;in bed;with bed alarm set Nurse Communication: Mobility status;Precautions PT Visit Diagnosis: Muscle weakness (generalized) (M62.81);Other abnormalities of gait and mobility (R26.89);Difficulty in walking, not elsewhere classified (R26.2)     Time: 7619-5093 PT Time Calculation (min) (ACUTE ONLY): 27 min  Charges:  $Gait Training: 8-22 mins                     09/28/2019  [name], PT Acute Rehabilitation Services [phone]  (pager) [phone]  (office)   Tessie Fass Ayanni Tun 09/28/2019, 6:20 PM

## 2019-09-28 NOTE — Progress Notes (Signed)
eLink Physician-Brief Progress Note Patient Name: Chelsea Allison DOB: 09-Sep-1955 MRN: [id number]   Date of Service  09/28/2019  HPI/Events of Note  Patient c/o tooth pain. Presently on Oxycodone 10 mg per tube Q 8 hour PRN pain.   eICU Interventions  Plan: 1. Oxycodone 10 mg per tube now.      Intervention Category Major Interventions: Other:  Lysle Dingwall 09/28/2019, 5:35 AM

## 2019-09-28 NOTE — Progress Notes (Signed)
Occupational Therapy Treatment Patient Details Name: Chelsea Allison MRN: [id number] DOB: 21-Nov-1955 Today's Date: 09/28/2019    History of present illness 64 yo female admitted from home 6/29 with respiratory distress and hypoxia.  Intubated in ED. Trach 7/6. pt with respiratory distress and return to vent 7/15. Was in hospital from 6/03 to 6/18 for COPD exacerbation, acute on chronic systolic CHF with NSTEMI requiring intubation who D/Cd to rehab then home 6/22.  PMHx: anal CA s/p colostomy, NSCLC s/p partial resection, CAD, CHF   OT comments  Pt progressing well toward stated goals. Session focused on OOB mobility progression, ECS training, and improving overall activity tolerance for BADL routine. Pt able to complete bed mobility at min A- min guard. Once EOB, pt complete sit <> stands and BSC transfer at min A level with RW. She was then able to engage in functional mobility out of the room with significant increased time and standing rest breaks. Pt would self cue to stop, focus on breathing, and check sats with OTs personal pulse ox. VSS on 35%, 8L TC for mobility. D/c recs remain appropriate considering medical factors/TC/vent management. Pt is progressing well with mobility and BADL independence. Will continue to follow.    Follow Up Recommendations  SNF;Supervision/Assistance - 24 hour    Equipment Recommendations  Tub/shower seat    Recommendations for Other Services      Precautions / Restrictions Precautions Precaution Comments: trach, cortrak, ostomy, sacral wounds Restrictions Weight Bearing Restrictions: No       Mobility Bed Mobility Overal bed mobility: Needs Assistance Bed Mobility: Supine to Sit     Supine to sit: Min assist Sit to supine: Min guard   General bed mobility comments: min A to come to upright sitting for trunk support  Transfers Overall transfer level: Needs assistance Equipment used: Rolling walker (2 wheeled) Transfers: Sit to/from [employer] Sit to Stand: Min assist Stand pivot transfers: Min assist       General transfer comment: min A for safety with RW. Pt would frequently close eyes and pause in standing position    Balance Overall balance assessment: Needs assistance Sitting-balance support: Feet supported Sitting balance-Leahy Scale: Good     Standing balance support: Bilateral upper extremity supported;During functional activity Standing balance-Leahy Scale: Poor Standing balance comment: reliant on external support                           ADL either performed or assessed with clinical judgement   ADL       Grooming: Brushing hair;Sitting Grooming Details (indicate cue type and reason): set up to brush hair sitting EOB                 Toilet Transfer: Minimal assistance;BSC;Stand-pivot Toilet Transfer Details (indicate cue type and reason): min A to stand pivot to [facility]. cues for hand placement for safe descent Toileting- Clothing Manipulation and Hygiene: Set up;Sitting/lateral lean Toileting - Clothing Manipulation Details (indicate cue type and reason): with set up, pt able to complete anterior peri care     Functional mobility during ADLs: Minimal assistance;Min guard;Rolling walker;Cueing for safety;Cueing for sequencing General ADL Comments: able to progress to short household length of functional mobility with increased progression of standing tolerance     Vision       Perception     Praxis      Cognition Arousal/Alertness: Awake/alert Behavior During Therapy: WFL for tasks assessed/performed Overall Cognitive Status: Within  Functional Limits for tasks assessed                                 General Comments: WFL with communication via mouthing words and written communication        Exercises     Shoulder Instructions       General Comments VSS on 35%, 8L for mobility TC    Pertinent Vitals/ Pain       Pain Assessment:  Faces Faces Pain Scale: Hurts a little bit Pain Location: trach site is uncomfortable Pain Descriptors / Indicators: Discomfort Pain Intervention(s): Monitored during session  Home Living                                          Prior Functioning/Environment              Frequency  Min 2X/week        Progress Toward Goals  OT Goals(current goals can now be found in the care plan section)  Progress towards OT goals: Progressing toward goals  Acute Rehab OT Goals Patient Stated Goal: go home OT Goal Formulation: With patient Time For Goal Achievement: 10/09/19 Potential to Achieve Goals: Good  Plan Discharge plan remains appropriate    Co-evaluation                 AM-PAC OT "6 Clicks" Daily Activity     Outcome Measure   Help from another person eating meals?: Total (NPO) Help from another person taking care of personal grooming?: A Little Help from another person toileting, which includes using toliet, bedpan, or urinal?: A Little Help from another person bathing (including washing, rinsing, drying)?: A Lot Help from another person to put on and taking off regular upper body clothing?: A Little Help from another person to put on and taking off regular lower body clothing?: A Lot 6 Click Score: 14    End of Session Equipment Utilized During Treatment: Gait belt;Oxygen;Rolling walker  OT Visit Diagnosis: Muscle weakness (generalized) (M62.81);Unsteadiness on feet (R26.81)   Activity Tolerance Patient tolerated treatment well   Patient Left in bed;with call bell/phone within reach   Nurse Communication Mobility status        Time: 7939-0300 OT Time Calculation (min): 45 min  Charges: OT General Charges $OT Visit: 1 Visit OT Treatments $Self Care/Home Management : 23-37 mins  Zenovia Jarred, MSOT, OTR/L [facility] Office Number: [phone] Pager: [phone]  Zenovia Jarred 09/28/2019, 5:55  PM

## 2019-09-28 NOTE — Progress Notes (Signed)
eLink Physician-Brief Progress Note Patient Name: Jessiah Wojnar DOB: 09-04-1955 MRN: [id number]   Date of Service  09/28/2019  HPI/Events of Note  Patient with tracheostomy on a trach collar who desaturated to the 60's, she was suctioned and placed on the ventilator with recovery of her saturation to 96 %, she has symmetrical breath sounds.  eICU Interventions  No new intervention.        Kerry Kass Shantia Sanford 09/28/2019, 11:00 PM

## 2019-09-28 NOTE — Progress Notes (Signed)
SLP Cancellation Note  Patient Details Name: Chelsea Allison MRN: [id number] DOB: [birth year]   Cancelled treatment:        Pt on ventilator at this time, but has been on TCT earlier today.  Will see how pt tolerates further TCT today and if she remains stable, we may be able to proceed to MBSS in the next day or two.   Celedonio Savage, MA, [place] Office: [phone] 09/28/2019, 9:34 AM

## 2019-09-28 NOTE — Progress Notes (Signed)
[facility]  PROGRESS NOTE  Chelsea Allison DDU:[id number] DOB: 1955-04-07 DOA: 09/05/2019 PCP: Beckie Salts, MD   Brief HPI:   64 year old female with past medical history of CAD s/p PCI in October 2020, COPD, dyslipidemia, hypertension, non-small cell lung cancer stage III status post partial lung resection, anal cancer status post colectomy was admitted to the hospital with acute on chronic hypoxic/hypercapnic respiratory failure from pneumonia and COPD.  Patient is status post tracheostomy.  She is currently requiring mechanical ventilation.  PCCM is following.    Subjective   Patient seen and examined, denies any complaints.  Denies chest pain    Assessment/Plan:     1. Acute on chronic hypoxemic/hypercapnic respiratory failure-secondary to COPD exacerbation, right upper lobe pneumonia.  Patient underwent tracheostomy on 09/12/2019 for inability to wean off ventilator and repeated intubations..  Patient received antibiotic course for E. coli/Klebsiella pneumonia.  Currently patient is requiring mechanical ventilation.  PCCM following 2. COPD exacerbation-patient was transferred out of ICU and to ICU on 09/25/2019 after mucous plugging event.  Continue bronchodilators, chest PT, wean ventilator as tolerated. 3. Chronic cancer-related pain/anxiety-continue oxycodone 10 mg every 8 hours, buprenorphine patch, Seroquel 12.5 mg p.o. twice daily. 4. Chronic HFrEF-patient has history of CAD, EF 35 to 40% on echo from 08/12/2019.  Continue aspirin, Brilinta. 5. Diabetes mellitus type 2-blood sugar stable, continue sliding scale insulin with NovoLog. 6. History of non-small cell lung cancer-status post partial lung resection.  Follow-up oncology as outpatient. 7. History of rectal cancer s/p colectomy-stable 8. Iron deficiency anemia-hemoglobin is stable, transfuse for hemoglobin less than 7.  No clear source of bleeding.    Scheduled medications:   . arformoterol  15 mcg Nebulization BID   . vitamin C  250 mg Per Tube BID  . aspirin  81 mg Per Tube Daily  . buprenorphine  2 patch Transdermal Weekly  . chlorhexidine gluconate (MEDLINE KIT)  15 mL Mouth Rinse BID  . Chlorhexidine Gluconate Cloth  6 each Topical Daily  . clonazePAM  1 mg Per Tube QHS  . collagenase   Topical Daily  . docusate  100 mg Per Tube BID  . enoxaparin (LOVENOX) injection  40 mg Subcutaneous Q24H  . feeding supplement (PROSource TF)  45 mL Per Tube TID  . free water  50 mL Per Tube Q8H  . guaiFENesin  15 mL Per Tube Q6H  . insulin aspart  0-15 Units Subcutaneous Q4H  . mouth rinse  15 mL Mouth Rinse 10 times per day  . oxyCODONE  10 mg Per Tube Q8H  . pantoprazole sodium  40 mg Per Tube Daily  . polyethylene glycol  17 g Per Tube Daily  . QUEtiapine  12.5 mg Per Tube BID  . revefenacin  175 mcg Nebulization Daily  . rosuvastatin  20 mg Per Tube Daily  . sodium chloride HYPERTONIC  4 mL Nebulization Daily  . ticagrelor  90 mg Per Tube BID  . vitamin B-12  1,000 mcg Per Tube QODAY  . Vitamin D (Ergocalciferol)  50,000 Units Per Tube Q7 days  . zinc sulfate  220 mg Per Tube Daily     Antibiotics:   Anti-infectives (From admission, onward)   Start     Dose/Rate Route Frequency Ordered Stop   09/18/19 1000  ceFAZolin (ANCEF) IVPB 2g/100 mL premix        2 g 200 mL/hr over 30 Minutes Intravenous Every 8 hours 09/18/19 0933 09/25/19 0406   09/06/19 0830  fluconazole (DIFLUCAN) tablet  150 mg        150 mg Per Tube  Once 09/06/19 0743 09/06/19 0834   09/05/19 2000  vancomycin (VANCOCIN) IVPB 1000 mg/200 mL premix  Status:  Discontinued        1,000 mg 200 mL/hr over 60 Minutes Intravenous Every 12 hours 09/05/19 1048 09/06/19 0743   09/05/19 1400  piperacillin-tazobactam (ZOSYN) IVPB 3.375 g  Status:  Discontinued        3.375 g 12.5 mL/hr over 240 Minutes Intravenous Every 8 hours 09/05/19 1048 09/09/19 1159   09/05/19 0830  vancomycin (VANCOCIN) IVPB 1000 mg/200 mL premix        1,000  mg 200 mL/hr over 60 Minutes Intravenous  Once 09/05/19 0827 09/05/19 1033   09/05/19 0830  piperacillin-tazobactam (ZOSYN) IVPB 3.375 g        3.375 g 12.5 mL/hr over 240 Minutes Intravenous  Once 09/05/19 0827 09/05/19 1117      CBG: Recent Labs  Lab 09/27/19 1935 09/27/19 2351 09/28/19 0336 09/28/19 0730 09/28/19 1148  GLUCAP 102* 122* 122* 132* 134*    SpO2: 98 % O2 Flow Rate (L/min): 8 L/min FiO2 (%): 35 %    CBC: Recent Labs  Lab 09/23/19 0251 09/23/19 0251 09/23/19 0504 09/24/19 0317 09/26/19 0142 09/26/19 0505 09/27/19 0912  WBC 12.6*  --   --  7.1  --  12.1* 8.0  NEUTROABS  --   --   --  5.0  --  9.5*  --   HGB 9.5*   < > 10.9* 8.5* 11.6* 9.2* 8.9*  HCT 33.1*   < > 32.0* 30.5* 34.0* 32.4* 32.1*  MCV 92.5  --   --  93.6  --  93.9 95.3  PLT 396  --   --  372  --  383 332   < > = values in this interval not displayed.    Basic Metabolic Panel: Recent Labs  Lab 09/22/19 0322 09/22/19 0408 09/23/19 0251 09/23/19 0251 09/23/19 0504 09/24/19 0317 09/26/19 0142 09/26/19 0505 09/27/19 0912  NA 139   < > 137   < > 139 140 140 139 140  K 3.6   < > 3.5   < > 3.4* 3.7 3.8 4.2 4.4  CL 95*  --  95*  --   --  97*  --  100 100  CO2 32  --  30  --   --  33*  --  28 31  GLUCOSE 203*  --  191*  --   --  152*  --  133* 149*  BUN 18  --  30*  --   --  28*  --  32* 31*  CREATININE 0.74  --  0.85  --   --  0.68  --  0.85 0.56  CALCIUM 9.8  --  9.1  --   --  9.4  --  9.6 10.0  MG  --   --  2.2  --   --   --   --   --   --   PHOS  --   --  4.4  --   --   --   --   --   --    < > = values in this interval not displayed.     Liver Function Tests: Recent Labs  Lab 09/26/19 0505  AST 18  ALT 8  ALKPHOS 77  BILITOT 0.4  PROT 6.6  ALBUMIN 2.5*      DVT  prophylaxis: Lovenox  Code Status: Full code  Family Communication: No family at bedside    Status is: Inpatient  Dispo: The patient is from: Home              Anticipated d/c is to: Skilled  nursing facility              Anticipated d/c date is: 10/04/2019              Patient currently not medically stable for discharge, requiring mechanical ventilation  Barrier to discharge-currently requiring mechanical ventilation, not ready for discharge.  Pressure Injury 09/07/19 Sacrum Unstageable - Full thickness tissue loss in which the base of the injury is covered by slough (yellow, tan, gray, green or brown) and/or eschar (tan, brown or black) in the wound bed. (Active)  09/07/19 1000  Location: Sacrum  Location Orientation:   Staging: Unstageable - Full thickness tissue loss in which the base of the injury is covered by slough (yellow, tan, gray, green or brown) and/or eschar (tan, brown or black) in the wound bed.  Wound Description (Comments):   Present on Admission: Yes     Pressure Injury 09/10/19 Sacrum Lower Stage 3 -  Full thickness tissue loss. Subcutaneous fat may be visible but bone, tendon or muscle are NOT exposed. 1x1x1 (Active)  09/10/19 1355  Location: Sacrum  Location Orientation: Lower  Staging: Stage 3 -  Full thickness tissue loss. Subcutaneous fat may be visible but bone, tendon or muscle are NOT exposed.  Wound Description (Comments): 1x1x1  Present on Admission:      Pressure Injury 09/10/19 Lip Left skin breakdown from ETT (Active)  09/10/19 0700  Location: Lip  Location Orientation: Left  Staging:   Wound Description (Comments): skin breakdown from ETT  Present on Admission:          Consultants:  PCCM  Procedures:  S/p tracheostomy   Objective   Vitals:   09/28/19 1000 09/28/19 1100 09/28/19 1200 09/28/19 1300  BP: 113/76 (!) 106/60 114/65 103/65  Pulse: 93 85 95 97  Resp: (!) 26 22 (!) 26 (!) 25  Temp:      TempSrc:      SpO2: 96% 96% 100% 98%  Weight:      Height:        Intake/Output Summary (Last 24 hours) at 09/28/2019 1351 Last data filed at 09/28/2019 1329 Gross per 24 hour  Intake 1720 ml  Output 875 ml  Net 845 ml     07/20 1901 - 07/22 0700 In: 2780  Out: 1075 [Urine:950]  Filed Weights   09/20/19 0500 09/25/19 0122 09/27/19 0600  Weight: 79.5 kg 82.1 kg 82.7 kg    Physical Examination:    General: Appears in no acute distress  Cardiovascular: S1-S2, regular, no murmur auscultated  Respiratory: Clear to auscultation bilaterally, no wheezing or crackles auscultated  Abdomen: Abdomen is soft, nontender, no organomegaly  Extremities: No edema in the lower extremities  Neurologic: Alert, oriented x3, no focal deficit noted    Data Reviewed:   Recent Results (from the past 240 hour(s))  Culture, respiratory (non-expectorated)     Status: None (Preliminary result)   Collection Time: 09/26/19  9:46 AM   Specimen: Tracheal Aspirate; Respiratory  Result Value Ref Range Status   Specimen Description TRACHEAL ASPIRATE  Final   Special Requests Normal  Final   Gram Stain   Final    RARE WBC PRESENT, PREDOMINANTLY PMN FEW GRAM NEGATIVE RODS FEW GRAM POSITIVE  COCCI IN PAIRS    Culture   Final    FEW ESCHERICHIA COLI CULTURE REINCUBATED FOR BETTER GROWTH Performed at [facility], [place] [address], [place], [place] [address]    Report Status PENDING  Incomplete   Organism ID, Bacteria ESCHERICHIA COLI  Final      Susceptibility   Escherichia coli - MIC*    AMPICILLIN <=2 SENSITIVE Sensitive     CEFAZOLIN <=4 SENSITIVE Sensitive     CEFEPIME <=0.12 SENSITIVE Sensitive     CEFTAZIDIME <=1 SENSITIVE Sensitive     CEFTRIAXONE <=0.25 SENSITIVE Sensitive     CIPROFLOXACIN 1 SENSITIVE Sensitive     GENTAMICIN <=1 SENSITIVE Sensitive     IMIPENEM <=0.25 SENSITIVE Sensitive     TRIMETH/SULFA <=20 SENSITIVE Sensitive     AMPICILLIN/SULBACTAM <=2 SENSITIVE Sensitive     PIP/TAZO <=4 SENSITIVE Sensitive     * FEW ESCHERICHIA COLI    No results for input(s): LIPASE, AMYLASE in the last 168 hours. No results for input(s): AMMONIA in the last 168 hours.  Cardiac Enzymes: No results  for input(s): CKTOTAL, CKMB, CKMBINDEX, TROPONINI in the last 168 hours. BNP (last 3 results) Recent Labs    08/10/19 2150 08/13/19 0225 09/05/19 0735  BNP 318.9* 929.4* 2,477.5*    ProBNP (last 3 results) No results for input(s): PROBNP in the last 8760 hours.  Studies:  DG CHEST PORT 1 VIEW  Result Date: 09/27/2019 CLINICAL DATA:  History of cardiomyopathy and respiratory difficulty EXAM: PORTABLE CHEST 1 VIEW COMPARISON:  09/25/2018 FINDINGS: Cardiac shadow is stable. Tracheostomy tube and feeding catheter are again noted and stable. Lungs are well aerated bilaterally with significant improved aeration on the left. Some residual opacity is noted in the left mid lung. IMPRESSION: Near complete resolution of previously seen infiltrate. Electronically Signed   By: Inez Catalina M.D.   On: 09/27/2019 14:46       [facility] If 7PM-7AM, please contact night-coverage at [email or web address], Office  [phone]   09/28/2019, 1:51 PM  LOS: 23 days

## 2019-09-28 NOTE — Progress Notes (Signed)
NAME:  Chelsea Allison, MRN:  [id number], DOB:  10-29-1955, LOS: 23 ADMISSION DATE:  09/05/2019, CONSULTATION DATE:  09/05/19 REFERRING MD:  Stark Jock- EM, CHIEF COMPLAINT:  Respiratory Distress, AMS  Brief History   64 yo female smoker presented to ED with altered mental status from acute on chronic hypoxic/hypercapnic respiratory failure 2nd to HCAP in setting of COPD.  Past Medical History  NSCLS s/p partial resection Anal cancer s/p colostomy CAD with MI s/p PCI COPD on home O2 HFrEF Tobacco abuse  Acute on chronic respiratory failure requiring intubation, repeated   Significant Hospital Events   6/29 admitted to [facility]  7/1 hgb drop.  7/2 SBTx4h 7/4 only tolerated 75min on pressure support. Starting precedex/klonapin. Obtaining hemolytic labs. 7/6 trached 7/9: Copious secretions still noted.  Tachypneic.  Mildly hypotensive.  Presuming anxiety complicating weaning efforts titration of anxiolytic regimen performed, starting aerosol trach collar trials 7/10: Was on trach collar, pulse oximetry dropping to 40s supplemental oxygen increase but still on the needed achieve 70% FiO2 placed back on ventilator lavaged for pink-tan secretions saturations improved back on vent, checking sputum culture and chest x-ray. 7/11 looks better. Working on weaning  7/19 transfer back to ICU after severe desaturations and also hypercarbic failure.  7/21 no overnight events  Procedures:  ETT 6/29-7/6 Tracheostomy 7/6  Significant Diagnostic Tests:  CXR 7/2: appropriate positioning of ETT, NG tubes, no acute overlying lung processes noted  CXR 7/6: new patchy left lung base opacity which could represent atelectasis vs aspiration vs pna, tracheostomy in place  Micro Data:  6/29 SARS Cov2>negative 6/29 tracheal aspirate>  6/29 UCx> less than 10,000 colonies insignificant growth 6/29 BCx> NG 6/29 MRSA PCR>negative 7/10: Sputum culture: E. coli, Klebsiella  Antimicrobials:  6/29  vancomycin>6/30 6/29 pip/tazo> 7/3 Ancef 7/12 -7/18  Interim history/subjective:  Currently stable on CPAP/PS . 10/5 Anxious, but totally alert and appropriate No overnight events reported  Objective   Blood pressure (!) 93/58, pulse 82, temperature 98.2 F (36.8 C), temperature source Oral, resp. rate (!) 24, height 5\' 9"  (1.753 m), weight 82.7 kg, SpO2 94 %.    Vent Mode: PRVC FiO2 (%):  [28 %-40 %] 30 % Set Rate:  [24 bmp] 24 bmp Vt Set:  [530 mL] 530 mL PEEP:  [5 cmH20] 5 cmH20 Plateau Pressure:  [24 cmH20-26 cmH20] 25 cmH20   Intake/Output Summary (Last 24 hours) at 09/28/2019 0949 Last data filed at 09/28/2019 0708 Gross per 24 hour  Intake 1600 ml  Output 600 ml  Net 1000 ml   Filed Weights   09/20/19 0500 09/25/19 0122 09/27/19 0600  Weight: 79.5 kg 82.1 kg 82.7 kg    Examination: General: chronically ill appearing elderly female.  Comfortable on vent,stable  HEENT: 6 cuffed Shiley trach midline, PERRL, no appreciable JVD, No LAD Neuro: alert and oriented, communicating writing notes CV: S1-S2 appreciated with no murmur PULM: Bilateral rhonchi noted, decreased at the bases  GI: soft, hypoactive, non-distended, NT Extremities: warm/dry, no edema, no obvious defortmities  Skin: no rashes or lesions, warm and intact  Assessment & Plan:   Acute on chronic hypercapnic hypoxic respiratory failure post COVID pneumonia and now s/p antibiotic course for for E.coli/Klebsiella PNA. S/p tracheostomy 7/6 for inability to wean off vent and repeated intubations  -Currently tolerating pressure support of 10, PEEP of 5 -Continue trach collar trials  COPD s/p acute exacerbation Transferred back to ICU 7/19 after mucus plugging event Repeat chest x-ray showing near complete resolution of previous  infiltrate-reviewed by myself Plan - Routine trach care -Continue bronchodilators -Continue 3% saline nebs x 3 days to thin secretions -Continue chest PT - PT/OT  - Wean vent as  tolerated/ CPAP/ ATC as able - Sputum Culture showing pansensitive E. Coli-we will start on Rocephin for 7 days  Chronic cancer related pain Anxiety  Current regimen: Seroquel 25 BID, buprenorphine patch, Klonopin at bedtime, and Oxy 10 mg q6 - mental status has recovered, decreased Seroquel to 12.5 BID-continue this at present -Oxycodone decreased to every 8   Chronic HFrEF w/ h/o CAD  EF 35-40% on 08/12/2019 - Continue telemetry  - Continue aspirin Brilinta and Daily assessment for diuresis   Iron deficient Anemia.  Stable and Hgb trending up. No obvious source of bleeding. - Trend CBC intermittently and transfuse for hemoglobin less than 7  DM type 2 - Blood sugars stable - Plan - Sliding scale insulin  Hx NSCLC s/p partial resection XLT. Hx anal cancer s/p colectomy. Plan - Outpatient follow-up at some point  High risk malnutrition.  Plan - Tube feeds  Patient transferred to hospitalist service She remains in ICU for vent weaning  Best practice:  Diet: tube feeds DVT prophylaxis: lovenox GI prophylaxis: protonix Mobility: PT/ Code Status: Full  Disposition:  Transfer to ICU, will likely dispo to [facility] Family update: updated patient on today's plan  The patient is critically ill with multiple organ systems failure and requires high complexity decision making for assessment and support, frequent evaluation and titration of therapies, application of advanced monitoring technologies and extensive interpretation of multiple databases. Critical Care Time devoted to patient care services described in this note independent of APP/resident time (if applicable)  is 30 minutes.   Sherrilyn Rist MD [place] Personal pager: [phone] If unanswered, please page CCM On-call: [phone]

## 2019-09-29 LAB — GLUCOSE, CAPILLARY
Glucose-Capillary: 156 mg/dL — ABNORMAL HIGH (ref 70–99)
Glucose-Capillary: 137 mg/dL — ABNORMAL HIGH (ref 70–99)
Glucose-Capillary: 130 mg/dL — ABNORMAL HIGH (ref 70–99)
Glucose-Capillary: 108 mg/dL — ABNORMAL HIGH (ref 70–99)
Glucose-Capillary: 120 mg/dL — ABNORMAL HIGH (ref 70–99)

## 2019-09-29 MED ORDER — ALBUTEROL SULFATE (2.5 MG/3ML) 0.083% IN NEBU: 2.5000 mg | INHALATION_SOLUTION | Freq: Four times a day (QID) | RESPIRATORY_TRACT | Status: DC | PRN

## 2019-09-29 MED ORDER — CLONAZEPAM 0.5 MG PO TABS
0.5000 mg | ORAL_TABLET | Freq: Two times a day (BID) | ORAL | Status: DC
  Administered 2019-09-29 – 2019-10-04 (×11): 0.5 mg
  Filled 2019-09-29 (×11): qty 1

## 2019-09-29 MED ORDER — ALBUTEROL SULFATE (2.5 MG/3ML) 0.083% IN NEBU
2.5000 mg | INHALATION_SOLUTION | RESPIRATORY_TRACT | Status: DC | PRN
  Administered 2019-09-29 – 2019-10-03 (×2): 2.5 mg via RESPIRATORY_TRACT
  Filled 2019-09-29 (×2): qty 3

## 2019-09-29 MED ORDER — FREE WATER
200.0000 mL | Status: DC
  Administered 2019-09-29 – 2019-10-02 (×18): 200 mL

## 2019-09-29 MED ORDER — ALBUTEROL SULFATE (2.5 MG/3ML) 0.083% IN NEBU: 2.5000 mg | INHALATION_SOLUTION | RESPIRATORY_TRACT | Status: DC

## 2019-09-29 MED ORDER — CLONAZEPAM 0.5 MG PO TABS: 0.5000 mg | ORAL_TABLET | Freq: Two times a day (BID) | ORAL | Status: DC

## 2019-09-29 MED ORDER — CEFAZOLIN SODIUM-DEXTROSE 2-4 GM/100ML-% IV SOLN
2.0000 g | Freq: Three times a day (TID) | INTRAVENOUS | Status: AC
  Administered 2019-09-29 – 2019-10-04 (×16): 2 g via INTRAVENOUS
  Filled 2019-09-29 (×16): qty 100

## 2019-09-29 MED ORDER — CLONAZEPAM 1 MG PO TABS: 1.0000 mg | ORAL_TABLET | Freq: Two times a day (BID) | ORAL | Status: DC

## 2019-09-29 NOTE — Progress Notes (Signed)
Called to pt's room due to desaturation while on ACT.  SpO2 dropped in to the 40's.  Pt placed back on full vent support,Fi02 increased to 100% and suction.  Pt starting to gain consciousness and color back.  Attempted to wean  Fio2 to 60% and Sp02 dropped back down to the 70's.  Pt taken off the vent and bagged lavaged.  Pt placed back on full vent support.  FiO2 weaned down to 40%, Sp02 97% and pt tolerating well at this time. RT will continue to monitor.

## 2019-09-29 NOTE — Progress Notes (Signed)
Physical Therapy Treatment Patient Details Name: Chelsea Allison MRN: [id number] DOB: 03/04/1956 Today's Date: 09/29/2019    History of Present Illness 64 yo female admitted from home 6/29 with respiratory distress and hypoxia.  Intubated in ED. Trach 7/6. pt with respiratory distress and return to vent 7/15. Was in hospital from 6/03 to 6/18 for COPD exacerbation, acute on chronic systolic CHF with NSTEMI requiring intubation who D/Cd to rehab then home 6/22.  PMHx: anal CA s/p colostomy, NSCLC s/p partial resection, CAD, CHF    PT Comments    Unable to ambulate in the halls as planned due to now on the vent at 40% FiO2.  Worked on standing multiple times and transferring to the chair. VSS    Follow Up Recommendations  SNF;Supervision/Assistance - 24 hour     Equipment Recommendations  None recommended by PT    Recommendations for Other Services       Precautions / Restrictions Precautions Precautions: Fall Precaution Comments: trach, cortrak, ostomy, sacral wounds    Mobility  Bed Mobility Overal bed mobility: Needs Assistance Bed Mobility: Rolling;Sidelying to Sit Rolling: Min guard Sidelying to sit: Supervision;HOB elevated          Transfers Overall transfer level: Needs assistance Equipment used: Rolling walker (2 wheeled) Transfers: Sit to/from [facility] Sit to Stand: Min assist;Min guard Stand pivot transfers: Min assist       General transfer comment: cued to get prepped better for the transfer, hand placement.  Initionally min assist , but 1 min guard stand attained.  Ambulation/Gait             General Gait Details: pt back on the vent so unable to ambulate.   Stairs             Wheelchair Mobility    Modified Rankin (Stroke Patients Only)       Balance Overall balance assessment: Needs assistance Sitting-balance support: Feet supported Sitting balance-Leahy Scale: Good     Standing balance support: Single  extremity supported Standing balance-Leahy Scale: Poor Standing balance comment: reliant minimally on external support OR AD/stationary object.                            Cognition Arousal/Alertness: Awake/alert Behavior During Therapy: WFL for tasks assessed/performed Overall Cognitive Status: Within Functional Limits for tasks assessed                                 General Comments: WFL with communication via mouthing words and written communication      Exercises      General Comments General comments (skin integrity, edema, etc.): vss on 40% FiO2 on vent      Pertinent Vitals/Pain Pain Assessment: Faces Faces Pain Scale: Hurts a little bit Pain Location: sacral wound Pain Descriptors / Indicators: Discomfort Pain Intervention(s): Monitored during session    Home Living                      Prior Function            PT Goals (current goals can now be found in the care plan section) Acute Rehab PT Goals Patient Stated Goal: go home PT Goal Formulation: With patient Time For Goal Achievement: 10/10/19 Potential to Achieve Goals: Good Progress towards PT goals: Progressing toward goals    Frequency    Min 3X/week  PT Plan Current plan remains appropriate    Co-evaluation              AM-PAC PT "6 Clicks" Mobility   Outcome Measure  Help needed turning from your back to your side while in a flat bed without using bedrails?: A Little Help needed moving from lying on your back to sitting on the side of a flat bed without using bedrails?: A Little Help needed moving to and from a bed to a chair (including a wheelchair)?: A Little Help needed standing up from a chair using your arms (e.g., wheelchair or bedside chair)?: A Little Help needed to walk in hospital room?: A Little Help needed climbing 3-5 steps with a railing? : A Lot 6 Click Score: 17    End of Session Equipment Utilized During Treatment:  Oxygen Activity Tolerance: Patient tolerated treatment well Patient left: in chair;with call bell/phone within reach;with chair alarm set Nurse Communication: Mobility status PT Visit Diagnosis: Other abnormalities of gait and mobility (R26.89);Difficulty in walking, not elsewhere classified (R26.2)     Time: 0177-9390 PT Time Calculation (min) (ACUTE ONLY): 20 min  Charges:  $Therapeutic Activity: 8-22 mins                     09/29/2019  [name], PT Acute Rehabilitation Services [phone]  (pager) [phone]  (office)   Chelsea Allison 09/29/2019, 2:06 PM

## 2019-09-29 NOTE — Progress Notes (Signed)
Pt called out because she felt anxious, NT attempted calm pt while RN was in another pt's room. Pt began to desaturate. Two RN's went in to suction pt. Pt continued to desaturate and bean to turn blue, pt was then placed on ventilator. This RN and RT came into the room as pt was put back onto the vent. Pt regained her color and saturation returned to normal. Elink made aware.

## 2019-09-29 NOTE — Progress Notes (Addendum)
NAME:  Chelsea Allison, MRN:  [id number], DOB:  Nov 18, 1955, LOS: 24 ADMISSION DATE:  09/05/2019, CONSULTATION DATE:  09/05/19 REFERRING MD:  Stark Jock- EM, CHIEF COMPLAINT:  Respiratory Distress, AMS  Brief History   64 yo female smoker presented to ED with altered mental status from acute on chronic hypoxic/hypercapnic respiratory failure 2nd to HCAP in setting of COPD.  Past Medical History  NSCLS s/p partial resection Anal cancer s/p colostomy CAD with MI s/p PCI COPD on home O2 HFrEF Tobacco abuse  Acute on chronic respiratory failure requiring intubation, repeated   Significant Hospital Events   6/29 admitted to [facility]  7/1 hgb drop.  7/2 SBTx4h 7/4 only tolerated 89min on pressure support. Starting precedex/klonopin. Obtaining hemolytic labs. 7/6 trached 7/9: Copious secretions still noted.  Tachypneic.  Mildly hypotensive.  Presuming anxiety complicating weaning efforts titration of anxiolytic regimen performed, starting aerosol trach collar trials 7/10: Was on trach collar, pulse oximetry dropping to 40s supplemental oxygen increase but still on the needed achieve 70% FiO2 placed back on ventilator lavaged for pink-tan secretions saturations improved back on vent, checking sputum culture and chest x-ray. 7/11 looks better. Working on weaning  7/19 transfer back to ICU after severe desaturations and also hypercarbic failure.  7/21 no overnight events 7/22 on vent for desaturation to 60s with mucus plugging event  Procedures:  ETT 6/29-7/6 Tracheostomy 7/6  Significant Diagnostic Tests:  CXR 7/2: appropriate positioning of ETT, NG tubes, no acute overlying lung processes noted  CXR 7/6: new patchy left lung base opacity which could represent atelectasis vs aspiration vs pna, tracheostomy in place  Micro Data:  6/29 SARS Cov2>negative 6/29 tracheal aspirate>  6/29 UCx> less than 10,000 colonies insignificant growth 6/29 BCx> NG 6/29 MRSA PCR>negative 7/10: Sputum culture: E.  coli, Klebsiella  Antimicrobials:  6/29 vancomycin>6/30 6/29 pip/tazo> 7/3 Ancef 7/12 -7/18 CTX 7/22-7/23 Ancef 7/23>   Interim history/subjective:  Had to go on vent last night for desaturation to 60s with mucus plugging/bronchospasming. Very anxious, on trach collar this AM, but uncomfortable.  Objective   Blood pressure 103/66, pulse 83, temperature 98.5 F (36.9 C), temperature source Oral, resp. rate (!) 24, height 5\' 9"  (1.753 m), weight 82.7 kg, SpO2 97 %.    Vent Mode: Stand-by FiO2 (%):  [30 %-40 %] 35 % Set Rate:  [24 bmp] 24 bmp Vt Set:  [530 mL] 530 mL PEEP:  [5 cmH20] 5 cmH20 Plateau Pressure:  [23 cmH20-28 cmH20] 28 cmH20   Intake/Output Summary (Last 24 hours) at 09/29/2019 1004 Last data filed at 09/29/2019 0800 Gross per 24 hour  Intake 1680 ml  Output 575 ml  Net 1105 ml   Filed Weights   09/20/19 0500 09/25/19 0122 09/27/19 0600  Weight: 79.5 kg 82.1 kg 82.7 kg    Examination: General: chronically ill appearing elderly female.  Anxious on trach collar,stable  HEENT: 6 cuffed Shiley trach midline, PERRL, no appreciable JVD, No LAD Neuro: alert and oriented, communicating writing notes CV: S1-S2 appreciated with no murmur PULM: Bilateral rhonchi noted, decreased at the bases  GI: soft, hypoactive, non-distended, NT Extremities: warm/dry, no edema, no obvious defortmities  Skin: no rashes or lesions, warm and intact COVID Assessment & Plan:   Acute on chronic hypercapnic hypoxic respiratory failure post HCAP pneumonia and now s/p antibiotic course for for E.coli/Klebsiella PNA, however restarted due to respiratory cx for 5 days. S/p tracheostomy 7/6 for inability to wean off vent and repeated intubations  -Currently satting well in  mid 90s on trach collar, may intermittently require vent -Continue trach collar trials as able -Albuterol for bronchospasm  COPD s/p acute exacerbation Transferred back to ICU 7/19 after mucus plugging event Repeat chest  x-ray showing near complete resolution of previous infiltrate-reviewed by myself Plan - Routine trach care -Continue bronchodilators -Continue 3% saline nebs x 3 days to thin secretions -Continue chest PT - PT/OT  - Wean vent as tolerated/ CPAP/ ATC as able - Sputum Culture showing pansensitive E. Coli-we will continue Ancef for 5 days  Chronic cancer related pain Anxiety  Current regimen: Seroquel 25 BID, buprenorphine patch, Klonopin 0.5 mg BID, and Oxy 10 mg q8h - mental status has recovered, decreased Seroquel to 12.5 BID-continue this at present - klonopin frequency increased to BID for anxiety, total daily dose remains the same. Patient uses alprazolam 0.5mg  BID at home.  Chronic HFrEF w/ h/o CAD  EF 35-40% on 08/12/2019 - Continue telemetry  - Continue aspirin Brilinta and Daily assessment for diuresis   Iron deficient Anemia.  Stable and Hgb trending up. No obvious source of bleeding. - Trend CBC intermittently and transfuse for hemoglobin less than 7  DM type 2 - Blood sugars stable - Plan - Sliding scale insulin  Hx NSCLC s/p partial resection XLT. Hx anal cancer s/p colectomy. Plan - Outpatient follow-up at some point  High risk malnutrition.  Plan - Tube feeds  Patient transferred to hospitalist service She remains in ICU for vent weaning  Best practice:  Diet: tube feeds DVT prophylaxis: lovenox GI prophylaxis: protonix Mobility: PT Code Status: Full  Disposition:  ICU while weaning vent, will likely dispo to [facility] Family update: updated patient   [name], MD [place] [facility], PGY-2

## 2019-09-29 NOTE — Progress Notes (Signed)
SLP Cancellation Note  Patient Details Name: Chelsea Allison MRN: [id number] DOB: 24-Dec-1955   Cancelled treatment:        Noted pt on trach collar per Epic. Arrived at room and pt on vent. Per RN she tolerated TC for 2 hours. MD's attempting changes with anxiety meds per RN. Plan to check on pt Monday as schedule allows.    Houston Siren 09/29/2019, 12:30 PM   Orbie Pyo Colvin Caroli.Ed Risk analyst [phone] Office [phone]

## 2019-09-29 NOTE — Progress Notes (Signed)
[facility]  PROGRESS NOTE  Chelsea Allison VQQ:[id number] DOB: 24-Jul-1955 DOA: 09/05/2019 PCP: Beckie Salts, MD   Brief HPI:   64 year old female with past medical history of CAD s/p PCI in October 2020, COPD, dyslipidemia, hypertension, non-small cell lung cancer stage III status post partial lung resection, anal cancer status post colectomy was admitted to the hospital with acute on chronic hypoxic/hypercapnic respiratory failure from pneumonia and COPD.  Patient is status post tracheostomy.  She is currently requiring mechanical ventilation.  PCCM is following.    Subjective   Patient seen and examined, denies chest pain or shortness of breath.   Assessment/Plan:     1. Acute on chronic hypoxemic/hypercapnic respiratory failure-secondary to COPD exacerbation, right upper lobe pneumonia.  Patient underwent tracheostomy on 09/12/2019 for inability to wean off ventilator and repeated intubations..  Patient received antibiotic course for E. coli/Klebsiella pneumonia.  Currently patient is requiring as needed mechanical ventilation.  PCCM following 2. COPD exacerbation-patient was transferred out of ICU and to ICU on 09/25/2019 after mucous plugging event.  Continue bronchodilators, chest PT, wean ventilator as tolerated. 3. Chronic cancer-related pain/anxiety-continue oxycodone 10 mg every 8 hours, buprenorphine patch, Seroquel 12.5 mg p.o. twice daily. 4. Chronic HFrEF-patient has history of CAD, EF 35 to 40% on echo from 08/12/2019.  Continue aspirin, Brilinta. 5. Diabetes mellitus type 2-blood sugar stable, continue sliding scale insulin with NovoLog. 6. History of non-small cell lung cancer-status post partial lung resection.  Follow-up oncology as outpatient. 7. History of rectal cancer s/p colectomy-stable 8. Iron deficiency anemia-hemoglobin is stable, transfuse for hemoglobin less than 7.  No clear source of bleeding.    Scheduled medications:   . arformoterol  15 mcg  Nebulization BID  . vitamin C  250 mg Per Tube BID  . aspirin  81 mg Per Tube Daily  . buprenorphine  2 patch Transdermal Weekly  . chlorhexidine gluconate (MEDLINE KIT)  15 mL Mouth Rinse BID  . Chlorhexidine Gluconate Cloth  6 each Topical Daily  . clonazePAM  0.5 mg Per Tube BID  . collagenase   Topical Daily  . docusate  100 mg Per Tube BID  . enoxaparin (LOVENOX) injection  40 mg Subcutaneous Q24H  . feeding supplement (PROSource TF)  45 mL Per Tube TID  . free water  200 mL Per Tube Q4H  . guaiFENesin  15 mL Per Tube Q6H  . insulin aspart  0-15 Units Subcutaneous Q4H  . mouth rinse  15 mL Mouth Rinse 10 times per day  . oxyCODONE  10 mg Per Tube Q8H  . pantoprazole sodium  40 mg Per Tube Daily  . polyethylene glycol  17 g Per Tube Daily  . QUEtiapine  12.5 mg Per Tube BID  . revefenacin  175 mcg Nebulization Daily  . rosuvastatin  20 mg Per Tube Daily  . ticagrelor  90 mg Per Tube BID  . vitamin B-12  1,000 mcg Per Tube QODAY  . Vitamin D (Ergocalciferol)  50,000 Units Per Tube Q7 days  . zinc sulfate  220 mg Per Tube Daily     Antibiotics:   Anti-infectives (From admission, onward)   Start     Dose/Rate Route Frequency Ordered Stop   09/29/19 1800  ceFAZolin (ANCEF) IVPB 2g/100 mL premix     Discontinue     2 g 200 mL/hr over 30 Minutes Intravenous Every 8 hours 09/29/19 0915 10/05/19 0159   09/28/19 1630  cefTRIAXone (ROCEPHIN) 1 g in sodium chloride 0.9 % 100  mL IVPB  Status:  Discontinued        1 g 200 mL/hr over 30 Minutes Intravenous Every 24 hours 09/28/19 1630 09/29/19 0915   09/18/19 1000  ceFAZolin (ANCEF) IVPB 2g/100 mL premix        2 g 200 mL/hr over 30 Minutes Intravenous Every 8 hours 09/18/19 0933 09/25/19 0406   09/06/19 0830  fluconazole (DIFLUCAN) tablet 150 mg        150 mg Per Tube  Once 09/06/19 0743 09/06/19 0834   09/05/19 2000  vancomycin (VANCOCIN) IVPB 1000 mg/200 mL premix  Status:  Discontinued        1,000 mg 200 mL/hr over 60 Minutes  Intravenous Every 12 hours 09/05/19 1048 09/06/19 0743   09/05/19 1400  piperacillin-tazobactam (ZOSYN) IVPB 3.375 g  Status:  Discontinued        3.375 g 12.5 mL/hr over 240 Minutes Intravenous Every 8 hours 09/05/19 1048 09/09/19 1159   09/05/19 0830  vancomycin (VANCOCIN) IVPB 1000 mg/200 mL premix        1,000 mg 200 mL/hr over 60 Minutes Intravenous  Once 09/05/19 0827 09/05/19 1033   09/05/19 0830  piperacillin-tazobactam (ZOSYN) IVPB 3.375 g        3.375 g 12.5 mL/hr over 240 Minutes Intravenous  Once 09/05/19 0827 09/05/19 1117      CBG: Recent Labs  Lab 09/28/19 1941 09/28/19 2348 09/29/19 0329 09/29/19 0807 09/29/19 1258  GLUCAP 112* 136* 156* 137* 130*    SpO2: 100 % O2 Flow Rate (L/min): 8 L/min FiO2 (%): 40 %    CBC: Recent Labs  Lab 09/23/19 0251 09/23/19 0251 09/23/19 0504 09/24/19 0317 09/26/19 0142 09/26/19 0505 09/27/19 0912  WBC 12.6*  --   --  7.1  --  12.1* 8.0  NEUTROABS  --   --   --  5.0  --  9.5*  --   HGB 9.5*   < > 10.9* 8.5* 11.6* 9.2* 8.9*  HCT 33.1*   < > 32.0* 30.5* 34.0* 32.4* 32.1*  MCV 92.5  --   --  93.6  --  93.9 95.3  PLT 396  --   --  372  --  383 332   < > = values in this interval not displayed.    Basic Metabolic Panel: Recent Labs  Lab 09/23/19 0251 09/23/19 0251 09/23/19 0504 09/24/19 0317 09/26/19 0142 09/26/19 0505 09/27/19 0912  NA 137   < > 139 140 140 139 140  K 3.5   < > 3.4* 3.7 3.8 4.2 4.4  CL 95*  --   --  97*  --  100 100  CO2 30  --   --  33*  --  28 31  GLUCOSE 191*  --   --  152*  --  133* 149*  BUN 30*  --   --  28*  --  32* 31*  CREATININE 0.85  --   --  0.68  --  0.85 0.56  CALCIUM 9.1  --   --  9.4  --  9.6 10.0  MG 2.2  --   --   --   --   --   --   PHOS 4.4  --   --   --   --   --   --    < > = values in this interval not displayed.     Liver Function Tests: Recent Labs  Lab 09/26/19 0505  AST 18  ALT  8  ALKPHOS 57  BILITOT 0.4  PROT 6.6  ALBUMIN 2.5*      DVT  prophylaxis: Lovenox  Code Status: Full code  Family Communication: No family at bedside    Status is: Inpatient  Dispo: The patient is from: Home              Anticipated d/c is to: Skilled nursing facility              Anticipated d/c date is: 10/04/2019              Patient currently not medically stable for discharge, requiring mechanical ventilation  Barrier to discharge-currently requiring mechanical ventilation, not ready for discharge.  Pressure Injury 09/07/19 Sacrum Unstageable - Full thickness tissue loss in which the base of the injury is covered by slough (yellow, tan, gray, green or brown) and/or eschar (tan, brown or black) in the wound bed. (Active)  09/07/19 1000  Location: Sacrum  Location Orientation:   Staging: Unstageable - Full thickness tissue loss in which the base of the injury is covered by slough (yellow, tan, gray, green or brown) and/or eschar (tan, brown or black) in the wound bed.  Wound Description (Comments):   Present on Admission: Yes     Pressure Injury 09/10/19 Sacrum Lower Stage 3 -  Full thickness tissue loss. Subcutaneous fat may be visible but bone, tendon or muscle are NOT exposed. 1x1x1 (Active)  09/10/19 1355  Location: Sacrum  Location Orientation: Lower  Staging: Stage 3 -  Full thickness tissue loss. Subcutaneous fat may be visible but bone, tendon or muscle are NOT exposed.  Wound Description (Comments): 1x1x1  Present on Admission:      Pressure Injury 09/10/19 Lip Left skin breakdown from ETT (Active)  09/10/19 0700  Location: Lip  Location Orientation: Left  Staging:   Wound Description (Comments): skin breakdown from ETT  Present on Admission:          Consultants:  PCCM  Procedures:  S/p tracheostomy   Objective   Vitals:   09/29/19 0900 09/29/19 1000 09/29/19 1100 09/29/19 1214  BP: (!) 111/63 (!) 133/77 (!) 157/122   Pulse: 95 (!) 113 92   Resp: (!) 27 (!) 27 (!) 27   Temp:    98.4 F (36.9 C)   TempSrc:    Oral  SpO2: 95% 100% 100%   Weight:      Height:        Intake/Output Summary (Last 24 hours) at 09/29/2019 1404 Last data filed at 09/29/2019 0800 Gross per 24 hour  Intake 1440 ml  Output 400 ml  Net 1040 ml    07/21 1901 - 07/23 0700 In: 2740 [I.V.:40] Out: 1275 [Urine:1200]  Filed Weights   09/20/19 0500 09/25/19 0122 09/27/19 0600  Weight: 79.5 kg 82.1 kg 82.7 kg    Physical Examination:    General-appears in no acute distress  Heart-S1-S2, regular, no murmur auscultated  HEENT-tracheostomy in place  Lungs-clear to auscultation bilaterally, no wheezing or crackles auscultated  Abdomen-soft, nontender, no organomegaly  Extremities-no edema in the lower extremities  Neuro-alert, oriented x3, no focal deficit noted    Data Reviewed:   Recent Results (from the past 240 hour(s))  Culture, respiratory (non-expectorated)     Status: None (Preliminary result)   Collection Time: 09/26/19  9:46 AM   Specimen: Tracheal Aspirate; Respiratory  Result Value Ref Range Status   Specimen Description TRACHEAL ASPIRATE  Final   Special Requests Normal  Final  Gram Stain   Final    RARE WBC PRESENT, PREDOMINANTLY PMN FEW GRAM NEGATIVE RODS FEW GRAM POSITIVE COCCI IN PAIRS    Culture   Final    FEW ESCHERICHIA COLI CULTURE REINCUBATED FOR BETTER GROWTH Performed at [facility], [address] [place], [place] [address]    Report Status PENDING  Incomplete   Organism ID, Bacteria ESCHERICHIA COLI  Final      Susceptibility   Escherichia coli - MIC*    AMPICILLIN <=2 SENSITIVE Sensitive     CEFAZOLIN <=4 SENSITIVE Sensitive     CEFEPIME <=0.12 SENSITIVE Sensitive     CEFTAZIDIME <=1 SENSITIVE Sensitive     CEFTRIAXONE <=0.25 SENSITIVE Sensitive     CIPROFLOXACIN 1 SENSITIVE Sensitive     GENTAMICIN <=1 SENSITIVE Sensitive     IMIPENEM <=0.25 SENSITIVE Sensitive     TRIMETH/SULFA <=20 SENSITIVE Sensitive     AMPICILLIN/SULBACTAM <=2  SENSITIVE Sensitive     PIP/TAZO <=4 SENSITIVE Sensitive     * FEW ESCHERICHIA COLI    No results for input(s): LIPASE, AMYLASE in the last 168 hours. No results for input(s): AMMONIA in the last 168 hours.  Cardiac Enzymes: No results for input(s): CKTOTAL, CKMB, CKMBINDEX, TROPONINI in the last 168 hours. BNP (last 3 results) Recent Labs    08/10/19 2150 08/13/19 0225 09/05/19 0735  BNP 318.9* 929.4* 2,477.5*     Oswald Hillock   [facility] If 7PM-7AM, please contact night-coverage at [email or web address], Office  [phone]   09/29/2019, 2:04 PM  LOS: 24 days

## 2019-09-29 NOTE — Progress Notes (Signed)
CPT not done this round. Pt is sitting up in chair.

## 2019-09-30 ENCOUNTER — Inpatient Hospital Stay (HOSPITAL_COMMUNITY): Payer: Medicare HMO

## 2019-09-30 LAB — COMPREHENSIVE METABOLIC PANEL
ALT: 11 U/L (ref 0–44)
AST: 17 U/L (ref 15–41)
Albumin: 2.5 g/dL — ABNORMAL LOW (ref 3.5–5.0)
Alkaline Phosphatase: 67 U/L (ref 38–126)
Anion gap: 10 (ref 5–15)
BUN: 34 mg/dL — ABNORMAL HIGH (ref 8–23)
CO2: 28 mmol/L (ref 22–32)
Calcium: 9.6 mg/dL (ref 8.9–10.3)
Chloride: 99 mmol/L (ref 98–111)
Creatinine, Ser: 0.72 mg/dL (ref 0.44–1.00)
GFR calc Af Amer: >60 mL/min (ref >60)
GFR calc non Af Amer: >60 mL/min (ref >60)
Glucose, Bld: 128 mg/dL — ABNORMAL HIGH (ref 70–99)
Potassium: 4.3 mmol/L (ref 3.5–5.1)
Sodium: 137 mmol/L (ref 135–145)
Total Bilirubin: 0.2 mg/dL — ABNORMAL LOW (ref 0.3–1.2)
Total Protein: 5.9 g/dL — ABNORMAL LOW (ref 6.5–8.1)

## 2019-09-30 LAB — CBC
HCT: 29 % — ABNORMAL LOW (ref 36.0–46.0)
Hemoglobin: 8.1 g/dL — ABNORMAL LOW (ref 12.0–15.0)
MCH: 26.6 pg (ref 26.0–34.0)
MCHC: 27.9 g/dL — ABNORMAL LOW (ref 30.0–36.0)
MCV: 95.1 fL (ref 80.0–100.0)
Platelets: 283 10*3/uL (ref 150–400)
RBC: 3.05 MIL/uL — ABNORMAL LOW (ref 3.87–5.11)
RDW: 20.1 % — ABNORMAL HIGH (ref 11.5–15.5)
WBC: 7 10*3/uL (ref 4.0–10.5)
nRBC: 0 % (ref 0.0–0.2)

## 2019-09-30 LAB — GLUCOSE, CAPILLARY
Glucose-Capillary: 112 mg/dL — ABNORMAL HIGH (ref 70–99)
Glucose-Capillary: 120 mg/dL — ABNORMAL HIGH (ref 70–99)
Glucose-Capillary: 124 mg/dL — ABNORMAL HIGH (ref 70–99)
Glucose-Capillary: 124 mg/dL — ABNORMAL HIGH (ref 70–99)
Glucose-Capillary: 134 mg/dL — ABNORMAL HIGH (ref 70–99)
Glucose-Capillary: 137 mg/dL — ABNORMAL HIGH (ref 70–99)
Glucose-Capillary: 99 mg/dL (ref 70–99)

## 2019-09-30 LAB — CULTURE, RESPIRATORY W GRAM STAIN: Special Requests: NORMAL

## 2019-09-30 MED ORDER — LORAZEPAM 2 MG/ML IJ SOLN
INTRAMUSCULAR | Status: AC
  Administered 2019-09-30: 1 mg via INTRAVENOUS
  Filled 2019-09-30: qty 1

## 2019-09-30 MED ORDER — LORAZEPAM 2 MG/ML IJ SOLN: 1.0000 mg | Freq: Once | INTRAMUSCULAR | Status: AC

## 2019-09-30 MED ORDER — OXYCODONE HCL 5 MG PO TABS
10.0000 mg | ORAL_TABLET | Freq: Four times a day (QID) | ORAL | Status: DC | PRN
  Administered 2019-09-30 – 2019-10-04 (×15): 10 mg
  Filled 2019-09-30 (×15): qty 2

## 2019-09-30 MED ORDER — PREDNISONE 20 MG PO TABS
20.0000 mg | ORAL_TABLET | Freq: Every day | ORAL | Status: DC
  Administered 2019-09-30 – 2019-10-01 (×2): 20 mg via ORAL
  Filled 2019-09-30 (×3): qty 1

## 2019-09-30 MED ORDER — LORAZEPAM 2 MG/ML IJ SOLN
1.0000 mg | INTRAMUSCULAR | Status: DC | PRN
  Administered 2019-09-30 – 2019-10-02 (×5): 1 mg via INTRAVENOUS
  Filled 2019-09-30 (×7): qty 1

## 2019-09-30 NOTE — Plan of Care (Signed)
  Problem: Education: Goal: Knowledge of General Education information will improve Description: Including pain rating scale, medication(s)/side effects and non-pharmacologic comfort measures Outcome: Progressing   Problem: Health Behavior/Discharge Planning: Goal: Ability to manage health-related needs will improve Outcome: Not Progressing   Problem: Clinical Measurements: Goal: Ability to maintain clinical measurements within normal limits will improve Outcome: Not Progressing Goal: Will remain free from infection Outcome: Progressing Goal: Diagnostic test results will improve Outcome: Progressing Goal: Respiratory complications will improve Outcome: Not Progressing Goal: Cardiovascular complication will be avoided Outcome: Progressing   Problem: Activity: Goal: Risk for activity intolerance will decrease Outcome: Progressing   Problem: Nutrition: Goal: Adequate nutrition will be maintained Outcome: Progressing   Problem: Coping: Goal: Level of anxiety will decrease Outcome: Not Progressing   Problem: Elimination: Goal: Will not experience complications related to bowel motility Outcome: Progressing Goal: Will not experience complications related to urinary retention Outcome: Progressing   Problem: Pain Managment: Goal: General experience of comfort will improve Outcome: Not Progressing   Problem: Safety: Goal: Ability to remain free from injury will improve Outcome: Progressing   Problem: Skin Integrity: Goal: Risk for impaired skin integrity will decrease Outcome: Not Progressing

## 2019-09-30 NOTE — Progress Notes (Signed)
[facility]  PROGRESS NOTE  Chelsea Allison BTY:[id number] DOB: [birth year] DOA: 09/05/2019 PCP: Beckie Salts, MD   Brief HPI:   64 year old female with past medical history of CAD s/p PCI in October 2020, COPD, dyslipidemia, hypertension, non-small cell lung cancer stage III status post partial lung resection, anal cancer status post colectomy was admitted to the hospital with acute on chronic hypoxic/hypercapnic respiratory failure from pneumonia and COPD.  Patient is status post tracheostomy.  She is currently requiring mechanical ventilation.  PCCM is following.    Subjective   Patient seen and examined, became anxious this morning.  Required Ativan.  Feels better at this time.   Assessment/Plan:     1. Acute on chronic hypoxemic/hypercapnic respiratory failure-secondary to COPD exacerbation, right upper lobe pneumonia.  Patient underwent tracheostomy on 09/12/2019 for inability to wean off ventilator and repeated intubations..  Patient received antibiotic course for E. coli/Klebsiella pneumonia.  Currently patient is requiring as needed mechanical ventilation.  PCCM following 2. COPD exacerbation-patient was transferred out of ICU and to ICU on 09/25/2019 after mucous plugging event.  Continue bronchodilators, chest PT, wean ventilator as tolerated.  Started on prednisone 20 mg daily for 5 doses by PCCM. 3. Chronic cancer-related pain/anxiety-continue oxycodone 10 mg every 8 hours, buprenorphine patch, Seroquel 12.5 mg p.o. twice daily. 4. Chronic HFrEF-patient has history of CAD, EF 35 to 40% on echo from 08/12/2019.  Currently not requiring diuretics.  Continue aspirin, Brilinta. 5. Anxiety/agitation-patient became anxious this morning, she is currently on vent.  Will start Ativan 1 mg IV every 4 hours as needed for agitation/anxiety. 6. Diabetes mellitus type 2-blood sugar stable, continue sliding scale insulin with NovoLog.  CBG well controlled. 7. History of non-small cell lung  cancer-status post partial lung resection.  Follow-up oncology as outpatient. 8. History of rectal cancer s/p colectomy-stable 9. Iron deficiency anemia-hemoglobin is stable, transfuse for hemoglobin less than 7.  No clear source of bleeding.    Scheduled medications:   . arformoterol  15 mcg Nebulization BID  . vitamin C  250 mg Per Tube BID  . aspirin  81 mg Per Tube Daily  . buprenorphine  2 patch Transdermal Weekly  . chlorhexidine gluconate (MEDLINE KIT)  15 mL Mouth Rinse BID  . Chlorhexidine Gluconate Cloth  6 each Topical Daily  . clonazePAM  0.5 mg Per Tube BID  . collagenase   Topical Daily  . docusate  100 mg Per Tube BID  . enoxaparin (LOVENOX) injection  40 mg Subcutaneous Q24H  . feeding supplement (PROSource TF)  45 mL Per Tube TID  . free water  200 mL Per Tube Q4H  . guaiFENesin  15 mL Per Tube Q6H  . insulin aspart  0-15 Units Subcutaneous Q4H  . mouth rinse  15 mL Mouth Rinse 10 times per day  . pantoprazole sodium  40 mg Per Tube Daily  . polyethylene glycol  17 g Per Tube Daily  . predniSONE  20 mg Oral Q breakfast  . QUEtiapine  12.5 mg Per Tube BID  . revefenacin  175 mcg Nebulization Daily  . rosuvastatin  20 mg Per Tube Daily  . ticagrelor  90 mg Per Tube BID  . vitamin B-12  1,000 mcg Per Tube QODAY  . Vitamin D (Ergocalciferol)  50,000 Units Per Tube Q7 days  . zinc sulfate  220 mg Per Tube Daily     Antibiotics:   Anti-infectives (From admission, onward)   Start     Dose/Rate Route Frequency  Ordered Stop   09/29/19 1800  ceFAZolin (ANCEF) IVPB 2g/100 mL premix     Discontinue     2 g 200 mL/hr over 30 Minutes Intravenous Every 8 hours 09/29/19 0915 10/05/19 0159   09/28/19 1630  cefTRIAXone (ROCEPHIN) 1 g in sodium chloride 0.9 % 100 mL IVPB  Status:  Discontinued        1 g 200 mL/hr over 30 Minutes Intravenous Every 24 hours 09/28/19 1630 09/29/19 0915   09/18/19 1000  ceFAZolin (ANCEF) IVPB 2g/100 mL premix        2 g 200 mL/hr over 30  Minutes Intravenous Every 8 hours 09/18/19 0933 09/25/19 0406   09/06/19 0830  fluconazole (DIFLUCAN) tablet 150 mg        150 mg Per Tube  Once 09/06/19 0743 09/06/19 0834   09/05/19 2000  vancomycin (VANCOCIN) IVPB 1000 mg/200 mL premix  Status:  Discontinued        1,000 mg 200 mL/hr over 60 Minutes Intravenous Every 12 hours 09/05/19 1048 09/06/19 0743   09/05/19 1400  piperacillin-tazobactam (ZOSYN) IVPB 3.375 g  Status:  Discontinued        3.375 g 12.5 mL/hr over 240 Minutes Intravenous Every 8 hours 09/05/19 1048 09/09/19 1159   09/05/19 0830  vancomycin (VANCOCIN) IVPB 1000 mg/200 mL premix        1,000 mg 200 mL/hr over 60 Minutes Intravenous  Once 09/05/19 0827 09/05/19 1033   09/05/19 0830  piperacillin-tazobactam (ZOSYN) IVPB 3.375 g        3.375 g 12.5 mL/hr over 240 Minutes Intravenous  Once 09/05/19 0827 09/05/19 1117      CBG: Recent Labs  Lab 09/29/19 1935 09/30/19 0001 09/30/19 0347 09/30/19 0803 09/30/19 1158  GLUCAP 120* 99 124* 124* 120*    SpO2: 100 % O2 Flow Rate (L/min): 8 L/min FiO2 (%): 80 %    CBC: Recent Labs  Lab 09/24/19 0317 09/26/19 0142 09/26/19 0505 09/27/19 0912 09/30/19 0453  WBC 7.1  --  12.1* 8.0 7.0  NEUTROABS 5.0  --  9.5*  --   --   HGB 8.5* 11.6* 9.2* 8.9* 8.1*  HCT 30.5* 34.0* 32.4* 32.1* 29.0*  MCV 93.6  --  93.9 95.3 95.1  PLT 372  --  383 332 625    Basic Metabolic Panel: Recent Labs  Lab 09/24/19 0317 09/26/19 0142 09/26/19 0505 09/27/19 0912 09/30/19 0453  NA 140 140 139 140 137  K 3.7 3.8 4.2 4.4 4.3  CL 97*  --  100 100 99  CO2 33*  --  '28 31 28  ' GLUCOSE 152*  --  133* 149* 128*  BUN 28*  --  32* 31* 34*  CREATININE 0.68  --  0.85 0.56 0.72  CALCIUM 9.4  --  9.6 10.0 9.6     Liver Function Tests: Recent Labs  Lab 09/26/19 0505 09/30/19 0453  AST 18 17  ALT 8 11  ALKPHOS 77 67  BILITOT 0.4 0.2*  PROT 6.6 5.9*  ALBUMIN 2.5* 2.5*      DVT prophylaxis: Lovenox  Code Status: Full  code  Family Communication: No family at bedside    Status is: Inpatient  Dispo: The patient is from: Home              Anticipated d/c is to: Skilled nursing facility              Anticipated d/c date is: 10/04/2019  Patient currently not medically stable for discharge, requiring mechanical ventilation  Barrier to discharge-currently requiring mechanical ventilation, not ready for discharge.  Pressure Injury 09/07/19 Sacrum Unstageable - Full thickness tissue loss in which the base of the injury is covered by slough (yellow, tan, gray, green or brown) and/or eschar (tan, brown or black) in the wound bed. (Active)  09/07/19 1000  Location: Sacrum  Location Orientation:   Staging: Unstageable - Full thickness tissue loss in which the base of the injury is covered by slough (yellow, tan, gray, green or brown) and/or eschar (tan, brown or black) in the wound bed.  Wound Description (Comments):   Present on Admission: Yes     Pressure Injury 09/10/19 Sacrum Lower Stage 3 -  Full thickness tissue loss. Subcutaneous fat may be visible but bone, tendon or muscle are NOT exposed. 1x1x1 (Active)  09/10/19 1355  Location: Sacrum  Location Orientation: Lower  Staging: Stage 3 -  Full thickness tissue loss. Subcutaneous fat may be visible but bone, tendon or muscle are NOT exposed.  Wound Description (Comments): 1x1x1  Present on Admission:      Pressure Injury 09/10/19 Lip Left skin breakdown from ETT (Active)  09/10/19 0700  Location: Lip  Location Orientation: Left  Staging:   Wound Description (Comments): skin breakdown from ETT  Present on Admission:     Consultants:  PCCM  Procedures:  S/p tracheostomy   Objective   Vitals:   09/30/19 1330 09/30/19 1345 09/30/19 1400 09/30/19 1415  BP:      Pulse: 75 73 76 76  Resp: (!) 27 (!) 24 20 (!) 24  Temp:      TempSrc:      SpO2: 100% 100% 100% 100%  Weight:      Height:        Intake/Output Summary (Last 24  hours) at 09/30/2019 1433 Last data filed at 09/30/2019 1400 Gross per 24 hour  Intake 3360 ml  Output 1500 ml  Net 1860 ml    07/22 1901 - 07/24 0700 In: 3360 [I.V.:40] Out: 1300 [Urine:1150]  Filed Weights   09/20/19 0500 09/25/19 0122 09/27/19 0600  Weight: 79.5 kg 82.1 kg 82.7 kg    Physical Examination:    General-appears in no acute distress  Heart-S1-S2, regular, no murmur auscultated  HEENT-tracheostomy intact  Lungs-clear to auscultation bilaterally, no wheezing or crackles auscultated  Abdomen-soft, nontender, no organomegaly  Extremities-no edema in the lower extremities  Neuro-alert, oriented x3, no focal deficit noted    Data Reviewed:   Recent Results (from the past 240 hour(s))  Culture, respiratory (non-expectorated)     Status: None   Collection Time: 09/26/19  9:46 AM   Specimen: Tracheal Aspirate; Respiratory  Result Value Ref Range Status   Specimen Description TRACHEAL ASPIRATE  Final   Special Requests Normal  Final   Gram Stain   Final    RARE WBC PRESENT, PREDOMINANTLY PMN FEW GRAM NEGATIVE RODS FEW GRAM POSITIVE COCCI IN PAIRS Performed at [facility], [address], [place], [place] [address]    Culture FEW ESCHERICHIA COLI  Final   Report Status 09/30/2019 FINAL  Final   Organism ID, Bacteria ESCHERICHIA COLI  Final      Susceptibility   Escherichia coli - MIC*    AMPICILLIN <=2 SENSITIVE Sensitive     CEFAZOLIN <=4 SENSITIVE Sensitive     CEFEPIME <=0.12 SENSITIVE Sensitive     CEFTAZIDIME <=1 SENSITIVE Sensitive     CEFTRIAXONE <=0.25 SENSITIVE  Sensitive     CIPROFLOXACIN 1 SENSITIVE Sensitive     GENTAMICIN <=1 SENSITIVE Sensitive     IMIPENEM <=0.25 SENSITIVE Sensitive     TRIMETH/SULFA <=20 SENSITIVE Sensitive     AMPICILLIN/SULBACTAM <=2 SENSITIVE Sensitive     PIP/TAZO <=4 SENSITIVE Sensitive     * FEW ESCHERICHIA COLI    No results for input(s): LIPASE, AMYLASE in the last 168 hours. No results for  input(s): AMMONIA in the last 168 hours.  Cardiac Enzymes: No results for input(s): CKTOTAL, CKMB, CKMBINDEX, TROPONINI in the last 168 hours. BNP (last 3 results) Recent Labs    08/10/19 2150 08/13/19 0225 09/05/19 0735  BNP 318.9* 929.4* 2,477.5*     Oswald Hillock   [facility] If 7PM-7AM, please contact night-coverage at [email or web address], Office  [phone]   09/30/2019, 2:33 PM  LOS: 25 days

## 2019-09-30 NOTE — Progress Notes (Signed)
NAME:  Chelsea Allison, MRN:  [id number], DOB:  [birth year], LOS: 25 ADMISSION DATE:  09/05/2019, CONSULTATION DATE:  6/29 REFERRING MD:  Stark Jock, CHIEF COMPLAINT:  Dyspnea   Brief History   64 yo female smoker presented to ED with altered mental status from acute on chronic hypoxic/hypercapnic respiratory failure 2nd to HCAP in setting of COPD. Has had prolonged mechanical ventilation, required tracheostomy.  Past Medical History  NSCLC s/p partial resection Anal cancer s/p colostomy CAD with MI s/p PCI COPD on home O2 HFrEF Tobacco abuse    Significant Hospital Events   6/29 admitted to [facility]  7/1 hgb drop.  7/2 SBTx4h 7/4 only tolerated 22min on pressure support. Starting precedex/klonopin. Obtaining hemolytic labs. 7/6 trached 7/9: Copious secretions still noted.  Tachypneic.  Mildly hypotensive.  Presuming anxiety complicating weaning efforts titration of anxiolytic regimen performed, starting aerosol trach collar trials 7/10: Was on trach collar, pulse oximetry dropping to 40s supplemental oxygen increase but still on the needed achieve 70% FiO2 placed back on ventilator lavaged for pink-tan secretions saturations improved back on vent, checking sputum culture and chest x-ray. 7/11 looks better. Working on weaning  7/19 transfer back to ICU after severe desaturations and also hypercarbic failure.  7/21 no overnight events 7/22 on vent for desaturation to 60s with mucus plugging event  Consults:  PCCM  Procedures:  ETT 6/29-7/6 Tracheostomy 7/6  Significant Diagnostic Tests:    Micro Data:  6/29 SARS COV 2 > neg 6/29 urine culture > insignificant growth 6/29 blood > negative 7/10 resp culture > e coli, klebsiella pneumoniae 7/20 resp culture > E coli  Antimicrobials:  6/29 vancomycin>6/30 6/29 pip/tazo> 7/3 Ancef 7/12 -7/18 CTX 7/22-7/23 Ancef 7/23>   Interim history/subjective:  Anxious this morning, tachypnea, wheezing  Objective   Blood pressure (!) 98/62,  pulse 75, temperature 98.6 F (37 C), temperature source Oral, resp. rate (!) 24, height 5\' 9"  (1.753 m), weight 82.7 kg, SpO2 98 %.    Vent Mode: PRVC FiO2 (%):  [35 %-40 %] 40 % Set Rate:  [24 bmp] 24 bmp Vt Set:  [530 mL] 530 mL PEEP:  [5 cmH20] 5 cmH20 Plateau Pressure:  [20 cmH20-26 cmH20] 26 cmH20   Intake/Output Summary (Last 24 hours) at 09/30/2019 0817 Last data filed at 09/30/2019 0700 Gross per 24 hour  Intake 2440 ml  Output 1000 ml  Net 1440 ml   Filed Weights   09/20/19 0500 09/25/19 0122 09/27/19 0600  Weight: 79.5 kg 82.1 kg 82.7 kg    Examination: General:  Very anxious, tachypnea in bed HENT: NCAT OP clear, tracheostomy clean, intact PULM: Wheezing bilaterally B, increased effort, vent supported CV: RRR, no mgr GI: BS+, soft, nontender MSK: normal bulk and tone Neuro: awake, alert, [facility] Problem list     Assessment & Plan:  Acute on chronic respiratory failure with hypercarbia and hypoxemia COPD s/p acute exacerbation, now worsening wheezing Above complicated by anxiety Tracheostomy status Continue brovana/pulmicort Add prednisone 20mg  daily x 5 days Continue SABA as ordered Continue pressure support as tolerated during daytime Pulm toilette Prn ativan   Best practice:   Per [facility]  Labs   CBC: Recent Labs  Lab 09/24/19 0317 09/26/19 0142 09/26/19 0505 09/27/19 0912 09/30/19 0453  WBC 7.1  --  12.1* 8.0 7.0  NEUTROABS 5.0  --  9.5*  --   --   HGB 8.5* 11.6* 9.2* 8.9* 8.1*  HCT 30.5* 34.0* 32.4* 32.1* 29.0*  MCV 93.6  --  93.9 95.3 95.1  PLT 372  --  383 332 818    Basic Metabolic Panel: Recent Labs  Lab 09/24/19 0317 09/26/19 0142 09/26/19 0505 09/27/19 0912 09/30/19 0453  NA 140 140 139 140 137  K 3.7 3.8 4.2 4.4 4.3  CL 97*  --  100 100 99  CO2 33*  --  28 31 28   GLUCOSE 152*  --  133* 149* 128*  BUN 28*  --  32* 31* 34*  CREATININE 0.68  --  0.85 0.56 0.72  CALCIUM 9.4  --  9.6 10.0 9.6    GFR: Estimated Creatinine Clearance: 82.7 mL/min (by C-G formula based on SCr of 0.72 mg/dL). Recent Labs  Lab 09/24/19 0317 09/26/19 0505 09/27/19 0912 09/30/19 0453  WBC 7.1 12.1* 8.0 7.0    Liver Function Tests: Recent Labs  Lab 09/26/19 0505 09/30/19 0453  AST 18 17  ALT 8 11  ALKPHOS 77 67  BILITOT 0.4 0.2*  PROT 6.6 5.9*  ALBUMIN 2.5* 2.5*   No results for input(s): LIPASE, AMYLASE in the last 168 hours. No results for input(s): AMMONIA in the last 168 hours.  ABG    Component Value Date/Time   PHART 7.363 09/26/2019 0142   PCO2ART 52.4 (H) 09/26/2019 0142   PO2ART 68 (L) 09/26/2019 0142   HCO3 29.7 (H) 09/26/2019 0142   TCO2 31 09/26/2019 0142   ACIDBASEDEF 1.0 08/15/2019 1944   O2SAT 92.0 09/26/2019 0142     Coagulation Profile: No results for input(s): INR, PROTIME in the last 168 hours.  Cardiac Enzymes: No results for input(s): CKTOTAL, CKMB, CKMBINDEX, TROPONINI in the last 168 hours.  HbA1C: Hgb A1c MFr Bld  Date/Time Value Ref Range Status  08/11/2019 08:00 AM 5.8 (H) 4.8 - 5.6 % Final    Comment:    (NOTE) Pre diabetes:          5.7%-6.4% Diabetes:              >6.4% Glycemic control for   <7.0% adults with diabetes   01/10/2019 04:12 AM 5.7 (H) 4.8 - 5.6 % Final    Comment:    (NOTE) Pre diabetes:          5.7%-6.4% Diabetes:              >6.4% Glycemic control for   <7.0% adults with diabetes     CBG: Recent Labs  Lab 09/29/19 1636 09/29/19 1935 09/30/19 0001 09/30/19 0347 09/30/19 0803  GLUCAP 108* 120* 99 124* 124*       Critical care time: n/a     Roselie Awkward, MD [place] [facility] Pager: [phone] Cell: [phone] If no response, call [phone]

## 2019-10-01 LAB — BASIC METABOLIC PANEL
Anion gap: 9 (ref 5–15)
Anion gap: 7 (ref 5–15)
BUN: 30 mg/dL — ABNORMAL HIGH (ref 8–23)
BUN: 32 mg/dL — ABNORMAL HIGH (ref 8–23)
CO2: 28 mmol/L (ref 22–32)
CO2: 28 mmol/L (ref 22–32)
Calcium: 9.6 mg/dL (ref 8.9–10.3)
Calcium: 9.5 mg/dL (ref 8.9–10.3)
Chloride: 99 mmol/L (ref 98–111)
Chloride: 101 mmol/L (ref 98–111)
Creatinine, Ser: 0.69 mg/dL (ref 0.44–1.00)
Creatinine, Ser: 0.62 mg/dL (ref 0.44–1.00)
GFR calc Af Amer: >60 mL/min (ref >60)
GFR calc Af Amer: >60 mL/min (ref >60)
GFR calc non Af Amer: >60 mL/min (ref >60)
GFR calc non Af Amer: >60 mL/min (ref >60)
Glucose, Bld: 139 mg/dL — ABNORMAL HIGH (ref 70–99)
Glucose, Bld: 132 mg/dL — ABNORMAL HIGH (ref 70–99)
Potassium: 3.6 mmol/L (ref 3.5–5.1)
Potassium: 4 mmol/L (ref 3.5–5.1)
Sodium: 136 mmol/L (ref 135–145)
Sodium: 136 mmol/L (ref 135–145)

## 2019-10-01 LAB — CBC
HCT: 27.8 % — ABNORMAL LOW (ref 36.0–46.0)
Hemoglobin: 8.2 g/dL — ABNORMAL LOW (ref 12.0–15.0)
MCH: 28 pg (ref 26.0–34.0)
MCHC: 29.5 g/dL — ABNORMAL LOW (ref 30.0–36.0)
MCV: 94.9 fL (ref 80.0–100.0)
Platelets: 288 10*3/uL (ref 150–400)
RBC: 2.93 MIL/uL — ABNORMAL LOW (ref 3.87–5.11)
RDW: 19.3 % — ABNORMAL HIGH (ref 11.5–15.5)
WBC: 8.1 10*3/uL (ref 4.0–10.5)
nRBC: 0 % (ref 0.0–0.2)

## 2019-10-01 LAB — GLUCOSE, CAPILLARY
Glucose-Capillary: 117 mg/dL — ABNORMAL HIGH (ref 70–99)
Glucose-Capillary: 116 mg/dL — ABNORMAL HIGH (ref 70–99)
Glucose-Capillary: 148 mg/dL — ABNORMAL HIGH (ref 70–99)
Glucose-Capillary: 125 mg/dL — ABNORMAL HIGH (ref 70–99)
Glucose-Capillary: 101 mg/dL — ABNORMAL HIGH (ref 70–99)

## 2019-10-01 MED ORDER — PREDNISONE 20 MG PO TABS
20.0000 mg | ORAL_TABLET | Freq: Every day | ORAL | Status: DC
  Administered 2019-10-02: 20 mg
  Filled 2019-10-01: qty 1

## 2019-10-01 MED ORDER — POTASSIUM CHLORIDE 20 MEQ/15ML (10%) PO SOLN
40.0000 meq | Freq: Once | ORAL | Status: AC
  Administered 2019-10-01: 40 meq
  Filled 2019-10-01: qty 30

## 2019-10-01 NOTE — Progress Notes (Signed)
[facility]  PROGRESS NOTE  Dean Wonder JKK:[id number] DOB: Nov 30, 1955 DOA: 09/05/2019 PCP: Beckie Salts, MD   Brief HPI:   64 year old female with past medical history of CAD s/p PCI in October 2020, COPD, dyslipidemia, hypertension, non-small cell lung cancer stage III status post partial lung resection, anal cancer status post colectomy was admitted to the hospital with acute on chronic hypoxic/hypercapnic respiratory failure from pneumonia and COPD.  Patient is status post tracheostomy.  She is currently requiring mechanical ventilation.  PCCM is following.    Subjective   Patient seen and examined, anxiety is better after starting as needed Ativan.   Assessment/Plan:     1. Acute on chronic hypoxemic/hypercapnic respiratory failure-secondary to COPD exacerbation, right upper lobe pneumonia.  Patient underwent tracheostomy on 09/12/2019 for inability to wean off ventilator and repeated intubations..  Patient received antibiotic course for E. coli/Klebsiella pneumonia.  Currently patient is requiring as needed mechanical ventilation.  PCCM following 2. COPD exacerbation-patient was transferred out of ICU and to ICU on 09/25/2019 after mucous plugging event.  Continue bronchodilators, chest PT, wean ventilator as tolerated.  Started on prednisone 20 mg daily for 5 doses by PCCM. 3. Chronic cancer-related pain/anxiety-continue oxycodone 10 mg every 8 hours, buprenorphine patch, Seroquel 12.5 mg p.o. twice daily. 4. Chronic HFrEF-patient has history of CAD, EF 35 to 40% on echo from 08/12/2019.  Currently not requiring diuretics.  Continue aspirin, Brilinta. 5. Anxiety/agitation-patient became anxious this morning, she is currently on vent.  Continue  Ativan 1 mg IV every 4 hours as needed for agitation/anxiety. 6. Diabetes mellitus type 2-blood sugar stable, continue sliding scale insulin with NovoLog.  CBG well controlled. 7. History of non-small cell lung cancer-status post partial lung  resection.  Follow-up oncology as outpatient. 8. History of rectal cancer s/p colectomy-stable 9. Iron deficiency anemia-hemoglobin is stable, transfuse for hemoglobin less than 7.  No clear source of bleeding.    Scheduled medications:   . arformoterol  15 mcg Nebulization BID  . vitamin C  250 mg Per Tube BID  . aspirin  81 mg Per Tube Daily  . buprenorphine  2 patch Transdermal Weekly  . chlorhexidine gluconate (MEDLINE KIT)  15 mL Mouth Rinse BID  . Chlorhexidine Gluconate Cloth  6 each Topical Daily  . clonazePAM  0.5 mg Per Tube BID  . collagenase   Topical Daily  . docusate  100 mg Per Tube BID  . enoxaparin (LOVENOX) injection  40 mg Subcutaneous Q24H  . feeding supplement (PROSource TF)  45 mL Per Tube TID  . free water  200 mL Per Tube Q4H  . guaiFENesin  15 mL Per Tube Q6H  . insulin aspart  0-15 Units Subcutaneous Q4H  . mouth rinse  15 mL Mouth Rinse 10 times per day  . pantoprazole sodium  40 mg Per Tube Daily  . polyethylene glycol  17 g Per Tube Daily  . [START ON 10/02/2019] potassium chloride  40 mEq Per Tube Once  . [START ON 10/02/2019] predniSONE  20 mg Per Tube Q breakfast  . QUEtiapine  12.5 mg Per Tube BID  . revefenacin  175 mcg Nebulization Daily  . rosuvastatin  20 mg Per Tube Daily  . ticagrelor  90 mg Per Tube BID  . vitamin B-12  1,000 mcg Per Tube QODAY  . Vitamin D (Ergocalciferol)  50,000 Units Per Tube Q7 days  . zinc sulfate  220 mg Per Tube Daily     Antibiotics:   Anti-infectives (From  admission, onward)   Start     Dose/Rate Route Frequency Ordered Stop   09/29/19 1800  ceFAZolin (ANCEF) IVPB 2g/100 mL premix     Discontinue     2 g 200 mL/hr over 30 Minutes Intravenous Every 8 hours 09/29/19 0915 10/05/19 0159   09/28/19 1630  cefTRIAXone (ROCEPHIN) 1 g in sodium chloride 0.9 % 100 mL IVPB  Status:  Discontinued        1 g 200 mL/hr over 30 Minutes Intravenous Every 24 hours 09/28/19 1630 09/29/19 0915   09/18/19 1000  ceFAZolin  (ANCEF) IVPB 2g/100 mL premix        2 g 200 mL/hr over 30 Minutes Intravenous Every 8 hours 09/18/19 0933 09/25/19 0406   09/06/19 0830  fluconazole (DIFLUCAN) tablet 150 mg        150 mg Per Tube  Once 09/06/19 0743 09/06/19 0834   09/05/19 2000  vancomycin (VANCOCIN) IVPB 1000 mg/200 mL premix  Status:  Discontinued        1,000 mg 200 mL/hr over 60 Minutes Intravenous Every 12 hours 09/05/19 1048 09/06/19 0743   09/05/19 1400  piperacillin-tazobactam (ZOSYN) IVPB 3.375 g  Status:  Discontinued        3.375 g 12.5 mL/hr over 240 Minutes Intravenous Every 8 hours 09/05/19 1048 09/09/19 1159   09/05/19 0830  vancomycin (VANCOCIN) IVPB 1000 mg/200 mL premix        1,000 mg 200 mL/hr over 60 Minutes Intravenous  Once 09/05/19 0827 09/05/19 1033   09/05/19 0830  piperacillin-tazobactam (ZOSYN) IVPB 3.375 g        3.375 g 12.5 mL/hr over 240 Minutes Intravenous  Once 09/05/19 0827 09/05/19 1117      CBG: Recent Labs  Lab 09/30/19 1937 09/30/19 2316 10/01/19 0349 10/01/19 0759 10/01/19 1149  GLUCAP 134* 112* 117* 116* 148*    SpO2: 99 % O2 Flow Rate (L/min): 8 L/min FiO2 (%): 40 %    CBC: Recent Labs  Lab 09/26/19 0142 09/26/19 0505 09/27/19 0912 09/30/19 0453 10/01/19 0310  WBC  --  12.1* 8.0 7.0 8.1  NEUTROABS  --  9.5*  --   --   --   HGB 11.6* 9.2* 8.9* 8.1* 8.2*  HCT 34.0* 32.4* 32.1* 29.0* 27.8*  MCV  --  93.9 95.3 95.1 94.9  PLT  --  383 332 283 932    Basic Metabolic Panel: Recent Labs  Lab 09/26/19 0505 09/27/19 0912 09/30/19 0453 10/01/19 0310 10/01/19 0603  NA 139 140 137 136 136  K 4.2 4.4 4.3 3.6 4.0  CL 100 100 99 99 101  CO2 '28 31 28 28 28  ' GLUCOSE 133* 149* 128* 139* 132*  BUN 32* 31* 34* 30* 32*  CREATININE 0.85 0.56 0.72 0.69 0.62  CALCIUM 9.6 10.0 9.6 9.6 9.5     Liver Function Tests: Recent Labs  Lab 09/26/19 0505 09/30/19 0453  AST 18 17  ALT 8 11  ALKPHOS 77 67  BILITOT 0.4 0.2*  PROT 6.6 5.9*  ALBUMIN 2.5* 2.5*       DVT prophylaxis: Lovenox  Code Status: Full code  Family Communication: No family at bedside    Status is: Inpatient  Dispo: The patient is from: Home              Anticipated d/c is to: Skilled nursing facility              Anticipated d/c date is: 10/04/2019  Patient currently not medically stable for discharge, requiring mechanical ventilation  Barrier to discharge-currently requiring mechanical ventilation, not ready for discharge.  Pressure Injury 09/07/19 Sacrum Unstageable - Full thickness tissue loss in which the base of the injury is covered by slough (yellow, tan, gray, green or brown) and/or eschar (tan, brown or black) in the wound bed. (Active)  09/07/19 1000  Location: Sacrum  Location Orientation:   Staging: Unstageable - Full thickness tissue loss in which the base of the injury is covered by slough (yellow, tan, gray, green or brown) and/or eschar (tan, brown or black) in the wound bed.  Wound Description (Comments):   Present on Admission: Yes     Pressure Injury 09/10/19 Sacrum Lower Stage 3 -  Full thickness tissue loss. Subcutaneous fat may be visible but bone, tendon or muscle are NOT exposed. 1x1x1 (Active)  09/10/19 1355  Location: Sacrum  Location Orientation: Lower  Staging: Stage 3 -  Full thickness tissue loss. Subcutaneous fat may be visible but bone, tendon or muscle are NOT exposed.  Wound Description (Comments): 1x1x1  Present on Admission:      Pressure Injury 09/10/19 Lip Left skin breakdown from ETT (Active)  09/10/19 0700  Location: Lip  Location Orientation: Left  Staging:   Wound Description (Comments): skin breakdown from ETT  Present on Admission:     Consultants:  PCCM  Procedures:  S/p tracheostomy   Objective   Vitals:   10/01/19 1200 10/01/19 1208 10/01/19 1230 10/01/19 1300  BP: 102/68   107/68  Pulse: 81  97 88  Resp: 22  (!) 33 23  Temp:      TempSrc:      SpO2: 99% 100% 99% 99%  Weight:       Height:        Intake/Output Summary (Last 24 hours) at 10/01/2019 1406 Last data filed at 10/01/2019 1300 Gross per 24 hour  Intake 3120.06 ml  Output 2600 ml  Net 520.06 ml    07/23 1901 - 07/25 0700 In: 4360  Out: 2700 [Urine:2150]  Filed Weights   09/20/19 0500 09/25/19 0122 09/27/19 0600  Weight: 79.5 kg 82.1 kg 82.7 kg    Physical Examination:    General-appears in no acute distress  Heart-S1-S2, regular, no murmur auscultated  HEENT-tracheostomy intact  Lungs-clear to auscultation bilaterally, no wheezing or crackles auscultated  Abdomen-soft, nontender, no organomegaly  Extremities-no edema in the lower extremities  Neuro-alert, oriented x3, no focal deficit noted    Data Reviewed:   Recent Results (from the past 240 hour(s))  Culture, respiratory (non-expectorated)     Status: None   Collection Time: 09/26/19  9:46 AM   Specimen: Tracheal Aspirate; Respiratory  Result Value Ref Range Status   Specimen Description TRACHEAL ASPIRATE  Final   Special Requests Normal  Final   Gram Stain   Final    RARE WBC PRESENT, PREDOMINANTLY PMN FEW GRAM NEGATIVE RODS FEW GRAM POSITIVE COCCI IN PAIRS Performed at [facility], [place] [address] [place], [place] [address]    Culture FEW ESCHERICHIA COLI  Final   Report Status 09/30/2019 FINAL  Final   Organism ID, Bacteria ESCHERICHIA COLI  Final      Susceptibility   Escherichia coli - MIC*    AMPICILLIN <=2 SENSITIVE Sensitive     CEFAZOLIN <=4 SENSITIVE Sensitive     CEFEPIME <=0.12 SENSITIVE Sensitive     CEFTAZIDIME <=1 SENSITIVE Sensitive     CEFTRIAXONE <=0.25 SENSITIVE Sensitive  CIPROFLOXACIN 1 SENSITIVE Sensitive     GENTAMICIN <=1 SENSITIVE Sensitive     IMIPENEM <=0.25 SENSITIVE Sensitive     TRIMETH/SULFA <=20 SENSITIVE Sensitive     AMPICILLIN/SULBACTAM <=2 SENSITIVE Sensitive     PIP/TAZO <=4 SENSITIVE Sensitive     * FEW ESCHERICHIA COLI    No results for input(s): LIPASE,  AMYLASE in the last 168 hours. No results for input(s): AMMONIA in the last 168 hours.  Cardiac Enzymes: No results for input(s): CKTOTAL, CKMB, CKMBINDEX, TROPONINI in the last 168 hours. BNP (last 3 results) Recent Labs    08/10/19 2150 08/13/19 0225 09/05/19 0735  BNP 318.9* 929.4* 2,477.5*     Oswald Hillock   [facility] If 7PM-7AM, please contact night-coverage at [email or web address], Office  [phone]   10/01/2019, 2:06 PM  LOS: 26 days

## 2019-10-01 NOTE — Progress Notes (Signed)
NAME:  Chelsea Allison, MRN:  [id number], DOB:  1955/07/09, LOS: 72 ADMISSION DATE:  09/05/2019, CONSULTATION DATE:  6/29 REFERRING MD:  Stark Jock, CHIEF COMPLAINT:  Dyspnea   Brief History   64 yo female smoker presented to ED with altered mental status from acute on chronic hypoxic/hypercapnic respiratory failure 2nd to HCAP in setting of COPD. Has had prolonged mechanical ventilation, required tracheostomy.  Past Medical History  NSCLC s/p partial resection Anal cancer s/p colostomy CAD with MI s/p PCI COPD on home O2 HFrEF Tobacco abuse   Significant Hospital Events   6/29 admitted to [facility]  7/1 hgb drop.  7/2 SBTx4h 7/4 only tolerated 26min on pressure support. Starting precedex/klonopin. Obtaining hemolytic labs. 7/6 trached 7/9: Copious secretions still noted.  Tachypneic.  Mildly hypotensive.  Presuming anxiety complicating weaning efforts titration of anxiolytic regimen performed, starting aerosol trach collar trials 7/10: Was on trach collar, pulse oximetry dropping to 40s supplemental oxygen increase but still on the needed achieve 70% FiO2 placed back on ventilator lavaged for pink-tan secretions saturations improved back on vent, checking sputum culture and chest x-ray. 7/11 looks better. Working on weaning  7/19 transfer back to ICU after severe desaturations and also hypercarbic failure.  7/21 no overnight events 7/22 on vent for desaturation to 60s with mucus plugging event 7/24 started prednisone for wheezing/increased WOB  Consults:  PCCM  Procedures:  ETT 6/29-7/6 Tracheostomy 7/6  Significant Diagnostic Tests:    Micro Data:  6/29 SARS COV 2 > neg 6/29 urine culture > insignificant growth 6/29 blood > negative 7/10 resp culture > e coli, klebsiella pneumoniae 7/20 resp culture > E coli  Antimicrobials:  6/29 vancomycin>6/30 6/29 pip/tazo> 7/3 Ancef 7/12 -7/18 CTX 7/22-7/23 Ancef 7/23>   Interim history/subjective:   Feels better today  Objective    Blood pressure (!) 91/59, pulse 74, temperature 98.7 F (37.1 C), temperature source Oral, resp. rate 21, height 5\' 9"  (1.753 m), weight 82.7 kg, SpO2 100 %.    Vent Mode: PRVC FiO2 (%):  [40 %-100 %] 40 % Set Rate:  [24 bmp-32 bmp] 24 bmp Vt Set:  [530 mL] 530 mL PEEP:  [5 cmH20] 5 cmH20 Plateau Pressure:  [20 cmH20-30 cmH20] 25 cmH20   Intake/Output Summary (Last 24 hours) at 10/01/2019 0744 Last data filed at 10/01/2019 0600 Gross per 24 hour  Intake 2880 ml  Output 2200 ml  Net 680 ml   Filed Weights   09/20/19 0500 09/25/19 0122 09/27/19 0600  Weight: 79.5 kg 82.1 kg 82.7 kg    Examination:  General:  In bed on vent HENT: NCAT trache in place PULM: Wheezing bilaterally, vent supported breathing CV: RRR, no mgr GI: BS+, soft, nontender MSK: normal bulk and tone Neuro: awake, alert, good spirits    Resolved Hospital Problem list     Assessment & Plan:  Acute on chronic respiratory failure with hypercarbia and hypoxemia COPD with acute exacerbation> improved after restarting prednisone on 4/09 Above complicated by anxiety Tracheostomy status Continue brovana/pulmicort/yupelri Continue prednisone daily x days, then re-assess need for more vs d/c systemic steroids Continue SABA as ordered Continue pressure support as tolerated during daytime Pulm toilette PRN ativan   Best practice:   Per [facility]  Labs   CBC: Recent Labs  Lab 09/26/19 0142 09/26/19 0505 09/27/19 0912 09/30/19 0453 10/01/19 0310  WBC  --  12.1* 8.0 7.0 8.1  NEUTROABS  --  9.5*  --   --   --   HGB 11.6* 9.2*  8.9* 8.1* 8.2*  HCT 34.0* 32.4* 32.1* 29.0* 27.8*  MCV  --  93.9 95.3 95.1 94.9  PLT  --  383 332 283 017    Basic Metabolic Panel: Recent Labs  Lab 09/26/19 0505 09/27/19 0912 09/30/19 0453 10/01/19 0310 10/01/19 0603  NA 139 140 137 136 136  K 4.2 4.4 4.3 3.6 4.0  CL 100 100 99 99 101  CO2 28 31 28 28 28   GLUCOSE 133* 149* 128* 139* 132*  BUN 32* 31* 34* 30* 32*   CREATININE 0.85 0.56 0.72 0.69 0.62  CALCIUM 9.6 10.0 9.6 9.6 9.5   GFR: Estimated Creatinine Clearance: 82.7 mL/min (by C-G formula based on SCr of 0.62 mg/dL). Recent Labs  Lab 09/26/19 0505 09/27/19 0912 09/30/19 0453 10/01/19 0310  WBC 12.1* 8.0 7.0 8.1    Liver Function Tests: Recent Labs  Lab 09/26/19 0505 09/30/19 0453  AST 18 17  ALT 8 11  ALKPHOS 77 67  BILITOT 0.4 0.2*  PROT 6.6 5.9*  ALBUMIN 2.5* 2.5*   No results for input(s): LIPASE, AMYLASE in the last 168 hours. No results for input(s): AMMONIA in the last 168 hours.  ABG    Component Value Date/Time   PHART 7.363 09/26/2019 0142   PCO2ART 52.4 (H) 09/26/2019 0142   PO2ART 68 (L) 09/26/2019 0142   HCO3 29.7 (H) 09/26/2019 0142   TCO2 31 09/26/2019 0142   ACIDBASEDEF 1.0 08/15/2019 1944   O2SAT 92.0 09/26/2019 0142     Coagulation Profile: No results for input(s): INR, PROTIME in the last 168 hours.  Cardiac Enzymes: No results for input(s): CKTOTAL, CKMB, CKMBINDEX, TROPONINI in the last 168 hours.  HbA1C: Hgb A1c MFr Bld  Date/Time Value Ref Range Status  08/11/2019 08:00 AM 5.8 (H) 4.8 - 5.6 % Final    Comment:    (NOTE) Pre diabetes:          5.7%-6.4% Diabetes:              >6.4% Glycemic control for   <7.0% adults with diabetes   01/10/2019 04:12 AM 5.7 (H) 4.8 - 5.6 % Final    Comment:    (NOTE) Pre diabetes:          5.7%-6.4% Diabetes:              >6.4% Glycemic control for   <7.0% adults with diabetes     CBG: Recent Labs  Lab 09/30/19 1158 09/30/19 1642 09/30/19 1937 09/30/19 2316 10/01/19 0349  GLUCAP 120* 137* 134* 112* 117*       Critical care time: n/a     Roselie Awkward, MD [place] [facility] Pager: [phone] Cell: [phone] If no response, call [phone]

## 2019-10-01 NOTE — TOC Progression Note (Signed)
Transition of Care [facility]) - Progression Note    Patient Details  Name: Chelsea Allison MRN: [id number] Date of Birth: March 09, 1956  Transition of Care [facility]) CM/SW Contact  Elliot Gurney [place], [place] Phone Number: [phone] 10/01/2019, 9:58 AM  Clinical Narrative:    Phone call to Clinical Liaison Della Goo [phone] for [employer] to check status of patient's appeal. Per clinical liaison, patient's appeal is still pending. No determination has been made at this time.  Update on status of appeal to be provided on Monday. Contact information for [facility] care managers assigned to patient's unit provided for follow up.   [place], LCSW Transitions of Care [phone]    Expected Discharge Plan: [place] (LTAC)    Expected Discharge Plan and Services Expected Discharge Plan: Long Term Acute Care (LTAC) In-house Referral: Clinical Social Work Discharge Planning Services: CM Consult ([facility]) Post Acute Care Choice: Long Term Acute Care (LTAC) Living arrangements for the past 2 months: Single Family Home                                       Social Determinants of Health (SDOH) Interventions    Readmission Risk Interventions Readmission Risk Prevention Plan 01/11/2019 12/20/2018 12/20/2018  Transportation Screening Complete - Complete  PCP or Specialist Appt within 5-7 Days - Complete -  PCP or Specialist Appt within 3-5 Days (No Data) - -  Home Care Screening - - Complete  Medication Review (RN CM) - - Complete  HRI or Home Care Consult Complete - -  Social Work Consult for [place] Planning/Counseling Complete - -  Palliative Care Screening Not Applicable - -  Medication Review (RN Care Manager) Complete - -  Some recent data might be hidden

## 2019-10-02 DIAGNOSIS — J9611 Chronic respiratory failure with hypoxia: Secondary | ICD-10-CM

## 2019-10-02 LAB — BASIC METABOLIC PANEL
Anion gap: 10 (ref 5–15)
BUN: 27 mg/dL — ABNORMAL HIGH (ref 8–23)
CO2: 26 mmol/L (ref 22–32)
Calcium: 9.5 mg/dL (ref 8.9–10.3)
Chloride: 95 mmol/L — ABNORMAL LOW (ref 98–111)
Creatinine, Ser: 0.79 mg/dL (ref 0.44–1.00)
GFR calc Af Amer: >60 mL/min (ref >60)
GFR calc non Af Amer: >60 mL/min (ref >60)
Glucose, Bld: 276 mg/dL — ABNORMAL HIGH (ref 70–99)
Potassium: 4.6 mmol/L (ref 3.5–5.1)
Sodium: 131 mmol/L — ABNORMAL LOW (ref 135–145)

## 2019-10-02 LAB — GLUCOSE, CAPILLARY
Glucose-Capillary: 103 mg/dL — ABNORMAL HIGH (ref 70–99)
Glucose-Capillary: 115 mg/dL — ABNORMAL HIGH (ref 70–99)
Glucose-Capillary: 120 mg/dL — ABNORMAL HIGH (ref 70–99)
Glucose-Capillary: 143 mg/dL — ABNORMAL HIGH (ref 70–99)
Glucose-Capillary: 225 mg/dL — ABNORMAL HIGH (ref 70–99)
Glucose-Capillary: 97 mg/dL (ref 70–99)

## 2019-10-02 LAB — CBC
HCT: 36.4 % (ref 36.0–46.0)
Hemoglobin: 10.2 g/dL — ABNORMAL LOW (ref 12.0–15.0)
MCH: 26.9 pg (ref 26.0–34.0)
MCHC: 28 g/dL — ABNORMAL LOW (ref 30.0–36.0)
MCV: 96 fL (ref 80.0–100.0)
Platelets: 459 10*3/uL — ABNORMAL HIGH (ref 150–400)
RBC: 3.79 MIL/uL — ABNORMAL LOW (ref 3.87–5.11)
RDW: 20 % — ABNORMAL HIGH (ref 11.5–15.5)
WBC: 17.4 10*3/uL — ABNORMAL HIGH (ref 4.0–10.5)
nRBC: 0 % (ref 0.0–0.2)

## 2019-10-02 LAB — MAGNESIUM: Magnesium: 2.4 mg/dL (ref 1.7–2.4)

## 2019-10-02 LAB — PHOSPHORUS: Phosphorus: 5.4 mg/dL — ABNORMAL HIGH (ref 2.5–4.6)

## 2019-10-02 MED ORDER — QUETIAPINE FUMARATE 25 MG PO TABS
12.5000 mg | ORAL_TABLET | Freq: Once | ORAL | Status: AC
  Administered 2019-10-02: 12.5 mg via ORAL
  Filled 2019-10-02: qty 1

## 2019-10-02 MED ORDER — FREE WATER: 200.0000 mL | Freq: Three times a day (TID) | Status: DC

## 2019-10-02 MED ORDER — BUDESONIDE 0.25 MG/2ML IN SUSP
0.2500 mg | Freq: Two times a day (BID) | RESPIRATORY_TRACT | Status: DC
  Administered 2019-10-02 – 2019-10-04 (×4): 0.25 mg via RESPIRATORY_TRACT
  Filled 2019-10-02 (×4): qty 2

## 2019-10-02 MED ORDER — FREE WATER
100.0000 mL | Freq: Three times a day (TID) | Status: DC
  Administered 2019-10-02 – 2019-10-04 (×5): 100 mL

## 2019-10-02 MED ORDER — QUETIAPINE FUMARATE 25 MG PO TABS
25.0000 mg | ORAL_TABLET | Freq: Two times a day (BID) | ORAL | Status: DC
  Administered 2019-10-02 – 2019-10-04 (×4): 25 mg
  Filled 2019-10-02 (×4): qty 1

## 2019-10-02 MED ORDER — FUROSEMIDE 10 MG/ML IJ SOLN
40.0000 mg | Freq: Once | INTRAMUSCULAR | Status: AC
  Administered 2019-10-02: 40 mg via INTRAVENOUS
  Filled 2019-10-02: qty 4

## 2019-10-02 NOTE — Progress Notes (Signed)
[facility]  PROGRESS NOTE  Chelsea Allison TGY:[id number] DOB: [birth year] DOA: 09/05/2019 PCP: Beckie Salts, MD   Brief HPI:   64 year old female with past medical history of CAD s/p PCI in October 2020, COPD, dyslipidemia, hypertension, non-small cell lung cancer stage III status post partial lung resection, anal cancer status post colectomy was admitted to the hospital with acute on chronic hypoxic/hypercapnic respiratory failure from pneumonia and COPD.  Patient is status post tracheostomy.  She is currently requiring mechanical ventilation.  PCCM is following.    Subjective   Patient seen and examined, getting chest PT. Patient is on vent support for chronic hypoxemic respiratory failure.   Assessment/Plan:     1. Acute on chronic hypoxemic/hypercapnic respiratory failure-secondary to COPD exacerbation, right upper lobe pneumonia.  Patient underwent tracheostomy on 09/12/2019 for inability to wean off ventilator and repeated intubations..  Patient received antibiotic course for E. coli/Klebsiella pneumonia.  Currently patient is requiring mechanical ventilation.  PCCM following 2. COPD exacerbation-patient was transferred out of ICU and to ICU on 09/25/2019 after mucous plugging event.  Continue bronchodilators, chest PT, wean ventilator as tolerated.  Started on prednisone 20 mg daily for 5 doses by PCCM. 3. Chronic cancer-related pain/anxiety-continue oxycodone 10 mg every 8 hours, buprenorphine patch, Seroquel 12.5 mg p.o. twice daily. 4. Chronic HFrEF-patient has history of CAD, EF 35 to 40% on echo from 08/12/2019.  Currently not requiring diuretics.  Continue aspirin, Brilinta. 5. Anxiety/agitation-patient became anxious this morning, she is currently on vent.  Continue  Ativan 1 mg IV every 4 hours as needed for agitation/anxiety. 6. Diabetes mellitus type 2-blood sugar stable, continue sliding scale insulin with NovoLog.  CBG well controlled. 7. History of non-small cell lung  cancer-status post partial lung resection.  Follow-up oncology as outpatient. 8. History of rectal cancer s/p colectomy-stable 9. Iron deficiency anemia-hemoglobin is stable, transfuse for hemoglobin less than 7.  No clear source of bleeding.    Scheduled medications:   . arformoterol  15 mcg Nebulization BID  . vitamin C  250 mg Per Tube BID  . aspirin  81 mg Per Tube Daily  . budesonide (PULMICORT) nebulizer solution  0.25 mg Nebulization BID  . buprenorphine  2 patch Transdermal Weekly  . chlorhexidine gluconate (MEDLINE KIT)  15 mL Mouth Rinse BID  . Chlorhexidine Gluconate Cloth  6 each Topical Daily  . clonazePAM  0.5 mg Per Tube BID  . collagenase   Topical Daily  . docusate  100 mg Per Tube BID  . enoxaparin (LOVENOX) injection  40 mg Subcutaneous Q24H  . feeding supplement (PROSource TF)  45 mL Per Tube TID  . free water  200 mL Per Tube Q4H  . guaiFENesin  15 mL Per Tube Q6H  . insulin aspart  0-15 Units Subcutaneous Q4H  . mouth rinse  15 mL Mouth Rinse 10 times per day  . pantoprazole sodium  40 mg Per Tube Daily  . polyethylene glycol  17 g Per Tube Daily  . QUEtiapine  12.5 mg Oral Once  . QUEtiapine  25 mg Per Tube BID  . revefenacin  175 mcg Nebulization Daily  . rosuvastatin  20 mg Per Tube Daily  . ticagrelor  90 mg Per Tube BID  . vitamin B-12  1,000 mcg Per Tube QODAY  . Vitamin D (Ergocalciferol)  50,000 Units Per Tube Q7 days  . zinc sulfate  220 mg Per Tube Daily     Antibiotics:   Anti-infectives (From admission, onward)  Start     Dose/Rate Route Frequency Ordered Stop   09/29/19 1800  ceFAZolin (ANCEF) IVPB 2g/100 mL premix     Discontinue     2 g 200 mL/hr over 30 Minutes Intravenous Every 8 hours 09/29/19 0915 10/05/19 0159   09/28/19 1630  cefTRIAXone (ROCEPHIN) 1 g in sodium chloride 0.9 % 100 mL IVPB  Status:  Discontinued        1 g 200 mL/hr over 30 Minutes Intravenous Every 24 hours 09/28/19 1630 09/29/19 0915   09/18/19 1000   ceFAZolin (ANCEF) IVPB 2g/100 mL premix        2 g 200 mL/hr over 30 Minutes Intravenous Every 8 hours 09/18/19 0933 09/25/19 0406   09/06/19 0830  fluconazole (DIFLUCAN) tablet 150 mg        150 mg Per Tube  Once 09/06/19 0743 09/06/19 0834   09/05/19 2000  vancomycin (VANCOCIN) IVPB 1000 mg/200 mL premix  Status:  Discontinued        1,000 mg 200 mL/hr over 60 Minutes Intravenous Every 12 hours 09/05/19 1048 09/06/19 0743   09/05/19 1400  piperacillin-tazobactam (ZOSYN) IVPB 3.375 g  Status:  Discontinued        3.375 g 12.5 mL/hr over 240 Minutes Intravenous Every 8 hours 09/05/19 1048 09/09/19 1159   09/05/19 0830  vancomycin (VANCOCIN) IVPB 1000 mg/200 mL premix        1,000 mg 200 mL/hr over 60 Minutes Intravenous  Once 09/05/19 0827 09/05/19 1033   09/05/19 0830  piperacillin-tazobactam (ZOSYN) IVPB 3.375 g        3.375 g 12.5 mL/hr over 240 Minutes Intravenous  Once 09/05/19 0827 09/05/19 1117      CBG: Recent Labs  Lab 10/01/19 1701 10/01/19 1940 10/01/19 2359 10/02/19 0339 10/02/19 0735  GLUCAP 125* 101* 97 115* 103*    SpO2: 100 % O2 Flow Rate (L/min): 8 L/min FiO2 (%): 40 %    CBC: Recent Labs  Lab 09/26/19 0505 09/27/19 0912 09/30/19 0453 10/01/19 0310 10/02/19 1043  WBC 12.1* 8.0 7.0 8.1 17.4*  NEUTROABS 9.5*  --   --   --   --   HGB 9.2* 8.9* 8.1* 8.2* 10.2*  HCT 32.4* 32.1* 29.0* 27.8* 36.4  MCV 93.9 95.3 95.1 94.9 96.0  PLT 383 332 283 288 459*    Basic Metabolic Panel: Recent Labs  Lab 09/27/19 0912 09/30/19 0453 10/01/19 0310 10/01/19 0603 10/02/19 1043  NA 140 137 136 136 131*  K 4.4 4.3 3.6 4.0 4.6  CL 100 99 99 101 95*  CO2 '31 28 28 28 26  ' GLUCOSE 149* 128* 139* 132* 276*  BUN 31* 34* 30* 32* 27*  CREATININE 0.56 0.72 0.69 0.62 0.79  CALCIUM 10.0 9.6 9.6 9.5 9.5     Liver Function Tests: Recent Labs  Lab 09/26/19 0505 09/30/19 0453  AST 18 17  ALT 8 11  ALKPHOS 77 67  BILITOT 0.4 0.2*  PROT 6.6 5.9*  ALBUMIN 2.5*  2.5*      DVT prophylaxis: Lovenox  Code Status: Full code  Family Communication: No family at bedside    Status is: Inpatient  Dispo: The patient is from: Home              Anticipated d/c is to: LTAC              Anticipated d/c date is: 10/04/2019              Patient currently not medically  stable for discharge, requiring mechanical ventilation  Barrier to discharge-currently requiring mechanical ventilation, not ready for discharge.  Pressure Injury 09/07/19 Sacrum Unstageable - Full thickness tissue loss in which the base of the injury is covered by slough (yellow, tan, gray, green or brown) and/or eschar (tan, brown or black) in the wound bed. (Active)  09/07/19 1000  Location: Sacrum  Location Orientation:   Staging: Unstageable - Full thickness tissue loss in which the base of the injury is covered by slough (yellow, tan, gray, green or brown) and/or eschar (tan, brown or black) in the wound bed.  Wound Description (Comments):   Present on Admission: Yes     Pressure Injury 09/10/19 Sacrum Lower Stage 3 -  Full thickness tissue loss. Subcutaneous fat may be visible but bone, tendon or muscle are NOT exposed. 1x1x1 (Active)  09/10/19 1355  Location: Sacrum  Location Orientation: Lower  Staging: Stage 3 -  Full thickness tissue loss. Subcutaneous fat may be visible but bone, tendon or muscle are NOT exposed.  Wound Description (Comments): 1x1x1  Present on Admission:      Pressure Injury 09/10/19 Lip Left skin breakdown from ETT (Active)  09/10/19 0700  Location: Lip  Location Orientation: Left  Staging:   Wound Description (Comments): skin breakdown from ETT  Present on Admission:     Consultants:  PCCM  Procedures:  S/p tracheostomy   Objective   Vitals:   10/02/19 0700 10/02/19 0746 10/02/19 0800 10/02/19 0900  BP: (!) 88/59  (!) 87/66 (!) 95/51  Pulse: 72  72 83  Resp: (!) 24  (!) 24 19  Temp:  98 F (36.7 C)    TempSrc:  Oral    SpO2: 100%   100% 100%  Weight:      Height:        Intake/Output Summary (Last 24 hours) at 10/02/2019 1138 Last data filed at 10/02/2019 1100 Gross per 24 hour  Intake 2885 ml  Output 2400 ml  Net 485 ml    07/24 1901 - 07/26 0700 In: 4720.1  Out: 3900 [Urine:3600]  Filed Weights   09/20/19 0500 09/25/19 0122 09/27/19 0600  Weight: 79.5 kg 82.1 kg 82.7 kg    Physical Examination:   General-appears in no acute distress Heart-S1-S2, regular, no murmur auscultated HEENT-tracheostomy in place, on mechanical ventilation Lungs-clear to auscultation bilaterally, no wheezing or crackles auscultated Abdomen-soft, nontender, no organomegaly Extremities-no edema in the lower extremities Neuro-alert, oriented x3, no focal deficit noted   Data Reviewed:   Recent Results (from the past 240 hour(s))  Culture, respiratory (non-expectorated)     Status: None   Collection Time: 09/26/19  9:46 AM   Specimen: Tracheal Aspirate; Respiratory  Result Value Ref Range Status   Specimen Description TRACHEAL ASPIRATE  Final   Special Requests Normal  Final   Gram Stain   Final    RARE WBC PRESENT, PREDOMINANTLY PMN FEW GRAM NEGATIVE RODS FEW GRAM POSITIVE COCCI IN PAIRS Performed at [facility], [place] [address] [place], [place] [address]    Culture FEW ESCHERICHIA COLI  Final   Report Status 09/30/2019 FINAL  Final   Organism ID, Bacteria ESCHERICHIA COLI  Final      Susceptibility   Escherichia coli - MIC*    AMPICILLIN <=2 SENSITIVE Sensitive     CEFAZOLIN <=4 SENSITIVE Sensitive     CEFEPIME <=0.12 SENSITIVE Sensitive     CEFTAZIDIME <=1 SENSITIVE Sensitive     CEFTRIAXONE <=0.25 SENSITIVE Sensitive  CIPROFLOXACIN 1 SENSITIVE Sensitive     GENTAMICIN <=1 SENSITIVE Sensitive     IMIPENEM <=0.25 SENSITIVE Sensitive     TRIMETH/SULFA <=20 SENSITIVE Sensitive     AMPICILLIN/SULBACTAM <=2 SENSITIVE Sensitive     PIP/TAZO <=4 SENSITIVE Sensitive     * FEW ESCHERICHIA COLI    No  results for input(s): LIPASE, AMYLASE in the last 168 hours. No results for input(s): AMMONIA in the last 168 hours.  Cardiac Enzymes: No results for input(s): CKTOTAL, CKMB, CKMBINDEX, TROPONINI in the last 168 hours. BNP (last 3 results) Recent Labs    08/10/19 2150 08/13/19 0225 09/05/19 0735  BNP 318.9* 929.4* 2,477.5*     Oswald Hillock   [facility] If 7PM-7AM, please contact night-coverage at [email or web address], Office  [phone]   10/02/2019, 11:38 AM  LOS: 27 days

## 2019-10-02 NOTE — Progress Notes (Addendum)
NAME:  Chelsea Allison, MRN:  [id number], DOB:  [birth year], LOS: 24 ADMISSION DATE:  09/05/2019, CONSULTATION DATE:  6/29 REFERRING MD:  Stark Jock, CHIEF COMPLAINT:  Dyspnea   Brief History   64 yo female smoker presented to ED with altered mental status from acute on chronic hypoxic/hypercapnic respiratory failure 2nd to HCAP in setting of COPD. Has had prolonged mechanical ventilation, required tracheostomy.  Past Medical History  NSCLC s/p partial resection Anal cancer s/p colostomy CAD with MI s/p PCI COPD on home O2 HFrEF Tobacco abuse   Significant Hospital Events   6/29 admitted to [facility]  7/1 hgb drop.  7/2 SBTx4h 7/4 only tolerated 72min on pressure support. Starting precedex/klonopin. Obtaining hemolytic labs. 7/6 trached 7/9: Copious secretions still noted.  Tachypneic.  Mildly hypotensive.  Presuming anxiety complicating weaning efforts titration of anxiolytic regimen performed, starting aerosol trach collar trials 7/10: Was on trach collar, pulse oximetry dropping to 40s supplemental oxygen increase but still on the needed achieve 70% FiO2 placed back on ventilator lavaged for pink-tan secretions saturations improved back on vent, checking sputum culture and chest x-ray. 7/11 looks better. Working on weaning  7/19 transfer back to ICU after severe desaturations and also hypercarbic failure.  7/21 no overnight events 7/22 on vent for desaturation to 60s with mucus plugging event 7/24 started prednisone for wheezing/increased WOB  Consults:  PCCM  Procedures:  ETT 6/29-7/6 Tracheostomy 7/6  Significant Diagnostic Tests:    Micro Data:  6/29 SARS COV 2 > neg 6/29 urine culture > insignificant growth 6/29 blood > negative 7/10 resp culture > e coli, klebsiella pneumoniae 7/20 resp culture > E coli, suspect colonization  Antimicrobials:  6/29 vancomycin>6/30 6/29 pip/tazo> 7/3 Ancef 7/12 -7/18 CTX 7/22-7/23 Ancef 7/23>   Interim history/subjective:   Objective    Blood pressure (!) 87/66, pulse 72, temperature 98 F (36.7 C), temperature source Oral, resp. rate (!) 24, height 5\' 9"  (1.753 m), weight 82.7 kg, SpO2 100 %.    Vent Mode: PRVC FiO2 (%):  [40 %] 40 % Set Rate:  [24 bmp] 24 bmp Vt Set:  [530 mL-560 mL] 560 mL PEEP:  [5 cmH20] 5 cmH20 Pressure Support:  [16 cmH20] 16 cmH20 Plateau Pressure:  [26 cmH20] 26 cmH20   Intake/Output Summary (Last 24 hours) at 10/02/2019 0831 Last data filed at 10/02/2019 0800 Gross per 24 hour  Intake 3300.06 ml  Output 2200 ml  Net 1100.06 ml   Filed Weights   09/20/19 0500 09/25/19 0122 09/27/19 0600  Weight: 79.5 kg 82.1 kg 82.7 kg    Examination:  General:  In bed on vent HENT: NCAT trach in place PULM: Wheezing bilaterally, vent supported breathing CV: RRR, no mgr GI: BS+, soft, nontender MSK: normal bulk and tone, +1 pitting edema B/L Neuro: awake, alert, good spirits  Resolved Hospital Problem list     Assessment & Plan:  Acute on chronic respiratory failure with hypercarbia and hypoxemia COPD with acute exacerbation Above complicated by anxiety Tracheostomy status Attempted pressure support on vent so could trial trach collar, unfortunately patient unable to tolerate, HR increase to 130s, likely unable to remove from vent, will consult TOC for LTACH placement Continue brovana/pulmicort/yupelri Discontinue steroids, increased RR with anxiety Continue SABA as ordered Continue pressure support as tolerated during daytime Pulm toilette Klonopin 0.5 mg BID Increase Seroquel 25mg  BID  Chronic HFrEF-patient has history of CAD EF 35 to 40% on echo from 08/12/2019 +1 pitting edema today on exam, will give lasix  Best practice:   Per TRH  Labs   CBC: Recent Labs  Lab 09/26/19 0142 09/26/19 0505 09/27/19 0912 09/30/19 0453 10/01/19 0310  WBC  --  12.1* 8.0 7.0 8.1  NEUTROABS  --  9.5*  --   --   --   HGB 11.6* 9.2* 8.9* 8.1* 8.2*  HCT 34.0* 32.4* 32.1* 29.0* 27.8*  MCV  --   93.9 95.3 95.1 94.9  PLT  --  383 332 283 884    Basic Metabolic Panel: Recent Labs  Lab 09/26/19 0505 09/27/19 0912 09/30/19 0453 10/01/19 0310 10/01/19 0603  NA 139 140 137 136 136  K 4.2 4.4 4.3 3.6 4.0  CL 100 100 99 99 101  CO2 28 31 28 28 28   GLUCOSE 133* 149* 128* 139* 132*  BUN 32* 31* 34* 30* 32*  CREATININE 0.85 0.56 0.72 0.69 0.62  CALCIUM 9.6 10.0 9.6 9.6 9.5   GFR: Estimated Creatinine Clearance: 82.7 mL/min (by C-G formula based on SCr of 0.62 mg/dL). Recent Labs  Lab 09/26/19 0505 09/27/19 0912 09/30/19 0453 10/01/19 0310  WBC 12.1* 8.0 7.0 8.1    Liver Function Tests: Recent Labs  Lab 09/26/19 0505 09/30/19 0453  AST 18 17  ALT 8 11  ALKPHOS 77 67  BILITOT 0.4 0.2*  PROT 6.6 5.9*  ALBUMIN 2.5* 2.5*   No results for input(s): LIPASE, AMYLASE in the last 168 hours. No results for input(s): AMMONIA in the last 168 hours.  ABG    Component Value Date/Time   PHART 7.363 09/26/2019 0142   PCO2ART 52.4 (H) 09/26/2019 0142   PO2ART 68 (L) 09/26/2019 0142   HCO3 29.7 (H) 09/26/2019 0142   TCO2 31 09/26/2019 0142   ACIDBASEDEF 1.0 08/15/2019 1944   O2SAT 92.0 09/26/2019 0142     Coagulation Profile: No results for input(s): INR, PROTIME in the last 168 hours.  Cardiac Enzymes: No results for input(s): CKTOTAL, CKMB, CKMBINDEX, TROPONINI in the last 168 hours.  HbA1C: Hgb A1c MFr Bld  Date/Time Value Ref Range Status  08/11/2019 08:00 AM 5.8 (H) 4.8 - 5.6 % Final    Comment:    (NOTE) Pre diabetes:          5.7%-6.4% Diabetes:              >6.4% Glycemic control for   <7.0% adults with diabetes   01/10/2019 04:12 AM 5.7 (H) 4.8 - 5.6 % Final    Comment:    (NOTE) Pre diabetes:          5.7%-6.4% Diabetes:              >6.4% Glycemic control for   <7.0% adults with diabetes     CBG: Recent Labs  Lab 10/01/19 1701 10/01/19 1940 10/01/19 2359 10/02/19 0339 10/02/19 0735  GLUCAP 125* 101* 97 115* 103*    Chelsea Damme, MD [facility], PGY-2   PCCM:  64 yo severe COPD s/p trach. Remains need for on and off vent support,.   BP (!) 95/51   Pulse 83   Temp 98 F (36.7 C) (Oral)   Resp 19   Ht 5\' 9"  (1.753 m)   Wt 82.7 kg   SpO2 100%   BMI 26.92 kg/m   Gen: elderly fm, trach in place, on vent  HENT: Trach cdi, no sig secretions  Heart: RRR, s1 s2  Lungs: BL vented breaths  Abd: non distended   Labs reviewed   Vent  settings reviewed   A:  Chronic hypoxemic respiratory failure requiring vent support  I suspect she needs LTACH placement for prolonged wean  She does not want to go to a facility however I do not see another option at this point.   P: Continued tct as tolerated  Up in chair  Mobilization  Scheduled nebs  ICS, LABA, LAMA Continue long acting bdzs for anxiety.   Disposition to [facility].   Garner Nash, DO [facility] 10/02/2019 11:18 AM

## 2019-10-02 NOTE — TOC Progression Note (Signed)
Transition of Care [facility]) - Progression Note    Patient Details  Name: Chelsea Allison MRN: [id number] Date of Birth: 06-24-1955  [facility] CM/SW Contact  Bartholomew Crews, RN Phone Number: [phone] 10/02/2019, 8:40 AM  Clinical Narrative:     LATE ENTRY: Notified on Friday, July 23rd by Montey Hora at [facility] that patient was denied LTAC and that all appeals processes have been exhausted at this time.   Expected Discharge Plan: Long Term Acute Care (LTAC)    Expected Discharge Plan and Services Expected Discharge Plan: Long Term Acute Care (LTAC) In-house Referral: Clinical Social Work Discharge Planning Services: CM Consult ([facility]) Post Acute Care Choice: Long Term Acute Care (LTAC) Living arrangements for the past 2 months: Single Family Home                                       Social Determinants of Health (SDOH) Interventions    Readmission Risk Interventions Readmission Risk Prevention Plan 01/11/2019 12/20/2018 12/20/2018  Transportation Screening Complete - Complete  PCP or Specialist Appt within 5-7 Days - Complete -  PCP or Specialist Appt within 3-5 Days (No Data) - -  Home Care Screening - - Complete  Medication Review (RN CM) - - Complete  HRI or Home Care Consult Complete - -  Social Work Consult for [place] Planning/Counseling Complete - -  Palliative Care Screening Not Applicable - -  Medication Review (RN Care Manager) Complete - -  Some recent data might be hidden

## 2019-10-02 NOTE — Consult Note (Signed)
[place] Nurse wound follow up Patient receiving care in [facility] 3M[facility]. Wound type: unstageable to coccyx Measurement: 2 cm x 3cm Wound bed: 100% yellow Drainage (amount, consistency, odor) none Periwound: intact Dressing procedure/placement/frequency: continue Santyl and saline moistened gauze. Monitor the wound area(s) for worsening of condition such as: Signs/symptoms of infection,  Increase in size,  Development of or worsening of odor, Development of pain, or increased pain at the affected locations.  Notify the medical team if any of these develop. Val Riles, RN, MSN, CWOCN, CNS-BC, pager [phone]

## 2019-10-02 NOTE — Progress Notes (Addendum)
PT Cancellation Note  Patient Details Name: Chelsea Allison MRN: [id number] DOB: [birth year]   Cancelled Treatment:    Reason Eval/Treat Not Completed: Medical issues which prohibited therapy. Unsuccessful attempt to wean from vent this AM. Currently back on vent support. Pt anxious with 125 resting HR. PT to re-attempt as time allows.   Lorriane Shire 10/02/2019, 12:31 PM   Lorrin Goodell, PT  Office # [phone] Pager [phone]

## 2019-10-02 NOTE — Progress Notes (Signed)
Pt becomes very anxious when not on full vent support. Her Sp02 is 90% or greater and she is getting adequate Vt.

## 2019-10-03 ENCOUNTER — Other Ambulatory Visit: Payer: Self-pay | Admitting: Internal Medicine

## 2019-10-03 DIAGNOSIS — Z978 Presence of other specified devices: Secondary | ICD-10-CM

## 2019-10-03 DIAGNOSIS — I10 Essential (primary) hypertension: Secondary | ICD-10-CM

## 2019-10-03 LAB — BASIC METABOLIC PANEL
Anion gap: 10 (ref 5–15)
BUN: 30 mg/dL — ABNORMAL HIGH (ref 8–23)
CO2: 25 mmol/L (ref 22–32)
Calcium: 9.2 mg/dL (ref 8.9–10.3)
Chloride: 102 mmol/L (ref 98–111)
Creatinine, Ser: 0.62 mg/dL (ref 0.44–1.00)
GFR calc Af Amer: >60 mL/min (ref >60)
GFR calc non Af Amer: >60 mL/min (ref >60)
Glucose, Bld: 126 mg/dL — ABNORMAL HIGH (ref 70–99)
Potassium: 4 mmol/L (ref 3.5–5.1)
Sodium: 137 mmol/L (ref 135–145)

## 2019-10-03 LAB — CBC
HCT: 28.3 % — ABNORMAL LOW (ref 36.0–46.0)
Hemoglobin: 8.1 g/dL — ABNORMAL LOW (ref 12.0–15.0)
MCH: 27.4 pg (ref 26.0–34.0)
MCHC: 28.6 g/dL — ABNORMAL LOW (ref 30.0–36.0)
MCV: 95.6 fL (ref 80.0–100.0)
Platelets: 271 10*3/uL (ref 150–400)
RBC: 2.96 MIL/uL — ABNORMAL LOW (ref 3.87–5.11)
RDW: 19.6 % — ABNORMAL HIGH (ref 11.5–15.5)
WBC: 9.9 10*3/uL (ref 4.0–10.5)
nRBC: 0 % (ref 0.0–0.2)

## 2019-10-03 LAB — GLUCOSE, CAPILLARY
Glucose-Capillary: 103 mg/dL — ABNORMAL HIGH (ref 70–99)
Glucose-Capillary: 112 mg/dL — ABNORMAL HIGH (ref 70–99)
Glucose-Capillary: 118 mg/dL — ABNORMAL HIGH (ref 70–99)
Glucose-Capillary: 137 mg/dL — ABNORMAL HIGH (ref 70–99)
Glucose-Capillary: 89 mg/dL (ref 70–99)
Glucose-Capillary: 97 mg/dL (ref 70–99)
Glucose-Capillary: 99 mg/dL (ref 70–99)

## 2019-10-03 MED ORDER — JUVEN PO PACK
1.0000 | PACK | Freq: Two times a day (BID) | ORAL | Status: DC
  Administered 2019-10-04 (×2): 1
  Filled 2019-10-03 (×2): qty 1

## 2019-10-03 MED ORDER — FAMOTIDINE 40 MG/5ML PO SUSR
20.0000 mg | Freq: Once | ORAL | Status: AC
  Administered 2019-10-03: 20 mg via ORAL
  Filled 2019-10-03: qty 2.5

## 2019-10-03 NOTE — Progress Notes (Signed)
Physical Therapy Treatment Patient Details Name: Chelsea Allison MRN: [id number] DOB: 14-Jul-1955 Today's Date: 10/03/2019    History of Present Illness 64 yo female admitted from home 6/29 with respiratory distress and hypoxia.  Intubated in ED. Trach 7/6. pt with respiratory distress and return to vent 7/15. Was in hospital from 6/03 to 6/18 for COPD exacerbation, acute on chronic systolic CHF with NSTEMI requiring intubation who D/Cd to rehab then home 6/22.  PMHx: anal CA s/p colostomy, NSCLC s/p partial resection, CAD, CHF    PT Comments    Pt not eager to get up and definitely did not want to sit in the chair, but was interested to get up in the RW and move to the extent of vent tubing.  Emphasis on forward and back with stepping, side stepping and there ex in standing.    Follow Up Recommendations  SNF;Supervision/Assistance - 24 hour     Equipment Recommendations  None recommended by PT    Recommendations for Other Services       Precautions / Restrictions Precautions Precautions: Fall Precaution Comments: trach, cortrak, ostomy, sacral wounds    Mobility  Bed Mobility         Supine to sit: Min guard Sit to supine: Min guard   General bed mobility comments: use of rail and HOB mildly raised, but no assist needed to come up or scoot to EOB  Transfers Overall transfer level: Needs assistance Equipment used: Rolling walker (2 wheeled) Transfers: Sit to/from Stand Sit to Stand: Min guard         General transfer comment: cues for hand placement no assist  Ambulation/Gait Ambulation/Gait assistance: Min guard Gait Distance (Feet): 4 Feet (feet x3 in different directions., marchin in place, w/shifti) Assistive device: Rolling walker (2 wheeled) Gait Pattern/deviations: Step-through pattern Gait velocity: slower   General Gait Details: generally steady, limited by vent tubbib   Stairs             Wheelchair Mobility    Modified Rankin (Stroke  Patients Only)       Balance   Sitting-balance support: Feet supported Sitting balance-Leahy Scale: Good Sitting balance - Comments: EOB without UE support     Standing balance-Leahy Scale: Poor Standing balance comment: reliant on AD                            Cognition Arousal/Alertness: Awake/alert Behavior During Therapy: WFL for tasks assessed/performed                                          Exercises      General Comments General comments (skin integrity, edema, etc.): vss on vent CPAP setting      Pertinent Vitals/Pain Pain Assessment: Faces Faces Pain Scale: Hurts a little bit Pain Location: throat Pain Descriptors / Indicators: Burning Pain Intervention(s): Limited activity within patient's tolerance    Home Living Family/patient expects to be discharged to:: Private residence Living Arrangements: Alone Available Help at Discharge: Family;Available PRN/intermittently                Prior Function            PT Goals (current goals can now be found in the care plan section) Acute Rehab PT Goals PT Goal Formulation: With patient Time For Goal Achievement: 10/10/19 Potential to Achieve Goals: Good Progress  towards PT goals: Progressing toward goals    Frequency    Min 3X/week      PT Plan Current plan remains appropriate    Co-evaluation              AM-PAC PT "6 Clicks" Mobility   Outcome Measure  Help needed turning from your back to your side while in a flat bed without using bedrails?: A Little Help needed moving from lying on your back to sitting on the side of a flat bed without using bedrails?: A Little Help needed moving to and from a bed to a chair (including a wheelchair)?: A Little Help needed standing up from a chair using your arms (e.g., wheelchair or bedside chair)?: A Little Help needed to walk in hospital room?: A Little Help needed climbing 3-5 steps with a railing? : A Lot 6  Click Score: 17    End of Session         PT Visit Diagnosis: Other abnormalities of gait and mobility (R26.89);Difficulty in walking, not elsewhere classified (R26.2)     Time: 1340-1416 PT Time Calculation (min) (ACUTE ONLY): 36 min  Charges:  $Gait Training: 8-22 mins $Therapeutic Activity: 8-22 mins                     10/03/2019  [name], PT Acute Rehabilitation Services [phone]  (pager) [phone]  (office)   Tessie Fass Kabeer Hoagland 10/03/2019, 4:05 PM

## 2019-10-03 NOTE — Progress Notes (Addendum)
NAME:  Chelsea Allison, MRN:  [id number], DOB:  Jul 26, 1955, LOS: 108 ADMISSION DATE:  09/05/2019, CONSULTATION DATE:  6/29 REFERRING MD:  Stark Jock, CHIEF COMPLAINT:  Dyspnea   Brief History   64 yo female smoker presented to ED with altered mental status from acute on chronic hypoxic/hypercapnic respiratory failure 2nd to HCAP in setting of COPD. Has had prolonged mechanical ventilation, required tracheostomy.  Past Medical History  NSCLC s/p partial resection Anal cancer s/p colostomy CAD with MI s/p PCI COPD on home O2 HFrEF Tobacco abuse   Significant Hospital Events   6/29 admitted to [facility]  7/1 hgb drop.  7/2 SBTx4h 7/4 only tolerated 66min on pressure support. Starting precedex/klonopin. Obtaining hemolytic labs. 7/6 trached 7/9: Copious secretions still noted.  Tachypneic.  Mildly hypotensive.  Presuming anxiety complicating weaning efforts titration of anxiolytic regimen performed, starting aerosol trach collar trials 7/10: Was on trach collar, pulse oximetry dropping to 40s supplemental oxygen increase but still on the needed achieve 70% FiO2 placed back on ventilator lavaged for pink-tan secretions saturations improved back on vent, checking sputum culture and chest x-ray. 7/11 looks better. Working on weaning  7/19 transfer back to ICU after severe desaturations and also hypercarbic failure.  7/21 no overnight events 7/22 on vent for desaturation to 60s with mucus plugging event 7/24 started prednisone for wheezing/increased WOB 7/27 PS trials, failed TCT yesterday   Consults:  PCCM  Procedures:  ETT 6/29-7/6 Tracheostomy 7/6  Significant Diagnostic Tests:    Micro Data:  6/29 SARS COV 2 > neg 6/29 urine culture > insignificant growth 6/29 blood > negative 7/10 resp culture > e coli, klebsiella pneumoniae 7/20 resp culture > E coli, suspect colonization  Antimicrobials:  6/29 vancomycin>6/30 6/29 pip/tazo> 7/3 Ancef 7/12 -7/18 CTX 7/22-7/23 Ancef 7/23>    Interim history/subjective:   Patient able to write to communicate. She states that she feels ok. Appears more comfortable on vent this morning.   Objective   Blood pressure 113/71, pulse 94, temperature 97.7 F (36.5 C), temperature source Oral, resp. rate 22, height 5\' 9"  (1.753 m), weight 82.7 kg, SpO2 100 %.    Vent Mode: CPAP;PSV FiO2 (%):  [40 %-50 %] 50 % Set Rate:  [24 bmp] 24 bmp Vt Set:  [530 mL-560 mL] 560 mL PEEP:  [5 cmH20] 5 cmH20 Pressure Support:  [15 cmH20] 15 cmH20 Plateau Pressure:  [18 cmH20-24 cmH20] 24 cmH20   Intake/Output Summary (Last 24 hours) at 10/03/2019 0908 Last data filed at 10/03/2019 0600 Gross per 24 hour  Intake 2579.94 ml  Output 2250 ml  Net 329.94 ml   Filed Weights   09/20/19 0500 09/25/19 0122 09/27/19 0600  Weight: 79.5 kg 82.1 kg 82.7 kg    Examination:  General:  Elderly fm, resting in bed on vent  HENT: NCAT, sclera clear  PULM: No wheezing , bl vented breaths  CV: RRR, no MRG  GI: BS+, soft , ND  MSK: normal bulk and tone  Neuro: aaox3, moves all 4   Resolved Hospital Problem list     Assessment & Plan:   Chronic respiratory failure with hypercarbia and hypoxemia, ventilator dependent  COPD with acute exacerbation, now resolved  Anxiety  S/p Tracheostomy status - continue PS trials - attempt TCT as tolerated - I suspect a prolonged ventilator wean will be necessary  - continue long acting scheduled nebs - long acting bdz for anxiety control  - doing well on increased seroquel dosing  - pulmonary will follow for  vent management  - she needs LTACH placement   Vent settings reviewed:  Vent Mode: CPAP;PSV FiO2 (%):  [40 %-50 %] 50 % Set Rate:  [24 bmp] 24 bmp Vt Set:  [530 mL-560 mL] 560 mL PEEP:  [5 cmH20] 5 cmH20 Pressure Support:  [15 cmH20] 15 cmH20 Plateau Pressure:  [18 cmH20-24 cmH20] 24 cmH20  Chronic HFrEF-patient has history of CAD EF 35 to 40% on echo from 08/12/2019 - diuresis prn to maintain  euvolemia   Best practice:   Per TRH  Labs   CBC: Recent Labs  Lab 09/27/19 0912 09/30/19 0453 10/01/19 0310 10/02/19 1043 10/03/19 0321  WBC 8.0 7.0 8.1 17.4* 9.9  HGB 8.9* 8.1* 8.2* 10.2* 8.1*  HCT 32.1* 29.0* 27.8* 36.4 28.3*  MCV 95.3 95.1 94.9 96.0 95.6  PLT 332 283 288 459* 540    Basic Metabolic Panel: Recent Labs  Lab 09/30/19 0453 10/01/19 0310 10/01/19 0603 10/02/19 1043 10/02/19 1332 10/03/19 0321  NA 137 136 136 131*  --  137  K 4.3 3.6 4.0 4.6  --  4.0  CL 99 99 101 95*  --  102  CO2 28 28 28 26   --  25  GLUCOSE 128* 139* 132* 276*  --  126*  BUN 34* 30* 32* 27*  --  30*  CREATININE 0.72 0.69 0.62 0.79  --  0.62  CALCIUM 9.6 9.6 9.5 9.5  --  9.2  MG  --   --   --   --  2.4  --   PHOS  --   --   --   --  5.4*  --    GFR: Estimated Creatinine Clearance: 82.7 mL/min (by C-G formula based on SCr of 0.62 mg/dL). Recent Labs  Lab 09/30/19 0453 10/01/19 0310 10/02/19 1043 10/03/19 0321  WBC 7.0 8.1 17.4* 9.9    Liver Function Tests: Recent Labs  Lab 09/30/19 0453  AST 17  ALT 11  ALKPHOS 67  BILITOT 0.2*  PROT 5.9*  ALBUMIN 2.5*   No results for input(s): LIPASE, AMYLASE in the last 168 hours. No results for input(s): AMMONIA in the last 168 hours.  ABG    Component Value Date/Time   PHART 7.363 09/26/2019 0142   PCO2ART 52.4 (H) 09/26/2019 0142   PO2ART 68 (L) 09/26/2019 0142   HCO3 29.7 (H) 09/26/2019 0142   TCO2 31 09/26/2019 0142   ACIDBASEDEF 1.0 08/15/2019 1944   O2SAT 92.0 09/26/2019 0142     Coagulation Profile: No results for input(s): INR, PROTIME in the last 168 hours.  Cardiac Enzymes: No results for input(s): CKTOTAL, CKMB, CKMBINDEX, TROPONINI in the last 168 hours.  HbA1C: Hgb A1c MFr Bld  Date/Time Value Ref Range Status  08/11/2019 08:00 AM 5.8 (H) 4.8 - 5.6 % Final    Comment:    (NOTE) Pre diabetes:          5.7%-6.4% Diabetes:              >6.4% Glycemic control for   <7.0% adults with diabetes    01/10/2019 04:12 AM 5.7 (H) 4.8 - 5.6 % Final    Comment:    (NOTE) Pre diabetes:          5.7%-6.4% Diabetes:              >6.4% Glycemic control for   <7.0% adults with diabetes     CBG: Recent Labs  Lab 10/02/19 1556 10/02/19 1929 10/03/19 0001 10/03/19 0359 10/03/19 0726  GLUCAP 143* [place], DO  Pulmonary Critical Care 10/03/2019 9:08 AM

## 2019-10-03 NOTE — Progress Notes (Signed)
[facility]  PROGRESS NOTE  Chelsea Allison TDV:[id number] DOB: 1956/01/07 DOA: 09/05/2019 PCP: Beckie Salts, MD   Brief HPI:   64 year old female with past medical history of CAD s/p PCI in October 2020, COPD, dyslipidemia, hypertension, non-small cell lung cancer stage III status post partial lung resection, anal cancer status post colectomy was admitted to the hospital with acute on chronic hypoxic/hypercapnic respiratory failure from pneumonia and COPD.  Patient is status post tracheostomy.  She is currently requiring mechanical ventilation.  PCCM is following.    Subjective   Patient seen and examined, no new complaints.   Assessment/Plan:     1. Acute on chronic hypoxemic/hypercapnic respiratory failure-secondary to COPD exacerbation, right upper lobe pneumonia.  Patient underwent tracheostomy on 09/12/2019 for inability to wean off ventilator and repeated intubations..  Patient received antibiotic course for E. coli/Klebsiella pneumonia.  Currently patient is requiring mechanical ventilation.  PCCM following 2. COPD exacerbation-patient was transferred out of ICU and to ICU on 09/25/2019 after mucous plugging event.  Continue bronchodilators, chest PT, wean ventilator as tolerated.  Started on prednisone 20 mg daily for 5 doses by PCCM. 3. Chronic cancer-related pain/anxiety-continue oxycodone 10 mg every 8 hours, buprenorphine patch, Seroquel 12.5 mg p.o. twice daily. 4. Chronic HFrEF-patient has history of CAD, EF 35 to 40% on echo from 08/12/2019.  Currently not requiring diuretics.  Continue aspirin, Brilinta. 5. Anxiety/agitation-patient became anxious this morning, she is currently on vent.  Continue  Ativan 1 mg IV every 4 hours as needed for agitation/anxiety. 6. Diabetes mellitus type 2-blood sugar stable, continue sliding scale insulin with NovoLog.  CBG well controlled. 7. History of non-small cell lung cancer-status post partial lung resection.  Follow-up oncology as  outpatient. 8. History of rectal cancer s/p colectomy-stable 9. Iron deficiency anemia-hemoglobin is stable, transfuse for hemoglobin less than 7.  No clear source of bleeding.    Scheduled medications:   . arformoterol  15 mcg Nebulization BID  . vitamin C  250 mg Per Tube BID  . aspirin  81 mg Per Tube Daily  . budesonide (PULMICORT) nebulizer solution  0.25 mg Nebulization BID  . buprenorphine  2 patch Transdermal Weekly  . chlorhexidine gluconate (MEDLINE KIT)  15 mL Mouth Rinse BID  . Chlorhexidine Gluconate Cloth  6 each Topical Daily  . clonazePAM  0.5 mg Per Tube BID  . collagenase   Topical Daily  . docusate  100 mg Per Tube BID  . enoxaparin (LOVENOX) injection  40 mg Subcutaneous Q24H  . feeding supplement (PROSource TF)  45 mL Per Tube TID  . free water  100 mL Per Tube Q8H  . guaiFENesin  15 mL Per Tube Q6H  . insulin aspart  0-15 Units Subcutaneous Q4H  . mouth rinse  15 mL Mouth Rinse 10 times per day  . pantoprazole sodium  40 mg Per Tube Daily  . polyethylene glycol  17 g Per Tube Daily  . QUEtiapine  25 mg Per Tube BID  . revefenacin  175 mcg Nebulization Daily  . rosuvastatin  20 mg Per Tube Daily  . ticagrelor  90 mg Per Tube BID  . vitamin B-12  1,000 mcg Per Tube QODAY  . Vitamin D (Ergocalciferol)  50,000 Units Per Tube Q7 days  . zinc sulfate  220 mg Per Tube Daily     Antibiotics:   Anti-infectives (From admission, onward)   Start     Dose/Rate Route Frequency Ordered Stop   09/29/19 1800  ceFAZolin (ANCEF) IVPB  2g/100 mL premix     Discontinue     2 g 200 mL/hr over 30 Minutes Intravenous Every 8 hours 09/29/19 0915 10/05/19 0159   09/28/19 1630  cefTRIAXone (ROCEPHIN) 1 g in sodium chloride 0.9 % 100 mL IVPB  Status:  Discontinued        1 g 200 mL/hr over 30 Minutes Intravenous Every 24 hours 09/28/19 1630 09/29/19 0915   09/18/19 1000  ceFAZolin (ANCEF) IVPB 2g/100 mL premix        2 g 200 mL/hr over 30 Minutes Intravenous Every 8 hours  09/18/19 0933 09/25/19 0406   09/06/19 0830  fluconazole (DIFLUCAN) tablet 150 mg        150 mg Per Tube  Once 09/06/19 0743 09/06/19 0834   09/05/19 2000  vancomycin (VANCOCIN) IVPB 1000 mg/200 mL premix  Status:  Discontinued        1,000 mg 200 mL/hr over 60 Minutes Intravenous Every 12 hours 09/05/19 1048 09/06/19 0743   09/05/19 1400  piperacillin-tazobactam (ZOSYN) IVPB 3.375 g  Status:  Discontinued        3.375 g 12.5 mL/hr over 240 Minutes Intravenous Every 8 hours 09/05/19 1048 09/09/19 1159   09/05/19 0830  vancomycin (VANCOCIN) IVPB 1000 mg/200 mL premix        1,000 mg 200 mL/hr over 60 Minutes Intravenous  Once 09/05/19 0827 09/05/19 1033   09/05/19 0830  piperacillin-tazobactam (ZOSYN) IVPB 3.375 g        3.375 g 12.5 mL/hr over 240 Minutes Intravenous  Once 09/05/19 0827 09/05/19 1117      CBG: Recent Labs  Lab 10/02/19 1929 10/03/19 0001 10/03/19 0359 10/03/19 0726 10/03/19 1146  GLUCAP 120* 112* 118* 89 137*    SpO2: 99 % O2 Flow Rate (L/min): 8 L/min FiO2 (%): 50 %    CBC: Recent Labs  Lab 09/27/19 0912 09/30/19 0453 10/01/19 0310 10/02/19 1043 10/03/19 0321  WBC 8.0 7.0 8.1 17.4* 9.9  HGB 8.9* 8.1* 8.2* 10.2* 8.1*  HCT 32.1* 29.0* 27.8* 36.4 28.3*  MCV 95.3 95.1 94.9 96.0 95.6  PLT 332 283 288 459* 916    Basic Metabolic Panel: Recent Labs  Lab 09/30/19 0453 10/01/19 0310 10/01/19 0603 10/02/19 1043 10/02/19 1332 10/03/19 0321  NA 137 136 136 131*  --  137  K 4.3 3.6 4.0 4.6  --  4.0  CL 99 99 101 95*  --  102  CO2 _0 --  25  GLUCOSE 128* 139* 132* 276*  --  126*  BUN 34* 30* 32* 27*  --  30*  CREATININE 0.72 0.69 0.62 0.79  --  0.62  CALCIUM 9.6 9.6 9.5 9.5  --  9.2  MG  --   --   --   --  2.4  --   PHOS  --   --   --   --  5.4*  --      Liver Function Tests: Recent Labs  Lab 09/30/19 0453  AST 17  ALT 11  ALKPHOS 67  BILITOT 0.2*  PROT 5.9*  ALBUMIN 2.5*      DVT prophylaxis: Lovenox  Code Status:  Full code  Family Communication: No family at bedside    Status is: Inpatient  Dispo: The patient is from: Home              Anticipated d/c is to: LTAC              Anticipated  d/c date is: 10/08/2019              Patient currently awaiting bed at [facility] to discharge-awaiting bed at [facility] Injury 09/07/19 Sacrum Unstageable - Full thickness tissue loss in which the base of the injury is covered by slough (yellow, tan, gray, green or brown) and/or eschar (tan, brown or black) in the wound bed. (Active)  09/07/19 1000  Location: Sacrum  Location Orientation:   Staging: Unstageable - Full thickness tissue loss in which the base of the injury is covered by slough (yellow, tan, gray, green or brown) and/or eschar (tan, brown or black) in the wound bed.  Wound Description (Comments):   Present on Admission: Yes     Pressure Injury 09/10/19 Sacrum Lower Stage 3 -  Full thickness tissue loss. Subcutaneous fat may be visible but bone, tendon or muscle are NOT exposed. 1x1x1 (Active)  09/10/19 1355  Location: Sacrum  Location Orientation: Lower  Staging: Stage 3 -  Full thickness tissue loss. Subcutaneous fat may be visible but bone, tendon or muscle are NOT exposed.  Wound Description (Comments): 1x1x1  Present on Admission:      Pressure Injury 09/10/19 Lip Left skin breakdown from ETT (Active)  09/10/19 0700  Location: Lip  Location Orientation: Left  Staging:   Wound Description (Comments): skin breakdown from ETT  Present on Admission:     Consultants:  PCCM  Procedures:  S/p tracheostomy   Objective   Vitals:   10/03/19 1100 10/03/19 1145 10/03/19 1200 10/03/19 1300  BP: (!) 100/62   (!) 85/57  Pulse: 82  92 76  Resp: (!) 26  (!) 29 23  Temp:  98.5 F (36.9 C)    TempSrc:  Oral    SpO2: 100%  100% 99%  Weight:      Height:        Intake/Output Summary (Last 24 hours) at 10/03/2019 1504 Last data filed at 10/03/2019 1200 Gross per 24 hour   Intake 2300.07 ml  Output 2400 ml  Net -99.93 ml    07/25 1901 - 07/27 0700 In: 4354.9  Out: 3650 [Urine:3650]  Filed Weights   09/20/19 0500 09/25/19 0122 09/27/19 0600  Weight: 79.5 kg 82.1 kg 82.7 kg    Physical Examination:   General-appears in no acute distress Heart-S1-S2, regular, no murmur auscultated HEENT-tracheostomy in place, on mechanical ventilation Lungs-clear to auscultation bilaterally, no wheezing or crackles auscultated Abdomen-soft, nontender, no organomegaly Extremities-no edema in the lower extremities Neuro-alert, oriented x3, no focal deficit noted   Data Reviewed:   Recent Results (from the past 240 hour(s))  Culture, respiratory (non-expectorated)     Status: None   Collection Time: 09/26/19  9:46 AM   Specimen: Tracheal Aspirate; Respiratory  Result Value Ref Range Status   Specimen Description TRACHEAL ASPIRATE  Final   Special Requests Normal  Final   Gram Stain   Final    RARE WBC PRESENT, PREDOMINANTLY PMN FEW GRAM NEGATIVE RODS FEW GRAM POSITIVE COCCI IN PAIRS Performed at [facility], [place] [address], [place], [place] [address]    Culture FEW ESCHERICHIA COLI  Final   Report Status 09/30/2019 FINAL  Final   Organism ID, Bacteria ESCHERICHIA COLI  Final      Susceptibility   Escherichia coli - MIC*    AMPICILLIN <=2 SENSITIVE Sensitive     CEFAZOLIN <=4 SENSITIVE Sensitive     CEFEPIME <=0.12 SENSITIVE Sensitive     CEFTAZIDIME <=1  SENSITIVE Sensitive     CEFTRIAXONE <=0.25 SENSITIVE Sensitive     CIPROFLOXACIN 1 SENSITIVE Sensitive     GENTAMICIN <=1 SENSITIVE Sensitive     IMIPENEM <=0.25 SENSITIVE Sensitive     TRIMETH/SULFA <=20 SENSITIVE Sensitive     AMPICILLIN/SULBACTAM <=2 SENSITIVE Sensitive     PIP/TAZO <=4 SENSITIVE Sensitive     * FEW ESCHERICHIA COLI    No results for input(s): LIPASE, AMYLASE in the last 168 hours. No results for input(s): AMMONIA in the last 168 hours.  Cardiac Enzymes: No results  for input(s): CKTOTAL, CKMB, CKMBINDEX, TROPONINI in the last 168 hours. BNP (last 3 results) Recent Labs    08/10/19 2150 08/13/19 0225 09/05/19 0735  BNP 318.9* 929.4* 2,477.5*     Oswald Hillock   [facility] If 7PM-7AM, please contact night-coverage at [email or web address], Office  [phone]   10/03/2019, 3:04 PM  LOS: 28 days

## 2019-10-03 NOTE — Progress Notes (Signed)
Nutrition Follow-up  DOCUMENTATION CODES:   Not applicable  INTERVENTION:                   Tube FeedingviaCortrak: Vital 1.5 at 60 ml/hr Change to Pro-Source TF 45 mL TID Provides130g of protein, 2280 kcals and 1094 mL of free water Meets 100% estimated calorie and protein needs  Add Juven BID, each packet provides 80 calories, 8 grams of carbohydrate, 2.5  grams of protein (collagen), 7 grams of L-arginine and 7 grams of L-glutamine; supplement contains CaHMB, Vitamins C, E, B12 and Zinc to promote wound healing  LOS 28 days; if unable to transition off vent and advance diet, recommend considering PEG placement  NUTRITION DIAGNOSIS:   Inadequate oral intake related to acute illness as evidenced by NPO status.  Being addressed via TF   GOAL:   Patient will meet greater than or equal to 90% of their needs  Met   MONITOR:   Vent status, Diet advancement, Labs, Weight trends, Skin  REASON FOR ASSESSMENT:   Consult, Ventilator Enteral/tube feeding initiation and management  ASSESSMENT:   64 yo female admitted with acute on chronic respiratory failure from HCAP requiring intubation PMH includes NSCLS s/p partial resection, anal cancer s/p colostomy, acute on chronic respiratory failure requiring repeat intubations, CHF, COPD, CAD, MI  6/29 Admitted, Intubated 7/06 Lurline Idol, Cortrak placed 7/18 Transferred out of ICU, tolerating TC x 24 hours 7/19 Transferred back to ICU for vent support  Pt alert, able to communicate via writing. Working with PT Remains on vent support via trach, attempting PS trials as able, MD suspecting prolonged vent wean will be necessary  Vital 1.5 at 60 ml/hr, Pro-source TF 45 mL TID, free water flush of 100 mL q 8 hours and Juven BID via Cortrak tube  Weight has fluctuated since admission but overall stable. Current weight 82.7 kg; admit weight 82.5 kg  Pt with unstageable wound to coccyx, WOC RN following  Pt has been receiving  Vitamin C 250 mg BID and Zinc Sulfate 220 mg daily since 09/07/19. On day 27/30 day treatment.   Labs: CBGs 89-137 Meds: ascorbic acid, ss novolog, B-12, vit D, zinc sulfate   Diet Order:   Diet Order            Diet NPO time specified  Diet effective midnight                 EDUCATION NEEDS:   Not appropriate for education at this time  Skin:  Skin Assessment: Skin Integrity Issues: Skin Integrity Issues:: Unstageable Stage III: sacrum Unstageable: coccyx  Last BM:  7/27 colostomy  Height:   Ht Readings from Last 1 Encounters:  09/05/19 _0  (1.753 m)    Weight:   Wt Readings from Last 1 Encounters:  09/27/19 82.7 kg    BMI:  Body mass index is 26.92 kg/m.  Estimated Nutritional Needs:   Kcal:  2000-2300 kcals  Protein:  120-140 g  Fluid:  >/= 2 L   Kerman Passey MS, RDN, LDN, CNSC Registered Dietitian III Clinical Nutrition RD Pager and On-Call Pager Number Located in [place]

## 2019-10-03 NOTE — TOC Progression Note (Signed)
Transition of Care [facility]) - Progression Note    Patient Details  Name: Chelsea Allison MRN: [id number] Date of Birth: 01-12-1956  Transition of [facility]) CM/SW Contact  Bartholomew Crews, RN Phone Number: [phone] 10/03/2019, 11:17 AM  Clinical Narrative:     NCM spoke with Corinna at [place]. New case submitted for authorization d/t change in status. TOC following for transition needs.   Expected Discharge Plan: Long Term Acute Care (LTAC)    Expected Discharge Plan and Services Expected Discharge Plan: Long Term Acute Care (LTAC) In-house Referral: Clinical Social Work Discharge Planning Services: CM Consult ([facility]) Post Acute Care Choice: Long Term Acute Care (LTAC) Living arrangements for the past 2 months: Single Family Home                                       Social Determinants of Health (SDOH) Interventions    Readmission Risk Interventions Readmission Risk Prevention Plan 01/11/2019 12/20/2018 12/20/2018  Transportation Screening Complete - Complete  PCP or Specialist Appt within 5-7 Days - Complete -  PCP or Specialist Appt within 3-5 Days (No Data) - -  Home Care Screening - - Complete  Medication Review (RN CM) - - Complete  HRI or Home Care Consult Complete - -  Social Work Consult for [place] Planning/Counseling Complete - -  Palliative Care Screening Not Applicable - -  Medication Review (RN Care Manager) Complete - -  Some recent data might be hidden

## 2019-10-03 NOTE — Progress Notes (Signed)
SLP Cancellation Note  Patient Details Name: Chelsea Allison MRN: [id number] DOB: 07-Aug-1955   Cancelled treatment:        Pt on vent. Will continue efforts.    Houston Siren 10/03/2019, 4:50 PM  Orbie Pyo Colvin Caroli.Ed Risk analyst [phone] Office [phone]

## 2019-10-04 ENCOUNTER — Inpatient Hospital Stay
Admission: RE | Admit: 2019-10-04 | Discharge: 2019-10-26 | Payer: Medicare HMO | Source: Ambulatory Visit | Attending: Internal Medicine | Admitting: Internal Medicine

## 2019-10-04 ENCOUNTER — Other Ambulatory Visit (HOSPITAL_COMMUNITY): Payer: Medicare HMO

## 2019-10-04 DIAGNOSIS — R0600 Dyspnea, unspecified: Secondary | ICD-10-CM

## 2019-10-04 DIAGNOSIS — J9621 Acute and chronic respiratory failure with hypoxia: Secondary | ICD-10-CM | POA: Diagnosis present

## 2019-10-04 DIAGNOSIS — R52 Pain, unspecified: Secondary | ICD-10-CM

## 2019-10-04 DIAGNOSIS — J449 Chronic obstructive pulmonary disease, unspecified: Secondary | ICD-10-CM | POA: Diagnosis present

## 2019-10-04 DIAGNOSIS — R58 Hemorrhage, not elsewhere classified: Secondary | ICD-10-CM

## 2019-10-04 DIAGNOSIS — J9690 Respiratory failure, unspecified, unspecified whether with hypoxia or hypercapnia: Secondary | ICD-10-CM

## 2019-10-04 DIAGNOSIS — I2510 Atherosclerotic heart disease of native coronary artery without angina pectoris: Secondary | ICD-10-CM | POA: Diagnosis present

## 2019-10-04 DIAGNOSIS — J189 Pneumonia, unspecified organism: Secondary | ICD-10-CM

## 2019-10-04 DIAGNOSIS — R4702 Dysphasia: Secondary | ICD-10-CM

## 2019-10-04 DIAGNOSIS — I5022 Chronic systolic (congestive) heart failure: Secondary | ICD-10-CM | POA: Diagnosis present

## 2019-10-04 DIAGNOSIS — Z931 Gastrostomy status: Secondary | ICD-10-CM

## 2019-10-04 DIAGNOSIS — T85598A Other mechanical complication of other gastrointestinal prosthetic devices, implants and grafts, initial encounter: Secondary | ICD-10-CM

## 2019-10-04 HISTORY — DX: Acute and chronic respiratory failure with hypoxia: J96.21

## 2019-10-04 HISTORY — DX: Chronic obstructive pulmonary disease, unspecified: J44.9

## 2019-10-04 HISTORY — DX: Heart failure, unspecified: I50.9

## 2019-10-04 HISTORY — DX: Atherosclerotic heart disease of native coronary artery without angina pectoris: I25.10

## 2019-10-04 HISTORY — DX: Chronic systolic (congestive) heart failure: I50.22

## 2019-10-04 LAB — BASIC METABOLIC PANEL
Anion gap: 7 (ref 5–15)
BUN: 28 mg/dL — ABNORMAL HIGH (ref 8–23)
CO2: 30 mmol/L (ref 22–32)
Calcium: 9.5 mg/dL (ref 8.9–10.3)
Chloride: 100 mmol/L (ref 98–111)
Creatinine, Ser: 0.69 mg/dL (ref 0.44–1.00)
GFR calc Af Amer: >60 mL/min (ref >60)
GFR calc non Af Amer: >60 mL/min (ref >60)
Glucose, Bld: 111 mg/dL — ABNORMAL HIGH (ref 70–99)
Potassium: 3.8 mmol/L (ref 3.5–5.1)
Sodium: 137 mmol/L (ref 135–145)

## 2019-10-04 LAB — CBC
HCT: 27.9 % — ABNORMAL LOW (ref 36.0–46.0)
Hemoglobin: 8.1 g/dL — ABNORMAL LOW (ref 12.0–15.0)
MCH: 27.8 pg (ref 26.0–34.0)
MCHC: 29 g/dL — ABNORMAL LOW (ref 30.0–36.0)
MCV: 95.9 fL (ref 80.0–100.0)
Platelets: 281 10*3/uL (ref 150–400)
RBC: 2.91 MIL/uL — ABNORMAL LOW (ref 3.87–5.11)
RDW: 19.8 % — ABNORMAL HIGH (ref 11.5–15.5)
WBC: 7.8 10*3/uL (ref 4.0–10.5)
nRBC: 0 % (ref 0.0–0.2)

## 2019-10-04 LAB — GLUCOSE, CAPILLARY
Glucose-Capillary: 118 mg/dL — ABNORMAL HIGH (ref 70–99)
Glucose-Capillary: 102 mg/dL — ABNORMAL HIGH (ref 70–99)
Glucose-Capillary: 124 mg/dL — ABNORMAL HIGH (ref 70–99)
Glucose-Capillary: 119 mg/dL — ABNORMAL HIGH (ref 70–99)

## 2019-10-04 MED ORDER — VITAMIN D (ERGOCALCIFEROL) 1.25 MG (50000 UNIT) PO CAPS: 50000.0000 [IU] | ORAL_CAPSULE | ORAL | Status: AC

## 2019-10-04 MED ORDER — REVEFENACIN 175 MCG/3ML IN SOLN: 175.0000 ug | Freq: Every day | RESPIRATORY_TRACT | Status: AC

## 2019-10-04 MED ORDER — PANTOPRAZOLE SODIUM 40 MG PO PACK: 40.0000 mg | PACK | Freq: Every day | ORAL | Status: AC

## 2019-10-04 MED ORDER — QUETIAPINE FUMARATE 25 MG PO TABS: 25.0000 mg | ORAL_TABLET | Freq: Two times a day (BID) | ORAL | Status: AC

## 2019-10-04 MED ORDER — ACETAMINOPHEN 325 MG PO TABS: 650.0000 mg | ORAL_TABLET | Freq: Four times a day (QID) | ORAL | Status: AC | PRN

## 2019-10-04 MED ORDER — ALBUTEROL SULFATE (2.5 MG/3ML) 0.083% IN NEBU: 2.5000 mg | INHALATION_SOLUTION | RESPIRATORY_TRACT | 12 refills | Status: AC | PRN

## 2019-10-04 MED ORDER — ENOXAPARIN SODIUM 40 MG/0.4ML ~~LOC~~ SOLN: 40.0000 mg | SUBCUTANEOUS | Status: DC

## 2019-10-04 MED ORDER — BUDESONIDE 0.25 MG/2ML IN SUSP: 0.2500 mg | Freq: Two times a day (BID) | RESPIRATORY_TRACT | 12 refills | Status: AC

## 2019-10-04 MED ORDER — GUAIFENESIN 100 MG/5ML PO SOLN: 15.0000 mL | Freq: Four times a day (QID) | ORAL | 0 refills | Status: AC

## 2019-10-04 MED ORDER — ROSUVASTATIN CALCIUM 20 MG PO TABS: 20.0000 mg | ORAL_TABLET | Freq: Every day | ORAL | Status: AC

## 2019-10-04 MED ORDER — DOCUSATE SODIUM 50 MG/5ML PO LIQD: 100.0000 mg | Freq: Two times a day (BID) | ORAL | 0 refills | Status: AC

## 2019-10-04 MED ORDER — CYANOCOBALAMIN 1000 MCG PO TABS: 1000.0000 ug | ORAL_TABLET | ORAL | Status: AC

## 2019-10-04 MED ORDER — ARFORMOTEROL TARTRATE 15 MCG/2ML IN NEBU: 15.0000 ug | INHALATION_SOLUTION | Freq: Two times a day (BID) | RESPIRATORY_TRACT | Status: AC

## 2019-10-04 MED ORDER — INSULIN ASPART 100 UNIT/ML ~~LOC~~ SOLN: 0.0000 [IU] | SUBCUTANEOUS | 11 refills | Status: AC

## 2019-10-04 MED ORDER — FREE WATER: 100.0000 mL | Freq: Three times a day (TID) | Status: AC

## 2019-10-04 MED ORDER — VITAL 1.5 CAL PO LIQD: 1000.0000 mL | ORAL | Status: AC

## 2019-10-04 MED ORDER — TICAGRELOR 90 MG PO TABS: 90.0000 mg | ORAL_TABLET | Freq: Two times a day (BID) | ORAL | Status: DC

## 2019-10-04 MED ORDER — POLYETHYLENE GLYCOL 3350 17 G PO PACK: 17.0000 g | PACK | Freq: Every day | ORAL | 0 refills | Status: AC

## 2019-10-04 MED ORDER — OXYCODONE HCL 10 MG PO TABS: 5.0000 mg | ORAL_TABLET | Freq: Four times a day (QID) | ORAL | 0 refills | Status: AC | PRN

## 2019-10-04 MED ORDER — CEFAZOLIN SODIUM-DEXTROSE 2-4 GM/100ML-% IV SOLN: 2.0000 g | Freq: Three times a day (TID) | INTRAVENOUS | 0 refills | Status: AC

## 2019-10-04 MED ORDER — CLONAZEPAM 0.5 MG PO TABS: 0.5000 mg | ORAL_TABLET | Freq: Two times a day (BID) | ORAL | 0 refills | Status: AC

## 2019-10-04 NOTE — Progress Notes (Signed)
[facility]  PROGRESS NOTE  Chelsea Allison VCB:[id number] DOB: 20-Sep-1955 DOA: 09/05/2019 PCP: Beckie Salts, MD   Brief HPI:   64 year old female with past medical history of CAD s/p PCI in October 2020, COPD, dyslipidemia, hypertension, non-small cell lung cancer stage III status post partial lung resection, anal cancer status post colectomy was admitted to the hospital with acute on chronic hypoxic/hypercapnic respiratory failure from pneumonia and COPD.  Patient is status post tracheostomy.  She is currently requiring mechanical ventilation.  PCCM is following.    Subjective   Patient seen and examined, getting chest PT. Patient is on vent support, did not tolerate any weaning yesterday .   Assessment/Plan:     1. Acute on chronic hypoxemic/hypercapnic respiratory failure-secondary to COPD exacerbation, right upper lobe pneumonia.  Patient underwent tracheostomy on 09/12/2019 for inability to wean off ventilator and repeated intubations..  Patient received antibiotic course for E. coli/Klebsiella pneumonia.  Currently patient is requiring mechanical ventilation.  Vent management per PCCM, so far difficult to wean. 2. COPD exacerbation-patient was transferred out of ICU and to ICU on 09/25/2019 after mucous plugging event.  Continue bronchodilators, chest PT, wean ventilator as tolerated.  Steroids per PCCM.  3.  Chronic cancer-related pain/anxiety-continue oxycodone 10 mg every 8 hours, buprenorphine patch, Seroquel 12.5 mg p.o. twice daily. 4. Chronic HFrEF-patient has history of CAD, EF 35 to 40% on echo from 08/12/2019.  Currently not requiring diuretics.  Continue aspirin, Brilinta. 5. Anxiety/agitation-patient became anxious this morning, she is currently on vent.  Continue  Ativan 1 mg IV every 4 hours as needed for agitation/anxiety. 6. Diabetes mellitus type 2-blood sugar stable, continue sliding scale insulin with NovoLog.  CBG well controlled. 7. History of non-small cell lung  cancer-status post partial lung resection.  Follow-up oncology as outpatient. 8. History of rectal cancer s/p colectomy-stable 9. Iron deficiency anemia-hemoglobin is stable, transfuse for hemoglobin less than 7.  No clear source of bleeding.    Scheduled medications:   . arformoterol  15 mcg Nebulization BID  . vitamin C  250 mg Per Tube BID  . aspirin  81 mg Per Tube Daily  . budesonide (PULMICORT) nebulizer solution  0.25 mg Nebulization BID  . buprenorphine  2 patch Transdermal Weekly  . chlorhexidine gluconate (MEDLINE KIT)  15 mL Mouth Rinse BID  . Chlorhexidine Gluconate Cloth  6 each Topical Daily  . clonazePAM  0.5 mg Per Tube BID  . collagenase   Topical Daily  . docusate  100 mg Per Tube BID  . enoxaparin (LOVENOX) injection  40 mg Subcutaneous Q24H  . feeding supplement (PROSource TF)  45 mL Per Tube TID  . free water  100 mL Per Tube Q8H  . guaiFENesin  15 mL Per Tube Q6H  . insulin aspart  0-15 Units Subcutaneous Q4H  . mouth rinse  15 mL Mouth Rinse 10 times per day  . nutrition supplement (JUVEN)  1 packet Per Tube BID BM  . pantoprazole sodium  40 mg Per Tube Daily  . polyethylene glycol  17 g Per Tube Daily  . QUEtiapine  25 mg Per Tube BID  . revefenacin  175 mcg Nebulization Daily  . rosuvastatin  20 mg Per Tube Daily  . ticagrelor  90 mg Per Tube BID  . vitamin B-12  1,000 mcg Per Tube QODAY  . Vitamin D (Ergocalciferol)  50,000 Units Per Tube Q7 days  . zinc sulfate  220 mg Per Tube Daily     Antibiotics:  Anti-infectives (From admission, onward)   Start     Dose/Rate Route Frequency Ordered Stop   09/29/19 1800  ceFAZolin (ANCEF) IVPB 2g/100 mL premix     Discontinue     2 g 200 mL/hr over 30 Minutes Intravenous Every 8 hours 09/29/19 0915 10/05/19 0159   09/28/19 1630  cefTRIAXone (ROCEPHIN) 1 g in sodium chloride 0.9 % 100 mL IVPB  Status:  Discontinued        1 g 200 mL/hr over 30 Minutes Intravenous Every 24 hours 09/28/19 1630 09/29/19 0915    09/18/19 1000  ceFAZolin (ANCEF) IVPB 2g/100 mL premix        2 g 200 mL/hr over 30 Minutes Intravenous Every 8 hours 09/18/19 0933 09/25/19 0406   09/06/19 0830  fluconazole (DIFLUCAN) tablet 150 mg        150 mg Per Tube  Once 09/06/19 0743 09/06/19 0834   09/05/19 2000  vancomycin (VANCOCIN) IVPB 1000 mg/200 mL premix  Status:  Discontinued        1,000 mg 200 mL/hr over 60 Minutes Intravenous Every 12 hours 09/05/19 1048 09/06/19 0743   09/05/19 1400  piperacillin-tazobactam (ZOSYN) IVPB 3.375 g  Status:  Discontinued        3.375 g 12.5 mL/hr over 240 Minutes Intravenous Every 8 hours 09/05/19 1048 09/09/19 1159   09/05/19 0830  vancomycin (VANCOCIN) IVPB 1000 mg/200 mL premix        1,000 mg 200 mL/hr over 60 Minutes Intravenous  Once 09/05/19 0827 09/05/19 1033   09/05/19 0830  piperacillin-tazobactam (ZOSYN) IVPB 3.375 g        3.375 g 12.5 mL/hr over 240 Minutes Intravenous  Once 09/05/19 0827 09/05/19 1117      CBG: Recent Labs  Lab 10/03/19 1524 10/03/19 1919 10/03/19 2339 10/04/19 0354 10/04/19 0820  GLUCAP 103* 97 99 118* 102*    SpO2: 99 % O2 Flow Rate (L/min): 8 L/min FiO2 (%): 40 %    CBC: Recent Labs  Lab 09/30/19 0453 10/01/19 0310 10/02/19 1043 10/03/19 0321 10/04/19 0144  WBC 7.0 8.1 17.4* 9.9 7.8  HGB 8.1* 8.2* 10.2* 8.1* 8.1*  HCT 29.0* 27.8* 36.4 28.3* 27.9*  MCV 95.1 94.9 96.0 95.6 95.9  PLT 283 288 459* 271 124    Basic Metabolic Panel: Recent Labs  Lab 10/01/19 0310 10/01/19 0603 10/02/19 1043 10/02/19 1332 10/03/19 0321 10/04/19 0144  NA 136 136 131*  --  137 137  K 3.6 4.0 4.6  --  4.0 3.8  CL 99 101 95*  --  102 100  CO2 '28 28 26  ' --  25 30  GLUCOSE 139* 132* 276*  --  126* 111*  BUN 30* 32* 27*  --  30* 28*  CREATININE 0.69 0.62 0.79  --  0.62 0.69  CALCIUM 9.6 9.5 9.5  --  9.2 9.5  MG  --   --   --  2.4  --   --   PHOS  --   --   --  5.4*  --   --      Liver Function Tests: Recent Labs  Lab 09/30/19 0453   AST 17  ALT 11  ALKPHOS 67  BILITOT 0.2*  PROT 5.9*  ALBUMIN 2.5*      DVT prophylaxis: Lovenox  Code Status: Full code  Family Communication: No family at bedside    Status is: Inpatient  Dispo: The patient is from: Home  Anticipated d/c is to: LTAC              Anticipated d/c date is: 10/04/2019              Patient currently not medically stable for discharge, requiring mechanical ventilation  Barrier to discharge-currently requiring mechanical ventilation, not ready for discharge.  Pressure Injury 09/07/19 Sacrum Unstageable - Full thickness tissue loss in which the base of the injury is covered by slough (yellow, tan, gray, green or brown) and/or eschar (tan, brown or black) in the wound bed. (Active)  09/07/19 1000  Location: Sacrum  Location Orientation:   Staging: Unstageable - Full thickness tissue loss in which the base of the injury is covered by slough (yellow, tan, gray, green or brown) and/or eschar (tan, brown or black) in the wound bed.  Wound Description (Comments):   Present on Admission: Yes     Pressure Injury 09/10/19 Sacrum Lower Stage 3 -  Full thickness tissue loss. Subcutaneous fat may be visible but bone, tendon or muscle are NOT exposed. 1x1x1 (Active)  09/10/19 1355  Location: Sacrum  Location Orientation: Lower  Staging: Stage 3 -  Full thickness tissue loss. Subcutaneous fat may be visible but bone, tendon or muscle are NOT exposed.  Wound Description (Comments): 1x1x1  Present on Admission:      Pressure Injury 09/10/19 Lip Left skin breakdown from ETT (Active)  09/10/19 0700  Location: Lip  Location Orientation: Left  Staging:   Wound Description (Comments): skin breakdown from ETT  Present on Admission:     Consultants:  PCCM  Procedures:  S/p tracheostomy   Objective   Vitals:   10/04/19 0500 10/04/19 0514 10/04/19 0700 10/04/19 0826  BP: (!) 89/45  (!) 98/56   Pulse:  86 76   Resp:  (!) 27 (!) 25    Temp:    98.2 F (36.8 C)  TempSrc:    Oral  SpO2:  100% 99%   Weight:      Height:        Intake/Output Summary (Last 24 hours) at 10/04/2019 0857 Last data filed at 10/04/2019 0100 Gross per 24 hour  Intake 1286.25 ml  Output 1150 ml  Net 136.25 ml    07/26 1901 - 07/28 0700 In: 2366.3  Out: 1900 [Urine:1300]  Filed Weights   09/20/19 0500 09/25/19 0122 09/27/19 0600  Weight: 79.5 kg 82.1 kg 82.7 kg    Physical Examination:  Awake Alert, Oriented X 3, No new F.N deficits, Normal affect Tracheostomy in place, no discharge or oozing Symmetrical Chest wall movement, Good air movement bilaterally, mechanically vented respiratory sounds RRR,No Gallops,Rubs or new Murmurs, No Parasternal Heave +ve B.Sounds, Abd Soft, No tenderness, No rebound - guarding or rigidity. No Cyanosis, Clubbing or edema, No new Rash or bruise     Data Reviewed:   Recent Results (from the past 240 hour(s))  Culture, respiratory (non-expectorated)     Status: None   Collection Time: 09/26/19  9:46 AM   Specimen: Tracheal Aspirate; Respiratory  Result Value Ref Range Status   Specimen Description TRACHEAL ASPIRATE  Final   Special Requests Normal  Final   Gram Stain   Final    RARE WBC PRESENT, PREDOMINANTLY PMN FEW GRAM NEGATIVE RODS FEW GRAM POSITIVE COCCI IN PAIRS Performed at [facility], [place] [address] [place], [place] [address]    Culture FEW ESCHERICHIA COLI  Final   Report Status 09/30/2019 FINAL  Final   Organism ID,  Bacteria ESCHERICHIA COLI  Final      Susceptibility   Escherichia coli - MIC*    AMPICILLIN <=2 SENSITIVE Sensitive     CEFAZOLIN <=4 SENSITIVE Sensitive     CEFEPIME <=0.12 SENSITIVE Sensitive     CEFTAZIDIME <=1 SENSITIVE Sensitive     CEFTRIAXONE <=0.25 SENSITIVE Sensitive     CIPROFLOXACIN 1 SENSITIVE Sensitive     GENTAMICIN <=1 SENSITIVE Sensitive     IMIPENEM <=0.25 SENSITIVE Sensitive     TRIMETH/SULFA <=20 SENSITIVE Sensitive      AMPICILLIN/SULBACTAM <=2 SENSITIVE Sensitive     PIP/TAZO <=4 SENSITIVE Sensitive     * FEW ESCHERICHIA COLI    No results for input(s): LIPASE, AMYLASE in the last 168 hours. No results for input(s): AMMONIA in the last 168 hours.  Cardiac Enzymes: No results for input(s): CKTOTAL, CKMB, CKMBINDEX, TROPONINI in the last 168 hours. BNP (last 3 results) Recent Labs    08/10/19 2150 08/13/19 0225 09/05/19 0735  BNP 318.9* 929.4* 2,477.5*     Palmira Stickle   [facility] If 7PM-7AM, please contact night-coverage at [email or web address], Office  [phone]   10/04/2019, 8:57 AM  LOS: 29 days

## 2019-10-04 NOTE — Progress Notes (Signed)
NAME:  Chelsea Allison, MRN:  [id number], DOB:  14-Nov-1955, LOS: 24 ADMISSION DATE:  09/05/2019, CONSULTATION DATE:  6/29 REFERRING MD:  Stark Jock, CHIEF COMPLAINT:  Dyspnea   Brief History   64 yo female smoker presented to ED with altered mental status from acute on chronic hypoxic/hypercapnic respiratory failure 2nd to HCAP in setting of COPD. Has had prolonged mechanical ventilation, required tracheostomy.  Past Medical History  NSCLC s/p partial resection Anal cancer s/p colostomy CAD with MI s/p PCI COPD on home O2 HFrEF Tobacco abuse   Significant Hospital Events   6/29 admitted to [facility]  7/1 hgb drop.  7/2 SBTx4h 7/4 only tolerated 26min on pressure support. Starting precedex/klonopin. Obtaining hemolytic labs. 7/6 trached 7/9: Copious secretions still noted.  Tachypneic.  Mildly hypotensive.  Presuming anxiety complicating weaning efforts titration of anxiolytic regimen performed, starting aerosol trach collar trials 7/10: Was on trach collar, pulse oximetry dropping to 40s supplemental oxygen increase but still on the needed achieve 70% FiO2 placed back on ventilator lavaged for pink-tan secretions saturations improved back on vent, checking sputum culture and chest x-ray. 7/11 looks better. Working on weaning  7/19 transfer back to ICU after severe desaturations and also hypercarbic failure.  7/21 no overnight events 7/22 on vent for desaturation to 60s with mucus plugging event 7/24 started prednisone for wheezing/increased WOB 7/27 PS trials, failed TCT yesterday   Consults:  PCCM  Procedures:  ETT 6/29-7/6 Tracheostomy 7/6  Significant Diagnostic Tests:    Micro Data:  6/29 SARS COV 2 > neg 6/29 urine culture > insignificant growth 6/29 blood > negative 7/10 resp culture > e coli, klebsiella pneumoniae 7/20 resp culture > E coli, suspect colonization  Antimicrobials:  6/29 vancomycin>6/30 6/29 pip/tazo> 7/3 Ancef 7/12 -7/18 CTX 7/22-7/23 Ancef 7/23>    Interim history/subjective:   Long discussion this morning at bedside with the patient about options and length of recovery   Objective   Blood pressure 113/70, pulse 95, temperature 98.2 F (36.8 C), temperature source Oral, resp. rate 22, height 5\' 9"  (1.753 m), weight 82.7 kg, SpO2 100 %.    Vent Mode: PSV;CPAP FiO2 (%):  [40 %-50 %] 40 % Set Rate:  [24 bmp] 24 bmp Vt Set:  [560 mL] 560 mL PEEP:  [5 cmH20] 5 cmH20 Pressure Support:  [15 cmH20] 15 cmH20 Plateau Pressure:  [23 cmH20-32 cmH20] 23 cmH20   Intake/Output Summary (Last 24 hours) at 10/04/2019 1110 Last data filed at 10/04/2019 1017 Gross per 24 hour  Intake 1686.25 ml  Output 1150 ml  Net 536.25 ml   Filed Weights   09/20/19 0500 09/25/19 0122 09/27/19 0600  Weight: 79.5 kg 82.1 kg 82.7 kg    Examination:  General:  Elderly fm, resting comfortably on vent  HENT: NCAT sclera clear  PULM: no wheezing, BL vented breaths  CV: RRR, no mrg  GI: BS+, soft nt nd   MSK: no edema  Neuro: AAOX3, moves all 4 extremities   Resolved Hospital Problem list     Assessment & Plan:   Chronic respiratory failure with hypercarbia and hypoxemia, ventilator dependent  COPD with acute exacerbation, now resolved  Anxiety  S/p Tracheostomy status - continue PS trial  - dropped PS to 10 above this morning  - tolerating ok - if still doing ok later today can swap to TCT and see how she does  - continue anxiety control  - pulmonary following fro vent management  - she will likely need LTACH and prolonged  vent wean support   Vent settings reviewed:  Vent Mode: PSV;CPAP FiO2 (%):  [40 %-50 %] 40 % Set Rate:  [24 bmp] 24 bmp Vt Set:  [560 mL] 560 mL PEEP:  [5 cmH20] 5 cmH20 Pressure Support:  [15 cmH20] 15 cmH20 Plateau Pressure:  [23 cmH20-32 cmH20] 23 cmH20  Chronic HFrEF-patient has history of CAD EF 35 to 40% on echo from 08/12/2019 - diuresis per primary to maintain euvolemia   Best practice:   Per [facility]  Labs    CBC: Recent Labs  Lab 09/30/19 0453 10/01/19 0310 10/02/19 1043 10/03/19 0321 10/04/19 0144  WBC 7.0 8.1 17.4* 9.9 7.8  HGB 8.1* 8.2* 10.2* 8.1* 8.1*  HCT 29.0* 27.8* 36.4 28.3* 27.9*  MCV 95.1 94.9 96.0 95.6 95.9  PLT 283 288 459* 271 700    Basic Metabolic Panel: Recent Labs  Lab 10/01/19 0310 10/01/19 0603 10/02/19 1043 10/02/19 1332 10/03/19 0321 10/04/19 0144  NA 136 136 131*  --  137 137  K 3.6 4.0 4.6  --  4.0 3.8  CL 99 101 95*  --  102 100  CO2 28 28 26   --  25 30  GLUCOSE 139* 132* 276*  --  126* 111*  BUN 30* 32* 27*  --  30* 28*  CREATININE 0.69 0.62 0.79  --  0.62 0.69  CALCIUM 9.6 9.5 9.5  --  9.2 9.5  MG  --   --   --  2.4  --   --   PHOS  --   --   --  5.4*  --   --    GFR: Estimated Creatinine Clearance: 82.7 mL/min (by C-G formula based on SCr of 0.69 mg/dL). Recent Labs  Lab 10/01/19 0310 10/02/19 1043 10/03/19 0321 10/04/19 0144  WBC 8.1 17.4* 9.9 7.8    Liver Function Tests: Recent Labs  Lab 09/30/19 0453  AST 17  ALT 11  ALKPHOS 67  BILITOT 0.2*  PROT 5.9*  ALBUMIN 2.5*   No results for input(s): LIPASE, AMYLASE in the last 168 hours. No results for input(s): AMMONIA in the last 168 hours.  ABG    Component Value Date/Time   PHART 7.363 09/26/2019 0142   PCO2ART 52.4 (H) 09/26/2019 0142   PO2ART 68 (L) 09/26/2019 0142   HCO3 29.7 (H) 09/26/2019 0142   TCO2 31 09/26/2019 0142   ACIDBASEDEF 1.0 08/15/2019 1944   O2SAT 92.0 09/26/2019 0142     Coagulation Profile: No results for input(s): INR, PROTIME in the last 168 hours.  Cardiac Enzymes: No results for input(s): CKTOTAL, CKMB, CKMBINDEX, TROPONINI in the last 168 hours.  HbA1C: Hgb A1c MFr Bld  Date/Time Value Ref Range Status  08/11/2019 08:00 AM 5.8 (H) 4.8 - 5.6 % Final    Comment:    (NOTE) Pre diabetes:          5.7%-6.4% Diabetes:              >6.4% Glycemic control for   <7.0% adults with diabetes   01/10/2019 04:12 AM 5.7 (H) 4.8 - 5.6 % Final     Comment:    (NOTE) Pre diabetes:          5.7%-6.4% Diabetes:              >6.4% Glycemic control for   <7.0% adults with diabetes     CBG: Recent Labs  Lab 10/03/19 1524 10/03/19 1919 10/03/19 2339 10/04/19 0354 10/04/19 0820  GLUCAP 103* 97 99  [facility] 10/04/2019 11:10 AM

## 2019-10-04 NOTE — Discharge Summary (Signed)
Chelsea Allison, is a 64 y.o. female  DOB 03/24/1955  MRN [id number].  Admission date:  09/05/2019  Admitting Physician  Chesley Mires, MD  Discharge Date:  10/04/2019   Primary MD  Beckie Salts, MD  Recommendations for primary care physician for things to follow:  -Further management per [facility].  Admission Diagnosis  COPD exacerbation [facility]) [J44.1] Acute respiratory failure with hypercapnia ([place]) [J96.02] Acute on chronic respiratory failure with hypoxia and hypercapnia ([place]) [J96.21, J96.22]   Discharge Diagnosis  COPD exacerbation ([place]) [J44.1] Acute respiratory failure with hypercapnia (HCC) [J96.02] Acute on chronic respiratory failure with hypoxia and hypercapnia (HCC) [J96.21, J96.22]   Principal Problem:   Acute on chronic respiratory failure with hypoxia and hypercapnia ([place]) Active Problems:   COPD exacerbation (HCC)   Respiratory failure (HCC)   History of lung cancer in adulthood   History of anal cancer   Pressure injury of skin   Status post tracheostomy [facility])   Essential hypertension   Dyslipidemia      Past Medical History:  Diagnosis Date  . CAD (coronary artery disease) 10/20 DES to pRCA, DES mCx   . Cancer of lung ([place])    Twice  . Colon cancer ([place])   . COPD (chronic obstructive pulmonary disease) ([place])   . Hyperlipidemia   . Hypertension   . Ischemic cardiomyopathy   . Neuropathy    Secondary to chemo  . STEMI (ST elevation myocardial infarction) ([place]) 12/12/2018  . Tobacco abuse     Past Surgical History:  Procedure Laterality Date  . ABDOMINAL SURGERY     For colon cancer  . COLOSTOMY    . CORONARY STENT INTERVENTION N/A 12/20/2018   Procedure: CORONARY STENT INTERVENTION - Right;  Surgeon: Burnell Blanks, MD;  Location: [place] [facility];  Service: Cardiovascular;  Laterality: N/A;  . CORONARY/GRAFT ACUTE MI REVASCULARIZATION N/A 12/12/2018    Procedure: Coronary/Graft Acute MI Revascularization;  Surgeon: Belva Crome, MD;  Location: [place] [facility];  Service: Cardiovascular;  Laterality: N/A;  . LEFT HEART CATH AND CORONARY ANGIOGRAPHY N/A 12/12/2018   Procedure: LEFT HEART CATH AND CORONARY ANGIOGRAPHY;  Surgeon: Belva Crome, MD;  Location: [place] [facility];  Service: Cardiovascular;  Laterality: N/A;  . LEFT HEART CATH AND CORONARY ANGIOGRAPHY N/A 08/11/2019   Procedure: LEFT HEART CATH AND CORONARY ANGIOGRAPHY;  Surgeon: Burnell Blanks, MD;  Location: [place] [facility];  Service: Cardiovascular;  Laterality: N/A;       History of present illness and  Hospital Course:     Kindly see H&P for history of present illness and admission details, please review complete Labs, Consult reports and Test reports for all details in brief  HPI  from the history and physical done on the day of admission 08/10/2019   HPI: Chelsea Allison is a 64 y.o. female with medical history significant of CAD/ STEMI in October 2020 status post PCI, COPD, hypertension, hyperlipidemia, CHF, non-small cell lung cancer stage III status post partial lung resection,  anal cancer status post colostomy presented to the ED via EMS for evaluation of respiratory distress.  Hypoxic and placed on CPAP by EMS.  Also given sublingual nitroglycerin x1 and IV magnesium 2 g.  Patient reports having intermittent spells of shortness of breath since her MI last year.  States last night she again experienced short of breath and started wheezing.  She could barely breathe and had to call EMS.  Denies any associated chest pain or cough.  Denies fevers or chills.  Reports using Breo regularly for her COPD and albuterol as needed.  ED Course: Upon arrival to the ED, patient was unresponsive and satting 50% on CPAP.  She was placed on BiPAP and became more responsive after 30 minutes.   Afebrile.  Tachycardic and significantly tachypneic on arrival.  WBC count 12.2.   Blood glucose 362.  BUN 20, creatinine 1.3.  Baseline creatinine 0.8.  BNP 318.  High-sensitivity troponin 73.  EKG without acute ischemic changes.  SARS-CoV-2 PCR test negative.  Chest x-ray personally reviewed showing increased density at the right hilum.  CT angiogram negative for PE.  Showing mild groundglass tree-in-bud opacities throughout both lungs which may be due to infectious or inflammatory process.  Stable ill-defined pulmonary nodules in the posterior left lower lobe and right middle lobe.   ABG done a few hours later with pH 7.32, PCO2 53, and PO2 117.  Patient was given IV Lasix 40 mg.  Hospital Course   64 yo female smoker presented to ED with altered mental status from acute on chronic hypoxic/hypercapnic respiratory failure 2nd to HCAP in setting of COPD. Has had prolonged mechanical ventilation, required tracheostomy.  Significant events 6/29 admitted to [facility]  7/1 hgb drop.  7/2 SBTx4h 7/4 only tolerated 31min on pressure support. Starting precedex/klonopin. Obtaining hemolytic labs. 7/6 trached 7/9: Copious secretions still noted. Tachypneic. Mildly hypotensive. Presuming anxiety complicating weaning efforts titration of anxiolytic regimen performed, starting aerosol trach collar trials 7/10: Was on trach collar, pulse oximetry dropping to 40s supplemental oxygen increase but still on the needed achieve 70% FiO2 placed back on ventilator lavaged for pink-tan secretions saturations improved back on vent, checking sputum culture and chest x-ray. 7/11 looks better. Working on weaning  7/19 transfer back to ICU after severe desaturations and also hypercarbic failure.  7/21 no overnight events 7/22 on vent for desaturation to 60s with mucus plugging event 7/24 started prednisone for wheezing/increased WOB 7/27 PS trials, failed TCT day before   Microbiology data 6/29 SARS COV 2 > neg 6/29 urine culture > insignificant growth 6/29 blood > negative 7/10 resp  culture > e coli, klebsiella pneumoniae 7/20 resp culture > E coli, suspect colonization  Antimicrobials 6/29 vancomycin>6/30 6/29 pip/tazo> 7/3 Ancef 7/12 -7/18 CTX 7/22-7/23 Ancef 7/23>   Acute on chronic hypoxemic/hypercapnic respiratory failure, ventilator dependent -secondary to COPD exacerbation, right upper lobe pneumonia.  Patient underwent tracheostomy on 09/12/2019 for inability to wean off ventilator and repeated intubations..  Patient received antibiotic course for E. coli/Klebsiella pneumonia.  And she remains on IV Ancef, Currently patient is requiring mechanical ventilation.  Vent management per PCCM, so far difficult to wean.  Likely due to anxiety as well, she was started on clonazepam and Seroquel, patient with transfer to select LTAC as it anticipated she will need prolonged weaning. -Now continue with tube feed, hopefully if she weaned and can tolerate PMV can be transitioned to oral, otherwise she will need PEG.  COPD exacerbation-patient was transferred out of ICU and  to ICU on 09/25/2019 after mucous plugging event.  Continue bronchodilators, chest PT, wean ventilator as tolerated.    Did finish her steroid treatment   Chronic cancer-related pain/anxiety-continue oxycodone as needed, buprenorphine patch, Seroquel twice daily.  And clonazepam  Chronic HFrEF-patient has history of CAD, EF 35 to 40% on echo from 08/12/2019.  Currently not requiring diuretics.  Continue aspirin, Brilinta.  Diuresis as needed.  Diabetes mellitus type 2-blood sugar stable, continue sliding scale insulin with NovoLog.  CBG well controlled.  History of non-small cell lung cancer-status post partial lung resection.  Follow-up oncology as outpatient.  History of rectal cancer s/p colectomy-stable  Iron deficiency anemia-hemoglobin is stable, transfuse for hemoglobin less than 7.  No clear source of bleeding.  Pressure Ulcers: -Continue with wound care  Pressure Injury 09/07/19 Sacrum  Unstageable - Full thickness tissue loss in which the base of the injury is covered by slough (yellow, tan, gray, green or brown) and/or eschar (tan, brown or black) in the wound bed. (Active)  09/07/19 1000  Location: Sacrum  Location Orientation:   Staging: Unstageable - Full thickness tissue loss in which the base of the injury is covered by slough (yellow, tan, gray, green or brown) and/or eschar (tan, brown or black) in the wound bed.  Wound Description (Comments):   Present on Admission: Yes     Pressure Injury 09/10/19 Sacrum Lower Stage 3 -  Full thickness tissue loss. Subcutaneous fat may be visible but bone, tendon or muscle are NOT exposed. 1x1x1 (Active)  09/10/19 1355  Location: Sacrum  Location Orientation: Lower  Staging: Stage 3 -  Full thickness tissue loss. Subcutaneous fat may be visible but bone, tendon or muscle are NOT exposed.  Wound Description (Comments): 1x1x1  Present on Admission:      Pressure Injury 09/10/19 Lip Left skin breakdown from ETT (Active)  09/10/19 0700  Location: Lip  Location Orientation: Left  Staging:   Wound Description (Comments): skin breakdown from ETT  Present on Admission:         Discharge Condition:  -is vent dependent respiratory failure at the time of discharge    Discharge Instructions  and  Discharge Medications    Discharge Instructions    Discharge wound care:   Complete by: As directed    Management per [place] as of 10/04/2019      Reactions   Statins Other (See Comments)   Hydrochlorothiazide Other (See Comments)   headaches   Hydrocodone Nausea And Vomiting   Lisinopril Other (See Comments)   Headache   Oxycodone Nausea Only      Medication List    STOP taking these medications   albuterol 108 (90 Base) MCG/ACT inhaler Commonly known as: VENTOLIN HFA Replaced by: albuterol (2.5 MG/3ML) 0.083% nebulizer solution   ALPRAZolam 0.5 MG tablet Commonly known as:  XANAX Replaced by: clonazePAM 0.5 MG tablet   aspirin 81 MG chewable tablet   B-12 1000 MCG Caps Replaced by: cyanocobalamin 1000 MCG tablet   docusate sodium 100 MG capsule Commonly known as: COLACE Replaced by: docusate 50 MG/5ML liquid   furosemide 20 MG tablet Commonly known as: LASIX   guaiFENesin 600 MG 12 hr tablet Commonly known as: MUCINEX Replaced by: guaiFENesin 100 MG/5ML Soln   ipratropium-albuterol 0.5-2.5 (3) MG/3ML Soln Commonly known as: DUONEB   losartan 25 MG tablet Commonly known as: COZAAR   melatonin 3 MG Tabs tablet   metoprolol succinate 25 MG 24 hr tablet  Commonly known as: TOPROL-XL   multivitamin with minerals Tabs tablet   pantoprazole 40 MG tablet Commonly known as: PROTONIX Replaced by: pantoprazole sodium 40 mg/20 mL Pack   potassium chloride SA 20 MEQ tablet Commonly known as: KLOR-CON   sodium chloride 0.65 % Soln nasal spray Commonly known as: OCEAN   sucralfate 1 g tablet Commonly known as: CARAFATE     TAKE these medications   acetaminophen 325 MG tablet Commonly known as: TYLENOL Place 2 tablets (650 mg total) into feeding tube every 6 (six) hours as needed for mild pain, fever or headache. What changed:   medication strength  how much to take  how to take this  when to take this  reasons to take this   albuterol (2.5 MG/3ML) 0.083% nebulizer solution Commonly known as: PROVENTIL Take 3 mLs (2.5 mg total) by nebulization every 2 (two) hours as needed for wheezing or shortness of breath. Replaces: albuterol 108 (90 Base) MCG/ACT inhaler   arformoterol 15 MCG/2ML Nebu Commonly known as: BROVANA Take 2 mLs (15 mcg total) by nebulization 2 (two) times daily.   Breo Ellipta 100-25 MCG/INH Aepb Generic drug: fluticasone furoate-vilanterol Inhale 1 puff into the lungs daily.   budesonide 0.25 MG/2ML nebulizer solution Commonly known as: PULMICORT Take 2 mLs (0.25 mg total) by nebulization 2 (two) times daily.    buprenorphine 20 MCG/HR Ptwk Commonly known as: BUTRANS Place 1 patch onto the skin once a week.   ceFAZolin 2-4 GM/100ML-% IVPB Commonly known as: ANCEF Inject 100 mLs (2 g total) into the vein every 8 (eight) hours for 16 doses.   clonazePAM 0.5 MG tablet Commonly known as: KLONOPIN Place 1 tablet (0.5 mg total) into feeding tube 2 (two) times daily. Replaces: ALPRAZolam 0.5 MG tablet   cyanocobalamin 1000 MCG tablet Place 1 tablet (1,000 mcg total) into feeding tube every other day. Start taking on: October 05, 2019 Replaces: B-12 1000 MCG Caps   docusate 50 MG/5ML liquid Commonly known as: COLACE Place 10 mLs (100 mg total) into feeding tube 2 (two) times daily. Replaces: docusate sodium 100 MG capsule   enoxaparin 40 MG/0.4ML injection Commonly known as: LOVENOX Inject 0.4 mLs (40 mg total) into the skin daily. Start taking on: October 05, 2019   feeding supplement (VITAL 1.5 CAL) Liqd Place 1,000 mLs into feeding tube continuous. 60 ml/hour What changed:   how much to take  how to take this  when to take this  additional instructions   free water Soln Place 100 mLs into feeding tube every 8 (eight) hours.   guaiFENesin 100 MG/5ML Soln Commonly known as: ROBITUSSIN Place 15 mLs (300 mg total) into feeding tube every 6 (six) hours. Replaces: guaiFENesin 600 MG 12 hr tablet   insulin aspart 100 UNIT/ML injection Commonly known as: novoLOG Inject 0-15 Units into the skin every 4 (four) hours.   Oxycodone HCl 10 MG Tabs Place 0.5 tablets (5 mg total) into feeding tube every 6 (six) hours as needed for severe pain.   pantoprazole sodium 40 mg/20 mL Pack Commonly known as: PROTONIX Place 20 mLs (40 mg total) into feeding tube daily. Start taking on: October 05, 2019 Replaces: pantoprazole 40 MG tablet   polyethylene glycol 17 g packet Commonly known as: MIRALAX / GLYCOLAX Place 17 g into feeding tube daily. Start taking on: October 05, 2019 What changed:   how  to take this  when to take this  reasons to take this   QUEtiapine 25 MG  tablet Commonly known as: SEROQUEL Place 1 tablet (25 mg total) into feeding tube 2 (two) times daily.   revefenacin 175 MCG/3ML nebulizer solution Commonly known as: YUPELRI Take 3 mLs (175 mcg total) by nebulization daily. Start taking on: October 05, 2019   rosuvastatin 20 MG tablet Commonly known as: CRESTOR Place 1 tablet (20 mg total) into feeding tube daily. Start taking on: October 05, 2019 What changed:   medication strength  how to take this   ticagrelor 90 MG Tabs tablet Commonly known as: BRILINTA Place 1 tablet (90 mg total) into feeding tube 2 (two) times daily. What changed: how to take this   umeclidinium bromide 62.5 MCG/INH Aepb Commonly known as: INCRUSE ELLIPTA Inhale 1 puff into the lungs daily.   Vitamin D (Ergocalciferol) 1.25 MG (50000 UNIT) Caps capsule Commonly known as: DRISDOL Place 1 capsule (50,000 Units total) into feeding tube every 7 (seven) days. Start taking on: October 11, 2019 What changed: how to take this            Discharge Care Instructions  (From admission, onward)         Start     Ordered   10/04/19 0000  Discharge wound care:       Comments: Management per [facility].   10/04/19 1357            Diet and Activity recommendation: See Discharge Instructions above  Major procedures and Radiology Reports - PLEASE review detailed and final reports for all details, in brief -     DG Chest 1 View  Result Date: 09/25/2019 CLINICAL DATA:  Respiratory failure EXAM: CHEST  1 VIEW COMPARISON:  09/23/2019, 09/19/2019 FINDINGS: Tracheostomy is in place. Pulmonary insufflation is stable since prior examination. Right suprahilar mass, compatible with post radiation paramediastinal fibrosis, and associated mild right-sided volume loss is again noted. There is progressive airspace infiltrate within the mid and upper lung zone of the left lung when  compared to prior examination. No pneumothorax or pleural effusion. Cardiac size within normal limits. Nasoenteric feeding tube extends into the upper abdomen beyond the margin of the examination. IMPRESSION: Progressive left lung infiltrate, in keeping with acute pneumonia. Electronically Signed   By: Fidela Salisbury MD   On: 09/25/2019 23:09   DG CHEST PORT 1 VIEW  Result Date: 09/30/2019 CLINICAL DATA:  Respiratory distress. EXAM: PORTABLE CHEST 1 VIEW COMPARISON:  09/27/2019 and 09/25/2019 and CT 08/11/2019 FINDINGS: Enteric tube courses into the stomach and off the film as tip is not visualized. Tracheostomy tube in adequate position. Lungs are adequately inflated with stable subtle hazy perihilar/suprahilar prominence. Stable increased soft tissue density/postsurgical change over the right paramediastinal region. Cardiomediastinal silhouette and remainder the exam is unchanged. IMPRESSION: 1. Stable subtle hazy interstitial prominence over the perihilar/infrahilar regions which may be due to mild vascular congestion or underlying infection. Stable prominent soft tissue density/postsurgical change over the right paramediastinal region. 2.  Tubes and lines as described. Electronically Signed   By: Marin Olp M.D.   On: 09/30/2019 11:29   DG CHEST PORT 1 VIEW  Result Date: 09/27/2019 CLINICAL DATA:  History of cardiomyopathy and respiratory difficulty EXAM: PORTABLE CHEST 1 VIEW COMPARISON:  09/25/2018 FINDINGS: Cardiac shadow is stable. Tracheostomy tube and feeding catheter are again noted and stable. Lungs are well aerated bilaterally with significant improved aeration on the left. Some residual opacity is noted in the left mid lung. IMPRESSION: Near complete resolution of previously seen infiltrate. Electronically Signed   By:  Inez Catalina M.D.   On: 09/27/2019 14:46   DG Chest Port 1 View  Result Date: 09/23/2019 CLINICAL DATA:  64 year old female with respiratory failure EXAM: PORTABLE CHEST 1  VIEW COMPARISON:  09/23/2019 FINDINGS: Cardiomediastinal silhouette is unchanged in size and contour. Unchanged tracheostomy tube. Unchanged enteric feeding tube which terminates out of the field of view. Similar appearance of architectural distortion in the right hilum in the right mid lung with tenting at the right hemidiaphragm. Similar appearance of mixed interstitial and airspace opacities in the left lung. No large pleural effusion or pneumothorax. IMPRESSION: Similar appearance of the chest x-ray, with persisting mixed interstitial and airspace disease of the left lung. Unchanged appearance of architectural distortion in the right hilum with tenting of the right hemidiaphragm. Unchanged tracheostomy tube and enteric feeding tube. Electronically Signed   By: Corrie Mckusick D.O.   On: 09/23/2019 09:02   DG CHEST PORT 1 VIEW  Result Date: 09/23/2019 CLINICAL DATA:  64 year old female with respiratory distress. EXAM: PORTABLE CHEST 1 VIEW COMPARISON:  Chest radiograph dated 09/22/2019. FINDINGS: Tracheostomy above the carina. Feeding tube extends below the diaphragm with tip beyond the inferior margin of the image. Increased left lung interstitial and hazy airspace densities compared to the earlier radiograph which may represent developing infiltrate or aspiration. Clinical correlation is recommended. Radiation changes of the right hilar region with associated scarring. No lobar consolidation, large pleural effusion, or pneumothorax. The cardiac silhouette is within limits. Coronary vascular calcification. Atherosclerotic calcification of the aorta. No acute osseous pathology. IMPRESSION: Slight interval increase in the left upper lobe pulmonary densities may represent developing infiltrate or aspiration. Clinical correlation is recommended. Electronically Signed   By: Anner Crete M.D.   On: 09/23/2019 02:14   DG CHEST PORT 1 VIEW  Result Date: 09/22/2019 CLINICAL DATA:  Shortness of breath EXAM:  PORTABLE CHEST 1 VIEW COMPARISON:  Chest radiograph dated 09/19/2019. CT chest dated 08/11/2019. FINDINGS: Masslike opacity in the medial right upper hemithorax, likely reflecting radiation changes when correlating with prior CT. Volume loss with tenting of the right hemidiaphragm. Left lung is essentially clear, noting minimal basilar atelectasis. No pleural effusion or pneumothorax. Tracheostomy in satisfactory position. Enteric tube courses into the mid stomach. The heart is normal in size.  Thoracic aortic atherosclerosis. IMPRESSION: Radiation changes with volume loss in the right hemithorax. No evidence of acute cardiopulmonary disease. Electronically Signed   By: Julian Hy M.D.   On: 09/22/2019 09:04   DG Chest Port 1 View  Result Date: 09/19/2019 CLINICAL DATA:  Respiratory failure EXAM: PORTABLE CHEST 1 VIEW COMPARISON:  September 16, 2019 FINDINGS: Tracheostomy catheter tip is 5.5 cm above the carina. Feeding tube tip is below the diaphragm. No pneumothorax. Fibrotic change in the right perihilar region is likely due to prior radiation therapy change. There is slight increased opacity in the left mid lung, likely due to atelectatic change. There is mild scarring in the right base. No airspace opacity evident. Heart size is normal. The pulmonary vascularity on the left is normal. There is retraction of pulmonary vascularity on the right due to the radiation therapy change. There is an apparent coronary stent. No adenopathy is appreciable by radiography. There is aortic atherosclerosis. IMPRESSION: Post radiation therapy change right perihilar region. Scarring right base, stable. Probable atelectatic change left mid lung. No new opacity evident. Stable cardiac silhouette. Tube positions as described without pneumothorax. Aortic Atherosclerosis (ICD10-I70.0). Electronically Signed   By: Lowella Grip III M.D.   On: 09/19/2019  07:52   DG Chest Port 1 View  Result Date: 09/16/2019 CLINICAL DATA:   Acute respiratory failure.  Hypoxemia. EXAM: PORTABLE CHEST 1 VIEW COMPARISON:  September 14, 2019 FINDINGS: No pneumothorax. The tracheostomy tube is stable. The feeding tube terminates below today's film. Mild hazy opacity in the left lung is similar in the interval, likely pneumonia. No change in the cardiomediastinal silhouette. The study is otherwise stable. IMPRESSION: 1. Support apparatus as above. 2. Mild hazy opacity on the left, particularly in the mid lung, is similar in the interval and worrisome for pneumonia. Recommend follow-up to resolution. Electronically Signed   By: Dorise Bullion III M.D   On: 09/16/2019 13:14   DG CHEST PORT 1 VIEW  Result Date: 09/14/2019 CLINICAL DATA:  Pneumonia, past history coronary artery disease post STEMI, ischemic cardiomyopathy, hypertension, lung cancer, colon cancer, smoker EXAM: PORTABLE CHEST 1 VIEW COMPARISON:  Portable exam 1157 hours compared to 09/12/2019 FINDINGS: Tracheostomy tube with tip projecting over tracheal air column. Feeding tube extending into stomach. Normal heart size and pulmonary vascularity. Atherosclerotic calcification aorta. Volume loss in the RIGHT upper lobe with superior elevation of RIGHT diaphragm, abnormal RIGHT hilar density, and streaky scarring at RIGHT infrahilar region likely reflecting changes related to prior lung cancer treatment. LEFT mid lung/perihilar infiltrate difficult with pneumonia. Minimal LEFT base atelectasis. No pleural effusion or pneumothorax. IMPRESSION: LEFT lung infiltrate consistent with pneumonia, little changed. Scarring and volume loss in RIGHT lung likely reflecting posttherapy changes related to lung cancer. Electronically Signed   By: Lavonia Dana M.D.   On: 09/14/2019 13:55   DG Chest Port 1 View  Result Date: 09/12/2019 CLINICAL DATA:  Tracheostomy, right upper lobe lung cancer status post right upper lobectomy and radiation therapy EXAM: PORTABLE CHEST 1 VIEW COMPARISON:  Chest radiograph from one day  prior. FINDINGS: Tracheostomy tube tip terminates over the tracheal air column at the level of the thoracic inlet. Stable cardiomediastinal silhouette with normal heart size. No pneumothorax. No pleural effusion. New patchy opacity at the left lung base obscuring the left hemidiaphragm. Stable volume loss in the right hemithorax. Stable right upper parahilar ill-defined opacity with associated parenchymal distortion. IMPRESSION: 1. Tracheostomy in place. 2. New patchy left lung base opacity, which could represent atelectasis, aspiration and/or pneumonia. 3. Stable post surgical and post treatment changes in the right hemithorax. Electronically Signed   By: Ilona Sorrel M.D.   On: 09/12/2019 09:36   DG CHEST PORT 1 VIEW  Result Date: 09/11/2019 CLINICAL DATA:  64 year old female with history of respiratory failure. EXAM: PORTABLE CHEST 1 VIEW COMPARISON:  Chest x-ray 09/08/2019. FINDINGS: An endotracheal tube is in place with tip 3.7 cm above the carina. A nasogastric tube is seen extending into the stomach, however, the tip of the nasogastric tube extends below the lower margin of the image. Chronic postradiation architectural distortion in the right hilar and suprahilar region, similar to prior examinations. This is again associated with elevation of the right hemidiaphragm. Patchy areas of interstitial prominence an ill-defined airspace disease noted throughout the left lung, most severe in the left upper lobe. No pleural effusions. No pneumothorax. Heart size is normal. Aortic atherosclerosis. IMPRESSION: 1. Support apparatus, as above. 2. Patchy multifocal interstitial and airspace disease throughout the left lung concerning for multilobar bronchopneumonia. 3. Aortic atherosclerosis. 4. Chronic postradiation changes in the right hemithorax redemonstrated, as above. Electronically Signed   By: Vinnie Langton M.D.   On: 09/11/2019 07:13   DG Chest [facility]  Result Date: 09/08/2019 CLINICAL DATA:   Respiratory failure. EXAM: PORTABLE CHEST 1 VIEW COMPARISON:  09/07/2019 FINDINGS: The endotracheal tube is 2.3 cm above the carina. The NG tube is coursing down the esophagus and into the stomach. Stable radiation changes involving the right hilar area with significant scarring changes and loss of volume in the right lung. No acute overlying pulmonary findings. IMPRESSION: 1. Endotracheal tube and NG tubes in good position. 2. Stable radiation changes in the right lung. 3. No acute overlying pulmonary process. Electronically Signed   By: Marijo Sanes M.D.   On: 09/08/2019 06:32   DG Chest Port 1 View  Result Date: 09/07/2019 CLINICAL DATA:  Respiratory failure. EXAM: PORTABLE CHEST 1 VIEW COMPARISON:  09/05/2019.  CT chest 08/11/2019. FINDINGS: Endotracheal tube and NG tube in stable position. Stable cardiomegaly. Post radiation changes again noted over the right upper lung. Improved aeration in the left mid lung and right base with interval near complete resolution of infiltrates. No prominent pleural effusion. No pneumothorax. Stable elevation right hemidiaphragm. IMPRESSION: 1.  Endotracheal tube and NG tube in stable position. 2.  Post radiation changes again noted over the right upper lung. 3. Improved aeration in the left mid lung and right base with interval near complete resolution of previously identified infiltrates. Electronically Signed   By: Marcello Moores  Register   On: 09/07/2019 06:57   DG Chest Portable 1 View  Result Date: 09/05/2019 CLINICAL DATA:  Shortness of breath EXAM: PORTABLE CHEST 1 VIEW COMPARISON:  08/19/2019 FINDINGS: Cardiac shadow is within normal limits. Aortic calcifications are seen. The lungs again demonstrate diffuse right perihilar density consistent with prior radiation therapy and retraction. Increasing density is noted in the left suprahilar and paramediastinal region new from the prior exam consistent with acute infiltrate. New patchy opacity in the right base with a small  effusion is noted as well consistent with acute infiltrate. Endotracheal tube and gastric catheter are noted in satisfactory position. IMPRESSION: Increasing patchy opacities in the right base and left medial upper lobe consistent with acute infiltrate. Post radiation changes on right are seen. Electronically Signed   By: Inez Catalina M.D.   On: 09/05/2019 08:16    Micro Results   Recent Results (from the past 240 hour(s))  Culture, respiratory (non-expectorated)     Status: None   Collection Time: 09/26/19  9:46 AM   Specimen: Tracheal Aspirate; Respiratory  Result Value Ref Range Status   Specimen Description TRACHEAL ASPIRATE  Final   Special Requests Normal  Final   Gram Stain   Final    RARE WBC PRESENT, PREDOMINANTLY PMN FEW GRAM NEGATIVE RODS FEW GRAM POSITIVE COCCI IN PAIRS Performed at [facility], [place] [address], [place], [place] [address]    Culture FEW ESCHERICHIA COLI  Final   Report Status 09/30/2019 FINAL  Final   Organism ID, Bacteria ESCHERICHIA COLI  Final      Susceptibility   Escherichia coli - MIC*    AMPICILLIN <=2 SENSITIVE Sensitive     CEFAZOLIN <=4 SENSITIVE Sensitive     CEFEPIME <=0.12 SENSITIVE Sensitive     CEFTAZIDIME <=1 SENSITIVE Sensitive     CEFTRIAXONE <=0.25 SENSITIVE Sensitive     CIPROFLOXACIN 1 SENSITIVE Sensitive     GENTAMICIN <=1 SENSITIVE Sensitive     IMIPENEM <=0.25 SENSITIVE Sensitive     TRIMETH/SULFA <=20 SENSITIVE Sensitive     AMPICILLIN/SULBACTAM <=2 SENSITIVE Sensitive     PIP/TAZO <=4 SENSITIVE Sensitive     *  FEW ESCHERICHIA COLI       Today   Subjective:   Chelsea Allison today has no headache,no chest or abdominal pain, could not tolerate weaning yesterday.  Objective:   Blood pressure (!) 98/54, pulse 88, temperature 98.2 F (36.8 C), temperature source Oral, resp. rate (!) 24, height 5\' 9"  (1.753 m), weight 82.7 kg, SpO2 96 %.   Intake/Output Summary (Last 24 hours) at 10/04/2019 1358 Last data filed at  10/04/2019 1100 Gross per 24 hour  Intake 1539.86 ml  Output 550 ml  Net 989.86 ml    Exam  Awake Alert, Oriented X 3, No new F.N deficits, Normal affect Tracheostomy in place, no discharge or oozing Symmetrical Chest wall movement, Good air movement bilaterally, mechanically vented respiratory sounds RRR,No Gallops,Rubs or new Murmurs, No Parasternal Heave +ve B.Sounds, Abd Soft, No tenderness, No rebound - guarding or rigidity. No Cyanosis, Clubbing or edema, No new Rash or bruise    Data Review   CBC w Diff:  Lab Results  Component Value Date   WBC 7.8 10/04/2019   HGB 8.1 (L) 10/04/2019   HCT 27.9 (L) 10/04/2019   PLT 281 10/04/2019   LYMPHOPCT 10 09/26/2019   MONOPCT 10 09/26/2019   EOSPCT 0 09/26/2019   BASOPCT 0 09/26/2019    CMP:  Lab Results  Component Value Date   NA 137 10/04/2019   K 3.8 10/04/2019   CL 100 10/04/2019   CO2 30 10/04/2019   BUN 28 (H) 10/04/2019   CREATININE 0.69 10/04/2019   PROT 5.9 (L) 09/30/2019   ALBUMIN 2.5 (L) 09/30/2019   BILITOT 0.2 (L) 09/30/2019   ALKPHOS 67 09/30/2019   AST 17 09/30/2019   ALT 11 09/30/2019  .   Total Time in preparing paper work, data evaluation and todays exam - 15 minutes  Phillips Climes M.D on 10/04/2019 at 1:58 PM  [facility]   Office  [phone]

## 2019-10-04 NOTE — TOC Transition Note (Signed)
Transition of Care [facility]) - CM/SW Discharge Note   Patient Details  Name: Chelsea Allison MRN: [id number] Date of Birth: [birth year]  [facility] CM/SW Contact:  Ella Bodo, RN Phone Number: 10/04/2019, 2:19 PM   Clinical Narrative: Notified by Danae Chen with [facility] that patient has received insurance authorization for admission to [facility] hospital.  Attending MD notified; he states patient is medically stable and can discharge today to [facility].  Family has been notified by facility of authorization received, and is obtaining consent.  Patient will admit to room 5E09; Dr. Laren Everts is admitting physician.  Bedside nurse will need to call [phone] to give report to receiving nurse.  Bed available after 5 PM per admissions liaison.    Final next level of care: [place] (LTAC)     Patient Goals and CMS Choice Patient states their goals for this hospitalization and ongoing recovery are:: agreeable to LTAC for 2 weeks then wants to go home CMS Medicare.gov Compare Post Acute Care list provided to:: Patient Choice offered to / list presented to : Patient  Discharge Placement  [facility]                     Discharge Plan and Services In-house Referral: Clinical Social Work Discharge Planning Services: CM Consult ([facility]) Post Acute Care Choice: Long Term Acute Care (LTAC)                               Social Determinants of Health (SDOH) Interventions     Readmission Risk Interventions Readmission Risk Prevention Plan 01/11/2019 12/20/2018 12/20/2018  Transportation Screening Complete - Complete  PCP or Specialist Appt within 5-7 Days - Complete -  PCP or Specialist Appt within 3-5 Days (No Data) - -  Home Care Screening - - Complete  Medication Review (RN CM) - - Complete  HRI or Home Care Consult Complete - -  Social Work Consult for [place] Planning/Counseling Complete - -  Palliative  Care Screening Not Applicable - -  Medication Review (RN Care Manager) Complete - -  Some recent data might be hidden   Reinaldo Raddle, RN, BSN  Trauma/Neuro ICU Case Manager [phone]

## 2019-10-04 NOTE — Discharge Instructions (Signed)
Management per [facility].

## 2019-10-05 LAB — COMPREHENSIVE METABOLIC PANEL
ALT: 10 U/L (ref 0–44)
AST: 15 U/L (ref 15–41)
Albumin: 2.4 g/dL — ABNORMAL LOW (ref 3.5–5.0)
Alkaline Phosphatase: 76 U/L (ref 38–126)
Anion gap: 10 (ref 5–15)
BUN: 27 mg/dL — ABNORMAL HIGH (ref 8–23)
CO2: 26 mmol/L (ref 22–32)
Calcium: 9.5 mg/dL (ref 8.9–10.3)
Chloride: 102 mmol/L (ref 98–111)
Creatinine, Ser: 0.61 mg/dL (ref 0.44–1.00)
GFR calc Af Amer: >60 mL/min (ref >60)
GFR calc non Af Amer: >60 mL/min (ref >60)
Glucose, Bld: 101 mg/dL — ABNORMAL HIGH (ref 70–99)
Potassium: 3.5 mmol/L (ref 3.5–5.1)
Sodium: 138 mmol/L (ref 135–145)
Total Bilirubin: 0.2 mg/dL — ABNORMAL LOW (ref 0.3–1.2)
Total Protein: 5.4 g/dL — ABNORMAL LOW (ref 6.5–8.1)

## 2019-10-05 LAB — BLOOD GAS, ARTERIAL
Acid-Base Excess: 4.7 mmol/L — ABNORMAL HIGH (ref 0.0–2.0)
Bicarbonate: 27.4 mmol/L (ref 20.0–28.0)
FIO2: 50
O2 Saturation: 100 %
Patient temperature: 37
pCO2 arterial: 32.3 mmHg (ref 32.0–48.0)
pH, Arterial: 7.539 — ABNORMAL HIGH (ref 7.350–7.450)
pO2, Arterial: 171 mmHg — ABNORMAL HIGH (ref 83.0–108.0)

## 2019-10-05 LAB — CBC
HCT: 27.5 % — ABNORMAL LOW (ref 36.0–46.0)
Hemoglobin: 7.9 g/dL — ABNORMAL LOW (ref 12.0–15.0)
MCH: 27.1 pg (ref 26.0–34.0)
MCHC: 28.7 g/dL — ABNORMAL LOW (ref 30.0–36.0)
MCV: 94.2 fL (ref 80.0–100.0)
Platelets: 280 10*3/uL (ref 150–400)
RBC: 2.92 MIL/uL — ABNORMAL LOW (ref 3.87–5.11)
RDW: 19.5 % — ABNORMAL HIGH (ref 11.5–15.5)
WBC: 7.9 10*3/uL (ref 4.0–10.5)
nRBC: 0 % (ref 0.0–0.2)

## 2019-10-07 DIAGNOSIS — I5022 Chronic systolic (congestive) heart failure: Secondary | ICD-10-CM

## 2019-10-07 NOTE — Consult Note (Signed)
[facility] [place]  PULMONARY SERVICE  Date of Service: 10/07/2019  PULMONARY CRITICAL CARE CONSULT   Jimena Wieczorek  YNW:[id number]  DOB: 27-Dec-1955   DOA: 10/04/2019  Referring Physician: Merton Border, MD  HPI: Chelsea Allison is a 64 y.o. female seen for follow up of Acute on Chronic Respiratory Failure.  Patient has multiple medical problems including coronary artery disease history of STEMI PCI COPD hypertension hyperlipidemia CHF non-small cell cancer of the lung stage III status post partial resection presented to the hospital with increasing shortness of breath.  Patient called EMS in the ED patient was found to be unresponsive with saturations of 50%.  Patient was initially placed on CPAP and BiPAP.  Subsequently had a decline and ended up intubated on the ventilator.  Patient was noted to have a drop in hemoglobin needed transfusions.  Subsequently after intubation patient was not able to wean off the ventilator was by July 10 placed on T collar trials after a tracheostomy had been done.  FiO2 still was requiring to be around 70%.  Because of failure to wean patient was transferred to our facility for further management  Review of Systems:  ROS performed and is unremarkable other than noted above.  Past Medical History:  Diagnosis Date  . CAD (coronary artery disease) 10/20 DES to pRCA, DES mCx   . Cancer of lung ([place])    Twice  . Colon cancer ([place])   . COPD (chronic obstructive pulmonary disease) ([place])   . Hyperlipidemia   . Hypertension   . Ischemic cardiomyopathy   . Neuropathy    Secondary to chemo  . STEMI (ST elevation myocardial infarction) ([place]) 12/12/2018  . Tobacco abuse     Past Surgical History:  Procedure Laterality Date  . ABDOMINAL SURGERY     For colon cancer  . COLOSTOMY    . CORONARY STENT INTERVENTION N/A 12/20/2018   Procedure: CORONARY STENT INTERVENTION - Right;  Surgeon: Burnell Blanks, MD;   Location: [place] [facility];  Service: Cardiovascular;  Laterality: N/A;  . CORONARY/GRAFT ACUTE MI REVASCULARIZATION N/A 12/12/2018   Procedure: Coronary/Graft Acute MI Revascularization;  Surgeon: Belva Crome, MD;  Location: [place] [facility];  Service: Cardiovascular;  Laterality: N/A;  . LEFT HEART CATH AND CORONARY ANGIOGRAPHY N/A 12/12/2018   Procedure: LEFT HEART CATH AND CORONARY ANGIOGRAPHY;  Surgeon: Belva Crome, MD;  Location: [place] [facility];  Service: Cardiovascular;  Laterality: N/A;  . LEFT HEART CATH AND CORONARY ANGIOGRAPHY N/A 08/11/2019   Procedure: LEFT HEART CATH AND CORONARY ANGIOGRAPHY;  Surgeon: Burnell Blanks, MD;  Location: [place] [facility];  Service: Cardiovascular;  Laterality: N/A;    Social History:    reports that she has been smoking cigarettes. She has never used smokeless tobacco. She reports that she does not drink alcohol and does not use drugs.  Family History: Non-Contributory to the present illness  Allergies  Allergen Reactions  . Statins Other (See Comments)  . Hydrochlorothiazide Other (See Comments)    headaches  . Hydrocodone Nausea And Vomiting  . Lisinopril Other (See Comments)    Headache   . Oxycodone Nausea Only    Medications: Reviewed on Rounds  Physical Exam:  Vitals: Temperature is 96.5 pulse 76 respiratory rate 20 blood pressure is 105/56 saturations 98%  Ventilator Settings on assist control FiO2 is 35% tidal volume 500 PEEP 5  . General: Comfortable at this time . Eyes: Grossly normal lids, irises &  conjunctiva . ENT: grossly tongue is normal . Neck: no obvious mass . Cardiovascular: S1-S2 normal no gallop or rub . Respiratory: No rhonchi no rales are noted at this time . Abdomen: Soft and nontender . Skin: no rash seen on limited exam . Musculoskeletal: not rigid . Psychiatric:unable to assess . Neurologic: no seizure no involuntary movements         Labs on Admission:  Basic Metabolic  Panel: Recent Labs  Lab 10/01/19 0603 10/02/19 1043 10/02/19 1332 10/03/19 0321 10/04/19 0144 10/05/19 0511  NA 136 131*  --  137 137 138  K 4.0 4.6  --  4.0 3.8 3.5  CL 101 95*  --  102 100 102  CO2 28 26  --  25 30 26   GLUCOSE 132* 276*  --  126* 111* 101*  BUN 32* 27*  --  30* 28* 27*  CREATININE 0.62 0.79  --  0.62 0.69 0.61  CALCIUM 9.5 9.5  --  9.2 9.5 9.5  MG  --   --  2.4  --   --   --   PHOS  --   --  5.4*  --   --   --     Recent Labs  Lab 10/05/19 0038  PHART 7.539*  PCO2ART 32.3  PO2ART 171*  HCO3 27.4  O2SAT 100.0    Liver Function Tests: Recent Labs  Lab 10/05/19 0511  AST 15  ALT 10  ALKPHOS 76  BILITOT 0.2*  PROT 5.4*  ALBUMIN 2.4*   No results for input(s): LIPASE, AMYLASE in the last 168 hours. No results for input(s): AMMONIA in the last 168 hours.  CBC: Recent Labs  Lab 10/01/19 0310 10/02/19 1043 10/03/19 0321 10/04/19 0144 10/05/19 0511  WBC 8.1 17.4* 9.9 7.8 7.9  HGB 8.2* 10.2* 8.1* 8.1* 7.9*  HCT 27.8* 36.4 28.3* 27.9* 27.5*  MCV 94.9 96.0 95.6 95.9 94.2  PLT 288 459* 271 281 280    Cardiac Enzymes: No results for input(s): CKTOTAL, CKMB, CKMBINDEX, TROPONINI in the last 168 hours.  BNP (last 3 results) Recent Labs    08/10/19 2150 08/13/19 0225 09/05/19 0735  BNP 318.9* 929.4* 2,477.5*    ProBNP (last 3 results) No results for input(s): PROBNP in the last 8760 hours.   Radiological Exams on Admission: DG Chest Port 1 View  Result Date: 10/04/2019 CLINICAL DATA:  Respiratory failure EXAM: PORTABLE CHEST 1 VIEW COMPARISON:  09/30/2019 FINDINGS: Tracheostomy tube and feeding catheter are noted in satisfactory position. Chronic right perihilar density is noted overall stable appearance from the prior exam as well as multiple previous studies dating back to at least April of 2021 consistent with prior treatment. Suture lines are noted in this region as well. The left lung is well aerated. Mild volume loss on the right  is seen. No bony abnormality is noted. IMPRESSION: Chronic changes in the right perihilar region related to prior surgery and treatment. No acute abnormality is noted. Electronically Signed   By: Inez Catalina M.D.   On: 10/04/2019 23:18   DG Abd Portable 1V  Result Date: 10/04/2019 CLINICAL DATA:  Check gastric catheter placement EXAM: PORTABLE ABDOMEN - 1 VIEW COMPARISON:  08/14/2019 FINDINGS: Feeding catheter is been placed into the stomach and lies in the midportion of the gastric body. IMPRESSION: Feeding catheter within the midportion of the stomach. Electronically Signed   By: Inez Catalina M.D.   On: 10/04/2019 23:17    Assessment/Plan Active Problems:   Acute on  chronic respiratory failure with hypoxia (HCC)   Coronary artery disease involving native coronary artery of native heart without angina pectoris   COPD, severe (HCC)   Chronic HFrEF (heart failure with reduced ejection fraction) ([place])   1. Acute on chronic respiratory failure hypoxia patient is on full support on the ventilator.  Spontaneous breathing trials have been poor we will continue with the secretion management supportive care. 2. Severe COPD patient has history of severe COPD we will continue with medical management nebulizers as deemed necessary. 3. Coronary artery disease multiple history of MI left with residual EF of 35 to 40% this was done on August 12, 2019 4. Chronic heart failure reduced ejection fraction needs close monitoring of fluid status.  Right now appears to be compensated.  I have personally seen and evaluated the patient, evaluated laboratory and imaging results, formulated the assessment and plan and placed orders. The Patient requires high complexity decision making with multiple systems involvement.  Case was discussed on Rounds with the Respiratory Therapy Director and the Respiratory staff Time Spent 10minutes  Alynah Schone A Sofia Vanmeter, MD [facility] Pulmonary Critical Care Medicine Sleep Medicine

## 2019-10-08 ENCOUNTER — Encounter: Payer: Self-pay | Admitting: Internal Medicine

## 2019-10-08 DIAGNOSIS — J449 Chronic obstructive pulmonary disease, unspecified: Secondary | ICD-10-CM

## 2019-10-08 DIAGNOSIS — I2510 Atherosclerotic heart disease of native coronary artery without angina pectoris: Secondary | ICD-10-CM | POA: Diagnosis present

## 2019-10-08 DIAGNOSIS — J9621 Acute and chronic respiratory failure with hypoxia: Secondary | ICD-10-CM

## 2019-10-08 DIAGNOSIS — I509 Heart failure, unspecified: Secondary | ICD-10-CM

## 2019-10-08 NOTE — Progress Notes (Signed)
Pulmonary Critical Care Medicine [place]   PULMONARY CRITICAL CARE SERVICE  PROGRESS NOTE  Date of Service: 10/08/2019  Chelsea Allison  NAT:[id number]  DOB: [birth year]   DOA: 10/04/2019  Referring Physician: Merton Border, MD  HPI: Chelsea Allison is a 64 y.o. female seen for follow up of Acute on Chronic Respiratory Failure.  Patient this morning is full support did about 6 hours of pressure support weaning yesterday.  She will let the respiratory therapist know when she is ready to start weaning  Medications: Reviewed on Rounds  Physical Exam:  Vitals: Temperature 96.8 pulse 60 respiratory 28 blood pressure is 104/58 saturations 60%  Ventilator Settings on assist control FiO2 40% tidal volume 500 PEEP 5  . General: Comfortable at this time . Eyes: Grossly normal lids, irises & conjunctiva . ENT: grossly tongue is normal . Neck: no obvious mass . Cardiovascular: S1 S2 normal no gallop . Respiratory: Coarse breath sounds with few rhonchi . Abdomen: soft . Skin: no rash seen on limited exam . Musculoskeletal: not rigid . Psychiatric:unable to assess . Neurologic: no seizure no involuntary movements         Lab Data:   Basic Metabolic Panel: Recent Labs  Lab 10/02/19 1043 10/02/19 1332 10/03/19 0321 10/04/19 0144 10/05/19 0511  NA 131*  --  137 137 138  K 4.6  --  4.0 3.8 3.5  CL 95*  --  102 100 102  CO2 26  --  25 30 26   GLUCOSE 276*  --  126* 111* 101*  BUN 27*  --  30* 28* 27*  CREATININE 0.79  --  0.62 0.69 0.61  CALCIUM 9.5  --  9.2 9.5 9.5  MG  --  2.4  --   --   --   PHOS  --  5.4*  --   --   --     ABG: Recent Labs  Lab 10/05/19 0038  PHART 7.539*  PCO2ART 32.3  PO2ART 171*  HCO3 27.4  O2SAT 100.0    Liver Function Tests: Recent Labs  Lab 10/05/19 0511  AST 15  ALT 10  ALKPHOS 76  BILITOT 0.2*  PROT 5.4*  ALBUMIN 2.4*   No results for input(s): LIPASE, AMYLASE in the last 168 hours. No results for input(s): AMMONIA  in the last 168 hours.  CBC: Recent Labs  Lab 10/02/19 1043 10/03/19 0321 10/04/19 0144 10/05/19 0511  WBC 17.4* 9.9 7.8 7.9  HGB 10.2* 8.1* 8.1* 7.9*  HCT 36.4 28.3* 27.9* 27.5*  MCV 96.0 95.6 95.9 94.2  PLT 459* 271 281 280    Cardiac Enzymes: No results for input(s): CKTOTAL, CKMB, CKMBINDEX, TROPONINI in the last 168 hours.  BNP (last 3 results) Recent Labs    08/10/19 2150 08/13/19 0225 09/05/19 0735  BNP 318.9* 929.4* 2,477.5*    ProBNP (last 3 results) No results for input(s): PROBNP in the last 8760 hours.  Radiological Exams: No results found.  Assessment/Plan Active Problems:   Acute on chronic respiratory failure with hypoxia (HCC)   COPD, severe (HCC)   Coronary artery disease involving native coronary artery of native heart without angina pectoris   Chronic congestive heart failure ([place])   1. Acute on chronic respiratory failure hypoxia plan is going to be to wean on pressure support according to the protocol did 6 hours yesterday we should be able to advance further today 2. Severe COPD we will continue with supportive care nebulized medications as tolerated. 3. Coronary artery  disease by history patient has history of ischemic cardiomyopathy also we will continue to monitor closely. 4. Acute renal failure monitoring labs patient's last creatinine within the normal range 5. Congestive heart failure right now is compensated we will continue to monitor along.   I have personally seen and evaluated the patient, evaluated laboratory and imaging results, formulated the assessment and plan and placed orders. The Patient requires high complexity decision making with multiple systems involvement.  Rounds were done with the Respiratory Therapy Director and Staff therapists and discussed with nursing staff also.  Allyne Gee, MD [facility] Pulmonary Critical Care Medicine Sleep Medicine

## 2019-10-09 ENCOUNTER — Encounter: Payer: Self-pay | Admitting: Internal Medicine

## 2019-10-09 DIAGNOSIS — I2510 Atherosclerotic heart disease of native coronary artery without angina pectoris: Secondary | ICD-10-CM | POA: Insufficient documentation

## 2019-10-09 DIAGNOSIS — J449 Chronic obstructive pulmonary disease, unspecified: Secondary | ICD-10-CM | POA: Diagnosis present

## 2019-10-09 DIAGNOSIS — I5022 Chronic systolic (congestive) heart failure: Secondary | ICD-10-CM | POA: Diagnosis present

## 2019-10-09 DIAGNOSIS — J9621 Acute and chronic respiratory failure with hypoxia: Secondary | ICD-10-CM | POA: Insufficient documentation

## 2019-10-09 NOTE — Progress Notes (Signed)
Pulmonary Critical Care Medicine [place]   PULMONARY CRITICAL CARE SERVICE  PROGRESS NOTE  Date of Service: 10/09/2019  Chelsea Allison  OJJ:[id number]  DOB: 06-17-1955   DOA: 10/04/2019  Referring Physician: Merton Border, MD  HPI: Chelsea Allison is a 64 y.o. female seen for follow up of Acute on Chronic Respiratory Failure.  Patient is weaning on protocol today's goal is for 8 hours on pressure support  Medications: Reviewed on Rounds  Physical Exam:  Vitals: Temperature is 97.2 pulse 73 respiratory 24 blood pressure is 116/73  Ventilator Settings on pressure support FiO2 30% pressure 12/5  . General: Comfortable at this time . Eyes: Grossly normal lids, irises & conjunctiva . ENT: grossly tongue is normal . Neck: no obvious mass . Cardiovascular: S1 S2 normal no gallop . Respiratory: No rhonchi coarse breath sounds . Abdomen: soft . Skin: no rash seen on limited exam . Musculoskeletal: not rigid . Psychiatric:unable to assess . Neurologic: no seizure no involuntary movements         Lab Data:   Basic Metabolic Panel: Recent Labs  Lab 10/02/19 1332 10/03/19 0321 10/04/19 0144 10/05/19 0511  NA  --  137 137 138  K  --  4.0 3.8 3.5  CL  --  102 100 102  CO2  --  25 30 26   GLUCOSE  --  126* 111* 101*  BUN  --  30* 28* 27*  CREATININE  --  0.62 0.69 0.61  CALCIUM  --  9.2 9.5 9.5  MG 2.4  --   --   --   PHOS 5.4*  --   --   --     ABG: Recent Labs  Lab 10/05/19 0038  PHART 7.539*  PCO2ART 32.3  PO2ART 171*  HCO3 27.4  O2SAT 100.0    Liver Function Tests: Recent Labs  Lab 10/05/19 0511  AST 15  ALT 10  ALKPHOS 76  BILITOT 0.2*  PROT 5.4*  ALBUMIN 2.4*   No results for input(s): LIPASE, AMYLASE in the last 168 hours. No results for input(s): AMMONIA in the last 168 hours.  CBC: Recent Labs  Lab 10/03/19 0321 10/04/19 0144 10/05/19 0511  WBC 9.9 7.8 7.9  HGB 8.1* 8.1* 7.9*  HCT 28.3* 27.9* 27.5*  MCV 95.6 95.9 94.2   PLT 271 281 280    Cardiac Enzymes: No results for input(s): CKTOTAL, CKMB, CKMBINDEX, TROPONINI in the last 168 hours.  BNP (last 3 results) Recent Labs    08/10/19 2150 08/13/19 0225 09/05/19 0735  BNP 318.9* 929.4* 2,477.5*    ProBNP (last 3 results) No results for input(s): PROBNP in the last 8760 hours.  Radiological Exams: No results found.  Assessment/Plan Active Problems:   Acute on chronic respiratory failure with hypoxia (HCC)   Coronary artery disease involving native coronary artery of native heart without angina pectoris   COPD, severe (HCC)   Chronic HFrEF (heart failure with reduced ejection fraction) ([place])   1. Acute on chronic respiratory failure hypoxia we will continue with the wean protocol today's goal is for 8 hours on pressure support mode. 2. Coronary artery disease history of multiple MI continue with supportive care 3. Severe COPD medical management 4. Chronic heart failure reduced ejection fraction monitor fluid status right now appears to be compensated   I have personally seen and evaluated the patient, evaluated laboratory and imaging results, formulated the assessment and plan and placed orders. The Patient requires high complexity decision making with multiple  systems involvement.  Rounds were done with the Respiratory Therapy Director and Staff therapists and discussed with nursing staff also.  Allyne Gee, MD [facility] Pulmonary Critical Care Medicine Sleep Medicine

## 2019-10-10 ENCOUNTER — Other Ambulatory Visit (HOSPITAL_COMMUNITY): Payer: Medicare HMO

## 2019-10-10 LAB — BRAIN NATRIURETIC PEPTIDE: B Natriuretic Peptide: 973.2 pg/mL — ABNORMAL HIGH (ref 0.0–100.0)

## 2019-10-10 NOTE — Progress Notes (Signed)
Pulmonary Critical Care Medicine [place]   PULMONARY CRITICAL CARE SERVICE  PROGRESS NOTE  Date of Service: 10/10/2019  Chelsea Allison  QMG:[id number]  DOB: 1955/06/14   DOA: 10/04/2019  Referring Physician: Merton Border, MD  HPI: Chelsea Allison is a 64 y.o. female seen for follow up of Acute on Chronic Respiratory Failure.  Patient was attempted at weaning this morning developed increased work of breathing anxiety so had to be placed back on the ventilator  Medications: Reviewed on Rounds  Physical Exam:  Vitals: Temperature 96.6 pulse 74 respiratory rate 24 blood pressure is 99/63 saturations 100%  Ventilator Settings was on pressure support FiO2 35% tidal volume 5 pressure support 12 PEEP 5  . General: Comfortable at this time . Eyes: Grossly normal lids, irises & conjunctiva . ENT: grossly tongue is normal . Neck: no obvious mass . Cardiovascular: S1 S2 normal no gallop . Respiratory: Scattered rhonchi expansion equal . Abdomen: soft . Skin: no rash seen on limited exam . Musculoskeletal: not rigid . Psychiatric:unable to assess . Neurologic: no seizure no involuntary movements         Lab Data:   Basic Metabolic Panel: Recent Labs  Lab 10/04/19 0144 10/05/19 0511  NA 137 138  K 3.8 3.5  CL 100 102  CO2 30 26  GLUCOSE 111* 101*  BUN 28* 27*  CREATININE 0.69 0.61  CALCIUM 9.5 9.5    ABG: Recent Labs  Lab 10/05/19 0038  PHART 7.539*  PCO2ART 32.3  PO2ART 171*  HCO3 27.4  O2SAT 100.0    Liver Function Tests: Recent Labs  Lab 10/05/19 0511  AST 15  ALT 10  ALKPHOS 76  BILITOT 0.2*  PROT 5.4*  ALBUMIN 2.4*   No results for input(s): LIPASE, AMYLASE in the last 168 hours. No results for input(s): AMMONIA in the last 168 hours.  CBC: Recent Labs  Lab 10/04/19 0144 10/05/19 0511  WBC 7.8 7.9  HGB 8.1* 7.9*  HCT 27.9* 27.5*  MCV 95.9 94.2  PLT 281 280    Cardiac Enzymes: No results for input(s): CKTOTAL, CKMB,  CKMBINDEX, TROPONINI in the last 168 hours.  BNP (last 3 results) Recent Labs    08/10/19 2150 08/13/19 0225 09/05/19 0735  BNP 318.9* 929.4* 2,477.5*    ProBNP (last 3 results) No results for input(s): PROBNP in the last 8760 hours.  Radiological Exams: No results found.  Assessment/Plan Active Problems:   Acute on chronic respiratory failure with hypoxia (HCC)   Coronary artery disease involving native coronary artery of native heart without angina pectoris   COPD, severe (HCC)   Chronic HFrEF (heart failure with reduced ejection fraction) ([place])   1. Acute on chronic respiratory failure with hypoxia patient was attempted on the wean as already noted above decompensated now back on the ventilator.  Chest x-ray also because there was noted some fluffy secretions. 2. Coronary artery disease at baseline we will continue with supportive care 3. Severe COPD medical management 4. Chronic heart failure reduced ejection fraction 35 to 40% medical management   I have personally seen and evaluated the patient, evaluated laboratory and imaging results, formulated the assessment and plan and placed orders. The Patient requires high complexity decision making with multiple systems involvement.  Rounds were done with the Respiratory Therapy Director and Staff therapists and discussed with nursing staff also.  Allyne Gee, MD [facility] Pulmonary Critical Care Medicine Sleep Medicine

## 2019-10-11 ENCOUNTER — Other Ambulatory Visit (HOSPITAL_COMMUNITY): Payer: Medicare HMO

## 2019-10-11 LAB — BASIC METABOLIC PANEL
Anion gap: 11 (ref 5–15)
BUN: 47 mg/dL — ABNORMAL HIGH (ref 8–23)
CO2: 28 mmol/L (ref 22–32)
Calcium: 9.5 mg/dL (ref 8.9–10.3)
Chloride: 103 mmol/L (ref 98–111)
Creatinine, Ser: 0.74 mg/dL (ref 0.44–1.00)
GFR calc Af Amer: >60 mL/min (ref >60)
GFR calc non Af Amer: >60 mL/min (ref >60)
Glucose, Bld: 134 mg/dL — ABNORMAL HIGH (ref 70–99)
Potassium: 3.7 mmol/L (ref 3.5–5.1)
Sodium: 142 mmol/L (ref 135–145)

## 2019-10-11 LAB — CBC
HCT: 31.1 % — ABNORMAL LOW (ref 36.0–46.0)
Hemoglobin: 8.9 g/dL — ABNORMAL LOW (ref 12.0–15.0)
MCH: 27.1 pg (ref 26.0–34.0)
MCHC: 28.6 g/dL — ABNORMAL LOW (ref 30.0–36.0)
MCV: 94.8 fL (ref 80.0–100.0)
Platelets: 298 10*3/uL (ref 150–400)
RBC: 3.28 MIL/uL — ABNORMAL LOW (ref 3.87–5.11)
RDW: 18.2 % — ABNORMAL HIGH (ref 11.5–15.5)
WBC: 7 10*3/uL (ref 4.0–10.5)
nRBC: 0 % (ref 0.0–0.2)

## 2019-10-11 NOTE — Progress Notes (Signed)
Pulmonary Critical Care Medicine [place]   PULMONARY CRITICAL CARE SERVICE  PROGRESS NOTE  Date of Service: 10/11/2019  Chelsea Allison  ZDG:[id number]  DOB: January 16, 1956   DOA: 10/04/2019  Referring Physician: Merton Border, MD  HPI: Chelsea Allison is a 64 y.o. female seen for follow up of Acute on Chronic Respiratory Failure.  Patient remains on pressure support has been on 40% FiO2 12/5  Medications: Reviewed on Rounds  Physical Exam:  Vitals: Temperature is 98.1 pulse 80 respiratory 21 blood pressure is 122/80 saturations 94%  Ventilator Settings on pressure support FiO2 of 40% pressure poor 12 PEEP 5  . General: Comfortable at this time . Eyes: Grossly normal lids, irises & conjunctiva . ENT: grossly tongue is normal . Neck: no obvious mass . Cardiovascular: S1 S2 normal no gallop . Respiratory: No rhonchi very coarse breath sounds . Abdomen: soft . Skin: no rash seen on limited exam . Musculoskeletal: not rigid . Psychiatric:unable to assess . Neurologic: no seizure no involuntary movements         Lab Data:   Basic Metabolic Panel: Recent Labs  Lab 10/05/19 0511 10/11/19 0725  NA 138 142  K 3.5 3.7  CL 102 103  CO2 26 28  GLUCOSE 101* 134*  BUN 27* 47*  CREATININE 0.61 0.74  CALCIUM 9.5 9.5    ABG: Recent Labs  Lab 10/05/19 0038  PHART 7.539*  PCO2ART 32.3  PO2ART 171*  HCO3 27.4  O2SAT 100.0    Liver Function Tests: Recent Labs  Lab 10/05/19 0511  AST 15  ALT 10  ALKPHOS 76  BILITOT 0.2*  PROT 5.4*  ALBUMIN 2.4*   No results for input(s): LIPASE, AMYLASE in the last 168 hours. No results for input(s): AMMONIA in the last 168 hours.  CBC: Recent Labs  Lab 10/05/19 0511 10/11/19 0725  WBC 7.9 7.0  HGB 7.9* 8.9*  HCT 27.5* 31.1*  MCV 94.2 94.8  PLT 280 298    Cardiac Enzymes: No results for input(s): CKTOTAL, CKMB, CKMBINDEX, TROPONINI in the last 168 hours.  BNP (last 3 results) Recent Labs     08/13/19 0225 09/05/19 0735 10/10/19 1120  BNP 929.4* 2,477.5* 973.2*    ProBNP (last 3 results) No results for input(s): PROBNP in the last 8760 hours.  Radiological Exams: CT ABDOMEN WO CONTRAST  Result Date: 10/11/2019 CLINICAL DATA:  Preop planning for gastrostomy placement EXAM: CT ABDOMEN WITHOUT CONTRAST TECHNIQUE: Multidetector CT imaging of the abdomen was performed following the standard protocol without IV contrast. COMPARISON:  08/11/2019 FINDINGS: Lower chest: New small pleural effusions, left greater than right. Consolidation/atelectasis posteriorly in the lung bases left worse than right. Coronary calcifications. Hepatobiliary: Hepatomegaly, liver measuring at least 25 cm length, inferior extent in the pelvis not completely visualized. No focal liver abnormality is seen. No gallstones, gallbladder wall thickening, or biliary dilatation. Pancreas: Unremarkable. No pancreatic ductal dilatation or surrounding inflammatory changes. Spleen: Normal in size without focal abnormality. Adrenals/Urinary Tract: Adrenal glands are unremarkable. Kidneys are normal, without renal calculi, focal lesion, or hydronephrosis. Stomach/Bowel: Stomach is incompletely distended. There is a broad percutaneous window for gastrostomy placement. Feeding tube extends to the gastric antrum. Visualized small bowel and colon are nondilated. Left lower quadrant ostomy with parastomal hernia containing a portion of the transverse colon and loop of small bowel, without evidence of obstruction or strangulation. Scattered colonic diverticula. Vascular/Lymphatic: Moderate aortoiliac atheromatous calcifications. No adenopathy. Other: No ascites.  No free air. Musculoskeletal: No acute or significant  osseous findings. IMPRESSION: 1. There is a percutaneous window for gastrostomy placement. 2. Left lower quadrant ostomy with parastomal hernia containing a portion of the transverse colon and loop of small bowel, without evidence  of obstruction or strangulation. 3. New small pleural effusions, left greater than right, with adjacent consolidation/atelectasis in the lung bases left worse than right. 4. Hepatomegaly. 5. Coronary and Aortic Atherosclerosis (ICD10-I70.0). Electronically Signed   By: Lucrezia Europe M.D.   On: 10/11/2019 07:52   DG CHEST PORT 1 VIEW  Result Date: 10/10/2019 CLINICAL DATA:  Tracheostomy secretions EXAM: PORTABLE CHEST 1 VIEW COMPARISON:  October 04, 2019 chest radiograph and chest CT August 11, 2019 FINDINGS: Tracheostomy catheter tip is 4.7 cm above the carina. Feeding tube tip is below the diaphragm. No pneumothorax. There is scarring with presumed post radiation therapy change in the right perihilar region. There is diffuse interstitial and alveolar opacity in the lungs bilaterally, a change from most recent chest radiograph. Heart is upper normal in size. Pulmonary vascularity within normal limits on the left and partially obscured by scarring on the right. There is aortic atherosclerosis. No adenopathy evident by radiography. No bone lesions. IMPRESSION: 1.  Tube positions as described without pneumothorax. 2. Diffuse interstitial and airspace opacity bilaterally, somewhat more on the left than on the right, suspicious for multifocal pneumonia. A degree of pulmonary edema could present in this manner and may be superimposed on pneumonia. 3. Stable cardiac silhouette. Post radiation therapy change in postoperative change right perihilar region noted. 4.  Aortic Atherosclerosis (ICD10-I70.0). Electronically Signed   By: Lowella Grip III M.D.   On: 10/10/2019 11:38    Assessment/Plan Active Problems:   Acute on chronic respiratory failure with hypoxia (HCC)   Coronary artery disease involving native coronary artery of native heart without angina pectoris   COPD, severe (HCC)   Chronic HFrEF (heart failure with reduced ejection fraction) ([place])   1. Acute on chronic respiratory failure hypoxia we will  continue with pressure support mode currently on 40% FiO2 titrate oxygen continue pulmonary toilet. 2. Coronary artery disease no active pain is noted. 3. Severe COPD treated 4. Chronic heart failure reduced ejection fraction supportive care.  We will continue to follow along   I have personally seen and evaluated the patient, evaluated laboratory and imaging results, formulated the assessment and plan and placed orders. The Patient requires high complexity decision making with multiple systems involvement.  Rounds were done with the Respiratory Therapy Director and Staff therapists and discussed with nursing staff also.  Allyne Gee, MD [facility] Pulmonary Critical Care Medicine Sleep Medicine

## 2019-10-11 NOTE — Consult Note (Signed)
Chief Complaint: Patient was seen in consultation today for percutaneous gastrostomy placement.  Referring Physician(s): Dr. Laren Everts  Supervising Physician: Corrie Mckusick  Patient Status: [facility]  History of Present Illness: Chelsea Allison is a 64 y.o. female with a past medical history significant for CHF, HTN, HLD, CAD, STEMI s/p PCI, anal cancer s/p colostomy, NSCLS s/p partial resection, COPD s/p tracheostomy placement with failure to wean from the ventilator seen today for possible percutaneous gastrostomy placement. Chelsea Allison presented to [facility] ED on 6/29 with complaints of respiratory distress - she was found to be altered and having difficulty breathing at home. She had a prolonged hospital course resulting in tracheostomy placement. She has been transferred to [facility] and has unfortunately been unable to wean from the ventilator. IR has been asked to place a percutaneous gastrostomy tube for long term nutrition needs.  Chelsea Allison seen in her room today, she is A&Ox3 and able to respond to questions by writing on a notepad. She is worried that a g-tube can damage her colon or colostomy. She is also asking when the NG tube can come out and if it will hurt being removed. She understands that she is on Brilinta which will need to be held prior to placement due to increased bleeding risk. She wants to try and continue to wean from the ventilator/pass a swallow evaluation prior to placement of the g tube but is ultimately agreeable to proceed if needed.  Past Medical History:  Diagnosis Date  . Acute on chronic respiratory failure with hypoxia ([place])   . Acute on chronic respiratory failure with hypoxia ([place])   . CAD (coronary artery disease) 10/20 DES to pRCA, DES mCx   . Cancer of lung ([place])    Twice  . Chronic congestive heart failure ([place])   . Chronic HFrEF (heart failure with reduced ejection fraction) ([place])   . Colon cancer ([place])   . COPD (chronic obstructive pulmonary  disease) ([place])   . COPD, severe ([place])   . COPD, severe ([place])   . Coronary artery disease involving native coronary artery of native heart without angina pectoris   . Hyperlipidemia   . Hypertension   . Ischemic cardiomyopathy   . Neuropathy    Secondary to chemo  . STEMI (ST elevation myocardial infarction) ([place]) 12/12/2018  . Tobacco abuse     Past Surgical History:  Procedure Laterality Date  . ABDOMINAL SURGERY     For colon cancer  . COLOSTOMY    . CORONARY STENT INTERVENTION N/A 12/20/2018   Procedure: CORONARY STENT INTERVENTION - Right;  Surgeon: Burnell Blanks, MD;  Location: [place] [facility];  Service: Cardiovascular;  Laterality: N/A;  . CORONARY/GRAFT ACUTE MI REVASCULARIZATION N/A 12/12/2018   Procedure: Coronary/Graft Acute MI Revascularization;  Surgeon: Belva Crome, MD;  Location: [place] [facility];  Service: Cardiovascular;  Laterality: N/A;  . LEFT HEART CATH AND CORONARY ANGIOGRAPHY N/A 12/12/2018   Procedure: LEFT HEART CATH AND CORONARY ANGIOGRAPHY;  Surgeon: Belva Crome, MD;  Location: [place] [facility];  Service: Cardiovascular;  Laterality: N/A;  . LEFT HEART CATH AND CORONARY ANGIOGRAPHY N/A 08/11/2019   Procedure: LEFT HEART CATH AND CORONARY ANGIOGRAPHY;  Surgeon: Burnell Blanks, MD;  Location: [place] [facility];  Service: Cardiovascular;  Laterality: N/A;    Allergies: Statins, Hydrochlorothiazide, Hydrocodone, Lisinopril, and Oxycodone  Medications: Prior to Admission medications   Medication Sig Start Date End Date Taking? Authorizing Provider  acetaminophen (TYLENOL) 325 MG  tablet Place 2 tablets (650 mg total) into feeding tube every 6 (six) hours as needed for mild pain, fever or headache. 10/04/19   Elgergawy, Silver Huguenin, MD  albuterol (PROVENTIL) (2.5 MG/3ML) 0.083% nebulizer solution Take 3 mLs (2.5 mg total) by nebulization every 2 (two) hours as needed for wheezing or shortness of breath. 10/04/19   Elgergawy, Silver Huguenin, MD    arformoterol (BROVANA) 15 MCG/2ML NEBU Take 2 mLs (15 mcg total) by nebulization 2 (two) times daily. 10/04/19   Elgergawy, Silver Huguenin, MD  BREO ELLIPTA 100-25 MCG/INH AEPB Inhale 1 puff into the lungs daily. 08/29/19   Reed, Tiffany L, DO  budesonide (PULMICORT) 0.25 MG/2ML nebulizer solution Take 2 mLs (0.25 mg total) by nebulization 2 (two) times daily. 10/04/19   Elgergawy, Silver Huguenin, MD  buprenorphine (BUTRANS) 20 MCG/HR PTWK Place 1 patch onto the skin once a week. 08/30/19   [provider]  clonazePAM (KLONOPIN) 0.5 MG tablet Place 1 tablet (0.5 mg total) into feeding tube 2 (two) times daily. 10/04/19   Elgergawy, Silver Huguenin, MD  docusate (COLACE) 50 MG/5ML liquid Place 10 mLs (100 mg total) into feeding tube 2 (two) times daily. 10/04/19   Elgergawy, Silver Huguenin, MD  enoxaparin (LOVENOX) 40 MG/0.4ML injection Inject 0.4 mLs (40 mg total) into the skin daily. 10/05/19   Elgergawy, Silver Huguenin, MD  guaiFENesin (ROBITUSSIN) 100 MG/5ML SOLN Place 15 mLs (300 mg total) into feeding tube every 6 (six) hours. 10/04/19   Elgergawy, Silver Huguenin, MD  insulin aspart (NOVOLOG) 100 UNIT/ML injection Inject 0-15 Units into the skin every 4 (four) hours. 10/04/19   Elgergawy, Silver Huguenin, MD  Nutritional Supplements (FEEDING SUPPLEMENT, VITAL 1.5 CAL,) LIQD Place 1,000 mLs into feeding tube continuous. 60 ml/hour 10/04/19   Elgergawy, Silver Huguenin, MD  oxyCODONE 10 MG TABS Place 0.5 tablets (5 mg total) into feeding tube every 6 (six) hours as needed for severe pain. 10/04/19   Elgergawy, Silver Huguenin, MD  pantoprazole sodium (PROTONIX) 40 mg/20 mL PACK Place 20 mLs (40 mg total) into feeding tube daily. 10/05/19   Elgergawy, Silver Huguenin, MD  polyethylene glycol (MIRALAX / GLYCOLAX) 17 g packet Place 17 g into feeding tube daily. 10/05/19   Elgergawy, Silver Huguenin, MD  QUEtiapine (SEROQUEL) 25 MG tablet Place 1 tablet (25 mg total) into feeding tube 2 (two) times daily. 10/04/19   Elgergawy, Silver Huguenin, MD  revefenacin (YUPELRI) 175 MCG/3ML  nebulizer solution Take 3 mLs (175 mcg total) by nebulization daily. 10/05/19   Elgergawy, Silver Huguenin, MD  rosuvastatin (CRESTOR) 20 MG tablet Place 1 tablet (20 mg total) into feeding tube daily. 10/05/19   Elgergawy, Silver Huguenin, MD  ticagrelor (BRILINTA) 90 MG TABS tablet Place 1 tablet (90 mg total) into feeding tube 2 (two) times daily. 10/04/19   Elgergawy, Silver Huguenin, MD  umeclidinium bromide (INCRUSE ELLIPTA) 62.5 MCG/INH AEPB Inhale 1 puff into the lungs daily. 08/29/19   Reed, Tiffany L, DO  vitamin B-12 1000 MCG tablet Place 1 tablet (1,000 mcg total) into feeding tube every other day. 10/05/19   Elgergawy, Silver Huguenin, MD  Vitamin D, Ergocalciferol, (DRISDOL) 1.25 MG (50000 UNIT) CAPS capsule Place 1 capsule (50,000 Units total) into feeding tube every 7 (seven) days. 10/11/19   Elgergawy, Silver Huguenin, MD  Water For Irrigation, Sterile (FREE WATER) SOLN Place 100 mLs into feeding tube every 8 (eight) hours. 10/04/19   Elgergawy, Silver Huguenin, MD     Family History  Problem Relation Age of  Onset  . Hypertension Mother   . Hypertension Father     Social History   Socioeconomic History  . Marital status: Divorced    Spouse name: Not on file  . Number of children: Not on file  . Years of education: Not on file  . Highest education level: Not on file  Occupational History  . Not on file  Tobacco Use  . Smoking status: Current Every Day Smoker    Types: Cigarettes  . Smokeless tobacco: Never Used  Vaping Use  . Vaping Use: Never used  Substance and Sexual Activity  . Alcohol use: Never  . Drug use: Never  . Sexual activity: Not on file  Other Topics Concern  . Not on file  Social History Narrative   Patient lives in [place].   Social Determinants of Health   Financial Resource Strain:   . Difficulty of Paying Living Expenses:   Food Insecurity:   . Worried About Charity fundraiser in the Last Year:   . Arboriculturist in the Last Year:   Transportation Needs:   . Lexicographer (Medical):   Marland Kitchen Lack of Transportation (Non-Medical):   Physical Activity:   . Days of Exercise per Week:   . Minutes of Exercise per Session:   Stress:   . Feeling of Stress :   Social Connections:   . Frequency of Communication with Friends and Family:   . Frequency of Social Gatherings with Friends and Family:   . Attends Religious Services:   . Active Member of Clubs or Organizations:   . Attends Archivist Meetings:   Marland Kitchen Marital Status:      Review of Systems: A 12 point ROS discussed and pertinent positives are indicated in the HPI above.  All other systems are negative.  Review of Systems  Constitutional: Negative for chills and fever.  Respiratory: Positive for shortness of breath (when taken off the vent). Negative for cough.   Cardiovascular: Negative for chest pain.  Gastrointestinal: Negative for abdominal pain, nausea and vomiting.  Musculoskeletal: Negative for back pain.    Vital Signs: There were no vitals taken for this visit.  Physical Exam Vitals reviewed.  Constitutional:      General: She is not in acute distress.    Comments: Able to communicate by writing on notepad, asks appropriate questions, pleasant.  HENT:     Head: Normocephalic.     Mouth/Throat:     Comments: (+) tracheostomy, ventilator Cardiovascular:     Rate and Rhythm: Normal rate and regular rhythm.  Pulmonary:     Effort: Pulmonary effort is normal.     Breath sounds: Normal breath sounds.     Comments: (+) tracheostomy, ventilator Abdominal:     General: There is no distension.     Palpations: Abdomen is soft.     Tenderness: There is no abdominal tenderness.     Comments: (+) NG in place  Skin:    General: Skin is warm and dry.  Neurological:     Mental Status: She is alert and oriented to person, place, and time.  Psychiatric:        Mood and Affect: Mood normal.        Behavior: Behavior normal.        Thought Content: Thought content normal.          Judgment: Judgment normal.       MD Evaluation Airway: WNL (tracheostomy) Heart: WNL Abdomen: WNL  Chest/ Lungs: WNL ASA  Classification: 3 Mallampati/Airway Score:  (tracheostomy)   Imaging: CT ABDOMEN WO CONTRAST  Result Date: 10/11/2019 CLINICAL DATA:  Preop planning for gastrostomy placement EXAM: CT ABDOMEN WITHOUT CONTRAST TECHNIQUE: Multidetector CT imaging of the abdomen was performed following the standard protocol without IV contrast. COMPARISON:  08/11/2019 FINDINGS: Lower chest: New small pleural effusions, left greater than right. Consolidation/atelectasis posteriorly in the lung bases left worse than right. Coronary calcifications. Hepatobiliary: Hepatomegaly, liver measuring at least 25 cm length, inferior extent in the pelvis not completely visualized. No focal liver abnormality is seen. No gallstones, gallbladder wall thickening, or biliary dilatation. Pancreas: Unremarkable. No pancreatic ductal dilatation or surrounding inflammatory changes. Spleen: Normal in size without focal abnormality. Adrenals/Urinary Tract: Adrenal glands are unremarkable. Kidneys are normal, without renal calculi, focal lesion, or hydronephrosis. Stomach/Bowel: Stomach is incompletely distended. There is a broad percutaneous window for gastrostomy placement. Feeding tube extends to the gastric antrum. Visualized small bowel and colon are nondilated. Left lower quadrant ostomy with parastomal hernia containing a portion of the transverse colon and loop of small bowel, without evidence of obstruction or strangulation. Scattered colonic diverticula. Vascular/Lymphatic: Moderate aortoiliac atheromatous calcifications. No adenopathy. Other: No ascites.  No free air. Musculoskeletal: No acute or significant osseous findings. IMPRESSION: 1. There is a percutaneous window for gastrostomy placement. 2. Left lower quadrant ostomy with parastomal hernia containing a portion of the transverse colon and loop of  small bowel, without evidence of obstruction or strangulation. 3. New small pleural effusions, left greater than right, with adjacent consolidation/atelectasis in the lung bases left worse than right. 4. Hepatomegaly. 5. Coronary and Aortic Atherosclerosis (ICD10-I70.0). Electronically Signed   By: Lucrezia Europe M.D.   On: 10/11/2019 07:52   DG Chest 1 View  Result Date: 09/25/2019 CLINICAL DATA:  Respiratory failure EXAM: CHEST  1 VIEW COMPARISON:  09/23/2019, 09/19/2019 FINDINGS: Tracheostomy is in place. Pulmonary insufflation is stable since prior examination. Right suprahilar mass, compatible with post radiation paramediastinal fibrosis, and associated mild right-sided volume loss is again noted. There is progressive airspace infiltrate within the mid and upper lung zone of the left lung when compared to prior examination. No pneumothorax or pleural effusion. Cardiac size within normal limits. Nasoenteric feeding tube extends into the upper abdomen beyond the margin of the examination. IMPRESSION: Progressive left lung infiltrate, in keeping with acute pneumonia. Electronically Signed   By: Fidela Salisbury MD   On: 09/25/2019 23:09   DG CHEST PORT 1 VIEW  Result Date: 10/10/2019 CLINICAL DATA:  Tracheostomy secretions EXAM: PORTABLE CHEST 1 VIEW COMPARISON:  October 04, 2019 chest radiograph and chest CT August 11, 2019 FINDINGS: Tracheostomy catheter tip is 4.7 cm above the carina. Feeding tube tip is below the diaphragm. No pneumothorax. There is scarring with presumed post radiation therapy change in the right perihilar region. There is diffuse interstitial and alveolar opacity in the lungs bilaterally, a change from most recent chest radiograph. Heart is upper normal in size. Pulmonary vascularity within normal limits on the left and partially obscured by scarring on the right. There is aortic atherosclerosis. No adenopathy evident by radiography. No bone lesions. IMPRESSION: 1.  Tube positions as described  without pneumothorax. 2. Diffuse interstitial and airspace opacity bilaterally, somewhat more on the left than on the right, suspicious for multifocal pneumonia. A degree of pulmonary edema could present in this manner and may be superimposed on pneumonia. 3. Stable cardiac silhouette. Post radiation therapy change in postoperative change right perihilar region noted.  4.  Aortic Atherosclerosis (ICD10-I70.0). Electronically Signed   By: Lowella Grip III M.D.   On: 10/10/2019 11:38   DG Chest Port 1 View  Result Date: 10/04/2019 CLINICAL DATA:  Respiratory failure EXAM: PORTABLE CHEST 1 VIEW COMPARISON:  09/30/2019 FINDINGS: Tracheostomy tube and feeding catheter are noted in satisfactory position. Chronic right perihilar density is noted overall stable appearance from the prior exam as well as multiple previous studies dating back to at least April of 2021 consistent with prior treatment. Suture lines are noted in this region as well. The left lung is well aerated. Mild volume loss on the right is seen. No bony abnormality is noted. IMPRESSION: Chronic changes in the right perihilar region related to prior surgery and treatment. No acute abnormality is noted. Electronically Signed   By: Inez Catalina M.D.   On: 10/04/2019 23:18   DG CHEST PORT 1 VIEW  Result Date: 09/30/2019 CLINICAL DATA:  Respiratory distress. EXAM: PORTABLE CHEST 1 VIEW COMPARISON:  09/27/2019 and 09/25/2019 and CT 08/11/2019 FINDINGS: Enteric tube courses into the stomach and off the film as tip is not visualized. Tracheostomy tube in adequate position. Lungs are adequately inflated with stable subtle hazy perihilar/suprahilar prominence. Stable increased soft tissue density/postsurgical change over the right paramediastinal region. Cardiomediastinal silhouette and remainder the exam is unchanged. IMPRESSION: 1. Stable subtle hazy interstitial prominence over the perihilar/infrahilar regions which may be due to mild vascular  congestion or underlying infection. Stable prominent soft tissue density/postsurgical change over the right paramediastinal region. 2.  Tubes and lines as described. Electronically Signed   By: Marin Olp M.D.   On: 09/30/2019 11:29   DG CHEST PORT 1 VIEW  Result Date: 09/27/2019 CLINICAL DATA:  History of cardiomyopathy and respiratory difficulty EXAM: PORTABLE CHEST 1 VIEW COMPARISON:  09/25/2018 FINDINGS: Cardiac shadow is stable. Tracheostomy tube and feeding catheter are again noted and stable. Lungs are well aerated bilaterally with significant improved aeration on the left. Some residual opacity is noted in the left mid lung. IMPRESSION: Near complete resolution of previously seen infiltrate. Electronically Signed   By: Inez Catalina M.D.   On: 09/27/2019 14:46   DG Chest Port 1 View  Result Date: 09/23/2019 CLINICAL DATA:  64 year old female with respiratory failure EXAM: PORTABLE CHEST 1 VIEW COMPARISON:  09/23/2019 FINDINGS: Cardiomediastinal silhouette is unchanged in size and contour. Unchanged tracheostomy tube. Unchanged enteric feeding tube which terminates out of the field of view. Similar appearance of architectural distortion in the right hilum in the right mid lung with tenting at the right hemidiaphragm. Similar appearance of mixed interstitial and airspace opacities in the left lung. No large pleural effusion or pneumothorax. IMPRESSION: Similar appearance of the chest x-ray, with persisting mixed interstitial and airspace disease of the left lung. Unchanged appearance of architectural distortion in the right hilum with tenting of the right hemidiaphragm. Unchanged tracheostomy tube and enteric feeding tube. Electronically Signed   By: Corrie Mckusick D.O.   On: 09/23/2019 09:02   DG CHEST PORT 1 VIEW  Result Date: 09/23/2019 CLINICAL DATA:  64 year old female with respiratory distress. EXAM: PORTABLE CHEST 1 VIEW COMPARISON:  Chest radiograph dated 09/22/2019. FINDINGS: Tracheostomy  above the carina. Feeding tube extends below the diaphragm with tip beyond the inferior margin of the image. Increased left lung interstitial and hazy airspace densities compared to the earlier radiograph which may represent developing infiltrate or aspiration. Clinical correlation is recommended. Radiation changes of the right hilar region with associated scarring. No lobar consolidation, large  pleural effusion, or pneumothorax. The cardiac silhouette is within limits. Coronary vascular calcification. Atherosclerotic calcification of the aorta. No acute osseous pathology. IMPRESSION: Slight interval increase in the left upper lobe pulmonary densities may represent developing infiltrate or aspiration. Clinical correlation is recommended. Electronically Signed   By: Anner Crete M.D.   On: 09/23/2019 02:14   DG CHEST PORT 1 VIEW  Result Date: 09/22/2019 CLINICAL DATA:  Shortness of breath EXAM: PORTABLE CHEST 1 VIEW COMPARISON:  Chest radiograph dated 09/19/2019. CT chest dated 08/11/2019. FINDINGS: Masslike opacity in the medial right upper hemithorax, likely reflecting radiation changes when correlating with prior CT. Volume loss with tenting of the right hemidiaphragm. Left lung is essentially clear, noting minimal basilar atelectasis. No pleural effusion or pneumothorax. Tracheostomy in satisfactory position. Enteric tube courses into the mid stomach. The heart is normal in size.  Thoracic aortic atherosclerosis. IMPRESSION: Radiation changes with volume loss in the right hemithorax. No evidence of acute cardiopulmonary disease. Electronically Signed   By: Julian Hy M.D.   On: 09/22/2019 09:04   DG Chest Port 1 View  Result Date: 09/19/2019 CLINICAL DATA:  Respiratory failure EXAM: PORTABLE CHEST 1 VIEW COMPARISON:  September 16, 2019 FINDINGS: Tracheostomy catheter tip is 5.5 cm above the carina. Feeding tube tip is below the diaphragm. No pneumothorax. Fibrotic change in the right perihilar  region is likely due to prior radiation therapy change. There is slight increased opacity in the left mid lung, likely due to atelectatic change. There is mild scarring in the right base. No airspace opacity evident. Heart size is normal. The pulmonary vascularity on the left is normal. There is retraction of pulmonary vascularity on the right due to the radiation therapy change. There is an apparent coronary stent. No adenopathy is appreciable by radiography. There is aortic atherosclerosis. IMPRESSION: Post radiation therapy change right perihilar region. Scarring right base, stable. Probable atelectatic change left mid lung. No new opacity evident. Stable cardiac silhouette. Tube positions as described without pneumothorax. Aortic Atherosclerosis (ICD10-I70.0). Electronically Signed   By: Lowella Grip III M.D.   On: 09/19/2019 07:52   DG Chest Port 1 View  Result Date: 09/16/2019 CLINICAL DATA:  Acute respiratory failure.  Hypoxemia. EXAM: PORTABLE CHEST 1 VIEW COMPARISON:  September 14, 2019 FINDINGS: No pneumothorax. The tracheostomy tube is stable. The feeding tube terminates below today's film. Mild hazy opacity in the left lung is similar in the interval, likely pneumonia. No change in the cardiomediastinal silhouette. The study is otherwise stable. IMPRESSION: 1. Support apparatus as above. 2. Mild hazy opacity on the left, particularly in the mid lung, is similar in the interval and worrisome for pneumonia. Recommend follow-up to resolution. Electronically Signed   By: Dorise Bullion III M.D   On: 09/16/2019 13:14   DG CHEST PORT 1 VIEW  Result Date: 09/14/2019 CLINICAL DATA:  Pneumonia, past history coronary artery disease post STEMI, ischemic cardiomyopathy, hypertension, lung cancer, colon cancer, smoker EXAM: PORTABLE CHEST 1 VIEW COMPARISON:  Portable exam 1157 hours compared to 09/12/2019 FINDINGS: Tracheostomy tube with tip projecting over tracheal air column. Feeding tube extending into  stomach. Normal heart size and pulmonary vascularity. Atherosclerotic calcification aorta. Volume loss in the RIGHT upper lobe with superior elevation of RIGHT diaphragm, abnormal RIGHT hilar density, and streaky scarring at RIGHT infrahilar region likely reflecting changes related to prior lung cancer treatment. LEFT mid lung/perihilar infiltrate difficult with pneumonia. Minimal LEFT base atelectasis. No pleural effusion or pneumothorax. IMPRESSION: LEFT lung infiltrate consistent with pneumonia,  little changed. Scarring and volume loss in RIGHT lung likely reflecting posttherapy changes related to lung cancer. Electronically Signed   By: Lavonia Dana M.D.   On: 09/14/2019 13:55   DG Chest Port 1 View  Result Date: 09/12/2019 CLINICAL DATA:  Tracheostomy, right upper lobe lung cancer status post right upper lobectomy and radiation therapy EXAM: PORTABLE CHEST 1 VIEW COMPARISON:  Chest radiograph from one day prior. FINDINGS: Tracheostomy tube tip terminates over the tracheal air column at the level of the thoracic inlet. Stable cardiomediastinal silhouette with normal heart size. No pneumothorax. No pleural effusion. New patchy opacity at the left lung base obscuring the left hemidiaphragm. Stable volume loss in the right hemithorax. Stable right upper parahilar ill-defined opacity with associated parenchymal distortion. IMPRESSION: 1. Tracheostomy in place. 2. New patchy left lung base opacity, which could represent atelectasis, aspiration and/or pneumonia. 3. Stable post surgical and post treatment changes in the right hemithorax. Electronically Signed   By: Ilona Sorrel M.D.   On: 09/12/2019 09:36   DG Abd Portable 1V  Result Date: 10/04/2019 CLINICAL DATA:  Check gastric catheter placement EXAM: PORTABLE ABDOMEN - 1 VIEW COMPARISON:  08/14/2019 FINDINGS: Feeding catheter is been placed into the stomach and lies in the midportion of the gastric body. IMPRESSION: Feeding catheter within the midportion of  the stomach. Electronically Signed   By: Inez Catalina M.D.   On: 10/04/2019 23:17    Labs:  CBC: Recent Labs    10/03/19 0321 10/04/19 0144 10/05/19 0511 10/11/19 0725  WBC 9.9 7.8 7.9 7.0  HGB 8.1* 8.1* 7.9* 8.9*  HCT 28.3* 27.9* 27.5* 31.1*  PLT 271 281 280 298    COAGS: Recent Labs    12/12/18 0657 08/12/19 0727 09/12/19 0412  INR 1.0 1.1 1.1  APTT 30 29  --     BMP: Recent Labs    10/03/19 0321 10/04/19 0144 10/05/19 0511 10/11/19 0725  NA 137 137 138 142  K 4.0 3.8 3.5 3.7  CL 102 100 102 103  CO2 25 30 26 28   GLUCOSE 126* 111* 101* 134*  BUN 30* 28* 27* 47*  CALCIUM 9.2 9.5 9.5 9.5  CREATININE 0.62 0.69 0.61 0.74  GFRNONAA >60 >60 >60 >60  GFRAA >60 >60 >60 >60    LIVER FUNCTION TESTS: Recent Labs    09/05/19 0735 09/26/19 0505 09/30/19 0453 10/05/19 0511  BILITOT 0.6 0.4 0.2* 0.2*  AST 17 18 17 15   ALT 24 8 11 10   ALKPHOS 81 77 67 76  PROT 6.2* 6.6 5.9* 5.4*  ALBUMIN 2.5* 2.5* 2.5* 2.4*    TUMOR MARKERS: No results for input(s): AFPTM, CEA, CA199, CHROMGRNA in the last 8760 hours.  Assessment and Plan:  64 y/o F with past medical history significant for COPD with chronic respiratory failure s/p tracheostomy placement and recent inability to wean from the ventilator seen today for percutaneous gastrostomy placement for long term nutrition.   Patient is agreeable to proceed with g-tube placement however she would like to continue to attempt to wean from the ventilator/pass a swallow evaluation for a while longer. She is currently on Brilinta 90 mg BID which will need to be held x 5 days and Lovenox 40 mg QD which will need to be held x 1 dose to avoid significant bleeding during procedure. Will plan on g-tube placement on 8/10 if patient is unable to wean/pass swallow eval which she is agreeable to. She does not want to sign consent until the day  of the procedure.  Will plan on g-tube placement 8/10, Brilinta to be held starting 8/6, lovenox  to be held on 8/9. IR will call for patient when ready. Please call IR if patient does not want to proceed with placement.  Thank you for this interesting consult.  I greatly enjoyed meeting [facility] and look forward to participating in their care.  A copy of this report was sent to the requesting provider on this date.   Electronically Signed: Joaquim Nam, PA-C 10/11/2019, 10:09 AM   I spent a total of 40 Minutes  in face to face in clinical consultation, greater than 50% of which was counseling/coordinating care for gastrostomy placement.

## 2019-10-12 LAB — VANCOMYCIN, TROUGH: Vancomycin Tr: 13 ug/mL — ABNORMAL LOW (ref 15–20)

## 2019-10-12 NOTE — Progress Notes (Signed)
Pulmonary Critical Care Medicine [place]   PULMONARY CRITICAL CARE SERVICE  PROGRESS NOTE  Date of Service: 10/12/2019  Chelsea Allison  DGU:[id number]  DOB: 1955/04/02   DOA: 10/04/2019  Referring Physician: Merton Border, MD  HPI: Chelsea Allison is a 64 y.o. female seen for follow up of Acute on Chronic Respiratory Failure.  Patient is on pressure support has been on 40% FiO2 the goal today is for 16 hours  Medications: Reviewed on Rounds  Physical Exam:  Vitals: Temperature is 97.0 pulse 65 respiratory rate 24 blood pressure is 118/72 saturations 97%  Ventilator Settings on pressure support FiO2 40% pressure poor 12/5  . General: Comfortable at this time . Eyes: Grossly normal lids, irises & conjunctiva . ENT: grossly tongue is normal . Neck: no obvious mass . Cardiovascular: S1 S2 normal no gallop . Respiratory: No rhonchi very coarse breath sounds . Abdomen: soft . Skin: no rash seen on limited exam . Musculoskeletal: not rigid . Psychiatric:unable to assess . Neurologic: no seizure no involuntary movements         Lab Data:   Basic Metabolic Panel: Recent Labs  Lab 10/11/19 0725  NA 142  K 3.7  CL 103  CO2 28  GLUCOSE 134*  BUN 47*  CREATININE 0.74  CALCIUM 9.5    ABG: No results for input(s): PHART, PCO2ART, PO2ART, HCO3, O2SAT in the last 168 hours.  Liver Function Tests: No results for input(s): AST, ALT, ALKPHOS, BILITOT, PROT, ALBUMIN in the last 168 hours. No results for input(s): LIPASE, AMYLASE in the last 168 hours. No results for input(s): AMMONIA in the last 168 hours.  CBC: Recent Labs  Lab 10/11/19 0725  WBC 7.0  HGB 8.9*  HCT 31.1*  MCV 94.8  PLT 298    Cardiac Enzymes: No results for input(s): CKTOTAL, CKMB, CKMBINDEX, TROPONINI in the last 168 hours.  BNP (last 3 results) Recent Labs    08/13/19 0225 09/05/19 0735 10/10/19 1120  BNP 929.4* 2,477.5* 973.2*    ProBNP (last 3 results) No results for  input(s): PROBNP in the last 8760 hours.  Radiological Exams: CT ABDOMEN WO CONTRAST  Result Date: 10/11/2019 CLINICAL DATA:  Preop planning for gastrostomy placement EXAM: CT ABDOMEN WITHOUT CONTRAST TECHNIQUE: Multidetector CT imaging of the abdomen was performed following the standard protocol without IV contrast. COMPARISON:  08/11/2019 FINDINGS: Lower chest: New small pleural effusions, left greater than right. Consolidation/atelectasis posteriorly in the lung bases left worse than right. Coronary calcifications. Hepatobiliary: Hepatomegaly, liver measuring at least 25 cm length, inferior extent in the pelvis not completely visualized. No focal liver abnormality is seen. No gallstones, gallbladder wall thickening, or biliary dilatation. Pancreas: Unremarkable. No pancreatic ductal dilatation or surrounding inflammatory changes. Spleen: Normal in size without focal abnormality. Adrenals/Urinary Tract: Adrenal glands are unremarkable. Kidneys are normal, without renal calculi, focal lesion, or hydronephrosis. Stomach/Bowel: Stomach is incompletely distended. There is a broad percutaneous window for gastrostomy placement. Feeding tube extends to the gastric antrum. Visualized small bowel and colon are nondilated. Left lower quadrant ostomy with parastomal hernia containing a portion of the transverse colon and loop of small bowel, without evidence of obstruction or strangulation. Scattered colonic diverticula. Vascular/Lymphatic: Moderate aortoiliac atheromatous calcifications. No adenopathy. Other: No ascites.  No free air. Musculoskeletal: No acute or significant osseous findings. IMPRESSION: 1. There is a percutaneous window for gastrostomy placement. 2. Left lower quadrant ostomy with parastomal hernia containing a portion of the transverse colon and loop of small bowel, without  evidence of obstruction or strangulation. 3. New small pleural effusions, left greater than right, with adjacent  consolidation/atelectasis in the lung bases left worse than right. 4. Hepatomegaly. 5. Coronary and Aortic Atherosclerosis (ICD10-I70.0). Electronically Signed   By: Lucrezia Europe M.D.   On: 10/11/2019 07:52   DG CHEST PORT 1 VIEW  Result Date: 10/10/2019 CLINICAL DATA:  Tracheostomy secretions EXAM: PORTABLE CHEST 1 VIEW COMPARISON:  October 04, 2019 chest radiograph and chest CT August 11, 2019 FINDINGS: Tracheostomy catheter tip is 4.7 cm above the carina. Feeding tube tip is below the diaphragm. No pneumothorax. There is scarring with presumed post radiation therapy change in the right perihilar region. There is diffuse interstitial and alveolar opacity in the lungs bilaterally, a change from most recent chest radiograph. Heart is upper normal in size. Pulmonary vascularity within normal limits on the left and partially obscured by scarring on the right. There is aortic atherosclerosis. No adenopathy evident by radiography. No bone lesions. IMPRESSION: 1.  Tube positions as described without pneumothorax. 2. Diffuse interstitial and airspace opacity bilaterally, somewhat more on the left than on the right, suspicious for multifocal pneumonia. A degree of pulmonary edema could present in this manner and may be superimposed on pneumonia. 3. Stable cardiac silhouette. Post radiation therapy change in postoperative change right perihilar region noted. 4.  Aortic Atherosclerosis (ICD10-I70.0). Electronically Signed   By: Lowella Grip III M.D.   On: 10/10/2019 11:38    Assessment/Plan Active Problems:   Acute on chronic respiratory failure with hypoxia (HCC)   Coronary artery disease involving native coronary artery of native heart without angina pectoris   COPD, severe (HCC)   Chronic HFrEF (heart failure with reduced ejection fraction) ([place])   1. Acute on chronic respiratory failure hypoxia patient currently is on pressure support wean 12/5 goal is for 16 hours. 2. Coronary artery disease supportive care  we will continue to monitor 3. Severe COPD at baseline 4. Chronic heart failure reduced ejection fraction at baseline continue present management   I have personally seen and evaluated the patient, evaluated laboratory and imaging results, formulated the assessment and plan and placed orders. The Patient requires high complexity decision making with multiple systems involvement.  Rounds were done with the Respiratory Therapy Director and Staff therapists and discussed with nursing staff also.  Allyne Gee, MD [facility] Pulmonary Critical Care Medicine Sleep Medicine

## 2019-10-13 NOTE — Progress Notes (Addendum)
Pulmonary Critical Care Medicine [place]   PULMONARY CRITICAL CARE SERVICE  PROGRESS NOTE  Date of Service: 10/13/2019  Chelsea Allison  IRC:[id number]  DOB: [birth year]   DOA: 10/04/2019  Referring Physician: Merton Border, MD  HPI: Chelsea Allison is a 64 y.o. female seen for follow up of Acute on Chronic Respiratory Failure.  Patient continues on full support vent assist-control mode rate 24 with an FiO2 of 40% did complete 2 hours on aerosol trach collar today which was the goal.  No distress noted at this time.  Medications: Reviewed on Rounds  Physical Exam:  Vitals: Pulse 73 respirations 24 BP 99/54 O2 sat 99% temp 96.8  Ventilator Settings ventilator mode AC VC plus rate of 24 tidal volume 500 PEEP of 5 and FiO2 of 40%  . General: Comfortable at this time . Eyes: Grossly normal lids, irises & conjunctiva . ENT: grossly tongue is normal . Neck: no obvious mass . Cardiovascular: S1 S2 normal no gallop . Respiratory: Coarse breath sounds . Abdomen: soft . Skin: no rash seen on limited exam . Musculoskeletal: not rigid . Psychiatric:unable to assess . Neurologic: no seizure no involuntary movements         Lab Data:   Basic Metabolic Panel: Recent Labs  Lab 10/11/19 0725  NA 142  K 3.7  CL 103  CO2 28  GLUCOSE 134*  BUN 47*  CREATININE 0.74  CALCIUM 9.5    ABG: No results for input(s): PHART, PCO2ART, PO2ART, HCO3, O2SAT in the last 168 hours.  Liver Function Tests: No results for input(s): AST, ALT, ALKPHOS, BILITOT, PROT, ALBUMIN in the last 168 hours. No results for input(s): LIPASE, AMYLASE in the last 168 hours. No results for input(s): AMMONIA in the last 168 hours.  CBC: Recent Labs  Lab 10/11/19 0725  WBC 7.0  HGB 8.9*  HCT 31.1*  MCV 94.8  PLT 298    Cardiac Enzymes: No results for input(s): CKTOTAL, CKMB, CKMBINDEX, TROPONINI in the last 168 hours.  BNP (last 3 results) Recent Labs    08/13/19 0225 09/05/19 0735  10/10/19 1120  BNP 929.4* 2,477.5* 973.2*    ProBNP (last 3 results) No results for input(s): PROBNP in the last 8760 hours.  Radiological Exams: No results found.  Assessment/Plan Active Problems:   Acute on chronic respiratory failure with hypoxia (HCC)   Coronary artery disease involving native coronary artery of native heart without angina pectoris   COPD, severe (HCC)   Chronic HFrEF (heart failure with reduced ejection fraction) ([place])   1. Acute on chronic respiratory failure hypoxia patient is currently satting well on full support did do 2 hours of aerosol trach collar today which was the goal for weaning.  Continue to attempt weaning as tolerated per protocol.  Continue aggressive pulmonary toilet supportive measures. 2. Coronary artery disease supportive care we will continue to monitor 3. Severe COPD at baseline 4. Chronic heart failure reduced ejection fraction at baseline continue present management   I have personally seen and evaluated the patient, evaluated laboratory and imaging results, formulated the assessment and plan and placed orders. The Patient requires high complexity decision making with multiple systems involvement.  Rounds were done with the Respiratory Therapy Director and Staff therapists and discussed with nursing staff also.  Allyne Gee, MD [facility] Pulmonary Critical Care Medicine Sleep Medicine

## 2019-10-13 NOTE — Consult Note (Signed)
Infectious Disease Consultation   Chelsea Allison  OEV:[id number]  DOB: [birth year]  DOA: 10/04/2019  Requesting physician: Dr. Owens Shark  Reason for consultation: Antibiotic recommendations   History of Present Illness: Chelsea Allison is an 64 y.o. female with medical history significant of COPD, hypertension, coronary artery disease/STEMI in October 2020 status post PCI, hyperlipidemia, congestive heart failure, diabetes mellitus, non-small cell lung cancer stage III status post partial lung resection, anal cancer status post colectomy who was admitted on 08/10/2019 to [facility] when she presented to the ED via EMS for worsening respiratory failure.  She was found to be hypoxemic and was placed on CPAP by EMS.  In the ED she was found to be unresponsive and oxygen saturation 50% on CPAP.  She was placed on BiPAP.  Chest x-ray showed increased density of the right hilum.  CT angiogram negative for PE but showed mild groundglass tree-in-bud opacities throughout both lungs.  Stable ill-defined pulmonary nodules in the posterior left lower lobe and right middle lobe.  During her hospital course patient subsequently worsened and on 09/05/2018 when she was transferred to the ICU.  She required prolonged ventilation, required tracheostomy which was done on 09/12/2019.  Respiratory cultures showed E. coli/Klebsiella pneumonia.  She was treated initially with IV vancomycin and Zosyn and subsequently with IV Ancef.  She kept having copious secretions.  When she improved she was transferred back to the floor.  However, on 09/25/2018 when she was transferred back to the ICU after severe desaturation.  Patient could not be weaned.  Therefore due to her complex medical problems she is transferred to [facility] and was admitted here on 10/04/2019.  Pulmonary was consulted and following along.  Chest x-ray done on 10/10/2019 per report diffuse interstitial airspace opacity bilaterally left more than  right suspicious for multifocal pneumonia, pulmonary edema.  CT of the abdomen and pelvis was also done which showed Left lower quadrant ostomy with parastomal hernia containing a portion of the transverse colon and loop of small bowel, without evidence of obstruction or strangulation. New small pleural effusions, left greater than right, with adjacent consolidation/atelectasis in the lung bases left worse than Right.  She has been started on IV vancomycin and cefepime for healthcare associated pneumonia. No respiratory cultures. She denies having any chest pain, fevers, chills, nausea, vomiting, abdominal pain. Patient currently has a trach in place. She is on 40% FiO2.  Review of Systems:  Review of systems negative except as mentioned above in the HPI.   Past Medical History: Past Medical History:  Diagnosis Date   Acute on chronic respiratory failure with hypoxia (HCC)    Acute on chronic respiratory failure with hypoxia (HCC)    CAD (coronary artery disease) 10/20 DES to pRCA, DES mCx    Cancer of lung (HCC)    Twice   Chronic congestive heart failure (HCC)    Chronic HFrEF (heart failure with reduced ejection fraction) (HCC)    Colon cancer (HCC)    COPD (chronic obstructive pulmonary disease) (HCC)    COPD, severe (HCC)    COPD, severe ([place])    Coronary artery disease involving native coronary artery of native heart without angina pectoris    Hyperlipidemia    Hypertension    Ischemic cardiomyopathy    Neuropathy    Secondary to chemo   STEMI (ST elevation myocardial infarction) ([place]) 12/12/2018   Tobacco abuse     Past Surgical History:  Past Surgical History:  Procedure Laterality Date   ABDOMINAL SURGERY     For colon cancer   COLOSTOMY     CORONARY STENT INTERVENTION N/A 12/20/2018   Procedure: CORONARY STENT INTERVENTION - Right;  Surgeon: Burnell Blanks, MD;  Location: [place] CV [facility];  Service: Cardiovascular;  Laterality: N/A;    CORONARY/GRAFT ACUTE MI REVASCULARIZATION N/A 12/12/2018   Procedure: Coronary/Graft Acute MI Revascularization;  Surgeon: Belva Crome, MD;  Location: [place] [facility];  Service: Cardiovascular;  Laterality: N/A;   LEFT HEART CATH AND CORONARY ANGIOGRAPHY N/A 12/12/2018   Procedure: LEFT HEART CATH AND CORONARY ANGIOGRAPHY;  Surgeon: Belva Crome, MD;  Location: [place] [facility];  Service: Cardiovascular;  Laterality: N/A;   LEFT HEART CATH AND CORONARY ANGIOGRAPHY N/A 08/11/2019   Procedure: LEFT HEART CATH AND CORONARY ANGIOGRAPHY;  Surgeon: Burnell Blanks, MD;  Location: [place] [facility];  Service: Cardiovascular;  Laterality: N/A;     Allergies:   Allergies  Allergen Reactions   Statins Other (See Comments)   Hydrochlorothiazide Other (See Comments)    headaches   Hydrocodone Nausea And Vomiting   Lisinopril Other (See Comments)    Headache    Oxycodone Nausea Only     Social History:  reports that she has been smoking cigarettes. She has never used smokeless tobacco. She reports that she does not drink alcohol and does not use drugs.   Family History: Family History  Problem Relation Age of Onset   Hypertension Mother    Hypertension Father    Physical Exam: Vitals: Temperature 97.8, pulse 74, respiratory rate 24, blood pressure 103/52, pulse oximetry 100% on 40% FiO2. Constitutional:Alert and awake, not in any acute distress. Head: Atraumatic, normocephalic Eyes: PERLA, EOMI ENMT: external ears and nose appear normal, normal hearing, Lips appears normal, moist oral mucosa.   Neck: has trach in place  CVS: S1-S2 Respiratory: Rhonchi, no wheezing normal. No accessory muscle use.  Abdomen: soft nontender, nondistended, normal bowel sounds, ostomy in place Msculoskeletal: No edema Neuro: Cranial nerves II-XII intact, strength, sensation, reflexes Psych: judgement and insight appear normal, stable mood and affect Skin: no rashes  Data  reviewed:  I have personally reviewed following labs and imaging studies Labs:  CBC: Recent Labs  Lab 10/11/19 0725  WBC 7.0  HGB 8.9*  HCT 31.1*  MCV 94.8  PLT 951    Basic Metabolic Panel: Recent Labs  Lab 10/11/19 0725  NA 142  K 3.7  CL 103  CO2 28  GLUCOSE 134*  BUN 47*  CREATININE 0.74  CALCIUM 9.5   GFR CrCl cannot be calculated (Unknown ideal weight.). Liver Function Tests: No results for input(s): AST, ALT, ALKPHOS, BILITOT, PROT, ALBUMIN in the last 168 hours. No results for input(s): LIPASE, AMYLASE in the last 168 hours. No results for input(s): AMMONIA in the last 168 hours. Coagulation profile No results for input(s): INR, PROTIME in the last 168 hours.  Cardiac Enzymes: No results for input(s): CKTOTAL, CKMB, CKMBINDEX, TROPONINI in the last 168 hours. BNP: Invalid input(s): POCBNP CBG: No results for input(s): GLUCAP in the last 168 hours. D-Dimer No results for input(s): DDIMER in the last 72 hours. Hgb A1c No results for input(s): HGBA1C in the last 72 hours. Lipid Profile No results for input(s): CHOL, HDL, LDLCALC, TRIG, CHOLHDL, LDLDIRECT in the last 72 hours. Thyroid function studies No results for input(s): TSH, T4TOTAL, T3FREE, THYROIDAB in the last 72 hours.  Invalid input(s): FREET3 Anemia  work up No results for input(s): VITAMINB12, FOLATE, FERRITIN, TIBC, IRON, RETICCTPCT in the last 72 hours. Urinalysis    Component Value Date/Time   COLORURINE STRAW (A) 09/05/2019 1622   APPEARANCEUR CLEAR 09/05/2019 1622   LABSPEC 1.008 09/05/2019 1622   PHURINE 5.0 09/05/2019 1622   GLUCOSEU NEGATIVE 09/05/2019 1622   HGBUR SMALL (A) 09/05/2019 1622   BILIRUBINUR NEGATIVE 09/05/2019 [place] 09/05/2019 1622   PROTEINUR NEGATIVE 09/05/2019 1622   NITRITE NEGATIVE 09/05/2019 1622   LEUKOCYTESUR NEGATIVE 09/05/2019 1622     Microbiology No results found for this or any previous visit (from the past 240  hour(s)).  Inpatient Medications:   Please see MAR   Radiological Exams on Admission: No results found.  Impression/Recommendations Active Problems:   Acute on chronic respiratory failure with hypoxia (HCC) Pneumonia with Klebsiella, E. coli   COPD, severe (HCC)   Chronic HFrEF (heart failure with reduced ejection fraction) (HCC)   Coronary artery disease involving native coronary artery of native heart without angina pectoris Lung cancer status post resection Rectal cancer status post colostomy in 2011 Dysphagia/protein calorie malnutrition  Acute on chronic respiratory failure with hypoxemia: Continues to be ventilator dependent.  Patient had a chest x-ray on 10/10/2019 which showed diffuse interstitial airspace opacity left greater than right suspicious for multifocal pneumonia.  She also had some pulmonary edema.  Currently started on IV vancomycin, cefepime.  Plan to treat for duration of 1 week pending improvement.  Please monitor BUN/trending closely while on antibiotics. If her respiratory status is worsening would recommend to send for respiratory cultures and also repeat chest imaging preferably chest CT to better evaluate.  Pneumonia: She had respiratory cultures done while she was admitted at [facility] earlier which showed Klebsiella, E. coli.  She was already treated with antibiotic treatment with IV vancomycin, Zosyn, ceftriaxone, subsequently with Ancef.  Now chest imaging showing findings concerning for multifocal pneumonia.  On antibiotics as mentioned above.  If her respiratory status is not improving would recommend to send for repeat respiratory cultures and also repeat chest imaging preferably chest CT to better evaluate.  COPD: Patient has COPD.  Continue medication and management per pulmonary and the primary team.  Chronic heart failure: Per records likely systolic heart failure.  Continue medications and management per the primary team.  Coronary artery disease: Continue  medication and management per the primary team.  Lung cancer status post resection/rectal cancer status post colostomy in 2011: Continue to monitor.  Continue supportive management.  Dysphagia/protein calorie malnutrition: Patient continues on tube feeds per NG tube.  She is scheduled for PEG tube placement on 10/17/2019.  Due to her dysphagia she is at risk for aspiration and worsening respiratory failure secondary to aspiration pneumonia.   Thank you for this consultation.    Yaakov Guthrie M.D. 10/13/2019, 5:18 PM

## 2019-10-14 LAB — CBC
HCT: 31.4 % — ABNORMAL LOW (ref 36.0–46.0)
Hemoglobin: 9.2 g/dL — ABNORMAL LOW (ref 12.0–15.0)
MCH: 27.1 pg (ref 26.0–34.0)
MCHC: 29.3 g/dL — ABNORMAL LOW (ref 30.0–36.0)
MCV: 92.6 fL (ref 80.0–100.0)
Platelets: 272 10*3/uL (ref 150–400)
RBC: 3.39 MIL/uL — ABNORMAL LOW (ref 3.87–5.11)
RDW: 17.1 % — ABNORMAL HIGH (ref 11.5–15.5)
WBC: 6.5 10*3/uL (ref 4.0–10.5)
nRBC: 0 % (ref 0.0–0.2)

## 2019-10-14 LAB — BASIC METABOLIC PANEL
Anion gap: 11 (ref 5–15)
BUN: 31 mg/dL — ABNORMAL HIGH (ref 8–23)
CO2: 27 mmol/L (ref 22–32)
Calcium: 9.5 mg/dL (ref 8.9–10.3)
Chloride: 101 mmol/L (ref 98–111)
Creatinine, Ser: 0.56 mg/dL (ref 0.44–1.00)
GFR calc Af Amer: >60 mL/min (ref >60)
GFR calc non Af Amer: >60 mL/min (ref >60)
Glucose, Bld: 115 mg/dL — ABNORMAL HIGH (ref 70–99)
Potassium: 3.4 mmol/L — ABNORMAL LOW (ref 3.5–5.1)
Sodium: 139 mmol/L (ref 135–145)

## 2019-10-14 LAB — MAGNESIUM: Magnesium: 2.3 mg/dL (ref 1.7–2.4)

## 2019-10-14 NOTE — Progress Notes (Signed)
Pulmonary Critical Care Medicine [place]   PULMONARY CRITICAL CARE SERVICE  PROGRESS NOTE  Date of Service: 10/14/2019  Deysi Soldo  [id number]  DOB: 01-Jan-1956   DOA: 10/04/2019  Referring Physician: Merton Border, MD  HPI: Colleena Kurtenbach is a 64 y.o. female seen for follow up of Acute on Chronic Respiratory Failure.  Patient currently is on T collar has been on goal of 4 hours  Medications: Reviewed on Rounds  Physical Exam:  Vitals: Temperature 96.9 pulse 63 respiratory rate is 24 blood pressure is 95/52 saturations 100%  Ventilator Settings off the ventilator on T collar  . General: Comfortable at this time . Eyes: Grossly normal lids, irises & conjunctiva . ENT: grossly tongue is normal . Neck: no obvious mass . Cardiovascular: S1 S2 normal no gallop . Respiratory: No rhonchi very coarse breath sounds . Abdomen: soft . Skin: no rash seen on limited exam . Musculoskeletal: not rigid . Psychiatric:unable to assess . Neurologic: no seizure no involuntary movements         Lab Data:   Basic Metabolic Panel: Recent Labs  Lab 10/11/19 0725 10/14/19 0722  NA 142 139  K 3.7 3.4*  CL 103 101  CO2 28 27  GLUCOSE 134* 115*  BUN 47* 31*  CREATININE 0.74 0.56  CALCIUM 9.5 9.5  MG  --  2.3    ABG: No results for input(s): PHART, PCO2ART, PO2ART, HCO3, O2SAT in the last 168 hours.  Liver Function Tests: No results for input(s): AST, ALT, ALKPHOS, BILITOT, PROT, ALBUMIN in the last 168 hours. No results for input(s): LIPASE, AMYLASE in the last 168 hours. No results for input(s): AMMONIA in the last 168 hours.  CBC: Recent Labs  Lab 10/11/19 0725 10/14/19 0722  WBC 7.0 6.5  HGB 8.9* 9.2*  HCT 31.1* 31.4*  MCV 94.8 92.6  PLT 298 272    Cardiac Enzymes: No results for input(s): CKTOTAL, CKMB, CKMBINDEX, TROPONINI in the last 168 hours.  BNP (last 3 results) Recent Labs    08/13/19 0225 09/05/19 0735 10/10/19 1120  BNP 929.4*  2,477.5* 973.2*    ProBNP (last 3 results) No results for input(s): PROBNP in the last 8760 hours.  Radiological Exams: No results found.  Assessment/Plan Active Problems:   Acute on chronic respiratory failure with hypoxia (HCC)   Coronary artery disease involving native coronary artery of native heart without angina pectoris   COPD, severe (HCC)   Chronic HFrEF (heart failure with reduced ejection fraction) ([place])   1. Acute on chronic respiratory failure hypoxia we will continue with advancing diet as tolerated.  She has very tenuous as far as tolerating the T collar. 2. Coronary artery disease at baseline last EF was about 45% 3. Severe COPD at baseline medical management 4. Chronic heart failure reduced ejection fraction optimize cardiac stent   I have personally seen and evaluated the patient, evaluated laboratory and imaging results, formulated the assessment and plan and placed orders. The Patient requires high complexity decision making with multiple systems involvement.  Rounds were done with the Respiratory Therapy Director and Staff therapists and discussed with nursing staff also.  Allyne Gee, MD [facility] Pulmonary Critical Care Medicine Sleep Medicine

## 2019-10-16 LAB — CBC
HCT: 32.6 % — ABNORMAL LOW (ref 36.0–46.0)
Hemoglobin: 9.7 g/dL — ABNORMAL LOW (ref 12.0–15.0)
MCH: 27.9 pg (ref 26.0–34.0)
MCHC: 29.8 g/dL — ABNORMAL LOW (ref 30.0–36.0)
MCV: 93.7 fL (ref 80.0–100.0)
Platelets: 287 10*3/uL (ref 150–400)
RBC: 3.48 MIL/uL — ABNORMAL LOW (ref 3.87–5.11)
RDW: 17 % — ABNORMAL HIGH (ref 11.5–15.5)
WBC: 6.2 10*3/uL (ref 4.0–10.5)
nRBC: 0 % (ref 0.0–0.2)

## 2019-10-16 LAB — BASIC METABOLIC PANEL
Anion gap: 8 (ref 5–15)
BUN: 29 mg/dL — ABNORMAL HIGH (ref 8–23)
CO2: 26 mmol/L (ref 22–32)
Calcium: 9.6 mg/dL (ref 8.9–10.3)
Chloride: 103 mmol/L (ref 98–111)
Creatinine, Ser: 0.62 mg/dL (ref 0.44–1.00)
GFR calc Af Amer: >60 mL/min (ref >60)
GFR calc non Af Amer: >60 mL/min (ref >60)
Glucose, Bld: 102 mg/dL — ABNORMAL HIGH (ref 70–99)
Potassium: 3.9 mmol/L (ref 3.5–5.1)
Sodium: 137 mmol/L (ref 135–145)

## 2019-10-16 LAB — VANCOMYCIN, TROUGH: Vancomycin Tr: 20 ug/mL (ref 15–20)

## 2019-10-16 NOTE — Progress Notes (Signed)
Pulmonary Critical Care Medicine [place]   PULMONARY CRITICAL CARE SERVICE  PROGRESS NOTE  Date of Service: 10/16/2019  Chelsea Allison  [id number]  DOB: 1956/01/24   DOA: 10/04/2019  Referring Physician: Merton Border, MD  HPI: Chelsea Allison is a 64 y.o. female seen for follow up of Acute on Chronic Respiratory Failure.  Patient was attempted on T collar today did about 2 hours and then basically went into increased work of breathing with frothy sputum.  Patient has not had a recent echocardiogram done  Medications: Reviewed on Rounds  Physical Exam:  Vitals: Temperature is 98.2 pulse 68 respiratory 24 blood pressure 98/55 saturations 100%  Ventilator Settings on full support on the ventilator and assist control  . General: Comfortable at this time . Eyes: Grossly normal lids, irises & conjunctiva . ENT: grossly tongue is normal . Neck: no obvious mass . Cardiovascular: S1 S2 normal no gallop . Respiratory: No rhonchi very coarse breath sounds . Abdomen: soft . Skin: no rash seen on limited exam . Musculoskeletal: not rigid . Psychiatric:unable to assess . Neurologic: no seizure no involuntary movements         Lab Data:   Basic Metabolic Panel: Recent Labs  Lab 10/11/19 0725 10/14/19 0722 10/16/19 0626  NA 142 139 137  K 3.7 3.4* 3.9  CL 103 101 103  CO2 28 27 26   GLUCOSE 134* 115* 102*  BUN 47* 31* 29*  CREATININE 0.74 0.56 0.62  CALCIUM 9.5 9.5 9.6  MG  --  2.3  --     ABG: No results for input(s): PHART, PCO2ART, PO2ART, HCO3, O2SAT in the last 168 hours.  Liver Function Tests: No results for input(s): AST, ALT, ALKPHOS, BILITOT, PROT, ALBUMIN in the last 168 hours. No results for input(s): LIPASE, AMYLASE in the last 168 hours. No results for input(s): AMMONIA in the last 168 hours.  CBC: Recent Labs  Lab 10/11/19 0725 10/14/19 0722 10/16/19 0626  WBC 7.0 6.5 6.2  HGB 8.9* 9.2* 9.7*  HCT 31.1* 31.4* 32.6*  MCV 94.8 92.6  93.7  PLT 298 272 287    Cardiac Enzymes: No results for input(s): CKTOTAL, CKMB, CKMBINDEX, TROPONINI in the last 168 hours.  BNP (last 3 results) Recent Labs    08/13/19 0225 09/05/19 0735 10/10/19 1120  BNP 929.4* 2,477.5* 973.2*    ProBNP (last 3 results) No results for input(s): PROBNP in the last 8760 hours.  Radiological Exams: No results found.  Assessment/Plan Active Problems:   Acute on chronic respiratory failure with hypoxia (HCC)   Coronary artery disease involving native coronary artery of native heart without angina pectoris   COPD, severe (HCC)   Chronic HFrEF (heart failure with reduced ejection fraction) ([place])   1. Acute on chronic respiratory failure hypoxia plan is to continue with the T collar trials as tolerated continue secretion management supportive care. 2. Coronary artery disease at baseline we will continue to monitor 3. Severe COPD medical management 4. Chronic heart failure reduced ejection fraction follow-up echocardiogram was ordered   I have personally seen and evaluated the patient, evaluated laboratory and imaging results, formulated the assessment and plan and placed orders. The Patient requires high complexity decision making with multiple systems involvement.  Rounds were done with the Respiratory Therapy Director and Staff therapists and discussed with nursing staff also.  Allyne Gee, MD [facility] Pulmonary Critical Care Medicine Sleep Medicine

## 2019-10-17 ENCOUNTER — Other Ambulatory Visit (HOSPITAL_COMMUNITY): Payer: Medicare HMO

## 2019-10-17 LAB — ECHOCARDIOGRAM COMPLETE
Area-P 1/2: 4.15 cm2
S' Lateral: 4 cm

## 2019-10-17 NOTE — Progress Notes (Signed)
  Echocardiogram 2D Echocardiogram has been performed.  Chelsea Allison G Burnard Enis 10/17/2019, 1:39 PM

## 2019-10-17 NOTE — Progress Notes (Signed)
   Planned for G tube in IR at some point. Pt is refusing to give consent to IR APP; until swallow study and evaluation is performed. Wants to avoid G tube if can  RN states pt having difficulty weaning from vent  We will keep on IR Radar

## 2019-10-17 NOTE — Progress Notes (Signed)
Pulmonary Critical Care Medicine [place]   PULMONARY CRITICAL CARE SERVICE  PROGRESS NOTE  Date of Service: 10/17/2019  Nayelie Gionfriddo  [id number]:[id number]  DOB: 1956/02/01   DOA: 10/04/2019  Referring Physician: Merton Border, MD  HPI: Janeli Lewison is a 64 y.o. female seen for follow up of Acute on Chronic Respiratory Failure.  Patient is on full support on assist control mode right now on 40% FiO2 currently on a PEEP of 5  Medications: Reviewed on Rounds  Physical Exam:  Vitals: Temperature is 96.5 pulse 72 respiratory rate 24 blood pressure is 99/56 saturations 98%  Ventilator Settings on assist control FiO2 40% tidal volume is 599 PEEP 5  . General: Comfortable at this time . Eyes: Grossly normal lids, irises & conjunctiva . ENT: grossly tongue is normal . Neck: no obvious mass . Cardiovascular: S1 S2 normal no gallop . Respiratory: No rhonchi no rales are noted at this time . Abdomen: soft . Skin: no rash seen on limited exam . Musculoskeletal: not rigid . Psychiatric:unable to assess . Neurologic: no seizure no involuntary movements         Lab Data:   Basic Metabolic Panel: Recent Labs  Lab 10/11/19 0725 10/14/19 0722 10/16/19 0626  NA 142 139 137  K 3.7 3.4* 3.9  CL 103 101 103  CO2 28 27 26   GLUCOSE 134* 115* 102*  BUN 47* 31* 29*  CREATININE 0.74 0.56 0.62  CALCIUM 9.5 9.5 9.6  MG  --  2.3  --     ABG: No results for input(s): PHART, PCO2ART, PO2ART, HCO3, O2SAT in the last 168 hours.  Liver Function Tests: No results for input(s): AST, ALT, ALKPHOS, BILITOT, PROT, ALBUMIN in the last 168 hours. No results for input(s): LIPASE, AMYLASE in the last 168 hours. No results for input(s): AMMONIA in the last 168 hours.  CBC: Recent Labs  Lab 10/11/19 0725 10/14/19 0722 10/16/19 0626  WBC 7.0 6.5 6.2  HGB 8.9* 9.2* 9.7*  HCT 31.1* 31.4* 32.6*  MCV 94.8 92.6 93.7  PLT 298 272 287    Cardiac Enzymes: No results for input(s):  CKTOTAL, CKMB, CKMBINDEX, TROPONINI in the last 168 hours.  BNP (last 3 results) Recent Labs    08/13/19 0225 09/05/19 0735 10/10/19 1120  BNP 929.4* 2,477.5* 973.2*    ProBNP (last 3 results) No results for input(s): PROBNP in the last 8760 hours.  Radiological Exams: No results found.  Assessment/Plan Active Problems:   Acute on chronic respiratory failure with hypoxia (HCC)   Coronary artery disease involving native coronary artery of native heart without angina pectoris   COPD, severe (HCC)   Chronic HFrEF (heart failure with reduced ejection fraction) ([place])   1. Acute on chronic respiratory failure with hypoxia I had a very lengthy discussion with the patient at bedside explained to her that she needs to let the respiratory therapist do the weaning attempts.  She has a great deal of anxiety which I explained to her that I do understand that we can do anything possible to help her cope with the anxiety and also explained to her that we are continuously monitoring her sooner if any untoward events occur will be quick to respond. 2. Coronary artery disease supportive care echocardiogram has been ordered 3. Severe COPD no change medical management 4. Chronic heart failure reduced ejection fraction awaiting echocardiogram to make certain there is no physiologic reason for her failure to wean   I have personally seen and evaluated  the patient, evaluated laboratory and imaging results, formulated the assessment and plan and placed orders. The Patient requires high complexity decision making with multiple systems involvement.  Rounds were done with the Respiratory Therapy Director and Staff therapists and discussed with nursing staff also.  Allyne Gee, MD [facility] Pulmonary Critical Care Medicine Sleep Medicine

## 2019-10-18 LAB — BASIC METABOLIC PANEL
Anion gap: 11 (ref 5–15)
BUN: 31 mg/dL — ABNORMAL HIGH (ref 8–23)
CO2: 25 mmol/L (ref 22–32)
Calcium: 9.8 mg/dL (ref 8.9–10.3)
Chloride: 101 mmol/L (ref 98–111)
Creatinine, Ser: 0.67 mg/dL (ref 0.44–1.00)
GFR calc Af Amer: >60 mL/min (ref >60)
GFR calc non Af Amer: >60 mL/min (ref >60)
Glucose, Bld: 104 mg/dL — ABNORMAL HIGH (ref 70–99)
Potassium: 3.5 mmol/L (ref 3.5–5.1)
Sodium: 137 mmol/L (ref 135–145)

## 2019-10-18 LAB — CBC
HCT: 30.7 % — ABNORMAL LOW (ref 36.0–46.0)
Hemoglobin: 9 g/dL — ABNORMAL LOW (ref 12.0–15.0)
MCH: 27 pg (ref 26.0–34.0)
MCHC: 29.3 g/dL — ABNORMAL LOW (ref 30.0–36.0)
MCV: 92.2 fL (ref 80.0–100.0)
Platelets: 270 10*3/uL (ref 150–400)
RBC: 3.33 MIL/uL — ABNORMAL LOW (ref 3.87–5.11)
RDW: 16.5 % — ABNORMAL HIGH (ref 11.5–15.5)
WBC: 6.8 10*3/uL (ref 4.0–10.5)
nRBC: 0 % (ref 0.0–0.2)

## 2019-10-18 LAB — PROTIME-INR
INR: 1.2 (ref 0.8–1.2)
Prothrombin Time: 14.3 seconds (ref 11.4–15.2)

## 2019-10-18 LAB — MAGNESIUM: Magnesium: 2 mg/dL (ref 1.7–2.4)

## 2019-10-18 NOTE — Progress Notes (Addendum)
Pulmonary Critical Care Medicine [place]   PULMONARY CRITICAL CARE SERVICE  PROGRESS NOTE  Date of Service: 10/18/2019  Chelsea Allison  YQM:[id number]  DOB: 08-Mar-1956   DOA: 10/04/2019  Referring Physician: Merton Border, MD  HPI: Chelsea Allison is a 64 y.o. female seen for follow up of Acute on Chronic Respiratory Failure.  Patient mains on aerosol trach collar 40% FiO2 for 12-hour goal today currently satting well no distress. Medications: Reviewed on Rounds  Physical Exam:  Vitals: Pulse 68 respiration 24 BP 95/53 O2 sat 100% temp 96.7  Ventilator Settings 40% ATC   . General: Comfortable at this time . Eyes: Grossly normal lids, irises & conjunctiva . ENT: grossly tongue is normal . Neck: no obvious mass . Cardiovascular: S1 S2 normal no gallop . Respiratory: No rales or rhonchi noted . Abdomen: soft . Skin: no rash seen on limited exam . Musculoskeletal: not rigid . Psychiatric:unable to assess . Neurologic: no seizure no involuntary movements         Lab Data:   Basic Metabolic Panel: Recent Labs  Lab 10/14/19 0722 10/16/19 0626 10/18/19 0720  NA 139 137 137  K 3.4* 3.9 3.5  CL 101 103 101  CO2 27 26 25   GLUCOSE 115* 102* 104*  BUN 31* 29* 31*  CREATININE 0.56 0.62 0.67  CALCIUM 9.5 9.6 9.8  MG 2.3  --  2.0    ABG: No results for input(s): PHART, PCO2ART, PO2ART, HCO3, O2SAT in the last 168 hours.  Liver Function Tests: No results for input(s): AST, ALT, ALKPHOS, BILITOT, PROT, ALBUMIN in the last 168 hours. No results for input(s): LIPASE, AMYLASE in the last 168 hours. No results for input(s): AMMONIA in the last 168 hours.  CBC: Recent Labs  Lab 10/14/19 0722 10/16/19 0626 10/18/19 0720  WBC 6.5 6.2 6.8  HGB 9.2* 9.7* 9.0*  HCT 31.4* 32.6* 30.7*  MCV 92.6 93.7 92.2  PLT 272 287 270    Cardiac Enzymes: No results for input(s): CKTOTAL, CKMB, CKMBINDEX, TROPONINI in the last 168 hours.  BNP (last 3 results) Recent  Labs    08/13/19 0225 09/05/19 0735 10/10/19 1120  BNP 929.4* 2,477.5* 973.2*    ProBNP (last 3 results) No results for input(s): PROBNP in the last 8760 hours.  Radiological Exams: ECHOCARDIOGRAM COMPLETE  Result Date: 10/17/2019    ECHOCARDIOGRAM REPORT   Patient Name:   Chelsea Allison Date of Exam: 10/17/2019 Medical Rec #:  [id number]    Height:       69.0 in Accession #:    [id number]   Weight:       182.3 lb Date of Birth:  April 14, 1955    BSA:          1.986 m Patient Age:    29 years     BP:           104/58 mmHg Patient Gender: F            HR:           77 bpm. Exam Location:  Inpatient Procedure: 2D Echo, Cardiac Doppler and Color Doppler Indications:     ; J17.91 Chronic systolic (congestive) heart failure  History:         Patient has prior history of Echocardiogram examinations, most                  recent 08/12/2019. Cardiomyopathy, Previous Myocardial Infarction  and CAD; Risk Factors:Former Smoker, Dyslipidemia and                  Hypertension.  Sonographer:     Jonelle Sidle Dance Referring Phys:  [id number] ALI HIJAZI Diagnosing Phys: Dixie Dials MD  Sonographer Comments: Echo performed with patient supine and on artificial respirator. IMPRESSIONS  1. Left ventricular ejection fraction, by estimation, is 35 to 40%. The left ventricle has moderately decreased function. The left ventricle demonstrates global hypokinesis. There is mild asymmetric left ventricular hypertrophy of the posterior segment.  Left ventricular diastolic parameters are consistent with Grade I diastolic dysfunction (impaired relaxation).  2. Right ventricular systolic function is normal. The right ventricular size is normal.  3. Left atrial size was mildly dilated.  4. The pericardial effusion is circumferential. There is no evidence of cardiac tamponade. Large pleural effusion in the left lateral region.  5. The mitral valve is degenerative. Mild mitral valve regurgitation.  6. The aortic valve is tricuspid.  Aortic valve regurgitation is not visualized. Mild aortic valve sclerosis is present, with no evidence of aortic valve stenosis.  7. There is mild (Grade II) atheroma plaque involving the ascending aorta and aortic root.  8. The inferior vena cava is dilated in size with <50% respiratory variability, suggesting right atrial pressure of 15 mmHg. FINDINGS  Left Ventricle: Left ventricular ejection fraction, by estimation, is 35 to 40%. The left ventricle has moderately decreased function. The left ventricle demonstrates global hypokinesis. The left ventricular internal cavity size was normal in size. There is mild asymmetric left ventricular hypertrophy of the posterior segment. Left ventricular diastolic parameters are consistent with Grade I diastolic dysfunction (impaired relaxation).  LV Wall Scoring: The entire anterior wall, entire lateral wall, entire apex, and entire inferior wall are hypokinetic. The anterior septum, mid inferoseptal segment, and basal inferoseptal segment are normal. Right Ventricle: The right ventricular size is normal. No increase in right ventricular wall thickness. Right ventricular systolic function is normal. Left Atrium: Left atrial size was mildly dilated. Right Atrium: Right atrial size was normal in size. Pericardium: Trivial pericardial effusion is present. The pericardial effusion is circumferential. There is no evidence of cardiac tamponade. Mitral Valve: The mitral valve is degenerative in appearance. There is mild thickening of the mitral valve leaflet(s). There is moderate calcification of the posterior mitral valve leaflet(s). Mildly decreased mobility of the mitral valve leaflets. Moderate to severe mitral annular calcification. Mild mitral valve regurgitation. Tricuspid Valve: The tricuspid valve is normal in structure. Tricuspid valve regurgitation is mild. Aortic Valve: The aortic valve is tricuspid. . There is moderate thickening and moderate calcification of the aortic  valve. Aortic valve regurgitation is not visualized. Mild aortic valve sclerosis is present, with no evidence of aortic valve stenosis. Mild aortic valve annular calcification. There is moderate thickening of the aortic valve. There is moderate calcification of the aortic valve. Pulmonic Valve: The pulmonic valve was normal in structure. Pulmonic valve regurgitation is not visualized. Aorta: The aortic root is normal in size and structure. There is mild (Grade II) atheroma plaque involving the ascending aorta and aortic root. Venous: The inferior vena cava is dilated in size with less than 50% respiratory variability, suggesting right atrial pressure of 15 mmHg. IAS/Shunts: The interatrial septum was not assessed. Additional Comments: There is a large pleural effusion in the left lateral region.  LEFT VENTRICLE PLAX 2D LVIDd:         5.10 cm  Diastology LVIDs:  4.00 cm  LV e' lateral:   8.05 cm/s LV PW:         1.40 cm  LV E/e' lateral: 11.8 LV IVS:        0.90 cm  LV e' medial:    4.68 cm/s LVOT diam:     1.90 cm  LV E/e' medial:  20.3 LV SV:         51 LV SV Index:   26 LVOT Area:     2.84 cm  RIGHT VENTRICLE             IVC RV S prime:     10.30 cm/s  IVC diam: 2.30 cm TAPSE (M-mode): 1.5 cm LEFT ATRIUM           Index       RIGHT ATRIUM           Index LA diam:      3.90 cm 1.96 cm/m  RA Area:     12.70 cm LA Vol (A2C): 73.3 ml 36.90 ml/m RA Volume:   34.40 ml  17.32 ml/m LA Vol (A4C): 40.6 ml 20.44 ml/m  AORTIC VALVE LVOT Vmax:   85.75 cm/s LVOT Vmean:  60.250 cm/s LVOT VTI:    0.180 m  AORTA Ao Root diam: 3.30 cm Ao Asc diam:  2.10 cm MITRAL VALVE MV Area (PHT): 4.15 cm    SHUNTS MV Decel Time: 183 msec    Systemic VTI:  0.18 m MV E velocity: 95.10 cm/s  Systemic Diam: 1.90 cm MV A velocity: 85.70 cm/s MV E/A ratio:  1.11 Dixie Dials MD Electronically signed by Dixie Dials MD Signature Date/Time: 10/17/2019/2:46:20 PM    Final     Assessment/Plan Active Problems:   Acute on chronic  respiratory failure with hypoxia (HCC)   Coronary artery disease involving native coronary artery of native heart without angina pectoris   COPD, severe (HCC)   Chronic HFrEF (heart failure with reduced ejection fraction) ([place])   1. Acute on chronic respiratory failure with hypoxia patient is currently doing well with the wean current goal is 12 hours.  On aerosol trach collar 40%.  We will continue supportive measures and pulmonary toilet.  Continue to attempt weaning as tolerated. 2. Coronary artery disease supportive care echocardiogram has been ordered 3. Severe COPD no change medical management 4. Chronic heart failure reduced ejection fraction awaiting echocardiogram to make certain there is no physiologic reason for her failure to wean   I have personally seen and evaluated the patient, evaluated laboratory and imaging results, formulated the assessment and plan and placed orders. The Patient requires high complexity decision making with multiple systems involvement.  Rounds were done with the Respiratory Therapy Director and Staff therapists and discussed with nursing staff also.  Allyne Gee, MD [facility] Pulmonary Critical Care Medicine Sleep Medicine

## 2019-10-18 NOTE — Progress Notes (Addendum)
    Scheduled for perc G tube in IR today Off Brilinta over 5 days Off Lovenox x 2 days Afeb; labs good Admitted to [facility] 6/29 Admitted to [facility] 7/28 Covid neg 08/24/19 and 09/05/19  Vent; trach Cannot wean Not candidate for swallow test Pt is consenting to procedure  Risks and benefits image guided gastrostomy tube placement was discussed with the patient including, but not limited to the need for a barium enema during the procedure, bleeding, infection, peritonitis and/or damage to adjacent structures.  All of the patient's questions were answered, patient is agreeable to proceed.  Consent signed and in chart.

## 2019-10-18 NOTE — Progress Notes (Signed)
IR.  Patient was scheduled for an image-guided percutaneous gastrostomy tube placement, tentatively for today in IR. Unfortunately, unable to accommodate procedure today due to IR scheduling- will plan for procedure tomorrow in IR. Patient to be NPO at midnight (hold tube feeds as well). Continue to hold Lovenox/Brilinta until post-procedure. RN aware.  Please call IR with questions/concerns.   Bea Graff Aquila Delaughter, PA-C 10/18/2019, 3:45 PM

## 2019-10-19 LAB — CBC
HCT: 35.7 % — ABNORMAL LOW (ref 36.0–46.0)
Hemoglobin: 10.5 g/dL — ABNORMAL LOW (ref 12.0–15.0)
MCH: 26.8 pg (ref 26.0–34.0)
MCHC: 29.4 g/dL — ABNORMAL LOW (ref 30.0–36.0)
MCV: 91.1 fL (ref 80.0–100.0)
Platelets: 302 10*3/uL (ref 150–400)
RBC: 3.92 MIL/uL (ref 3.87–5.11)
RDW: 15.9 % — ABNORMAL HIGH (ref 11.5–15.5)
WBC: 6.3 10*3/uL (ref 4.0–10.5)
nRBC: 0 % (ref 0.0–0.2)

## 2019-10-19 LAB — BASIC METABOLIC PANEL
Anion gap: 11 (ref 5–15)
BUN: 28 mg/dL — ABNORMAL HIGH (ref 8–23)
CO2: 27 mmol/L (ref 22–32)
Calcium: 10.1 mg/dL (ref 8.9–10.3)
Chloride: 101 mmol/L (ref 98–111)
Creatinine, Ser: 0.79 mg/dL (ref 0.44–1.00)
GFR calc Af Amer: >60 mL/min (ref >60)
GFR calc non Af Amer: >60 mL/min (ref >60)
Glucose, Bld: 107 mg/dL — ABNORMAL HIGH (ref 70–99)
Potassium: 3.6 mmol/L (ref 3.5–5.1)
Sodium: 139 mmol/L (ref 135–145)

## 2019-10-19 NOTE — Progress Notes (Signed)
Pulmonary Critical Care Medicine [place]   PULMONARY CRITICAL CARE SERVICE  PROGRESS NOTE  Date of Service: 10/19/2019  Chelsea Allison  GNO:[id number]  DOB: 10/23/1955   DOA: 10/04/2019  Referring Physician: Merton Border, MD  HPI: Chelsea Allison is a 64 y.o. female seen for follow up of Acute on Chronic Respiratory Failure.  Patient is on T collar on 40% FiO2 the goal today is for 12 hours  Medications: Reviewed on Rounds  Physical Exam:  Vitals: Temperature 96.9 pulse 83 respiratory 34 blood pressure is 114/67 saturations 100%  Ventilator Settings on T collar FiO2 40%  . General: Comfortable at this time . Eyes: Grossly normal lids, irises & conjunctiva . ENT: grossly tongue is normal . Neck: no obvious mass . Cardiovascular: S1 S2 normal no gallop . Respiratory: No rhonchi no rales are noted at this time . Abdomen: soft . Skin: no rash seen on limited exam . Musculoskeletal: not rigid . Psychiatric:unable to assess . Neurologic: no seizure no involuntary movements         Lab Data:   Basic Metabolic Panel: Recent Labs  Lab 10/14/19 0722 10/16/19 0626 10/18/19 0720 10/19/19 0502  NA 139 137 137 139  K 3.4* 3.9 3.5 3.6  CL 101 103 101 101  CO2 27 26 25 27   GLUCOSE 115* 102* 104* 107*  BUN 31* 29* 31* 28*  CREATININE 0.56 0.62 0.67 0.79  CALCIUM 9.5 9.6 9.8 10.1  MG 2.3  --  2.0  --     ABG: No results for input(s): PHART, PCO2ART, PO2ART, HCO3, O2SAT in the last 168 hours.  Liver Function Tests: No results for input(s): AST, ALT, ALKPHOS, BILITOT, PROT, ALBUMIN in the last 168 hours. No results for input(s): LIPASE, AMYLASE in the last 168 hours. No results for input(s): AMMONIA in the last 168 hours.  CBC: Recent Labs  Lab 10/14/19 0722 10/16/19 0626 10/18/19 0720 10/19/19 0502  WBC 6.5 6.2 6.8 6.3  HGB 9.2* 9.7* 9.0* 10.5*  HCT 31.4* 32.6* 30.7* 35.7*  MCV 92.6 93.7 92.2 91.1  PLT 272 287 270 302    Cardiac Enzymes: No  results for input(s): CKTOTAL, CKMB, CKMBINDEX, TROPONINI in the last 168 hours.  BNP (last 3 results) Recent Labs    08/13/19 0225 09/05/19 0735 10/10/19 1120  BNP 929.4* 2,477.5* 973.2*    ProBNP (last 3 results) No results for input(s): PROBNP in the last 8760 hours.  Radiological Exams: No results found.  Assessment/Plan Active Problems:   Acute on chronic respiratory failure with hypoxia (HCC)   Coronary artery disease involving native coronary artery of native heart without angina pectoris   COPD, severe (HCC)   Chronic HFrEF (heart failure with reduced ejection fraction) ([place])   1. Acute on chronic respiratory failure hypoxia continue to wean on T collar goal is for 12 hours.  Continue secretion management supportive care. 2. Coronary artery disease at baseline no active chest pain 3. Severe COPD medical management 4. Chronic heart failure reduced ejection fraction continue with fluid management.   I have personally seen and evaluated the patient, evaluated laboratory and imaging results, formulated the assessment and plan and placed orders. The Patient requires high complexity decision making with multiple systems involvement.  Rounds were done with the Respiratory Therapy Director and Staff therapists and discussed with nursing staff also.  Allyne Gee, MD [facility] Pulmonary Critical Care Medicine Sleep Medicine

## 2019-10-20 ENCOUNTER — Other Ambulatory Visit (HOSPITAL_COMMUNITY): Payer: Medicare HMO

## 2019-10-20 HISTORY — PX: IR GASTROSTOMY TUBE MOD SED: IMG625

## 2019-10-20 MED ORDER — GLUCAGON HCL RDNA (DIAGNOSTIC) 1 MG IJ SOLR
INTRAMUSCULAR | Status: AC
  Administered 2019-10-20: 0.5 mg via INTRAVENOUS
  Filled 2019-10-20: qty 1

## 2019-10-20 MED ORDER — IOHEXOL 300 MG/ML  SOLN
50.0000 mL | Freq: Once | INTRAMUSCULAR | Status: AC | PRN
  Administered 2019-10-20: 20 mL

## 2019-10-20 MED ORDER — CEFAZOLIN (ANCEF) 1 G IV SOLR
INTRAVENOUS | Status: AC | PRN
  Administered 2019-10-20: 1 g

## 2019-10-20 MED ORDER — MIDAZOLAM HCL 2 MG/2ML IJ SOLN
INTRAMUSCULAR | Status: AC
  Filled 2019-10-20: qty 2

## 2019-10-20 MED ORDER — MIDAZOLAM HCL 2 MG/2ML IJ SOLN
INTRAMUSCULAR | Status: AC | PRN
  Administered 2019-10-20: 1 mg via INTRAVENOUS

## 2019-10-20 MED ORDER — FENTANYL CITRATE (PF) 100 MCG/2ML IJ SOLN
INTRAMUSCULAR | Status: AC | PRN
  Administered 2019-10-20: 50 ug via INTRAVENOUS

## 2019-10-20 MED ORDER — LIDOCAINE HCL 1 % IJ SOLN
INTRAMUSCULAR | Status: AC
  Filled 2019-10-20: qty 20

## 2019-10-20 MED ORDER — FENTANYL CITRATE (PF) 100 MCG/2ML IJ SOLN
INTRAMUSCULAR | Status: AC
  Filled 2019-10-20: qty 2

## 2019-10-20 NOTE — Progress Notes (Addendum)
Pulmonary Critical Care Medicine [place]   PULMONARY CRITICAL CARE SERVICE  PROGRESS NOTE  Date of Service: 10/20/2019  Chelsea Allison  KMQ:[id number]  DOB: [birth year]   DOA: 10/04/2019  Referring Physician: Merton Border, MD  HPI: Chelsea Allison is a 64 y.o. female seen for follow up of Acute on Chronic Respiratory Failure.  Patient continues on aerosol trach collar 40% FiO2 for 16-hour goal today currently satting well no fever or distress.  Medications: Reviewed on Rounds  Physical Exam:  Vitals: Pulse 68 respirations 24 BP 102/62 O2 sat 100% temp 96.7  Ventilator Settings ATC 40%  . General: Comfortable at this time . Eyes: Grossly normal lids, irises & conjunctiva . ENT: grossly tongue is normal . Neck: no obvious mass . Cardiovascular: S1 S2 normal no gallop . Respiratory: No rales or rhonchi noted . Abdomen: soft . Skin: no rash seen on limited exam . Musculoskeletal: not rigid . Psychiatric:unable to assess . Neurologic: no seizure no involuntary movements         Lab Data:   Basic Metabolic Panel: Recent Labs  Lab 10/14/19 0722 10/16/19 0626 10/18/19 0720 10/19/19 0502  NA 139 137 137 139  K 3.4* 3.9 3.5 3.6  CL 101 103 101 101  CO2 '27 26 25 27  ' GLUCOSE 115* 102* 104* 107*  BUN 31* 29* 31* 28*  CREATININE 0.56 0.62 0.67 0.79  CALCIUM 9.5 9.6 9.8 10.1  MG 2.3  --  2.0  --     ABG: No results for input(s): PHART, PCO2ART, PO2ART, HCO3, O2SAT in the last 168 hours.  Liver Function Tests: No results for input(s): AST, ALT, ALKPHOS, BILITOT, PROT, ALBUMIN in the last 168 hours. No results for input(s): LIPASE, AMYLASE in the last 168 hours. No results for input(s): AMMONIA in the last 168 hours.  CBC: Recent Labs  Lab 10/14/19 0722 10/16/19 0626 10/18/19 0720 10/19/19 0502  WBC 6.5 6.2 6.8 6.3  HGB 9.2* 9.7* 9.0* 10.5*  HCT 31.4* 32.6* 30.7* 35.7*  MCV 92.6 93.7 92.2 91.1  PLT 272 287 270 302    Cardiac Enzymes: No  results for input(s): CKTOTAL, CKMB, CKMBINDEX, TROPONINI in the last 168 hours.  BNP (last 3 results) Recent Labs    08/13/19 0225 09/05/19 0735 10/10/19 1120  BNP 929.4* 2,477.5* 973.2*    ProBNP (last 3 results) No results for input(s): PROBNP in the last 8760 hours.  Radiological Exams: IR GASTROSTOMY TUBE MOD SED  Result Date: 10/20/2019 CLINICAL DATA:  Acute on chronic respiratory failure and need for percutaneous gastrostomy tube for long-term nutrition. EXAM: PERCUTANEOUS GASTROSTOMY TUBE PLACEMENT ANESTHESIA/SEDATION: 1.0 mg IV Versed; 50 mcg IV Fentanyl. Total Moderate Sedation Time 12 minutes. The patient's level of consciousness and physiologic status were continuously monitored during the procedure by Radiology nursing. CONTRAST:  45m OMNIPAQUE IOHEXOL 300 MG/ML  SOLN MEDICATIONS: 2 g IV Ancef. IV antibiotic was administered in an appropriate time interval prior to needle puncture of the skin. The patient also received 0.5 mg IV glucagon during the procedure. FLUOROSCOPY TIME:  3 minutes and 24 seconds.  16.0 mGy. PROCEDURE: The procedure, risks, benefits, and alternatives were explained to the patient. Questions regarding the procedure were encouraged and answered. The patient understands and consents to the procedure. A time-out was performed prior to initiating the procedure. A 5-French catheter was then advanced through the patient's mouth under fluoroscopy into the esophagus and to the level of the stomach. This catheter was used to insufflate the stomach  with air under fluoroscopy. The abdominal wall was prepped with Betadine in a sterile fashion, and a sterile drape was applied covering the operative field. A sterile gown and sterile gloves were used for the procedure. Local anesthesia was provided with 1% Lidocaine. A skin incision was made in the upper abdominal wall. Under fluoroscopy, an 18 gauge trocar needle was advanced into the stomach. Contrast injection was performed  to confirm intraluminal position of the needle tip. A single T tack was then deployed in the lumen of the stomach. This was brought up to tension at the skin surface. Over a guidewire, a 9-French sheath was advanced into the lumen of the stomach. The wire was left in place as a safety wire. A loop snare device from a percutaneous gastrostomy kit was then advanced into the stomach. A floppy guide wire was advanced through the orogastric catheter under fluoroscopy in the stomach. The loop snare advanced through the percutaneous gastric access was used to snare the guide wire. This allowed withdrawal of the loop snare out of the patient's mouth by retraction of the orogastric catheter and wire. A 20-French bumper retention gastrostomy tube was looped around the snare device. It was then pulled back through the patient's mouth. The retention bumper was brought up to the anterior gastric wall. The T tack suture was cut at the skin. The exiting gastrostomy tube was cut to appropriate length and a feeding adapter applied. The catheter was injected with contrast material to confirm position and a fluoroscopic spot image saved. The tube was then flushed with saline. A dressing was applied over the gastrostomy exit site. COMPLICATIONS: None. FINDINGS: The stomach distended well with air allowing safe placement of the gastrostomy tube. After placement, the tip of the gastrostomy tube lies in the body of the stomach. IMPRESSION: Percutaneous gastrostomy with placement of a 20-French bumper retention tube in the body of the stomach. This tube can be used for percutaneous feeds beginning in 24 hours after placement. Electronically Signed   By: Aletta Edouard M.D.   On: 10/20/2019 17:00    Assessment/Plan Active Problems:   Acute on chronic respiratory failure with hypoxia (HCC)   Coronary artery disease involving native coronary artery of native heart without angina pectoris   COPD, severe (HCC)   Chronic HFrEF (heart  failure with reduced ejection fraction) ([place])   1. Acute on chronic respiratory failure hypoxia continue to wean on T collar goal is for 16 hours.  Continue secretion management supportive care. 2. Coronary artery disease at baseline no active chest pain 3. Severe COPD medical management 4. Chronic heart failure reduced ejection fraction continue with fluid management.   I have personally seen and evaluated the patient, evaluated laboratory and imaging results, formulated the assessment and plan and placed orders. The Patient requires high complexity decision making with multiple systems involvement.  Rounds were done with the Respiratory Therapy Director and Staff therapists and discussed with nursing staff also.  Allyne Gee, MD [facility] Pulmonary Critical Care Medicine Sleep Medicine

## 2019-10-20 NOTE — Procedures (Signed)
Interventional Radiology Procedure Note  Procedure: Gastrostomy tube placment  Complications: None  Estimated Blood Loss: < 10 mL  Findings: 20 Fr bumper retention gastrostomy tube placed. Tip in body of stomach. OK to use in 24 hours.  Venetia Night. Kathlene Cote, M.D Pager:  [phone]

## 2019-10-21 NOTE — Progress Notes (Signed)
Pulmonary Critical Care Medicine [place]   PULMONARY CRITICAL CARE SERVICE  PROGRESS NOTE  Date of Service: 10/21/2019  Barbaraann Avans  XQJ:[id number]  DOB: 06/06/1955   DOA: 10/04/2019  Referring Physician: Merton Border, MD  HPI: Chelsea Allison is a 64 y.o. female seen for follow up of Acute on Chronic Respiratory Failure.  Patient currently is on 40% FiO2 the goal is for 16 hours on T collar  Medications: Reviewed on Rounds  Physical Exam:  Vitals: Temperature is 98.0 pulse 64 respiratory 17 blood pressure is 102/64 saturations 100%  Ventilator Settings on T collar FiO2 of 40%  . General: Comfortable at this time . Eyes: Grossly normal lids, irises & conjunctiva . ENT: grossly tongue is normal . Neck: no obvious mass . Cardiovascular: S1 S2 normal no gallop . Respiratory: No rhonchi no rales noted . Abdomen: soft . Skin: no rash seen on limited exam . Musculoskeletal: not rigid . Psychiatric:unable to assess . Neurologic: no seizure no involuntary movements         Lab Data:   Basic Metabolic Panel: Recent Labs  Lab 10/16/19 0626 10/18/19 0720 10/19/19 0502  NA 137 137 139  K 3.9 3.5 3.6  CL 103 101 101  CO2 '26 25 27  ' GLUCOSE 102* 104* 107*  BUN 29* 31* 28*  CREATININE 0.62 0.67 0.79  CALCIUM 9.6 9.8 10.1  MG  --  2.0  --     ABG: No results for input(s): PHART, PCO2ART, PO2ART, HCO3, O2SAT in the last 168 hours.  Liver Function Tests: No results for input(s): AST, ALT, ALKPHOS, BILITOT, PROT, ALBUMIN in the last 168 hours. No results for input(s): LIPASE, AMYLASE in the last 168 hours. No results for input(s): AMMONIA in the last 168 hours.  CBC: Recent Labs  Lab 10/16/19 0626 10/18/19 0720 10/19/19 0502  WBC 6.2 6.8 6.3  HGB 9.7* 9.0* 10.5*  HCT 32.6* 30.7* 35.7*  MCV 93.7 92.2 91.1  PLT 287 270 302    Cardiac Enzymes: No results for input(s): CKTOTAL, CKMB, CKMBINDEX, TROPONINI in the last 168 hours.  BNP (last 3  results) Recent Labs    08/13/19 0225 09/05/19 0735 10/10/19 1120  BNP 929.4* 2,477.5* 973.2*    ProBNP (last 3 results) No results for input(s): PROBNP in the last 8760 hours.  Radiological Exams: IR GASTROSTOMY TUBE MOD SED  Result Date: 10/20/2019 CLINICAL DATA:  Acute on chronic respiratory failure and need for percutaneous gastrostomy tube for long-term nutrition. EXAM: PERCUTANEOUS GASTROSTOMY TUBE PLACEMENT ANESTHESIA/SEDATION: 1.0 mg IV Versed; 50 mcg IV Fentanyl. Total Moderate Sedation Time 12 minutes. The patient's level of consciousness and physiologic status were continuously monitored during the procedure by Radiology nursing. CONTRAST:  23m OMNIPAQUE IOHEXOL 300 MG/ML  SOLN MEDICATIONS: 2 g IV Ancef. IV antibiotic was administered in an appropriate time interval prior to needle puncture of the skin. The patient also received 0.5 mg IV glucagon during the procedure. FLUOROSCOPY TIME:  3 minutes and 24 seconds.  16.0 mGy. PROCEDURE: The procedure, risks, benefits, and alternatives were explained to the patient. Questions regarding the procedure were encouraged and answered. The patient understands and consents to the procedure. A time-out was performed prior to initiating the procedure. A 5-French catheter was then advanced through the patient's mouth under fluoroscopy into the esophagus and to the level of the stomach. This catheter was used to insufflate the stomach with air under fluoroscopy. The abdominal wall was prepped with Betadine in a sterile fashion, and  a sterile drape was applied covering the operative field. A sterile gown and sterile gloves were used for the procedure. Local anesthesia was provided with 1% Lidocaine. A skin incision was made in the upper abdominal wall. Under fluoroscopy, an 18 gauge trocar needle was advanced into the stomach. Contrast injection was performed to confirm intraluminal position of the needle tip. A single T tack was then deployed in the  lumen of the stomach. This was brought up to tension at the skin surface. Over a guidewire, a 9-French sheath was advanced into the lumen of the stomach. The wire was left in place as a safety wire. A loop snare device from a percutaneous gastrostomy kit was then advanced into the stomach. A floppy guide wire was advanced through the orogastric catheter under fluoroscopy in the stomach. The loop snare advanced through the percutaneous gastric access was used to snare the guide wire. This allowed withdrawal of the loop snare out of the patient's mouth by retraction of the orogastric catheter and wire. A 20-French bumper retention gastrostomy tube was looped around the snare device. It was then pulled back through the patient's mouth. The retention bumper was brought up to the anterior gastric wall. The T tack suture was cut at the skin. The exiting gastrostomy tube was cut to appropriate length and a feeding adapter applied. The catheter was injected with contrast material to confirm position and a fluoroscopic spot image saved. The tube was then flushed with saline. A dressing was applied over the gastrostomy exit site. COMPLICATIONS: None. FINDINGS: The stomach distended well with air allowing safe placement of the gastrostomy tube. After placement, the tip of the gastrostomy tube lies in the body of the stomach. IMPRESSION: Percutaneous gastrostomy with placement of a 20-French bumper retention tube in the body of the stomach. This tube can be used for percutaneous feeds beginning in 24 hours after placement. Electronically Signed   By: Aletta Edouard M.D.   On: 10/20/2019 17:00    Assessment/Plan Active Problems:   Acute on chronic respiratory failure with hypoxia (HCC)   Coronary artery disease involving native coronary artery of native heart without angina pectoris   COPD, severe (HCC)   Chronic HFrEF (heart failure with reduced ejection fraction) ([place])   1. Acute on chronic respiratory failure with  hypoxia she is doing better her anxiety appears to be under better control. 2. Coronary artery disease at baseline we will continue to follow along. 3. Severe COPD medical management 4. Chronic heart failure reduced ejection fraction we will continue to monitor closely   I have personally seen and evaluated the patient, evaluated laboratory and imaging results, formulated the assessment and plan and placed orders. The Patient requires high complexity decision making with multiple systems involvement.  Rounds were done with the Respiratory Therapy Director and Staff therapists and discussed with nursing staff also.  Allyne Gee, MD [facility] Pulmonary Critical Care Medicine Sleep Medicine

## 2019-10-21 NOTE — Progress Notes (Signed)
Referring Physician(s): Merton Border  Supervising Physician: Aletta Edouard  Patient Status:  [facility] - In-pt  Chief Complaint: Pain at gastrostomy tube  Subjective:  Dysphasia s/p percutaneous gastrostomy tube placement in IR 10/20/2019 by Dr. Kathlene Cote. Patient awake and alert sitting in bed, tracheostomy in place. Using whiteboard for communication. Complains of tenderness of gastrostomy tube, as expected. Gastrostomy tube site c/d/i.   Allergies: Statins, Hydrochlorothiazide, Hydrocodone, Lisinopril, and Oxycodone  Medications: Prior to Admission medications   Medication Sig Start Date End Date Taking? Authorizing Provider  acetaminophen (TYLENOL) 325 MG tablet Place 2 tablets (650 mg total) into feeding tube every 6 (six) hours as needed for mild pain, fever or headache. 10/04/19   Elgergawy, Silver Huguenin, MD  albuterol (PROVENTIL) (2.5 MG/3ML) 0.083% nebulizer solution Take 3 mLs (2.5 mg total) by nebulization every 2 (two) hours as needed for wheezing or shortness of breath. 10/04/19   Elgergawy, Silver Huguenin, MD  arformoterol (BROVANA) 15 MCG/2ML NEBU Take 2 mLs (15 mcg total) by nebulization 2 (two) times daily. 10/04/19   Elgergawy, Silver Huguenin, MD  BREO ELLIPTA 100-25 MCG/INH AEPB Inhale 1 puff into the lungs daily. 08/29/19   Reed, Tiffany L, DO  budesonide (PULMICORT) 0.25 MG/2ML nebulizer solution Take 2 mLs (0.25 mg total) by nebulization 2 (two) times daily. 10/04/19   Elgergawy, Silver Huguenin, MD  buprenorphine (BUTRANS) 20 MCG/HR PTWK Place 1 patch onto the skin once a week. 08/30/19   [provider]  clonazePAM (KLONOPIN) 0.5 MG tablet Place 1 tablet (0.5 mg total) into feeding tube 2 (two) times daily. 10/04/19   Elgergawy, Silver Huguenin, MD  docusate (COLACE) 50 MG/5ML liquid Place 10 mLs (100 mg total) into feeding tube 2 (two) times daily. 10/04/19   Elgergawy, Silver Huguenin, MD  enoxaparin (LOVENOX) 40 MG/0.4ML injection Inject 0.4 mLs (40 mg total) into the skin daily. 10/05/19    Elgergawy, Silver Huguenin, MD  guaiFENesin (ROBITUSSIN) 100 MG/5ML SOLN Place 15 mLs (300 mg total) into feeding tube every 6 (six) hours. 10/04/19   Elgergawy, Silver Huguenin, MD  insulin aspart (NOVOLOG) 100 UNIT/ML injection Inject 0-15 Units into the skin every 4 (four) hours. 10/04/19   Elgergawy, Silver Huguenin, MD  Nutritional Supplements (FEEDING SUPPLEMENT, VITAL 1.5 CAL,) LIQD Place 1,000 mLs into feeding tube continuous. 60 ml/hour 10/04/19   Elgergawy, Silver Huguenin, MD  oxyCODONE 10 MG TABS Place 0.5 tablets (5 mg total) into feeding tube every 6 (six) hours as needed for severe pain. 10/04/19   Elgergawy, Silver Huguenin, MD  pantoprazole sodium (PROTONIX) 40 mg/20 mL PACK Place 20 mLs (40 mg total) into feeding tube daily. 10/05/19   Elgergawy, Silver Huguenin, MD  polyethylene glycol (MIRALAX / GLYCOLAX) 17 g packet Place 17 g into feeding tube daily. 10/05/19   Elgergawy, Silver Huguenin, MD  QUEtiapine (SEROQUEL) 25 MG tablet Place 1 tablet (25 mg total) into feeding tube 2 (two) times daily. 10/04/19   Elgergawy, Silver Huguenin, MD  revefenacin (YUPELRI) 175 MCG/3ML nebulizer solution Take 3 mLs (175 mcg total) by nebulization daily. 10/05/19   Elgergawy, Silver Huguenin, MD  rosuvastatin (CRESTOR) 20 MG tablet Place 1 tablet (20 mg total) into feeding tube daily. 10/05/19   Elgergawy, Silver Huguenin, MD  ticagrelor (BRILINTA) 90 MG TABS tablet Place 1 tablet (90 mg total) into feeding tube 2 (two) times daily. 10/04/19   Elgergawy, Silver Huguenin, MD  umeclidinium bromide (INCRUSE ELLIPTA) 62.5 MCG/INH AEPB Inhale 1 puff into the lungs daily. 08/29/19   Mariea Clonts,  Tiffany L, DO  vitamin B-12 1000 MCG tablet Place 1 tablet (1,000 mcg total) into feeding tube every other day. 10/05/19   Elgergawy, Silver Huguenin, MD  Vitamin D, Ergocalciferol, (DRISDOL) 1.25 MG (50000 UNIT) CAPS capsule Place 1 capsule (50,000 Units total) into feeding tube every 7 (seven) days. 10/11/19   Elgergawy, Silver Huguenin, MD  Water For Irrigation, Sterile (FREE WATER) SOLN Place 100 mLs into feeding tube  every 8 (eight) hours. 10/04/19   Elgergawy, Silver Huguenin, MD     Vital Signs: BP 119/76 (BP Location: Left Arm)   Pulse 85   Resp 20   SpO2 100%   Physical Exam Vitals and nursing note reviewed.  Constitutional:      General: She is not in acute distress.    Comments: Tracheostomy.  Pulmonary:     Effort: Pulmonary effort is normal. No respiratory distress.     Comments: Tracheostomy. Abdominal:     Comments: Gastrostomy tube site with mild tenderness, no erythema, drainage, or active bleeding; bumper cinched to skin.  Skin:    General: Skin is warm.  Neurological:     Mental Status: She is alert and oriented to person, place, and time.     Imaging: IR GASTROSTOMY TUBE MOD SED  Result Date: 10/20/2019 CLINICAL DATA:  Acute on chronic respiratory failure and need for percutaneous gastrostomy tube for long-term nutrition. EXAM: PERCUTANEOUS GASTROSTOMY TUBE PLACEMENT ANESTHESIA/SEDATION: 1.0 mg IV Versed; 50 mcg IV Fentanyl. Total Moderate Sedation Time 12 minutes. The patient's level of consciousness and physiologic status were continuously monitored during the procedure by Radiology nursing. CONTRAST:  53m OMNIPAQUE IOHEXOL 300 MG/ML  SOLN MEDICATIONS: 2 g IV Ancef. IV antibiotic was administered in an appropriate time interval prior to needle puncture of the skin. The patient also received 0.5 mg IV glucagon during the procedure. FLUOROSCOPY TIME:  3 minutes and 24 seconds.  16.0 mGy. PROCEDURE: The procedure, risks, benefits, and alternatives were explained to the patient. Questions regarding the procedure were encouraged and answered. The patient understands and consents to the procedure. A time-out was performed prior to initiating the procedure. A 5-French catheter was then advanced through the patient's mouth under fluoroscopy into the esophagus and to the level of the stomach. This catheter was used to insufflate the stomach with air under fluoroscopy. The abdominal wall was prepped  with Betadine in a sterile fashion, and a sterile drape was applied covering the operative field. A sterile gown and sterile gloves were used for the procedure. Local anesthesia was provided with 1% Lidocaine. A skin incision was made in the upper abdominal wall. Under fluoroscopy, an 18 gauge trocar needle was advanced into the stomach. Contrast injection was performed to confirm intraluminal position of the needle tip. A single T tack was then deployed in the lumen of the stomach. This was brought up to tension at the skin surface. Over a guidewire, a 9-French sheath was advanced into the lumen of the stomach. The wire was left in place as a safety wire. A loop snare device from a percutaneous gastrostomy kit was then advanced into the stomach. A floppy guide wire was advanced through the orogastric catheter under fluoroscopy in the stomach. The loop snare advanced through the percutaneous gastric access was used to snare the guide wire. This allowed withdrawal of the loop snare out of the patient's mouth by retraction of the orogastric catheter and wire. A 20-French bumper retention gastrostomy tube was looped around the snare device. It was then pulled  back through the patient's mouth. The retention bumper was brought up to the anterior gastric wall. The T tack suture was cut at the skin. The exiting gastrostomy tube was cut to appropriate length and a feeding adapter applied. The catheter was injected with contrast material to confirm position and a fluoroscopic spot image saved. The tube was then flushed with saline. A dressing was applied over the gastrostomy exit site. COMPLICATIONS: None. FINDINGS: The stomach distended well with air allowing safe placement of the gastrostomy tube. After placement, the tip of the gastrostomy tube lies in the body of the stomach. IMPRESSION: Percutaneous gastrostomy with placement of a 20-French bumper retention tube in the body of the stomach. This tube can be used for  percutaneous feeds beginning in 24 hours after placement. Electronically Signed   By: Aletta Edouard M.D.   On: 10/20/2019 17:00   ECHOCARDIOGRAM COMPLETE  Result Date: 10/17/2019    ECHOCARDIOGRAM REPORT   Patient Name:   Chelsea Allison Date of Exam: 10/17/2019 Medical Rec #:  [id number]    Height:       69.0 in Accession #:    [id number]   Weight:       182.3 lb Date of Birth:  28-Nov-1955    BSA:          1.986 m Patient Age:    64 years     BP:           104/58 mmHg Patient Gender: F            HR:           77 bpm. Exam Location:  Inpatient Procedure: 2D Echo, Cardiac Doppler and Color Doppler Indications:     ; F74.94 Chronic systolic (congestive) heart failure  History:         Patient has prior history of Echocardiogram examinations, most                  recent 08/12/2019. Cardiomyopathy, Previous Myocardial Infarction                  and CAD; Risk Factors:Former Smoker, Dyslipidemia and                  Hypertension.  Sonographer:     Jonelle Sidle Dance Referring Phys:  [id number] ALI HIJAZI Diagnosing Phys: Dixie Dials MD  Sonographer Comments: Echo performed with patient supine and on artificial respirator. IMPRESSIONS  1. Left ventricular ejection fraction, by estimation, is 35 to 40%. The left ventricle has moderately decreased function. The left ventricle demonstrates global hypokinesis. There is mild asymmetric left ventricular hypertrophy of the posterior segment.  Left ventricular diastolic parameters are consistent with Grade I diastolic dysfunction (impaired relaxation).  2. Right ventricular systolic function is normal. The right ventricular size is normal.  3. Left atrial size was mildly dilated.  4. The pericardial effusion is circumferential. There is no evidence of cardiac tamponade. Large pleural effusion in the left lateral region.  5. The mitral valve is degenerative. Mild mitral valve regurgitation.  6. The aortic valve is tricuspid. Aortic valve regurgitation is not visualized. Mild aortic valve  sclerosis is present, with no evidence of aortic valve stenosis.  7. There is mild (Grade II) atheroma plaque involving the ascending aorta and aortic root.  8. The inferior vena cava is dilated in size with <50% respiratory variability, suggesting right atrial pressure of 15 mmHg. FINDINGS  Left Ventricle: Left ventricular ejection fraction, by estimation, is 35 to 40%.  The left ventricle has moderately decreased function. The left ventricle demonstrates global hypokinesis. The left ventricular internal cavity size was normal in size. There is mild asymmetric left ventricular hypertrophy of the posterior segment. Left ventricular diastolic parameters are consistent with Grade I diastolic dysfunction (impaired relaxation).  LV Wall Scoring: The entire anterior wall, entire lateral wall, entire apex, and entire inferior wall are hypokinetic. The anterior septum, mid inferoseptal segment, and basal inferoseptal segment are normal. Right Ventricle: The right ventricular size is normal. No increase in right ventricular wall thickness. Right ventricular systolic function is normal. Left Atrium: Left atrial size was mildly dilated. Right Atrium: Right atrial size was normal in size. Pericardium: Trivial pericardial effusion is present. The pericardial effusion is circumferential. There is no evidence of cardiac tamponade. Mitral Valve: The mitral valve is degenerative in appearance. There is mild thickening of the mitral valve leaflet(s). There is moderate calcification of the posterior mitral valve leaflet(s). Mildly decreased mobility of the mitral valve leaflets. Moderate to severe mitral annular calcification. Mild mitral valve regurgitation. Tricuspid Valve: The tricuspid valve is normal in structure. Tricuspid valve regurgitation is mild. Aortic Valve: The aortic valve is tricuspid. . There is moderate thickening and moderate calcification of the aortic valve. Aortic valve regurgitation is not visualized. Mild  aortic valve sclerosis is present, with no evidence of aortic valve stenosis. Mild aortic valve annular calcification. There is moderate thickening of the aortic valve. There is moderate calcification of the aortic valve. Pulmonic Valve: The pulmonic valve was normal in structure. Pulmonic valve regurgitation is not visualized. Aorta: The aortic root is normal in size and structure. There is mild (Grade II) atheroma plaque involving the ascending aorta and aortic root. Venous: The inferior vena cava is dilated in size with less than 50% respiratory variability, suggesting right atrial pressure of 15 mmHg. IAS/Shunts: The interatrial septum was not assessed. Additional Comments: There is a large pleural effusion in the left lateral region.  LEFT VENTRICLE PLAX 2D LVIDd:         5.10 cm  Diastology LVIDs:         4.00 cm  LV e' lateral:   8.05 cm/s LV PW:         1.40 cm  LV E/e' lateral: 11.8 LV IVS:        0.90 cm  LV e' medial:    4.68 cm/s LVOT diam:     1.90 cm  LV E/e' medial:  20.3 LV SV:         51 LV SV Index:   26 LVOT Area:     2.84 cm  RIGHT VENTRICLE             IVC RV S prime:     10.30 cm/s  IVC diam: 2.30 cm TAPSE (M-mode): 1.5 cm LEFT ATRIUM           Index       RIGHT ATRIUM           Index LA diam:      3.90 cm 1.96 cm/m  RA Area:     12.70 cm LA Vol (A2C): 73.3 ml 36.90 ml/m RA Volume:   34.40 ml  17.32 ml/m LA Vol (A4C): 40.6 ml 20.44 ml/m  AORTIC VALVE LVOT Vmax:   85.75 cm/s LVOT Vmean:  60.250 cm/s LVOT VTI:    0.180 m  AORTA Ao Root diam: 3.30 cm Ao Asc diam:  2.10 cm MITRAL VALVE MV Area (PHT): 4.15 cm  SHUNTS MV Decel Time: 183 msec    Systemic VTI:  0.18 m MV E velocity: 95.10 cm/s  Systemic Diam: 1.90 cm MV A velocity: 85.70 cm/s MV E/A ratio:  1.11 Dixie Dials MD Electronically signed by Dixie Dials MD Signature Date/Time: 10/17/2019/2:46:20 PM    Final     Labs:  CBC: Recent Labs    10/14/19 0722 10/16/19 0626 10/18/19 0720 10/19/19 0502  WBC 6.5 6.2 6.8 6.3  HGB  9.2* 9.7* 9.0* 10.5*  HCT 31.4* 32.6* 30.7* 35.7*  PLT 272 287 270 302    COAGS: Recent Labs    12/12/18 0657 08/12/19 0727 09/12/19 0412 10/18/19 0902  INR 1.0 1.1 1.1 1.2  APTT 30 29  --   --     BMP: Recent Labs    10/14/19 0722 10/16/19 0626 10/18/19 0720 10/19/19 0502  NA 139 137 137 139  K 3.4* 3.9 3.5 3.6  CL 101 103 101 101  CO2 '27 26 25 27  ' GLUCOSE 115* 102* 104* 107*  BUN 31* 29* 31* 28*  CALCIUM 9.5 9.6 9.8 10.1  CREATININE 0.56 0.62 0.67 0.79  GFRNONAA >60 >60 >60 >60  GFRAA >60 >60 >60 >60    LIVER FUNCTION TESTS: Recent Labs    09/05/19 0735 09/26/19 0505 09/30/19 0453 10/05/19 0511  BILITOT 0.6 0.4 0.2* 0.2*  AST '17 18 17 15  ' ALT '24 8 11 10  ' ALKPHOS 81 77 67 76  PROT 6.2* 6.6 5.9* 5.4*  ALBUMIN 2.5* 2.5* 2.5* 2.4*    Assessment and Plan:  Dysphasia s/p percutaneous gastrostomy tube placement in IR 10/20/2019 by Dr. Kathlene Cote. Gastrostomy tube stable, tube is ready for use. Please ensure bumper remains cinched to skin to prevent leakage. Case discussed with Dr. Laren Everts. Further plans per [facility]- appreciate and agree with management. Please call IR with questions/concerns.   Electronically Signed: Earley Abide, PA-C 10/21/2019, 11:18 AM   I spent a total of 15 Minutes at the the patient's bedside AND on the patient's hospital floor or unit, greater than 50% of which was counseling/coordinating care for dysphasia s/p gastrostomy tube placement.

## 2019-10-22 ENCOUNTER — Other Ambulatory Visit (HOSPITAL_COMMUNITY): Payer: Medicare HMO

## 2019-10-22 LAB — CBC
HCT: 35.8 % — ABNORMAL LOW (ref 36.0–46.0)
Hemoglobin: 10.5 g/dL — ABNORMAL LOW (ref 12.0–15.0)
MCH: 26.6 pg (ref 26.0–34.0)
MCHC: 29.3 g/dL — ABNORMAL LOW (ref 30.0–36.0)
MCV: 90.6 fL (ref 80.0–100.0)
Platelets: 312 10*3/uL (ref 150–400)
RBC: 3.95 MIL/uL (ref 3.87–5.11)
RDW: 15.4 % (ref 11.5–15.5)
WBC: 8.6 10*3/uL (ref 4.0–10.5)
nRBC: 0 % (ref 0.0–0.2)

## 2019-10-22 LAB — BASIC METABOLIC PANEL
Anion gap: 12 (ref 5–15)
BUN: 24 mg/dL — ABNORMAL HIGH (ref 8–23)
CO2: 27 mmol/L (ref 22–32)
Calcium: 10 mg/dL (ref 8.9–10.3)
Chloride: 100 mmol/L (ref 98–111)
Creatinine, Ser: 0.73 mg/dL (ref 0.44–1.00)
GFR calc Af Amer: >60 mL/min (ref >60)
GFR calc non Af Amer: >60 mL/min (ref >60)
Glucose, Bld: 135 mg/dL — ABNORMAL HIGH (ref 70–99)
Potassium: 3.4 mmol/L — ABNORMAL LOW (ref 3.5–5.1)
Sodium: 139 mmol/L (ref 135–145)

## 2019-10-22 NOTE — Progress Notes (Signed)
Pulmonary Critical Care Medicine [place]   PULMONARY CRITICAL CARE SERVICE  PROGRESS NOTE  Date of Service: 10/22/2019  Mileena Rothenberger  EGB:[id number]  DOB: [birth year]   DOA: 10/04/2019  Referring Physician: Merton Border, MD  HPI: Edlyn Rosenburg is a 64 y.o. female seen for follow up of Acute on Chronic Respiratory Failure.  Patient is on the ventilator and full support currently on 40% FiO2 was able to do about 20 hours of T collar yesterday  Medications: Reviewed on Rounds  Physical Exam:  Vitals: Temperature 97.5 pulse 71 respiratory rate is 24 blood pressure 104 55 saturations 99%  Ventilator Settings on assist control FiO2 40% tidal volume 500 PEEP 5  . General: Comfortable at this time . Eyes: Grossly normal lids, irises & conjunctiva . ENT: grossly tongue is normal . Neck: no obvious mass . Cardiovascular: S1 S2 normal no gallop . Respiratory: No rhonchi coarse breath sounds . Abdomen: soft . Skin: no rash seen on limited exam . Musculoskeletal: not rigid . Psychiatric:unable to assess . Neurologic: no seizure no involuntary movements         Lab Data:   Basic Metabolic Panel: Recent Labs  Lab 10/16/19 0626 10/18/19 0720 10/19/19 0502  NA 137 137 139  K 3.9 3.5 3.6  CL 103 101 101  CO2 '26 25 27  ' GLUCOSE 102* 104* 107*  BUN 29* 31* 28*  CREATININE 0.62 0.67 0.79  CALCIUM 9.6 9.8 10.1  MG  --  2.0  --     ABG: No results for input(s): PHART, PCO2ART, PO2ART, HCO3, O2SAT in the last 168 hours.  Liver Function Tests: No results for input(s): AST, ALT, ALKPHOS, BILITOT, PROT, ALBUMIN in the last 168 hours. No results for input(s): LIPASE, AMYLASE in the last 168 hours. No results for input(s): AMMONIA in the last 168 hours.  CBC: Recent Labs  Lab 10/16/19 0626 10/18/19 0720 10/19/19 0502  WBC 6.2 6.8 6.3  HGB 9.7* 9.0* 10.5*  HCT 32.6* 30.7* 35.7*  MCV 93.7 92.2 91.1  PLT 287 270 302    Cardiac Enzymes: No results for  input(s): CKTOTAL, CKMB, CKMBINDEX, TROPONINI in the last 168 hours.  BNP (last 3 results) Recent Labs    08/13/19 0225 09/05/19 0735 10/10/19 1120  BNP 929.4* 2,477.5* 973.2*    ProBNP (last 3 results) No results for input(s): PROBNP in the last 8760 hours.  Radiological Exams: IR GASTROSTOMY TUBE MOD SED  Result Date: 10/20/2019 CLINICAL DATA:  Acute on chronic respiratory failure and need for percutaneous gastrostomy tube for long-term nutrition. EXAM: PERCUTANEOUS GASTROSTOMY TUBE PLACEMENT ANESTHESIA/SEDATION: 1.0 mg IV Versed; 50 mcg IV Fentanyl. Total Moderate Sedation Time 12 minutes. The patient's level of consciousness and physiologic status were continuously monitored during the procedure by Radiology nursing. CONTRAST:  44m OMNIPAQUE IOHEXOL 300 MG/ML  SOLN MEDICATIONS: 2 g IV Ancef. IV antibiotic was administered in an appropriate time interval prior to needle puncture of the skin. The patient also received 0.5 mg IV glucagon during the procedure. FLUOROSCOPY TIME:  3 minutes and 24 seconds.  16.0 mGy. PROCEDURE: The procedure, risks, benefits, and alternatives were explained to the patient. Questions regarding the procedure were encouraged and answered. The patient understands and consents to the procedure. A time-out was performed prior to initiating the procedure. A 5-French catheter was then advanced through the patient's mouth under fluoroscopy into the esophagus and to the level of the stomach. This catheter was used to insufflate the stomach with air under  fluoroscopy. The abdominal wall was prepped with Betadine in a sterile fashion, and a sterile drape was applied covering the operative field. A sterile gown and sterile gloves were used for the procedure. Local anesthesia was provided with 1% Lidocaine. A skin incision was made in the upper abdominal wall. Under fluoroscopy, an 18 gauge trocar needle was advanced into the stomach. Contrast injection was performed to confirm  intraluminal position of the needle tip. A single T tack was then deployed in the lumen of the stomach. This was brought up to tension at the skin surface. Over a guidewire, a 9-French sheath was advanced into the lumen of the stomach. The wire was left in place as a safety wire. A loop snare device from a percutaneous gastrostomy kit was then advanced into the stomach. A floppy guide wire was advanced through the orogastric catheter under fluoroscopy in the stomach. The loop snare advanced through the percutaneous gastric access was used to snare the guide wire. This allowed withdrawal of the loop snare out of the patient's mouth by retraction of the orogastric catheter and wire. A 20-French bumper retention gastrostomy tube was looped around the snare device. It was then pulled back through the patient's mouth. The retention bumper was brought up to the anterior gastric wall. The T tack suture was cut at the skin. The exiting gastrostomy tube was cut to appropriate length and a feeding adapter applied. The catheter was injected with contrast material to confirm position and a fluoroscopic spot image saved. The tube was then flushed with saline. A dressing was applied over the gastrostomy exit site. COMPLICATIONS: None. FINDINGS: The stomach distended well with air allowing safe placement of the gastrostomy tube. After placement, the tip of the gastrostomy tube lies in the body of the stomach. IMPRESSION: Percutaneous gastrostomy with placement of a 20-French bumper retention tube in the body of the stomach. This tube can be used for percutaneous feeds beginning in 24 hours after placement. Electronically Signed   By: Aletta Edouard M.D.   On: 10/20/2019 17:00    Assessment/Plan Active Problems:   Acute on chronic respiratory failure with hypoxia (HCC)   Coronary artery disease involving native coronary artery of native heart without angina pectoris   COPD, severe (HCC)   Chronic HFrEF (heart failure with  reduced ejection fraction) ([place])   1. Acute on chronic respiratory failure with hypoxia on assist control right now good saturations are noted today she will go back on the T collar and try for 24 hours 2. Coronary artery disease no change we will continue to follow along. 3. Severe COPD medical management 4. Chronic heart failure reduced ejection fraction appears to be compensated we will continue to follow   I have personally seen and evaluated the patient, evaluated laboratory and imaging results, formulated the assessment and plan and placed orders. The Patient requires high complexity decision making with multiple systems involvement.  Rounds were done with the Respiratory Therapy Director and Staff therapists and discussed with nursing staff also.  Allyne Gee, MD [facility] Pulmonary Critical Care Medicine Sleep Medicine

## 2019-10-23 NOTE — Progress Notes (Signed)
Pulmonary Critical Care Medicine [place]   PULMONARY CRITICAL CARE SERVICE  PROGRESS NOTE  Date of Service: 10/23/2019  Chelsea Allison  ZYS:[id number]  DOB: June 28, 1955   DOA: 10/04/2019  Referring Physician: Merton Border, MD  HPI: Chelsea Allison is a 64 y.o. female seen for follow up of Acute on Chronic Respiratory Failure.  Patient currently is on T collar has been on 40% FiO2 appears to be doing well comfortable right now  Medications: Reviewed on Rounds  Physical Exam:  Vitals: Temperature is 98.7 pulse 87 respiratory 28 blood pressure is 115/59 saturations 98%  Ventilator Settings on T collar FiO2 40%  . General: Comfortable at this time . Eyes: Grossly normal lids, irises & conjunctiva . ENT: grossly tongue is normal . Neck: no obvious mass . Cardiovascular: S1 S2 normal no gallop . Respiratory: No rhonchi no rales . Abdomen: soft . Skin: no rash seen on limited exam . Musculoskeletal: not rigid . Psychiatric:unable to assess . Neurologic: no seizure no involuntary movements         Lab Data:   Basic Metabolic Panel: Recent Labs  Lab 10/18/19 0720 10/19/19 0502 10/22/19 1111  NA 137 139 139  K 3.5 3.6 3.4*  CL 101 101 100  CO2 25 27 27   GLUCOSE 104* 107* 135*  BUN 31* 28* 24*  CREATININE 0.67 0.79 0.73  CALCIUM 9.8 10.1 10.0  MG 2.0  --   --     ABG: No results for input(s): PHART, PCO2ART, PO2ART, HCO3, O2SAT in the last 168 hours.  Liver Function Tests: No results for input(s): AST, ALT, ALKPHOS, BILITOT, PROT, ALBUMIN in the last 168 hours. No results for input(s): LIPASE, AMYLASE in the last 168 hours. No results for input(s): AMMONIA in the last 168 hours.  CBC: Recent Labs  Lab 10/18/19 0720 10/19/19 0502 10/22/19 1111  WBC 6.8 6.3 8.6  HGB 9.0* 10.5* 10.5*  HCT 30.7* 35.7* 35.8*  MCV 92.2 91.1 90.6  PLT 270 302 312    Cardiac Enzymes: No results for input(s): CKTOTAL, CKMB, CKMBINDEX, TROPONINI in the last 168  hours.  BNP (last 3 results) Recent Labs    08/13/19 0225 09/05/19 0735 10/10/19 1120  BNP 929.4* 2,477.5* 973.2*    ProBNP (last 3 results) No results for input(s): PROBNP in the last 8760 hours.  Radiological Exams: DG Abd Portable 1V  Result Date: 10/22/2019 CLINICAL DATA:  Abdominal pain. EXAM: PORTABLE ABDOMEN - 1 VIEW COMPARISON:  CT abdomen pelvis 10/11/2019. FINDINGS: Mild gaseous distended loops of small and large bowel are demonstrated. Gastrostomy tube projects over the left upper quadrant. Supine evaluation limited for the detection of free intraperitoneal air. Contrast material in the bladder. Lumbar spine degenerative changes. IMPRESSION: Mild gaseous distended loops of small and large bowel may represent ileus. Electronically Signed   By: Lovey Newcomer M.D.   On: 10/22/2019 14:49    Assessment/Plan Active Problems:   Acute on chronic respiratory failure with hypoxia (HCC)   Coronary artery disease involving native coronary artery of native heart without angina pectoris   COPD, severe (HCC)   Chronic HFrEF (heart failure with reduced ejection fraction) ([place])   1. Acute on chronic respiratory failure hypoxia we will continue with T collar trials titrate oxygen continue pulmonary toilet. 2. Coronary artery disease at baseline we will continue to follow 3. Severe COPD at baseline 4. Chronic heart failure reduced ejection fraction at baseline   I have personally seen and evaluated the patient, evaluated laboratory  and imaging results, formulated the assessment and plan and placed orders. The Patient requires high complexity decision making with multiple systems involvement.  Rounds were done with the Respiratory Therapy Director and Staff therapists and discussed with nursing staff also.  Allyne Gee, MD [facility] Pulmonary Critical Care Medicine Sleep Medicine

## 2019-10-24 ENCOUNTER — Other Ambulatory Visit (HOSPITAL_COMMUNITY): Payer: Medicare HMO

## 2019-10-24 LAB — BLOOD GAS, ARTERIAL
Acid-Base Excess: 9.1 mmol/L — ABNORMAL HIGH (ref 0.0–2.0)
Bicarbonate: 33.5 mmol/L — ABNORMAL HIGH (ref 20.0–28.0)
FIO2: 35
O2 Saturation: 97 %
Patient temperature: 36.9
pCO2 arterial: 49.2 mmHg — ABNORMAL HIGH (ref 32.0–48.0)
pH, Arterial: 7.447 (ref 7.350–7.450)
pO2, Arterial: 80.1 mmHg — ABNORMAL LOW (ref 83.0–108.0)

## 2019-10-24 NOTE — Progress Notes (Addendum)
Pulmonary Critical Care Medicine [place]   PULMONARY CRITICAL CARE SERVICE  PROGRESS NOTE  Date of Service: 10/24/2019  Loray Akard  VVZ:[id number]  DOB: 04/04/1955   DOA: 10/04/2019  Referring Physician: Merton Border, MD  HPI: Jenevieve Kirschbaum is a 64 y.o. female seen for follow up of Acute on Chronic Respiratory Failure.  Patient has completed 24 goal on ATC is currently going for 48 hours.  35% FiO2 and satting well.  Medications: Reviewed on Rounds  Physical Exam:  Vitals: Pulse 105 respirations 30 BP 125/64 O2 sat 95% temp 98.9  Ventilator Settings ATC 35%  . General: Comfortable at this time . Eyes: Grossly normal lids, irises & conjunctiva . ENT: grossly tongue is normal . Neck: no obvious mass . Cardiovascular: S1 S2 normal no gallop . Respiratory: No rales or rhonchi noted . Abdomen: soft . Skin: no rash seen on limited exam . Musculoskeletal: not rigid . Psychiatric:unable to assess . Neurologic: no seizure no involuntary movements         Lab Data:   Basic Metabolic Panel: Recent Labs  Lab 10/18/19 0720 10/19/19 0502 10/22/19 1111  NA 137 139 139  K 3.5 3.6 3.4*  CL 101 101 100  CO2 25 27 27   GLUCOSE 104* 107* 135*  BUN 31* 28* 24*  CREATININE 0.67 0.79 0.73  CALCIUM 9.8 10.1 10.0  MG 2.0  --   --     ABG: Recent Labs  Lab 10/24/19 0918  PHART 7.447  PCO2ART 49.2*  PO2ART 80.1*  HCO3 33.5*  O2SAT 97.0    Liver Function Tests: No results for input(s): AST, ALT, ALKPHOS, BILITOT, PROT, ALBUMIN in the last 168 hours. No results for input(s): LIPASE, AMYLASE in the last 168 hours. No results for input(s): AMMONIA in the last 168 hours.  CBC: Recent Labs  Lab 10/18/19 0720 10/19/19 0502 10/22/19 1111  WBC 6.8 6.3 8.6  HGB 9.0* 10.5* 10.5*  HCT 30.7* 35.7* 35.8*  MCV 92.2 91.1 90.6  PLT 270 302 312    Cardiac Enzymes: No results for input(s): CKTOTAL, CKMB, CKMBINDEX, TROPONINI in the last 168 hours.  BNP  (last 3 results) Recent Labs    08/13/19 0225 09/05/19 0735 10/10/19 1120  BNP 929.4* 2,477.5* 973.2*    ProBNP (last 3 results) No results for input(s): PROBNP in the last 8760 hours.  Radiological Exams: CT ABDOMEN PELVIS WO CONTRAST  Result Date: 10/24/2019 CLINICAL DATA:  Discharge from the patient's PEG tube. EXAM: CT ABDOMEN AND PELVIS WITHOUT CONTRAST TECHNIQUE: Multidetector CT imaging of the abdomen and pelvis was performed following the standard protocol without IV contrast. COMPARISON:  CT abdomen and pelvis 10/11/2019. FINDINGS: Lower chest: Small left pleural effusion and basilar airspace disease are again seen. Small right pleural effusion seen on the prior study has resolved. There is mild linear atelectasis or scar in the right base. Hepatobiliary: The liver and biliary tree are unremarkable. Sludge and small stones are seen in the gallbladder but there is no evidence of cholecystitis. Pancreas: Unremarkable. No pancreatic ductal dilatation or surrounding inflammatory changes. Spleen: Normal in size without focal abnormality. Adrenals/Urinary Tract: Adrenal glands are unremarkable. Kidneys are normal, without renal calculi, focal lesion, or hydronephrosis. Bladder is unremarkable. Stomach/Bowel: The patient has a new gastrostomy tube in place. The tube is appropriately positioned. There is mild stranding in the anterior abdominal wall and minimal thickening of the left rectus muscle related to tube placement. Large left paracentral at large left lower quadrant parastomal  hernia containing bowel without obstruction or other complicating feature is unchanged. Vascular/Lymphatic: Aortic atherosclerosis. No enlarged abdominal or pelvic lymph nodes. Reproductive: Uterus and bilateral adnexa are unremarkable. Other: None. Musculoskeletal: No acute or focal abnormality. IMPRESSION: New gastrostomy tube projects in good position. There is mild stranding in subcutaneous fat about the tube and  mild thickening of the left rectus muscle consistent with postprocedural change. Small left pleural effusion and basilar airspace disease which could be atelectasis or pneumonia. Resolved small right pleural effusion. Small gallstones and sludge without evidence of cholecystitis. Aortic Atherosclerosis (ICD10-I70.0). Coronary atherosclerosis also noted. Electronically Signed   By: Inge Rise M.D.   On: 10/24/2019 12:13   DG Abd Portable 1V  Result Date: 10/22/2019 CLINICAL DATA:  Abdominal pain. EXAM: PORTABLE ABDOMEN - 1 VIEW COMPARISON:  CT abdomen pelvis 10/11/2019. FINDINGS: Mild gaseous distended loops of small and large bowel are demonstrated. Gastrostomy tube projects over the left upper quadrant. Supine evaluation limited for the detection of free intraperitoneal air. Contrast material in the bladder. Lumbar spine degenerative changes. IMPRESSION: Mild gaseous distended loops of small and large bowel may represent ileus. Electronically Signed   By: Lovey Newcomer M.D.   On: 10/22/2019 14:49    Assessment/Plan Active Problems:   Acute on chronic respiratory failure with hypoxia (HCC)   Coronary artery disease involving native coronary artery of native heart without angina pectoris   COPD, severe (HCC)   Chronic HFrEF (heart failure with reduced ejection fraction) ([place])   1. Acute on chronic respiratory failure hypoxia we will continue with T collar trials titrate oxygen continue pulmonary toilet. 2. Coronary artery disease at baseline we will continue to follow 3. Severe COPD at baseline 4. Chronic heart failure reduced ejection fraction at baseline   I have personally seen and evaluated the patient, evaluated laboratory and imaging results, formulated the assessment and plan and placed orders. The Patient requires high complexity decision making with multiple systems involvement.  Rounds were done with the Respiratory Therapy Director and Staff therapists and discussed with nursing  staff also.  Allyne Gee, MD [facility] Pulmonary Critical Care Medicine Sleep Medicine'

## 2019-10-24 NOTE — Progress Notes (Signed)
Patient seen at bedside at the request of Team regarding patient's Gastrostomy tube that was placed in IR on 8.13.21. Team is reporting leaking and abdominal pain.CT abdomen from 8.17.21 shows existing G tube in appropriate position. Gastrostomy tube connected to tube feeds with no issues. Minimal leaking noted around exit site which is not an uncommon finding. Patient describes pain and suprapubic thatt is inferior to the exit site.   Case discussed with IR Attending Dr. Rolla Plate. Gastrostomy tube is okay to continue using. Requested team placed a 4 X 4 under the flange to assist with leaking.

## 2019-10-25 DIAGNOSIS — J9620 Acute and chronic respiratory failure, unspecified whether with hypoxia or hypercapnia: Secondary | ICD-10-CM

## 2019-10-25 NOTE — Progress Notes (Addendum)
Pulmonary Critical Care Medicine [place]   PULMONARY CRITICAL CARE SERVICE  PROGRESS NOTE  Date of Service: 10/25/2019  Karrington Mccravy  FUX:[id number]  DOB: [birth year]   DOA: 10/04/2019  Referring Physician: Merton Border, MD  HPI: Chelsea Allison is a 64 y.o. female seen for follow up of Acute on Chronic Respiratory Failure.  Patient remains on 40% T-bar satting well no fever distress at this time.    Medications: Reviewed on Rounds  Physical Exam:  Vitals: Pulse 85 respirations 34 BP is 138/66 O2 sat 94% temp 96.7  Ventilator Settings 40% T-bar  . General: Comfortable at this time . Eyes: Grossly normal lids, irises & conjunctiva . ENT: grossly tongue is normal . Neck: no obvious mass . Cardiovascular: S1 S2 normal no gallop . Respiratory: Coarse breath sounds . Abdomen: soft . Skin: no rash seen on limited exam . Musculoskeletal: not rigid . Psychiatric:unable to assess . Neurologic: no seizure no involuntary movements         Lab Data:   Basic Metabolic Panel: Recent Labs  Lab 10/19/19 0502 10/22/19 1111  NA 139 139  K 3.6 3.4*  CL 101 100  CO2 27 27  GLUCOSE 107* 135*  BUN 28* 24*  CREATININE 0.79 0.73  CALCIUM 10.1 10.0    ABG: Recent Labs  Lab 10/24/19 0918  PHART 7.447  PCO2ART 49.2*  PO2ART 80.1*  HCO3 33.5*  O2SAT 97.0    Liver Function Tests: No results for input(s): AST, ALT, ALKPHOS, BILITOT, PROT, ALBUMIN in the last 168 hours. No results for input(s): LIPASE, AMYLASE in the last 168 hours. No results for input(s): AMMONIA in the last 168 hours.  CBC: Recent Labs  Lab 10/19/19 0502 10/22/19 1111  WBC 6.3 8.6  HGB 10.5* 10.5*  HCT 35.7* 35.8*  MCV 91.1 90.6  PLT 302 312    Cardiac Enzymes: No results for input(s): CKTOTAL, CKMB, CKMBINDEX, TROPONINI in the last 168 hours.  BNP (last 3 results) Recent Labs    08/13/19 0225 09/05/19 0735 10/10/19 1120  BNP 929.4* 2,477.5* 973.2*    ProBNP (last 3  results) No results for input(s): PROBNP in the last 8760 hours.  Radiological Exams: CT ABDOMEN PELVIS WO CONTRAST  Result Date: 10/24/2019 CLINICAL DATA:  Discharge from the patient's PEG tube. EXAM: CT ABDOMEN AND PELVIS WITHOUT CONTRAST TECHNIQUE: Multidetector CT imaging of the abdomen and pelvis was performed following the standard protocol without IV contrast. COMPARISON:  CT abdomen and pelvis 10/11/2019. FINDINGS: Lower chest: Small left pleural effusion and basilar airspace disease are again seen. Small right pleural effusion seen on the prior study has resolved. There is mild linear atelectasis or scar in the right base. Hepatobiliary: The liver and biliary tree are unremarkable. Sludge and small stones are seen in the gallbladder but there is no evidence of cholecystitis. Pancreas: Unremarkable. No pancreatic ductal dilatation or surrounding inflammatory changes. Spleen: Normal in size without focal abnormality. Adrenals/Urinary Tract: Adrenal glands are unremarkable. Kidneys are normal, without renal calculi, focal lesion, or hydronephrosis. Bladder is unremarkable. Stomach/Bowel: The patient has a new gastrostomy tube in place. The tube is appropriately positioned. There is mild stranding in the anterior abdominal wall and minimal thickening of the left rectus muscle related to tube placement. Large left paracentral at large left lower quadrant parastomal hernia containing bowel without obstruction or other complicating feature is unchanged. Vascular/Lymphatic: Aortic atherosclerosis. No enlarged abdominal or pelvic lymph nodes. Reproductive: Uterus and bilateral adnexa are unremarkable. Other: None. Musculoskeletal:  No acute or focal abnormality. IMPRESSION: New gastrostomy tube projects in good position. There is mild stranding in subcutaneous fat about the tube and mild thickening of the left rectus muscle consistent with postprocedural change. Small left pleural effusion and basilar airspace  disease which could be atelectasis or pneumonia. Resolved small right pleural effusion. Small gallstones and sludge without evidence of cholecystitis. Aortic Atherosclerosis (ICD10-I70.0). Coronary atherosclerosis also noted. Electronically Signed   By: Inge Rise M.D.   On: 10/24/2019 12:13    Assessment/Plan Active Problems:   Acute on chronic respiratory failure with hypoxia (HCC)   Coronary artery disease involving native coronary artery of native heart without angina pectoris   COPD, severe (HCC)   Chronic HFrEF (heart failure with reduced ejection fraction) ([place])   1. Acute on chronic respiratory failure hypoxia we will continue with T collar trials titrate oxygen continue pulmonary toilet. 2. Coronary artery disease at baseline we will continue to follow 3. Severe COPD at baseline 4. Chronic heart failure reduced ejection fraction at baseline   I have personally seen and evaluated the patient, evaluated laboratory and imaging results, formulated the assessment and plan and placed orders. The Patient requires high complexity decision making with multiple systems involvement.  Rounds were done with the Respiratory Therapy Director and Staff therapists and discussed with nursing staff also.  Allyne Gee, MD [facility] Pulmonary Critical Care Medicine Sleep Medicine

## 2019-10-26 ENCOUNTER — Inpatient Hospital Stay (HOSPITAL_COMMUNITY)
Admission: RE | Admit: 2019-10-26 | Discharge: 2019-11-09 | DRG: 003 | Disposition: A | Discharge status: Other Acute Inpatient Hospital | Payer: Medicare HMO | Source: Ambulatory Visit | Attending: Pulmonary Disease | Admitting: Pulmonary Disease

## 2019-10-26 ENCOUNTER — Inpatient Hospital Stay (HOSPITAL_COMMUNITY): Payer: Medicare HMO

## 2019-10-26 ENCOUNTER — Other Ambulatory Visit (HOSPITAL_COMMUNITY): Payer: Medicare HMO

## 2019-10-26 DIAGNOSIS — R578 Other shock: Secondary | ICD-10-CM | POA: Diagnosis not present

## 2019-10-26 DIAGNOSIS — G9341 Metabolic encephalopathy: Secondary | ICD-10-CM | POA: Diagnosis present

## 2019-10-26 DIAGNOSIS — T17908D Unspecified foreign body in respiratory tract, part unspecified causing other injury, subsequent encounter: Secondary | ICD-10-CM | POA: Diagnosis not present

## 2019-10-26 DIAGNOSIS — I272 Pulmonary hypertension, unspecified: Secondary | ICD-10-CM

## 2019-10-26 DIAGNOSIS — J441 Chronic obstructive pulmonary disease with (acute) exacerbation: Secondary | ICD-10-CM | POA: Diagnosis not present

## 2019-10-26 DIAGNOSIS — I480 Paroxysmal atrial fibrillation: Secondary | ICD-10-CM | POA: Diagnosis present

## 2019-10-26 DIAGNOSIS — G8191 Hemiplegia, unspecified affecting right dominant side: Secondary | ICD-10-CM | POA: Diagnosis not present

## 2019-10-26 DIAGNOSIS — L89153 Pressure ulcer of sacral region, stage 3: Secondary | ICD-10-CM | POA: Diagnosis present

## 2019-10-26 DIAGNOSIS — Y828 Other medical devices associated with adverse incidents: Secondary | ICD-10-CM | POA: Diagnosis present

## 2019-10-26 DIAGNOSIS — C19 Malignant neoplasm of rectosigmoid junction: Secondary | ICD-10-CM | POA: Diagnosis present

## 2019-10-26 DIAGNOSIS — I2584 Coronary atherosclerosis due to calcified coronary lesion: Secondary | ICD-10-CM | POA: Diagnosis present

## 2019-10-26 DIAGNOSIS — J9601 Acute respiratory failure with hypoxia: Secondary | ICD-10-CM | POA: Diagnosis not present

## 2019-10-26 DIAGNOSIS — T17908A Unspecified foreign body in respiratory tract, part unspecified causing other injury, initial encounter: Secondary | ICD-10-CM | POA: Diagnosis present

## 2019-10-26 DIAGNOSIS — R579 Shock, unspecified: Secondary | ICD-10-CM | POA: Diagnosis not present

## 2019-10-26 DIAGNOSIS — J9622 Acute and chronic respiratory failure with hypercapnia: Secondary | ICD-10-CM | POA: Diagnosis present

## 2019-10-26 DIAGNOSIS — E1142 Type 2 diabetes mellitus with diabetic polyneuropathy: Secondary | ICD-10-CM | POA: Diagnosis present

## 2019-10-26 DIAGNOSIS — Z9911 Dependence on respirator [ventilator] status: Secondary | ICD-10-CM

## 2019-10-26 DIAGNOSIS — E1151 Type 2 diabetes mellitus with diabetic peripheral angiopathy without gangrene: Secondary | ICD-10-CM | POA: Diagnosis present

## 2019-10-26 DIAGNOSIS — C3490 Malignant neoplasm of unspecified part of unspecified bronchus or lung: Secondary | ICD-10-CM | POA: Diagnosis present

## 2019-10-26 DIAGNOSIS — G893 Neoplasm related pain (acute) (chronic): Secondary | ICD-10-CM | POA: Diagnosis present

## 2019-10-26 DIAGNOSIS — R000 Tachycardia, unspecified: Secondary | ICD-10-CM | POA: Diagnosis present

## 2019-10-26 DIAGNOSIS — I619 Nontraumatic intracerebral hemorrhage, unspecified: Secondary | ICD-10-CM | POA: Diagnosis not present

## 2019-10-26 DIAGNOSIS — I618 Other nontraumatic intracerebral hemorrhage: Secondary | ICD-10-CM | POA: Diagnosis not present

## 2019-10-26 DIAGNOSIS — J9611 Chronic respiratory failure with hypoxia: Secondary | ICD-10-CM | POA: Diagnosis not present

## 2019-10-26 DIAGNOSIS — I2510 Atherosclerotic heart disease of native coronary artery without angina pectoris: Secondary | ICD-10-CM | POA: Diagnosis not present

## 2019-10-26 DIAGNOSIS — J9811 Atelectasis: Secondary | ICD-10-CM | POA: Diagnosis present

## 2019-10-26 DIAGNOSIS — Z7902 Long term (current) use of antithrombotics/antiplatelets: Secondary | ICD-10-CM

## 2019-10-26 DIAGNOSIS — Z951 Presence of aortocoronary bypass graft: Secondary | ICD-10-CM

## 2019-10-26 DIAGNOSIS — T17998A Other foreign object in respiratory tract, part unspecified causing other injury, initial encounter: Secondary | ICD-10-CM | POA: Diagnosis present

## 2019-10-26 DIAGNOSIS — J9621 Acute and chronic respiratory failure with hypoxia: Secondary | ICD-10-CM | POA: Diagnosis present

## 2019-10-26 DIAGNOSIS — D500 Iron deficiency anemia secondary to blood loss (chronic): Secondary | ICD-10-CM | POA: Diagnosis not present

## 2019-10-26 DIAGNOSIS — Z794 Long term (current) use of insulin: Secondary | ICD-10-CM

## 2019-10-26 DIAGNOSIS — R509 Fever, unspecified: Secondary | ICD-10-CM | POA: Diagnosis not present

## 2019-10-26 DIAGNOSIS — I5022 Chronic systolic (congestive) heart failure: Secondary | ICD-10-CM | POA: Diagnosis not present

## 2019-10-26 DIAGNOSIS — Z885 Allergy status to narcotic agent status: Secondary | ICD-10-CM

## 2019-10-26 DIAGNOSIS — R609 Edema, unspecified: Secondary | ICD-10-CM | POA: Diagnosis not present

## 2019-10-26 DIAGNOSIS — Z955 Presence of coronary angioplasty implant and graft: Secondary | ICD-10-CM

## 2019-10-26 DIAGNOSIS — J690 Pneumonitis due to inhalation of food and vomit: Secondary | ICD-10-CM | POA: Diagnosis present

## 2019-10-26 DIAGNOSIS — Z888 Allergy status to other drugs, medicaments and biological substances status: Secondary | ICD-10-CM

## 2019-10-26 DIAGNOSIS — E872 Acidosis: Secondary | ICD-10-CM | POA: Diagnosis present

## 2019-10-26 DIAGNOSIS — R0902 Hypoxemia: Secondary | ICD-10-CM

## 2019-10-26 DIAGNOSIS — E785 Hyperlipidemia, unspecified: Secondary | ICD-10-CM | POA: Diagnosis present

## 2019-10-26 DIAGNOSIS — Z66 Do not resuscitate: Secondary | ICD-10-CM | POA: Diagnosis present

## 2019-10-26 DIAGNOSIS — J9690 Respiratory failure, unspecified, unspecified whether with hypoxia or hypercapnia: Secondary | ICD-10-CM

## 2019-10-26 DIAGNOSIS — E870 Hyperosmolality and hypernatremia: Secondary | ICD-10-CM | POA: Diagnosis not present

## 2019-10-26 DIAGNOSIS — Z79899 Other long term (current) drug therapy: Secondary | ICD-10-CM

## 2019-10-26 DIAGNOSIS — R0689 Other abnormalities of breathing: Secondary | ICD-10-CM

## 2019-10-26 DIAGNOSIS — I255 Ischemic cardiomyopathy: Secondary | ICD-10-CM | POA: Diagnosis present

## 2019-10-26 DIAGNOSIS — R0489 Hemorrhage from other sites in respiratory passages: Secondary | ICD-10-CM | POA: Diagnosis present

## 2019-10-26 DIAGNOSIS — I611 Nontraumatic intracerebral hemorrhage in hemisphere, cortical: Secondary | ICD-10-CM | POA: Diagnosis not present

## 2019-10-26 DIAGNOSIS — I110 Hypertensive heart disease with heart failure: Secondary | ICD-10-CM | POA: Diagnosis present

## 2019-10-26 DIAGNOSIS — L539 Erythematous condition, unspecified: Secondary | ICD-10-CM | POA: Diagnosis present

## 2019-10-26 DIAGNOSIS — I5021 Acute systolic (congestive) heart failure: Secondary | ICD-10-CM | POA: Diagnosis not present

## 2019-10-26 DIAGNOSIS — I248 Other forms of acute ischemic heart disease: Secondary | ICD-10-CM | POA: Diagnosis not present

## 2019-10-26 DIAGNOSIS — D62 Acute posthemorrhagic anemia: Secondary | ICD-10-CM | POA: Diagnosis present

## 2019-10-26 DIAGNOSIS — J9801 Acute bronchospasm: Secondary | ICD-10-CM | POA: Diagnosis not present

## 2019-10-26 DIAGNOSIS — J9620 Acute and chronic respiratory failure, unspecified whether with hypoxia or hypercapnia: Secondary | ICD-10-CM | POA: Diagnosis not present

## 2019-10-26 DIAGNOSIS — R414 Neurologic neglect syndrome: Secondary | ICD-10-CM | POA: Diagnosis not present

## 2019-10-26 DIAGNOSIS — R29810 Facial weakness: Secondary | ICD-10-CM | POA: Diagnosis not present

## 2019-10-26 DIAGNOSIS — Z20822 Contact with and (suspected) exposure to covid-19: Secondary | ICD-10-CM | POA: Diagnosis present

## 2019-10-26 DIAGNOSIS — E663 Overweight: Secondary | ICD-10-CM | POA: Diagnosis present

## 2019-10-26 DIAGNOSIS — Z9049 Acquired absence of other specified parts of digestive tract: Secondary | ICD-10-CM

## 2019-10-26 DIAGNOSIS — F17210 Nicotine dependence, cigarettes, uncomplicated: Secondary | ICD-10-CM | POA: Diagnosis present

## 2019-10-26 DIAGNOSIS — E876 Hypokalemia: Secondary | ICD-10-CM | POA: Diagnosis not present

## 2019-10-26 DIAGNOSIS — I5042 Chronic combined systolic (congestive) and diastolic (congestive) heart failure: Secondary | ICD-10-CM | POA: Diagnosis present

## 2019-10-26 DIAGNOSIS — N179 Acute kidney failure, unspecified: Secondary | ICD-10-CM | POA: Diagnosis present

## 2019-10-26 DIAGNOSIS — Z452 Encounter for adjustment and management of vascular access device: Secondary | ICD-10-CM

## 2019-10-26 DIAGNOSIS — Z781 Physical restraint status: Secondary | ICD-10-CM

## 2019-10-26 DIAGNOSIS — R042 Hemoptysis: Secondary | ICD-10-CM

## 2019-10-26 DIAGNOSIS — Z6826 Body mass index (BMI) 26.0-26.9, adult: Secondary | ICD-10-CM

## 2019-10-26 DIAGNOSIS — I629 Nontraumatic intracranial hemorrhage, unspecified: Secondary | ICD-10-CM | POA: Diagnosis not present

## 2019-10-26 DIAGNOSIS — Z9289 Personal history of other medical treatment: Secondary | ICD-10-CM

## 2019-10-26 DIAGNOSIS — I610 Nontraumatic intracerebral hemorrhage in hemisphere, subcortical: Secondary | ICD-10-CM | POA: Diagnosis not present

## 2019-10-26 DIAGNOSIS — R451 Restlessness and agitation: Secondary | ICD-10-CM | POA: Diagnosis not present

## 2019-10-26 DIAGNOSIS — I4892 Unspecified atrial flutter: Secondary | ICD-10-CM | POA: Diagnosis present

## 2019-10-26 DIAGNOSIS — Z8249 Family history of ischemic heart disease and other diseases of the circulatory system: Secondary | ICD-10-CM

## 2019-10-26 DIAGNOSIS — Z430 Encounter for attention to tracheostomy: Secondary | ICD-10-CM

## 2019-10-26 DIAGNOSIS — I2582 Chronic total occlusion of coronary artery: Secondary | ICD-10-CM | POA: Diagnosis present

## 2019-10-26 DIAGNOSIS — Z01818 Encounter for other preprocedural examination: Secondary | ICD-10-CM

## 2019-10-26 DIAGNOSIS — T451X5A Adverse effect of antineoplastic and immunosuppressive drugs, initial encounter: Secondary | ICD-10-CM | POA: Diagnosis present

## 2019-10-26 DIAGNOSIS — Z7951 Long term (current) use of inhaled steroids: Secondary | ICD-10-CM

## 2019-10-26 DIAGNOSIS — Z933 Colostomy status: Secondary | ICD-10-CM

## 2019-10-26 DIAGNOSIS — F29 Unspecified psychosis not due to a substance or known physiological condition: Secondary | ICD-10-CM | POA: Diagnosis not present

## 2019-10-26 DIAGNOSIS — Z930 Tracheostomy status: Secondary | ICD-10-CM

## 2019-10-26 DIAGNOSIS — R339 Retention of urine, unspecified: Secondary | ICD-10-CM | POA: Diagnosis not present

## 2019-10-26 DIAGNOSIS — D72829 Elevated white blood cell count, unspecified: Secondary | ICD-10-CM | POA: Diagnosis not present

## 2019-10-26 DIAGNOSIS — Z902 Acquired absence of lung [part of]: Secondary | ICD-10-CM

## 2019-10-26 DIAGNOSIS — I252 Old myocardial infarction: Secondary | ICD-10-CM

## 2019-10-26 LAB — COMPREHENSIVE METABOLIC PANEL
ALT: 34 U/L (ref 0–44)
AST: 78 U/L — ABNORMAL HIGH (ref 15–41)
Albumin: 2.8 g/dL — ABNORMAL LOW (ref 3.5–5.0)
Alkaline Phosphatase: 136 U/L — ABNORMAL HIGH (ref 38–126)
Anion gap: 14 (ref 5–15)
BUN: 35 mg/dL — ABNORMAL HIGH (ref 8–23)
CO2: 30 mmol/L (ref 22–32)
Calcium: 9.8 mg/dL (ref 8.9–10.3)
Chloride: 92 mmol/L — ABNORMAL LOW (ref 98–111)
Creatinine, Ser: 1.16 mg/dL — ABNORMAL HIGH (ref 0.44–1.00)
GFR calc Af Amer: 58 mL/min — ABNORMAL LOW (ref >60)
GFR calc non Af Amer: 50 mL/min — ABNORMAL LOW (ref >60)
Glucose, Bld: 253 mg/dL — ABNORMAL HIGH (ref 70–99)
Potassium: 4.1 mmol/L (ref 3.5–5.1)
Sodium: 136 mmol/L (ref 135–145)
Total Bilirubin: 1.5 mg/dL — ABNORMAL HIGH (ref 0.3–1.2)
Total Protein: 7.2 g/dL (ref 6.5–8.1)

## 2019-10-26 LAB — TYPE AND SCREEN
ABO/RH(D): O POS
Antibody Screen: NEGATIVE

## 2019-10-26 LAB — GLUCOSE, CAPILLARY
Glucose-Capillary: 248 mg/dL — ABNORMAL HIGH (ref 70–99)
Glucose-Capillary: 165 mg/dL — ABNORMAL HIGH (ref 70–99)
Glucose-Capillary: 152 mg/dL — ABNORMAL HIGH (ref 70–99)
Glucose-Capillary: 131 mg/dL — ABNORMAL HIGH (ref 70–99)
Glucose-Capillary: 134 mg/dL — ABNORMAL HIGH (ref 70–99)
Glucose-Capillary: 123 mg/dL — ABNORMAL HIGH (ref 70–99)

## 2019-10-26 LAB — POCT I-STAT 7, (LYTES, BLD GAS, ICA,H+H)
Acid-Base Excess: 4 mmol/L — ABNORMAL HIGH (ref 0.0–2.0)
Acid-Base Excess: 5 mmol/L — ABNORMAL HIGH (ref 0.0–2.0)
Bicarbonate: 33.9 mmol/L — ABNORMAL HIGH (ref 20.0–28.0)
Bicarbonate: 32.7 mmol/L — ABNORMAL HIGH (ref 20.0–28.0)
Calcium, Ion: 1.29 mmol/L (ref 1.15–1.40)
Calcium, Ion: 1.25 mmol/L (ref 1.15–1.40)
HCT: 37 % (ref 36.0–46.0)
HCT: 35 % — ABNORMAL LOW (ref 36.0–46.0)
Hemoglobin: 12.6 g/dL (ref 12.0–15.0)
Hemoglobin: 11.9 g/dL — ABNORMAL LOW (ref 12.0–15.0)
O2 Saturation: 61 %
O2 Saturation: 78 %
Patient temperature: 98.6
Patient temperature: 98.2
Potassium: 3.1 mmol/L — ABNORMAL LOW (ref 3.5–5.1)
Potassium: 3.7 mmol/L (ref 3.5–5.1)
Sodium: 138 mmol/L (ref 135–145)
Sodium: 137 mmol/L (ref 135–145)
TCO2: 36 mmol/L — ABNORMAL HIGH (ref 22–32)
TCO2: 35 mmol/L — ABNORMAL HIGH (ref 22–32)
pCO2 arterial: 81.4 mmHg (ref 32.0–48.0)
pCO2 arterial: 62.9 mmHg — ABNORMAL HIGH (ref 32.0–48.0)
pH, Arterial: 7.228 — ABNORMAL LOW (ref 7.350–7.450)
pH, Arterial: 7.323 — ABNORMAL LOW (ref 7.350–7.450)
pO2, Arterial: 39 mmHg — CL (ref 83.0–108.0)
pO2, Arterial: 47 mmHg — ABNORMAL LOW (ref 83.0–108.0)

## 2019-10-26 LAB — CBC
HCT: 40 % (ref 36.0–46.0)
HCT: 36.7 % (ref 36.0–46.0)
Hemoglobin: 11.4 g/dL — ABNORMAL LOW (ref 12.0–15.0)
Hemoglobin: 10.7 g/dL — ABNORMAL LOW (ref 12.0–15.0)
MCH: 26.6 pg (ref 26.0–34.0)
MCH: 27 pg (ref 26.0–34.0)
MCHC: 28.5 g/dL — ABNORMAL LOW (ref 30.0–36.0)
MCHC: 29.2 g/dL — ABNORMAL LOW (ref 30.0–36.0)
MCV: 93.5 fL (ref 80.0–100.0)
MCV: 92.4 fL (ref 80.0–100.0)
Platelets: 574 10*3/uL — ABNORMAL HIGH (ref 150–400)
Platelets: 565 10*3/uL — ABNORMAL HIGH (ref 150–400)
RBC: 4.28 MIL/uL (ref 3.87–5.11)
RBC: 3.97 MIL/uL (ref 3.87–5.11)
RDW: 16.4 % — ABNORMAL HIGH (ref 11.5–15.5)
RDW: 16.3 % — ABNORMAL HIGH (ref 11.5–15.5)
WBC: 28.4 10*3/uL — ABNORMAL HIGH (ref 4.0–10.5)
WBC: 24.2 10*3/uL — ABNORMAL HIGH (ref 4.0–10.5)
nRBC: 0 % (ref 0.0–0.2)
nRBC: 0 % (ref 0.0–0.2)

## 2019-10-26 LAB — MRSA PCR SCREENING

## 2019-10-26 LAB — APTT: aPTT: 31 seconds (ref 24–36)

## 2019-10-26 LAB — BLOOD GAS, ARTERIAL
Acid-Base Excess: 3.6 mmol/L — ABNORMAL HIGH (ref 0.0–2.0)
Bicarbonate: 32.6 mmol/L — ABNORMAL HIGH (ref 20.0–28.0)
FIO2: 100
O2 Saturation: 86.7 %
Patient temperature: 37
pCO2 arterial: 106 mmHg (ref 32.0–48.0)
pH, Arterial: 7.114 — CL (ref 7.350–7.450)
pO2, Arterial: 68.7 mmHg — ABNORMAL LOW (ref 83.0–108.0)

## 2019-10-26 LAB — LACTIC ACID, PLASMA: Lactic Acid, Venous: 2 mmol/L (ref 0.5–1.9)

## 2019-10-26 LAB — SARS CORONAVIRUS 2 BY RT PCR (HOSPITAL ORDER, PERFORMED IN ~~LOC~~ HOSPITAL LAB): SARS Coronavirus 2: NEGATIVE

## 2019-10-26 LAB — ECHOCARDIOGRAM LIMITED

## 2019-10-26 LAB — ABO/RH: ABO/RH(D): O POS

## 2019-10-26 LAB — HEMOGLOBIN A1C
Hgb A1c MFr Bld: 4.8 % (ref 4.8–5.6)
Mean Plasma Glucose: 91.06 mg/dL

## 2019-10-26 LAB — PROTIME-INR
INR: 1.1 (ref 0.8–1.2)
Prothrombin Time: 13.5 seconds (ref 11.4–15.2)

## 2019-10-26 MED ORDER — FENTANYL CITRATE (PF) 100 MCG/2ML IJ SOLN
INTRAMUSCULAR | Status: AC
  Administered 2019-10-26 (×2): 50 ug
  Filled 2019-10-26: qty 2

## 2019-10-26 MED ORDER — CHLORHEXIDINE GLUCONATE CLOTH 2 % EX PADS
6.0000 | MEDICATED_PAD | Freq: Every day | CUTANEOUS | Status: DC
  Administered 2019-10-27 – 2019-11-09 (×13): 6 via TOPICAL

## 2019-10-26 MED ORDER — DOCUSATE SODIUM 100 MG PO CAPS: 100.0000 mg | ORAL_CAPSULE | Freq: Two times a day (BID) | ORAL | Status: DC | PRN

## 2019-10-26 MED ORDER — DOCUSATE SODIUM 50 MG/5ML PO LIQD
100.0000 mg | Freq: Two times a day (BID) | ORAL | Status: DC
  Administered 2019-10-26: 100 mg via ORAL
  Filled 2019-10-26: qty 10

## 2019-10-26 MED ORDER — PIPERACILLIN-TAZOBACTAM 3.375 G IVPB
3.3750 g | Freq: Three times a day (TID) | INTRAVENOUS | Status: AC
  Administered 2019-10-26 – 2019-11-01 (×20): 3.375 g via INTRAVENOUS
  Filled 2019-10-26 (×20): qty 50

## 2019-10-26 MED ORDER — FENTANYL 2500MCG IN NS 250ML (10MCG/ML) PREMIX INFUSION
0.0000 ug/h | INTRAVENOUS | Status: DC
  Administered 2019-10-26: 175 ug/h via INTRAVENOUS
  Administered 2019-10-26: 100 ug/h via INTRAVENOUS
  Administered 2019-10-27: 25 ug/h via INTRAVENOUS
  Administered 2019-10-27: 100 ug/h via INTRAVENOUS
  Administered 2019-10-28: 200 ug/h via INTRAVENOUS
  Administered 2019-10-28: 75 ug/h via INTRAVENOUS
  Administered 2019-10-29 – 2019-10-30 (×3): 200 ug/h via INTRAVENOUS
  Administered 2019-11-01 – 2019-11-02 (×2): 100 ug/h via INTRAVENOUS
  Filled 2019-10-26 (×13): qty 250

## 2019-10-26 MED ORDER — NOREPINEPHRINE 4 MG/250ML-% IV SOLN
INTRAVENOUS | Status: AC
  Administered 2019-10-26: 14 mg
  Filled 2019-10-26: qty 250

## 2019-10-26 MED ORDER — INSULIN ASPART 100 UNIT/ML ~~LOC~~ SOLN: 2.0000 [IU] | SUBCUTANEOUS | Status: DC

## 2019-10-26 MED ORDER — INSULIN ASPART 100 UNIT/ML ~~LOC~~ SOLN
0.0000 [IU] | SUBCUTANEOUS | Status: DC
  Administered 2019-10-26 (×3): 2 [IU] via SUBCUTANEOUS
  Administered 2019-10-27: 3 [IU] via SUBCUTANEOUS
  Administered 2019-10-27 – 2019-10-28 (×4): 2 [IU] via SUBCUTANEOUS
  Administered 2019-10-28: 3 [IU] via SUBCUTANEOUS
  Administered 2019-10-28 – 2019-10-30 (×6): 2 [IU] via SUBCUTANEOUS
  Administered 2019-10-30: 3 [IU] via SUBCUTANEOUS
  Administered 2019-10-30 (×2): 2 [IU] via SUBCUTANEOUS
  Administered 2019-10-31 (×2): 3 [IU] via SUBCUTANEOUS
  Administered 2019-10-31: 2 [IU] via SUBCUTANEOUS
  Administered 2019-11-01: 3 [IU] via SUBCUTANEOUS
  Administered 2019-11-01 – 2019-11-02 (×5): 2 [IU] via SUBCUTANEOUS
  Administered 2019-11-02: 3 [IU] via SUBCUTANEOUS
  Administered 2019-11-03: 8 [IU] via SUBCUTANEOUS
  Administered 2019-11-03 (×2): 2 [IU] via SUBCUTANEOUS
  Administered 2019-11-04: 3 [IU] via SUBCUTANEOUS
  Administered 2019-11-04 – 2019-11-08 (×7): 2 [IU] via SUBCUTANEOUS
  Administered 2019-11-08: 3 [IU] via SUBCUTANEOUS
  Administered 2019-11-08: 8 [IU] via SUBCUTANEOUS
  Administered 2019-11-09: 5 [IU] via SUBCUTANEOUS
  Administered 2019-11-09 (×2): 3 [IU] via SUBCUTANEOUS

## 2019-10-26 MED ORDER — FENTANYL CITRATE (PF) 100 MCG/2ML IJ SOLN
50.0000 ug | INTRAMUSCULAR | Status: DC | PRN
  Administered 2019-10-26 (×4): 100 ug via INTRAVENOUS
  Administered 2019-10-27: 50 ug via INTRAVENOUS
  Administered 2019-10-28 (×2): 100 ug via INTRAVENOUS
  Administered 2019-10-29: 50 ug via INTRAVENOUS
  Administered 2019-11-03 – 2019-11-04 (×3): 100 ug via INTRAVENOUS
  Filled 2019-10-26 (×5): qty 2
  Filled 2019-10-26: qty 4
  Filled 2019-10-26: qty 2
  Filled 2019-10-26: qty 4

## 2019-10-26 MED ORDER — MIDAZOLAM 50MG/50ML (1MG/ML) PREMIX INFUSION
0.5000 mg/h | INTRAVENOUS | Status: DC
  Administered 2019-10-26: 0.5 mg/h via INTRAVENOUS
  Filled 2019-10-26: qty 50

## 2019-10-26 MED ORDER — PANTOPRAZOLE SODIUM 40 MG IV SOLR
40.0000 mg | Freq: Two times a day (BID) | INTRAVENOUS | Status: DC
  Administered 2019-10-26 – 2019-10-29 (×8): 40 mg via INTRAVENOUS
  Filled 2019-10-26 (×8): qty 40

## 2019-10-26 MED ORDER — DOCUSATE SODIUM 50 MG/5ML PO LIQD
100.0000 mg | Freq: Two times a day (BID) | ORAL | Status: DC
  Administered 2019-10-27 – 2019-11-03 (×14): 100 mg
  Filled 2019-10-26 (×14): qty 10

## 2019-10-26 MED ORDER — VANCOMYCIN HCL 1750 MG/350ML IV SOLN
1750.0000 mg | Freq: Once | INTRAVENOUS | Status: AC
  Administered 2019-10-26: 1750 mg via INTRAVENOUS
  Filled 2019-10-26: qty 350

## 2019-10-26 MED ORDER — VANCOMYCIN HCL 750 MG/150ML IV SOLN
750.0000 mg | Freq: Two times a day (BID) | INTRAVENOUS | Status: DC
  Administered 2019-10-26: 750 mg via INTRAVENOUS
  Filled 2019-10-26 (×3): qty 150

## 2019-10-26 MED ORDER — VASOPRESSIN 20 UNITS/100 ML INFUSION FOR SHOCK
0.0000 [IU]/min | INTRAVENOUS | Status: DC
  Administered 2019-10-26 (×2): 0.03 [IU]/min via INTRAVENOUS
  Filled 2019-10-26 (×3): qty 100

## 2019-10-26 MED ORDER — NOREPINEPHRINE 4 MG/250ML-% IV SOLN
0.0000 ug/min | INTRAVENOUS | Status: DC
  Administered 2019-10-26: 8 ug/min via INTRAVENOUS
  Administered 2019-10-26: 17 ug/min via INTRAVENOUS
  Administered 2019-10-27: 4 ug/min via INTRAVENOUS
  Filled 2019-10-26 (×4): qty 250

## 2019-10-26 MED ORDER — IOHEXOL 350 MG/ML SOLN
100.0000 mL | Freq: Once | INTRAVENOUS | Status: AC | PRN
  Administered 2019-10-26: 100 mL via INTRAVENOUS

## 2019-10-26 MED ORDER — FENTANYL CITRATE (PF) 100 MCG/2ML IJ SOLN
50.0000 ug | INTRAMUSCULAR | Status: AC | PRN
  Administered 2019-10-30 – 2019-11-01 (×3): 50 ug via INTRAVENOUS

## 2019-10-26 MED ORDER — ORAL CARE MOUTH RINSE
15.0000 mL | OROMUCOSAL | Status: DC
  Administered 2019-10-26 – 2019-11-09 (×127): 15 mL via OROMUCOSAL

## 2019-10-26 MED ORDER — POLYETHYLENE GLYCOL 3350 17 G PO PACK
17.0000 g | PACK | Freq: Every day | ORAL | Status: DC
  Administered 2019-10-27 – 2019-10-30 (×2): 17 g via ORAL
  Filled 2019-10-26 (×2): qty 1

## 2019-10-26 MED ORDER — SODIUM CHLORIDE 0.9 % IV SOLN
INTRAVENOUS | Status: DC | PRN
  Administered 2019-10-26: 250 mL via INTRAVENOUS

## 2019-10-26 MED ORDER — PIPERACILLIN-TAZOBACTAM 3.375 G IVPB 30 MIN
3.3750 g | Freq: Once | INTRAVENOUS | Status: AC
  Administered 2019-10-26: 3.375 g via INTRAVENOUS
  Filled 2019-10-26: qty 50

## 2019-10-26 MED ORDER — SODIUM BICARBONATE 8.4 % IV SOLN
INTRAVENOUS | Status: AC
  Administered 2019-10-26: 50 meq
  Filled 2019-10-26: qty 50

## 2019-10-26 MED ORDER — POLYETHYLENE GLYCOL 3350 17 G PO PACK: 17.0000 g | PACK | Freq: Every day | ORAL | Status: DC | PRN

## 2019-10-26 MED ORDER — SODIUM CHLORIDE 0.9 % IV BOLUS
1000.0000 mL | Freq: Once | INTRAVENOUS | Status: AC
  Administered 2019-10-26: 1000 mL via INTRAVENOUS

## 2019-10-26 MED ORDER — CHLORHEXIDINE GLUCONATE 0.12% ORAL RINSE (MEDLINE KIT)
15.0000 mL | Freq: Two times a day (BID) | OROMUCOSAL | Status: DC
  Administered 2019-10-26 – 2019-11-09 (×28): 15 mL via OROMUCOSAL

## 2019-10-26 MED FILL — Medication: Qty: 1 | Status: AC

## 2019-10-26 NOTE — Progress Notes (Signed)
Lower extremity venous has been completed.   Preliminary results in CV Proc.   Abram Sander 10/26/2019 10:59 AM

## 2019-10-26 NOTE — Progress Notes (Signed)
eLink Physician-Brief Progress Note Patient Name: Chelsea Allison DOB: 1955-12-15 MRN: [id number]   Date of Service  10/26/2019  HPI/Events of Note  Patient needs sedation on the ventilator as she is bucking the vent.  eICU Interventions  Versed + Fentanyl infusions ordered.        Kerry Kass [name] 10/26/2019, 6:20 AM

## 2019-10-26 NOTE — Progress Notes (Addendum)
NAME:  Chelsea Allison, MRN:  [id number], DOB:  1955/09/01, LOS: 0 ADMISSION DATE:  10/26/2019, CONSULTATION DATE:  10/26/19 REFERRING MD:  Versie Starks, CHIEF COMPLAINT:  Respiratory failure   Brief History   38 yof admitted at [facility] 6/29-7/28 for acute respiratory failure 2/2 HCAP/COPD exacerbation. She was unable to be weaned off the vent and was eventually trached on 7/6. She was discharged to [facility]. She was readmitted to [facility] on 8/19 for acute on chronic respiratory failure 2/2 hemorrhaging  from her tracheostomy.   History of present illness   64 yo female admitted at [facility] 6/29-7/28 with acute hypoxic respiratory failure 2/2 HCAP. Throughout that admission, she was, unfortunately, unable to be weaned from the vent resulting in tracheostomy that was placed 7/6. On 7/28, she was subsequently transferred to [facility] for ongoing care. Per [facility] staff's report, patient had been having issues with bleeding from her tracheostomy over the 2 days preceding this admission. She developed progressive respiratory failure, resulting in staff calling a Code Blue, however it should be noted that a pulse was not lost. They noted large clots in her tracheostomy tube.  On arrival to pt's room, she was lethargic with O2 sats in the low 60s. Blood pressures remained stable. She was tachycardic in the 120s.  RT noted her being hard to bag. On auscultation, she had absent breath sounds on the left. Anesthesia arrived and attempted needle decompression however did not improve O2 sats. CXR revealed complete white out of the left lung field. Due to significant hemmorrhaging from her tracheostomy site, ENT was called by Dr. Wynetta Emery. They noted that they did not have privledges in [facility] therefore were unable to see the patient. During this time, the on-call provider at [facility] was contacted via telemedicine. After seeing the patient's deteriorating status, they requested transfer to critical care.     Past Medical History  COPD, AoC respiratory failure, CAD, CHF, HTN  Significant Hospital Events   8/19 admitted to PCCM  Consults:  n/a  Procedures:  8/19 intubated 8/19 bronchoscopy [facility] CV[facility] 8/19 >  Significant Diagnostic Tests:  8/19 CXR>>enlarging right pleural effusion with increasing right basilar atelectic changes and subtotal collapse of RML and RLL  8/19 chest x-ray>>Veiling opacity in the right lung favored due to a combination of pleural effusion and atelectasis. Improving ventilation in the left lung since 10/10/2019 with stable residual left upper lung reticulonodular opacity.  8/19 echocardiogram >> EF of 20 to 25% with global akinesis of left ventricle.  Cannot completely exclude mural thrombus at left ventricular apex.  Right ventricular systolic function is mildly reduced with normal size.The inferior vena cava is dilated in size with <50% respiratory  variability, suggesting right atrial pressure of 15 mmHg.    Micro Data:  Blood culture 8/19 >>  Antimicrobials:  Vancomycin 8/19 >> Zosyn 8/19 >>  Interim history/subjective:   Patient is seen at bedside.  She is intubated.  Patient is sedated with minimally responsive to physical and verbal stimulation.  Her trach was removed, no active bleeding noted at the trach site, some mucus secretion oozing out from the site.  She was satting at 93% on 100% FiO2 this morning however she continued to desat to the 80s.  Repeat chest x-ray show improved left lung inflation status post bronchoscopy.   Objective   Blood pressure 122/67, pulse 82, temperature 98.2 F (36.8 C), temperature source Axillary, resp. rate (!) 22, height 5\' 9"  (1.753 m), weight 83.1 kg, SpO2 Marland Kitchen)  80 %.    Vent Mode: PRVC FiO2 (%):  [100 %] 100 % Set Rate:  [28 bmp] 28 bmp Vt Set:  [350 mL] 350 mL PEEP:  [15 cmH20] 15 cmH20 Plateau Pressure:  [30 cmH20] 30 cmH20  No intake or output data in the 24 hours ending 10/26/19 1012 Filed Weights    10/26/19 0900  Weight: 83.1 kg    Physical Exam Constitutional:      Comments: Sedated and minimally responsive to physical and verbal stimulation  HENT:     Head: Normocephalic.     Mouth/Throat:     Comments: Intubated Eyes:     General: No scleral icterus.    Pupils: Pupils are equal, round, and reactive to light.  Cardiovascular:     Rate and Rhythm: Normal rate and regular rhythm.     Heart sounds: Normal heart sounds. No murmur heard.   Pulmonary:     Comments: Course lung sounds bilaterally Abdominal:     General: Bowel sounds are normal. There is no distension.     Palpations: Abdomen is soft.  Musculoskeletal:     Right lower leg: No edema.     Left lower leg: No edema.  Skin:    General: Skin is warm.  Neurological:     Comments: [facility] Problem list   n/a  Assessment & Plan:  Assessment  Acute on chronic hypoxic and hypercarbic respiratory failure secondary to pulmonary hemorrhage and clot  History of severe COPD  Hypotension sepsis vs hemorrhage shock  Leukocytosis/Thrombocytosis likely secondary to pulmonary hemorrhage  AKI  Type 2 diabetes  Heart failure with reduced ejection fraction  History of CAD   Plan:  Acute on chronic hypoxic and hypercarbic respiratory failure Severe COPD Acute metabolic encephalopathy Secondary to pulmonary hemorrhage and clot.  Repeated chest x-ray shows improved left lung inflation status post suction with bronchoscopy.  Patient still hypoxic on FiO2 100%, satting in the low 80s.  Suspecting pulmonary embolism at this point.  Will obtain CTA before starting heparin given history of trach bleeding.  Patient was on Brilinta which is currently held. -PRVC, FiO2 100%, PEEP 15, rate 30, tidal volume 350, MV 10 -ABG 7.22/CO2 81/O2 39/bicarb 33.9 -Continue full vent support -VAP bundle -Pending CTA chest and neck.  Heparin may be consider if confirmed pulmonary embolism -Repeat ABG -Sedation  with fentanyl and Versed as needed -Continue holding Brilinta -Spoken to patient's son about her critical condition.  He verbalized understanding.  He decided to continue current treatment but pursue comfort measures if there is no longer a reversible cause or she has to be on a permanent life-prolonging treatment.   Shock hemorrhagic VS sepsis  Requiring pressor support.  Unclear source of infection at this time. -Continue Levophed and vasopressin to maintain MAP greater than 65 -Pending blood culture -Vancomycin and Zosyn for broad spectrum coverage -Trend fever and WBC curve   Chronic HFrEF History of CAD Echocardiogram 8/19 showed EF of 20 to 25% with global akinesis of left ventricle.  Cannot completely exclude mural thrombus at left ventricular apex.  Right ventricular systolic function is mildly reduced with normal size.The inferior vena cava is dilated in size with <50% respiratory  variability, suggesting right atrial pressure of 15 mmHg.  -Continue to monitor fluid status, diuresis if needed.   Chronic normocytic anemia.  Hgb 11.4 on admission.  Currently 12.6 -CBC daily   Leukocytosis/Thrombocytosis  Likely reactive to pulmonary hemorrhaging versus sepsis -Continue to  monitor  -CBC daily -Trend fever and WBC curve    Acute kidney injury.  Likely 2/2 respiratory acidosis. Electrolytes stable. Continue to monitor -Monitor BMP   T2DM.  Currently holding insulin.  Restart when stable   Best practice:  Diet: NPO Pain/Anxiety/Delirium protocol (if indicated): Fentanyl and Versed VAP protocol (if indicated): yes DVT prophylaxis: hold for active bleeding.  SCD GI prophylaxis: N/A Glucose control: N/A Mobility: BR Code Status: DNR Family Communication: updated sister and son Disposition: ICU for pressor support and ventilator management   Critical care time: 76 minutes   Gaylan Gerold, DO Internal Medicine Residency My pager: [phone]

## 2019-10-26 NOTE — Procedures (Signed)
Bronchoscopy Procedure Note  Chelsea Allison  [id number]  06-11-1955  Date:10/26/19  Time:7:04 AM   Provider Performing:Amzie Sillas Rodman Pickle   Procedure(s):  Initial Therapeutic Aspiration of Tracheobronchial Tree [phone])  Indication(s) Hemoptysis  Consent Unable to obtain consent due to emergent nature of procedure.  Anesthesia Fentanyl   Time Out Verified patient identification, verified procedure, site/side was marked, verified correct patient position, special equipment/implants available, medications/allergies/relevant history reviewed, required imaging and test results available.   Sterile Technique Usual hand hygiene, masks, gowns, and gloves were used   Procedure Description Bronchoscope advanced through endotracheal tube and into airway.  Airways were examined down to subsegmental level with findings noted below.   Following diagnostic evaluation, bloody and frothy secretions and clots and mucous plugs throughout. Large left blood clot in left mainstem. No obvious source of bleed  Complications/Tolerance None; patient tolerated the procedure well. Chest X-ray reviewed  EBL Minimal   Specimen(s) None

## 2019-10-26 NOTE — Procedures (Signed)
Echo has been completed today, please contact echo lab if repeat echo is needed.

## 2019-10-26 NOTE — Progress Notes (Signed)
CRITICAL VALUE ALERT  Critical Value: Lactic   Date & Time Notied:04:55  Provider Notified: Dr. Loanne Drilling   Orders Received/Actions taken: No new orders.

## 2019-10-26 NOTE — Code Documentation (Signed)
EVENT NOTE:  Responded to a page for a code blue in 5E24 ([facility]).   On arrival to the room, staff notes that patient had not lost a pulse however had active hemorrhaging from her tracheostomy causing respiratory failure. RT was at bagging pt at bedside and noted that they were having difficulty doing so. O2 sats were in the low 60s.   On exam, I could not hear lung sounds on the left.   Anesthesia arrived shortly thereafter, and attempted needle decompression without improvement in sats.  CXR was obtained which showed near complete lung collapse on the left.   During this, ENT was called, however noted they would be unable to see the patient as they do not have privileges on [facility].  On call [facility] provider contacted via telemedicine who agreed patient would need transfer to ICU. PCCM was additionally called who also noted patient would need to be discharged from [facility] however due to impending respiratory/cardiac arrest, agreed to see. Due to being too unstable for transfer to ICU/ED, patient was evaluated at bedside by PCCM and subsequently admitted to [facility] on 2M09.  Mitzi Hansen, MD Internal Medicine Resident PGY-2 [facility] Pager: [phone] 10/26/2019 5:48 AM

## 2019-10-26 NOTE — H&P (Addendum)
NAME:  Chelsea Allison, MRN:  [id number], DOB:  10-Nov-1955, LOS: 0 ADMISSION DATE:  10/26/2019, CONSULTATION DATE:  10/26/19 REFERRING MD:  Versie Starks, CHIEF COMPLAINT:  Respiratory failure   Brief History   14 yof admitted at [facility] 6/29-7/28 for acute respiratory failure 2/2 HCAP/COPD exacerbation. She was unable to be weaned off the vent and was eventually trached on 7/6. She was discharged to [facility]. She was readmitted to [facility] on 8/19 for acute on chronic respiratory failure 2/2 hemorrhaging  from her tracheostomy.   History of present illness   64 yo female admitted at [facility] 6/29-7/28 with acute hypoxic respiratory failure 2/2 HCAP. Throughout that admission, she was, unfortunately, unable to be weaned from the vent resulting in tracheostomy that was placed 7/6. On 7/28, she was subsequently transferred to [facility] for ongoing care. Per [facility] staff's report, patient had been having issues with bleeding from her tracheostomy over the 2 days preceding this admission. She developed progressive respiratory failure, resulting in staff calling a Code Blue, however it should be noted that a pulse was not lost. They noted large clots in her tracheostomy tube.  On arrival to pt's room, she was lethargic with O2 sats in the low 60s. Blood pressures remained stable. She was tachycardic in the 120s.  RT noted her being hard to bag. On auscultation, she had absent breath sounds on the left. Anesthesia arrived and attempted needle decompression however did not improve O2 sats. CXR revealed complete white out of the left lung field. Due to significant hemmorrhaging from her tracheostomy site, ENT was called by Dr. Wynetta Emery. They noted that they did not have privledges in [facility] therefore were unable to see the patient. During this time, the on-call provider at [facility] was contacted via telemedicine. After seeing the patient's deteriorating status, they requested transfer to critical care.     Past Medical History  COPD, AoC respiratory failure, CAD, CHF, HTN  Significant Hospital Events   8/19 admitted to PCCM  Consults:  n/a  Procedures:  8/19 intubated 8/19 bronchoscopy [facility] 8/19 >  Significant Diagnostic Tests:  8/19 CXR>>enlarging right pleural effusion with increasing right basilar atelectic changes and subtotal collapse of RML and RLL  Micro Data:  n/a  Antimicrobials:  n/a  Interim history/subjective:  As above  Objective   Blood pressure 102/64, pulse (!) 109, resp. rate (!) 31, SpO2 90 %.    Vent Mode: PRVC FiO2 (%):  [100 %] 100 % Set Rate:  [28 bmp] 28 bmp Vt Set:  [350 mL] 350 mL PEEP:  [15 cmH20] 15 cmH20 Plateau Pressure:  [30 cmH20] 30 cmH20  No intake or output data in the 24 hours ending 10/26/19 0506 There were no vitals filed for this visit.  Examination: General: critically ill appearing female HEENT: tracheostomy site with stigmata of bleeding, but no active external bleeding.  Cardiac: tachycardic. Extremities warm Pulm: absent lung sounds on the left. Diminished. resistance with bagging. Bloody tracheal secretions.  GI: nondistended. Neuro: responsive only to pain.  Skin: periorbital cyanosis   Resolved Hospital Problem list   n/a  Assessment & Plan:   Acute on chronic hypoxic hypercapnic respiratory failure secondary to blood clot plug, or pulmonary hemorrhage. Decanulated and ETT placed. Bronchoscopy with significant bleeding and large clots present.  Left pleural effusion plan -CTA chest when stable. May need IR for embolization pending those results -hold brillinta -continue MV.  -VAP bundle -Trend ABG/CXR  Shock requiring pressor support--hemmorhagic vs sepsis. -  Levophed for MAP goal 38mmHg - Hemoglobin 11.4, no role for transfusion.    Acute hypoxic/metabolic encephalopathy. Given the length of time she has now remained hypoxic, i am concerned for her long term neuroprognosis. Will continue to work to  correct the hypoxia and continue to monitor for now.  - Chemistry pending.   Chronic normocytic anemia. hgb 11.4 on admission.  Leukocytosis/Thrombocytosis likely reactive to pulmonary hemorrhaging. - Continue to monitor Transfuse for hgb<7  Acute kidney injury. Likely 2/2 respiratory acidosis. Electrolytes stable. Continue to monitor  T2DM. Continue lantus + SSI.  Chronic HFrEF. EF 35-40% on echo from June. - Strict I/O.  NSCLC s/p partial lobe resection and radiotherapy History of rectal cancer s/p colectomy Chronic cancer related pain.    Best practice:  Diet: NPO Pain/Anxiety/Delirium protocol (if indicated): yes VAP protocol (if indicated): yes DVT prophylaxis: hold for active bleeding. scd's GI prophylaxis: pepcid Glucose control: lantus + SSI Mobility: BR Code Status: Full Family Communication: updated by [facility] Disposition: ICU  Labs   CBC: Recent Labs  Lab 10/22/19 1111 10/26/19 0315  WBC 8.6 28.4*  HGB 10.5* 11.4*  HCT 35.8* 40.0  MCV 90.6 93.5  PLT 312 574*    Basic Metabolic Panel: Recent Labs  Lab 10/22/19 1111 10/26/19 0315  NA 139 136  K 3.4* 4.1  CL 100 92*  CO2 27 30  GLUCOSE 135* 253*  BUN 24* 35*  CREATININE 0.73 1.16*  CALCIUM 10.0 9.8   GFR: CrCl cannot be calculated (Unknown ideal weight.). Recent Labs  Lab 10/22/19 1111 10/26/19 0315  WBC 8.6 28.4*  LATICACIDVEN  --  2.0*    Liver Function Tests: Recent Labs  Lab 10/26/19 0315  AST 78*  ALT 34  ALKPHOS 136*  BILITOT 1.5*  PROT 7.2  ALBUMIN 2.8*   No results for input(s): LIPASE, AMYLASE in the last 168 hours. No results for input(s): AMMONIA in the last 168 hours.  ABG    Component Value Date/Time   PHART 7.114 (LL) 10/26/2019 0233   PCO2ART 106 (HH) 10/26/2019 0233   PO2ART 68.7 (L) 10/26/2019 0233   HCO3 32.6 (H) 10/26/2019 0233   TCO2 31 09/26/2019 0142   ACIDBASEDEF 1.0 08/15/2019 1944   O2SAT 86.7 10/26/2019 0233     Coagulation Profile: Recent  Labs  Lab 10/26/19 0315  INR 1.1    Cardiac Enzymes: No results for input(s): CKTOTAL, CKMB, CKMBINDEX, TROPONINI in the last 168 hours.  HbA1C: Hgb A1c MFr Bld  Date/Time Value Ref Range Status  08/11/2019 08:00 AM 5.8 (H) 4.8 - 5.6 % Final    Comment:    (NOTE) Pre diabetes:          5.7%-6.4% Diabetes:              >6.4% Glycemic control for   <7.0% adults with diabetes   01/10/2019 04:12 AM 5.7 (H) 4.8 - 5.6 % Final    Comment:    (NOTE) Pre diabetes:          5.7%-6.4% Diabetes:              >6.4% Glycemic control for   <7.0% adults with diabetes     CBG: No results for input(s): GLUCAP in the last 168 hours.  Review of Systems:   Unable to be obtained due to critical illness  Past Medical History  She,  has a past medical history of Acute on chronic respiratory failure with hypoxia ([place]), Acute on chronic respiratory failure with hypoxia (  [place]), CAD (coronary artery disease) 10/20 DES to pRCA, DES mCx, Cancer of lung ([place]), Chronic congestive heart failure ([place]), Chronic HFrEF (heart failure with reduced ejection fraction) ([place]), Colon cancer ([place]), COPD (chronic obstructive pulmonary disease) ([place]), COPD, severe ([place]), COPD, severe ([place]), Coronary artery disease involving native coronary artery of native heart without angina pectoris, Hyperlipidemia, Hypertension, Ischemic cardiomyopathy, Neuropathy, STEMI (ST elevation myocardial infarction) ([place]) (12/12/2018), and Tobacco abuse.   Surgical History    Past Surgical History:  Procedure Laterality Date  . ABDOMINAL SURGERY     For colon cancer  . COLOSTOMY    . CORONARY STENT INTERVENTION N/A 12/20/2018   Procedure: CORONARY STENT INTERVENTION - Right;  Surgeon: Burnell Blanks, MD;  Location: [place] [facility];  Service: Cardiovascular;  Laterality: N/A;  . CORONARY/GRAFT ACUTE MI REVASCULARIZATION N/A 12/12/2018   Procedure: Coronary/Graft Acute MI Revascularization;  Surgeon: Belva Crome, MD;   Location: [place] [facility];  Service: Cardiovascular;  Laterality: N/A;  . IR GASTROSTOMY TUBE MOD SED  10/20/2019  . LEFT HEART CATH AND CORONARY ANGIOGRAPHY N/A 12/12/2018   Procedure: LEFT HEART CATH AND CORONARY ANGIOGRAPHY;  Surgeon: Belva Crome, MD;  Location: [place] [facility];  Service: Cardiovascular;  Laterality: N/A;  . LEFT HEART CATH AND CORONARY ANGIOGRAPHY N/A 08/11/2019   Procedure: LEFT HEART CATH AND CORONARY ANGIOGRAPHY;  Surgeon: Burnell Blanks, MD;  Location: [place] [facility];  Service: Cardiovascular;  Laterality: N/A;     Social History   reports that she has been smoking cigarettes. She has never used smokeless tobacco. She reports that she does not drink alcohol and does not use drugs.   Family History   Her family history includes Hypertension in her father and mother.   Allergies Allergies  Allergen Reactions  . Statins Other (See Comments)  . Hydrochlorothiazide Other (See Comments)    headaches  . Hydrocodone Nausea And Vomiting  . Lisinopril Other (See Comments)    Headache   . Oxycodone Nausea Only     Home Medications  Prior to Admission medications   Medication Sig Start Date End Date Taking? Authorizing Provider  acetaminophen (TYLENOL) 325 MG tablet Place 2 tablets (650 mg total) into feeding tube every 6 (six) hours as needed for mild pain, fever or headache. 10/04/19   Elgergawy, Silver Huguenin, MD  albuterol (PROVENTIL) (2.5 MG/3ML) 0.083% nebulizer solution Take 3 mLs (2.5 mg total) by nebulization every 2 (two) hours as needed for wheezing or shortness of breath. 10/04/19   Elgergawy, Silver Huguenin, MD  arformoterol (BROVANA) 15 MCG/2ML NEBU Take 2 mLs (15 mcg total) by nebulization 2 (two) times daily. 10/04/19   Elgergawy, Silver Huguenin, MD  BREO ELLIPTA 100-25 MCG/INH AEPB Inhale 1 puff into the lungs daily. 08/29/19   Reed, Tiffany L, DO  budesonide (PULMICORT) 0.25 MG/2ML nebulizer solution Take 2 mLs (0.25 mg total) by nebulization 2 (two)  times daily. 10/04/19   Elgergawy, Silver Huguenin, MD  buprenorphine (BUTRANS) 20 MCG/HR PTWK Place 1 patch onto the skin once a week. 08/30/19   [provider]  clonazePAM (KLONOPIN) 0.5 MG tablet Place 1 tablet (0.5 mg total) into feeding tube 2 (two) times daily. 10/04/19   Elgergawy, Silver Huguenin, MD  docusate (COLACE) 50 MG/5ML liquid Place 10 mLs (100 mg total) into feeding tube 2 (two) times daily. 10/04/19   Elgergawy, Silver Huguenin, MD  enoxaparin (LOVENOX) 40 MG/0.4ML injection Inject 0.4 mLs (40 mg total) into the skin daily. 10/05/19  Elgergawy, Silver Huguenin, MD  guaiFENesin (ROBITUSSIN) 100 MG/5ML SOLN Place 15 mLs (300 mg total) into feeding tube every 6 (six) hours. 10/04/19   Elgergawy, Silver Huguenin, MD  insulin aspart (NOVOLOG) 100 UNIT/ML injection Inject 0-15 Units into the skin every 4 (four) hours. 10/04/19   Elgergawy, Silver Huguenin, MD  Nutritional Supplements (FEEDING SUPPLEMENT, VITAL 1.5 CAL,) LIQD Place 1,000 mLs into feeding tube continuous. 60 ml/hour 10/04/19   Elgergawy, Silver Huguenin, MD  oxyCODONE 10 MG TABS Place 0.5 tablets (5 mg total) into feeding tube every 6 (six) hours as needed for severe pain. 10/04/19   Elgergawy, Silver Huguenin, MD  pantoprazole sodium (PROTONIX) 40 mg/20 mL PACK Place 20 mLs (40 mg total) into feeding tube daily. 10/05/19   Elgergawy, Silver Huguenin, MD  polyethylene glycol (MIRALAX / GLYCOLAX) 17 g packet Place 17 g into feeding tube daily. 10/05/19   Elgergawy, Silver Huguenin, MD  QUEtiapine (SEROQUEL) 25 MG tablet Place 1 tablet (25 mg total) into feeding tube 2 (two) times daily. 10/04/19   Elgergawy, Silver Huguenin, MD  revefenacin (YUPELRI) 175 MCG/3ML nebulizer solution Take 3 mLs (175 mcg total) by nebulization daily. 10/05/19   Elgergawy, Silver Huguenin, MD  rosuvastatin (CRESTOR) 20 MG tablet Place 1 tablet (20 mg total) into feeding tube daily. 10/05/19   Elgergawy, Silver Huguenin, MD  ticagrelor (BRILINTA) 90 MG TABS tablet Place 1 tablet (90 mg total) into feeding tube 2 (two) times daily. 10/04/19    Elgergawy, Silver Huguenin, MD  umeclidinium bromide (INCRUSE ELLIPTA) 62.5 MCG/INH AEPB Inhale 1 puff into the lungs daily. 08/29/19   Reed, Tiffany L, DO  vitamin B-12 1000 MCG tablet Place 1 tablet (1,000 mcg total) into feeding tube every other day. 10/05/19   Elgergawy, Silver Huguenin, MD  Vitamin D, Ergocalciferol, (DRISDOL) 1.25 MG (50000 UNIT) CAPS capsule Place 1 capsule (50,000 Units total) into feeding tube every 7 (seven) days. 10/11/19   Elgergawy, Silver Huguenin, MD  Water For Irrigation, Sterile (FREE WATER) SOLN Place 100 mLs into feeding tube every 8 (eight) hours. 10/04/19   Elgergawy, Silver Huguenin, MD     Critical care time [place], AGACNP-BC [place] for personal pager PCCM on call pager [phone]  10/26/2019 6:45 AM

## 2019-10-26 NOTE — Progress Notes (Signed)
  Echocardiogram 2D Echocardiogram has been performed.  Chelsea Allison 10/26/2019, 8:36 AM

## 2019-10-26 NOTE — Procedures (Signed)
Intubation Procedure Note  Chelsea Allison  [id number]  23-Apr-1955  Date:10/26/19  Time:7:33 AM   Provider Performing:Maelle Sheaffer Rodman Pickle    Procedure: Intubation (15953)  Indication(s) Respiratory Failure  Consent Unable to obtain consent due to emergent nature of procedure.   Anesthesia Etomidate and vecuronium   Time Out Verified patient identification, verified procedure, site/side was marked, verified correct patient position, special equipment/implants available, medications/allergies/relevant history reviewed, required imaging and test results available.   Sterile Technique Usual hand hygeine, masks, and gloves were used   Procedure Description Patient positioned in bed supine.  Sedation given as noted above.  Patient was intubated with endotracheal tube using Glidescope.  View was Grade 1 full glottis  with frothy pink secretions.  Number of attempts was 1.  Colorimetric CO2 detector was consistent with tracheal placement.   Complications/Tolerance None; patient tolerated the procedure well. Chest X-ray is ordered to verify placement.   EBL Minimal   Specimen(s) None

## 2019-10-26 NOTE — Progress Notes (Signed)
CT tech called RN to transport patient down for the CT Angio scan, however, patient is not stable at the moment. Dr. Loanne Drilling was present on the unit and was notified.

## 2019-10-26 NOTE — Progress Notes (Addendum)
eLink Physician-Brief Progress Note Patient Name: Chelsea Allison DOB: [birth year] MRN: [id number]   Date of Service  10/26/2019  HPI/Events of Note  Patient transferred from [facility] during the night due to acute respiratory distress linked to bleeding from her tracheostomy site, it required intubation and inflating the cuff beyond the tracheostomy site to isolate the lung from proximal airway hemorrhage. Patient remains hypoxemic and attempts to contact the family have been unsuccessful. Bedside Echo by Dr. Loanne Drilling suggestive of RV failure from possibly hemodynamically significant PE but patient is not a candidate for TPA  given earlier hemorrhage.  eICU Interventions  Will attempt once again to contact the family, 2-D ECHO this a.m. to define hemodynamic subset. CTA  chest to r/o PE and evaluate proximal airway problem.  New Patient Evaluation completed. I called the number for Juliann Pulse trotter, [name]'s  Aunt who is listed as the contact person, it went to voicemail and I left a message and a call back number.        Kerry Kass Deisy Ozbun 10/26/2019, 4:55 AM

## 2019-10-26 NOTE — Procedures (Signed)
Central Venous Catheter Insertion Procedure Note  Chelsea Allison  [id number]  [birth year]  Date:10/26/19  Time:6:39 AM   Provider Performing:Chelsea Allison Chelsea Allison   Procedure: Insertion of Non-tunneled Central Venous [phone]) with US guidance (01314)   Indication(s) Medication administration  Consent Unable to obtain consent due to inability to find a medical decision maker for patient.  All reasonable efforts were made.  Another independent medical provider, Dr. Loanne Allison , confirmed the benefits of this procedure outweigh the risks.  Anesthesia Topical only with 1% lidocaine   Timeout Verified patient identification, verified procedure, site/side was marked, verified correct patient position, special equipment/implants available, medications/allergies/relevant history reviewed, required imaging and test results available.  Sterile Technique Maximal sterile technique including full sterile barrier drape, hand hygiene, sterile gown, sterile gloves, mask, hair covering, sterile ultrasound probe cover (if used).  Procedure Description Area of catheter insertion was cleaned with chlorhexidine and draped in sterile fashion.  With real-time ultrasound guidance a central venous catheter was placed into the left internal jugular vein. Nonpulsatile blood flow and easy flushing noted in all ports.  The catheter was sutured in place and sterile dressing applied.  Complications/Tolerance None; patient tolerated the procedure well. Chest X-ray is ordered to verify placement for internal jugular or subclavian cannulation.   Chest x-ray is not ordered for femoral cannulation.  EBL Minimal  Specimen(s) None   Chelsea Allison, AGACNP-BC Chelsea Allison for personal pager PCCM on call pager [phone]  10/26/2019 6:40 AM

## 2019-10-26 NOTE — Consult Note (Signed)
Pharmacy Antibiotic Note  Chelsea Allison is a 64 y.o. female admitted on 10/26/2019 with suspected sepsis. Pharmacy has been consulted for Vancomycin and Zosyn dosing. Pt is afebrile, presenting from [facility] for acute respiratory failure and hemoptysis, requiring re-intubation and is clinically declining. WBC, SCr, and Lactic acid all increasing.   Plan: Zosyn 3.375g IV q8h (30 minute infusion) x1, followed by Zosyn 3.375g IV q8h (4 hour infusion). Vancomycin 1750 mg IV loading dose, followed by 750 mg IV q12h.  Monitor s/sx of infx, cultures and de-escalate therapy as able. Monitor Scr and assess the need for dose adjustments.    Height: 5\' 9"  (175.3 cm) Weight: 83.1 kg (183 lb 3.2 oz) IBW/kg (Calculated) : 66.2  Temp (24hrs), Avg:98.2 F (36.8 C), Min:98.2 F (36.8 C), Max:98.2 F (36.8 C)  Recent Labs  Lab 10/22/19 1111 10/26/19 0315  WBC 8.6 28.4*  CREATININE 0.73 1.16*  LATICACIDVEN  --  2.0*    Estimated Creatinine Clearance: 57.2 mL/min (A) (by C-G formula based on SCr of 1.16 mg/dL (H)).    Allergies  Allergen Reactions  . Statins Other (See Comments)  . Hydrochlorothiazide Other (See Comments)    headaches  . Hydrocodone Nausea And Vomiting  . Lisinopril Other (See Comments)    Headache   . Oxycodone Nausea Only    Antimicrobials this admission: Vancomycin 8/19 >>  Zosyn 8/19 >>   Dose adjustments this admission: N/A  Microbiology results: 8/19 BCx: Pending  Thank you for allowing pharmacy to be a part of this patient's care.  Chelsea Allison 10/26/2019 9:24 AM

## 2019-10-26 NOTE — Progress Notes (Signed)
PCCM progress note   Patient continues to decline with worsening hypoxia despite maximal vent setting including FIO2 of 100 and Peep of 15. At this time goals of care discussion was held with both patients son and sister. Patients critical status was discussed and both and both verbalized understanding. Decision was made to continue current treatment but to pursue comfort measures if she were to continue to decline.   Johnsie Cancel, NP-C [facility] Contact / Pager information can be found on [facility]  10/26/2019, 9:58 AM

## 2019-10-26 NOTE — Progress Notes (Signed)
Initial Nutrition Assessment  RD working remotely.  DOCUMENTATION CODES:   Not applicable  INTERVENTION:   Tube feeding recommendations for use when medically appropriate: - Vital AF 1.2 @ 55 ml/hr (1320 ml/day) via G-tube - ProSource TF 45 ml TID  Recommended tube feeding regimen provides 1704 kcal, 132 grams of protein, and 1071 ml of H2O.   NUTRITION DIAGNOSIS:   Inadequate oral intake related to inability to eat as evidenced by NPO status.  GOAL:   Provide needs based on ASPEN/SCCM guidelines  MONITOR:   Vent status, Labs, Weight trends, Skin  REASON FOR ASSESSMENT:   Ventilator    ASSESSMENT:   64 year old female who presented from [facility] on 8/19 with acute on chronic respiratory failure secondary to hemorrhaging from tracheostomy. PMH of recent prolonged hospital admission at [facility] for acute hypoxemic respiratory failure secondary to HCAP/COPD exacerbation with difficulty weaning from the vent s/p trach, COPD, CAD, CHF, HTN, NSCLC s/p partial lobe resection and radiotherapy, rectal cancer s/p colectomy.   Per notes, repeat chest xray showing improved left lung infiltration s/p suction with bronchoscopy. Pt with shock requiring pressor support.  Per notes, family wanting to continue with current care but may transition to comfort care if pt's condition deteriorates. RD will leave TF recommendations for use if needed. Pt with G-tube in place.  Per RN edema assessment, pt with moderate pitting generalized edema and moderate pitting edema to BLE.  Patient is currently intubated on ventilator support MV: 10.5 L/min Temp (24hrs), Avg:98.2 F (36.8 C), Min:98.2 F (36.8 C), Max:98.2 F (36.8 C) BP (cuff): 126/62 MAP (cuff): 80  Drips: Fentanyl: 17.5 ml/hr Versed: 0.5 ml/hr Levophed: 63.8 ml/hr Vasopressin: 9 ml/hr  Medications reviewed and include: colace, protonix, miralax, IV abx  Labs reviewed. CBG's: 152-248  NUTRITION - FOCUSED PHYSICAL EXAM:  Unable  to complete at this time. RD working remotely.  Diet Order:   Diet Order            Diet NPO time specified  Diet effective now                 EDUCATION NEEDS:   No education needs have been identified at this time  Skin:  Skin Assessment: Skin Integrity Issues: Stage III: sacrum  Last BM:  10/26/19 colostomy  Height:   Ht Readings from Last 1 Encounters:  10/26/19 5\' 9"  (1.753 m)    Weight:   Wt Readings from Last 1 Encounters:  10/26/19 83.1 kg    Ideal Body Weight:  65.9 kg  BMI:  Body mass index is 27.05 kg/m.  Estimated Nutritional Needs:   Kcal:  1610  Protein:  120-140 grams  Fluid:  >/= 1.8 L    Gaynell Face, MS, RD, LDN Inpatient Clinical Dietitian Please see AMiON for contact information.

## 2019-10-27 ENCOUNTER — Inpatient Hospital Stay (HOSPITAL_COMMUNITY): Payer: Medicare HMO

## 2019-10-27 DIAGNOSIS — J9620 Acute and chronic respiratory failure, unspecified whether with hypoxia or hypercapnia: Secondary | ICD-10-CM

## 2019-10-27 DIAGNOSIS — J9621 Acute and chronic respiratory failure with hypoxia: Secondary | ICD-10-CM

## 2019-10-27 LAB — COMPREHENSIVE METABOLIC PANEL
ALT: 61 U/L — ABNORMAL HIGH (ref 0–44)
AST: 34 U/L (ref 15–41)
Albumin: 2.8 g/dL — ABNORMAL LOW (ref 3.5–5.0)
Alkaline Phosphatase: 123 U/L (ref 38–126)
Anion gap: 8 (ref 5–15)
BUN: 32 mg/dL — ABNORMAL HIGH (ref 8–23)
CO2: 31 mmol/L (ref 22–32)
Calcium: 10 mg/dL (ref 8.9–10.3)
Chloride: 104 mmol/L (ref 98–111)
Creatinine, Ser: 1.01 mg/dL — ABNORMAL HIGH (ref 0.44–1.00)
GFR calc Af Amer: >60 mL/min (ref >60)
GFR calc non Af Amer: 59 mL/min — ABNORMAL LOW (ref >60)
Glucose, Bld: 209 mg/dL — ABNORMAL HIGH (ref 70–99)
Potassium: 4.4 mmol/L (ref 3.5–5.1)
Sodium: 143 mmol/L (ref 135–145)
Total Bilirubin: 0.5 mg/dL (ref 0.3–1.2)
Total Protein: 7 g/dL (ref 6.5–8.1)

## 2019-10-27 LAB — CBC
HCT: 30 % — ABNORMAL LOW (ref 36.0–46.0)
Hemoglobin: 8.7 g/dL — ABNORMAL LOW (ref 12.0–15.0)
MCH: 26.9 pg (ref 26.0–34.0)
MCHC: 29 g/dL — ABNORMAL LOW (ref 30.0–36.0)
MCV: 92.6 fL (ref 80.0–100.0)
Platelets: 375 10*3/uL (ref 150–400)
RBC: 3.24 MIL/uL — ABNORMAL LOW (ref 3.87–5.11)
RDW: 16.4 % — ABNORMAL HIGH (ref 11.5–15.5)
WBC: 10.7 10*3/uL — ABNORMAL HIGH (ref 4.0–10.5)
nRBC: 0 % (ref 0.0–0.2)

## 2019-10-27 LAB — BASIC METABOLIC PANEL
Anion gap: 12 (ref 5–15)
BUN: 42 mg/dL — ABNORMAL HIGH (ref 8–23)
CO2: 26 mmol/L (ref 22–32)
Calcium: 9.2 mg/dL (ref 8.9–10.3)
Chloride: 99 mmol/L (ref 98–111)
Creatinine, Ser: 1.3 mg/dL — ABNORMAL HIGH (ref 0.44–1.00)
GFR calc Af Amer: 51 mL/min — ABNORMAL LOW (ref >60)
GFR calc non Af Amer: 44 mL/min — ABNORMAL LOW (ref >60)
Glucose, Bld: 110 mg/dL — ABNORMAL HIGH (ref 70–99)
Potassium: 3 mmol/L — ABNORMAL LOW (ref 3.5–5.1)
Sodium: 137 mmol/L (ref 135–145)

## 2019-10-27 LAB — LACTIC ACID, PLASMA: Lactic Acid, Venous: 1.2 mmol/L (ref 0.5–1.9)

## 2019-10-27 LAB — IRON AND TIBC
Iron: 24 ug/dL — ABNORMAL LOW (ref 28–170)
Saturation Ratios: 9 % — ABNORMAL LOW (ref 10.4–31.8)
TIBC: 280 ug/dL (ref 250–450)
UIBC: 256 ug/dL

## 2019-10-27 LAB — MRSA PCR SCREENING: MRSA by PCR: NEGATIVE

## 2019-10-27 LAB — PHOSPHORUS
Phosphorus: 2.7 mg/dL (ref 2.5–4.6)
Phosphorus: 4.7 mg/dL — ABNORMAL HIGH (ref 2.5–4.6)

## 2019-10-27 LAB — MAGNESIUM
Magnesium: 2.4 mg/dL (ref 1.7–2.4)
Magnesium: 2.6 mg/dL — ABNORMAL HIGH (ref 1.7–2.4)

## 2019-10-27 LAB — GLUCOSE, CAPILLARY
Glucose-Capillary: 107 mg/dL — ABNORMAL HIGH (ref 70–99)
Glucose-Capillary: 114 mg/dL — ABNORMAL HIGH (ref 70–99)
Glucose-Capillary: 118 mg/dL — ABNORMAL HIGH (ref 70–99)
Glucose-Capillary: 131 mg/dL — ABNORMAL HIGH (ref 70–99)
Glucose-Capillary: 167 mg/dL — ABNORMAL HIGH (ref 70–99)
Glucose-Capillary: 96 mg/dL (ref 70–99)

## 2019-10-27 LAB — FERRITIN: Ferritin: 878 ng/mL — ABNORMAL HIGH (ref 11–307)

## 2019-10-27 LAB — ECHOCARDIOGRAM LIMITED
Height: 69 in
Weight: 2927.71 oz

## 2019-10-27 LAB — CREATININE, URINE, RANDOM: Creatinine, Urine: 76.35 mg/dL

## 2019-10-27 LAB — SODIUM, URINE, RANDOM: Sodium, Ur: 26 mmol/L

## 2019-10-27 MED ORDER — POTASSIUM CHLORIDE 10 MEQ/50ML IV SOLN
10.0000 meq | INTRAVENOUS | Status: AC
  Administered 2019-10-27 (×4): 10 meq via INTRAVENOUS
  Filled 2019-10-27 (×4): qty 50

## 2019-10-27 MED ORDER — PERFLUTREN LIPID MICROSPHERE
1.0000 mL | INTRAVENOUS | Status: AC | PRN
  Administered 2019-10-27: 2 mL via INTRAVENOUS
  Filled 2019-10-27: qty 10

## 2019-10-27 MED ORDER — POTASSIUM CHLORIDE 20 MEQ/15ML (10%) PO SOLN
20.0000 meq | ORAL | Status: AC
  Administered 2019-10-27 (×2): 20 meq
  Filled 2019-10-27 (×2): qty 15

## 2019-10-27 MED ORDER — PROSOURCE TF PO LIQD
45.0000 mL | Freq: Three times a day (TID) | ORAL | Status: DC
  Administered 2019-10-27 – 2019-11-02 (×17): 45 mL
  Filled 2019-10-27 (×15): qty 45

## 2019-10-27 MED ORDER — SODIUM CHLORIDE 0.9% FLUSH
10.0000 mL | Freq: Two times a day (BID) | INTRAVENOUS | Status: DC
  Administered 2019-10-27 – 2019-10-29 (×7): 10 mL
  Administered 2019-10-30: 20 mL
  Administered 2019-10-30 – 2019-11-01 (×4): 10 mL
  Administered 2019-11-01: 20 mL
  Administered 2019-11-02 – 2019-11-03 (×2): 10 mL

## 2019-10-27 MED ORDER — HEPARIN SODIUM (PORCINE) 5000 UNIT/ML IJ SOLN
5000.0000 [IU] | Freq: Three times a day (TID) | INTRAMUSCULAR | Status: DC
  Administered 2019-10-27 – 2019-10-31 (×12): 5000 [IU] via SUBCUTANEOUS
  Filled 2019-10-27 (×12): qty 1

## 2019-10-27 MED ORDER — VITAL AF 1.2 CAL PO LIQD
1000.0000 mL | ORAL | Status: DC
  Administered 2019-10-27 – 2019-11-02 (×7): 1000 mL

## 2019-10-27 MED ORDER — MIDAZOLAM HCL 2 MG/2ML IJ SOLN
INTRAMUSCULAR | Status: AC
  Administered 2019-10-27: 2 mg
  Filled 2019-10-27: qty 2

## 2019-10-27 MED ORDER — SODIUM CHLORIDE 0.9% FLUSH: 10.0000 mL | INTRAVENOUS | Status: DC | PRN

## 2019-10-27 NOTE — Progress Notes (Signed)
  Echocardiogram 2D Echocardiogram has been performed.  Chelsea Allison 10/27/2019, 8:55 AM

## 2019-10-27 NOTE — Progress Notes (Signed)
PCCM interval progress note:  Called to evaluate patient for worsening respiratory status and mental status, patient self-extubated today and was doing ok until a short time ago.  Has copious secretions, pt essentially unresponsive and not protecting her airway.   Spoke with patient's son Gerald Stabs, he stated that he had discussed transitioning to comfort care earlier, but is not ready to do so at this time.  He would like to proceed with intubation.    Otilio Carpen Wess Baney, PA-C

## 2019-10-27 NOTE — Progress Notes (Signed)
Nutrition Follow-up  DOCUMENTATION CODES:   Not applicable  INTERVENTION:   Initiate tube feeds via G-tube: - Vital AF 1.2 @ 55 ml/hr (1320 ml/day) - ProSource TF 45 ml TID  Tube feeding regimen provides 1704 kcal, 132 grams of protein, and 1071 ml of H2O.  NUTRITION DIAGNOSIS:   Inadequate oral intake related to inability to eat as evidenced by NPO status.  Ongoing  GOAL:   Provide needs based on ASPEN/SCCM guidelines  Met via TF  MONITOR:   Vent status, Labs, Weight trends, TF tolerance, Skin, I & O's  REASON FOR ASSESSMENT:   Ventilator    ASSESSMENT:   64-year-old female who presented from [facility] on 8/19 with acute on chronic respiratory failure secondary to hemorrhaging from tracheostomy. PMH of recent prolonged hospital admission at [facility] for acute hypoxemic respiratory failure secondary to HCAP/COPD exacerbation with difficulty weaning from the vent s/p trach, COPD, CAD, CHF, HTN, NSCLC s/p partial lobe resection and radiotherapy, rectal cancer s/p colectomy.  Consult received for TF initiation and management. PEG tube in place. Discussed pt with RN and during ICU rounds. At time of RD assessment pt reporting pain. Information relayed to RN.  Patient is currently intubated on ventilator support MV: 9.5 L/min Temp (24hrs), Avg:98.3 F (36.8 C), Min:98 F (36.7 C), Max:98.8 F (37.1 C) BP (cuff): 121/65 MAP (cuff): 81  Drips: Fentanyl: 10 ml/hr Levophed: 7.5 ml/hr  Medications reviewed and include: colace, SSI q 4 hours, protonix, miralax, IV abx  Labs reviewed: potassium 3.0, hemoglobin 8.7 CBG's: 96-134 x 24 hours  UOP: 1650 ml x 24 hours Colostomy: 90 ml x 24 hours I/O's: +718 ml since admit  NUTRITION - FOCUSED PHYSICAL EXAM:    Most Recent Value  Orbital Region No depletion  Upper Arm Region No depletion  Thoracic and Lumbar Region No depletion  Buccal Region Unable to assess  Temple Region No depletion  Clavicle Bone Region Mild  depletion  Clavicle and Acromion Bone Region Mild depletion  Scapular Bone Region Unable to assess  Dorsal Hand Mild depletion  Patellar Region No depletion  Anterior Thigh Region Mild depletion  Posterior Calf Region Mild depletion  Edema (RD Assessment) None  Hair Reviewed  Eyes Reviewed  Mouth Reviewed  Skin Reviewed  Nails Reviewed       Diet Order:   Diet Order            Diet NPO time specified  Diet effective now                 EDUCATION NEEDS:   No education needs have been identified at this time  Skin:  Skin Assessment: Skin Integrity Issues: Stage III: sacrum  Last BM:  90 ml x 24 hours via colostomy  Height:   Ht Readings from Last 1 Encounters:  10/26/19 5' 9" (1.753 m)    Weight:   Wt Readings from Last 1 Encounters:  10/27/19 83 kg    Ideal Body Weight:  65.9 kg  BMI:  Body mass index is 27.02 kg/m.  Estimated Nutritional Needs:   Kcal:  1674  Protein:  120-140 grams  Fluid:  >/= 1.8 L    Kate Jablonski , MS, RD, LDN Inpatient Clinical Dietitian Please see AMiON for contact information.  

## 2019-10-27 NOTE — Progress Notes (Signed)
Rn reassessing pt, pt has a poor cough and bilateral rhonchi. E-link notified, will continue to monitor.

## 2019-10-27 NOTE — Progress Notes (Signed)
eLink Physician-Brief Progress Note Patient Name: Chelsea Allison DOB: 1955-03-21 MRN: [id number]   Date of Service  10/27/2019  HPI/Events of Note  Urinary retention.  eICU Interventions  In / Out Foley catheterization  Per protocol for urinary retention.        Kerry Kass [name] 10/27/2019, 12:52 AM

## 2019-10-27 NOTE — Progress Notes (Addendum)
eLink Physician-Brief Progress Note Patient Name: Chelsea Allison DOB: 1955/07/21 MRN: [id number]   Date of Service  10/27/2019  HPI/Events of Note  Nursing concerned about respiratory status. Not able to mobilize respiratory secretions even with NT suctioning. Sat = 97% and RR = 24. BP = 141/62 with MAP = 86.   eICU Interventions  Will ask ground team to assess the patient at bedside.      Intervention Category Major Interventions: Hypoxemia - evaluation and management  Chelsea Allison 10/27/2019, 10:18 PM

## 2019-10-27 NOTE — Progress Notes (Signed)
eLink Physician-Brief Progress Note Patient Name: Chelsea Allison DOB: Jul 26, 1955 MRN: [id number]   Date of Service  10/27/2019  HPI/Events of Note  Poor cough - Patient not able to mobilize secretions.   eICU Interventions  Plan: 1. NT suction PRN.      Intervention Category Major Interventions: Hypoxemia - evaluation and management  Magdiel Bartles Eugene 10/27/2019, 9:20 PM

## 2019-10-27 NOTE — Procedures (Signed)
Intubation Procedure Note  Chelsea Allison  [id number]  1955/11/03  Date:10/27/19  Time:11:07 PM   Provider Performing:Dillan Candela R Kenric Ginger    Procedure: Intubation (31500)  Indication(s) Respiratory Failure  Consent Unable to obtain consent due to emergent nature of procedure.   Anesthesia Versed and Fentanyl   Time Out Verified patient identification, verified procedure, site/side was marked, verified correct patient position, special equipment/implants available, medications/allergies/relevant history reviewed, required imaging and test results available.   Sterile Technique Usual hand hygeine, masks, and gloves were used   Procedure Description Patient positioned in bed supine.  Sedation given as noted above.  Patient was intubated with endotracheal tube using Glidescope.  View was Grade 1 full glottis .  Number of attempts was 1.  Colorimetric CO2 detector was consistent with tracheal placement.   Complications/Tolerance None; patient tolerated the procedure well. Chest X-ray is ordered to verify placement.   EBL Minimal   Specimen(s) None   Chelsea Carpen Carianne Taira, PA-C

## 2019-10-27 NOTE — Progress Notes (Signed)
25 ml versed wasted down sink with Meda Klinefelter RN

## 2019-10-27 NOTE — TOC Initial Note (Signed)
Transition of Care [facility]) - Initial/Assessment Note    Patient Details  Name: Chelsea Allison MRN: [id number] Date of Birth: 12/29/1955  [facility] of [facility]) CM/SW Contact:    Verdell Carmine, RN Phone Number: 10/27/2019, 3:42 PM  Clinical Narrative:                 Admitted to ICU for tracheostomy issue, intubated with ETT. Self extubated today, currently on NRB. Received a call from [facility] stating that they have a bed available when patient is medically stable.  Expected Discharge Plan: Long Term Acute Care (LTAC) Barriers to Discharge: Continued Medical Work up   Patient Goals and CMS Choice        Expected Discharge Plan and Services Expected Discharge Plan: [place] (LTAC)                                              Prior Living Arrangements/Services   Lives with:: Facility Resident          Need for Family Participation in Patient Care: Yes (Comment) Care giver support system in place?: Yes (comment)   Criminal Activity/Legal Involvement Pertinent to Current Situation/Hospitalization: No - Comment as needed  Activities of Daily Living      Permission Sought/Granted                  Emotional Assessment         Alcohol / Substance Use: Not Applicable Psych Involvement: No (comment)  Admission diagnosis:  Respiratory distress [R06.03] Aspiration of foreign body in respiratory tract, initial encounter [T17.908A] Patient Active Problem List   Diagnosis Date Noted  . Aspiration of foreign body in respiratory tract 10/26/2019  . Hemoptysis   . Shock ([place])   . Acute on chronic respiratory failure with hypoxia ([place])   . COPD, severe ([place])   . Coronary artery disease involving native coronary artery of native heart without angina pectoris   . Chronic HFrEF (heart failure with reduced ejection fraction) ([place])   . Acute on chronic respiratory failure with hypoxia ([place])   . COPD, severe ([place])   .  Coronary artery disease involving native coronary artery of native heart without angina pectoris   . Chronic congestive heart failure ([place])   . Essential hypertension 09/25/2019  . Dyslipidemia 09/25/2019  . Status post tracheostomy ([place])   . Pressure injury of skin 09/11/2019  . Acute on chronic respiratory failure with hypoxia and hypercapnia ([place]) 09/05/2019  . Ventricular tachycardia ([place]) 08/29/2019  . History of lung cancer in adulthood 08/29/2019  . History of anal cancer 08/29/2019  . Respiratory failure ([place]) 08/11/2019  . CHF exacerbation ([place]) 08/11/2019  . AKI (acute kidney injury) ([place]) 08/11/2019  . NSTEMI (non-ST elevated myocardial infarction) ([place])   . Elevated troponin   . Renal insufficiency   . Chronic combined systolic and diastolic heart failure ([place])   . Respiratory failure with hypoxia and hypercapnia ([place]) 01/08/2019  . Hyperlipidemia 12/20/2018  . Hypertension 12/20/2018  . CAP (community acquired pneumonia) 12/20/2018  . Tobacco abuse 12/20/2018  . CAD in native artery 12/20/2018  . Unstable angina ([place])   . Hypoxemia   . Acute respiratory failure with hypoxemia ([place])   . Acute pulmonary edema (H[facility])   . COPD exacerbation ([place])   . STEMI (ST elevation myocardial infarction) ([place]) 12/12/2018  .  Acute MI, lateral wall ([place]) 12/12/2018  . Acute on chronic respiratory failure with hypercapnia ([place])   . Acute combined systolic and diastolic heart failure ([place])    PCP:  Beckie Salts, MD Pharmacy:   [facility], [place] - [address] [place] [address] Phone: [phone] Fax: [phone]     Social Determinants of Health (SDOH) Interventions    Readmission Risk Interventions Readmission Risk Prevention Plan 01/11/2019 12/20/2018 12/20/2018  Transportation Screening Complete - Complete  PCP or Specialist Appt within 5-7 Days - Complete -  PCP or Specialist Appt within 3-5 Days (No Data) - -  Home Care  Screening - - Complete  Medication Review (RN CM) - - Complete  HRI or Home Care Consult Complete - -  Social Work Consult for [place] Planning/Counseling Complete - -  Palliative Care Screening Not Applicable - -  Medication Review (RN Care Manager) Complete - -  Some recent data might be hidden

## 2019-10-27 NOTE — Progress Notes (Signed)
[facility] ADULT ICU REPLACEMENT PROTOCOL   The patient does apply for the [facility] Adult ICU Electrolyte Replacment Protocol based on the criteria listed below:   1. Is GFR >/= 30 ml/min? Yes.    Patient's GFR today is 44 2. Is SCr </= 2? Yes.   Patient's SCr is 1.30 ml/kg/hr 3. Did SCr increase >/= 0.5 in 24 hours? No. 4. Abnormal electrolyte(s): K+ 3.0 5. Ordered repletion with: Protocol 6. If a panic level lab has been reported, has the CCM MD in charge been notified? Yes.  .   Physician:  Ignacia Marvel 10/27/2019 5:46 AM

## 2019-10-27 NOTE — Progress Notes (Addendum)
Called to pt room due to pt self extubated and being bagged. Upon arrival to pt room pt on NRB and talking to staff, SpO2 95%. No stridor heard at this time, Breath sounds diminished on the right and rhonchi throughout on the left. Pt with weak cough and complains of a sore throat. RT will continue to monitor.

## 2019-10-27 NOTE — Progress Notes (Addendum)
NAME:  Chelsea Allison, MRN:  [id number], DOB:  1955-09-05, LOS: 1 ADMISSION DATE:  10/26/2019, CONSULTATION DATE:  10/26/19 REFERRING MD:  Versie Starks, CHIEF COMPLAINT:  Respiratory failure   Brief History   44 yof admitted at [facility] 6/29-7/28 for acute respiratory failure 2/2 HCAP/COPD exacerbation. She was unable to be weaned off the vent and was eventually trached on 7/6. She was discharged to [facility]. She was readmitted to [facility] on 8/19 for acute on chronic respiratory failure 2/2 hemorrhaging  from her tracheostomy.   History of present illness   64 yo female admitted at [facility] 6/29-7/28 with acute hypoxic respiratory failure 2/2 HCAP. Throughout that admission, she was, unfortunately, unable to be weaned from the vent resulting in tracheostomy that was placed 7/6. On 7/28, she was subsequently transferred to [facility] for ongoing care. Per [facility] staff's report, patient had been having issues with bleeding from her tracheostomy over the 2 days preceding this admission. She developed progressive respiratory failure, resulting in staff calling a Code Blue, however it should be noted that a pulse was not lost. They noted large clots in her tracheostomy tube.  On arrival to pt's room, she was lethargic with O2 sats in the low 60s. Blood pressures remained stable. She was tachycardic in the 120s.  RT noted her being hard to bag. On auscultation, she had absent breath sounds on the left. Anesthesia arrived and attempted needle decompression however did not improve O2 sats. CXR revealed complete white out of the left lung field. Due to significant hemmorrhaging from her tracheostomy site, ENT was called by Dr. Wynetta Emery. They noted that they did not have privledges in [facility] therefore were unable to see the patient. During this time, the on-call provider at [facility] was contacted via telemedicine. After seeing the patient's deteriorating status, they requested transfer to critical care.     Past Medical History  COPD, AoC respiratory failure, CAD, CHF, HTN  Significant Hospital Events   8/19 admitted to PCCM  Consults:  n/a  Procedures:  8/19 intubated 8/19 bronchoscopy [facility] CVL 8/19 >  Significant Diagnostic Tests:  8/19 CXR>>enlarging right pleural effusion with increasing right basilar atelectic changes and subtotal collapse of RML and RLL  8/19 chest x-ray>>Veiling opacity in the right lung favored due to a combination of pleural effusion and atelectasis. Improving ventilation in the left lung since 10/10/2019 with stable residual left upper lung reticulonodular opacity.  8/19 echocardiogram >> EF of 20 to 25% with global akinesis of left ventricle.  Cannot completely exclude mural thrombus at left ventricular apex.  Right ventricular systolic function is mildly reduced with normal size.The inferior vena cava is dilated in size with <50% respiratory  variability, suggesting right atrial pressure of 15 mmHg.   8/20 echocardiogram>> no apical thrombus  Micro Data:  Blood culture 8/19 >> no growth uptoday  Antimicrobials:  Vancomycin 8/19 >> Zosyn 8/19 >>  Interim history/subjective:  No acute event overnight  Patient is seen at bedside.  She is intubated.  She is more awake and alert today.  She is able to follow simple commands.  No acute respiratory distress observed.  Continue to wean off ventilator.   Objective   Blood pressure (!) 100/58, pulse 68, temperature 98.1 F (36.7 C), temperature source Axillary, resp. rate 16, height 5\' 9"  (1.753 m), weight 83 kg, SpO2 94 %.    Vent Mode: PCV FiO2 (%):  [50 %-100 %] 50 % Set Rate:  [28 bmp-30 bmp] 30 bmp Vt  Set:  [350 mL] 350 mL PEEP:  [5 cmH20-15 cmH20] 5 cmH20 Plateau Pressure:  [19 cmH20-20 cmH20] 20 cmH20   Intake/Output Summary (Last 24 hours) at 10/27/2019 1038 Last data filed at 10/27/2019 1000 Gross per 24 hour  Intake 1920.92 ml  Output 1740 ml  Net 180.92 ml   Filed Weights    10/26/19 0900 10/27/19 0400  Weight: 83.1 kg 83 kg    Physical Exam Constitutional:      General: She is not in acute distress.    Comments: Alert and awake  HENT:     Head: Normocephalic.     Mouth/Throat:     Comments: Intubated Eyes:     General: No scleral icterus.       Right eye: No discharge.        Left eye: No discharge.     Conjunctiva/sclera: Conjunctivae normal.  Cardiovascular:     Rate and Rhythm: Normal rate and regular rhythm.     Heart sounds: Normal heart sounds. No murmur heard.   Pulmonary:     Effort: No respiratory distress.     Comments: Course lung sounds bilaterally Abdominal:     General: Bowel sounds are normal. There is no distension.     Palpations: Abdomen is soft.     Tenderness: There is abdominal tenderness (Tenderness to palpation near the PEG tube.  Some brown mucousy discharge noted).     Comments: Colostomy tube in place  Musculoskeletal:     Cervical back: Normal range of motion.     Right lower leg: No edema.     Left lower leg: No edema.  Skin:    General: Skin is warm.     Coloration: Skin is jaundiced.  Neurological:     Mental Status: She is alert.     Comments: Awake and alert Able to follow simple command     Resolved Hospital Problem list   n/a  Assessment & Plan:  Assessment  Acute on chronic hypoxic and hypercarbic respiratory failure secondary to pulmonary hemorrhage and clot  History of severe COPD  Hypotension sepsis vs hemorrhage shock  Leukocytosis/Thrombocytosis likely secondary to pulmonary hemorrhage  AKI  Type 2 diabetes  Heart failure with reduced ejection fraction  History of CAD   Plan:  Acute on chronic hypoxic and hypercarbic respiratory failure Severe COPD Acute metabolic encephalopathy Secondary to pulmonary hemorrhage and clot.  Repeated chest x-ray shows improved left lung inflation status post suction with bronchoscopy.  CTA ruled out pulmonary embolism.  Currently weaned down to  FiO2 of 50%.  We will continue to wean.  Echocardiogram rules out mural thrombus. CXR shows R side atelectasis, will continue chest physiotherapy and pulmonary toilet. -PRVC, FiO2 50%, PEEP 5, rate 30, tidal volume 350, MV 10 -Continue full vent support, continue to wean -VAP bundle --Continue chest physiotherapy and pulmonary toilet.  -Repeat ABG -Sedation with fentanyl  -Continue holding Brilinta -Heparin subq for DVT prophylaxis   Shock hemorrhagic VS sepsis  Possible pneumonia Requiring pressor support.  Unclear source of infection at this time. -Continue Levophed and vasopressin to maintain MAP greater than 65.  Current levo 3 -Pending blood culture -Discontinue vancomycin due to negative MRSA -Zosyn for 7 days to cover for aspiration pneumonia (2/7) -Trend fever and WBC curve   Chronic HFrEF History of CAD Echocardiogram 8/19 showed EF of 20 to 25% with global akinesis of left ventricle.  Right ventricular systolic function is mildly reduced with normal size.The inferior vena cava is  dilated in size with <50% respiratory  variability, suggesting right atrial pressure of 15 mmHg.  Repeat echo ruled out mural thrombus. -Continue to monitor fluid status, diuresis if needed.   Chronic normocytic anemia.  Hgb 11.4 on admission.  Iron saturation was 8 in July and she received Feraheme.  We will recheck iron study -Hemoglobin 11.4-12.6-11.9-10.7-8.7 -CBC daily   Leukocytosis/Thrombocytosis  Likely reactive to pulmonary hemorrhaging versus sepsis improving -Continue to monitor  -CBC daily -Trend fever and WBC curve    Acute kidney injury.  Likely 2/2 respiratory acidosis. Electrolytes stable. Continue to monitor.  Check Fena.  Patient continued to retain urine.  Will need a Foley catheter if she continues to need in and out cath -Creatinine 1.16-1.30 -Monitor BMP   T2DM.  -Resume tube feed -Sliding scale -CBG  Best practice:  Diet: NPO, tube  feed Pain/Anxiety/Delirium protocol (if indicated): Fentanyl  VAP protocol (if indicated): yes DVT prophylaxis: Heparin GI prophylaxis: PPI Glucose control: Sliding scale Mobility: BR Code Status: DNR Family Communication: Continue update family Disposition: ICU for pressor support and ventilator management   Critical care time: 35 minutes   Gaylan Gerold, DO Internal Medicine Residency My pager: [phone]

## 2019-10-28 ENCOUNTER — Inpatient Hospital Stay (HOSPITAL_COMMUNITY): Payer: Medicare HMO

## 2019-10-28 DIAGNOSIS — N179 Acute kidney failure, unspecified: Secondary | ICD-10-CM

## 2019-10-28 DIAGNOSIS — J9622 Acute and chronic respiratory failure with hypercapnia: Secondary | ICD-10-CM

## 2019-10-28 DIAGNOSIS — J690 Pneumonitis due to inhalation of food and vomit: Secondary | ICD-10-CM

## 2019-10-28 DIAGNOSIS — D500 Iron deficiency anemia secondary to blood loss (chronic): Secondary | ICD-10-CM

## 2019-10-28 LAB — CBC
HCT: 28.2 % — ABNORMAL LOW (ref 36.0–46.0)
Hemoglobin: 7.8 g/dL — ABNORMAL LOW (ref 12.0–15.0)
MCH: 26.4 pg (ref 26.0–34.0)
MCHC: 27.7 g/dL — ABNORMAL LOW (ref 30.0–36.0)
MCV: 95.6 fL (ref 80.0–100.0)
Platelets: 371 10*3/uL (ref 150–400)
RBC: 2.95 MIL/uL — ABNORMAL LOW (ref 3.87–5.11)
RDW: 16.4 % — ABNORMAL HIGH (ref 11.5–15.5)
WBC: 10.2 10*3/uL (ref 4.0–10.5)
nRBC: 0 % (ref 0.0–0.2)

## 2019-10-28 LAB — BASIC METABOLIC PANEL
Anion gap: 9 (ref 5–15)
BUN: 34 mg/dL — ABNORMAL HIGH (ref 8–23)
CO2: 30 mmol/L (ref 22–32)
Calcium: 9.6 mg/dL (ref 8.9–10.3)
Chloride: 104 mmol/L (ref 98–111)
Creatinine, Ser: 0.93 mg/dL (ref 0.44–1.00)
GFR calc Af Amer: >60 mL/min (ref >60)
GFR calc non Af Amer: >60 mL/min (ref >60)
Glucose, Bld: 142 mg/dL — ABNORMAL HIGH (ref 70–99)
Potassium: 3.6 mmol/L (ref 3.5–5.1)
Sodium: 143 mmol/L (ref 135–145)

## 2019-10-28 LAB — GLUCOSE, CAPILLARY
Glucose-Capillary: 152 mg/dL — ABNORMAL HIGH (ref 70–99)
Glucose-Capillary: 138 mg/dL — ABNORMAL HIGH (ref 70–99)
Glucose-Capillary: 122 mg/dL — ABNORMAL HIGH (ref 70–99)
Glucose-Capillary: 140 mg/dL — ABNORMAL HIGH (ref 70–99)
Glucose-Capillary: 136 mg/dL — ABNORMAL HIGH (ref 70–99)

## 2019-10-28 LAB — POCT I-STAT 7, (LYTES, BLD GAS, ICA,H+H)
Acid-Base Excess: 5 mmol/L — ABNORMAL HIGH (ref 0.0–2.0)
Bicarbonate: 33.9 mmol/L — ABNORMAL HIGH (ref 20.0–28.0)
Calcium, Ion: 1.41 mmol/L — ABNORMAL HIGH (ref 1.15–1.40)
HCT: 29 % — ABNORMAL LOW (ref 36.0–46.0)
Hemoglobin: 9.9 g/dL — ABNORMAL LOW (ref 12.0–15.0)
O2 Saturation: 100 %
Patient temperature: 99
Potassium: 3.7 mmol/L (ref 3.5–5.1)
Sodium: 144 mmol/L (ref 135–145)
TCO2: 36 mmol/L — ABNORMAL HIGH (ref 22–32)
pCO2 arterial: 76.9 mmHg (ref 32.0–48.0)
pH, Arterial: 7.254 — ABNORMAL LOW (ref 7.350–7.450)
pO2, Arterial: 332 mmHg — ABNORMAL HIGH (ref 83.0–108.0)

## 2019-10-28 LAB — PHOSPHORUS
Phosphorus: 2.2 mg/dL — ABNORMAL LOW (ref 2.5–4.6)
Phosphorus: 2.4 mg/dL — ABNORMAL LOW (ref 2.5–4.6)

## 2019-10-28 LAB — MAGNESIUM
Magnesium: 2.4 mg/dL (ref 1.7–2.4)
Magnesium: 2.2 mg/dL (ref 1.7–2.4)

## 2019-10-28 MED ORDER — MIDAZOLAM HCL (PF) 5 MG/ML IJ SOLN: 1.0000 mg | INTRAMUSCULAR | Status: DC | PRN

## 2019-10-28 MED ORDER — MIDAZOLAM HCL 2 MG/2ML IJ SOLN
1.0000 mg | INTRAMUSCULAR | Status: DC | PRN
  Administered 2019-10-28 (×2): 2 mg via INTRAVENOUS
  Filled 2019-10-28 (×2): qty 2

## 2019-10-28 NOTE — Progress Notes (Signed)
Patient having EKG changes, now not following commands, non-interactive, not responding to pain, and increased work of breathing. E-link called via video. Ground team requested to come evaluate patient, respiratory called. EKG done, will continue to monitor.

## 2019-10-28 NOTE — Progress Notes (Signed)
When hanging new bag of fentanyl at 1529 this RN mistakenly punctured bag with spike.  Informed Charge RN "Helene Kelp" and Pharmacy "[facility]".  Wasted bag of fentanyl in stericycle and documented in pyxis.   Irven [place], RN

## 2019-10-28 NOTE — Progress Notes (Signed)
RT obtained ABG on pt with the following results. CCM Gleason notified of critical PCO2 of 76.9.  Rate increased to 24 at this time. RT will continue to monitor.   PH 7.254 PCO2  76.9 PaO2  332 HCO3  33.9

## 2019-10-28 NOTE — Progress Notes (Signed)
eLink Physician-Brief Progress Note Patient Name: Chelsea Allison DOB: 11/21/1955 MRN: [id number]   Date of Service  10/28/2019  HPI/Events of Note  Agitation   eICU Interventions  Plan: 1. Add Versed 1-2 mg IV Q 2 hours PRN sedation or agitation.      Intervention Category Major Interventions: Delirium, psychosis, severe agitation - evaluation and management  Chelsea Allison 10/28/2019, 4:20 AM

## 2019-10-28 NOTE — Progress Notes (Signed)
NAME:  Chelsea Allison, MRN:  [id number], DOB:  Dec 19, 1955, LOS: 2 ADMISSION DATE:  10/26/2019, CONSULTATION DATE:  10/26/19 REFERRING MD:  Versie Starks, CHIEF COMPLAINT:  Respiratory failure   Brief History   45 yof admitted at [facility] 6/29-7/28 for acute respiratory failure 2/2 HCAP/COPD exacerbation. She was unable to be weaned off the vent and was eventually trached on 7/6. She was discharged to [facility]. She was readmitted to [facility] on 8/19 for acute on chronic respiratory failure 2/2 hemorrhaging  from her tracheostomy.   Past Medical History  COPD, AoC respiratory failure, CAD, CHF, HTN  Significant Hospital Events   8/19 admitted to PCCM  Consults:  n/a  Procedures:  8/19 intubated, 8/20 self extubated, 8/20 reintubated due to respiratory distress. 8/19 bronchoscopy [facility] CVL 8/19 >  Significant Diagnostic Tests:  8/19 CXR>>enlarging right pleural effusion with increasing right basilar atelectic changes and subtotal collapse of RML and RLL  8/19 chest x-ray>>Veiling opacity in the right lung favored due to a combination of pleural effusion and atelectasis. Improving ventilation in the left lung since 10/10/2019 with stable residual left upper lung reticulonodular opacity.  8/19 echocardiogram >> EF of 20 to 25% with global akinesis of left ventricle.  Cannot completely exclude mural thrombus at left ventricular apex.  Right ventricular systolic function is mildly reduced with normal size.The inferior vena cava is dilated in size with <50% respiratory  variability, suggesting right atrial pressure of 15 mmHg.   8/20 echocardiogram>> no apical thrombus  Micro Data:  Blood culture 8/19 >> no growth uptoday  Antimicrobials:  Vancomycin 8/19  Zosyn 8/19 >>  Interim history/subjective:  Patient self extubated yesterday in the afternoon, initially she was doing okay but later in the day she became encephalopathic, unable to protect her airway and started desaturating, she was reintubated last  night. Remains on ventilator since then.  Objective   Blood pressure (!) 106/53, pulse 89, temperature 99.7 F (37.6 C), temperature source Axillary, resp. rate 17, height 5\' 9"  (1.753 m), weight 82.5 kg, SpO2 95 %.    Vent Mode: PCV FiO2 (%):  [40 %-100 %] 40 % Set Rate:  [16 bmp-24 bmp] 24 bmp PEEP:  [5 cmH20] 5 cmH20 Plateau Pressure:  [21 cmH20-31 cmH20] 31 cmH20   Intake/Output Summary (Last 24 hours) at 10/28/2019 1614 Last data filed at 10/28/2019 1547 Gross per 24 hour  Intake 779.25 ml  Output 995 ml  Net -215.75 ml   Filed Weights   10/26/19 0900 10/27/19 0400 10/28/19 0500  Weight: 83.1 kg 83 kg 82.5 kg     Physical exam: General: Chronically ill-appearing female, orally intubated HEAENT: [place]/AT, eyes anicteric.  ETT and OGT in place Neuro: Lethargic, opens eyes with vocal stimuli.  Not following commands.  Moving all 4 extremities. Chest: Crackles heard at the bases right more than left, no wheezes or rhonchi Heart: Regular rate and rhythm, no murmurs or gallops Abdomen: Soft, nontender, nondistended, bowel sounds present Skin: No rash  Resolved Hospital Problem list   Acute kidney injury  Assessment & Plan:  Acute on chronic hypoxic and hypercarbic respiratory failure due to aspiration pneumonia  Hemorrhage from tracheostomy site Severe COPD, not in exacerbation Chronic systolic and diastolic congestive heart failure AKI, improved Hypophosphatemia Acute on chronic anemia due to blood loss from tracheostomy site Acute metabolic encephalopathy due to hypercapnia Coronary artery disease  Patient had to be reintubated last night because of inability to protect her airway and hypoxemia ABG revealed acute on chronic respiratory acidosis  with hypercapnia Vent was adjusted to help clear hypercapnia and improve oxygenation Continue IV Zosyn Patient will require redo tracheostomy once she is clinically improved Monitor intake, output and daily weight Patient  serum creatinine is back to baseline Electrolytes are being supplemented Patient's H&H dropped from 11-8 because of bleeding from tracheostomy site Monitor H&H Continue to hold Brilinta, consider restarting after tracheostomy Continue low-dose fentanyl with RASS goal of 0/-1   Best practice:  Diet: NPO, tube feed Pain/Anxiety/Delirium protocol (if indicated): Fentanyl  VAP protocol (if indicated): yes DVT prophylaxis: Heparin GI prophylaxis: PPI Glucose control: Sliding scale Mobility: BR Code Status: DNR Family Communication: Continue update family Disposition: ICU   Total critical care time: 46 minutes  Performed by: [place] care time was exclusive of separately billable procedures and treating other patients.   Critical care was necessary to treat or prevent imminent or life-threatening deterioration.   Critical care was time spent personally by me on the following activities: development of treatment plan with patient and/or surrogate as well as nursing, discussions with consultants, evaluation of patient's response to treatment, examination of patient, obtaining history from patient or surrogate, ordering and performing treatments and interventions, ordering and review of laboratory studies, ordering and review of radiographic studies, pulse oximetry and re-evaluation of patient's condition.   Jacky Kindle MD Critical care physician [facility]  Pager: [phone] Mobile: [phone]

## 2019-10-28 NOTE — Progress Notes (Signed)
eLink Physician-Brief Progress Note Patient Name: Chelsea Allison DOB: [birth year] MRN: [id number]   Date of Service  10/28/2019  HPI/Events of Note  Agitation - Request for soft bilateral wrist restraints.   eICU Interventions  Will order bilateral soft wrist restraints.      Intervention Category Major Interventions: Delirium, psychosis, severe agitation - evaluation and management  Chelsea Allison 10/28/2019, 12:22 AM

## 2019-10-29 DIAGNOSIS — R042 Hemoptysis: Secondary | ICD-10-CM

## 2019-10-29 LAB — BASIC METABOLIC PANEL
Anion gap: 12 (ref 5–15)
BUN: 30 mg/dL — ABNORMAL HIGH (ref 8–23)
CO2: 30 mmol/L (ref 22–32)
Calcium: 9.6 mg/dL (ref 8.9–10.3)
Chloride: 106 mmol/L (ref 98–111)
Creatinine, Ser: 0.84 mg/dL (ref 0.44–1.00)
GFR calc Af Amer: >60 mL/min (ref >60)
GFR calc non Af Amer: >60 mL/min (ref >60)
Glucose, Bld: 104 mg/dL — ABNORMAL HIGH (ref 70–99)
Potassium: 3.7 mmol/L (ref 3.5–5.1)
Sodium: 148 mmol/L — ABNORMAL HIGH (ref 135–145)

## 2019-10-29 LAB — POCT I-STAT 7, (LYTES, BLD GAS, ICA,H+H)
Acid-Base Excess: 9 mmol/L — ABNORMAL HIGH (ref 0.0–2.0)
Bicarbonate: 34.4 mmol/L — ABNORMAL HIGH (ref 20.0–28.0)
Calcium, Ion: 1.35 mmol/L (ref 1.15–1.40)
HCT: 24 % — ABNORMAL LOW (ref 36.0–46.0)
Hemoglobin: 8.2 g/dL — ABNORMAL LOW (ref 12.0–15.0)
O2 Saturation: 98 %
Patient temperature: 98.6
Potassium: 3.8 mmol/L (ref 3.5–5.1)
Sodium: 148 mmol/L — ABNORMAL HIGH (ref 135–145)
TCO2: 36 mmol/L — ABNORMAL HIGH (ref 22–32)
pCO2 arterial: 51.9 mmHg — ABNORMAL HIGH (ref 32.0–48.0)
pH, Arterial: 7.429 (ref 7.350–7.450)
pO2, Arterial: 110 mmHg — ABNORMAL HIGH (ref 83.0–108.0)

## 2019-10-29 LAB — GLUCOSE, CAPILLARY
Glucose-Capillary: 102 mg/dL — ABNORMAL HIGH (ref 70–99)
Glucose-Capillary: 106 mg/dL — ABNORMAL HIGH (ref 70–99)
Glucose-Capillary: 106 mg/dL — ABNORMAL HIGH (ref 70–99)
Glucose-Capillary: 110 mg/dL — ABNORMAL HIGH (ref 70–99)
Glucose-Capillary: 133 mg/dL — ABNORMAL HIGH (ref 70–99)
Glucose-Capillary: 135 mg/dL — ABNORMAL HIGH (ref 70–99)
Glucose-Capillary: 135 mg/dL — ABNORMAL HIGH (ref 70–99)

## 2019-10-29 LAB — PHOSPHORUS: Phosphorus: 2.7 mg/dL (ref 2.5–4.6)

## 2019-10-29 LAB — CBC
HCT: 26.1 % — ABNORMAL LOW (ref 36.0–46.0)
Hemoglobin: 7.2 g/dL — ABNORMAL LOW (ref 12.0–15.0)
MCH: 26.9 pg (ref 26.0–34.0)
MCHC: 27.6 g/dL — ABNORMAL LOW (ref 30.0–36.0)
MCV: 97.4 fL (ref 80.0–100.0)
Platelets: 366 10*3/uL (ref 150–400)
RBC: 2.68 MIL/uL — ABNORMAL LOW (ref 3.87–5.11)
RDW: 16.8 % — ABNORMAL HIGH (ref 11.5–15.5)
WBC: 9.4 10*3/uL (ref 4.0–10.5)
nRBC: 0.2 % (ref 0.0–0.2)

## 2019-10-29 LAB — MAGNESIUM: Magnesium: 2.5 mg/dL — ABNORMAL HIGH (ref 1.7–2.4)

## 2019-10-29 MED ORDER — FLUTICASONE FUROATE-VILANTEROL 100-25 MCG/INH IN AEPB
1.0000 | INHALATION_SPRAY | Freq: Every day | RESPIRATORY_TRACT | Status: DC
  Filled 2019-10-29: qty 28

## 2019-10-29 MED ORDER — VECURONIUM BROMIDE 10 MG IV SOLR
10.0000 mg | Freq: Once | INTRAVENOUS | Status: AC
  Administered 2019-10-29: 10 mg via INTRAVENOUS
  Filled 2019-10-29: qty 10

## 2019-10-29 MED ORDER — BUDESONIDE 0.25 MG/2ML IN SUSP: 0.2500 mg | Freq: Two times a day (BID) | RESPIRATORY_TRACT | Status: DC

## 2019-10-29 MED ORDER — ETOMIDATE 2 MG/ML IV SOLN
40.0000 mg | Freq: Once | INTRAVENOUS | Status: AC
  Administered 2019-10-29: 20 mg via INTRAVENOUS
  Filled 2019-10-29: qty 20

## 2019-10-29 MED ORDER — ARFORMOTEROL TARTRATE 15 MCG/2ML IN NEBU
15.0000 ug | INHALATION_SOLUTION | Freq: Two times a day (BID) | RESPIRATORY_TRACT | Status: DC
  Administered 2019-10-29 – 2019-11-09 (×21): 15 ug via RESPIRATORY_TRACT
  Filled 2019-10-29 (×24): qty 2

## 2019-10-29 MED ORDER — LIDOCAINE HCL (PF) 2 % IJ SOLN: 0.0000 mL | Freq: Once | INTRAMUSCULAR | Status: DC | PRN

## 2019-10-29 MED ORDER — FENTANYL CITRATE (PF) 100 MCG/2ML IJ SOLN
200.0000 ug | Freq: Once | INTRAMUSCULAR | Status: AC
  Administered 2019-10-29: 75 ug via INTRAVENOUS

## 2019-10-29 MED ORDER — BUDESONIDE 0.25 MG/2ML IN SUSP
0.2500 mg | Freq: Two times a day (BID) | RESPIRATORY_TRACT | Status: DC
  Administered 2019-10-29 – 2019-11-08 (×20): 0.25 mg via RESPIRATORY_TRACT
  Filled 2019-10-29 (×20): qty 2

## 2019-10-29 MED ORDER — MIDAZOLAM HCL 2 MG/2ML IJ SOLN
5.0000 mg | Freq: Once | INTRAMUSCULAR | Status: AC
  Administered 2019-10-29: 2 mg via INTRAVENOUS
  Filled 2019-10-29: qty 6

## 2019-10-29 MED ORDER — LIDOCAINE-EPINEPHRINE (PF) 1 %-1:200000 IJ SOLN
0.0000 mL | Freq: Once | INTRAMUSCULAR | Status: DC | PRN
  Filled 2019-10-29: qty 30

## 2019-10-29 NOTE — Progress Notes (Signed)
RT note- fio2 increased 100% for tracheostomy

## 2019-10-29 NOTE — Progress Notes (Signed)
SLP Cancellation Note  Patient Details Name: Chelsea Allison MRN: [id number] DOB: [birth year]   Cancelled treatment:       Reason Eval/Treat Not Completed: Patient not medically ready. Patient remains orally intubated with plans to re-trach this afternoon. Will plan to f/u 8/23.   Chelsea Favela MA, CCC-SLP     Chelsea Allison Chelsea Allison 10/29/2019, 10:00 AM

## 2019-10-29 NOTE — Progress Notes (Signed)
RT note-Placed back to original vent settings post Trach.

## 2019-10-29 NOTE — Progress Notes (Signed)
eLink Physician-Brief Progress Note Patient Name: Rashad Obeid DOB: 1955-11-04 MRN: [id number]   Date of Service  10/29/2019  HPI/Events of Note  ABG on 40%/PCV 18/Rate 24 /P 5 = 7.429/51.9/110  eICU Interventions  Continue present ventilator management.      Intervention Category Major Interventions: Respiratory failure - evaluation and management  Nissi Doffing Cornelia Copa 10/29/2019, 5:38 AM

## 2019-10-29 NOTE — Progress Notes (Signed)
RT note-changed back to full suport due to increased RR.

## 2019-10-29 NOTE — Procedures (Signed)
Diagnostic Bronchoscopy  Chelsea Allison  [id number]  05-25-1955  Date:10/29/19  Time:3:56 PM   Provider Performing:Cheyenna Pankowski   Procedure: Diagnostic Bronchoscopy (15953)  Indication(s) Assist with direct visualization of tracheostomy placement  Consent Risks of the procedure as well as the alternatives and risks of each were explained to the patient and/or caregiver.  Consent for the procedure was obtained.   Anesthesia See separate tracheostomy note   Time Out Verified patient identification, verified procedure, site/side was marked, verified correct patient position, special equipment/implants available, medications/allergies/relevant history reviewed, required imaging and test results available.   Sterile Technique Usual hand hygiene, masks, gowns, and gloves were used   Procedure Description Bronchoscope advanced through endotracheal tube and into airway.  After suctioning out tracheal secretions, bronchoscope used to provide direct visualization of tracheostomy placement.   Complications/Tolerance None; patient tolerated the procedure well.   EBL None  Specimen(s) None

## 2019-10-29 NOTE — Procedures (Signed)
Percutaneous Tracheostomy Procedure Note   Chelsea Allison  [id number]  1955-06-21  Date:10/29/19  Time:4:09 PM   Provider Performing:Kapri Nero C Tamala Julian  Procedure: Percutaneous Tracheostomy with Bronchoscopic Guidance (31600)  Indication(s) Persistent resp failure  Consent Risks of the procedure as well as the alternatives and risks of each were explained to the patient and/or caregiver.  Consent for the procedure was obtained.  Anesthesia Etomidate, Versed, Fentanyl, Vecuronium   Time Out Verified patient identification, verified procedure, site/side was marked, verified correct patient position, special equipment/implants available, medications/allergies/relevant history reviewed, required imaging and test results available.   Sterile Technique Maximal sterile technique including sterile barrier drape, hand hygiene, sterile gown, sterile gloves, mask, hair covering.    Procedure Description Appropriate anatomy identified by palpation.  Patient's neck prepped and draped in sterile fashion.  1% lidocaine with epinephrine was used to anesthetize skin overlying neck.  1.5cm incision made and blunt dissection performed until tracheal rings could be easily palpated.   Then a size 6-0 Shiley tracheostomy was placed under bronchoscopic visualization using usual Seldinger technique and serial dilation.   Bronchoscope confirmed placement above the carina.  Tracheostomy was sutured in place with adhesive pad to protect skin under pressure.    Patient connected to ventilator.   Complications/Tolerance None; patient tolerated the procedure well. Chest X-ray is ordered to confirm no post-procedural complication.   EBL Minimal   Specimen(s) None

## 2019-10-29 NOTE — Progress Notes (Signed)
NAME:  Chelsea Allison, MRN:  [id number], DOB:  1955/08/14, LOS: 3 ADMISSION DATE:  10/26/2019, CONSULTATION DATE:  10/26/19 REFERRING MD:  Versie Starks, CHIEF COMPLAINT:  Respiratory failure   Brief History   62 yof admitted at [facility] 6/29-7/28 for acute respiratory failure 2/2 HCAP/COPD exacerbation. She was unable to be weaned off the vent and was eventually trached on 7/6. She was discharged to [facility]. She was readmitted to [facility] on 8/19 for acute on chronic respiratory failure 2/2 hemorrhaging  from her tracheostomy.   Past Medical History  COPD, AoC respiratory failure, CAD, CHF, HTN  Significant Hospital Events   8/19 admitted to PCCM  Consults:  n/a  Procedures:  8/19 intubated, 8/20 self extubated, 8/20 reintubated due to respiratory distress. 8/19 bronchoscopy [facility] CVL 8/19 >  Significant Diagnostic Tests:  8/19 CXR>>enlarging right pleural effusion with increasing right basilar atelectic changes and subtotal collapse of RML and RLL  8/19 chest x-ray>>Veiling opacity in the right lung favored due to a combination of pleural effusion and atelectasis. Improving ventilation in the left lung since 10/10/2019 with stable residual left upper lung reticulonodular opacity.  8/19 echocardiogram >> EF of 20 to 25% with global akinesis of left ventricle.  Cannot completely exclude mural thrombus at left ventricular apex.  Right ventricular systolic function is mildly reduced with normal size.The inferior vena cava is dilated in size with <50% respiratory  variability, suggesting right atrial pressure of 15 mmHg.   8/20 echocardiogram>> no apical thrombus  Micro Data:  Blood culture 8/19 >> no growth uptoday  Antimicrobials:  Vancomycin 8/19 - 8/21 Zosyn 8/19 >>  Interim history/subjective:  Patient remained encephalopathic.  She tolerated a spontaneous breathing trial yesterday for 30 minutes  Objective   Blood pressure (!) 116/95, pulse 99, temperature 100.2 F (37.9 C), temperature  source Axillary, resp. rate (!) 24, height 5\' 9"  (1.753 m), weight 78.7 kg, SpO2 93 %.    Vent Mode: PSV;CPAP FiO2 (%):  [40 %] 40 % Set Rate:  [24 bmp] 24 bmp PEEP:  [5 cmH20] 5 cmH20 Pressure Support:  [12 cmH20-14 cmH20] 12 cmH20 Plateau Pressure:  [19 cmH20-20 cmH20] 20 cmH20   Intake/Output Summary (Last 24 hours) at 10/29/2019 1107 Last data filed at 10/29/2019 1000 Gross per 24 hour  Intake 1881.32 ml  Output 1225 ml  Net 656.32 ml   Filed Weights   10/27/19 0400 10/28/19 0500 10/29/19 0259  Weight: 83 kg 82.5 kg 78.7 kg     Physical exam: General: Chronically ill-appearing female, orally intubated HEAENT: /AT, eyes anicteric.  ETT and OGT in place Neuro: Much more alert today, opens eyes with vocal stimuli.  Following simple commands moving all 4 extremities spontaneously. Chest: Coarse breath sounds on the right side, clear air entry on the left, no wheezes or rhonchi Heart: Regular rate and rhythm, no murmurs or gallops Abdomen: Soft, nontender, nondistended, bowel sounds present Skin: No rash  Resolved Hospital Problem list   Acute kidney injury  Assessment & Plan:  Acute on chronic hypoxic and hypercarbic respiratory failure due to aspiration pneumonia  Hemorrhage from tracheostomy site Severe COPD, not in exacerbation Chronic systolic and diastolic congestive heart failure AKI, improved Hypophosphatemia Acute on chronic anemia due to blood loss from tracheostomy site Acute metabolic encephalopathy due to hypercapnia Coronary artery disease  Patient is a scheduled for redo tracheostomy today Continue IV Zosyn Monitor intake, output and daily weight Continue to monitor electrolytes and supplement  Patient's hemoglobin has been dropping slowly, today it  is 7.2.  Transfuse if hemoglobin less than 7 Monitor H&H Continue to hold Brilinta, consider restarting after tracheostomy Continue low-dose fentanyl with RASS goal of 0/-1, avoid deep sedation   Best  practice:  Diet: NPO, tube feed Pain/Anxiety/Delirium protocol (if indicated): Fentanyl  VAP protocol (if indicated): yes DVT prophylaxis: Heparin GI prophylaxis: PPI Glucose control: Sliding scale Mobility: BR Code Status: DNR Family Communication: Continue update family Disposition: ICU   Total critical care time: 32 minutes  Performed by: [place] care time was exclusive of separately billable procedures and treating other patients.   Critical care was necessary to treat or prevent imminent or life-threatening deterioration.   Critical care was time spent personally by me on the following activities: development of treatment plan with patient and/or surrogate as well as nursing, discussions with consultants, evaluation of patient's response to treatment, examination of patient, obtaining history from patient or surrogate, ordering and performing treatments and interventions, ordering and review of laboratory studies, ordering and review of radiographic studies, pulse oximetry and re-evaluation of patient's condition.   Jacky Kindle MD Critical care physician [facility]  Pager: [phone] Mobile: [phone]

## 2019-10-30 DIAGNOSIS — D62 Acute posthemorrhagic anemia: Secondary | ICD-10-CM

## 2019-10-30 DIAGNOSIS — E870 Hyperosmolality and hypernatremia: Secondary | ICD-10-CM

## 2019-10-30 LAB — BASIC METABOLIC PANEL
Anion gap: 9 (ref 5–15)
BUN: 33 mg/dL — ABNORMAL HIGH (ref 8–23)
CO2: 33 mmol/L — ABNORMAL HIGH (ref 22–32)
Calcium: 9.8 mg/dL (ref 8.9–10.3)
Chloride: 108 mmol/L (ref 98–111)
Creatinine, Ser: 0.94 mg/dL (ref 0.44–1.00)
GFR calc Af Amer: >60 mL/min (ref >60)
GFR calc non Af Amer: >60 mL/min (ref >60)
Glucose, Bld: 143 mg/dL — ABNORMAL HIGH (ref 70–99)
Potassium: 4 mmol/L (ref 3.5–5.1)
Sodium: 150 mmol/L — ABNORMAL HIGH (ref 135–145)

## 2019-10-30 LAB — CBC
HCT: 27.7 % — ABNORMAL LOW (ref 36.0–46.0)
Hemoglobin: 7.4 g/dL — ABNORMAL LOW (ref 12.0–15.0)
MCH: 26.8 pg (ref 26.0–34.0)
MCHC: 26.7 g/dL — ABNORMAL LOW (ref 30.0–36.0)
MCV: 100.4 fL — ABNORMAL HIGH (ref 80.0–100.0)
Platelets: 376 10*3/uL (ref 150–400)
RBC: 2.76 MIL/uL — ABNORMAL LOW (ref 3.87–5.11)
RDW: 17.4 % — ABNORMAL HIGH (ref 11.5–15.5)
WBC: 11.7 10*3/uL — ABNORMAL HIGH (ref 4.0–10.5)
nRBC: 0.3 % — ABNORMAL HIGH (ref 0.0–0.2)

## 2019-10-30 LAB — GLUCOSE, CAPILLARY
Glucose-Capillary: 115 mg/dL — ABNORMAL HIGH (ref 70–99)
Glucose-Capillary: 125 mg/dL — ABNORMAL HIGH (ref 70–99)
Glucose-Capillary: 127 mg/dL — ABNORMAL HIGH (ref 70–99)
Glucose-Capillary: 129 mg/dL — ABNORMAL HIGH (ref 70–99)
Glucose-Capillary: 135 mg/dL — ABNORMAL HIGH (ref 70–99)
Glucose-Capillary: 156 mg/dL — ABNORMAL HIGH (ref 70–99)

## 2019-10-30 MED ORDER — ASPIRIN 81 MG PO CHEW
81.0000 mg | CHEWABLE_TABLET | Freq: Every day | ORAL | Status: DC
  Administered 2019-10-30: 81 mg
  Filled 2019-10-30: qty 1

## 2019-10-30 MED ORDER — TICAGRELOR 90 MG PO TABS
90.0000 mg | ORAL_TABLET | Freq: Two times a day (BID) | ORAL | Status: DC
  Administered 2019-10-30 (×2): 90 mg
  Filled 2019-10-30 (×3): qty 1

## 2019-10-30 MED ORDER — POLYETHYLENE GLYCOL 3350 17 G PO PACK: 17.0000 g | PACK | Freq: Every day | ORAL | Status: DC | PRN

## 2019-10-30 MED ORDER — ATORVASTATIN CALCIUM 40 MG PO TABS
40.0000 mg | ORAL_TABLET | Freq: Every day | ORAL | Status: DC
  Administered 2019-10-30 – 2019-11-09 (×11): 40 mg
  Filled 2019-10-30 (×12): qty 1

## 2019-10-30 MED ORDER — QUETIAPINE FUMARATE 50 MG PO TABS: 25.0000 mg | ORAL_TABLET | Freq: Two times a day (BID) | ORAL | Status: DC

## 2019-10-30 MED ORDER — FREE WATER
200.0000 mL | Freq: Four times a day (QID) | Status: DC
  Administered 2019-10-30 – 2019-10-31 (×5): 200 mL

## 2019-10-30 MED ORDER — CLONAZEPAM 0.5 MG PO TABS: 0.5000 mg | ORAL_TABLET | Freq: Two times a day (BID) | ORAL | Status: DC | PRN

## 2019-10-30 MED ORDER — CLONAZEPAM 0.5 MG PO TABS
0.5000 mg | ORAL_TABLET | Freq: Two times a day (BID) | ORAL | Status: DC | PRN
  Administered 2019-10-30 – 2019-11-03 (×2): 0.5 mg
  Filled 2019-10-30 (×2): qty 1

## 2019-10-30 MED ORDER — ACETAMINOPHEN 160 MG/5ML PO SOLN
650.0000 mg | Freq: Four times a day (QID) | ORAL | Status: DC | PRN
  Administered 2019-10-30: 650 mg via ORAL
  Filled 2019-10-30: qty 20.3

## 2019-10-30 MED ORDER — DOCUSATE SODIUM 50 MG/5ML PO LIQD: 100.0000 mg | Freq: Two times a day (BID) | ORAL | Status: DC | PRN

## 2019-10-30 MED ORDER — CHLORHEXIDINE GLUCONATE 0.12 % MT SOLN
OROMUCOSAL | Status: AC
  Filled 2019-10-30: qty 15

## 2019-10-30 MED ORDER — QUETIAPINE FUMARATE 50 MG PO TABS
25.0000 mg | ORAL_TABLET | Freq: Two times a day (BID) | ORAL | Status: DC
  Administered 2019-10-30: 25 mg via ORAL
  Filled 2019-10-30: qty 1

## 2019-10-30 MED ORDER — PANTOPRAZOLE SODIUM 40 MG PO PACK
40.0000 mg | PACK | Freq: Every day | ORAL | Status: DC
  Administered 2019-10-30 – 2019-11-09 (×10): 40 mg
  Filled 2019-10-30 (×12): qty 20

## 2019-10-30 MED ORDER — ACETAMINOPHEN 160 MG/5ML PO SOLN: 650.0000 mg | Freq: Four times a day (QID) | ORAL | Status: DC | PRN

## 2019-10-30 MED ORDER — POLYETHYLENE GLYCOL 3350 17 G PO PACK
17.0000 g | PACK | Freq: Every day | ORAL | Status: DC
  Administered 2019-10-31 – 2019-11-08 (×9): 17 g
  Filled 2019-10-30 (×9): qty 1

## 2019-10-30 MED ORDER — IPRATROPIUM-ALBUTEROL 0.5-2.5 (3) MG/3ML IN SOLN
3.0000 mL | RESPIRATORY_TRACT | Status: DC | PRN
  Administered 2019-10-30 – 2019-11-08 (×8): 3 mL via RESPIRATORY_TRACT
  Filled 2019-10-30 (×8): qty 3

## 2019-10-30 NOTE — Progress Notes (Signed)
NAME:  Chelsea Allison, MRN:  [id number], DOB:  05-Aug-1955, LOS: 4 ADMISSION DATE:  10/26/2019, CONSULTATION DATE:  10/26/19 REFERRING MD:  Versie Starks, CHIEF COMPLAINT:  Respiratory failure   Brief History   51 yof admitted at [facility] 6/29-7/28 for acute respiratory failure 2/2 HCAP/COPD exacerbation. She was unable to be weaned off the vent and was eventually trached on 7/6. She was discharged to [facility]. She was readmitted to [facility] on 8/19 for acute on chronic respiratory failure 2/2 hemorrhaging  from her tracheostomy.   Past Medical History  COPD, AoC respiratory failure, CAD, CHF, HTN  Significant Hospital Events   8/19 admitted to PCCM  Consults:  n/a  Procedures:  8/19 intubated, 8/20 self extubated, 8/20 reintubated due to respiratory distress. 8/19 bronchoscopy [facility] CVL 8/19 >  Significant Diagnostic Tests:  8/19 CXR>>enlarging right pleural effusion with increasing right basilar atelectic changes and subtotal collapse of RML and RLL  8/19 chest x-ray>>Veiling opacity in the right lung favored due to a combination of pleural effusion and atelectasis. Improving ventilation in the left lung since 10/10/2019 with stable residual left upper lung reticulonodular opacity.  8/19 echocardiogram >> EF of 20 to 25% with global akinesis of left ventricle.  Cannot completely exclude mural thrombus at left ventricular apex.  Right ventricular systolic function is mildly reduced with normal size.The inferior vena cava is dilated in size with <50% respiratory  variability, suggesting right atrial pressure of 15 mmHg.   8/20 echocardiogram>> no apical thrombus  Micro Data:  Blood culture 8/19 >> no growth uptoday  Antimicrobials:  Vancomycin 8/19 - 8/21 Zosyn 8/19 >>  Interim history/subjective:  No acute events overnight, RN reports slightly worse confusion this morning.   Objective   Blood pressure (!) 97/50, pulse 86, temperature 99.6 F (37.6 C), temperature source Axillary, resp. rate  18, height 5\' 9"  (1.753 m), weight 81.1 kg, SpO2 98 %.    Vent Mode: PCV FiO2 (%):  [40 %-100 %] 50 % Set Rate:  [24 bmp] 24 bmp PEEP:  [5 cmH20-8 cmH20] 5 cmH20 Pressure Support:  [12 cmH20] 12 cmH20 Plateau Pressure:  [20 cmH20] 20 cmH20   Intake/Output Summary (Last 24 hours) at 10/30/2019 0938 Last data filed at 10/30/2019 0700 Gross per 24 hour  Intake 1883.62 ml  Output 1180 ml  Net 703.62 ml   Filed Weights   10/28/19 0500 10/29/19 0259 10/30/19 0417  Weight: 82.5 kg 78.7 kg 81.1 kg     Physical exam: General: Chronically ill appearing elderly deconditioned female on mechanical ventilation through trach, in NAD HEENT: Trach 6 cuffed shiley, MM pink/moist, PERRL,  Neuro: Alert and able to intermittently follow simple commands, appears slightly more altered this am compared to baseline CV: s1s2 regular rate and rhythm, no murmur, rubs, or gallops,  PULM:  Bilateral wheezing, tolerating pressure control on vent with oxygen saturations 95-99% GI: soft, bowel sounds active in all 4 quadrants, non-tender, non-distended, tolerating TF Extremities: warm/dry, no edema  Skin: no rashes or lesions   Resolved Hospital Problem list   Acute kidney injury  Assessment & Plan:  Acute on chronic hypoxic and hypercarbic respiratory failure due to aspiration pneumonia  Hemorrhage from tracheostomy site Severe COPD, not in exacerbation -[facility] in acute respiratory distress and hemorrhage from airway. She was seen with significant hypoxia  P: Redo tracheostomy completed 8/22 Continue ventilator support with lung protective strategies Vent requirments remains low and she is tolerating pressure control well Attempt transition to ATC today  Wean  PEEP and FiO2 for sats greater than 90%. Head of bed elevated 30 degrees. Plateau pressures less than 30 cm H20.  Ensure adequate pulmonary hygiene  VAP bundle in place  PAD protocol Continue Zosyn, stop date 6/12  Chronic  systolic and diastolic congestive heart failure Coronary artery disease -S/p STEMI with placement of DES 12/2018 on Brillinta, Per cardiology plan is to continue ASA/Brilinta/stain for 1 year P: Close monitoring of I&O Daily weight Volume status appears even Resume Brilinta 8/23   Hypophosphatemia, resolved  Hypernatremia  P: Begin free water flushed Continue to trend Bmet   Acute on chronic anemia due to blood loss from tracheostomy site -Readmitted to P[facility]M on 8/19 for acute on chronic respiratory failure 2/2 hemorrhaging  from her tracheostomy.  P: Hemoglobin remains stable  Monitor H&H Transfuse for hgb less than 7  Acute metabolic encephalopathy due to hypercapnia Concern for development of anoxic injury -Patient is seen more alert morning of 8/23 but she is not at her baseline mentation. She is able to follow simple commands but appear delayed and confused. Prior to this event patient was completely alert and oriented and able to express all needs -Given episode of significant hypoxia after hemorrhage there is concern for anoxic injury   P: Minimize sedation May need to consider SLP cognitive eval  Delirium precautions   Best practice:  Diet: NPO, tube feed Pain/Anxiety/Delirium protocol (if indicated): Fentanyl  VAP protocol (if indicated): yes DVT prophylaxis: Heparin GI prophylaxis: PPI Glucose control: Sliding scale Mobility: BR Code Status: DNR Family Communication: Continue update family Disposition: ICU  CRITICAL CARE Performed by: Johnsie Cancel  Total critical care time: 38 minutes  Critical care time was exclusive of separately billable procedures and treating other patients.  Critical care was necessary to treat or prevent imminent or life-threatening deterioration.  Critical care was time spent personally by me on the following activities: development of treatment plan with patient and/or surrogate as well as nursing, discussions with consultants,  evaluation of patient's response to treatment, examination of patient, obtaining history from patient or surrogate, ordering and performing treatments and interventions, ordering and review of laboratory studies, ordering and review of radiographic studies, pulse oximetry and re-evaluation of patient's condition.  Johnsie Cancel, NP-C [facility] Contact / Pager information can be found on Amion  10/30/2019, 7:35 AM

## 2019-10-30 NOTE — Progress Notes (Signed)
Pts ventilator alarming, RN went into room to find patient restless. RN told patient "I'm going to suction you." Pt then grabbed inline suction and RN arm mouthing "don't kill me. I have a baby." RN reassured her that we were not trying to kill her but that we were trying to help her. Pt refused to release her grip on inline suction and RN. RN called for assistance. Merlene Laughter, NP made aware and bilateral wrist restraints were ordered and placed. Fentanyl bolus given. RN will continue to monitor.

## 2019-10-30 NOTE — Plan of Care (Signed)
  Problem: Clinical Measurements: Goal: Respiratory complications will improve Outcome: Progressing Note: Pt is tolerating new trach well. She is tolerating ventilator well on pressure control.    Problem: Nutrition: Goal: Adequate nutrition will be maintained Outcome: Progressing Note: Pt is tolerating tube feeds well at goal   Problem: Elimination: Goal: Will not experience complications related to bowel motility Outcome: Progressing Goal: Will not experience complications related to urinary retention Outcome: Progressing   Problem: Activity: Goal: Risk for activity intolerance will decrease Outcome: Not Progressing Note: Unable to mobilize patient due to critical illness.

## 2019-10-30 NOTE — Consult Note (Signed)
[place] Nurse Consult Note: Patient receiving care in [place]. Reason for Consult: stage 3 sacrum, colostomy Wound type: 1 cm x 1 cm slightly yellow ulceration approximately 0.1 cm depth on right most sacral area.  This may have been a stage 3 at some point in the past.  If it was, it is significantly improved at this time. Pressure Injury POA: Yes Measurement: Wound bed: Drainage (amount, consistency, odor) none Periwound: intact Dressing procedure/placement/frequency: daily application of a small piece of Xeroform (lawson #[id number]) over the ulcer, then a foam dressing.  The foam dressing can remain in place up to 3 days.  [place] Nurse ostomy follow up Stoma type/location: LUQ colostomy Stomal assessment/size: stoma covered in soft brown stool. Ostomy supplies have to be ordered. Could not determine stoma size Peristomal assessment: deferred Treatment options for stomal/peristomal skin: barrier ring, lawson [phone] Output: soft brown  Ostomy pouching: 1pc. Flat, cut to fit, lawson #[id number]   Monitor the wound area(s) for worsening of condition such as: Signs/symptoms of infection,  Increase in size,  Development of or worsening of odor, Development of pain, or increased pain at the affected locations.  Notify the medical team if any of these develop.  Thank you for the consult.  Discussed plan of care with the bedside nurse.  [place] nurse will not follow at this time.  Please re-consult the [place] team if needed.  Val Riles, RN, MSN, CWOCN, CNS-BC, pager [phone]

## 2019-10-30 NOTE — Progress Notes (Signed)
SLP Cancellation Note  Patient Details Name: Ahmani Daoud MRN: [id number] DOB: [birth year]   Cancelled treatment:         Patient with new tracheostomy 8/22. Orders for SLP eval and treat for PMSV and swallowing received. Will follow pt closely for readiness for SLP interventions as appropriate.     Osie Bond., M.A. [facility] Pager [phone] Office [phone]  10/30/2019, 7:19 AM

## 2019-10-31 ENCOUNTER — Inpatient Hospital Stay (HOSPITAL_COMMUNITY): Payer: Medicare HMO

## 2019-10-31 ENCOUNTER — Encounter (HOSPITAL_COMMUNITY): Payer: Self-pay | Admitting: Interventional Cardiology

## 2019-10-31 DIAGNOSIS — I2510 Atherosclerotic heart disease of native coronary artery without angina pectoris: Secondary | ICD-10-CM

## 2019-10-31 DIAGNOSIS — I629 Nontraumatic intracranial hemorrhage, unspecified: Secondary | ICD-10-CM

## 2019-10-31 DIAGNOSIS — I619 Nontraumatic intracerebral hemorrhage, unspecified: Secondary | ICD-10-CM | POA: Insufficient documentation

## 2019-10-31 LAB — CULTURE, BLOOD (ROUTINE X 2)
Culture: NO GROWTH
Culture: NO GROWTH
Special Requests: ADEQUATE
Special Requests: ADEQUATE

## 2019-10-31 LAB — BASIC METABOLIC PANEL
Anion gap: 11 (ref 5–15)
BUN: 38 mg/dL — ABNORMAL HIGH (ref 8–23)
CO2: 34 mmol/L — ABNORMAL HIGH (ref 22–32)
Calcium: 9.7 mg/dL (ref 8.9–10.3)
Chloride: 106 mmol/L (ref 98–111)
Creatinine, Ser: 0.92 mg/dL (ref 0.44–1.00)
GFR calc Af Amer: >60 mL/min (ref >60)
GFR calc non Af Amer: >60 mL/min (ref >60)
Glucose, Bld: 128 mg/dL — ABNORMAL HIGH (ref 70–99)
Potassium: 3.8 mmol/L (ref 3.5–5.1)
Sodium: 151 mmol/L — ABNORMAL HIGH (ref 135–145)

## 2019-10-31 LAB — GLUCOSE, CAPILLARY
Glucose-Capillary: 117 mg/dL — ABNORMAL HIGH (ref 70–99)
Glucose-Capillary: 111 mg/dL — ABNORMAL HIGH (ref 70–99)
Glucose-Capillary: 169 mg/dL — ABNORMAL HIGH (ref 70–99)
Glucose-Capillary: 169 mg/dL — ABNORMAL HIGH (ref 70–99)
Glucose-Capillary: 136 mg/dL — ABNORMAL HIGH (ref 70–99)

## 2019-10-31 LAB — HEMOGLOBIN AND HEMATOCRIT, BLOOD
HCT: 28.5 % — ABNORMAL LOW (ref 36.0–46.0)
Hemoglobin: 7.8 g/dL — ABNORMAL LOW (ref 12.0–15.0)

## 2019-10-31 LAB — PREPARE RBC (CROSSMATCH)

## 2019-10-31 LAB — CBC
HCT: 24.3 % — ABNORMAL LOW (ref 36.0–46.0)
Hemoglobin: 6.5 g/dL — CL (ref 12.0–15.0)
MCH: 26.5 pg (ref 26.0–34.0)
MCHC: 26.7 g/dL — ABNORMAL LOW (ref 30.0–36.0)
MCV: 99.2 fL (ref 80.0–100.0)
Platelets: 322 10*3/uL (ref 150–400)
RBC: 2.45 MIL/uL — ABNORMAL LOW (ref 3.87–5.11)
RDW: 17.4 % — ABNORMAL HIGH (ref 11.5–15.5)
WBC: 9.2 10*3/uL (ref 4.0–10.5)
nRBC: 0.5 % — ABNORMAL HIGH (ref 0.0–0.2)

## 2019-10-31 MED ORDER — FREE WATER
200.0000 mL | Status: DC
  Administered 2019-10-31 – 2019-11-01 (×6): 200 mL

## 2019-10-31 MED ORDER — CLEVIDIPINE BUTYRATE 0.5 MG/ML IV EMUL
0.0000 mg/h | INTRAVENOUS | Status: DC
  Filled 2019-10-31: qty 50

## 2019-10-31 MED ORDER — PANTOPRAZOLE SODIUM 40 MG IV SOLR: 40.0000 mg | Freq: Every day | INTRAVENOUS | Status: DC

## 2019-10-31 MED ORDER — SODIUM CHLORIDE 0.9% IV SOLUTION: Freq: Once | INTRAVENOUS | Status: DC

## 2019-10-31 MED ORDER — ACETAMINOPHEN 325 MG PO TABS: 650.0000 mg | ORAL_TABLET | ORAL | Status: DC | PRN

## 2019-10-31 MED ORDER — SENNOSIDES-DOCUSATE SODIUM 8.6-50 MG PO TABS
1.0000 | ORAL_TABLET | Freq: Two times a day (BID) | ORAL | Status: DC
  Administered 2019-10-31 – 2019-11-01 (×3): 1 via ORAL
  Filled 2019-10-31 (×3): qty 1

## 2019-10-31 MED ORDER — ACETAMINOPHEN 160 MG/5ML PO SOLN
650.0000 mg | ORAL | Status: DC | PRN
  Administered 2019-11-01 – 2019-11-02 (×3): 650 mg
  Filled 2019-10-31 (×3): qty 20.3

## 2019-10-31 MED ORDER — STROKE: EARLY STAGES OF RECOVERY BOOK
Freq: Once | Status: AC
  Filled 2019-10-31: qty 1

## 2019-10-31 MED ORDER — FUROSEMIDE 10 MG/ML IJ SOLN
40.0000 mg | Freq: Once | INTRAMUSCULAR | Status: AC
  Administered 2019-10-31: 40 mg via INTRAVENOUS
  Filled 2019-10-31: qty 4

## 2019-10-31 MED ORDER — ACETAMINOPHEN 650 MG RE SUPP: 650.0000 mg | RECTAL | Status: DC | PRN

## 2019-10-31 MED ORDER — SODIUM CHLORIDE 0.9 % IV SOLN
0.4000 ug/kg | Freq: Once | INTRAVENOUS | Status: AC
  Administered 2019-10-31: 33.6 ug via INTRAVENOUS
  Filled 2019-10-31: qty 8.4

## 2019-10-31 NOTE — Consult Note (Addendum)
The patient has been seen in conjunction with Fabian Sharp, PA-C. All aspects of care have been considered and discussed. The patient has been personally interviewed, examined, and all clinical data has been reviewed.   Records have been reviewed.  Patient received stenting of the circumflex coronary artery during acute infarction in October 2020 followed by staged PCI of the right coronary 1 week later.  She has multiple significant chronic medical problems including severe COPD, CO2 retention, lung cancer, continued smoking, ischemic cardiomyopathy with systolic heart failure, and had been maintained on dual antiplatelet therapy consisting of aspirin and Brilinta since PCI in October 2020.  In the interval, she has had repeated episodes of acute respiratory failure.  On this occasion she developed code stroke and was found to have an intrathalamic hemorrhage.  Agree with discontinuation of dual antiplatelet therapy and anticoagulation under the current circumstances.  I have reviewed the patient's PCI results from October and feel that when neurology feels it is safe, resumption of therapy should be downgraded to monotherapy with Plavix 75 mg/day.  The patient has been lost to cardiology follow-up since October 2020 admission.  Most likely her dual antiplatelet therapy intensity would have been decreased before now, however she never returned to clinic for follow-up.    Cardiology Consultation:   Patient ID: Chelsea Allison MRN: [id number]; DOB: Dec 06, 1955  Admit date: 10/26/2019 Date of Consult: 10/31/2019  Primary Care Provider: Beckie Salts, MD [facility] Cardiologist: Sinclair Grooms, MD  [facility] Electrophysiologist:  None    Patient Profile:   Braylin Formby is a 64 y.o. female with a hx of CAD s/p STEMI 12/2018, COPD, current smoker, hx of lung cancer, HTN, HLD, ischemic cardiomyopathy, chronic systolic heart failure with EF 35-40%, and hx of colon cancer s/p colostomy  who is being seen today for the evaluation of bleed on DAPT at the request of Dr. Tacy Learn.  History of Present Illness:   Ms. Heese has a history of lateral wall STEMI due to thrombotic occlusion of the LCx and high grade obstruction of the proximal RCA with collaterals to the apical LAD, and chronic total occlusion of the proximal LAD. LCx treated with DES and staged DES to RCA. During her initial intervention on 12/12/18, pt became unresponsive and required intubation and eventual intra-aortic balloon pump. EF at that time was 35-40% with multiple wall motion abnormalities. She was treated for acute PNA and COPD exacerbation and was discharged 12/20/18 on DAPT, BB, and ARB. She was hospitalized and intubated again on 01/08/19 for COPD exacerbation with mild CE elevation felt to be demand ischemia. EF had improve to 45-50%. She presented again 08/2019 with respiratory distress, hypoxia and unresponsiveness requiring BiPAP. Cardiology was consulted and she underwent repeat heart cath for trop over 11k and symptoms concerning for angina. Angiography revealed patent RCA and Cx stents and confirmed 100% prox LAD occlusion. Disease was stable, no intervention. She was continued on DAPT.  It does not appear that she followed up outpatient.   She was admitted 6/29-7/28 for acute respiratory failure secondary to PNA an COPD exacerbation. She did not wean off vent and was ultimately trached on 09/12/19 and discharged to [facility]. She was readmitted 10/26/19 for acute on chronic respiratory failure seondary to hemorrhaging from her trach. She was admitted to [facility] and intubated 8/19. She self-extubated the next day and was reintubated due to respiratory distress. ABG with respiratory acidosis with hyeprcapnia. She is on ABX for HCAP. Echo revealed EF reduced to  20-25%, no apical thrombus. Brilinta placed on hold. She had bronchoscopy on 10/26/19 and redo trach 10/29/19. Brilinta was resumed 10/30/19. Unfortunately, CODE STROKE called  this morning. CT head with left hemorrhagic stroke. Cardiology is now consulted.    Echos reviewed from 8/19 and 8/20: No LV apical thrombus noted EF 25-30% with RWMA  Level 5 Caveat History gathered from chart.    Past Medical History:  Diagnosis Date  . Acute on chronic respiratory failure with hypoxia ([place])   . Acute on chronic respiratory failure with hypoxia ([place])   . CAD (coronary artery disease) 10/20 DES to pRCA, DES mCx   . Cancer of lung ([place])    Twice  . Chronic congestive heart failure ([place])   . Chronic HFrEF (heart failure with reduced ejection fraction) ([place])   . Colon cancer ([place])   . COPD (chronic obstructive pulmonary disease) ([place])   . COPD, severe ([place])   . COPD, severe ([place])   . Coronary artery disease involving native coronary artery of native heart without angina pectoris   . Hyperlipidemia   . Hypertension   . Ischemic cardiomyopathy   . Neuropathy    Secondary to chemo  . STEMI (ST elevation myocardial infarction) ([place]) 12/12/2018  . Tobacco abuse     Past Surgical History:  Procedure Laterality Date  . ABDOMINAL SURGERY     For colon cancer  . COLOSTOMY    . CORONARY STENT INTERVENTION N/A 12/20/2018   Procedure: CORONARY STENT INTERVENTION - Right;  Surgeon: Burnell Blanks, MD;  Location: [place] [facility];  Service: Cardiovascular;  Laterality: N/A;  . CORONARY/GRAFT ACUTE MI REVASCULARIZATION N/A 12/12/2018   Procedure: Coronary/Graft Acute MI Revascularization;  Surgeon: Belva Crome, MD;  Location: [place] [facility];  Service: Cardiovascular;  Laterality: N/A;  . IR GASTROSTOMY TUBE MOD SED  10/20/2019  . LEFT HEART CATH AND CORONARY ANGIOGRAPHY N/A 12/12/2018   Procedure: LEFT HEART CATH AND CORONARY ANGIOGRAPHY;  Surgeon: Belva Crome, MD;  Location: [facility];  Service: Cardiovascular;  Laterality: N/A;  . LEFT HEART CATH AND CORONARY ANGIOGRAPHY N/A 08/11/2019   Procedure: LEFT HEART CATH AND CORONARY ANGIOGRAPHY;   Surgeon: Burnell Blanks, MD;  Location: [place] [facility];  Service: Cardiovascular;  Laterality: N/A;     Home Medications:  Prior to Admission medications   Medication Sig Start Date End Date Taking? Authorizing Provider  ALPRAZolam Duanne Moron) 0.25 MG tablet Take 0.25 mg by mouth at bedtime as needed for anxiety.   Yes [provider]  furosemide (LASIX) 20 MG tablet Take 20 mg by mouth.   Yes [provider]  losartan (COZAAR) 25 MG tablet Take 25 mg by mouth daily.   Yes [provider]  acetaminophen (TYLENOL) 325 MG tablet Place 2 tablets (650 mg total) into feeding tube every 6 (six) hours as needed for mild pain, fever or headache. 10/04/19   Elgergawy, Silver Huguenin, MD  albuterol (PROVENTIL) (2.5 MG/3ML) 0.083% nebulizer solution Take 3 mLs (2.5 mg total) by nebulization every 2 (two) hours as needed for wheezing or shortness of breath. 10/04/19   Elgergawy, Silver Huguenin, MD  arformoterol (BROVANA) 15 MCG/2ML NEBU Take 2 mLs (15 mcg total) by nebulization 2 (two) times daily. 10/04/19   Elgergawy, Silver Huguenin, MD  BREO ELLIPTA 100-25 MCG/INH AEPB Inhale 1 puff into the lungs daily. 08/29/19   Reed, Tiffany L, DO  budesonide (PULMICORT) 0.25 MG/2ML nebulizer solution Take 2 mLs (0.25 mg total)  by nebulization 2 (two) times daily. 10/04/19   Elgergawy, Silver Huguenin, MD  buprenorphine (BUTRANS) 20 MCG/HR PTWK Place 1 patch onto the skin once a week. 08/30/19   [provider]  clonazePAM (KLONOPIN) 0.5 MG tablet Place 1 tablet (0.5 mg total) into feeding tube 2 (two) times daily. 10/04/19   Elgergawy, Silver Huguenin, MD  docusate (COLACE) 50 MG/5ML liquid Place 10 mLs (100 mg total) into feeding tube 2 (two) times daily. 10/04/19   Elgergawy, Silver Huguenin, MD  enoxaparin (LOVENOX) 40 MG/0.4ML injection Inject 0.4 mLs (40 mg total) into the skin daily. 10/05/19   Elgergawy, Silver Huguenin, MD  guaiFENesin (ROBITUSSIN) 100 MG/5ML SOLN Place 15 mLs (300 mg total) into feeding tube every 6  (six) hours. 10/04/19   Elgergawy, Silver Huguenin, MD  insulin aspart (NOVOLOG) 100 UNIT/ML injection Inject 0-15 Units into the skin every 4 (four) hours. 10/04/19   Elgergawy, Silver Huguenin, MD  Nutritional Supplements (FEEDING SUPPLEMENT, VITAL 1.5 CAL,) LIQD Place 1,000 mLs into feeding tube continuous. 60 ml/hour 10/04/19   Elgergawy, Silver Huguenin, MD  oxyCODONE 10 MG TABS Place 0.5 tablets (5 mg total) into feeding tube every 6 (six) hours as needed for severe pain. 10/04/19   Elgergawy, Silver Huguenin, MD  pantoprazole sodium (PROTONIX) 40 mg/20 mL PACK Place 20 mLs (40 mg total) into feeding tube daily. 10/05/19   Elgergawy, Silver Huguenin, MD  polyethylene glycol (MIRALAX / GLYCOLAX) 17 g packet Place 17 g into feeding tube daily. 10/05/19   Elgergawy, Silver Huguenin, MD  QUEtiapine (SEROQUEL) 25 MG tablet Place 1 tablet (25 mg total) into feeding tube 2 (two) times daily. 10/04/19   Elgergawy, Silver Huguenin, MD  revefenacin (YUPELRI) 175 MCG/3ML nebulizer solution Take 3 mLs (175 mcg total) by nebulization daily. 10/05/19   Elgergawy, Silver Huguenin, MD  rosuvastatin (CRESTOR) 20 MG tablet Place 1 tablet (20 mg total) into feeding tube daily. 10/05/19   Elgergawy, Silver Huguenin, MD  ticagrelor (BRILINTA) 90 MG TABS tablet Place 1 tablet (90 mg total) into feeding tube 2 (two) times daily. 10/04/19   Elgergawy, Silver Huguenin, MD  umeclidinium bromide (INCRUSE ELLIPTA) 62.5 MCG/INH AEPB Inhale 1 puff into the lungs daily. 08/29/19   Reed, Tiffany L, DO  vitamin B-12 1000 MCG tablet Place 1 tablet (1,000 mcg total) into feeding tube every other day. 10/05/19   Elgergawy, Silver Huguenin, MD  Vitamin D, Ergocalciferol, (DRISDOL) 1.25 MG (50000 UNIT) CAPS capsule Place 1 capsule (50,000 Units total) into feeding tube every 7 (seven) days. 10/11/19   Elgergawy, Silver Huguenin, MD  Water For Irrigation, Sterile (FREE WATER) SOLN Place 100 mLs into feeding tube every 8 (eight) hours. 10/04/19   Elgergawy, Silver Huguenin, MD    Inpatient Medications: Scheduled Meds: . sodium chloride    Intravenous Once  . arformoterol  15 mcg Nebulization BID  . atorvastatin  40 mg Per Tube Daily  . budesonide (PULMICORT) nebulizer solution  0.25 mg Nebulization BID  . chlorhexidine gluconate (MEDLINE KIT)  15 mL Mouth Rinse BID  . Chlorhexidine Gluconate Cloth  6 each Topical Daily  . docusate  100 mg Per Tube BID  . feeding supplement (PROSource TF)  45 mL Per Tube TID  . free water  200 mL Per Tube Q4H  . insulin aspart  0-15 Units Subcutaneous Q4H  . mouth rinse  15 mL Mouth Rinse 10 times per day  . pantoprazole sodium  40 mg Per Tube Daily  . polyethylene glycol  17 g  Per Tube Daily  . sodium chloride flush  10-40 mL Intracatheter Q12H   Continuous Infusions: . sodium chloride Stopped (10/27/19 0018)  . feeding supplement (VITAL AF 1.2 CAL) 1,000 mL (10/31/19 0923)  . fentaNYL infusion INTRAVENOUS 100 mcg/hr (10/31/19 1600)  . piperacillin-tazobactam (ZOSYN)  IV 12.5 mL/hr at 10/31/19 1600   PRN Meds: sodium chloride, acetaminophen (TYLENOL) oral liquid 160 mg/5 mL, clonazePAM, docusate, fentaNYL (SUBLIMAZE) injection, fentaNYL (SUBLIMAZE) injection, ipratropium-albuterol, lidocaine HCl (PF), lidocaine-EPINEPHrine, polyethylene glycol, sodium chloride flush  Allergies:    Allergies  Allergen Reactions  . Statins Other (See Comments)  . Hydrochlorothiazide Other (See Comments)    headaches  . Hydrocodone Nausea And Vomiting  . Lisinopril Other (See Comments)    Headache   . Oxycodone Nausea Only    Social History:   Social History   Socioeconomic History  . Marital status: Divorced    Spouse name: Not on file  . Number of children: Not on file  . Years of education: Not on file  . Highest education level: Not on file  Occupational History  . Not on file  Tobacco Use  . Smoking status: Current Every Day Smoker    Types: Cigarettes  . Smokeless tobacco: Never Used  Vaping Use  . Vaping Use: Never used  Substance and Sexual Activity  . Alcohol use: Never   . Drug use: Never  . Sexual activity: Not on file  Other Topics Concern  . Not on file  Social History Narrative   Patient lives in [place].   Social Determinants of Health   Financial Resource Strain:   . Difficulty of Paying Living Expenses: Not on file  Food Insecurity:   . Worried About Charity fundraiser in the Last Year: Not on file  . Ran Out of Food in the Last Year: Not on file  Transportation Needs:   . Lack of Transportation (Medical): Not on file  . Lack of Transportation (Non-Medical): Not on file  Physical Activity:   . Days of Exercise per Week: Not on file  . Minutes of Exercise per Session: Not on file  Stress:   . Feeling of Stress : Not on file  Social Connections:   . Frequency of Communication with Friends and Family: Not on file  . Frequency of Social Gatherings with Friends and Family: Not on file  . Attends Religious Services: Not on file  . Active Member of Clubs or Organizations: Not on file  . Attends Archivist Meetings: Not on file  . Marital Status: Not on file  Intimate Partner Violence:   . Fear of Current or Ex-Partner: Not on file  . Emotionally Abused: Not on file  . Physically Abused: Not on file  . Sexually Abused: Not on file    Family History:    Family History  Problem Relation Age of Onset  . Hypertension Mother   . Hypertension Father      ROS:  Please see the history of present illness.   All other ROS reviewed and negative.     Physical Exam/Data:   Vitals:   10/31/19 1500 10/31/19 1550 10/31/19 1559 10/31/19 1600  BP: (!) 107/59 114/66  94/64  Pulse: 81   86  Resp: (!) 25   (!) 27  Temp:   98.1 F (36.7 C)   TempSrc:   Oral   SpO2: 96%   98%  Weight:      Height:  Intake/Output Summary (Last 24 hours) at 10/31/2019 1712 Last data filed at 10/31/2019 1600 Gross per 24 hour  Intake 2831.27 ml  Output 2140 ml  Net 691.27 ml   Last 3 Weights 10/31/2019 10/30/2019 10/29/2019  Weight (lbs)  184 lb 8.4 oz 178 lb 12.7 oz 173 lb 8 oz  Weight (kg) 83.7 kg 81.1 kg 78.7 kg     Body mass index is 27.25 kg/m.  General:  Sedated, mittens in place Neck: trach Cardiac:  RRR Lungs:  trach   Ext: mild B LE edema Skin: warm and dry  Neuro:  Sedated, trached  EKG:  The EKG was personally reviewed and demonstrates:  10/27/19 appears to be primarily sinus tachycardia with ectopy HR 122 Telemetry:  Telemetry was personally reviewed and demonstrates:  Sinus rhythm with about 20 min of Afib/flutter this afternoon  Relevant CV Studies:  Left heart cath 08/2018:  Previously placed Prox RCA stent (unknown type) is widely patent.  Previously placed Prox Cx to Mid Cx stent (unknown type) is widely patent.  Mid Cx to Dist Cx lesion is 60% stenosed.  Prox LAD lesion is 100% stenosed.   Patent proximal RCA stent Patent Circumflex stent. Unchanged moderate mid Circumflex stenosis Chronic occlusion proximal LAD. The distal LAD and branches fill from right to left collaterals.   Recommendations: Elevated troponin likely due to demand ischemia in setting of known LAD chronic occlusion. Her presentation is most c/w COPD exacerbation. Continue medical management of CAD.     Echo 10/26/19: 1. LVEF is severely depressed at approximately 20 to 25% with akinesis of  the inferolateral, lateral, mid/distal inferior,, mid/distal anterior and  apical walls. Cannot completely exclude mural thrombus at LV apex Consider  limited echo with Definity to  r/o. Compared to echo images from 10/17/19, LVEF was probably worse than  reported then but is now further depressed.. Left ventricular ejection  fraction, by estimation, is 20 to 25%. The left ventricle has severely  decreased function. The left ventricle  demonstrates regional wall motion abnormalities (see scoring  diagram/findings for description).  2. Right ventricular systolic function is mildly reduced. The right  ventricular size is normal.   3. The mitral valve is abnormal. Mild mitral valve regurgitation.  4. The aortic valve is tricuspid. Aortic valve regurgitation is not  visualized. Mild to moderate aortic valve sclerosis/calcification is  present, without any evidence of aortic stenosis.  5. The inferior vena cava is dilated in size with <50% respiratory  variability, suggesting right atrial pressure of 15 mmHg.    Laboratory Data:  High Sensitivity Troponin:  No results for input(s): TROPONINIHS in the last 720 hours.   Chemistry Recent Labs  Lab 10/29/19 0427 10/30/19 0448 10/31/19 0432  NA 148* 150* 151*  K 3.7 4.0 3.8  CL 106 108 106  CO2 30 33* 34*  GLUCOSE 104* 143* 128*  BUN 30* 33* 38*  CREATININE 0.84 0.94 0.92  CALCIUM 9.6 9.8 9.7  GFRNONAA >60 >60 >60  GFRAA >60 >60 >60  ANIONGAP '12 9 11    ' Recent Labs  Lab 10/26/19 0315 10/27/19 2233  PROT 7.2 7.0  ALBUMIN 2.8* 2.8*  AST 78* 34  ALT 34 61*  ALKPHOS 136* 123  BILITOT 1.5* 0.5   Hematology Recent Labs  Lab 10/29/19 0427 10/29/19 0427 10/30/19 0448 10/31/19 0432 10/31/19 1223  WBC 9.4  --  11.7* 9.2  --   RBC 2.68*  --  2.76* 2.45*  --   HGB 7.2*   < >  7.4* 6.5* 7.8*  HCT 26.1*   < > 27.7* 24.3* 28.5*  MCV 97.4  --  100.4* 99.2  --   MCH 26.9  --  26.8 26.5  --   MCHC 27.6*  --  26.7* 26.7*  --   RDW 16.8*  --  17.4* 17.4*  --   PLT 366  --  376 322  --    < > = values in this interval not displayed.   BNPNo results for input(s): BNP, PROBNP in the last 168 hours.  DDimer No results for input(s): DDIMER in the last 168 hours.   Radiology/Studies:  DG CHEST PORT 1 VIEW  Result Date: 10/28/2019 CLINICAL DATA:  Hypoxemia. EXAM: PORTABLE CHEST 1 VIEW COMPARISON:  October 27, 2019 FINDINGS: There is an unusual bend in the distal left IJ, stable in the interval. The ETT is in good position. No pneumothorax. The left lung is essentially clear. Opacity throughout the right lung has improved in the interval but some haziness does  remain. Post therapeutic changes from the patient's known history of lung cancer identified in the right perihilar region. The cardiomediastinal silhouette is stable. No other acute abnormalities. IMPRESSION: 1. There is a somewhat unusual bend in the distal left IJ which is stable over the last 2 studies but new since October 26, 2019. It is possible the left IJ tip is extending into the azygos vein. ETT in good position. 2. Post therapeutic changes in the right perihilar region. 3. Persistent but decreasing haziness throughout the right lung, particularly centrally could represent edema or airspace disease. Electronically Signed   By: Dorise Bullion III M.D   On: 10/28/2019 07:38   DG Chest Port 1 View  Result Date: 10/27/2019 CLINICAL DATA:  ETT EXAM: PORTABLE CHEST 1 VIEW COMPARISON:  Radiograph 10/27/2019 FINDINGS: Endotracheal tube terminates in the mid to lower trachea, 3.5 cm from the carina. A left IJ approach central venous catheter tip appears coiled towards the level of the brachiocephalic-caval confluence in similar positioning to comparison accounting for differences in technique. Telemetry leads and external support devices overlie the chest. There is some improved aeration in the right lung albeit with residual consolidative and heterogeneous opacity throughout the aerated parenchyma. More hazy interstitial opacities are again seen in the left lung with focal opacity in the retrocardiac space unchanged from prior. Right pleural fluid is again suspected much of which is likely layering. No discernible left effusion. No visible pneumothorax. Suspect some edema with central vascular congestion and cuffing as well as peripheral septal lines. Cardiac silhouette is partially obscured by opacity along the right heart border however the heart appears frankly enlarged. Coronary artery stents are noted. Aorta is calcified. Right paramediastinal surgical material is again seen. IMPRESSION: 1. Endotracheal  tube terminates in the mid to lower trachea, 3.5 cm from the carina. 2. Left IJ approach central venous catheter tip appears coiled towards the level of the brachiocephalic-caval confluence in similar positioning to comparison. 3. Improved aeration in the right lung albeit with residual consolidative and heterogeneous opacity throughout the aerated parenchyma. 4. Gradient opacity in the right hemithorax likely reflecting some layering effusion as well. 5. Persistent hazy interstitial opacities in the left lung with focal opacity in the retrocardiac space. Similar to comparison. 6. Suspect interstitial edema with vascular congestion, septal thickening cuffing. 7. Stable cardiomegaly. 8.  Aortic Atherosclerosis (ICD10-I70.0). Electronically Signed   By: Lovena Le M.D.   On: 10/27/2019 23:39   DG CHEST PORT 1 VIEW  Result Date: 10/27/2019 CLINICAL DATA:  Respiratory failure. EXAM: PORTABLE CHEST 1 VIEW COMPARISON:  October 26, 2019 FINDINGS: The endotracheal tube seen on the prior study has been removed. Stable left internal jugular venous catheter positioning is noted. There is complete opacification of the right hemithorax which represents a new finding when compared to the prior exam. Curvilinear suture like opacities are seen overlying the right hilar region. Mild diffuse chronic appearing increased lung markings are seen throughout the left lung with moderate to marked severity atelectasis and/or infiltrate noted within the retrocardiac region of the left lung base. No pneumothorax is seen. The heart size and mediastinal contours are within normal limits. There is marked severity calcification of the aortic arch. The visualized skeletal structures are unremarkable. IMPRESSION: 1. Interval endotracheal tube removal with complete opacification of the right hemithorax. This is consistent with worsening pleural effusion and airspace disease since the prior exam. 2. Moderate to marked severity left basilar  atelectasis and/or infiltrate. Electronically Signed   By: Virgina Norfolk M.D.   On: 10/27/2019 22:53   CT HEAD CODE STROKE WO CONTRAST`  Result Date: 10/31/2019 CLINICAL DATA:  Code stroke.  New onset right-sided weakness EXAM: CT HEAD WITHOUT CONTRAST TECHNIQUE: Contiguous axial images were obtained from the base of the skull through the vertex without intravenous contrast. COMPARISON:  08/12/2019 FINDINGS: Brain: 10 cc high-density, acute hematoma centered in the left thalamus and extending into adjacent deep white matter, 3 x 2.4 x 2.7 cm. There is a thin rim of adjacent edema. Local mass effect and midline displacement to the right by up to 3-4 mm. No evidence of infarct. No hydrocephalus. Vascular: Negative Skull: Negative Sinuses/Orbits: Negative Other: Critical Value/emergent results were called by telephone at the time of interpretation on 10/31/2019 at 8:13 am to provider [place] , who was already aware. ASPECTS [facility] Stroke Program Early CT Score) Not scored in this setting IMPRESSION: 10 cc acute hematoma centered at the left thalamus, usually hypertensive in this location. Electronically Signed   By: Monte Fantasia M.D.   On: 10/31/2019 08:14   {  Assessment and Plan:   Hemorrhagic stroke - hold DAPT and anticoagulation - per primary   Paroxysmal Atrial fibrillation/flutter with RVR - approximately 20 min of Afib this afternoon at 1300, self converted with conversion pause at 1317 - hold anticoagulation for bleed - may use low dose BB if rate control needed - would avoid amiodarone given pulmonary issues   CAD s/p DES to RCA and Cx 12/2018 - pt was maintained on ASA and brilinta for PCI approximately 10 months ago - please hold DAPT in the setting of bleed - continue statin as able - given her history of bleed, would not restart brilinta - once stable, if antiplatelet is needed, would recommend plavix, but defer start time to neurology   Chronic systolic heart  failure - EF now 25-30%, was 35-40% on 10/17/19 - recommend supportive care - not a candidate for invasive studies given bleed - fluid balance per primary - home losartan on hold - IV lasix as needed - received 40 mg IV lasix x 1 today   Other Active problems: Respiratory failure Shock PNA Right sided neglect Metabolic encephalopathy COPD      For questions or updates, please contact [facility] Please consult [email or web address] for contact info under    Signed, Ledora Bottcher, [place]  10/31/2019 5:12 PM

## 2019-10-31 NOTE — Progress Notes (Signed)
eLink Physician-Brief Progress Note Patient Name: Chelsea Allison DOB: October 04, 1955 MRN: [id number]   Date of Service  10/31/2019  HPI/Events of Note  Anemia   eICU Interventions  T/S, 1 unit RBC and post transfusion h/h ordered RN to take consent     Intervention Category Intermediate Interventions: Diagnostic test evaluation  Margaretmary Lombard 10/31/2019, 5:26 AM

## 2019-10-31 NOTE — Progress Notes (Signed)
CRITICAL VALUE ALERT  Critical Value:  Hemoglobin 6.5  Date & Time Notied:  10/31/2019 0515  Provider Notified: Warren Lacy  Orders Received/Actions taken: awaiting orders

## 2019-10-31 NOTE — Code Documentation (Signed)
Pt was last known well at 0400. At 0743, pt was noted to be hemiplegic on Rt side. Her RN Lattie Haw said pt's baseline was some confusion but able to follow commands. Pt on SQ Heparin and Brillinta. CBG 111. Wt 83.7kg.SRN to bedside   at  0750. NIHSS 22, most significant finding is severe Rt hemiplegia. To stat [facility], arrived at 0803. Accompanied by RN Lattie Haw RT and RRN. CTH showed Left hemorrhage per neurologist. BP already under goal of 140 SBP. Pt returned to room 19M 09. by RRN and RN. For further management. Discussed need for maintaining SBP < 140 and q 1hr VS and mNIHSS and pupil checks with RN Lattie Haw. No TPA or Thrombectomy as pt has hemorrhage.

## 2019-10-31 NOTE — Progress Notes (Signed)
NAME:  Chelsea Allison, MRN:  [id number], DOB:  12-21-1955, LOS: 5 ADMISSION DATE:  10/26/2019, CONSULTATION DATE:  10/26/19 REFERRING MD:  Versie Starks, CHIEF COMPLAINT:  Respiratory failure   Brief History   46 yof admitted at [facility] 6/29-7/28 for acute respiratory failure 2/2 HCAP/COPD exacerbation. She was unable to be weaned off the vent and was eventually trached on 7/6. She was [facility]. She was readmitted to [facility] on 8/19 for acute on chronic respiratory failure 2/2 hemorrhaging  from her tracheostomy.   Past Medical History  COPD, AoC respiratory failure, CAD, CHF, HTN  Significant Hospital Events   8/19 admitted to PCCM  Consults:  n/a  Procedures:  8/19 intubated, 8/20 self extubated, 8/20 reintubated due to respiratory distress. 8/19 bronchoscopy [facility] 8/19 >  Significant Diagnostic Tests:  8/19 CXR>>enlarging right pleural effusion with increasing right basilar atelectic changes and subtotal collapse of RML and RLL  8/19 chest x-ray>>Veiling opacity in the right lung favored due to a combination of pleural effusion and atelectasis. Improving ventilation in the left lung since 10/10/2019 with stable residual left upper lung reticulonodular opacity.  8/19 echocardiogram >> EF of 20 to 25% with global akinesis of left ventricle.  Cannot completely exclude mural thrombus at left ventricular apex.  Right ventricular systolic function is mildly reduced with normal size.The inferior vena cava is dilated in size with <50% respiratory  variability, suggesting right atrial pressure of 15 mmHg.   8/20 echocardiogram>> no apical thrombus, EF 25-30%  Micro Data:  Blood culture 8/19 >> no growth uptoday  Antimicrobials:  Vancomycin 8/19 - 8/21 Zosyn 8/19 >>  Interim history/subjective:  No acute events overnight RN assessed right upper and lower extremity flaccidity on AM assessment with decreased mentation and right facial droop.  Last known normal was 0400 8/24 Code  stroke called    Objective   Blood pressure (!) 98/55, pulse 69, temperature 100 F (37.8 C), temperature source Axillary, resp. rate (!) 27, height 5\' 9"  (1.753 m), weight 83.7 kg, SpO2 97 %.    Vent Mode: PCV FiO2 (%):  [40 %-60 %] 40 % Set Rate:  [24 bmp] 24 bmp PEEP:  [5 cmH20] 5 cmH20 Plateau Pressure:  [19 cmH20-27 cmH20] 24 cmH20   Intake/Output Summary (Last 24 hours) at 10/31/2019 0733 Last data filed at 10/31/2019 0600 Gross per 24 hour  Intake 2657.79 ml  Output 995 ml  Net 1662.79 ml   Filed Weights   10/29/19 0259 10/30/19 0417 10/31/19 0500  Weight: 78.7 kg 81.1 kg 83.7 kg     Physical exam: General: Chronically ill appearing elderly female lying in bed on mechanical ventilation through trach, in NAD HEENT: 6 cuffed shiley trach midline, MM pink/moist, PERRL,sclera non-icteric  Neuro: Will open eyes to verbal stimuli, follow command to grip on left upper extremity, flaccid to right upper and lower extremity with right facial droop as well  CV: s1s2 regular rate and rhythm, no murmur, rubs, or gallops,  PULM:  Bilateral rhonchi, tolerating vent through trach well, oxygen saturations 97-98 on 40%Fio2 GI: soft, bowel sounds active in all 4 quadrants, non-tender, non-distended, tolerating TF Extremities: warm/dry, no edema  Skin: no rashes or lesions  Resolved Hospital Problem list   Acute kidney injury  Assessment & Plan:  Acute on chronic hypoxic and hypercarbic respiratory failure due to aspiration pneumonia  Hemorrhage from tracheostomy site Severe COPD, not in exacerbation -[facility] in acute respiratory distress and hemorrhage from airway. She was seen with  significant hypoxia  -Redo tracheostomy completed 8/22 P: Mentation has remained a barrier to liberation from vent Continue SBT trails as tolerated  Continue ventilator support with lung protective strategies  Wean PEEP and FiO2 for sats greater than 90%. Head of bed elevated 30  degrees. Plateau pressures less than 30 cm H20.  Follow intermittent chest x-ray and ABG.   SAT/SBT as tolerated, mentation preclude extubation  Ensure adequate pulmonary hygiene  Follow cultures  VAP bundle in place  PAD protocol Continue Zosyn, stop dated 8/25  New right side weakness with facial droop -Patient was seen on AM assessment by nurse to have new right side flaccidity with right facial droop and decreased alertness.  P: Code stroke called  Stat head CT Neuro consult Statin and Brillinta resumed 8/23 Obtain lipid panel Recent ECHO obtained 8/20, see above   Acute metabolic encephalopathy due to hypercapnia -Patient continues to remain more altered compared to baseline and no appears to have acute stroke. She developed neuro deficit morning of 8/24.  P: Minimize sedation  Delirium precautions  Stroke workup as above   Chronic systolic and diastolic congestive heart failure Coronary artery disease -S/p STEMI with placement of DES 12/2018 on Brillinta, Per cardiology plan is to continue ASA/Brilinta/stain for 1 year P: Monitor I&O  Daily weight  Brillinta resumed 8/23   Hypophosphatemia, resolved  Hypernatremia  P: Continue free water  Trend Bmet   Acute on chronic anemia due to blood loss from tracheostomy site -Readmitted to PCCM on 8/19 for acute on chronic respiratory failure 2/2 hemorrhaging  from her tracheostomy.  P: Hemoglobin further down trended to 6.5 Type and screen  Transfuse 1 unit PRBC Repeat H&H later this afternoon  Hemoglobin goal greater than 7   Best practice:  Diet: NPO, tube feed Pain/Anxiety/Delirium protocol (if indicated): Fentanyl  VAP protocol (if indicated): yes DVT prophylaxis: Heparin GI prophylaxis: PPI Glucose control: Sliding scale Mobility: BR Code Status: DNR Family Communication: Will call and update son once CT head completed  Disposition: ICU  CRITICAL CARE Performed by: Johnsie Cancel  Total critical  care time: 42 minutes  Critical care time was exclusive of separately billable procedures and treating other patients.  Critical care was necessary to treat or prevent imminent or life-threatening deterioration.  Critical care was time spent personally by me on the following activities: development of treatment plan with patient and/or surrogate as well as nursing, discussions with consultants, evaluation of patient's response to treatment, examination of patient, obtaining history from patient or surrogate, ordering and performing treatments and interventions, ordering and review of laboratory studies, ordering and review of radiographic studies, pulse oximetry and re-evaluation of patient's condition.  Johnsie Cancel, NP-C [facility] Contact / Pager information can be found on Amion  10/31/2019, 7:33 AM

## 2019-10-31 NOTE — Consult Note (Addendum)
NEURO HOSPITALIST CONSULT NOTE   Requestig physician: Dr. Tacy Learn  Reason for Consult: Acute onset of right hemiplegia  History obtained from:  ICU Team and Chart     HPI:                                                                                                                                          Chelsea Allison is an 64 y.o. female with past medical history of acute on chronic respiratory failure with hypoxia, congestive heart failure, CAD, hyperlipidemia and hypertension.  Patient has had multiple admissions for hypercapnia with recent admission again for hypercapnia.  Patient was admitted to PCCM at that time.  While on PCCM service patient was trached.  [facility] and while in [facility] she had trach and PEG.  Patient was then transferred back to PCCM secondary to profound hemoptysis.  At that point she had a bronchoscopy and was intubated.  Multiple clots were found in the lungs; however, she was improving to the extent that she was taken off intubation and placed back on trach.  This AM it was noted that patient had right-sided paralysis.  CT was obtained, showing a 10 cc acute hematoma centered at the left thalamus.  Due to patient being on Brilinta, DDAVP is being administered.  Currently patient is trached and breathing over the vent.  Exam as below.  Past Medical History:  Diagnosis Date  . Acute on chronic respiratory failure with hypoxia ([place])   . Acute on chronic respiratory failure with hypoxia ([place])   . CAD (coronary artery disease) 10/20 DES to pRCA, DES mCx   . Cancer of lung ([employer])    Twice  . Chronic congestive heart failure ([place])   . Chronic HFrEF (heart failure with reduced ejection fraction) ([place])   . Colon cancer ([place])   . COPD (chronic obstructive pulmonary disease) ([place])   . COPD, severe ([place])   . COPD, severe ([place])   . Coronary artery disease involving native coronary artery of native heart without angina pectoris   .  Hyperlipidemia   . Hypertension   . Ischemic cardiomyopathy   . Neuropathy    Secondary to chemo  . STEMI (ST elevation myocardial infarction) ([place]) 12/12/2018  . Tobacco abuse     Past Surgical History:  Procedure Laterality Date  . ABDOMINAL SURGERY     For colon cancer  . COLOSTOMY    . CORONARY STENT INTERVENTION N/A 12/20/2018   Procedure: CORONARY STENT INTERVENTION - Right;  Surgeon: Burnell Blanks, MD;  Location: [place] CV LAB;  Service: Cardiovascular;  Laterality: N/A;  . CORONARY/GRAFT ACUTE MI REVASCULARIZATION N/A 12/12/2018   Procedure: Coronary/Graft Acute MI Revascularization;  Surgeon: Belva Crome, MD;  Location: [facility] INVASIVE CV  LAB;  Service: Cardiovascular;  Laterality: N/A;  . IR GASTROSTOMY TUBE MOD SED  10/20/2019  . LEFT HEART CATH AND CORONARY ANGIOGRAPHY N/A 12/12/2018   Procedure: LEFT HEART CATH AND CORONARY ANGIOGRAPHY;  Surgeon: Belva Crome, MD;  Location: [place] CV LAB;  Service: Cardiovascular;  Laterality: N/A;  . LEFT HEART CATH AND CORONARY ANGIOGRAPHY N/A 08/11/2019   Procedure: LEFT HEART CATH AND CORONARY ANGIOGRAPHY;  Surgeon: Burnell Blanks, MD;  Location: [place] CV LAB;  Service: Cardiovascular;  Laterality: N/A;    Family History  Problem Relation Age of Onset  . Hypertension Mother   . Hypertension Father       Social History:  reports that she has been smoking cigarettes. She has never used smokeless tobacco. She reports that she does not drink alcohol and does not use drugs.  Allergies  Allergen Reactions  . Statins Other (See Comments)  . Hydrochlorothiazide Other (See Comments)    headaches  . Hydrocodone Nausea And Vomiting  . Lisinopril Other (See Comments)    Headache   . Oxycodone Nausea Only    MEDICATIONS:                                                                                                                     Scheduled: . sodium chloride   Intravenous Once  . arformoterol  15  mcg Nebulization BID  . atorvastatin  40 mg Per Tube Daily  . budesonide (PULMICORT) nebulizer solution  0.25 mg Nebulization BID  . chlorhexidine gluconate (MEDLINE KIT)  15 mL Mouth Rinse BID  . Chlorhexidine Gluconate Cloth  6 each Topical Daily  . docusate  100 mg Per Tube BID  . feeding supplement (PROSource TF)  45 mL Per Tube TID  . free water  200 mL Per Tube Q4H  . furosemide  40 mg Intravenous Once  . insulin aspart  0-15 Units Subcutaneous Q4H  . mouth rinse  15 mL Mouth Rinse 10 times per day  . pantoprazole sodium  40 mg Per Tube Daily  . polyethylene glycol  17 g Per Tube Daily  . sodium chloride flush  10-40 mL Intracatheter Q12H   Continuous: . sodium chloride Stopped (10/27/19 0018)  . desmopressin (DDAVP) IV for Bleeding    . feeding supplement (VITAL AF 1.2 CAL) 1,000 mL (10/30/19 1024)  . fentaNYL infusion INTRAVENOUS 125 mcg/hr (10/31/19 0600)  . piperacillin-tazobactam (ZOSYN)  IV 12.5 mL/hr at 10/31/19 0600   ROS:  Unable to obtain due to aphasia   Blood pressure (!) 98/55, pulse 69, temperature 100 F (37.8 C), temperature source Axillary, resp. rate (!) 27, height _0  (1.753 m), weight 83.7 kg, SpO2 97 %.   General Examination:                                                                                                       Physical Exam  HEENT-  Normocephalic, no lesions, without obvious abnormality.  Normal external eye and conjunctiva.   Cardiovascular- S1-S2 audible, pulses palpable throughout   Lungs-on ventilator breathing over the ventilator Abdomen-no distention Extremities- Warm, dry and intact Musculoskeletal-no joint tenderness, deformity or swelling Skin-warm and dry, no hyperpigmentation, vitiligo, or suspicious lesions  Neurological Examination Mental Status: Opens eyes to sternal rub and noxious  stimuli, at this point follows no commands, nonverbal  Cranial Nerves: II: Right hemianopsia. PERRL III,IV, VI: ptosis not present, doll's intact bilaterally and purposely moving eyes left to right V,VII: Right-sided facial droop, facial light touch sensation grossly intact bilaterally VIII: Opens eyes to voice Motor: Right arm and leg flaccid. Left arm and leg move spontaneously and purposefully antigravity and localizes LUE to sternal rub Sensory: Intact to noxious stimuli Deep Tendon Reflexes: 2+ and symmetric throughout Plantars: Right: Upgoing   left: Downgoing Cerebellar: Could not assess Gait: Could not assess   Lab Results: Basic Metabolic Panel: Recent Labs  Lab 10/27/19 2233 10/27/19 2246 10/28/19 0002 10/28/19 0542 10/28/19 0542 10/28/19 1705 10/29/19 0340 10/29/19 0427 10/30/19 0448 10/31/19 0432  NA 143  --    < > 143  --   --  148* 148* 150* 151*  K 4.4  --    < > 3.6  --   --  3.8 3.7 4.0 3.8  CL 104  --   --  104  --   --   --  106 108 106  CO2 31  --   --  30  --   --   --  30 33* 34*  GLUCOSE 209*  --   --  142*  --   --   --  104* 143* 128*  BUN 32*  --   --  34*  --   --   --  30* 33* 38*  CREATININE 1.01*  --   --  0.93  --   --   --  0.84 0.94 0.92  CALCIUM 10.0  --    < > 9.6   < >  --   --  9.6 9.8 9.7  MG 2.6* 2.4  --  2.4  --  2.2  --  2.5*  --   --   PHOS 4.7* 2.7  --  2.2*  --  2.4*  --  2.7  --   --    < > = values in this interval not displayed.    CBC: Recent Labs  Lab 10/27/19 0359 10/28/19 0002 10/28/19 0542 10/29/19 0340 10/29/19 0427 10/30/19 0448 10/31/19 0432  WBC 10.7*  --  10.2  --  9.4  11.7* 9.2  HGB 8.7*   < > 7.8* 8.2* 7.2* 7.4* 6.5*  HCT 30.0*   < > 28.2* 24.0* 26.1* 27.7* 24.3*  MCV 92.6  --  95.6  --  97.4 100.4* 99.2  PLT 375  --  371  --  366 376 322   < > = values in this interval not displayed.    Cardiac Enzymes: No results for input(s): CKTOTAL, CKMB, CKMBINDEX, TROPONINI in the last 168 hours.  Lipid  Panel: No results for input(s): CHOL, TRIG, HDL, CHOLHDL, VLDL, LDLCALC in the last 168 hours.  Imaging: CT HEAD CODE STROKE WO CONTRAST`  Result Date: 10/31/2019 CLINICAL DATA:  Code stroke.  New onset right-sided weakness EXAM: CT HEAD WITHOUT CONTRAST TECHNIQUE: Contiguous axial images were obtained from the base of the skull through the vertex without intravenous contrast. COMPARISON:  08/12/2019 FINDINGS: Brain: 10 cc high-density, acute hematoma centered in the left thalamus and extending into adjacent deep white matter, 3 x 2.4 x 2.7 cm. There is a thin rim of adjacent edema. Local mass effect and midline displacement to the right by up to 3-4 mm. No evidence of infarct. No hydrocephalus. Vascular: Negative Skull: Negative Sinuses/Orbits: Negative Other: Critical Value/emergent results were called by telephone at the time of interpretation on 10/31/2019 at 8:13 am to provider Shonda Mandarino , who was already aware. ASPECTS [facility] Stroke Program Early CT Score) Not scored in this setting IMPRESSION: 10 cc acute hematoma centered at the left thalamus, usually hypertensive in this location. Electronically Signed   By: Monte Fantasia M.D.   On: 10/31/2019 08:14    Assessment: 64 year old female ICU patient with acute on chronic respiratory failure secondary to hemorrhagic complications of her tracheostomy, who had sudden onset of right hemiplegia this morning. CT head reveals an acute left thalamic hemorrhage.  1. Exam reveals right-sided flaccidity, upgoing toe, sensation intact, right facial droop, right hemianopsia 2. CT head: 10 cc acute hematoma centered at the left thalamus. The morphology and location are most consistent with a hypertensive etiology. Given her recent hemorrhagic complications as well as treatment with Brilinta, she may have developed a bleeding diathesis, which could be iatrogenic due to [facility] +/- an intrinsic hematologic abnormality.  Recommendations: 1. Stopping Brilinta and  pharmacological DVT prophylaxis 2. DVT prophylaxis with SCDs 3. DDAVP is being administered. [place] assistance 4. SBP goal of < 140 5. No antiplatelet medications or anticoagulants. 6. DVT prophylaxis with SCDs.  7. When stable, will need PT and OT consults 8. Speech consult 9. MRI brain when able   40 minutes spent in the emergent neurological evaluation and management of this critically ill patient.    Electronically signed: Dr. Kerney Elbe 10/31/2019, 8:38 AM

## 2019-11-01 ENCOUNTER — Inpatient Hospital Stay (HOSPITAL_COMMUNITY): Payer: Medicare HMO

## 2019-11-01 DIAGNOSIS — I5022 Chronic systolic (congestive) heart failure: Secondary | ICD-10-CM

## 2019-11-01 DIAGNOSIS — R509 Fever, unspecified: Secondary | ICD-10-CM

## 2019-11-01 DIAGNOSIS — I255 Ischemic cardiomyopathy: Secondary | ICD-10-CM

## 2019-11-01 DIAGNOSIS — R0902 Hypoxemia: Secondary | ICD-10-CM

## 2019-11-01 DIAGNOSIS — I610 Nontraumatic intracerebral hemorrhage in hemisphere, subcortical: Secondary | ICD-10-CM

## 2019-11-01 DIAGNOSIS — I611 Nontraumatic intracerebral hemorrhage in hemisphere, cortical: Secondary | ICD-10-CM

## 2019-11-01 DIAGNOSIS — D72829 Elevated white blood cell count, unspecified: Secondary | ICD-10-CM

## 2019-11-01 DIAGNOSIS — I5021 Acute systolic (congestive) heart failure: Secondary | ICD-10-CM

## 2019-11-01 LAB — GLUCOSE, CAPILLARY
Glucose-Capillary: 103 mg/dL — ABNORMAL HIGH (ref 70–99)
Glucose-Capillary: 111 mg/dL — ABNORMAL HIGH (ref 70–99)
Glucose-Capillary: 127 mg/dL — ABNORMAL HIGH (ref 70–99)
Glucose-Capillary: 130 mg/dL — ABNORMAL HIGH (ref 70–99)
Glucose-Capillary: 131 mg/dL — ABNORMAL HIGH (ref 70–99)
Glucose-Capillary: 143 mg/dL — ABNORMAL HIGH (ref 70–99)
Glucose-Capillary: 174 mg/dL — ABNORMAL HIGH (ref 70–99)

## 2019-11-01 LAB — POCT I-STAT 7, (LYTES, BLD GAS, ICA,H+H)
Acid-Base Excess: 10 mmol/L — ABNORMAL HIGH (ref 0.0–2.0)
Acid-Base Excess: 8 mmol/L — ABNORMAL HIGH (ref 0.0–2.0)
Bicarbonate: 39.3 mmol/L — ABNORMAL HIGH (ref 20.0–28.0)
Bicarbonate: 33.1 mmol/L — ABNORMAL HIGH (ref 20.0–28.0)
Calcium, Ion: 1.32 mmol/L (ref 1.15–1.40)
Calcium, Ion: 1.26 mmol/L (ref 1.15–1.40)
HCT: 31 % — ABNORMAL LOW (ref 36.0–46.0)
HCT: 25 % — ABNORMAL LOW (ref 36.0–46.0)
Hemoglobin: 10.5 g/dL — ABNORMAL LOW (ref 12.0–15.0)
Hemoglobin: 8.5 g/dL — ABNORMAL LOW (ref 12.0–15.0)
O2 Saturation: 100 %
O2 Saturation: 95 %
Patient temperature: 101.9
Patient temperature: 100.2
Potassium: 4.1 mmol/L (ref 3.5–5.1)
Potassium: 3.6 mmol/L (ref 3.5–5.1)
Sodium: 146 mmol/L — ABNORMAL HIGH (ref 135–145)
Sodium: 145 mmol/L (ref 135–145)
TCO2: 42 mmol/L — ABNORMAL HIGH (ref 22–32)
TCO2: 34 mmol/L — ABNORMAL HIGH (ref 22–32)
pCO2 arterial: 90.7 mmHg (ref 32.0–48.0)
pCO2 arterial: 47.5 mmHg (ref 32.0–48.0)
pH, Arterial: 7.254 — ABNORMAL LOW (ref 7.350–7.450)
pH, Arterial: 7.454 — ABNORMAL HIGH (ref 7.350–7.450)
pO2, Arterial: 516 mmHg — ABNORMAL HIGH (ref 83.0–108.0)
pO2, Arterial: 76 mmHg — ABNORMAL LOW (ref 83.0–108.0)

## 2019-11-01 LAB — URINALYSIS, ROUTINE W REFLEX MICROSCOPIC
Bilirubin Urine: NEGATIVE
Glucose, UA: NEGATIVE mg/dL
Hgb urine dipstick: NEGATIVE
Ketones, ur: NEGATIVE mg/dL
Nitrite: NEGATIVE
Protein, ur: 30 mg/dL — AB
Specific Gravity, Urine: 1.026 (ref 1.005–1.030)
pH: 5 (ref 5.0–8.0)

## 2019-11-01 LAB — CBC
HCT: 27.3 % — ABNORMAL LOW (ref 36.0–46.0)
Hemoglobin: 7.8 g/dL — ABNORMAL LOW (ref 12.0–15.0)
MCH: 27.2 pg (ref 26.0–34.0)
MCHC: 28.6 g/dL — ABNORMAL LOW (ref 30.0–36.0)
MCV: 95.1 fL (ref 80.0–100.0)
Platelets: 363 10*3/uL (ref 150–400)
RBC: 2.87 MIL/uL — ABNORMAL LOW (ref 3.87–5.11)
RDW: 18.8 % — ABNORMAL HIGH (ref 11.5–15.5)
WBC: 11.7 10*3/uL — ABNORMAL HIGH (ref 4.0–10.5)
nRBC: 0.9 % — ABNORMAL HIGH (ref 0.0–0.2)

## 2019-11-01 LAB — LIPID PANEL
Cholesterol: 124 mg/dL (ref 0–200)
HDL: 27 mg/dL — ABNORMAL LOW (ref >40)
LDL Cholesterol: 70 mg/dL (ref 0–99)
Total CHOL/HDL Ratio: 4.6 RATIO
Triglycerides: 133 mg/dL (ref <150)
VLDL: 27 mg/dL (ref 0–40)

## 2019-11-01 LAB — TYPE AND SCREEN
ABO/RH(D): O POS
Antibody Screen: NEGATIVE
Unit division: 0

## 2019-11-01 LAB — BASIC METABOLIC PANEL
Anion gap: 10 (ref 5–15)
BUN: 43 mg/dL — ABNORMAL HIGH (ref 8–23)
CO2: 34 mmol/L — ABNORMAL HIGH (ref 22–32)
Calcium: 9.6 mg/dL (ref 8.9–10.3)
Chloride: 104 mmol/L (ref 98–111)
Creatinine, Ser: 1.01 mg/dL — ABNORMAL HIGH (ref 0.44–1.00)
GFR calc Af Amer: >60 mL/min (ref >60)
GFR calc non Af Amer: 59 mL/min — ABNORMAL LOW (ref >60)
Glucose, Bld: 134 mg/dL — ABNORMAL HIGH (ref 70–99)
Potassium: 3.2 mmol/L — ABNORMAL LOW (ref 3.5–5.1)
Sodium: 148 mmol/L — ABNORMAL HIGH (ref 135–145)

## 2019-11-01 LAB — BPAM RBC
Blood Product Expiration Date: 202109172359
ISSUE DATE / TIME: 202108240834
Unit Type and Rh: 5100

## 2019-11-01 MED ORDER — POTASSIUM CHLORIDE 20 MEQ/15ML (10%) PO SOLN
20.0000 meq | ORAL | Status: AC
  Administered 2019-11-01 (×2): 20 meq
  Filled 2019-11-01 (×2): qty 15

## 2019-11-01 MED ORDER — POTASSIUM CHLORIDE 10 MEQ/50ML IV SOLN
10.0000 meq | INTRAVENOUS | Status: AC
  Administered 2019-11-01 (×4): 10 meq via INTRAVENOUS
  Filled 2019-11-01 (×4): qty 50

## 2019-11-01 MED ORDER — FREE WATER
200.0000 mL | Status: DC
  Administered 2019-11-01 – 2019-11-09 (×61): 200 mL

## 2019-11-01 MED ORDER — FUROSEMIDE 10 MG/ML IJ SOLN
40.0000 mg | Freq: Once | INTRAMUSCULAR | Status: AC
  Administered 2019-11-01: 40 mg via INTRAVENOUS

## 2019-11-01 MED ORDER — FUROSEMIDE 10 MG/ML IJ SOLN
INTRAMUSCULAR | Status: AC
  Filled 2019-11-01: qty 4

## 2019-11-01 MED ORDER — SENNOSIDES-DOCUSATE SODIUM 8.6-50 MG PO TABS
1.0000 | ORAL_TABLET | Freq: Two times a day (BID) | ORAL | Status: DC
  Administered 2019-11-02 – 2019-11-03 (×3): 1
  Filled 2019-11-01 (×3): qty 1

## 2019-11-01 NOTE — Progress Notes (Addendum)
SLP Cancellation Note  Patient Details Name: Chelsea Allison MRN: [id number] DOB: 05-Jun-1955   Cancelled treatment:        Received orders for PMV, BSE and SLE. Pt currently on vent and RN reports there are no plans as of now to attempt trach collar due to upcoming MRI today. States she doesn't follow commands this morning which would be problematic if doing inline vent PMV, not for assessment on trach collar. Will continue to follow.   Houston Siren 11/01/2019, 9:32 AM   Orbie Pyo Colvin Caroli.Ed Risk analyst [phone] Office [phone]

## 2019-11-01 NOTE — Progress Notes (Signed)
Patient started to desat into the 60s. Patient appears to have fixed left gaze and is not as responsive. Patient was weaning all day so I flipped her back to full support. MD and RT notified. MD at bedside now. Awaiting orders.

## 2019-11-01 NOTE — Progress Notes (Signed)
PT Cancellation Note  Patient Details Name: Chelsea Allison MRN: [id number] DOB: 04/15/1955   Cancelled Treatment:    Reason Eval/Treat Not Completed: Medical issues which prohibited therapy.  Pt is on bedrest, we will see as able if bedrest is lifted. 11/01/2019  [name], PT Acute Rehabilitation Services [phone]  (pager) [phone]  (office)   [name] 11/01/2019, 10:21 AM

## 2019-11-01 NOTE — Progress Notes (Signed)
Carotid duplex has been completed.   Preliminary results in CV Proc.   Chelsea Allison 11/01/2019 1:48 PM

## 2019-11-01 NOTE — Progress Notes (Signed)
STROKE TEAM PROGRESS NOTE   INTERVAL HISTORY RN at bedside.  Patient lying in bed, has trach connected to ventilation.  Left gaze preference, however able to track to the right with right gaze.  Still has right hemiplegia and right facial droop.  Follow limited simple commands.  MRI showed Left BG/CR hemorrhage, no infarct.  MRA negative.  Carotid Doppler unremarkable.  Vitals:   11/01/19 1000 11/01/19 1100 11/01/19 1118 11/01/19 1200  BP: 104/69  123/69 127/66  Pulse: 77  81 99  Resp: (!) 29 (!) 24 (!) 29 (!) 30  Temp:    99.5 F (37.5 C)  TempSrc:    Oral  SpO2: 99%  99% 98%  Weight:      Height:       CBC:  Recent Labs  Lab 10/31/19 0432 10/31/19 0432 10/31/19 1223 11/01/19 0038  WBC 9.2  --   --  11.7*  HGB 6.5*   < > 7.8* 7.8*  HCT 24.3*   < > 28.5* 27.3*  MCV 99.2  --   --  95.1  PLT 322  --   --  363   < > = values in this interval not displayed.   Basic Metabolic Panel:  Recent Labs  Lab 10/28/19 1705 10/29/19 0340 10/29/19 0427 10/30/19 0448 10/31/19 0432 11/01/19 0038  NA  --    < > 148*   < > 151* 148*  K  --    < > 3.7   < > 3.8 3.2*  CL  --   --  106   < > 106 104  CO2  --   --  30   < > 34* 34*  GLUCOSE  --   --  104*   < > 128* 134*  BUN  --   --  30*   < > 38* 43*  CREATININE  --   --  0.84   < > 0.92 1.01*  CALCIUM  --   --  9.6   < > 9.7 9.6  MG 2.2  --  2.5*  --   --   --   PHOS 2.4*  --  2.7  --   --   --    < > = values in this interval not displayed.   Lipid Panel:  Recent Labs  Lab 11/01/19 0038  CHOL 124  TRIG 133  HDL 27*  CHOLHDL 4.6  VLDL 27  LDLCALC 70   HgbA1c:  Recent Labs  Lab 10/26/19 1424  HGBA1C 4.8   Urine Drug Screen: No results for input(s): LABOPIA, COCAINSCRNUR, LABBENZ, AMPHETMU, THCU, LABBARB in the last 168 hours.  Alcohol Level No results for input(s): ETH in the last 168 hours.  IMAGING past 24 hours MR ANGIO HEAD WO CONTRAST  Result Date: 11/01/2019 CLINICAL DATA:  Stroke follow-up. EXAM: MRI HEAD  WITHOUT CONTRAST MRA HEAD WITHOUT CONTRAST TECHNIQUE: Multiplanar, multiecho pulse sequences of the brain and surrounding structures were obtained without intravenous contrast. Angiographic images of the head were obtained using MRA technique without contrast. COMPARISON:  CT head code stroke 10/31/2019 FINDINGS: MRI HEAD FINDINGS Brain: No acute infarct. Similar size of an acute intraparenchymal hemorrhage centered within the left thalamus extending into surrounding deep white matter. Similar surrounding edema, which extends into the left midbrain. Similar rightward midline shift, measuring approximately 4 mm at the foramina [place]. Partial effacement left lateral ventricle similar to prior. Overall, similar ventricular size without evidence of hydrocephalus. Skull and upper cervical spine:  Diffuse T1 hypointensity and heterogeneity of the marrow. Sinuses/Orbits: Negative. Other: None. MRA HEAD FINDINGS No evidence of significant (greater than 50%) proximal arterial stenosis or occlusion. No evidence of aneurysm. Mild outpouching at the origin of the left ACA is favored to represnt an infundibulum. No evidence of a vascular malformation in the region of the acute hemorrhage, although acute blood products limit evaluation. IMPRESSION: 1. Similar size/appearance of the acute hemorrhage centered in the left thalamus, most likely hypertensive given location. Similar surrounding edema and rightward midline shift. 2. No acute vascular abnormality or underlying vascular lesion identified, although acute blood products limit evaluation. Follow up MRI after resolution of hemorrhage could further evaluate if clinically indicated. 3. Diffuse T1 hypointensity and heterogeneity of the marrow without discrete marrow replacing lesion. Findings are nonspecific but can be seen in the setting of chronic anemia, smoking, and/or lymphoproliferative disorders. Electronically Signed   By: Margaretha Sheffield MD   On: 11/01/2019 11:47    MR BRAIN WO CONTRAST  Result Date: 11/01/2019 CLINICAL DATA:  Stroke follow-up. EXAM: MRI HEAD WITHOUT CONTRAST MRA HEAD WITHOUT CONTRAST TECHNIQUE: Multiplanar, multiecho pulse sequences of the brain and surrounding structures were obtained without intravenous contrast. Angiographic images of the head were obtained using MRA technique without contrast. COMPARISON:  CT head code stroke 10/31/2019 FINDINGS: MRI HEAD FINDINGS Brain: No acute infarct. Similar size of an acute intraparenchymal hemorrhage centered within the left thalamus extending into surrounding deep white matter. Similar surrounding edema, which extends into the left midbrain. Similar rightward midline shift, measuring approximately 4 mm at the foramina [place]. Partial effacement left lateral ventricle similar to prior. Overall, similar ventricular size without evidence of hydrocephalus. Skull and upper cervical spine: Diffuse T1 hypointensity and heterogeneity of the marrow. Sinuses/Orbits: Negative. Other: None. MRA HEAD FINDINGS No evidence of significant (greater than 50%) proximal arterial stenosis or occlusion. No evidence of aneurysm. Mild outpouching at the origin of the left ACA is favored to represnt an infundibulum. No evidence of a vascular malformation in the region of the acute hemorrhage, although acute blood products limit evaluation. IMPRESSION: 1. Similar size/appearance of the acute hemorrhage centered in the left thalamus, most likely hypertensive given location. Similar surrounding edema and rightward midline shift. 2. No acute vascular abnormality or underlying vascular lesion identified, although acute blood products limit evaluation. Follow up MRI after resolution of hemorrhage could further evaluate if clinically indicated. 3. Diffuse T1 hypointensity and heterogeneity of the marrow without discrete marrow replacing lesion. Findings are nonspecific but can be seen in the setting of chronic anemia, smoking, and/or  lymphoproliferative disorders. Electronically Signed   By: Margaretha Sheffield MD   On: 11/01/2019 11:47   VAS US CAROTID  Result Date: 11/01/2019 Carotid Arterial Duplex Study Indications:       CVA. Risk Factors:      Hypertension, hyperlipidemia, coronary artery disease. Limitations        Today's exam was limited due to a central line, patient on a                    ventilator and the patient's inability or unwillingness to                    cooperate. Comparison Study:  no prior Performing Technologist: Abram Sander RVS  Examination Guidelines: A complete evaluation includes B-mode imaging, spectral Doppler, color Doppler, and power Doppler as needed of all accessible portions of each vessel. Bilateral testing is considered an integral part of  a complete examination. Limited examinations for reoccurring indications may be performed as noted.  Right Carotid Findings: +----------+--------+--------+--------+------------------+--------+           PSV cm/sEDV cm/sStenosisPlaque DescriptionComments +----------+--------+--------+--------+------------------+--------+ CCA Prox  83      12              heterogenous               +----------+--------+--------+--------+------------------+--------+ CCA Distal67      13              heterogenous               +----------+--------+--------+--------+------------------+--------+ ICA Prox  100     32      1-39%   heterogenous               +----------+--------+--------+--------+------------------+--------+ ICA Distal98      29                                         +----------+--------+--------+--------+------------------+--------+ ECA       124                                                +----------+--------+--------+--------+------------------+--------+ +----------+--------+-------+--------+-------------------+           PSV cm/sEDV cmsDescribeArm Pressure (mmHG) +----------+--------+-------+--------+-------------------+  Subclavian114                                        +----------+--------+-------+--------+-------------------+ +---------+--------+--+--------+--+---------+ VertebralPSV cm/s46EDV cm/s14Antegrade +---------+--------+--+--------+--+---------+    Summary: Right Carotid: Velocities in the right ICA are consistent with a 1-39% stenosis. Left Carotid: Unable to obtain left side due to bandages & lines. Vertebrals: Right vertebral artery demonstrates antegrade flow. Left vertebral             artery was not visualized. *See table(s) above for measurements and observations.     Preliminary     PHYSICAL EXAM  Temp:  [98.9 F (37.2 C)-101.8 F (38.8 C)] 99.2 F (37.3 C) (08/25 1558) Pulse Rate:  [47-108] 108 (08/25 1900) Resp:  [12-33] 23 (08/25 1900) BP: (94-164)/(41-86) 162/71 (08/25 1900) SpO2:  [92 %-100 %] 100 % (08/25 1900) FiO2 (%):  [40 %-100 %] 100 % (08/25 1840) Weight:  [85.7 kg] 85.7 kg (08/25 0350)  General - Well nourished, well developed, in no apparent distress, on trach with vent.  Ophthalmologic - fundi not visualized due to noncooperation.  Cardiovascular - Regular rhythm and rate.  Neuro - awake, eyes open, nonverbal, eyes left gaze preference, however able to track to have right gaze, incomplete.  Blinking to visual threat bilaterally, however inconsistent.  PERRL.  Able to follow limited peripheral commands, however did not follow midline commands.  Left upper extremity at least 4/5, spontaneous movement.  Left lower extremity 3 -/5.  Right upper and lower extremity hemiplegia. Sensation, coordination and gait not tested.  ASSESSMENT/PLAN Ms. Chelsea Allison is a 64 y.o. female with history of acute on chronic respiratory failure with hypoxia since 08/2019 with recurrent admission and discharge [facility] and readmission, eventually had trach 09/12/2019.  However readmitted due to trach site bleeding with hemoptysis.  Status post bronchoscopy and intubation.  Now  extubated back on trach.  Brilinta restarted.  However in hospital developed R sided paralysis 10/31/2019. CT shows L thalamic ICH. Administered DDAVP as she was on Brilinta.   Stroke:   L thalamic ICH secondary to small vessel disease source  CT head left thalamic ICH  Status post DDAVP  MRI left thalamic centered ICH, no infarct  MRA unremarkable  Carotid Doppler unremarkable  2D Echo EF 25 to 30%, no LV thrombus  LE venous Doppler no DVT  LDL 70  HgbA1c 4.8  VTE prophylaxis -SCDs  Brilinta (ticagrelor) 90 mg bid prior to admission, now on No antithrombotic  due to [place].  Agree with cardiology, once ICH resolves or near resolving, restart Plavix for cardiac and stroke prevention.  Therapy recommendations:  Pending  Disposition:  Pending   Recurrent respiratory failure, acute on chronic d/t aspiration PNA Hemorrhage from trach site Severe COPD  Redo trach 8/22  On vent  CCM admission   Fever and leukocytosis  T-max one 1.8  WBC 11.7  UA WBC 11-20  Blood culture 8/19 no growth  On Zosyn  CHF CAD  Coronary artery disease, hx MI s/p DES to RCA & Cx 58/8502  Chronic systolic Congestive heart failure w/ low EF 25-30%  Was on aspirin and Brilinta  Brilinta currently discontinued due to [place]  Agree with cardiology, once ICH resolved or near resolving, restarted Plavix   Hypertension  Stable on the low side . SBP goal < 140 . Long-term BP goal normotensive  Hyperlipidemia  Home meds: Crestor 20  LDL 70, goal < 70  Now on Lipitor 40  Continue statin at discharge  Dysphagia . NPO . On tube feeding . On free water  Other Stroke Risk Factors  Cigarette smoker  Overweight, Body mass index is 27.9 kg/m., recommend weight loss, diet and exercise as appropriate   Other Active Problems  Hx lung cancer  Hx colon cancer  Acute metabolic encephalopathy due to hypercapnia  Hypernatremia - on free water  Acute on chronic anemia due to  blood loss from tracheostomy site, transfused 8/24  Hospital day # 6  This patient is critically ill due to left brain ICH, respiratory failure requiring ventilation, cardiomyopathy, CHF, dysphagia, fever and leukocytosis and at significant risk of neurological worsening, death form hematoma expansion, cerebral edema, heart failure, aspiration, sepsis, septic shock, seizure. This patient's care requires constant monitoring of vital signs, hemodynamics, respiratory and cardiac monitoring, review of multiple databases, neurological assessment, discussion with family, other specialists and medical decision making of high complexity. I spent 40 minutes of neurocritical care time in the care of this patient.  Rosalin Hawking, MD PhD Stroke Neurology 11/01/2019 8:07 PM   To contact Stroke Continuity provider, please refer to [email or web address] After hours, contact General Neurology

## 2019-11-01 NOTE — Progress Notes (Signed)
NAME:  Chelsea Allison, MRN:  [id number], DOB:  08-14-1955, LOS: 6 ADMISSION DATE:  10/26/2019, CONSULTATION DATE:  10/26/19 REFERRING MD:  Versie Starks, CHIEF COMPLAINT:  Respiratory failure   Brief History   69 yof admitted at [facility] 6/29-7/28 for acute respiratory failure 2/2 HCAP/COPD exacerbation. She was unable to be weaned off the vent and was eventually trached on 7/6. She was discharged to [facility]. She was readmitted to [facility] on 8/19 for acute on chronic respiratory failure 2/2 hemorrhaging  from her tracheostomy.   Past Medical History  COPD, AoC respiratory failure, CAD, CHF, HTN  Significant Hospital Events   8/19 admitted to PCCM  Consults:  n/a  Procedures:  8/19 intubated, 8/20 self extubated, 8/20 reintubated due to respiratory distress. 8/19 bronchoscopy [facility] 8/19 >  Significant Diagnostic Tests:  8/19 CXR>>enlarging right pleural effusion with increasing right basilar atelectic changes and subtotal collapse of RML and RLL  8/19 chest x-ray>>Veiling opacity in the right lung favored due to a combination of pleural effusion and atelectasis. Improving ventilation in the left lung since 10/10/2019 with stable residual left upper lung reticulonodular opacity.  8/19 echocardiogram >> EF of 20 to 25% with global akinesis of left ventricle.  Cannot completely exclude mural thrombus at left ventricular apex.  Right ventricular systolic function is mildly reduced with normal size.The inferior vena cava is dilated in size with <50% respiratory  variability, suggesting right atrial pressure of 15 mmHg.   8/20 echocardiogram>> no apical thrombus, EF 25-30%  CT head 8/25 >  10 cc acute hematoma centered at the left thalamus, usually hypertensive in this location.  Micro Data:  Blood culture 8/19 > negative  Antimicrobials:  Vancomycin 8/19 - 8/21 Zosyn 8/19 >>  Interim history/subjective:  Right sided weakness on exam 8/24. CT head showed L thalamic hematoma.   Objective    Blood pressure (!) 106/51, pulse 80, temperature (!) 101.8 F (38.8 C), temperature source Oral, resp. rate (!) 28, height 5\' 9"  (1.753 m), weight 85.7 kg, SpO2 95 %.    Vent Mode: CPAP;PSV FiO2 (%):  [40 %] 40 % Set Rate:  [24 bmp] 24 bmp PEEP:  [5 cmH20] 5 cmH20 Pressure Support:  [12 cmH20] 12 cmH20 Plateau Pressure:  [12 cmH20-20 cmH20] 12 cmH20   Intake/Output Summary (Last 24 hours) at 11/01/2019 1039 Last data filed at 11/01/2019 0900 Gross per 24 hour  Intake 2157.76 ml  Output 2555 ml  Net -397.24 ml   Filed Weights   10/30/19 0417 10/31/19 0500 11/01/19 0350  Weight: 81.1 kg 83.7 kg 85.7 kg     Physical exam:  General: chronically ill female on vent via trach HEENT: 6 cuffed shiley trach midline, MM pink/moist, PERRL,sclera non-icteric  Neuro: alert, eyes open, Follows commands on the L. R facial droop. CV: RRR, no MRG PULM:  Bilateral expiratory wheeze. Tachypneic  GI: Soft, non-tender, non-distended.  Extremities: no acute deformity, no edema.  Skin: Grossly intact  Resolved Hospital Problem list   Acute kidney injury  Assessment & Plan:  Acute on chronic hypoxic and hypercarbic respiratory failure due to aspiration pneumonia  Hemorrhage from tracheostomy site Severe COPD, not in exacerbation -[facility]mitted [facility] in acute respiratory distress and hemorrhage from airway. She was seen with significant hypoxia  -Redo tracheostomy completed 8/22 P: Continue ventilator support with lung protective strategies  Wean PEEP and FiO2 for sats greater than 90%. Head of bed elevated 30 degrees.  SAT/SBT as tolerated Follow cultures  VAP bundle in place  PAD protocol DC zosyn after today's doses.   Left thalamic ICH -Patient was seen on AM assessment by nurse to have new right side flaccidity with right facial droop and decreased alertness.  P: MRI pending [place] per neuro Keep SBP < 154mmHg Holding DAPT until OK with neurology. Per cards when given the OK  will go with Plavix 75mg  monotherapy.   Acute metabolic encephalopathy due to hypercapnia P: Minimize sedation  Delirium precautions  ICH workup as above   Chronic systolic and diastolic congestive heart failure Coronary artery disease -S/p STEMI with placement of DES 12/2018 on Brillinta, Per cardiology plan is to continue ASA/Brilinta/stain for 1 year P: Monitor I&O  Daily weight    Hypophosphatemia, resolved  Hypernatremia  P: Increase free water  Trend Bmet   Acute on chronic anemia due to blood loss from tracheostomy site -Readmitted to PCCM on 8/19 for acute on chronic respiratory failure 2/2 hemorrhaging  from her tracheostomy.  P: S/p 1 unit PRBC on 8/24  Hemoglobin goal greater than 7   Best practice:  Diet: NPO, tube feed Pain/Anxiety/Delirium protocol (if indicated): Fentanyl  VAP protocol (if indicated): yes DVT prophylaxis: Heparin GI prophylaxis: PPI Glucose control: Sliding scale Mobility: BR Code Status: DNR Family Communication: Son updated.  Disposition: ICU   Georgann Housekeeper, AGACNP-BC [address]  See Amion for personal pager PCCM on call pager [phone]  11/01/2019 11:22 AM

## 2019-11-01 NOTE — Progress Notes (Signed)
Progress Note  Patient Name: Chelsea Allison Date of Encounter: 11/01/2019  [facility] Cardiologist: Sinclair Grooms, MD   Subjective   Patient has tracheostomy and is on vent. She is sleeping at this time. She is also sedated.  Inpatient Medications    Scheduled Meds: . sodium chloride   Intravenous Once  . arformoterol  15 mcg Nebulization BID  . atorvastatin  40 mg Per Tube Daily  . budesonide (PULMICORT) nebulizer solution  0.25 mg Nebulization BID  . chlorhexidine gluconate (MEDLINE KIT)  15 mL Mouth Rinse BID  . Chlorhexidine Gluconate Cloth  6 each Topical Daily  . docusate  100 mg Per Tube BID  . feeding supplement (PROSource TF)  45 mL Per Tube TID  . free water  200 mL Per Tube Q4H  . insulin aspart  0-15 Units Subcutaneous Q4H  . mouth rinse  15 mL Mouth Rinse 10 times per day  . pantoprazole sodium  40 mg Per Tube Daily  . polyethylene glycol  17 g Per Tube Daily  . potassium chloride  20 mEq Per Tube Q4H  . senna-docusate  1 tablet Oral BID  . sodium chloride flush  10-40 mL Intracatheter Q12H   Continuous Infusions: . sodium chloride Stopped (10/27/19 0018)  . clevidipine    . feeding supplement (VITAL AF 1.2 CAL) 1,000 mL (11/01/19 0512)  . fentaNYL infusion INTRAVENOUS 100 mcg/hr (11/01/19 0800)  . piperacillin-tazobactam (ZOSYN)  IV 12.5 mL/hr at 11/01/19 0800  . potassium chloride 50 mL/hr at 11/01/19 0800   PRN Meds: sodium chloride, acetaminophen **OR** acetaminophen (TYLENOL) oral liquid 160 mg/5 mL **OR** acetaminophen, clonazePAM, docusate, fentaNYL (SUBLIMAZE) injection, fentaNYL (SUBLIMAZE) injection, ipratropium-albuterol, lidocaine HCl (PF), lidocaine-EPINEPHrine, polyethylene glycol, sodium chloride flush   Vital Signs    Vitals:   11/01/19 0600 11/01/19 0700 11/01/19 0744 11/01/19 0800  BP: (!) 111/58 (!) 116/52 (!) 116/52 128/65  Pulse: 75 91 88 88  Resp: (!) 25 (!) 29 (!) 32 (!) 32  Temp:    (!) 101.8 F (38.8 C)  TempSrc:     Oral  SpO2: 96% 96% 98% 96%  Weight:      Height:        Intake/Output Summary (Last 24 hours) at 11/01/2019 0851 Last data filed at 11/01/2019 0826 Gross per 24 hour  Intake 2668 ml  Output 2555 ml  Net 113 ml   Last 3 Weights 11/01/2019 10/31/2019 10/30/2019  Weight (lbs) 188 lb 15 oz 184 lb 8.4 oz 178 lb 12.7 oz  Weight (kg) 85.7 kg 83.7 kg 81.1 kg      Telemetry    Maintaining sinus rhythm.- Personally Reviewed  ECG    No new or recent EKG.- Personally Reviewed  Physical Exam  Appears older than stated age. Has tracheostomy and is on ventilator. GEN:  Chronically ill in appearance Neck: No JVD, but difficult to assess Cardiac:  Heart tones are distant and difficult to appreciate due to background respiratory sounds and ventilator. Respiratory: Clear to anterior auscultation bilaterally. GI: Soft, nontender, non-distended  MS: No edema; No deformity. Neuro:  Nonfocal  Psych: Normal affect   Labs    High Sensitivity Troponin:  No results for input(s): TROPONINIHS in the last 720 hours.    Chemistry Recent Labs  Lab 10/26/19 0315 10/26/19 0520 10/27/19 2233 10/28/19 0002 10/30/19 0448 10/31/19 0432 11/01/19 0038  NA 136   < > 143   < > 150* 151* 148*  K 4.1   < >  4.4   < > 4.0 3.8 3.2*  CL 92*   < > 104   < > 108 106 104  CO2 30   < > 31   < > 33* 34* 34*  GLUCOSE 253*   < > 209*   < > 143* 128* 134*  BUN 35*   < > 32*   < > 33* 38* 43*  CREATININE 1.16*   < > 1.01*   < > 0.94 0.92 1.01*  CALCIUM 9.8   < > 10.0   < > 9.8 9.7 9.6  PROT 7.2  --  7.0  --   --   --   --   ALBUMIN 2.8*  --  2.8*  --   --   --   --   AST 78*  --  34  --   --   --   --   ALT 34  --  61*  --   --   --   --   ALKPHOS 136*  --  123  --   --   --   --   BILITOT 1.5*  --  0.5  --   --   --   --   GFRNONAA 50*   < > 59*   < > >60 >60 59*  GFRAA 58*   < > >60   < > >60 >60 >60  ANIONGAP 14   < > 8   < > '9 11 10   ' < > = values in this interval not displayed.     Hematology Recent  Labs  Lab 10/30/19 0448 10/30/19 0448 10/31/19 0432 10/31/19 1223 11/01/19 0038  WBC 11.7*  --  9.2  --  11.7*  RBC 2.76*  --  2.45*  --  2.87*  HGB 7.4*   < > 6.5* 7.8* 7.8*  HCT 27.7*   < > 24.3* 28.5* 27.3*  MCV 100.4*  --  99.2  --  95.1  MCH 26.8  --  26.5  --  27.2  MCHC 26.7*  --  26.7*  --  28.6*  RDW 17.4*  --  17.4*  --  18.8*  PLT 376  --  322  --  363   < > = values in this interval not displayed.    BNPNo results for input(s): BNP, PROBNP in the last 168 hours.   DDimer No results for input(s): DDIMER in the last 168 hours.   Radiology    CT HEAD CODE STROKE WO CONTRAST`  Result Date: 10/31/2019 CLINICAL DATA:  Code stroke.  New onset right-sided weakness EXAM: CT HEAD WITHOUT CONTRAST TECHNIQUE: Contiguous axial images were obtained from the base of the skull through the vertex without intravenous contrast. COMPARISON:  08/12/2019 FINDINGS: Brain: 10 cc high-density, acute hematoma centered in the left thalamus and extending into adjacent deep white matter, 3 x 2.4 x 2.7 cm. There is a thin rim of adjacent edema. Local mass effect and midline displacement to the right by up to 3-4 mm. No evidence of infarct. No hydrocephalus. Vascular: Negative Skull: Negative Sinuses/Orbits: Negative Other: Critical Value/emergent results were called by telephone at the time of interpretation on 10/31/2019 at 8:13 am to provider Lindzen , who was already aware. ASPECTS [facility] Stroke Program Early CT Score) Not scored in this setting IMPRESSION: 10 cc acute hematoma centered at the left thalamus, usually hypertensive in this location. Electronically Signed   By: Monte Fantasia M.D.   On: 10/31/2019  08:14    Cardiac Studies   2D Doppler echocardiogram 10/27/2019: IMPRESSIONS  1. Focused exam to exclude LV thrombus. Despite use of Definity contrast,  wall motion assessment remains suboptimal.  2. No left ventricular apical thrombus.  3. Left ventricular ejection fraction, by  estimation, is 25 to 30%. The  left ventricle has severely decreased function. The left ventricle  demonstrates regional wall motion abnormalities (see scoring  diagram/findings for description).   Patient Profile     64 y.o. female with CAD s/p STEMI 12/2018, mid circumflex and proximal RCA DES October 2020, absence of cardiology follow-up, COPD, current smoker, hx of lung cancer, HTN, HLD, ischemic cardiomyopathy, chronic systolic heart failure with EF 35-40%, and hx of colon cancer s/p colostomy who is being seen today for the evaluation of intracranial bleed on DAPT at the request of Dr. Tacy Learn.  Assessment & Plan    1. CAD, with three-vessel disease, chronic total occlusion of LAD, circumflex and RCA stent implantation October 2020. No current signal of unstable angina or ischemia. Dual antiplatelet therapy has been discontinued because of intracerebral hemorrhage. When neurology feels antiplatelet therapy is safe, would recommend clopidogrel monotherapy 75 mg/day. Would not resume dual antiplatelet therapy. 2. Severe/end-stage COPD with carbon dioxide retention: Patient has tracheostomy and is on vent. 3. Chronic systolic heart failure: Continue to be vigilant relative to volume control/status given tenuous respiratory situation. [facility] will sign off.   Medication Recommendations: Call if questions or concerns about antiplatelet therapy. Would not resume antiplatelet therapy until cleared by neurology/neurosurgery. Other recommendations (labs, testing, etc): None Follow up as an outpatient: If appropriate, should have cardiology follow-up as an outpatient, however she has been in the hospital more than independent at home. She has never establish cardiology follow-up after her MI in 2020 (October).  For questions or updates, please contact [place] Please consult [email or web address] for contact info under        Signed, Sinclair Grooms, MD  11/01/2019, 8:51 AM

## 2019-11-01 NOTE — Progress Notes (Signed)
OT Cancellation Note  Patient Details Name: Chelsea Allison MRN: [id number] DOB: 1955-11-24   Cancelled Treatment:    Reason Eval/Treat Not Completed: Patient not medically ready;Active bedrest order OT order received and appreciated however this conflicts with current bedrest order set. Please increase activity tolerance as appropriate and remove bedrest from orders. . Please contact OT at [phone] if bed rest order is discontinued. OT will hold evaluation at this time and will check back as time allows pending increased activity orders.   Billey Chang, OTR/L  Acute Rehabilitation Services Pager: [phone] Office: [phone] .  11/01/2019, 8:41 AM

## 2019-11-01 NOTE — TOC Progression Note (Signed)
Transition of Care [facility]) - Progression Note    Patient Details  Name: Chelsea Allison MRN: [id number] Date of Birth: January 24, 1956  Transition of Care [facility]) CM/SW Contact  Carles Collet, RN Phone Number: 11/01/2019, 2:56 PM  Clinical Narrative:    Patient admitted from [facility]. Spoke w son Gerald Stabs, who is agreeable to patient returning there at DC. Spoke w [employer] of [facility] who states patient has bed there on hold (is on 9 day LOA).  Spoke w MD who states that we will plan for DC to [facility] Friday.   Please reach out to [place] at [place] on Friday to facilitate return.     Expected Discharge Plan: Long Term Acute Care (LTAC) Barriers to Discharge: Continued Medical Work up  Expected Discharge Plan and Services Expected Discharge Plan: [place] (LTAC)                                               Social Determinants of Health (SDOH) Interventions    Readmission Risk Interventions Readmission Risk Prevention Plan 01/11/2019 12/20/2018 12/20/2018  Transportation Screening Complete - Complete  PCP or Specialist Appt within 5-7 Days - Complete -  PCP or Specialist Appt within 3-5 Days (No Data) - -  Home Care Screening - - Complete  Medication Review (RN CM) - - Complete  HRI or Home Care Consult Complete - -  Social Work Consult for [place] Planning/Counseling Complete - -  Palliative Care Screening Not Applicable - -  Medication Review (RN Care Manager) Complete - -  Some recent data might be hidden

## 2019-11-01 NOTE — Progress Notes (Addendum)
Critical ABG given to [facility].  New changes made. Will recheck ABG

## 2019-11-01 NOTE — Progress Notes (Signed)
[facility] ADULT ICU REPLACEMENT PROTOCOL   The patient does apply for the [facility] Adult ICU Electrolyte Replacment Protocol based on the criteria listed below:   1. Is GFR >/= 30 ml/min? Yes.    Patient's GFR today is 59 2. Is SCr </= 2? Yes.   Patient's SCr is 1.01 ml/kg/hr 3. Did SCr increase >/= 0.5 in 24 hours? No. 4. Abnormal electrolyte(s): k 3.2 5. Ordered repletion with: protocol 6. If a panic level lab has been reported, has the CCM MD in charge been notified? No..   Physician:    Ronda Fairly A 11/01/2019 5:49 AM

## 2019-11-02 ENCOUNTER — Inpatient Hospital Stay (HOSPITAL_COMMUNITY): Payer: Medicare HMO

## 2019-11-02 DIAGNOSIS — I610 Nontraumatic intracerebral hemorrhage in hemisphere, subcortical: Secondary | ICD-10-CM

## 2019-11-02 LAB — CBC
HCT: 26.2 % — ABNORMAL LOW (ref 36.0–46.0)
Hemoglobin: 7.3 g/dL — ABNORMAL LOW (ref 12.0–15.0)
MCH: 27.2 pg (ref 26.0–34.0)
MCHC: 27.9 g/dL — ABNORMAL LOW (ref 30.0–36.0)
MCV: 97.8 fL (ref 80.0–100.0)
Platelets: 346 10*3/uL (ref 150–400)
RBC: 2.68 MIL/uL — ABNORMAL LOW (ref 3.87–5.11)
RDW: 18.5 % — ABNORMAL HIGH (ref 11.5–15.5)
WBC: 11.3 10*3/uL — ABNORMAL HIGH (ref 4.0–10.5)
nRBC: 0.4 % — ABNORMAL HIGH (ref 0.0–0.2)

## 2019-11-02 LAB — BASIC METABOLIC PANEL
Anion gap: 10 (ref 5–15)
BUN: 44 mg/dL — ABNORMAL HIGH (ref 8–23)
CO2: 30 mmol/L (ref 22–32)
Calcium: 9.1 mg/dL (ref 8.9–10.3)
Chloride: 102 mmol/L (ref 98–111)
Creatinine, Ser: 0.87 mg/dL (ref 0.44–1.00)
GFR calc Af Amer: >60 mL/min (ref >60)
GFR calc non Af Amer: >60 mL/min (ref >60)
Glucose, Bld: 101 mg/dL — ABNORMAL HIGH (ref 70–99)
Potassium: 3.5 mmol/L (ref 3.5–5.1)
Sodium: 142 mmol/L (ref 135–145)

## 2019-11-02 LAB — GLUCOSE, CAPILLARY
Glucose-Capillary: 92 mg/dL (ref 70–99)
Glucose-Capillary: 119 mg/dL — ABNORMAL HIGH (ref 70–99)
Glucose-Capillary: 135 mg/dL — ABNORMAL HIGH (ref 70–99)
Glucose-Capillary: 151 mg/dL — ABNORMAL HIGH (ref 70–99)
Glucose-Capillary: 104 mg/dL — ABNORMAL HIGH (ref 70–99)
Glucose-Capillary: 107 mg/dL — ABNORMAL HIGH (ref 70–99)

## 2019-11-02 LAB — MAGNESIUM: Magnesium: 2.3 mg/dL (ref 1.7–2.4)

## 2019-11-02 LAB — PHOSPHORUS: Phosphorus: 3.4 mg/dL (ref 2.5–4.6)

## 2019-11-02 MED ORDER — PROSOURCE TF PO LIQD
45.0000 mL | Freq: Two times a day (BID) | ORAL | Status: DC
  Administered 2019-11-02 – 2019-11-09 (×14): 45 mL
  Filled 2019-11-02 (×14): qty 45

## 2019-11-02 MED ORDER — SODIUM CHLORIDE 0.9 % IV BOLUS
500.0000 mL | Freq: Once | INTRAVENOUS | Status: AC
  Administered 2019-11-02: 500 mL via INTRAVENOUS

## 2019-11-02 MED ORDER — PHENYLEPHRINE HCL-NACL 10-0.9 MG/250ML-% IV SOLN: 0.0000 ug/min | INTRAVENOUS | Status: DC

## 2019-11-02 MED ORDER — VITAL AF 1.2 CAL PO LIQD
1000.0000 mL | ORAL | Status: DC
  Administered 2019-11-03 – 2019-11-09 (×7): 1000 mL

## 2019-11-02 MED ORDER — BETHANECHOL CHLORIDE 5 MG PO TABS: 5.0000 mg | ORAL_TABLET | Freq: Three times a day (TID) | ORAL | Status: DC

## 2019-11-02 MED ORDER — POTASSIUM CHLORIDE 20 MEQ/15ML (10%) PO SOLN
40.0000 meq | Freq: Once | ORAL | Status: AC
  Administered 2019-11-02: 40 meq
  Filled 2019-11-02: qty 30

## 2019-11-02 MED ORDER — BETHANECHOL CHLORIDE 5 MG PO TABS
5.0000 mg | ORAL_TABLET | Freq: Three times a day (TID) | ORAL | Status: DC
  Administered 2019-11-02 – 2019-11-09 (×21): 5 mg
  Filled 2019-11-02 (×23): qty 1

## 2019-11-02 NOTE — Progress Notes (Signed)
Pt's SBP is less than goal of 100. Elink made aware. Will continue to monitor.

## 2019-11-02 NOTE — Evaluation (Signed)
Physical Therapy Evaluation Patient Details Name: Chelsea Allison MRN: [id number] DOB: 1955/11/29 Today's Date: 11/02/2019   History of Present Illness  64 year old female who recently had prolonged hospitalization at [facility] for acute hypoxemic respiratory failure with difficulty weaning from the vent s/p trach 7/6. She was discharged to [facility] for further respiratory management and weaning. On 8/19 patient had [place] called and resident team and anesthesia arrived. Patient had bleeding airway and respiratory arrest in the 60s. Left chest needle decompression performed for possible pneumothorax. PCCM was called to the bedside by anesthesia for assistance of bleeding airway..  Decision made to transfer pt directly to [facility] medical ICU.   Tubac: STEMI, neuropathy, ICM, HT, COPD, HF, lung CA  Clinical Impression  Pt admitted with/for bleeding around trach, respiratory arrest and found to have L thalamic bleed with R hemiplega.  Pt currently limited functionally due to the problems listed. ( See problems list.)   Pt will benefit from PT to maximize function and safety in order to get ready for next venue listed below.     Follow Up Recommendations LTACH    Equipment Recommendations  None recommended by PT    Recommendations for Other Services       Precautions / Restrictions Precautions Precautions: Fall      Mobility  Bed Mobility Overal bed mobility: Needs Assistance Bed Mobility: Supine to Sit;Sit to Supine     Supine to sit: Total assist Sit to supine: Total assist   General bed mobility comments: pt unable to follow commands and cues.  Attempted assist with L UE not coordinated.  Transfers                 General transfer comment: transfers deferred due to pt started not tolerating sitting due to mistakenly on wean vs full support mode.  Ambulation/Gait                Stairs            Wheelchair Mobility    Modified Rankin (Stroke Patients  Only) Modified Rankin (Stroke Patients Only) Pre-Morbid Rankin Score: Moderate disability Modified Rankin: Severe disability     Balance Overall balance assessment: Needs assistance Sitting-balance support: Feet supported Sitting balance-Leahy Scale: Zero Sitting balance - Comments: no discernable truncal reactions.                                     Pertinent Vitals/Pain Pain Assessment: Faces Faces Pain Scale: No hurt Pain Intervention(s): Monitored during session    Home Living Family/patient expects to be discharged to:: Other (Comment) (back to [facility])                      Prior Function                 Hand Dominance   Dominant Hand: Right    Extremity/Trunk Assessment   Upper Extremity Assessment Upper Extremity Assessment: LUE deficits/detail;RUE deficits/detail RUE Deficits / Details: did not observe or feeling any spontaneous movement. RUE Coordination: decreased gross motor;decreased fine motor LUE Deficits / Details: weak, restlessly moving against gravity, grabbing the rail LUE Coordination: decreased fine motor    Lower Extremity Assessment Lower Extremity Assessment: RLE deficits/detail;LLE deficits/detail RLE Deficits / Details: good PROM, noticed no spontaneous movement, did not note any movement to command during ROM/MMT. LLE Deficits / Details: movement spontaneously and resists movement during  ROM LLE Coordination: decreased fine motor       Communication   Communication: Tracheostomy  Cognition Arousal/Alertness: Awake/alert Behavior During Therapy: Flat affect Overall Cognitive Status:  (NT, but pt not able to sustain focus on evaluation.)                                 General Comments: pt did not make attempts to communicate.      General Comments General comments (skin integrity, edema, etc.):  Initially on CPAP at 40 FiO2 sattomg a 91%.  Maintained well during MMT/ROM exercise.  When  transitioned to sitting, sats slowly began to drop through the 80's to the higher 70's, noting then that pt was getting gray and pale.  Got RN to assist back to supine at about the same time that RT, seeing degrading respiratory status, came in to find pt on wean mode insteady of full support.  Once on support her Sats rebounded, color returned, but RR still in the upper 20's lower 30's with lower tidal volumes in the 200's  Tachy in the 110's and 120''s    Exercises Other Exercises Other Exercises: warm up LE ROM exercise   Assessment/Plan    PT Assessment Patient needs continued PT services  PT Problem List Decreased strength;Decreased activity tolerance;Decreased balance;Decreased mobility;Decreased coordination;Cardiopulmonary status limiting activity       PT Treatment Interventions DME instruction;Functional mobility training;Therapeutic activities;Therapeutic exercise;Balance training;Patient/family education;Other (comment) (pre gait)    PT Goals (Current goals can be found in the Care Plan section)  Acute Rehab PT Goals Patient Stated Goal: go home PT Goal Formulation: With patient Time For Goal Achievement: 11/16/19 Potential to Achieve Goals: Fair    Frequency Min 2X/week   Barriers to discharge        Co-evaluation               AM-PAC PT "6 Clicks" Mobility  Outcome Measure Help needed turning from your back to your side while in a flat bed without using bedrails?: Total Help needed moving from lying on your back to sitting on the side of a flat bed without using bedrails?: Total Help needed moving to and from a bed to a chair (including a wheelchair)?: Total Help needed standing up from a chair using your arms (e.g., wheelchair or bedside chair)?: Total Help needed to walk in hospital room?: Total Help needed climbing 3-5 steps with a railing? : Total 6 Click Score: 6    End of Session Equipment Utilized During Treatment: Oxygen Activity Tolerance:  Patient tolerated treatment well Patient left: in bed;with call bell/phone within reach Nurse Communication: Mobility status PT Visit Diagnosis: Other abnormalities of gait and mobility (R26.89);Other symptoms and signs involving the nervous system (R29.898);Muscle weakness (generalized) (M62.81)    Time: 8502-7741 PT Time Calculation (min) (ACUTE ONLY): 20 min   Charges:   PT Evaluation $PT Eval High Complexity: 1 High          11/02/2019  [name], PT Acute Rehabilitation Services [phone]  (pager) [phone]  (office)  Tessie Fass Shanikka Wonders 11/02/2019, 12:43 PM

## 2019-11-02 NOTE — Progress Notes (Signed)
SLP Cancellation Note  Patient Details Name: Chelsea Allison MRN: [id number] DOB: 04-May-1955   Cancelled treatment:         Pt on vent as well as labored breathing. Will follow along for PMV   Houston Siren 11/02/2019, 2:58 PM

## 2019-11-02 NOTE — Progress Notes (Signed)
RT in to do routine vent/trach check to find pt on CPAP/PS. Pt desaturating to the 60's, increased work of breathing and tachypnea. Pt returned to full support, FiO2 increased to 100% and PC setting increased to support lower tidal volumes. Pt recovered to 98%. Dr. Tacy Learn and Eddie Dibbles NP CCM notified. RT will continue to monitor and wean PC vent setting as pt tidal volumes allow.

## 2019-11-02 NOTE — Progress Notes (Signed)
CPT held at this time due to increased oxygen needs/ desaturation. Pt with significant increased work of breathing.

## 2019-11-02 NOTE — Progress Notes (Signed)
NAME:  Chelsea Allison, MRN:  [id number], DOB:  March 03, 1956, LOS: 7 ADMISSION DATE:  10/26/2019, CONSULTATION DATE:  10/26/19 REFERRING MD:  Versie Starks, CHIEF COMPLAINT:  Respiratory failure   Brief History   3 yof admitted at [facility] 6/29-7/28 for acute respiratory failure 2/2 HCAP/COPD exacerbation. She was unable to be weaned off the vent and was eventually trached on 7/6. She was discharged to [facility]. She was readmitted to [facility] on 8/19 for acute on chronic respiratory failure 2/2 hemorrhaging  from her tracheostomy.   Past Medical History  COPD, AoC respiratory failure, CAD, CHF, HTN  Significant Hospital Events   8/19 admitted to PCCM  Consults:  n/a  Procedures:  8/19 intubated, 8/20 self extubated, 8/20 reintubated due to respiratory distress. 8/19 bronchoscopy [facility] CVL 8/19 >  Significant Diagnostic Tests:  8/19 CXR>>enlarging right pleural effusion with increasing right basilar atelectic changes and subtotal collapse of RML and RLL 8/19 chest x-ray>>Veiling opacity in the right lung favored due to a combination of pleural effusion and atelectasis. Improving ventilation in the left lung since 10/10/2019 with stable residual left upper lung reticulonodular opacity. 8/19 echocardiogram >> EF of 20 to 25% with global akinesis of left ventricle.  Cannot completely exclude mural thrombus at left ventricular apex.  Right ventricular systolic function is mildly reduced with normal size.The inferior vena cava is dilated in size with <50% respiratory  variability, suggesting right atrial pressure of 15 mmHg.  8/20 echocardiogram>> no apical thrombus, EF 25-30% CT head 8/25 >  10 cc acute hematoma centered at the left thalamus, usually hypertensive in this location. MRI/A Brain 8/26 > Similar size/appearance of the acute hemorrhage centered in the left thalamus. Surrounding edema and rightward midline shift. No acute vascular abnormality or underlying vascular lesion identified. Diffuse T1  hypointensity and heterogeneity of the marrow without discrete marrow replacing lesion.  Micro Data:  Blood culture 8/19 > negative  Antimicrobials:  Vancomycin 8/19 - 8/21 Zosyn 8/19 > 8/25  Interim history/subjective:  SBT most of the day yesterday, but around 1900 she became lethargic and hypotensive. ABG showed hypercarbia/hypoxia. She was placed back on full support and she improved. There was an episode of hypotension, pressors were ordered, but it appears as if they were not started.   Objective   Blood pressure 104/62, pulse 77, temperature 99 F (37.2 C), temperature source Axillary, resp. rate 12, height 5\' 9"  (1.753 m), weight 82.6 kg, SpO2 98 %. CVP:  [7 mmHg-8 mmHg] 7 mmHg  Vent Mode: PCV FiO2 (%):  [40 %-100 %] 40 % Set Rate:  [25 bmp-30 bmp] 30 bmp Vt Set:  [390 mL] 390 mL PEEP:  [5 cmH20] 5 cmH20 Pressure Support:  [12 cmH20] 12 cmH20 Plateau Pressure:  [27 cmH20-28 cmH20] 27 cmH20   Intake/Output Summary (Last 24 hours) at 11/02/2019 0802 Last data filed at 11/02/2019 0700 Gross per 24 hour  Intake 3091.55 ml  Output 2100 ml  Net 991.55 ml   Filed Weights   10/31/19 0500 11/01/19 0350 11/02/19 0425  Weight: 83.7 kg 85.7 kg 82.6 kg     Physical exam:  General: Chronically ill female on vent HEENT: 6 shiley cuffed trach in place. Sutures remain.  Neuro: alert, eyes open, Follows commands on the L. R facial droop. CV: RRR, no MRG PULM:  Coarse bilaterally.  GI: Soft, non-tender, non-distended. G tube site CDI, Stoma pink. Raised erythema in the RLQ, at SQ injections sites.  Extremities: no acute deformity, no edema.  Skin: Grossly intact  Resolved Hospital Problem list   Acute kidney injury  Assessment & Plan:  Acute on chronic hypoxic and hypercarbic respiratory failure due to aspiration pneumonia  Hemorrhage from tracheostomy site Severe COPD, not in exacerbation -Readmitted from [facility] in acute respiratory distress and hemorrhage from airway. She  was seen with significant hypoxia  -Redo tracheostomy completed 8/22 P: Continue ventilator support with lung protective strategies  Now on pressure control with insp pressure 22, rate 24, PEEP 5, 40% Will hold off on SBT today after decompensation yesterday. Resume 8/27. Follow cultures  VAP bundle in place  PAD protocol  Left thalamic ICH: Patient was seen on AM assessment 8/24 by nurse to have new right side flaccidity with right facial droop and decreased alertness.  P: Mangment per neuro Keep SBP < 17mmHg Holding DAPT until OK with neurology. Per cards when given the OK will go with Plavix 75mg  monotherapy.   Acute metabolic encephalopathy due to hypercapnia: improved P: Minimize sedation  Delirium precautions  ICH workup as above   Chronic systolic and diastolic congestive heart failure Coronary artery disease -S/p STEMI with placement of DES 12/2018 on Brillinta, Per cardiology plan is to continue ASA/Brilinta/stain for 1 year P: Monitor I&O  Daily weight    Hypophosphatemia, resolved  Hypernatremia  P: Continue free water  Trend Bmet   Acute on chronic anemia due to blood loss from tracheostomy site -Readmitted to PCCM on 8/19 for acute on chronic respiratory failure 2/2 hemorrhaging  from her tracheostomy.  P: Hemoglobin goal greater than 7   Raised erythematous rash on RLQ of abdomen: from SQ injections vs evolving cellulitis. Just finished a course of Zosyn so less likely infection.  - Monitor daily.   Best practice:  Diet: NPO, tube feed Pain/Anxiety/Delirium protocol (if indicated): Fentanyl  VAP protocol (if indicated): yes DVT prophylaxis: None GI prophylaxis: PPI Glucose control: Sliding scale Mobility: BR Code Status: DNR Family Communication: Son updated via phone Disposition: ICU   Georgann Housekeeper, AGACNP-BC [place]  See Amion for personal pager PCCM on call pager [phone]  11/02/2019 8:02 AM

## 2019-11-02 NOTE — Progress Notes (Signed)
STROKE TEAM PROGRESS NOTE   INTERVAL HISTORY RN at bedside. Pt lying in bed, neuro stable. Yesterday evening, pt had desat and hypotensive. ABG showed hypoxia and hypercarbia.  Put back on vent with full vent support and improved.  Today, she worked with PT again had desat, put back to bed and improved.  Currently still BP soft, 90s.  Still on fentanyl.  Vitals:   11/02/19 0800 11/02/19 0900 11/02/19 1000 11/02/19 1100  BP: 116/77 (!) 106/56 123/62 (!) 151/85  Pulse: 83 86 88 (!) 101  Resp: (!) 23 (!) 25 (!) 29 (!) 38  Temp:    99.5 F (37.5 C)  TempSrc:    Oral  SpO2: 98% 97% 98% 96%  Weight:      Height:       CBC:  Recent Labs  Lab 11/01/19 0038 11/01/19 1951 11/01/19 2120 11/02/19 0414  WBC 11.7*  --   --  11.3*  HGB 7.8*   < > 8.5* 7.3*  HCT 27.3*   < > 25.0* 26.2*  MCV 95.1  --   --  97.8  PLT 363  --   --  346   < > = values in this interval not displayed.   Basic Metabolic Panel:  Recent Labs  Lab 10/29/19 0427 10/30/19 0448 11/01/19 0038 11/01/19 1951 11/01/19 2120 11/02/19 0414  NA 148*   < > 148*   < > 145 142  K 3.7   < > 3.2*   < > 3.6 3.5  CL 106   < > 104  --   --  102  CO2 30   < > 34*  --   --  30  GLUCOSE 104*   < > 134*  --   --  101*  BUN 30*   < > 43*  --   --  44*  CREATININE 0.84   < > 1.01*  --   --  0.87  CALCIUM 9.6   < > 9.6  --   --  9.1  MG 2.5*  --   --   --   --  2.3  PHOS 2.7  --   --   --   --  3.4   < > = values in this interval not displayed.   Lipid Panel:  Recent Labs  Lab 11/01/19 0038  CHOL 124  TRIG 133  HDL 27*  CHOLHDL 4.6  VLDL 27  LDLCALC 70   HgbA1c:  Recent Labs  Lab 10/26/19 1424  HGBA1C 4.8   Urine Drug Screen: No results for input(s): LABOPIA, COCAINSCRNUR, LABBENZ, AMPHETMU, THCU, LABBARB in the last 168 hours.  Alcohol Level No results for input(s): ETH in the last 168 hours.  IMAGING past 24 hours DG CHEST PORT 1 VIEW  Result Date: 11/02/2019 CLINICAL DATA:  Hypoxia. EXAM: PORTABLE CHEST 1  VIEW COMPARISON:  10/28/2019, 10/27/2019.  CT 10/26/2019. FINDINGS: Left IJ line appears to be in good anatomic position with tip in the SVC on today's exam. Tracheostomy tube in stable position. Heart size normal. Post therapeutic changes again noted over the right hilum. Stable mild bilateral interstitial prominence. No pleural effusion or pneumothorax. IMPRESSION: 1. Left IJ line appears to be in good anatomic position with tip in SVC on today's exam. Tracheostomy tube in stable position. 2. Post therapeutic changes right hilum again noted. Unchanged bilateral interstitial prominence. Electronically Signed   By: Marcello Moores  Register   On: 11/02/2019 05:56    PHYSICAL EXAM  Temp:  [98.9 F (37.2 C)-99.7 F (37.6 C)] 99.5 F (37.5 C) (08/26 1100) Pulse Rate:  [65-108] 101 (08/26 1100) Resp:  [10-38] 38 (08/26 1100) BP: (87-164)/(50-85) 151/85 (08/26 1100) SpO2:  [92 %-100 %] 96 % (08/26 1100) FiO2 (%):  [40 %-100 %] 40 % (08/26 0323) Weight:  [82.6 kg] 82.6 kg (08/26 0425)  General - Well nourished, well developed, in no apparent distress, on trach with vent support.  Ophthalmologic - fundi not visualized due to noncooperation.  Cardiovascular - Regular rhythm and rate.  Neuro - awake, eyes open, nonverbal, eyes left gaze preference, however able to track to right, but incomplete right gaze.  Blinking to visual threat bilaterally.  PERRL.  Able to follow limited midline and peripheral commands on the left, such as close eyes and make fist, however, did not open mouth, or show two fingers or moving toes on the left.  Left upper extremity at least 4/5, spontaneous movement.  Left lower extremity 3 -/5 with pain.  Right upper and lower extremity flaccid, with only slight withdraw at RLE with pain. Sensation, coordination and gait not tested.  ASSESSMENT/PLAN Ms. Amatullah Christy is a 64 y.o. female with history of acute on chronic respiratory failure with hypoxia since 08/2019 with recurrent  admission and [facility] and readmission, eventually had trach 09/12/2019.  However readmitted due to trach site bleeding with hemoptysis.  Status post bronchoscopy and intubation.  Now extubated back on trach.  Brilinta restarted.  However in hospital developed R sided paralysis 10/31/2019. CT shows L thalamic ICH. Administered DDAVP as she was on Brilinta.   Stroke:   L thalamic ICH secondary to small vessel disease source  CT head left thalamic ICH  Status post DDAVP  MRI left thalamic centered ICH, no infarct  MRA unremarkable  Carotid Doppler unremarkable  2D Echo EF 25 to 30%, no LV thrombus  LE venous Doppler no DVT  LDL 70  HgbA1c 4.8  VTE prophylaxis -SCDs  Brilinta (ticagrelor) 90 mg bid prior to admission, now on No antithrombotic  due to [place].  Agree with cardiology, once ICH resolves or near resolving, restart Plavix for cardiac and stroke prevention. Consider to repeat CT head in about 2 weeks for re-evaluation of left thalamic ICH  Therapy recommendations:  LTACH  Disposition:  Pending   Son considering palliative care. Discussing w/ family  Recurrent respiratory failure, acute on chronic d/t aspiration PNA Hemorrhage from trach site Severe COPD  Redo trach 8/22  On vent  CCM admission   Fever and leukocytosis  T-max 101.8  WBC 11.7->11.3  UA WBC 11-20  Blood culture 8/19 no growth  completed Zosyn course    CHF, acute on chronic systolic and diastolic CAD  Coronary artery disease, hx MI s/p DES to RCA & Cx 57/3220  Chronic systolic Congestive heart failure w/ low EF 25-30%  Was on aspirin and Brilinta  Brilinta currently discontinued due to [place]  Agree with cardiology, once ICH resolved or near resolving, restarted Plavix. Consider to repeat CT head in 2 week to evaluate ICH   Hypotension Hx Hypertension  on the low side - bolus w/ fluid and started on phenylephrine during the night, now off  . SBP goal < 140 from hemorrhage  standpoint . Long-term BP goal normotensive  Hyperlipidemia  Home meds: Crestor 20  LDL 70, goal < 70  Now on Lipitor 40  Continue statin at discharge  Dysphagia . NPO . On tube feeding . On free  water  Other Stroke Risk Factors  Cigarette smoker  Overweight, Body mass index is 26.89 kg/m., recommend weight loss, diet and exercise as appropriate   Other Active Problems  Hx lung cancer  Hx colon cancer  Acute metabolic encephalopathy due to hypercapnia  Hypernatremia - on free water  Acute on chronic anemia due to blood loss from tracheostomy site, transfused 8/24  Raised erythemous rash RLQ abd from injections vs cellulitis  Urinary retention on urecholine  Hospital day # 7  Neurology will follow peripherally. Please call with questions. Also please call us back once repeat CT head done in 2 weeks. Thanks for the consult.   Rosalin Hawking, MD PhD Stroke Neurology 11/02/2019 1:41 PM   To contact Stroke Continuity provider, please refer to [email or web address] After hours, contact General Neurology

## 2019-11-02 NOTE — Progress Notes (Signed)
eLink Physician-Brief Progress Note Patient Name: Livia Tarr DOB: Oct 18, 1955 MRN: [id number]   Date of Service  11/02/2019  HPI/Events of Note  Hypotension - BP = 87/55 with MAP = 66. CVP = 7.   eICU Interventions  Plan: 1. Bolus with 0.9 NaCl 500 mL IV over 30 minutes now. 2. Phenylephrine iV infusion. Titrate to MAP >= 65.      Intervention Category Major Interventions: Hypotension - evaluation and management  Trammell Bowden Eugene 11/02/2019, 4:12 AM

## 2019-11-02 NOTE — Progress Notes (Signed)
PCCM INTERVAL PROGRESS NOTE   Spoke with son Gerald Stabs a length regarding goals of care. He is aware of multiple setbacks in her recovery including ICH and higher vent needs. He knows that she was not ready to die, but also doesn't want so suffer and struggle. He knows that palliative care has been and remains an option, and now as her chances of meaningful recovery seems less and less (not able to live outside of nursing facility for the foreseeable future) he is considering it. He is going to discuss with his family and we will talk again tomorrow morning.     Georgann Housekeeper, AGACNP-BC [place]  See Amion for personal pager PCCM on call pager [phone]  11/02/2019 11:42 AM

## 2019-11-02 NOTE — Progress Notes (Signed)
Occupational Therapy Evaluation  PTA, pt was participating in rehab at [place]. PT attempted to call for status, however unable to get any information. Pt with generalized weakness in LUE, however moving within functional ROM. Flaccid RUE. Inconsistently nodding head yes/no and following simple 1 step commands. Shaking L hand trying to shake off glove. At end of session - pt reaching for her IV pole. Recommend prevalon boot for R foot. Anticipate DC back to [facility] for continued rehab. Will follow acutely.    11/02/19 1210  OT Visit Information  Last OT Received On 11/02/19  Assistance Needed +2  History of Present Illness 64 year old female who recently had prolonged hospitalization at [facility] for acute hypoxemic respiratory failure with difficulty weaning from the vent s/p trach 7/6. She was discharged to [facility] for further respiratory management and weaning. On 8/19 patient had [place] called and resident team and anesthesia arrived. Patient had bleeding airway and respiratory arrest in the 60s. Left chest needle decompression performed for possible pneumothorax. PCCM was called to the bedside by anesthesia for assistance of bleeding airway..  Decision made to transfer pt directly to [facility] medical ICU.   [place]: STEMI, neuropathy, ICM, HT, COPD, HF, lung CA  Precautions  Precautions Fall  Precaution Comments trach, cortrak, ostomy, sacral wounds  Home Living  Family/patient expects to be discharged to: Other (Comment) (back to [facility])  Prior Function  Level of Independence Independent  Comments family or friend does grocery shopping (was participating in ADL adn mobility prior to DC t [facility])  Communication  Communication Tracheostomy  Pain Assessment  Pain Assessment Faces  Faces Pain Scale 0  Pain Intervention(s) Monitored during session  Cognition  Arousal/Alertness Awake/alert  Behavior During Therapy Flat affect  Overall Cognitive Status Difficult to assess (NT, but pt not able to  sustain focus on evaluation.)  General Comments nodding head yes/no, but not consistently  Difficult to assess due to Tracheostomy  Upper Extremity Assessment  Upper Extremity Assessment LUE deficits/detail;RUE deficits/detail  RUE Deficits / Details appeasr flaccid; no spontaneous movemetn  RUE Coordination decreased gross motor;decreased fine motor  LUE Deficits / Details generazlized weakness; moving tocommand but appears to have difficulty coordinating movements;   LUE Coordination decreased fine motor  Lower Extremity Assessment  Lower Extremity Assessment RLE deficits/detail;LLE deficits/detail  RLE Deficits / Details good PROM, noticed no spontaneous movement, did not note any movement to command during ROM/MMT.  LLE Deficits / Details movement spontaneously and resists movement during ROM  LLE Coordination decreased fine motor  Cervical / Trunk Assessment  Cervical / Trunk Assessment Other exceptions  Cervical / Trunk Exceptions R bias in supported sitting in ed  ADL  Grooming Maximal assistance  Grooming Details (indicate cue type and reason) wshing face  Upper Body Bathing Maximal assistance;Bed level  Lower Body Bathing Total assistance  Upper Body Dressing  Total assistance  Lower Body Dressing Total assistance  Functional mobility during ADLs Maximal assistance;+2 for physical assistance  Vision- Assessment  Vision Assessment? Vision impaired- to be further tested in functional context (note decreased visual attention)  Perception  Comments will further assess  Praxis  Praxis tested? Deficits  Deficits Initiation (will further assess)  Bed Mobility  Rolling Max assist (rolling toward L; total A roll toward L)  General bed mobility comments supine - sit not attmepted - completed earlier withPT  Transfers  General transfer comment transfers deferred due to pt started not tolerating sitting due to mistakenly on wean vs full support mode.  Balance  Overall balance  assessment Needs assistance  Sitting-balance support Bilateral upper extremity supported  Sitting balance-Leahy Scale Zero  Sitting balance - Comments R bias however pt pushing on bed rail with LUE  General Comments  General comments (skin integrity, edema, etc.) seen on full support  Exercises  Exercises Other exercises;General Upper Extremity  General Exercises - Upper Extremity  Shoulder Flexion Left;AROM;10 reps;Supine  Shoulder ABduction Left;AROM;10 reps;Supine  Shoulder Horizontal ABduction AROM;Left;10 reps;Supine  Elbow Flexion AROM;Left;10 reps;Seated  Elbow Extension AROM;Left;10 reps;Seated  Digit Composite Flexion AROM;Left;10 reps;Seated  Composite Extension AROM;Left;10 reps;Seated  Other Exercises  Other Exercises RUE PROM through full ROM  OT - End of Session  Equipment Utilized During Treatment Other (comment) (full vent support)  Activity Tolerance Patient tolerated treatment well  Patient left in bed;with call bell/phone within reach  Nurse Communication Mobility status;Other (comment) (recommend Prevalon R foot)  OT Assessment  OT Recommendation/Assessment Patient needs continued OT Services  OT Visit Diagnosis Other abnormalities of gait and mobility (R26.89);Muscle weakness (generalized) (M62.81);Other symptoms and signs involving cognitive function;Hemiplegia and hemiparesis  Hemiplegia - Right/Left Right  Hemiplegia - dominant/non-dominant Dominant  Hemiplegia - caused by Cerebral infarction  OT Problem List Decreased strength;Decreased range of motion;Decreased activity tolerance;Impaired balance (sitting and/or standing);Impaired vision/perception;Decreased coordination;Decreased cognition;Decreased safety awareness;Decreased knowledge of use of DME or AE;Cardiopulmonary status limiting activity;Impaired sensation;Impaired tone;Obesity;Impaired UE functional use  OT Plan  OT Frequency (ACUTE ONLY) Min 2X/week  OT Treatment/Interventions (ACUTE ONLY)  Self-care/ADL training;Therapeutic exercise;Neuromuscular education;DME and/or AE instruction;Splinting;Therapeutic activities;Cognitive remediation/compensation;Visual/perceptual remediation/compensation;Patient/family education;Balance training  AM-PAC OT "6 Clicks" Daily Activity Outcome Measure (Version 2)  Help from another person eating meals? 1  Help from another person taking care of personal grooming? 2  Help from another person toileting, which includes using toliet, bedpan, or urinal? 1  Help from another person bathing (including washing, rinsing, drying)? 2  Help from another person to put on and taking off regular upper body clothing? 1  Help from another person to put on and taking off regular lower body clothing? 1  6 Click Score 8  OT Recommendation  Follow Up Recommendations Other (comment);LTACH (return to [facility])  OT Equipment None recommended by OT  Individuals Consulted  Consulted and Agree with Results and Recommendations Patient unable/family or caregiver not available  Acute Rehab OT Goals  Patient Stated Goal go home  OT Goal Formulation Patient unable to participate in goal setting  Time For Goal Achievement 11/16/19  Potential to [place]  OT Time Calculation  OT Start Time (ACUTE ONLY) 1235  OT Stop Time (ACUTE ONLY) 1250  OT Time Calculation (min) 15 min  OT General Charges  $OT Visit 1 Visit  OT Evaluation  $OT Eval Moderate Complexity 1 Mod  Written Expression  Dominant Hand Right  Maurie Boettcher, OT/L   Acute OT Clinical Specialist Acute Rehabilitation Services Pager [phone] Office [phone]

## 2019-11-02 NOTE — Progress Notes (Signed)
Nutrition Follow-up  DOCUMENTATION CODES:   Not applicable  INTERVENTION:   Continue TF via G-tube: Vital AF 1.2 increase goal rate to 60 ml/h (1440 ml per day) Prosource TF 45 ml decrease to BID Free water flushes 200 ml every 3 hours  Provides 1808 kcal, 130 gm protein, 2768 ml free water total daily  NUTRITION DIAGNOSIS:   Inadequate oral intake related to inability to eat as evidenced by NPO status.  Ongoing   GOAL:   Provide needs based on ASPEN/SCCM guidelines  Met with TF  MONITOR:   Vent status, Labs, Weight trends, TF tolerance, Skin, I & O's  REASON FOR ASSESSMENT:   Ventilator    ASSESSMENT:   64 year old female who presented from [facility] on 8/19 with acute on chronic respiratory failure secondary to hemorrhaging from tracheostomy. PMH of recent prolonged hospital admission at [facility] for acute hypoxemic respiratory failure secondary to HCAP/COPD exacerbation with difficulty weaning from the vent s/p trach, COPD, CAD, CHF, HTN, NSCLC s/p partial lobe resection and radiotherapy, rectal cancer s/p colectomy.  Discussed patient in ICU rounds and with RN today. Lurline Idol was replaced on 8/22.  Patient had hemorrhagic stroke on 8/24. Tolerating TF well at goal rate via G-tube to meet nutrition needs.  Receiving free water flushes 200 ml every 3 hours.  Patient is currently intubated on ventilator support MV: 12 L/min Temp (24hrs), Avg:99.2 F (37.3 C), Min:98.9 F (37.2 C), Max:99.7 F (37.6 C)   Labs reviewed.  CBG: [phone]  Medications reviewed and include Colace, Novolog, Miralax, Senokot-S.  Colostomy output 520 ml x 24 hours UOP 1,670 x 24 hours I/O +4.2 L since admission   Diet Order:   Diet Order            Diet NPO time specified  Diet effective now                 EDUCATION NEEDS:   No education needs have been identified at this time  Skin:  Skin Assessment: Skin Integrity Issues: Skin Integrity Issues:: Stage III Stage III:  sacrum  Last BM:  520 ml x 24 hours via colostomy  Height:   Ht Readings from Last 1 Encounters:  10/26/19 5' 9" (1.753 m)    Weight:   Wt Readings from Last 1 Encounters:  11/02/19 82.6 kg    Ideal Body Weight:  65.9 kg  BMI:  Body mass index is 26.89 kg/m.  Estimated Nutritional Needs:   Kcal:  0388  Protein:  120-140 grams  Fluid:  >/= 1.8 L    Lucas Mallow, RD, LDN, CNSC Please refer to Amion for contact information.

## 2019-11-02 NOTE — Progress Notes (Signed)
eLink Physician-Brief Progress Note Patient Name: Chelsea Allison DOB: 11-Jul-1955 MRN: [id number]   Date of Service  11/02/2019  HPI/Events of Note  Hypotension - BP = 89/54. LVEF = 25-30%. Hgb = 8.5.   eICU Interventions  Plan:  1. Bolus with 0.9 NaCl 500 mL IV over 30 minutes now.  2. Monitor CVP now and Q 4 hours.      Intervention Category Major Interventions: Hypotension - evaluation and management  Tedd Cottrill Eugene 11/02/2019, 12:54 AM

## 2019-11-03 ENCOUNTER — Inpatient Hospital Stay (HOSPITAL_COMMUNITY): Payer: Medicare HMO

## 2019-11-03 DIAGNOSIS — E876 Hypokalemia: Secondary | ICD-10-CM

## 2019-11-03 LAB — CBC
HCT: 28.1 % — ABNORMAL LOW (ref 36.0–46.0)
Hemoglobin: 7.7 g/dL — ABNORMAL LOW (ref 12.0–15.0)
MCH: 26.2 pg (ref 26.0–34.0)
MCHC: 27.4 g/dL — ABNORMAL LOW (ref 30.0–36.0)
MCV: 95.6 fL (ref 80.0–100.0)
Platelets: 340 10*3/uL (ref 150–400)
RBC: 2.94 MIL/uL — ABNORMAL LOW (ref 3.87–5.11)
RDW: 18.1 % — ABNORMAL HIGH (ref 11.5–15.5)
WBC: 11.5 10*3/uL — ABNORMAL HIGH (ref 4.0–10.5)
nRBC: 0.3 % — ABNORMAL HIGH (ref 0.0–0.2)

## 2019-11-03 LAB — MAGNESIUM: Magnesium: 2.4 mg/dL (ref 1.7–2.4)

## 2019-11-03 LAB — BASIC METABOLIC PANEL
Anion gap: 8 (ref 5–15)
BUN: 39 mg/dL — ABNORMAL HIGH (ref 8–23)
CO2: 29 mmol/L (ref 22–32)
Calcium: 9.5 mg/dL (ref 8.9–10.3)
Chloride: 106 mmol/L (ref 98–111)
Creatinine, Ser: 0.67 mg/dL (ref 0.44–1.00)
GFR calc Af Amer: >60 mL/min (ref >60)
GFR calc non Af Amer: >60 mL/min (ref >60)
Glucose, Bld: 137 mg/dL — ABNORMAL HIGH (ref 70–99)
Potassium: 3.4 mmol/L — ABNORMAL LOW (ref 3.5–5.1)
Sodium: 143 mmol/L (ref 135–145)

## 2019-11-03 LAB — PHOSPHORUS: Phosphorus: 3.1 mg/dL (ref 2.5–4.6)

## 2019-11-03 LAB — GLUCOSE, CAPILLARY
Glucose-Capillary: 114 mg/dL — ABNORMAL HIGH (ref 70–99)
Glucose-Capillary: 131 mg/dL — ABNORMAL HIGH (ref 70–99)
Glucose-Capillary: 121 mg/dL — ABNORMAL HIGH (ref 70–99)
Glucose-Capillary: 124 mg/dL — ABNORMAL HIGH (ref 70–99)
Glucose-Capillary: 287 mg/dL — ABNORMAL HIGH (ref 70–99)
Glucose-Capillary: 119 mg/dL — ABNORMAL HIGH (ref 70–99)

## 2019-11-03 MED ORDER — POTASSIUM CHLORIDE 20 MEQ/15ML (10%) PO SOLN
40.0000 meq | Freq: Once | ORAL | Status: AC
  Administered 2019-11-03: 40 meq
  Filled 2019-11-03: qty 30

## 2019-11-03 NOTE — Progress Notes (Signed)
~  1430 pt was restless, pulling at trach through mitten (LUE), reminded not to pull at it several times and stated if cont'd would need to restrain; vent alarm went off and pt had disconnected herself; klonopin given, MD notified, soft wrist restraint applied.   ~1600 pt w increased WOB, diaphoretic, HR 120s, reddened face, sats in 80's; suctioned w/ pink tinged secretions; RT notified; suctioned again, large red clot clogged suction tubing.  RT changed, gave neb, I admin 172mcg Fent - no improvement in WOB, sats 94%. ~1630 sats again upper 80's; lavaged; RT lavaged & bagged lavaged; gave 144mcg Fent; agonal breathing noted; MD notified; CXR ordered  O2% 70; MD stated ok with sats >85%; saturation slowly improving  Son updated

## 2019-11-03 NOTE — Progress Notes (Signed)
[facility] ADULT ICU REPLACEMENT PROTOCOL   The patient does apply for the [facility] Adult ICU Electrolyte Replacment Protocol based on the criteria listed below:   1. Is GFR >/= 30 ml/min? Yes.    Patient's GFR today is >60 2. Is SCr </= 2? Yes.   Patient's SCr is 0.67 ml/kg/hr 3. Did SCr increase >/= 0.5 in 24 hours? No. 4. Abnormal electrolyte(s): K-3.4 5. Ordered repletion with: per protocol 6. If a panic level lab has been reported, has the CCM MD in charge been notified? No..   Physician:  Dr Terrill Mohr, Philis Nettle 11/03/2019 6:40 AM

## 2019-11-03 NOTE — TOC Progression Note (Signed)
Transition of Care [facility]) - Progression Note    Patient Details  Name: Chelsea Allison MRN: [id number] Date of Birth: 12-Aug-1955  Transition of Care [facility]) CM/SW [place], RN Phone Number: 11/03/2019, 10:58 AM  Clinical Narrative:     Continues need for hospitalization, not ready yet for [facility].  Stroke hemorraghic during hospitalizationon or around  8/24 probably due to [place] long standing. Weaning off fentanyl. Ordered PRN for discomfort.     Expected Discharge Plan: Long Term Acute Care (LTAC) Barriers to Discharge: Continued Medical Work up  Expected Discharge Plan and Services Expected Discharge Plan: [place] (LTAC)                                               Social Determinants of Health (SDOH) Interventions    Readmission Risk Interventions Readmission Risk Prevention Plan 01/11/2019 12/20/2018 12/20/2018  Transportation Screening Complete - Complete  PCP or Specialist Appt within 5-7 Days - Complete -  PCP or Specialist Appt within 3-5 Days (No Data) - -  Home Care Screening - - Complete  Medication Review (RN CM) - - Complete  HRI or Home Care Consult Complete - -  Social Work Consult for [place] Planning/Counseling Complete - -  Palliative Care Screening Not Applicable - -  Medication Review (RN Care Manager) Complete - -  Some recent data might be hidden

## 2019-11-03 NOTE — Progress Notes (Signed)
SLP Cancellation Note  Patient Details Name: Chelsea Allison MRN: [id number] DOB: 03-Jul-1955   Cancelled treatment:       Reason Eval/Treat Not Completed: Patient not medically ready. Pt on vent, will f/u for PMSV readiness.    Caliber Landess, Katherene Ponto 11/03/2019, 10:48 AM

## 2019-11-03 NOTE — Progress Notes (Signed)
NAME:  Chelsea Allison, MRN:  [id number], DOB:  Jul 24, 1955, LOS: 8 ADMISSION DATE:  10/26/2019, CONSULTATION DATE:  10/26/19 REFERRING MD:  Versie Starks, CHIEF COMPLAINT:  Respiratory failure   Brief History   64 y o F admitted at [facility] 6/29-7/28 for acute respiratory failure 2/2 HCAP/COPD exacerbation. She was unable to be weaned off the vent and was eventually trached on 7/6. She was discharged [facility]. She was readmitted to [facility] on 8/19 for acute on chronic respiratory failure 2/2 hemorrhaging  from her tracheostomy.   Past Medical History  COPD, AoC respiratory failure, CAD, CHF, HTN  Significant Hospital Events   8/19 admitted to PCCM  Consults:  n/a  Procedures:  8/19 intubated, 8/20 self extubated, 8/20 reintubated due to respiratory distress. 8/19 bronchoscopy [facility] CVL 8/19 >  Significant Diagnostic Tests:  8/19 CXR>>enlarging right pleural effusion with increasing right basilar atelectic changes and subtotal collapse of RML and RLL 8/19 chest x-ray>>Veiling opacity in the right lung favored due to a combination of pleural effusion and atelectasis. Improving ventilation in the left lung since 10/10/2019 with stable residual left upper lung reticulonodular opacity. 8/19 echocardiogram >> EF of 20 to 25% with global akinesis of left ventricle.  Cannot completely exclude mural thrombus at left ventricular apex.  Right ventricular systolic function is mildly reduced with normal size.The inferior vena cava is dilated in size with <50% respiratory  variability, suggesting right atrial pressure of 15 mmHg.  8/20 echocardiogram>> no apical thrombus, EF 25-30% CT head 8/25 >  10 cc acute hematoma centered at the left thalamus, usually hypertensive in this location. MRI/A Brain 8/26 > Similar size/appearance of the acute hemorrhage centered in the left thalamus. Surrounding edema and rightward midline shift. No acute vascular abnormality or underlying vascular lesion identified. Diffuse T1  hypointensity and heterogeneity of the marrow without discrete marrow replacing lesion.  Micro Data:  Blood culture 8/19 > negative  Antimicrobials:  Vancomycin 8/19 - 8/21 Zosyn 8/19 > 8/25  Interim history/subjective:  Patient did not tolerate SBT yesterday, became tachypneic hypotensive and agitated.  Today she is following commands on the left side, neglecting right side  Objective   Blood pressure 103/67, pulse 78, temperature 98 F (36.7 C), temperature source Axillary, resp. rate (!) 24, height 5\' 9"  (1.753 m), weight 83.6 kg, SpO2 97 %.    Vent Mode: PCV FiO2 (%):  [40 %-100 %] 40 % Set Rate:  [24 bmp] 24 bmp PEEP:  [5 cmH20] 5 cmH20 Plateau Pressure:  [20 cmH20-23 cmH20] 23 cmH20   Intake/Output Summary (Last 24 hours) at 11/03/2019 0943 Last data filed at 11/03/2019 0800 Gross per 24 hour  Intake 3110.66 ml  Output 1415 ml  Net 1695.66 ml   Filed Weights   11/01/19 0350 11/02/19 0425 11/03/19 0500  Weight: 85.7 kg 82.6 kg 83.6 kg     Physical exam:  General: Chronically ill female on vent via trach HEENT: 6 shiley cuffed trach in place. Sutures remain.  Neuro: alert, eyes open, Follows commands on the L. R facial droop, right-sided neglect and hemiplegia CV: RRR, no MRG PULM:  Coarse bilaterally, bilateral wheezing GI: Soft, non-tender, non-distended. G tube site CDI, Stoma pink. Raised erythema in the RLQ, at SQ injections sites.  Extremities: no acute deformity, no edema.  Skin: Grossly intact  Resolved Hospital Problem list   Acute kidney injury Hypophosphatemia Hypernatremia  Assessment & Plan:  Acute on chronic hypoxic and hypercarbic respiratory failure due to aspiration pneumonia  Hemorrhage from tracheostomy  site Severe COPD, not in exacerbation Redo tracheostomy completed 8/22 P: Continue ventilator support with lung protective strategies  Hold off on spontaneous breathing trial today, will give her trial tomorrow again  VAP bundle in place   PAD protocol  Left thalamic ICH: Patient was seen on AM assessment 8/24 by nurse to have new right side flaccidity with right facial droop and decreased alertness.  P: Stroke neurology input is appreciated Keep SBP < 160mmHg Holding DAPT until OK with neurology. Per cards when given the OK will go with Plavix 75mg  monotherapy.   Acute metabolic encephalopathy due to hypercapnia: improved P: Discontinue IV fentanyl infusion Continue as needed fentanyl IV for agitation and tachypnea Delirium precautions   Chronic systolic and diastolic congestive heart failure Coronary artery disease S/p STEMI with placement of DES 12/2018 on Brillinta P: Monitor I&O  Daily weight  Holding antiplatelets, continue statin   Hypokalemia P: Continue free water  Aggressively supplement potassium and monitor  Acute on chronic anemia due to blood loss from tracheostomy site -Readmitted to PCCM on 8/19 for acute on chronic respiratory failure 2/2 hemorrhaging  from her tracheostomy.  P: Hemoglobin goal greater than 7   Raised erythematous rash on RLQ of abdomen: from SQ injections vs evolving cellulitis. Just finished a course of Zosyn so less likely infection.  - Monitor daily.   Best practice:  Diet: NPO, tube feed Pain/Anxiety/Delirium protocol (if indicated): Fentanyl  VAP protocol (if indicated): yes DVT prophylaxis: None GI prophylaxis: PPI Glucose control: Sliding scale Mobility: BR Code Status: DNR Family Communication: Son updated via phone Disposition: ICU

## 2019-11-04 ENCOUNTER — Other Ambulatory Visit: Payer: Self-pay

## 2019-11-04 DIAGNOSIS — T17908D Unspecified foreign body in respiratory tract, part unspecified causing other injury, subsequent encounter: Secondary | ICD-10-CM

## 2019-11-04 LAB — BASIC METABOLIC PANEL
Anion gap: 12 (ref 5–15)
BUN: 51 mg/dL — ABNORMAL HIGH (ref 8–23)
CO2: 26 mmol/L (ref 22–32)
Calcium: 9.8 mg/dL (ref 8.9–10.3)
Chloride: 105 mmol/L (ref 98–111)
Creatinine, Ser: 0.96 mg/dL (ref 0.44–1.00)
GFR calc Af Amer: >60 mL/min (ref >60)
GFR calc non Af Amer: >60 mL/min (ref >60)
Glucose, Bld: 136 mg/dL — ABNORMAL HIGH (ref 70–99)
Potassium: 4.8 mmol/L (ref 3.5–5.1)
Sodium: 143 mmol/L (ref 135–145)

## 2019-11-04 LAB — GLUCOSE, CAPILLARY
Glucose-Capillary: 100 mg/dL — ABNORMAL HIGH (ref 70–99)
Glucose-Capillary: 111 mg/dL — ABNORMAL HIGH (ref 70–99)
Glucose-Capillary: 117 mg/dL — ABNORMAL HIGH (ref 70–99)
Glucose-Capillary: 118 mg/dL — ABNORMAL HIGH (ref 70–99)
Glucose-Capillary: 135 mg/dL — ABNORMAL HIGH (ref 70–99)
Glucose-Capillary: 138 mg/dL — ABNORMAL HIGH (ref 70–99)

## 2019-11-04 NOTE — Progress Notes (Signed)
eLink Physician-Brief Progress Note Patient Name: Marco Adelson DOB: 05-28-1955 MRN: [id number]   Date of Service  11/04/2019  HPI/Events of Note  Patient with new onset Afib/ flutter, EKG pending. Controlled ventricular response rate.  eICU Interventions  Will review EKG once available and proceed from  there.        Kerry Kass Roscoe Witts 11/04/2019, 11:19 PM

## 2019-11-04 NOTE — Progress Notes (Signed)
NAME:  Chelsea Allison, MRN:  [id number], DOB:  [birth year], LOS: 9 ADMISSION DATE:  10/26/2019, CONSULTATION DATE:  10/26/19 REFERRING MD:  Versie Starks, CHIEF COMPLAINT:  Respiratory failure   Brief History   64 y o F admitted at [facility] 6/29-7/28 for acute respiratory failure 2/2 HCAP/COPD exacerbation. She was unable to be weaned off the vent and was eventually trached on 7/6. She was discharged to [facility]. She was readmitted to [facility] on 8/19 for acute on chronic respiratory failure 2/2 hemorrhaging  from her tracheostomy.   Past Medical History  COPD, AoC respiratory failure, CAD, CHF, HTN  Significant Hospital Events   8/19 admitted to PCCM  Consults:  n/a  Procedures:  8/19 intubated, 8/20 self extubated, 8/20 reintubated due to respiratory distress. 8/19 bronchoscopy [facility] CVL 8/19 >  Significant Diagnostic Tests:  8/19 CXR>>enlarging right pleural effusion with increasing right basilar atelectic changes and subtotal collapse of RML and RLL 8/19 chest x-ray>>Veiling opacity in the right lung favored due to a combination of pleural effusion and atelectasis. Improving ventilation in the left lung since 10/10/2019 with stable residual left upper lung reticulonodular opacity. 8/19 echocardiogram >> EF of 20 to 25% with global akinesis of left ventricle.  Cannot completely exclude mural thrombus at left ventricular apex.  Right ventricular systolic function is mildly reduced with normal size.The inferior vena cava is dilated in size with <50% respiratory  variability, suggesting right atrial pressure of 15 mmHg.  8/20 echocardiogram>> no apical thrombus, EF 25-30% CT head 8/25 >  10 cc acute hematoma centered at the left thalamus, usually hypertensive in this location. MRI/A Brain 8/26 > Similar size/appearance of the acute hemorrhage centered in the left thalamus. Surrounding edema and rightward midline shift. No acute vascular abnormality or underlying vascular lesion identified. Diffuse T1  hypointensity and heterogeneity of the marrow without discrete marrow replacing lesion.  Micro Data:  Blood culture 8/19 > negative  Antimicrobials:  Vancomycin 8/19 - 8/21 Zosyn 8/19 > 8/25  Interim history/subjective:  Patient did not tolerate PST this am, became tachypneic.  Following commands on left.  Objective   Blood pressure 135/72, pulse 93, temperature (!) 97.3 F (36.3 C), temperature source Axillary, resp. rate (!) 32, height 5\' 9"  (1.753 m), weight 86.4 kg, SpO2 98 %.    Vent Mode: PRVC FiO2 (%):  [40 %] 40 % Set Rate:  [24 bmp] 24 bmp Vt Set:  [500 mL] 500 mL PEEP:  [5 cmH20] 5 cmH20 Plateau Pressure:  [20 cmH20-30 cmH20] 28 cmH20   Intake/Output Summary (Last 24 hours) at 11/04/2019 4403 Last data filed at 11/04/2019 0700 Gross per 24 hour  Intake 2775.26 ml  Output 1648 ml  Net 1127.26 ml   Filed Weights   11/02/19 0425 11/03/19 0500 11/04/19 0500  Weight: 82.6 kg 83.6 kg 86.4 kg     Physical exam:  General: Chronically ill female on vent via trach HEENT: 6 shiley cuffed trach in place. Sutures remain.  Neuro: alert, eyes open, Follows commands on the L. R facial droop, right-sided neglect and hemiplegia CV: RRR, no MRG PULM:  Coarse bilaterally, bilateral wheezing GI: Soft, non-tender, non-distended. G tube site CDI, Stoma pink. Raised erythema in the RLQ, at SQ injections sites.  Extremities: no acute deformity, no edema.  Skin: Grossly intact  Resolved Hospital Problem list   Acute kidney injury Hypophosphatemia Hypernatremia  Assessment & Plan:  Acute on chronic hypoxic and hypercarbic respiratory failure due to aspiration pneumonia  Hemorrhage from tracheostomy site (resolved) Severe  COPD, not in exacerbation Redo tracheostomy completed 8/22 P: Continue ventilator support with lung protective strategies  Daily SBTs VAP bundle in place   Left thalamic ICH: Patient was seen on AM assessment 8/24 by nurse to have new right side flaccidity  with right facial droop and decreased alertness.  P: Stroke neurology input is appreciated Keep SBP < 158mmHg, PRN labetalol Holding DAPT until OK with neurology plan to re image 9/7 and re-evaluate. Per cards when given the OK will go with Plavix 75mg  monotherapy.   Acute metabolic encephalopathy due to hypercapnia: improved P: Continue as needed fentanyl IV for agitation and tachypnea Delirium precautions   Chronic systolic and diastolic congestive heart failure Coronary artery disease S/p STEMI with placement of DES 12/2018 on Brillinta P: Monitor I&O  Daily weight  Holding antiplatelets, continue statin  Acute on chronic anemia due to blood loss from tracheostomy site -Readmitted to PCCM on 8/19 for acute on chronic respiratory failure 2/2 hemorrhaging  from her tracheostomy.  P: Hemoglobin goal greater than 7, stable   Best practice:  Diet: NPO, tube feed Pain/Anxiety/Delirium protocol (if indicated): Fentanyl  VAP protocol (if indicated): yes DVT prophylaxis: None GI prophylaxis: PPI Glucose control: Sliding scale Mobility: BR Code Status: DNR Family Communication: as able Disposition: ICU   CRITICAL CARE Performed by: Lanier Clam   Total critical care time: 30 minutes  Critical care time was exclusive of separately billable procedures and treating other patients.  Critical care was necessary to treat or prevent imminent or life-threatening deterioration.  Critical care was time spent personally by me on the following activities: development of treatment plan with patient and/or surrogate as well as nursing, discussions with consultants, evaluation of patient's response to treatment, examination of patient, obtaining history from patient or surrogate, ordering and performing treatments and interventions, ordering and review of laboratory studies, ordering and review of radiographic studies, pulse oximetry and re-evaluation of patient's condition.

## 2019-11-05 DIAGNOSIS — J9611 Chronic respiratory failure with hypoxia: Secondary | ICD-10-CM

## 2019-11-05 LAB — GLUCOSE, CAPILLARY
Glucose-Capillary: 121 mg/dL — ABNORMAL HIGH (ref 70–99)
Glucose-Capillary: 123 mg/dL — ABNORMAL HIGH (ref 70–99)
Glucose-Capillary: 108 mg/dL — ABNORMAL HIGH (ref 70–99)
Glucose-Capillary: 132 mg/dL — ABNORMAL HIGH (ref 70–99)
Glucose-Capillary: 129 mg/dL — ABNORMAL HIGH (ref 70–99)

## 2019-11-05 NOTE — Progress Notes (Signed)
**Note De-Identified Chelsea Obfuscation** eLink Physician-Brief Progress Note Patient Name: Chelsea Allison DOB: [birth year] MRN: [id number]   Date of Service  11/05/2019  HPI/Events of Note  Patient in atrial flutter with variable block, ventricular response rate is controlled and blood pressure is stable at 113/78 with a MAP of 89. Patient would normally be a candidate for anticoagulation due to the rombotic risk but she is s/p intracranial hemorrhage 10/29/19 and also had to have her tracheostomy revised recently due to intractable hemorrhage.  eICU Interventions  No anticoagulation for now, will observe patient for spontaneous conversion to sinus rhythm overnight, and if she does not, consult cardiology in a.m.        Kerry Kass [name] 11/05/2019, 12:40 AM

## 2019-11-05 NOTE — Progress Notes (Signed)
NAME:  Chelsea Allison, MRN:  [id number], DOB:  02-21-1956, LOS: 59 ADMISSION DATE:  10/26/2019, CONSULTATION DATE:  10/26/19 REFERRING MD:  Versie Starks, CHIEF COMPLAINT:  Respiratory failure   Brief History   64 y o F admitted at [facility] 6/29-7/28 for acute respiratory failure 2/2 HCAP/COPD exacerbation. She was unable to be weaned off the vent and was eventually trached on 7/6. She was discharged to [facility]. She was readmitted to [facility] on 8/19 for acute on chronic respiratory failure 2/2 hemorrhaging  from her tracheostomy.   Past Medical History  COPD, AoC respiratory failure, CAD, CHF, HTN  Significant Hospital Events   8/19 admitted to PCCM  Consults:  n/a  Procedures:  8/19 intubated, 8/20 self extubated, 8/20 reintubated due to respiratory distress. 8/19 bronchoscopy [facility] CVL 8/19 >  Significant Diagnostic Tests:  8/19 CXR>>enlarging right pleural effusion with increasing right basilar atelectic changes and subtotal collapse of RML and RLL 8/19 chest x-ray>>Veiling opacity in the right lung favored due to a combination of pleural effusion and atelectasis. Improving ventilation in the left lung since 10/10/2019 with stable residual left upper lung reticulonodular opacity. 8/19 echocardiogram >> EF of 20 to 25% with global akinesis of left ventricle.  Cannot completely exclude mural thrombus at left ventricular apex.  Right ventricular systolic function is mildly reduced with normal size.The inferior vena cava is dilated in size with <50% respiratory  variability, suggesting right atrial pressure of 15 mmHg.  8/20 echocardiogram>> no apical thrombus, EF 25-30% CT head 8/25 >  10 cc acute hematoma centered at the left thalamus, usually hypertensive in this location. MRI/A Brain 8/26 > Similar size/appearance of the acute hemorrhage centered in the left thalamus. Surrounding edema and rightward midline shift. No acute vascular abnormality or underlying vascular lesion identified. Diffuse T1  hypointensity and heterogeneity of the marrow without discrete marrow replacing lesion.  Micro Data:  Blood culture 8/19 > negative  Antimicrobials:  Vancomycin 8/19 - 8/21 Zosyn 8/19 > 8/25  Interim history/subjective:  Aflutter overnight. Spontaneously converted to NSR early AM. Awake, alert, does not seem to desire interaction. Seems profoundly depressed.  Objective   Blood pressure 118/79, pulse 76, temperature 98.4 F (36.9 C), temperature source Axillary, resp. rate (!) 28, height 5\' 9"  (1.753 m), weight 84.9 kg, SpO2 100 %.    Vent Mode: PRVC FiO2 (%):  [40 %] 40 % Set Rate:  [24 bmp] 24 bmp Vt Set:  [500 mL] 500 mL PEEP:  [5 cmH20] 5 cmH20 Plateau Pressure:  [26 cmH20-29 cmH20] 26 cmH20   Intake/Output Summary (Last 24 hours) at 11/05/2019 7322 Last data filed at 11/05/2019 0600 Gross per 24 hour  Intake 2625 ml  Output 1480 ml  Net 1145 ml   Filed Weights   11/03/19 0500 11/04/19 0500 11/05/19 0500  Weight: 83.6 kg 86.4 kg 84.9 kg     Physical exam:  General: Chronically ill female on vent via trach HEENT: 6 shiley cuffed trach in place. Sutures remain. No blood. Neuro: alert, eyes open, Follows commands on the L. R facial droop, right-sided neglect and hemiplegia CV: RRR, no MRG PULM:  Coarse bilaterally GI: Soft, non-tender, non-distended. G tube site CDI, Stoma pink. Raised erythema in the RLQ, at SQ injections sites.  Extremities: no acute deformity, no edema.  Skin: Grossly intact  Resolved Hospital Problem list   Acute kidney injury Hypophosphatemia Hypernatremia  Assessment & Plan:  Acute on chronic hypoxic and hypercarbic respiratory failure due to aspiration pneumonia  Hemorrhage from tracheostomy  site (resolved) Severe COPD, not in exacerbation Redo tracheostomy completed 8/22 P: Continue ventilator support with lung protective strategies  Daily SBTs VAP bundle in place   Left thalamic ICH: Patient was seen on AM assessment 8/24 by nurse to  have new right side flaccidity with right facial droop and decreased alertness.  P: Stroke neurology input is appreciated Keep SBP < 181mmHg, PRN labetalol Holding DAPT until OK with neurology plan to re image 9/7 and re-evaluate. Per cards when given the OK will go with Plavix 75mg  monotherapy.   Acute metabolic encephalopathy due to hypercapnia: improved P: Continue as needed fentanyl IV for agitation and tachypnea Delirium precautions   Chronic systolic and diastolic congestive heart failure Coronary artery disease S/p STEMI with placement of DES 12/2018 on Brillinta P: Monitor I&O  Daily weight  Holding antiplatelets, continue statin  Acute on chronic anemia due to blood loss from tracheostomy site -Readmitted to PCCM on 8/19 for acute on chronic respiratory failure 2/2 hemorrhaging  from her tracheostomy.  P: Hemoglobin goal greater than 7, stable   Best practice:  Diet: NPO, tube feed Pain/Anxiety/Delirium protocol (if indicated): Fentanyl  VAP protocol (if indicated): yes DVT prophylaxis: SCDs, ICH GI prophylaxis: PPI Glucose control: Sliding scale Mobility: BR Code Status: DNR Family Communication: as able Disposition: ICU   CRITICAL CARE Performed by: Lanier Clam   Total critical care time: 30 minutes  Critical care time was exclusive of separately billable procedures and treating other patients.  Critical care was necessary to treat or prevent imminent or life-threatening deterioration.  Critical care was time spent personally by me on the following activities: development of treatment plan with patient and/or surrogate as well as nursing, discussions with consultants, evaluation of patient's response to treatment, examination of patient, obtaining history from patient or surrogate, ordering and performing treatments and interventions, ordering and review of laboratory studies, ordering and review of radiographic studies, pulse oximetry and  re-evaluation of patient's condition.

## 2019-11-06 LAB — GLUCOSE, CAPILLARY
Glucose-Capillary: 107 mg/dL — ABNORMAL HIGH (ref 70–99)
Glucose-Capillary: 113 mg/dL — ABNORMAL HIGH (ref 70–99)
Glucose-Capillary: 117 mg/dL — ABNORMAL HIGH (ref 70–99)
Glucose-Capillary: 117 mg/dL — ABNORMAL HIGH (ref 70–99)
Glucose-Capillary: 121 mg/dL — ABNORMAL HIGH (ref 70–99)
Glucose-Capillary: 95 mg/dL (ref 70–99)

## 2019-11-06 LAB — CBC
HCT: 30 % — ABNORMAL LOW (ref 36.0–46.0)
Hemoglobin: 8.4 g/dL — ABNORMAL LOW (ref 12.0–15.0)
MCH: 26.5 pg (ref 26.0–34.0)
MCHC: 28 g/dL — ABNORMAL LOW (ref 30.0–36.0)
MCV: 94.6 fL (ref 80.0–100.0)
Platelets: 428 10*3/uL — ABNORMAL HIGH (ref 150–400)
RBC: 3.17 MIL/uL — ABNORMAL LOW (ref 3.87–5.11)
RDW: 18 % — ABNORMAL HIGH (ref 11.5–15.5)
WBC: 11.9 10*3/uL — ABNORMAL HIGH (ref 4.0–10.5)
nRBC: 0 % (ref 0.0–0.2)

## 2019-11-06 LAB — BASIC METABOLIC PANEL
Anion gap: 11 (ref 5–15)
BUN: 39 mg/dL — ABNORMAL HIGH (ref 8–23)
CO2: 27 mmol/L (ref 22–32)
Calcium: 9.6 mg/dL (ref 8.9–10.3)
Chloride: 103 mmol/L (ref 98–111)
Creatinine, Ser: 0.74 mg/dL (ref 0.44–1.00)
GFR calc Af Amer: >60 mL/min (ref >60)
GFR calc non Af Amer: >60 mL/min (ref >60)
Glucose, Bld: 122 mg/dL — ABNORMAL HIGH (ref 70–99)
Potassium: 3.6 mmol/L (ref 3.5–5.1)
Sodium: 141 mmol/L (ref 135–145)

## 2019-11-06 NOTE — TOC Progression Note (Signed)
Transition of Care [facility]) - Progression Note    Patient Details  Name: Rondalyn Belford MRN: [id number] Date of Birth: [birth year]  Transition of Care [facility]) CM/SW [place], RN Phone Number: 11/06/2019, 5:32 PM  Clinical Narrative:     Called back from [facility] wants more progress notes, medications,  PT notes, faxes all over to [facility] [phone]  Expected Discharge Plan: Long Term Acute Care (LTAC) Barriers to Discharge: Continued Medical Work up  Expected Discharge Plan and Services Expected Discharge Plan: [place] (LTAC)                                               Social Determinants of Health (SDOH) Interventions    Readmission Risk Interventions Readmission Risk Prevention Plan 01/11/2019 12/20/2018 12/20/2018  Transportation Screening Complete - Complete  PCP or Specialist Appt within 5-7 Days - Complete -  PCP or Specialist Appt within 3-5 Days (No Data) - -  Home Care Screening - - Complete  Medication Review (RN CM) - - Complete  HRI or Home Care Consult Complete - -  Social Work Consult for [place] Planning/Counseling Complete - -  Palliative Care Screening Not Applicable - -  Medication Review (RN Care Manager) Complete - -  Some recent data might be hidden

## 2019-11-06 NOTE — Progress Notes (Signed)
NAME:  Chelsea Allison, MRN:  [id number], DOB:  17-Apr-1955, LOS: 58 ADMISSION DATE:  10/26/2019, CONSULTATION DATE:  10/26/19 REFERRING MD:  Versie Starks, CHIEF COMPLAINT:  Respiratory failure   Brief History   64 y o F admitted at [facility] 6/29-7/28 for acute respiratory failure 2/2 HCAP/COPD exacerbation. She was unable to be weaned off the vent and was eventually trached on 7/6. [facility]. She was readmitted to [facility] on 8/19 for acute on chronic respiratory failure 2/2 hemorrhaging  from her tracheostomy.   Past Medical History  COPD, AoC respiratory failure, CAD, CHF, HTN  Significant Hospital Events   8/19 admitted to PCCM  Consults:  n/a  Procedures:  8/19 intubated, 8/20 self extubated, 8/20 reintubated due to respiratory distress. 8/19 bronchoscopy [facility] CVL 8/19 >  Significant Diagnostic Tests:  8/19 CXR>>enlarging right pleural effusion with increasing right basilar atelectic changes and subtotal collapse of RML and RLL 8/19 chest x-ray>>Veiling opacity in the right lung favored due to a combination of pleural effusion and atelectasis. Improving ventilation in the left lung since 10/10/2019 with stable residual left upper lung reticulonodular opacity. 8/19 echocardiogram >> EF of 20 to 25% with global akinesis of left ventricle.  Cannot completely exclude mural thrombus at left ventricular apex.  Right ventricular systolic function is mildly reduced with normal size.The inferior vena cava is dilated in size with <50% respiratory  variability, suggesting right atrial pressure of 15 mmHg.  8/20 echocardiogram>> no apical thrombus, EF 25-30% CT head 8/25 >  10 cc acute hematoma centered at the left thalamus, usually hypertensive in this location. MRI/A Brain 8/26 > Similar size/appearance of the acute hemorrhage centered in the left thalamus. Surrounding edema and rightward midline shift. No acute vascular abnormality or underlying vascular lesion identified. Diffuse T1  hypointensity and heterogeneity of the marrow without discrete marrow replacing lesion.  Micro Data:  Blood culture 8/19 > negative  Antimicrobials:  Vancomycin 8/19 - 8/21 Zosyn 8/19 > 8/25  Interim history/subjective:  NAEON. Trialing PSV.   Objective   Blood pressure 112/60, pulse 71, temperature 98.4 F (36.9 C), temperature source Oral, resp. rate (!) 26, height 5\' 9"  (1.753 m), weight 85.2 kg, SpO2 100 %.    Vent Mode: PRVC FiO2 (%):  [40 %] 40 % Set Rate:  [24 bmp] 24 bmp Vt Set:  [500 mL] 500 mL PEEP:  [5 cmH20] 5 cmH20 Plateau Pressure:  [28 cmH20-30 cmH20] 29 cmH20   Intake/Output Summary (Last 24 hours) at 11/06/2019 1557 Last data filed at 11/06/2019 1400 Gross per 24 hour  Intake 2970 ml  Output 965 ml  Net 2005 ml   Filed Weights   11/04/19 0500 11/05/19 0500 11/06/19 0500  Weight: 86.4 kg 84.9 kg 85.2 kg     Physical exam:  General: Chronically ill female on vent via trach HEENT: 6 shiley cuffed trach in place. Sutures remain. No blood. Neuro: alert, eyes open, Follows commands on the L. R facial droop, right-sided neglect and hemiplegia CV: RRR, no MRG PULM:  Coarse bilaterally GI: Soft, non-tender, non-distended. G tube site CDI, Stoma pink. Raised erythema in the RLQ, at SQ injections sites.  Extremities: no acute deformity, no edema.  Skin: Grossly intact  Resolved Hospital Problem list   Acute kidney injury Hypophosphatemia Hypernatremia  Assessment & Plan:  Acute on chronic hypoxic and hypercarbic respiratory failure due to aspiration pneumonia  Hemorrhage from tracheostomy site (resolved) Severe COPD, not in exacerbation Redo tracheostomy completed 8/22 P: Continue ventilator support  with lung protective strategies  Daily PSV trials VAP bundle in place   Left thalamic ICH: Patient was seen on AM assessment 8/24 by nurse to have new right side flaccidity with right facial droop and decreased alertness.  P: Stroke neurology input is  appreciated Keep SBP < 191mmHg, PRN labetalol Holding DAPT until OK with neurology plan to re image 9/7 and re-evaluate. Per cards when given the OK will go with Plavix 75mg  monotherapy.   Acute metabolic encephalopathy due to hypercapnia: improved P: Continue as needed fentanyl IV for agitation and tachypnea Delirium precautions   Chronic systolic and diastolic congestive heart failure Coronary artery disease S/p STEMI with placement of DES 12/2018 on Brillinta P: Monitor I&O  Daily weight  Holding antiplatelets, continue statin  Acute on chronic anemia due to blood loss from tracheostomy site -Readmitted to PCCM on 8/19 for acute on chronic respiratory failure 2/2 hemorrhaging  from her tracheostomy.  P: Hemoglobin goal greater than 7, stable   Best practice:  Diet: NPO, tube feed Pain/Anxiety/Delirium protocol (if indicated): Fentanyl  VAP protocol (if indicated): yes DVT prophylaxis: SCDs, ICH GI prophylaxis: PPI Glucose control: Sliding scale Mobility: BR Code Status: DNR Family Communication: as able Disposition: ICU, ready for LTACH   CRITICAL CARE Performed by: Lanier Clam   Total critical care time: 32 minutes  Critical care time was exclusive of separately billable procedures and treating other patients.  Critical care was necessary to treat or prevent imminent or life-threatening deterioration.  Critical care was time spent personally by me on the following activities: development of treatment plan with patient and/or surrogate as well as nursing, discussions with consultants, evaluation of patient's response to treatment, examination of patient, obtaining history from patient or surrogate, ordering and performing treatments and interventions, ordering and review of laboratory studies, ordering and review of radiographic studies, pulse oximetry and re-evaluation of patient's condition.

## 2019-11-06 NOTE — Discharge Summary (Deleted)
Physician Discharge Summary   Patient ID: Siren Porrata [id number] 64 y.o. 1955/10/28  Admit date: 10/26/2019  Discharge date and time: No discharge date for patient encounter.   Admitting Physician: Chi Rodman Pickle, MD   Discharge Physician: Lanier Clam, MD  Admission Diagnoses: Respiratory distress [R06.03] Aspiration of foreign body in respiratory tract, initial encounter [T17.908A]  Discharge Diagnoses: Active Problems:   Aspiration of foreign body in respiratory tract   Hemoptysis   [facility])   Intracerebral hemorrhage   Admission Condition: critical  Discharged Condition: serious  Indication for Admission: respiratory failure  Hospital Course:   Came to ICU after adult code blue at [facility]. Bleeding from trach. Difficult to oxygenate through trach. Intubated from above. Large clots removed via bronch. Re-do Trach 8/22 without significant signs of bleeding from stoma or tract. She was supported with full vent support. Unfortunately, developed gaze preference and new right sided facial droop, right hemiplegia. CT showed left cerebral hemorrhage. Antiplatelets were discontinued. Blood pressure was controlled without need for drips or PRN IV antihypertensives. She demonstrated stable exam with right face droop and right hemiplegia. Neurology recommends repeat CT head 9/7 and if stable to resume plavix 75 mg daily. Cardiology agrees plavix monotherapy would be appropriate. In terms of respiratory failure, she has been trialing on pressure support each day. Please see below for a detailed hospital course by problem.  Acute on chronic hypoxic and hypercarbic respiratory failure due to aspiration pneumonia  Hemorrhage from tracheostomy site (resolved) Severe COPD, not in exacerbation Redo tracheostomy completed 8/22 P: Continue ventilator support with lung protective strategies  Daily PSV trials VAP bundle in place  Continue brovana and pulmicort nebs Completed course  of antibiotics for Pna  Left thalamic ICH: Patient was seen on AM assessment 8/24 by nurse to have new right side flaccidity with right facial droop and decreased alertness.  P: Stroke neurology input is appreciated Keep SBP < 151mmHg, not requiring medications Holding DAPT until OK with neurology plan to re image 9/7 and re-evaluate. Per cards when given the OK will go with Plavix 75mg  monotherapy.   Acute metabolic encephalopathy due to hypercapnia: Resolved P: Continue as needed fentanyl IV for agitation and tachypnea Delirium precautions   Chronic systolic and diastolic congestive heart failure Coronary artery disease S/p STEMI with placement of DES 12/2018 on Brillinta P: Monitor I&O  Daily weight  Holding antiplatelets, continue statin, plan to resume plavix if CT stable 9/7  Acute on chronic anemia due to blood loss from tracheostomy site Readmitted to PCCM on 8/19 for acute on chronic respiratory failure 2/2 hemorrhaging  from her tracheostomy.  P: Hemoglobin goal greater than 7, stable  Consults: cardiology and neurology  Significant Diagnostic Studies: radiology: CT scan: CT head L thalamic bleed    Disposition:  There are no questions and answers to display.        Patient Instructions:  Allergies as of 11/06/2019      Reactions   Statins Other (See Comments)   Hydrochlorothiazide Other (See Comments)   headaches   Hydrocodone Nausea And Vomiting   Lisinopril Other (See Comments)   Headache   Oxycodone Nausea Only      Medication List    STOP taking these medications   Breo Ellipta 100-25 MCG/INH Aepb Generic drug: fluticasone furoate-vilanterol   ticagrelor 90 MG Tabs tablet Commonly known as: BRILINTA   umeclidinium bromide 62.5 MCG/INH Aepb Commonly known as: INCRUSE ELLIPTA     TAKE these medications  acetaminophen 325 MG tablet Commonly known as: TYLENOL Place 2 tablets (650 mg total) into feeding tube every 6 (six) hours as  needed for mild pain, fever or headache.   albuterol (2.5 MG/3ML) 0.083% nebulizer solution Commonly known as: PROVENTIL Take 3 mLs (2.5 mg total) by nebulization every 2 (two) hours as needed for wheezing or shortness of breath.   ALPRAZolam 0.25 MG tablet Commonly known as: XANAX Take 0.25 mg by mouth at bedtime as needed for anxiety.   arformoterol 15 MCG/2ML Nebu Commonly known as: BROVANA Take 2 mLs (15 mcg total) by nebulization 2 (two) times daily.   budesonide 0.25 MG/2ML nebulizer solution Commonly known as: PULMICORT Take 2 mLs (0.25 mg total) by nebulization 2 (two) times daily.   buprenorphine 20 MCG/HR Ptwk Commonly known as: BUTRANS Place 1 patch onto the skin once a week.   clonazePAM 0.5 MG tablet Commonly known as: KLONOPIN Place 1 tablet (0.5 mg total) into feeding tube 2 (two) times daily.   cyanocobalamin 1000 MCG tablet Place 1 tablet (1,000 mcg total) into feeding tube every other day.   docusate 50 MG/5ML liquid Commonly known as: COLACE Place 10 mLs (100 mg total) into feeding tube 2 (two) times daily.   enoxaparin 40 MG/0.4ML injection Commonly known as: LOVENOX Inject 0.4 mLs (40 mg total) into the skin daily.   feeding supplement (VITAL 1.5 CAL) Liqd Place 1,000 mLs into feeding tube continuous. 60 ml/hour   free water Soln Place 100 mLs into feeding tube every 8 (eight) hours.   furosemide 20 MG tablet Commonly known as: LASIX Take 20 mg by mouth.   guaiFENesin 100 MG/5ML Soln Commonly known as: ROBITUSSIN Place 15 mLs (300 mg total) into feeding tube every 6 (six) hours.   insulin aspart 100 UNIT/ML injection Commonly known as: novoLOG Inject 0-15 Units into the skin every 4 (four) hours.   losartan 25 MG tablet Commonly known as: COZAAR Take 25 mg by mouth daily.   Oxycodone HCl 10 MG Tabs Place 0.5 tablets (5 mg total) into feeding tube every 6 (six) hours as needed for severe pain.   pantoprazole sodium 40 mg/20 mL  Pack Commonly known as: PROTONIX Place 20 mLs (40 mg total) into feeding tube daily.   polyethylene glycol 17 g packet Commonly known as: MIRALAX / GLYCOLAX Place 17 g into feeding tube daily.   QUEtiapine 25 MG tablet Commonly known as: SEROQUEL Place 1 tablet (25 mg total) into feeding tube 2 (two) times daily.   revefenacin 175 MCG/3ML nebulizer solution Commonly known as: YUPELRI Take 3 mLs (175 mcg total) by nebulization daily.   rosuvastatin 20 MG tablet Commonly known as: CRESTOR Place 1 tablet (20 mg total) into feeding tube daily.   Vitamin D (Ergocalciferol) 1.25 MG (50000 UNIT) Caps capsule Commonly known as: DRISDOL Place 1 capsule (50,000 Units total) into feeding tube every 7 (seven) days.      Activity: activity as tolerated Diet: Tube feeds    SignedBonna Gains [facility] 11/06/2019 6:23 PM

## 2019-11-06 NOTE — Progress Notes (Signed)
Physical Therapy Treatment Patient Details Name: Chelsea Allison MRN: [id number] DOB: 10-May-1955 Today's Date: 11/06/2019    History of Present Illness 64 year old female who recently had prolonged hospitalization at [facility] for acute hypoxemic respiratory failure with difficulty weaning from the vent s/p trach 7/6. She was discharged to [facility] for further respiratory management and weaning. On 8/19 patient had Code Blue called and resident team and anesthesia arrived. Patient had bleeding airway and respiratory arrest in the 60s. Left chest needle decompression performed for possible pneumothorax. PCCM was called to the bedside by anesthesia for assistance of bleeding airway..  Decision made to transfer pt directly to [facility] medical ICU.   [place]: STEMI, neuropathy, ICM, HT, COPD, HF, lung CA    PT Comments    Pt with eyes closed throughout session, unwilling to acknowledge therapist or actively participate. Pt resisting ROM attempts RUE. Resistance also noted at R hip. PROM able to be performed R knee/ankle with no active movement noted. Pt remains on vent via trach.    Follow Up Recommendations  LTACH     Equipment Recommendations  None recommended by PT    Recommendations for Other Services       Precautions / Restrictions Precautions Precautions: Fall;Other (comment) Precaution Comments: trach/vent, PEG, sacral wounds Restrictions Weight Bearing Restrictions: No    Mobility  Bed Mobility Overal bed mobility: Needs Assistance Bed Mobility: Rolling Rolling: Total assist         General bed mobility comments: Pt resisting/unwilling.  Transfers                    Ambulation/Gait                 Stairs             Wheelchair Mobility    Modified Rankin (Stroke Patients Only)       Balance                                            Cognition Arousal/Alertness: Lethargic Behavior During Therapy: Flat affect Overall Cognitive  Status: Difficult to assess                                 General Comments: eyes closed, unengaged      Exercises Other Exercises Other Exercises: Attempted ROM RUE/LE. Pt resisting movement RUE and RLE (at hip). No resistance or active movement noted R knee/ankle.    General Comments        Pertinent Vitals/Pain Pain Assessment: Faces Faces Pain Scale: Hurts little more Pain Location: genralized with mobility Pain Descriptors / Indicators: Grimacing Pain Intervention(s): Monitored during session    Home Living                      Prior Function            PT Goals (current goals can now be found in the care plan section) Acute Rehab PT Goals Patient Stated Goal: not stated Progress towards PT goals: Not progressing toward goals - comment (not participating)    Frequency    Min 2X/week      PT Plan Current plan remains appropriate    Co-evaluation              AM-PAC PT "6 Clicks"  Mobility   Outcome Measure  Help needed turning from your back to your side while in a flat bed without using bedrails?: Total Help needed moving from lying on your back to sitting on the side of a flat bed without using bedrails?: Total Help needed moving to and from a bed to a chair (including a wheelchair)?: Total Help needed standing up from a chair using your arms (e.g., wheelchair or bedside chair)?: Total Help needed to walk in hospital room?: Total Help needed climbing 3-5 steps with a railing? : Total 6 Click Score: 6    End of Session Equipment Utilized During Treatment: Oxygen Activity Tolerance: Treatment limited secondary to agitation;Patient limited by lethargy Patient left: in bed;with call bell/phone within reach;with restraints reapplied Nurse Communication: Mobility status PT Visit Diagnosis: Other abnormalities of gait and mobility (R26.89);Other symptoms and signs involving the nervous system (R29.898);Muscle weakness  (generalized) (M62.81)     Time: 1914-7829 PT Time Calculation (min) (ACUTE ONLY): 10 min  Charges:  $Therapeutic Exercise: 8-22 mins                     Lorrin Goodell, PT  Office # [phone] Pager [phone]    Lorriane Shire 11/06/2019, 9:42 AM

## 2019-11-06 NOTE — Progress Notes (Signed)
SLP Cancellation Note  Patient Details Name: Chelsea Allison MRN: [id number] DOB: 1955-11-01   Cancelled treatment:        Checked chart earlier. On vent and PT notes state she keep eyes closed without active participation during PT. For inline PMV pt needs to be interactive to provide feedback re: respiratory status   [place] Siren 11/06/2019, 4:30 PM

## 2019-11-06 NOTE — TOC Progression Note (Addendum)
Transition of Care [facility]) - Progression Note    Patient Details  Name: Chelsea Allison MRN: [id number] Date of Birth: 09/10/1955  Transition of Care [facility]) CM/SW [place], RN Phone Number: [phone] 11/06/2019, 3:25 PM  Clinical Narrative:      [facility], they stated to call [facility] for [place].  [phone]  ICD codes for respiratory failure,  Heart failure, Stroke ( ICH) and Plural effusion were entered. Authorization Begun for [facility] Reference number [id number] ,.  [place] did say that  [facility] was not in network and that [facility] was the only [facility] in network for [employer] sure if patient is aware of this.  Left a message for son to call me.   Expected Discharge Plan: Long Term Acute Care (LTAC) Barriers to Discharge: Continued Medical Work up  Expected Discharge Plan and Services Expected Discharge Plan: [place] (LTAC)                                               Social Determinants of Health (SDOH) Interventions    Readmission Risk Interventions Readmission Risk Prevention Plan 01/11/2019 12/20/2018 12/20/2018  Transportation Screening Complete - Complete  PCP or Specialist Appt within 5-7 Days - Complete -  PCP or Specialist Appt within 3-5 Days (No Data) - -  Home Care Screening - - Complete  Medication Review (RN CM) - - Complete  HRI or Home Care Consult Complete - -  Social Work Consult for [place] Planning/Counseling Complete - -  Palliative Care Screening Not Applicable - -  Medication Review (RN Care Manager) Complete - -  Some recent data might be hidden

## 2019-11-06 NOTE — TOC Progression Note (Signed)
Transition of Care [facility]) - Progression Note    Patient Details  Name: Chelsea Allison MRN: [id number] Date of Birth: 1955/09/03  Transition of Care [facility]) CM/SW [place], RN Phone Number: 11/06/2019, 12:11 PM  Clinical Narrative:     Is ready to go to LTAC today . Called [facility] to see if can accept they will call back shortly.   Expected Discharge Plan: Long Term Acute Care (LTAC) Barriers to Discharge: Continued Medical Work up  Expected Discharge Plan and Services Expected Discharge Plan: [place] (LTAC)                                               Social Determinants of Health (SDOH) Interventions    Readmission Risk Interventions Readmission Risk Prevention Plan 01/11/2019 12/20/2018 12/20/2018  Transportation Screening Complete - Complete  PCP or Specialist Appt within 5-7 Days - Complete -  PCP or Specialist Appt within 3-5 Days (No Data) - -  Home Care Screening - - Complete  Medication Review (RN CM) - - Complete  HRI or Home Care Consult Complete - -  Social Work Consult for [place] Planning/Counseling Complete - -  Palliative Care Screening Not Applicable - -  Medication Review (RN Care Manager) Complete - -  Some recent data might be hidden

## 2019-11-07 DIAGNOSIS — R579 Shock, unspecified: Secondary | ICD-10-CM

## 2019-11-07 LAB — GLUCOSE, CAPILLARY
Glucose-Capillary: 108 mg/dL — ABNORMAL HIGH (ref 70–99)
Glucose-Capillary: 109 mg/dL — ABNORMAL HIGH (ref 70–99)
Glucose-Capillary: 110 mg/dL — ABNORMAL HIGH (ref 70–99)
Glucose-Capillary: 117 mg/dL — ABNORMAL HIGH (ref 70–99)
Glucose-Capillary: 127 mg/dL — ABNORMAL HIGH (ref 70–99)
Glucose-Capillary: 95 mg/dL (ref 70–99)
Glucose-Capillary: 95 mg/dL (ref 70–99)

## 2019-11-07 NOTE — TOC Progression Note (Addendum)
Transition of Care [facility]) - Progression Note    Patient Details  Name: Chelsea Allison MRN: [id number] Date of Birth: [birth year]  Transition of Care [facility]) CM/SW Contact  Carles Collet, RN Phone Number: 11/07/2019, 9:13 AM  Clinical Narrative:    09:15 Verified with MD that patient has potential to wean from the vent. As such patient will be able to return to [facility] pending insurance authorization and available bed.  Spoke w [place] from [employer]	[place]. She will restart auth, as per standard procedure of [facility] obtaining their own auth for patients.  Attempt to reach son, no answer.  16:15 Updated son Gerald Stabs with plan to move to [facility] pending auth and bed availability.     Expected Discharge Plan: Long Term Acute Care (LTAC) Barriers to Discharge: Continued Medical Work up  Expected Discharge Plan and Services Expected Discharge Plan: [place] (LTAC)                                               Social Determinants of Health (SDOH) Interventions    Readmission Risk Interventions Readmission Risk Prevention Plan 01/11/2019 12/20/2018 12/20/2018  Transportation Screening Complete - Complete  PCP or Specialist Appt within 5-7 Days - Complete -  PCP or Specialist Appt within 3-5 Days (No Data) - -  Home Care Screening - - Complete  Medication Review (RN CM) - - Complete  HRI or Home Care Consult Complete - -  Social Work Consult for [place] Planning/Counseling Complete - -  Palliative Care Screening Not Applicable - -  Medication Review (RN Care Manager) Complete - -  Some recent data might be hidden

## 2019-11-07 NOTE — Progress Notes (Signed)
NAME:  Chelsea Allison, MRN:  [id number], DOB:  04-Aug-1955, LOS: 12 ADMISSION DATE:  10/26/2019, CONSULTATION DATE:  10/26/19 REFERRING MD:  Versie Starks, CHIEF COMPLAINT:  Respiratory failure   Brief History   64 y o F admitted at [facility] 6/29-7/28 for acute respiratory failure 2/2 HCAP/COPD exacerbation. She was unable to be weaned off the vent and was eventually trached on 7/6. She was discharged to [facility]. She was readmitted to [facility] on 8/19 for acute on chronic respiratory failure 2/2 hemorrhaging  from her tracheostomy.   Past Medical History  COPD, AoC respiratory failure, CAD, CHF, HTN  Significant Hospital Events   8/19 admitted to PCCM  Consults:  n/a  Procedures:  8/19 intubated, 8/20 self extubated, 8/20 reintubated due to respiratory distress. 8/19 bronchoscopy [facility] CVL 8/19 >  Significant Diagnostic Tests:  8/19 CXR>>enlarging right pleural effusion with increasing right basilar atelectic changes and subtotal collapse of RML and RLL 8/19 chest x-ray>>Veiling opacity in the right lung favored due to a combination of pleural effusion and atelectasis. Improving ventilation in the left lung since 10/10/2019 with stable residual left upper lung reticulonodular opacity. 8/19 echocardiogram >> EF of 20 to 25% with global akinesis of left ventricle.  Cannot completely exclude mural thrombus at left ventricular apex.  Right ventricular systolic function is mildly reduced with normal size.The inferior vena cava is dilated in size with <50% respiratory  variability, suggesting right atrial pressure of 15 mmHg.  8/20 echocardiogram>> no apical thrombus, EF 25-30% CT head 8/25 >  10 cc acute hematoma centered at the left thalamus, usually hypertensive in this location. MRI/A Brain 8/26 > Similar size/appearance of the acute hemorrhage centered in the left thalamus. Surrounding edema and rightward midline shift. No acute vascular abnormality or underlying vascular lesion identified. Diffuse T1  hypointensity and heterogeneity of the marrow without discrete marrow replacing lesion.  Micro Data:  Blood culture 8/19 > negative  Antimicrobials:  Vancomycin 8/19 - 8/21 Zosyn 8/19 > 8/25  Interim history/subjective:  NAEON. Doing PS trials, attempting to wean.   Objective   Blood pressure 106/69, pulse 74, temperature 98.6 F (37 C), temperature source Oral, resp. rate (!) 28, height 5\' 9"  (1.753 m), weight 85.5 kg, SpO2 100 %.    Vent Mode: PRVC FiO2 (%):  [40 %] 40 % Set Rate:  [24 bmp] 24 bmp Vt Set:  [500 mL] 500 mL PEEP:  [5 cmH20] 5 cmH20 Plateau Pressure:  [27 cmH20-31 cmH20] 30 cmH20   Intake/Output Summary (Last 24 hours) at 11/07/2019 1538 Last data filed at 11/07/2019 1200 Gross per 24 hour  Intake 2240 ml  Output 1350 ml  Net 890 ml   Filed Weights   11/05/19 0500 11/06/19 0500 11/07/19 0412  Weight: 84.9 kg 85.2 kg 85.5 kg     Physical exam:  General: Chronically ill female on vent via trach HEENT: 6 shiley cuffed trach in place. Sutures remain. No blood. Neuro: alert, eyes open, Follows commands on the L. R facial droop, right-sided neglect and hemiplegia CV: RRR, no MRG PULM:  Coarse bilaterally GI: Soft, non-tender, non-distended. G tube site CDI, Stoma pink. Raised erythema in the RLQ, at SQ injections sites.  Extremities: no acute deformity, no edema.  Skin: Grossly intact  Resolved Hospital Problem list   Acute kidney injury Hypophosphatemia Hypernatremia  Assessment & Plan:  Acute on chronic hypoxic and hypercarbic respiratory failure due to aspiration pneumonia  Hemorrhage from tracheostomy site (resolved) Severe COPD, not in exacerbation Redo tracheostomy completed 8/22  P: Continue ventilator support with lung protective strategies  Daily PSV trials VAP bundle in place   Left thalamic ICH: Patient was seen on AM assessment 8/24 by nurse to have new right side flaccidity with right facial droop and decreased alertness.  P: Stroke  neurology input is appreciated Keep SBP < 121mmHg, PRN labetalol Holding DAPT until OK with neurology plan to re image 9/7 and re-evaluate. Per cards when given the OK will go with Plavix 75mg  monotherapy.   Acute metabolic encephalopathy due to hypercapnia: improved P: Continue as needed fentanyl IV for agitation and tachypnea Delirium precautions   Chronic systolic and diastolic congestive heart failure Coronary artery disease S/p STEMI with placement of DES 12/2018 on Brillinta P: Monitor I&O  Daily weight  Holding antiplatelets, continue statin  Acute on chronic anemia due to blood loss from tracheostomy site -Readmitted to PCCM on 8/19 for acute on chronic respiratory failure 2/2 hemorrhaging  from her tracheostomy.  P: Hemoglobin goal greater than 7, stable   Best practice:  Diet: NPO, tube feed Pain/Anxiety/Delirium protocol (if indicated): Fentanyl  VAP protocol (if indicated): yes DVT prophylaxis: SCDs, ICH GI prophylaxis: PPI Glucose control: Sliding scale Mobility: BR Code Status: DNR Family Communication: updated son regarding LTACH plan Disposition: ICU, ready for LTACH   CRITICAL CARE Performed by: Lanier Clam   Total critical care time: 30 minutes  Critical care time was exclusive of separately billable procedures and treating other patients.  Critical care was necessary to treat or prevent imminent or life-threatening deterioration.  Critical care was time spent personally by me on the following activities: development of treatment plan with patient and/or surrogate as well as nursing, discussions with consultants, evaluation of patient's response to treatment, examination of patient, obtaining history from patient or surrogate, ordering and performing treatments and interventions, ordering and review of laboratory studies, ordering and review of radiographic studies, pulse oximetry and re-evaluation of patient's condition.

## 2019-11-08 ENCOUNTER — Inpatient Hospital Stay (HOSPITAL_COMMUNITY): Payer: Medicare HMO

## 2019-11-08 DIAGNOSIS — J9801 Acute bronchospasm: Secondary | ICD-10-CM

## 2019-11-08 LAB — POCT I-STAT 7, (LYTES, BLD GAS, ICA,H+H)
Acid-Base Excess: 0 mmol/L (ref 0.0–2.0)
Bicarbonate: 29.7 mmol/L — ABNORMAL HIGH (ref 20.0–28.0)
Calcium, Ion: 1.38 mmol/L (ref 1.15–1.40)
HCT: 34 % — ABNORMAL LOW (ref 36.0–46.0)
Hemoglobin: 11.6 g/dL — ABNORMAL LOW (ref 12.0–15.0)
O2 Saturation: 100 %
Patient temperature: 98.2
Potassium: 4 mmol/L (ref 3.5–5.1)
Sodium: 137 mmol/L (ref 135–145)
TCO2: 32 mmol/L (ref 22–32)
pCO2 arterial: 75.3 mmHg (ref 32.0–48.0)
pH, Arterial: 7.203 — ABNORMAL LOW (ref 7.350–7.450)
pO2, Arterial: 410 mmHg — ABNORMAL HIGH (ref 83.0–108.0)

## 2019-11-08 LAB — GLUCOSE, CAPILLARY
Glucose-Capillary: 112 mg/dL — ABNORMAL HIGH (ref 70–99)
Glucose-Capillary: 125 mg/dL — ABNORMAL HIGH (ref 70–99)
Glucose-Capillary: 192 mg/dL — ABNORMAL HIGH (ref 70–99)
Glucose-Capillary: 225 mg/dL — ABNORMAL HIGH (ref 70–99)
Glucose-Capillary: 257 mg/dL — ABNORMAL HIGH (ref 70–99)
Glucose-Capillary: 84 mg/dL (ref 70–99)

## 2019-11-08 MED ORDER — MIDAZOLAM HCL 2 MG/2ML IJ SOLN
1.0000 mg | Freq: Once | INTRAMUSCULAR | Status: AC
  Administered 2019-11-08: 1 mg via INTRAVENOUS
  Filled 2019-11-08: qty 2

## 2019-11-08 MED ORDER — BUDESONIDE 0.5 MG/2ML IN SUSP
0.5000 mg | Freq: Two times a day (BID) | RESPIRATORY_TRACT | Status: DC
  Administered 2019-11-08 – 2019-11-09 (×2): 0.5 mg via RESPIRATORY_TRACT
  Filled 2019-11-08 (×2): qty 2

## 2019-11-08 MED ORDER — METHYLPREDNISOLONE SODIUM SUCC 125 MG IJ SOLR
60.0000 mg | Freq: Once | INTRAMUSCULAR | Status: AC
  Administered 2019-11-08: 60 mg via INTRAVENOUS
  Filled 2019-11-08: qty 2

## 2019-11-08 MED ORDER — ALBUMIN HUMAN 25 % IV SOLN
12.5000 g | Freq: Once | INTRAVENOUS | Status: AC
  Administered 2019-11-08: 12.5 g via INTRAVENOUS
  Filled 2019-11-08: qty 50

## 2019-11-08 MED ORDER — MAGNESIUM SULFATE 2 GM/50ML IV SOLN
2.0000 g | Freq: Once | INTRAVENOUS | Status: AC
  Administered 2019-11-08: 2 g via INTRAVENOUS
  Filled 2019-11-08: qty 50

## 2019-11-08 MED ORDER — MORPHINE SULFATE (PF) 2 MG/ML IV SOLN
2.0000 mg | Freq: Once | INTRAVENOUS | Status: AC
  Administered 2019-11-08: 2 mg via INTRAVENOUS
  Filled 2019-11-08: qty 1

## 2019-11-08 MED ORDER — ALBUTEROL (5 MG/ML) CONTINUOUS INHALATION SOLN
5.0000 mg/h | INHALATION_SOLUTION | RESPIRATORY_TRACT | Status: DC
  Administered 2019-11-08 – 2019-11-09 (×2): 5 mg/h via RESPIRATORY_TRACT
  Filled 2019-11-08 (×2): qty 20

## 2019-11-08 NOTE — Progress Notes (Signed)
NAME:  Chelsea Allison, MRN:  [id number], DOB:  1955-07-25, LOS: 25 ADMISSION DATE:  10/26/2019, CONSULTATION DATE:  10/26/19 REFERRING MD:  Versie Starks, CHIEF COMPLAINT:  Respiratory failure   Brief History   64 y o F admitted at [facility] 6/29-7/28 for acute respiratory failure 2/2 HCAP/COPD exacerbation. She was unable to be weaned off the vent and was eventually trached on 7/6. She was discharged to [facility]. She was readmitted to [facility] on 8/19 for acute on chronic respiratory failure 2/2 hemorrhaging  from her tracheostomy.   Past Medical History  COPD, AoC respiratory failure, CAD, CHF, HTN  Significant Hospital Events   8/19 admitted to PCCM  Consults:  n/a  Procedures:  8/19 intubated, 8/20 self extubated, 8/20 reintubated due to respiratory distress. 8/19 bronchoscopy [facility] CVL 8/19 >  Significant Diagnostic Tests:  8/19 CXR>>enlarging right pleural effusion with increasing right basilar atelectic changes and subtotal collapse of RML and RLL 8/19 chest x-ray>>Veiling opacity in the right lung favored due to a combination of pleural effusion and atelectasis. Improving ventilation in the left lung since 10/10/2019 with stable residual left upper lung reticulonodular opacity. 8/19 echocardiogram >> EF of 20 to 25% with global akinesis of left ventricle.  Cannot completely exclude mural thrombus at left ventricular apex.  Right ventricular systolic function is mildly reduced with normal size.The inferior vena cava is dilated in size with <50% respiratory  variability, suggesting right atrial pressure of 15 mmHg.  8/20 echocardiogram>> no apical thrombus, EF 25-30% CT head 8/25 >  10 cc acute hematoma centered at the left thalamus, usually hypertensive in this location. MRI/A Brain 8/26 > Similar size/appearance of the acute hemorrhage centered in the left thalamus. Surrounding edema and rightward midline shift. No acute vascular abnormality or underlying vascular lesion identified. Diffuse T1  hypointensity and heterogeneity of the marrow without discrete marrow replacing lesion.  Micro Data:  Blood culture 8/19 > negative  Antimicrobials:  Vancomycin 8/19 - 8/21 Zosyn 8/19 > 8/25  Interim history/subjective:  NAEON. During PS trial trialed trach collar. Patient became tachypneic, desaturated. Hard to bag. Peak pressure elevated on vent, poor volumes. Very wheezy. No evidence of PTX with bedside US. CXR clear, imrproved. Bronch without obstructed trach, scant frothy secretions. Suspect acute bronchospasm. Volumes improving with treatment for such but still with elevated peak pressures. Emptying on flow curve. No evidence of significant gas trapping/autopeep.  Objective   Blood pressure 111/75, pulse 78, temperature 98.2 F (36.8 C), temperature source Axillary, resp. rate 17, height 5\' 9"  (1.753 m), weight 85.8 kg, SpO2 100 %.    Vent Mode: PRVC FiO2 (%):  [40 %-100 %] 40 % Set Rate:  [24 bmp-26 bmp] 26 bmp Vt Set:  [500 mL] 500 mL PEEP:  [5 cmH20] 5 cmH20 Pressure Support:  [18 cmH20] 18 cmH20 Plateau Pressure:  [24 cmH20-26 cmH20] 26 cmH20   Intake/Output Summary (Last 24 hours) at 11/08/2019 1659 Last data filed at 11/08/2019 1100 Gross per 24 hour  Intake 1340 ml  Output 1400 ml  Net -60 ml   Filed Weights   11/06/19 0500 11/07/19 0412 11/08/19 0500  Weight: 85.2 kg 85.5 kg 85.8 kg     Physical exam:  General: Chronically ill female on vent via trach HEENT: 6 shiley cuffed trach in place. Sutures remain. No blood. Neuro: alert, eyes open, Follows commands on the L. R facial droop, right-sided neglect and hemiplegia CV: RRR, no MRG PULM:  Coarse bilaterally GI: Soft, non-tender, non-distended. G tube site CDI, Stoma  pink. Raised erythema in the RLQ, at SQ injections sites.  Extremities: no acute deformity, no edema.  Skin: Grossly intact  Resolved Hospital Problem list   Acute kidney injury Hypophosphatemia Hypernatremia  Assessment & Plan:  Acute on  chronic hypoxic and hypercarbic respiratory failure due to aspiration pneumonia  Hemorrhage from tracheostomy site (resolved) Severe COPD, not in exacerbation Redo tracheostomy completed 8/22 P: Continue ventilator support with lung protective strategies  Daily PSV trials VAP bundle in place  Acute Bronchospasm: Decompensation 9/1 with high peak pressures, poor ventilation. No PTX on bedside US. CXR improved. Likely bronchospasm. P: Continuous albuterol neb IV solumedrol IV mag    Left thalamic ICH: Patient was seen on AM assessment 8/24 by nurse to have new right side flaccidity with right facial droop and decreased alertness.  P: Stroke neurology input is appreciated Keep SBP < 132mmHg, PRN labetalol Holding DAPT until OK with neurology plan to re image 9/7 and re-evaluate. Per cards when given the OK will go with Plavix 75mg  monotherapy.   Acute metabolic encephalopathy due to hypercapnia: improved P: Continue as needed fentanyl IV for agitation and tachypnea Delirium precautions   Chronic systolic and diastolic congestive heart failure Coronary artery disease S/p STEMI with placement of DES 12/2018 on Brillinta P: Monitor I&O  Daily weight  Holding antiplatelets, continue statin  Acute on chronic anemia due to blood loss from tracheostomy site -Readmitted to PCCM on 8/19 for acute on chronic respiratory failure 2/2 hemorrhaging  from her tracheostomy.  P: Hemoglobin goal greater than 7, stable   Best practice:  Diet: NPO, tube feed Pain/Anxiety/Delirium protocol (if indicated): Fentanyl  VAP protocol (if indicated): yes DVT prophylaxis: SCDs, ICH GI prophylaxis: PPI Glucose control: Sliding scale Mobility: BR Code Status: DNR Family Communication: updated son regarding LTACH plan Disposition: ICU, ready for LTACH   CRITICAL CARE Performed by: Lanier Clam   Total critical care time: 55 minutes  Critical care time was exclusive of separately  billable procedures and treating other patients.  Critical care was necessary to treat or prevent imminent or life-threatening deterioration.  Critical care was time spent personally by me on the following activities: development of treatment plan with patient and/or surrogate as well as nursing, discussions with consultants, evaluation of patient's response to treatment, examination of patient, obtaining history from patient or surrogate, ordering and performing treatments and interventions, ordering and review of laboratory studies, ordering and review of radiographic studies, pulse oximetry and re-evaluation of patient's condition.

## 2019-11-08 NOTE — TOC Transition Note (Signed)
Transition of Care [facility]) - CM/SW Discharge Note   Patient Details  Name: Chelsea Allison MRN: [id number] Date of Birth: [birth year]  Transition of Care [facility]) CM/SW Contact:  Carles Collet, RN Phone Number: 11/08/2019, 4:40 PM   Clinical Narrative:   Notified  By [facility] that they have auth and a bed. Per [name] she has notified attending and will call the nurse to give her the number to call report. She will also reach out to son to obtain consent. No other CM intervention needed.    Final next level of care: Long Term Acute Care (LTAC) Barriers to Discharge: Continued Medical Work up   Patient Goals and CMS Choice        Discharge Placement                       Discharge Plan and Services                                     Social Determinants of Health (SDOH) Interventions     Readmission Risk Interventions Readmission Risk Prevention Plan 01/11/2019 12/20/2018 12/20/2018  Transportation Screening Complete - Complete  PCP or Specialist Appt within 5-7 Days - Complete -  PCP or Specialist Appt within 3-5 Days (No Data) - -  Home Care Screening - - Complete  Medication Review (RN CM) - - Complete  HRI or Home Care Consult Complete - -  Social Work Consult for [place] Planning/Counseling Complete - -  Palliative Care Screening Not Applicable - -  Medication Review (RN Care Manager) Complete - -  Some recent data might be hidden

## 2019-11-08 NOTE — Progress Notes (Addendum)
RT note-Placed to ATC for trial, patient does continue to pull at trach, moderate work of breathing however not too much different while weaning on the vent, will monitor for short trial. Post 10 min trial, patient was placed on back to full support due to work of breathing, attempted to suction, difficulty passing catheter. MD called and patient was then bagged with 100%. Patient was then bronched by Dr.Hunsker, no evidence of plugging or obstruction. RR rate decreased to 16, continuous Albuterol ordered, continue to monitor.

## 2019-11-08 NOTE — Progress Notes (Signed)
Nutrition Follow-up  DOCUMENTATION CODES:   Not applicable  INTERVENTION:   Continue TF via G-tube: Vital AF 1.2 at goal rate of 60 ml/h (1440 ml per day) Prosource TF 45 ml BID Free water flushes 200 ml every 3 hours  Provides 1808 kcal, 130 gm protein, 2768 ml free water total daily  NUTRITION DIAGNOSIS:   Inadequate oral intake related to inability to eat as evidenced by NPO status.  Ongoing   GOAL:   Provide needs based on ASPEN/SCCM guidelines  Met with TF  MONITOR:   Vent status, Labs, Weight trends, TF tolerance, Skin, I & O's  REASON FOR ASSESSMENT:   Ventilator    ASSESSMENT:   64 year old female who presented from [facility] on 8/19 with acute on chronic respiratory failure secondary to hemorrhaging from tracheostomy. PMH of recent prolonged hospital admission at [facility] for acute hypoxemic respiratory failure secondary to HCAP/COPD exacerbation with difficulty weaning from the vent s/p trach, COPD, CAD, CHF, HTN, NSCLC s/p partial lobe resection and radiotherapy, rectal cancer s/p colectomy.  Discussed patient in ICU rounds and with RN today. Tolerating TF well at goal rate via G-tube to meet nutrition needs. Patient developed SOB this afternoon; bronchoscopy performed; no plugging or obstruction found.  Currently receiving Vital AF 1.2 at 60 ml/h with Prosource TF 45 ml BID via G-tube. Receiving free water flushes 200 ml every 3 hours.  Patient remains intubated on ventilator support via trach MV: 12.1 L/min Temp (24hrs), Avg:98.5 F (36.9 C), Min:98 F (36.7 C), Max:99.1 F (37.3 C)   Labs reviewed.  CBG: [phone]  Medications reviewed and include Novolog, Miralax, Mag sulfate.  Colostomy output 250 ml x 24 hours UOP 700 ml x 24 hours I/O +12.6 L since admission  Currently 85.8 kg, admission weight 83.1 kg  Diet Order:   Diet Order            Diet NPO time specified  Diet effective now                 EDUCATION NEEDS:   No  education needs have been identified at this time  Skin:  Skin Assessment: Skin Integrity Issues: Skin Integrity Issues:: Stage III Stage III: sacrum  Last BM:  250 ml via colostomy x 24 hours  Height:   Ht Readings from Last 1 Encounters:  10/26/19 _0  (1.753 m)    Weight:   Wt Readings from Last 1 Encounters:  11/08/19 85.8 kg    Ideal Body Weight:  65.9 kg  BMI:  Body mass index is 27.93 kg/m.  Estimated Nutritional Needs:   Kcal:  8329  Protein:  120-140 grams  Fluid:  >/= 1.8 L    Lucas Mallow, RD, LDN, CNSC Please refer to Amion for contact information.

## 2019-11-08 NOTE — Progress Notes (Signed)
ABG results obtained and given to MD.  Increased RR per MD.  Will continue to monitor.    Ref. Range 11/08/2019 15:33  Sample type Unknown ARTERIAL  pH, Arterial Latest Ref Range: 7.35 - 7.45  7.203 (L)  pCO2 arterial Latest Ref Range: 32 - 48 mmHg 75.3 (HH)  pO2, Arterial Latest Ref Range: 83 - 108 mmHg 410 (H)  TCO2 Latest Ref Range: 22 - 32 mmol/L 32  Acid-Base Excess Latest Ref Range: 0.0 - 2.0 mmol/L 0.0  Bicarbonate Latest Ref Range: 20.0 - 28.0 mmol/L 29.7 (H)  O2 Saturation Latest Units: % 100.0  Patient temperature Unknown 98.2 F  Collection site Unknown Radial

## 2019-11-08 NOTE — Progress Notes (Signed)
  Speech Language Pathology  Patient Details Name: Chelsea Allison MRN: [id number] DOB: [birth year] Today's Date: 11/08/2019 Time:  -     She is making strides toward being able to evaluate with inline Passy-Muir speaking valve versus beginning of week. She is currently receiving chest PT with RT. She is alert. RT just switched to pressure support mode.  This therapist and RT may not be able to initiate inline valve today. Plan to return tomorrow.                     [place], [name] 11/08/2019, 8:35 AM  Orbie Pyo Colvin Caroli.Ed Risk analyst [phone] Office [phone]

## 2019-11-08 NOTE — Progress Notes (Addendum)
Called to bedside to assess patient for shortness of breath. Dr. Silas Flood at bedside.  FOB per MD with no obvious mucus plugging. [place] assessment of pleural space per MD shows sliding lung.  Patient given 2mg  IV morphine for work of breathing.  Albuterol, magnesium and steroids ordered per MD.  CXR assessed, images reviewed at bedside, no obvious pneumothorax on portable machine, diaphragmatic tenting noted on right side, trach in good position.  Easily able to pass suction catheter, thin frothy secretions.  RT at bedside for continuous albuterol nebulization.  Adjusted pulmicort dosing to 0.5mg  BID. Wheezing bilaterally on exam, diffusely diminished.  Will continue to monitor.    Chelsea Gens, MSN, NP-C [facility] 11/08/2019, 2:53 PM   Please see [email or web address] for pager details.

## 2019-11-08 NOTE — Progress Notes (Signed)
eLink Physician-Brief Progress Note Patient Name: Chelsea Allison DOB: 07/30/1955 MRN: [id number]   Date of Service  11/08/2019  HPI/Events of Note  Hypotension - BP = 87/44 with MAP = 86. Albumin = 2.8.   eICU Interventions  Plan: 1. 25% albumin 12.5 gm IV x 1 now.      Intervention Category Major Interventions: Hypotension - evaluation and management  Daris Aristizabal Eugene 11/08/2019, 9:54 PM

## 2019-11-08 NOTE — Progress Notes (Signed)
Late entry:  This RN noticed ST elevation in lead V at 1500.  Informed Consuello Masse NP.  EKG ordered.  Irven [place], RN

## 2019-11-09 ENCOUNTER — Other Ambulatory Visit (HOSPITAL_COMMUNITY): Payer: Self-pay

## 2019-11-09 ENCOUNTER — Inpatient Hospital Stay
Admission: AD | Admit: 2019-11-09 | Discharge: 2019-12-08 | Disposition: E | Payer: Medicare HMO | Source: Other Acute Inpatient Hospital | Attending: Internal Medicine | Admitting: Internal Medicine

## 2019-11-09 DIAGNOSIS — J9690 Respiratory failure, unspecified, unspecified whether with hypoxia or hypercapnia: Secondary | ICD-10-CM

## 2019-11-09 DIAGNOSIS — I5041 Acute combined systolic (congestive) and diastolic (congestive) heart failure: Secondary | ICD-10-CM | POA: Diagnosis present

## 2019-11-09 DIAGNOSIS — J189 Pneumonia, unspecified organism: Secondary | ICD-10-CM

## 2019-11-09 DIAGNOSIS — Z931 Gastrostomy status: Secondary | ICD-10-CM

## 2019-11-09 DIAGNOSIS — J9621 Acute and chronic respiratory failure with hypoxia: Secondary | ICD-10-CM | POA: Diagnosis present

## 2019-11-09 DIAGNOSIS — J449 Chronic obstructive pulmonary disease, unspecified: Secondary | ICD-10-CM | POA: Diagnosis present

## 2019-11-09 DIAGNOSIS — J9501 Hemorrhage from tracheostomy stoma: Secondary | ICD-10-CM | POA: Diagnosis present

## 2019-11-09 DIAGNOSIS — I619 Nontraumatic intracerebral hemorrhage, unspecified: Secondary | ICD-10-CM | POA: Diagnosis present

## 2019-11-09 DIAGNOSIS — Z431 Encounter for attention to gastrostomy: Secondary | ICD-10-CM

## 2019-11-09 HISTORY — DX: Hemorrhage from tracheostomy stoma: J95.01

## 2019-11-09 LAB — GLUCOSE, CAPILLARY
Glucose-Capillary: 198 mg/dL — ABNORMAL HIGH (ref 70–99)
Glucose-Capillary: 152 mg/dL — ABNORMAL HIGH (ref 70–99)
Glucose-Capillary: 100 mg/dL — ABNORMAL HIGH (ref 70–99)

## 2019-11-09 LAB — CBC
HCT: 29.1 % — ABNORMAL LOW (ref 36.0–46.0)
Hemoglobin: 8.3 g/dL — ABNORMAL LOW (ref 12.0–15.0)
MCH: 26.2 pg (ref 26.0–34.0)
MCHC: 28.5 g/dL — ABNORMAL LOW (ref 30.0–36.0)
MCV: 91.8 fL (ref 80.0–100.0)
Platelets: 337 10*3/uL (ref 150–400)
RBC: 3.17 MIL/uL — ABNORMAL LOW (ref 3.87–5.11)
RDW: 17.3 % — ABNORMAL HIGH (ref 11.5–15.5)
WBC: 15.1 10*3/uL — ABNORMAL HIGH (ref 4.0–10.5)
nRBC: 0 % (ref 0.0–0.2)

## 2019-11-09 LAB — BLOOD GAS, ARTERIAL
Acid-Base Excess: 1.4 mmol/L (ref 0.0–2.0)
Bicarbonate: 26.1 mmol/L (ref 20.0–28.0)
FIO2: 35
O2 Saturation: 97.5 %
Patient temperature: 37
pCO2 arterial: 46.1 mmHg (ref 32.0–48.0)
pH, Arterial: 7.372 (ref 7.350–7.450)
pO2, Arterial: 94.3 mmHg (ref 83.0–108.0)

## 2019-11-09 LAB — BASIC METABOLIC PANEL
Anion gap: 10 (ref 5–15)
BUN: 33 mg/dL — ABNORMAL HIGH (ref 8–23)
CO2: 24 mmol/L (ref 22–32)
Calcium: 9.8 mg/dL (ref 8.9–10.3)
Chloride: 104 mmol/L (ref 98–111)
Creatinine, Ser: 0.68 mg/dL (ref 0.44–1.00)
GFR calc Af Amer: >60 mL/min (ref >60)
GFR calc non Af Amer: >60 mL/min (ref >60)
Glucose, Bld: 117 mg/dL — ABNORMAL HIGH (ref 70–99)
Potassium: 3 mmol/L — ABNORMAL LOW (ref 3.5–5.1)
Sodium: 138 mmol/L (ref 135–145)

## 2019-11-09 MED ORDER — PREDNISONE 10 MG PO TABS
30.0000 mg | ORAL_TABLET | Freq: Once | ORAL | Status: AC
  Administered 2019-11-09: 30 mg via ORAL
  Filled 2019-11-09: qty 3

## 2019-11-09 MED ORDER — FREE WATER
200.0000 mL | Status: DC
  Administered 2019-11-09: 200 mL

## 2019-11-09 MED ORDER — PREDNISONE 10 MG (21) PO TBPK: 10.0000 mg | ORAL_TABLET | ORAL | Status: DC

## 2019-11-09 MED ORDER — PREDNISONE 10 MG PO TABS: ORAL_TABLET | ORAL | 0 refills | Status: AC

## 2019-11-09 MED ORDER — ACETAMINOPHEN 160 MG/5ML PO SOLN: 650.0000 mg | ORAL | 0 refills | Status: AC | PRN

## 2019-11-09 MED ORDER — PREDNISONE 10 MG (21) PO TBPK: 20.0000 mg | ORAL_TABLET | Freq: Every evening | ORAL | Status: DC

## 2019-11-09 MED ORDER — PREDNISONE 10 MG (21) PO TBPK: 10.0000 mg | ORAL_TABLET | Freq: Three times a day (TID) | ORAL | Status: DC

## 2019-11-09 MED ORDER — PREDNISONE 10 MG (21) PO TBPK: 10.0000 mg | ORAL_TABLET | Freq: Four times a day (QID) | ORAL | Status: DC

## 2019-11-09 MED ORDER — PREDNISONE 10 MG (21) PO TBPK
20.0000 mg | ORAL_TABLET | Freq: Every morning | ORAL | Status: DC
  Filled 2019-11-09: qty 21

## 2019-11-09 NOTE — Progress Notes (Signed)
NAME:  Chelsea Allison, MRN:  [id number], DOB:  1955/10/11, LOS: 1 ADMISSION DATE:  10/26/2019, CONSULTATION DATE:  10/26/19 REFERRING MD:  Versie Starks, CHIEF COMPLAINT:  Respiratory failure   Brief History   21 y o F admitted at [facility] 6/29-7/28 for acute respiratory failure 2/2 HCAP/COPD exacerbation. She was unable to be weaned off the vent and was eventually trached on 7/6. She was discharged to [facility]. She was readmitted to [facility] on 8/19 for acute on chronic respiratory failure 2/2 hemorrhaging  from her tracheostomy.   Past Medical History  COPD, AoC respiratory failure, CAD, CHF, HTN  Significant Hospital Events   8/19 admitted to [facility] 9/1 had bronchospasm with increased work of breathing and high peak pressures on vent requiring continuous albuterol neb  Consults:  n/a  Procedures:  8/19 intubated, 8/20 self extubated, 8/20 reintubated due to respiratory distress. 8/19 bronchoscopy [facility] 8/19 > 9/1 bronchoscopy  Significant Diagnostic Tests:  8/19 CXR>>enlarging right pleural effusion with increasing right basilar atelectic changes and subtotal collapse of RML and RLL 8/19 chest x-ray>>Veiling opacity in the right lung favored due to a combination of pleural effusion and atelectasis. Improving ventilation in the left lung since 10/10/2019 with stable residual left upper lung reticulonodular opacity. 8/19 echocardiogram >> EF of 20 to 25% with global akinesis of left ventricle.  Cannot completely exclude mural thrombus at left ventricular apex.  Right ventricular systolic function is mildly reduced with normal size.The inferior vena cava is dilated in size with <50% respiratory  variability, suggesting right atrial pressure of 15 mmHg.  8/20 echocardiogram>> no apical thrombus, EF 25-30% CT head 8/25 >  10 cc acute hematoma centered at the left thalamus, usually hypertensive in this location. MRI/A Brain 8/26 > Similar size/appearance of the acute hemorrhage centered in the left  thalamus. Surrounding edema and rightward midline shift. No acute vascular abnormality or underlying vascular lesion identified. Diffuse T1 hypointensity and heterogeneity of the marrow without discrete marrow replacing lesion.  Micro Data:  Blood culture 8/19 > negative  Antimicrobials:  Vancomycin 8/19 - 8/21 Zosyn 8/19 > 8/25  Interim history/subjective:  Patient improved with bronchodilator therapy. Bronch performed yesterday with no acute findings. Patient resting comfortably this morning. No new complaints.   Objective   Blood pressure 115/62, pulse 94, temperature 98.4 F (36.9 C), temperature source Oral, resp. rate 18, height 5\' 9"  (1.753 m), weight 84.7 kg, SpO2 100 %.    Vent Mode: PRVC FiO2 (%):  [40 %-100 %] 40 % Set Rate:  [24 bmp-26 bmp] 26 bmp Vt Set:  [500 mL] 500 mL PEEP:  [5 cmH20] 5 cmH20 Pressure Support:  [18 cmH20] 18 cmH20 Plateau Pressure:  [23 cmH20-29 cmH20] 26 cmH20   Intake/Output Summary (Last 24 hours) at 11/26/2019 1152 Last data filed at 11/23/2019 1100 Gross per 24 hour  Intake 1580 ml  Output 1430 ml  Net 150 ml   Filed Weights   11/07/19 0412 11/08/19 0500 11/27/2019 0500  Weight: 85.5 kg 85.8 kg 84.7 kg     Physical exam:  General: Chronically ill female on vent via trach HEENT: 6 shiley cuffed trach in place. Sutures remain. No blood. Neuro: alert, eyes open, Follows commands on the L. R facial droop, right-sided neglect and hemiplegia CV: RRR, no MRG PULM:  No wheezing or rhonchi, decreased air movement GI: Soft, non-tender, non-distended. G tube site CDI, Stoma pink. Raised erythema in the RLQ, at SQ injections sites.  Extremities: no acute deformity, no edema.  Skin:  Grossly intact  Resolved Hospital Problem list   Acute kidney injury Hypophosphatemia Hypernatremia  Assessment & Plan:  Acute on chronic hypoxic and hypercarbic respiratory failure due to aspiration pneumonia  Hemorrhage from tracheostomy site Severe COPD, not in  exacerbation Redo tracheostomy completed 8/22 P: Continue ventilator support with lung protective strategies  Daily PSV trials VAP bundle in place  Acute Bronchospasm: Decompensation 9/1 with high peak pressures, poor ventilation. No PTX on bedside US. CXR improved. Likely bronchospasm. P: Stop continuous albuterol Continue brovana and budesonide nebs IV solumedrol given 9/1, will start prednisone taper today  Left thalamic ICH: Patient was seen on AM assessment 8/24 by nurse to have new right side flaccidity with right facial droop and decreased alertness.  P: Stroke neurology input is appreciated Keep SBP < 135mmHg, PRN labetalol Holding DAPT until OK with neurology plan to re image 9/7 and re-evaluate. Per cards when given the OK will go with Plavix 75mg  monotherapy.   Acute metabolic encephalopathy due to hypercapnia: improved P: Continue as needed fentanyl IV for agitation and tachypnea Delirium precautions   Chronic systolic and diastolic congestive heart failure Coronary artery disease S/p STEMI with placement of DES 12/2018 on Brillinta P: Monitor I&O  Daily weight  Holding antiplatelets, continue statin  Acute on chronic anemia due to blood loss from tracheostomy site -Readmitted to [facility] on 8/19 for acute on chronic respiratory failure 2/2 hemorrhaging  from her tracheostomy.  P: Hemoglobin goal greater than 7, stable  Hypernatremia, resolving - reduced free water 268mL q3 hours to q4hours - Monitor daily BMP and reduce further if needed  Best practice:  Diet: NPO, tube feed Pain/Anxiety/Delirium protocol (if indicated): Fentanyl  VAP protocol (if indicated): yes DVT prophylaxis: SCDs, ICH GI prophylaxis: PPI Glucose control: Sliding scale Mobility: BR Code Status: DNR Family Communication: updated son regarding LTACH plan Disposition: ICU, ready for LTACH   CRITICAL CARE Performed by: Freddi Starr   Total critical care time: 55  minutes  Critical care time was exclusive of separately billable procedures and treating other patients.  Critical care was necessary to treat or prevent imminent or life-threatening deterioration.  Critical care was time spent personally by me on the following activities: development of treatment plan with patient and/or surrogate as well as nursing, discussions with consultants, evaluation of patient's response to treatment, examination of patient, obtaining history from patient or surrogate, ordering and performing treatments and interventions, ordering and review of laboratory studies, ordering and review of radiographic studies, pulse oximetry and re-evaluation of patient's condition.  Freda Jackson, MD [facility] Office: [phone]   See Amion for Pager Details

## 2019-11-09 NOTE — Discharge Summary (Signed)
Physician Discharge Summary  Patient ID: Chelsea Allison MRN: [id number] DOB/AGE: 1956-01-12 64 y.o.  Admit date: 10/26/2019 Discharge date: 12/07/2019  Admitting Physician: Chi Rodman Pickle, MD   Discharge Physician: Freda Jackson, MD  Admission Diagnoses: Respiratory distress [R06.03] Aspiration of foreign body in respiratory tract, initial encounter [T17.908A]  Discharge Diagnoses: Active Problems:   Aspiration of foreign body in respiratory tract   Hemoptysis   [facility])   Intracerebral hemorrhage  Admission Condition: critical  Discharged Condition: serious  Indication for Admission: respiratory failure  Hospital Course:   Came to ICU after adult code blue at [facility]. Bleeding from trach. Difficult to oxygenate through trach. Intubated from above. Large clots removed via bronch. Re-do Trach 8/22 without significant signs of bleeding from stoma or tract. She was supported with full vent support. Unfortunately, developed gaze preference and new right sided facial droop, right hemiplegia. CT showed left cerebral hemorrhage. Antiplatelets were discontinued. Blood pressure was controlled without need for drips or PRN IV antihypertensives. She demonstrated stable exam with right face droop and right hemiplegia. Neurology recommends repeat CT head 9/7 and if stable to resume plavix 75 mg daily. Cardiology agrees plavix monotherapy would be appropriate. In terms of respiratory failure, she has been trialing on pressure support each day. Please see below for a detailed hospital course by problem.  DISCHARGE PLAN BY DIAGNOSIS    Acute on chronic hypoxic and hypercarbic respiratory failure due to aspiration pneumonia  Hemorrhage from tracheostomy site (resolved) Severe COPD, not in exacerbation Redo tracheostomy completed 8/22 P: Continue ventilator support with lung protective strategies  DailyPSV trials VAP bundle in place  Continue brovana and pulmicort nebs Completed  course of antibiotics for Pna  Acute Bronchospasm: Decompensation 9/1 with high peak pressures, poor ventilation. No PTX on bedside US. CXR improved. Likely bronchospasm. P: Was on continuous albuterol which was discontinued 9/2 Continue brovana and budesonide nebs IV solumedrol given 9/1, will start prednisone taper today 53m x 3 days, 373mx 3days, 2089m 3days, 55m69m3 days  Left thalamic ICH: Patient was seen on AM assessment 8/24 by nurse to have new right side flaccidity with right facial droop and decreased alertness.  P: Stroke neurology input is appreciated Keep SBP < 160mm4mnot requiring medications Holding DAPT until OK with neurology plan to re image 9/7 and re-evaluate. Per cards when given the OK will go with Plavix 75mg 61mtherapy.   Acute metabolic encephalopathy due to hypercapnia: Resolved P: Continue as needed fentanyl IV for agitation and tachypnea Delirium precautions   Chronic systolic and diastolic congestive heart failure Coronary artery disease S/p STEMI with placement of DES 12/2018 on Brillinta P: Monitor I&O  Daily weight  Holding antiplatelets, continue statin, plan to resume plavix if CT stable 9/7  Acute on chronic anemia due to blood loss from tracheostomy site Readmitted to [facility] on 8/19 for acute on chronic respiratory failure 2/2 hemorrhaging from her tracheostomy.  P: Hemoglobin goal greater than 7, stable  Consults: cardiology and neurology  Significant Diagnostic Studies: radiology: CT scan: CT head L thalamic bleed  Discharge Diagnoses:  Discharge Plan:                      DISCHARGE SUMMARY                   SIGNIFICANT DIAGNOSTIC STUDIES 8/19 CXR>>enlarging right pleural effusion with increasing right basilar atelectic changes and subtotal collapse of RML and RLL 8/19 chest x-ray>>Veiling opacity in the right lung favored due to a  combination of pleural effusion and atelectasis. Improving ventilation in the left lung since 10/10/2019 with stable residual left upper lung reticulonodular opacity. 8/19 echocardiogram >> EF of 20 to 25% with global akinesis of left ventricle.  Cannot completely exclude mural thrombus at left ventricular apex.  Right ventricular systolic function is mildly reduced with normal size.The inferior vena cava is dilated in size with <50% respiratory  variability, suggesting right atrial pressure of 15 mmHg.  8/20 echocardiogram>> no apical thrombus, EF 25-30% CT head 8/25 >  10 cc acute hematoma centered at the left thalamus, usually hypertensive in this location. MRI/A Brain 8/26 > Similar size/appearance of the acute hemorrhage centered in the left thalamus. Surrounding edema and rightward midline shift. No acute vascular abnormality or underlying vascular lesion identified. Diffuse T1 hypointensity and heterogeneity of the marrow without discrete marrow replacing lesion.  MICRO DATA  Blood culture 8/19 > negative  ANTIBIOTICS Vancomycin 8/19 - 8/21 Zosyn 8/19 > 8/25  Discharge Exam: General: Chronically ill female on vent via trach HEENT: 6 shiley cuffed trach in place. Sutures remain. No blood. Neuro: alert, eyes open, Follows commands on the L. R facial droop, right-sided neglect and hemiplegia CV: RRR, no MRG PULM:  No wheezing or rhonchi, decreased air movement GI: Soft, non-tender, non-distended. G tube site CDI, Stoma pink. Raised erythema in the RLQ, at SQ injections sites.  Extremities: no acute deformity, no edema.  Skin: Grossly intact   Vitals:   11/15/2019 1000 11/08/2019 1118 11/22/2019 1130 11/15/2019 1131  BP: (!) 113/59  115/62   Pulse: 96 94 94   Resp: (!) 29 (!) 26 18   Temp:    98.4 F (36.9 C)  TempSrc:    Oral  SpO2: 100% 100% 100%   Weight:      Height:         Discharge Labs  BMET Recent Labs  Lab 11/03/19 0513 11/03/19 0513 11/04/19 0633 11/04/19 0633  11/06/19 0820 11/06/19 0820 11/08/19 1533 11/10/2019 1240  NA 143  --  143  --  141  --  137 138  K 3.4*   < > 4.8   < > 3.6   < > 4.0 3.0*  CL 106  --  105  --  103  --   --  104  CO2 29  --  26  --  27  --   --  24  GLUCOSE 137*  --  136*  --  122*  --   --  117*  BUN 39*  --  51*  --  39*  --   --  33*  CREATININE 0.67  --  0.96  --  0.74  --   --  0.68  CALCIUM 9.5  --  9.8  --  9.6  --   --  9.8  MG 2.4  --   --   --   --   --   --   --   PHOS 3.1  --   --   --   --   --   --   --    < > =  values in this interval not displayed.    CBC Recent Labs  Lab 11/03/19 0513 11/06/19 0820 11/08/19 1533  HGB 7.7* 8.4* 11.6*  HCT 28.1* 30.0* 34.0*  WBC 11.5* 11.9*  --   PLT 340 428*  --     Anti-Coagulation No results for input(s): INR in the last 168 hours.  Discharge Instructions    Discharge wound care:   Complete by: As directed    Per wound care as above   Increase activity slowly   Complete by: As directed           Allergies as of 11/15/2019      Reactions   Statins Other (See Comments)   Hydrochlorothiazide Other (See Comments)   headaches   Hydrocodone Nausea And Vomiting   Lisinopril Other (See Comments)   Headache   Oxycodone Nausea Only      Medication List    STOP taking these medications   Breo Ellipta 100-25 MCG/INH Aepb Generic drug: fluticasone furoate-vilanterol   enoxaparin 40 MG/0.4ML injection Commonly known as: LOVENOX   ticagrelor 90 MG Tabs tablet Commonly known as: BRILINTA   umeclidinium bromide 62.5 MCG/INH Aepb Commonly known as: INCRUSE ELLIPTA     TAKE these medications   acetaminophen 325 MG tablet Commonly known as: TYLENOL Place 2 tablets (650 mg total) into feeding tube every 6 (six) hours as needed for mild pain, fever or headache. What changed: Another medication with the same name was added. Make sure you understand how and when to take each.   acetaminophen 160 MG/5ML solution Commonly known as: TYLENOL Place  20.3 mLs (650 mg total) into feeding tube every 4 (four) hours as needed for mild pain (or temp > 37.5 C (99.5 F)). What changed: You were already taking a medication with the same name, and this prescription was added. Make sure you understand how and when to take each.   albuterol (2.5 MG/3ML) 0.083% nebulizer solution Commonly known as: PROVENTIL Take 3 mLs (2.5 mg total) by nebulization every 2 (two) hours as needed for wheezing or shortness of breath.   ALPRAZolam 0.25 MG tablet Commonly known as: XANAX Take 0.25 mg by mouth at bedtime as needed for anxiety.   arformoterol 15 MCG/2ML Nebu Commonly known as: BROVANA Take 2 mLs (15 mcg total) by nebulization 2 (two) times daily.   budesonide 0.25 MG/2ML nebulizer solution Commonly known as: PULMICORT Take 2 mLs (0.25 mg total) by nebulization 2 (two) times daily.   buprenorphine 20 MCG/HR Ptwk Commonly known as: BUTRANS Place 1 patch onto the skin once a week.   clonazePAM 0.5 MG tablet Commonly known as: KLONOPIN Place 1 tablet (0.5 mg total) into feeding tube 2 (two) times daily.   cyanocobalamin 1000 MCG tablet Place 1 tablet (1,000 mcg total) into feeding tube every other day.   docusate 50 MG/5ML liquid Commonly known as: COLACE Place 10 mLs (100 mg total) into feeding tube 2 (two) times daily.   feeding supplement (VITAL 1.5 CAL) Liqd Place 1,000 mLs into feeding tube continuous. 60 ml/hour   free water Soln Place 100 mLs into feeding tube every 8 (eight) hours.   furosemide 20 MG tablet Commonly known as: LASIX Take 20 mg by mouth.   guaiFENesin 100 MG/5ML Soln Commonly known as: ROBITUSSIN Place 15 mLs (300 mg total) into feeding tube every 6 (six) hours.   insulin aspart 100 UNIT/ML injection Commonly known as: novoLOG Inject 0-15 Units into the skin every 4 (four) hours.  losartan 25 MG tablet Commonly known as: COZAAR Take 25 mg by mouth daily.   Oxycodone HCl 10 MG Tabs Place 0.5 tablets (5 mg  total) into feeding tube every 6 (six) hours as needed for severe pain.   pantoprazole sodium 40 mg/20 mL Pack Commonly known as: PROTONIX Place 20 mLs (40 mg total) into feeding tube daily.   polyethylene glycol 17 g packet Commonly known as: MIRALAX / GLYCOLAX Place 17 g into feeding tube daily.   predniSONE 10 MG tablet Commonly known as: DELTASONE Take 4 tablets daily for 3 days, then take 3 tablets daily for 3 days, then take 2 tablets daily for 3 days, then 1 tablet daily for 3 days and stop.   QUEtiapine 25 MG tablet Commonly known as: SEROQUEL Place 1 tablet (25 mg total) into feeding tube 2 (two) times daily.   revefenacin 175 MCG/3ML nebulizer solution Commonly known as: YUPELRI Take 3 mLs (175 mcg total) by nebulization daily.   rosuvastatin 20 MG tablet Commonly known as: CRESTOR Place 1 tablet (20 mg total) into feeding tube daily.   Vitamin D (Ergocalciferol) 1.25 MG (50000 UNIT) Caps capsule Commonly known as: DRISDOL Place 1 capsule (50,000 Units total) into feeding tube every 7 (seven) days.            Discharge Care Instructions  (From admission, onward)         Start     Ordered   11/24/2019 0000  Discharge wound care:       Comments: Per wound care as above   11/18/2019 1343            Disposition: Discharge to [facility]  Discharged Condition: Chelsea Allison has met maximum benefit of inpatient care and is medically stable and cleared for discharge.  Patient is pending follow up as above.      Time spent on disposition:  30 Minutes.   Signed: Freda Jackson, MD [facility] Office: [phone]   See Amion for Pager Details

## 2019-11-09 NOTE — Progress Notes (Signed)
Report called to [place], RT on [facility]. Pt transported  From [facility] 09 to [facility] with no complications.

## 2019-11-09 NOTE — Progress Notes (Signed)
Occupational Therapy Treatment Patient Details Name: Chelsea Allison MRN: [id number] DOB: 1955/07/27 Today's Date: 11/18/2019    History of present illness 64 year old female who recently had prolonged hospitalization at [facility] for acute hypoxemic respiratory failure with difficulty weaning from the vent s/p trach 7/6. She was discharged to [facility] for further respiratory management and weaning. On 8/19 patient had Code Blue called and resident team and anesthesia arrived. Patient had bleeding airway and respiratory arrest in the 60s. Left chest needle decompression performed for possible pneumothorax. PCCM was called to the bedside by anesthesia for assistance of bleeding airway..  Decision made to transfer pt directly to [facility] medical ICU.   [place]: STEMI, neuropathy, ICM, HT, COPD, HF, lung CA   OT comments  Pt more alert today, particularly when assisted to sit at EOB. Following some commands with increased time and multimodal cues. Engaging trunk for improved sitting posture at EOB. Pt stood x 4 with +2 max assist. Pt with R inattention and impaired sensation on R with no volitional movement noted in UE, + shoulder subluxation. Pt with stable VS throughout session on vent support. Will continue to follow.  Follow Up Recommendations  LTACH    Equipment Recommendations  None recommended by OT    Recommendations for Other Services      Precautions / Restrictions Precautions Precautions: Fall;Other (comment) Precaution Comments: trach/vent, PEG, sacral wounds, colostomy       Mobility Bed Mobility Overal bed mobility: Needs Assistance Bed Mobility: Rolling;Supine to Sit;Sit to Supine Rolling: +2 for physical assistance;Total assist   Supine to sit: +2 for physical assistance;Max assist Sit to supine: +2 for physical assistance;Total assist   General bed mobility comments: assisted LEs to EOB to raise trunk and position hips at EOB with bed pad, total assist of 2 to return to supine and roll  toward R for positioning  Transfers Overall transfer level: Needs assistance   Transfers: Sit to/from Stand Sit to Stand: +2 physical assistance;Max assist         General transfer comment: stood x 4 from EOB with bed pad under hips    Balance Overall balance assessment: Needs assistance   Sitting balance-Leahy Scale: Poor Sitting balance - Comments: sat EOB x 20 minutes, facilitating and verbally cuing for upright posture and shoulder retraction     Standing balance-Leahy Scale: Zero                             ADL either performed or assessed with clinical judgement   ADL                                         General ADL Comments: continues to require total care     Vision   Additional Comments: R inattention   Perception     Praxis      Cognition Arousal/Alertness: Awake/alert (much more alert once seated at EOB) Behavior During Therapy: Flat affect Overall Cognitive Status: Difficult to assess                                 General Comments: alert and following commands inconsistently with increased time and multimodal cues        Exercises Exercises: General Upper Extremity General Exercises - Upper Extremity Shoulder Flexion: Right;5  reps;Supine;PROM Elbow Flexion: Right;5 reps;Supine;PROM Elbow Extension: PROM;Right;5 reps;Supine Wrist Flexion: PROM;Right;5 reps;Supine Wrist Extension: PROM;Right;5 reps;Supine Digit Composite Flexion: PROM;Both;5 reps;Supine Composite Extension: PROM;Right;5 reps;Supine   Shoulder Instructions       General Comments      Pertinent Vitals/ Pain       Pain Assessment: Faces Faces Pain Scale: No hurt  Home Living                                          Prior Functioning/Environment              Frequency  Min 2X/week        Progress Toward Goals  OT Goals(current goals can now be found in the care plan section)  Progress towards  OT goals: Progressing toward goals  Acute Rehab OT Goals Patient Stated Goal: not stated OT Goal Formulation: Patient unable to participate in goal setting Time For Goal Achievement: 11/16/19 Potential to Achieve Goals: [place] Discharge plan remains appropriate    Co-evaluation    PT/OT/SLP Co-Evaluation/Treatment: Yes Reason for Co-Treatment: Complexity of the patient's impairments (multi-system involvement);For patient/therapist safety   OT goals addressed during session: Strengthening/ROM      AM-PAC OT "6 Clicks" Daily Activity     Outcome Measure   Help from another person eating meals?: Total Help from another person taking care of personal grooming?: Total Help from another person toileting, which includes using toliet, bedpan, or urinal?: Total Help from another person bathing (including washing, rinsing, drying)?: Total Help from another person to put on and taking off regular upper body clothing?: Total Help from another person to put on and taking off regular lower body clothing?: Total 6 Click Score: 6    End of Session    OT Visit Diagnosis: Other abnormalities of gait and mobility (R26.89);Muscle weakness (generalized) (M62.81);Other symptoms and signs involving cognitive function;Hemiplegia and hemiparesis Hemiplegia - Right/Left: Right Hemiplegia - dominant/non-dominant: Dominant Hemiplegia - caused by: Cerebral infarction   Activity Tolerance Patient tolerated treatment well   Patient Left in bed;with call bell/phone within reach;with bed alarm set   Nurse Communication          Time: 7824-2353 OT Time Calculation (min): 34 min  Charges: OT General Charges $OT Visit: 1 Visit OT Treatments $Neuromuscular Re-education: 8-22 mins  Nestor Lewandowsky, OTR/L Acute Rehabilitation Services Pager: [phone] Office: [phone]   Malka So 11/08/2019, 12:09 PM

## 2019-11-09 NOTE — TOC Progression Note (Addendum)
Transition of Care [facility]) - Progression Note    Patient Details  Name: Chelsea Allison MRN: [id number] Date of Birth: January 20, 1956  Transition of Care [facility]) CM/SW [place], RN Phone Number: 11/13/2019, 10:26 AM  Clinical Narrative:    Received call from [place] from [facility] bed is available but need to know if the patient will discharge today. I called and spoke to MD for today. He will have to assess her first and call me back with the answer. 11000 patient assessed  Will be going to room 5E03  Number for report is [phone]  Accepting is Deatra Canter hijazi     Expected Discharge Plan: Long Term Acute Care (LTAC) Barriers to Discharge: Continued Medical Work up  Expected Discharge Plan and Services Expected Discharge Plan: [place] (LTAC)                                               Social Determinants of Health (SDOH) Interventions    Readmission Risk Interventions Readmission Risk Prevention Plan 01/11/2019 12/20/2018 12/20/2018  Transportation Screening Complete - Complete  PCP or Specialist Appt within 5-7 Days - Complete -  PCP or Specialist Appt within 3-5 Days (No Data) - -  Home Care Screening - - Complete  Medication Review (RN CM) - - Complete  HRI or Home Care Consult Complete - -  Social Work Consult for [place] Planning/Counseling Complete - -  Palliative Care Screening Not Applicable - -  Medication Review (RN Care Manager) Complete - -  Some recent data might be hidden

## 2019-11-09 NOTE — Progress Notes (Signed)
Physical Therapy Treatment Patient Details Name: Chelsea Allison MRN: [id number] DOB: Apr 25, 1955 Today's Date: 12/03/2019    History of Present Illness 64 year old female who recently had prolonged hospitalization at [facility] for acute hypoxemic respiratory failure with difficulty weaning from the vent s/p trach 7/6. [facility] was [facility] for further respiratory management and weaning. On 8/19 patient had [place] called and resident team and anesthesia arrived. Patient had bleeding airway and respiratory arrest in the 60s. Left chest needle decompression performed for possible pneumothorax. PCCM was called to the bedside by anesthesia for assistance of bleeding airway..  Decision made to transfer pt directly to [facility] medical ICU.   Lane: STEMI, neuropathy, ICM, HT, COPD, HF, lung CA    PT Comments    Pt much improved after poor status yesterday.  She was alert, participative, though limited in function due to lower functioning right side.  Emphasis on transitions, sitting balance and sit to stand x4.  Follow Up Recommendations  LTACH     Equipment Recommendations  None recommended by PT    Recommendations for Other Services       Precautions / Restrictions Precautions Precautions: Fall;Other (comment) Precaution Comments: trach/vent, PEG, sacral wounds, colostomy    Mobility  Bed Mobility Overal bed mobility: Needs Assistance Bed Mobility: Rolling;Supine to Sit;Sit to Supine Rolling: +2 for physical assistance;Total assist   Supine to sit: +2 for physical assistance;Max assist Sit to supine: +2 for physical assistance;Total assist   General bed mobility comments: assisted LEs to EOB to raise trunk and position hips at EOB with bed pad, total assist of 2 to return to supine and roll toward R for positioning  Transfers Overall transfer level: Needs assistance   Transfers: Sit to/from Stand Sit to Stand: +2 physical assistance;Max assist         General transfer comment:  stood x 4 from EOB with bed pad under hips  Ambulation/Gait             General Gait Details: not able   Stairs             Wheelchair Mobility    Modified Rankin (Stroke Patients Only) Modified Rankin (Stroke Patients Only) Pre-Morbid Rankin Score: Moderate disability Modified Rankin: Severe disability     Balance Overall balance assessment: Needs assistance   Sitting balance-Leahy Scale: Poor Sitting balance - Comments: sat EOB x 20 minutes, facilitating and verbally cuing for upright posture and shoulder retraction     Standing balance-Leahy Scale: Zero                              Cognition Arousal/Alertness: Awake/alert (much more alert once seated at EOB) Behavior During Therapy: Flat affect Overall Cognitive Status: Difficult to assess                                 General Comments: alert and following commands inconsistently with increased time and multimodal cues      Exercises General Exercises - Upper Extremity Shoulder Flexion: Right;5 reps;Supine;PROM Elbow Flexion: Right;5 reps;Supine;PROM Elbow Extension: PROM;Right;5 reps;Supine Wrist Flexion: PROM;Right;5 reps;Supine Wrist Extension: PROM;Right;5 reps;Supine Digit Composite Flexion: PROM;Both;5 reps;Supine Composite Extension: PROM;Right;5 reps;Supine General Exercises - Lower Extremity Heel Slides: AROM;Both;10 reps;Supine (aa/p on the R and resisted on the L LE)    General Comments        Pertinent Vitals/Pain Pain Assessment:  Faces Faces Pain Scale: No hurt    Home Living                      Prior Function            PT Goals (current goals can now be found in the care plan section) Acute Rehab PT Goals Patient Stated Goal: not stated PT Goal Formulation: With patient Time For Goal Achievement: 11/16/19 Potential to Achieve Goals: Fair Progress towards PT goals: Progressing toward goals    Frequency    Min 2X/week       PT Plan Current plan remains appropriate    Co-evaluation PT/OT/SLP Co-Evaluation/Treatment: Yes Reason for Co-Treatment: Complexity of the patient's impairments (multi-system involvement) PT goals addressed during session: Mobility/safety with mobility OT goals addressed during session: Strengthening/ROM      AM-PAC PT "6 Clicks" Mobility   Outcome Measure  Help needed turning from your back to your side while in a flat bed without using bedrails?: Total Help needed moving from lying on your back to sitting on the side of a flat bed without using bedrails?: Total Help needed moving to and from a bed to a chair (including a wheelchair)?: Total Help needed standing up from a chair using your arms (e.g., wheelchair or bedside chair)?: Total Help needed to walk in hospital room?: Total Help needed climbing 3-5 steps with a railing? : Total 6 Click Score: 6    End of Session Equipment Utilized During Treatment: Oxygen Activity Tolerance: Treatment limited secondary to agitation;Patient limited by lethargy Patient left: in bed;with call bell/phone within reach;with restraints reapplied Nurse Communication: Mobility status PT Visit Diagnosis: Other abnormalities of gait and mobility (R26.89);Other symptoms and signs involving the nervous system (R29.898);Muscle weakness (generalized) (M62.81)     Time: 0981-1914 PT Time Calculation (min) (ACUTE ONLY): 34 min  Charges:  $Therapeutic Activity: 8-22 mins                     11/29/2019  Chelsea Allison., PT Acute Rehabilitation Services [phone]  (pager) [phone]  (office)   Chelsea Allison 11/17/2019, 4:31 PM

## 2019-11-09 NOTE — Progress Notes (Signed)
Report called to [facility] on [facility]. Discharge instructions sent with patient. Pt alert,  stable on vent, 40 % FIO2, sats 100%

## 2019-11-10 ENCOUNTER — Other Ambulatory Visit (HOSPITAL_COMMUNITY): Payer: Self-pay

## 2019-11-10 ENCOUNTER — Encounter: Payer: Self-pay | Admitting: Internal Medicine

## 2019-11-10 DIAGNOSIS — J9501 Hemorrhage from tracheostomy stoma: Secondary | ICD-10-CM | POA: Diagnosis present

## 2019-11-10 LAB — CBC WITH DIFFERENTIAL/PLATELET
Abs Immature Granulocytes: 0.06 10*3/uL (ref 0.00–0.07)
Basophils Absolute: 0 10*3/uL (ref 0.0–0.1)
Basophils Relative: 0 %
Eosinophils Absolute: 0 10*3/uL (ref 0.0–0.5)
Eosinophils Relative: 0 %
HCT: 31.8 % — ABNORMAL LOW (ref 36.0–46.0)
Hemoglobin: 9.2 g/dL — ABNORMAL LOW (ref 12.0–15.0)
Immature Granulocytes: 1 %
Lymphocytes Relative: 2 %
Lymphs Abs: 0.3 10*3/uL — ABNORMAL LOW (ref 0.7–4.0)
MCH: 26.9 pg (ref 26.0–34.0)
MCHC: 28.9 g/dL — ABNORMAL LOW (ref 30.0–36.0)
MCV: 93 fL (ref 80.0–100.0)
Monocytes Absolute: 0.2 10*3/uL (ref 0.1–1.0)
Monocytes Relative: 2 %
Neutro Abs: 11.6 10*3/uL — ABNORMAL HIGH (ref 1.7–7.7)
Neutrophils Relative %: 95 %
Platelets: 467 10*3/uL — ABNORMAL HIGH (ref 150–400)
RBC: 3.42 MIL/uL — ABNORMAL LOW (ref 3.87–5.11)
RDW: 17.4 % — ABNORMAL HIGH (ref 11.5–15.5)
WBC: 12.2 10*3/uL — ABNORMAL HIGH (ref 4.0–10.5)
nRBC: 0 % (ref 0.0–0.2)

## 2019-11-10 LAB — COMPREHENSIVE METABOLIC PANEL
ALT: 29 U/L (ref 0–44)
AST: 25 U/L (ref 15–41)
Albumin: 2.9 g/dL — ABNORMAL LOW (ref 3.5–5.0)
Alkaline Phosphatase: 116 U/L (ref 38–126)
Anion gap: 10 (ref 5–15)
BUN: 35 mg/dL — ABNORMAL HIGH (ref 8–23)
CO2: 25 mmol/L (ref 22–32)
Calcium: 10 mg/dL (ref 8.9–10.3)
Chloride: 100 mmol/L (ref 98–111)
Creatinine, Ser: 0.78 mg/dL (ref 0.44–1.00)
GFR calc Af Amer: >60 mL/min (ref >60)
GFR calc non Af Amer: >60 mL/min (ref >60)
Glucose, Bld: 218 mg/dL — ABNORMAL HIGH (ref 70–99)
Potassium: 4.2 mmol/L (ref 3.5–5.1)
Sodium: 135 mmol/L (ref 135–145)
Total Bilirubin: 0.5 mg/dL (ref 0.3–1.2)
Total Protein: 6.5 g/dL (ref 6.5–8.1)

## 2019-11-10 LAB — BLOOD GAS, ARTERIAL
Acid-Base Excess: 2.6 mmol/L — ABNORMAL HIGH (ref 0.0–2.0)
Bicarbonate: 27.7 mmol/L (ref 20.0–28.0)
FIO2: 50
O2 Saturation: 98.9 %
Patient temperature: 37
pCO2 arterial: 51.5 mmHg — ABNORMAL HIGH (ref 32.0–48.0)
pH, Arterial: 7.349 — ABNORMAL LOW (ref 7.350–7.450)
pO2, Arterial: 132 mmHg — ABNORMAL HIGH (ref 83.0–108.0)

## 2019-11-10 MED ORDER — IOHEXOL 300 MG/ML  SOLN
40.0000 mL | Freq: Once | INTRAMUSCULAR | Status: AC | PRN
  Administered 2019-11-10: 40 mL

## 2019-11-10 NOTE — Consult Note (Signed)
Pulmonary [place]  PULMONARY SERVICE  Date of Service: 11/10/2019  PULMONARY CRITICAL CARE CONSULT   Phillis Thackeray  VVO:[id number]  DOB: 08-08-1955   DOA: 12/01/2019  Referring Physician: Merton Border, MD  HPI: Francene Mcerlean is a 64 y.o. female seen for follow up of Acute on Chronic Respiratory Failure.  Patient with multiple medical problems including coronary artery disease COPD cancer respiratory failure who [facility] on protocol however had some bleeding from the tracheostomy so was sent down to the intensive care unit.  Patient had a bronchoscopy done found to have large amounts of clot tracheostomy redo was done on August 22.  Patient had a CT scan of the head done because of facial droop and mental status changes which showed a new left-sided cerebral hemorrhage.  Blood pressure was controlled antiplatelets were decreased.  Patient had neurology follow-up done and suggested a repeat CT scan and then resume Plavix if everything looks good.  At this time patient appears to be comfortable right now without distress  Review of Systems:  ROS performed and is unremarkable other than noted above.  Past Medical History:  Diagnosis Date  . Acute on chronic respiratory failure with hypoxia ([place])   . Acute on chronic respiratory failure with hypoxia ([place])   . CAD (coronary artery disease) 10/20 DES to pRCA, DES mCx   . Cancer of lung ([place])    Twice  . Chronic congestive heart failure ([place])   . Chronic HFrEF (heart failure with reduced ejection fraction) ([place])   . Colon cancer ([place])   . COPD (chronic obstructive pulmonary disease) ([place])   . COPD, severe ([place])   . COPD, severe ([place])   . Coronary artery disease involving native coronary artery of native heart without angina pectoris   . Hyperlipidemia   . Hypertension   . Ischemic cardiomyopathy   . Neuropathy    Secondary to chemo  . STEMI (ST elevation myocardial infarction)  ([place]) 12/12/2018  . Tobacco abuse     Past Surgical History:  Procedure Laterality Date  . ABDOMINAL SURGERY     For colon cancer  . COLOSTOMY    . CORONARY STENT INTERVENTION N/A 12/20/2018   Procedure: CORONARY STENT INTERVENTION - Right;  Surgeon: Burnell Blanks, MD;  Location: [place] [facility];  Service: Cardiovascular;  Laterality: N/A;  . CORONARY/GRAFT ACUTE MI REVASCULARIZATION N/A 12/12/2018   Procedure: Coronary/Graft Acute MI Revascularization;  Surgeon: Belva Crome, MD;  Location: [place] [facility];  Service: Cardiovascular;  Laterality: N/A;  . IR GASTROSTOMY TUBE MOD SED  10/20/2019  . LEFT HEART CATH AND CORONARY ANGIOGRAPHY N/A 08/11/2019   Procedure: LEFT HEART CATH AND CORONARY ANGIOGRAPHY;  Surgeon: Burnell Blanks, MD;  Location: [place] [facility];  Service: Cardiovascular;  Laterality: N/A;  . LEFT HEART CATH AND CORONARY ANGIOGRAPHY N/A 12/12/2018   Procedure: LEFT HEART CATH AND CORONARY ANGIOGRAPHY;  Surgeon: Belva Crome, MD;  Location: [place] [facility];  Service: Cardiovascular;  Laterality: N/A;    Social History:    reports that she has been smoking cigarettes. She has never used smokeless tobacco. She reports that she does not drink alcohol and does not use drugs.  Family History: Non-Contributory to the present illness  Allergies  Allergen Reactions  . Statins Other (See Comments)  . Hydrochlorothiazide Other (See Comments)    headaches  . Hydrocodone Nausea And Vomiting  . Lisinopril Other (See Comments)  Headache   . Oxycodone Nausea Only    Medications: Reviewed on Rounds  Physical Exam:  Vitals: Temperature is 98.1 pulse 88 respiratory rate 20 blood pressure is 159/91 saturations 100%  Ventilator Settings mode ventilation assist control FiO2 50% tidal volume 800 PEEP 5  . General: Comfortable at this time . Eyes: Grossly normal lids, irises & conjunctiva . ENT: grossly tongue is normal . Neck: no obvious  mass . Cardiovascular: S1-S2 normal no gallop or rub . Respiratory: No rhonchi no rales are noted at this time . Abdomen: Soft and nontender . Skin: no rash seen on limited exam . Musculoskeletal: not rigid . Psychiatric:unable to assess . Neurologic: no seizure no involuntary movements         Labs on Admission:  Basic Metabolic Panel: Recent Labs  Lab 11/04/19 0633 11/06/19 0820 11/08/19 1533 11/25/2019 1240  NA 143 141 137 138  K 4.8 3.6 4.0 3.0*  CL 105 103  --  104  CO2 26 27  --  24  GLUCOSE 136* 122*  --  117*  BUN 51* 39*  --  33*  CREATININE 0.96 0.74  --  0.68  CALCIUM 9.8 9.6  --  9.8    Recent Labs  Lab 11/08/19 1533 11/16/2019 1859 11/10/19 0825  PHART 7.203* 7.372 7.349*  PCO2ART 75.3* 46.1 51.5*  PO2ART 410* 94.3 132*  HCO3 29.7* 26.1 27.7  O2SAT 100.0 97.5 98.9    Liver Function Tests: No results for input(s): AST, ALT, ALKPHOS, BILITOT, PROT, ALBUMIN in the last 168 hours. No results for input(s): LIPASE, AMYLASE in the last 168 hours. No results for input(s): AMMONIA in the last 168 hours.  CBC: Recent Labs  Lab 11/06/19 0820 11/08/19 1533 11/26/2019 1240  WBC 11.9*  --  15.1*  HGB 8.4* 11.6* 8.3*  HCT 30.0* 34.0* 29.1*  MCV 94.6  --  91.8  PLT 428*  --  337    Cardiac Enzymes: No results for input(s): CKTOTAL, CKMB, CKMBINDEX, TROPONINI in the last 168 hours.  BNP (last 3 results) Recent Labs    08/13/19 0225 09/05/19 0735 10/10/19 1120  BNP 929.4* 2,477.5* 973.2*    ProBNP (last 3 results) No results for input(s): PROBNP in the last 8760 hours.   Radiological Exams on Admission: DG Abd 1 View  Result Date: 11/10/2019 CLINICAL DATA:  Peg tube placement EXAM: ABDOMEN - 1 VIEW COMPARISON:  10/22/2019 FINDINGS: Supine frontal view of the abdomen and pelvis was performed after the administration of 40 cc of Omnipaque 300 via the indwelling PEG tube by the clinician. Contrast is seen outlining the gastric rugal folds. No evidence of  contrast extravasation. Bowel gas pattern is unremarkable. IMPRESSION: 1. Peg tube within the gastric lumen as above. No evidence of contrast extravasation. Electronically Signed   By: Randa Ngo M.D.   On: 11/10/2019 01:18   DG CHEST PORT 1 VIEW  Result Date: 11/22/2019 CLINICAL DATA:  Respiratory failure.  History of lung cancer EXAM: PORTABLE CHEST 1 VIEW COMPARISON:  11/08/2019 FINDINGS: Tracheostomy in satisfactory position. Density overlying the right hilum unchanged with surgical clips present from prior lobectomy. This is stable. Progression of left lower lobe atelectasis/infiltrate. Small pleural effusions bilaterally. Diffuse airspace disease in the left lung particular in the upper lobe shows mild interval improvement. IMPRESSION: Progression of left lower lobe atelectasis/infiltrate. Small bilateral effusions Improvement in left upper lobe airspace disease which may be clearing pneumonia or edema. Electronically Signed   By: Franchot Gallo M.D.  On: 11/11/2019 17:41   DG CHEST PORT 1 VIEW  Result Date: 11/08/2019 CLINICAL DATA:  64 year old female with oxygen desaturation. Persistent respiratory failure. History of lung cancer. Shock, hemoptysis, intracranial hemorrhage. EXAM: PORTABLE CHEST 1 VIEW COMPARISON:  Portable chest 11/03/2019 and earlier. FINDINGS: Portable AP semi upright view at 1427 hours. Stable tracheostomy. Continued abnormal appearance of the superior right hilum where surgical clips are noted. Suspect previous right upper lobectomy. Stable cardiac size and mediastinal contours. Asymmetric mild increased interstitial opacity in the left lung is stable. Improved retrocardiac ventilation on the left since 11/03/2019 with streaky you residual opacity. No pneumothorax. No areas of worsening ventilation. Stable visualized osseous structures. IMPRESSION: 1. Mildly improved left lung base ventilation since 11/03/2019. Residual streaky opacity. 2. No new cardiopulmonary abnormality.  Suspect previous right upper lobectomy. Electronically Signed   By: Genevie Ann M.D.   On: 11/08/2019 14:47    Assessment/Plan Active Problems:   Acute combined systolic and diastolic heart failure (HCC)   Acute on chronic respiratory failure with hypoxia (HCC)   COPD, severe (HCC)   Intracerebral hemorrhage   Tracheal hemorrhage ([place])   1. Acute on chronic respiratory failure with hypoxia at this time patient is on the ventilator and assist control mode.  Apparently with response this morning requiring increased ventilator support.  ABG results as noted above.  Currently appears to be more comfortable spoke with respiratory therapy try to wean down the FiO2 2. Acute tracheal bleed now improved we will continue to monitor for any recurrence. 3. Severe COPD medical management nebulizers as necessary. 4. Nontraumatic intracranial hemorrhage patient had new right-sided flaccidity and facial droop patient will have repeat CT scan on the 13th to reassess anticoagulation. 5. Chronic heart failure reduced ejection fraction supportive care monitor fluid status closely diuresis as tolerated  I have personally seen and evaluated the patient, evaluated laboratory and imaging results, formulated the assessment and plan and placed orders. The Patient requires high complexity decision making with multiple systems involvement.  Case was discussed on Rounds with the Respiratory Therapy Director and the Respiratory staff Time Spent 53minutes  Evalin Shawhan A Maudene Stotler, MD [facility] Pulmonary Critical Care Medicine Sleep Medicine

## 2019-11-11 DIAGNOSIS — I610 Nontraumatic intracerebral hemorrhage in hemisphere, subcortical: Secondary | ICD-10-CM

## 2019-11-11 DIAGNOSIS — J449 Chronic obstructive pulmonary disease, unspecified: Secondary | ICD-10-CM

## 2019-11-11 DIAGNOSIS — I5041 Acute combined systolic (congestive) and diastolic (congestive) heart failure: Secondary | ICD-10-CM

## 2019-11-11 DIAGNOSIS — J9621 Acute and chronic respiratory failure with hypoxia: Secondary | ICD-10-CM

## 2019-11-11 DIAGNOSIS — J9501 Hemorrhage from tracheostomy stoma: Secondary | ICD-10-CM

## 2019-11-11 NOTE — Progress Notes (Signed)
Pulmonary Critical Care Medicine [place]   PULMONARY CRITICAL CARE SERVICE  PROGRESS NOTE  Date of Service: 11/11/2019  Chelsea Allison  PXT:[id number]  DOB: 1955/10/27   DOA: 11/22/2019  Referring Physician: Merton Border, MD  HPI: Chelsea Allison is a 64 y.o. female seen for follow up of Acute on Chronic Respiratory Failure.  On full support on assist control mode on 35% FiO2.  Patient's been trying weaning protocol slightly.  Medications: Reviewed on Rounds  Physical Exam:  Vitals: Temperature 96.7 pulse 76 respiratory rate 12 blood pressure is 110/63 saturations 100%  Ventilator Settings on assist control FiO2 35% tidal volume is 500 PEEP 5  . General: Comfortable at this time . Eyes: Grossly normal lids, irises & conjunctiva . ENT: grossly tongue is normal . Neck: no obvious mass . Cardiovascular: S1 S2 normal no gallop . Respiratory: No rhonchi no rales are noted at this time . Abdomen: soft . Skin: no rash seen on limited exam . Musculoskeletal: not rigid . Psychiatric:unable to assess . Neurologic: no seizure no involuntary movements         Lab Data:   Basic Metabolic Panel: Recent Labs  Lab 11/06/19 0820 11/08/19 1533 11/12/2019 1240 11/10/19 1040  NA 141 137 138 135  K 3.6 4.0 3.0* 4.2  CL 103  --  104 100  CO2 27  --  24 25  GLUCOSE 122*  --  117* 218*  BUN 39*  --  33* 35*  CREATININE 0.74  --  0.68 0.78  CALCIUM 9.6  --  9.8 10.0    ABG: Recent Labs  Lab 11/08/19 1533 11/29/2019 1859 11/10/19 0825  PHART 7.203* 7.372 7.349*  PCO2ART 75.3* 46.1 51.5*  PO2ART 410* 94.3 132*  HCO3 29.7* 26.1 27.7  O2SAT 100.0 97.5 98.9    Liver Function Tests: Recent Labs  Lab 11/10/19 1040  AST 25  ALT 29  ALKPHOS 116  BILITOT 0.5  PROT 6.5  ALBUMIN 2.9*   No results for input(s): LIPASE, AMYLASE in the last 168 hours. No results for input(s): AMMONIA in the last 168 hours.  CBC: Recent Labs  Lab 11/06/19 0820 11/08/19 1533  11/20/2019 1240 11/10/19 1040  WBC 11.9*  --  15.1* 12.2*  NEUTROABS  --   --   --  11.6*  HGB 8.4* 11.6* 8.3* 9.2*  HCT 30.0* 34.0* 29.1* 31.8*  MCV 94.6  --  91.8 93.0  PLT 428*  --  337 467*    Cardiac Enzymes: No results for input(s): CKTOTAL, CKMB, CKMBINDEX, TROPONINI in the last 168 hours.  BNP (last 3 results) Recent Labs    08/13/19 0225 09/05/19 0735 10/10/19 1120  BNP 929.4* 2,477.5* 973.2*    ProBNP (last 3 results) No results for input(s): PROBNP in the last 8760 hours.  Radiological Exams: DG Abd 1 View  Result Date: 11/10/2019 CLINICAL DATA:  Peg tube placement EXAM: ABDOMEN - 1 VIEW COMPARISON:  10/22/2019 FINDINGS: Supine frontal view of the abdomen and pelvis was performed after the administration of 40 cc of Omnipaque 300 via the indwelling PEG tube by the clinician. Contrast is seen outlining the gastric rugal folds. No evidence of contrast extravasation. Bowel gas pattern is unremarkable. IMPRESSION: 1. Peg tube within the gastric lumen as above. No evidence of contrast extravasation. Electronically Signed   By: Randa Ngo M.D.   On: 11/10/2019 01:18    Assessment/Plan Active Problems:   Acute combined systolic and diastolic heart failure ([place])  Acute on chronic respiratory failure with hypoxia (HCC)   COPD, severe (HCC)   Intracerebral hemorrhage   Tracheal hemorrhage (HCC)   1. Acute on chronic respiratory failure hypoxia we will continue with full support on the ventilator respiratory therapy will continue to check the RSB I mechanics and try to wean. 2. Acute on chronic systolic heart failure diastolic heart failure monitor fluid status diuretics as necessary 3. Severe COPD supportive care medical management nebulizers as necessary 4. Intracerebral hemorrhage no change in his nonverbal 5. Tracheal hemorrhage no active bleeding is noted   I have personally seen and evaluated the patient, evaluated laboratory and imaging results, formulated the  assessment and plan and placed orders. The Patient requires high complexity decision making with multiple systems involvement.  Rounds were done with the Respiratory Therapy Director and Staff therapists and discussed with nursing staff also.  Allyne Gee, MD [facility] Pulmonary Critical Care Medicine Sleep Medicine

## 2019-11-12 ENCOUNTER — Other Ambulatory Visit (HOSPITAL_COMMUNITY): Payer: Self-pay

## 2019-11-12 LAB — BLOOD GAS, ARTERIAL
Acid-base deficit: 3.1 mmol/L — ABNORMAL HIGH (ref 0.0–2.0)
Acid-base deficit: 0.6 mmol/L (ref 0.0–2.0)
Bicarbonate: 29.5 mmol/L — ABNORMAL HIGH (ref 20.0–28.0)
Bicarbonate: 25.5 mmol/L (ref 20.0–28.0)
FIO2: 55
FIO2: 50
O2 Saturation: 97.7 %
O2 Saturation: 96.1 %
Patient temperature: 37.3
Patient temperature: 36.1
pCO2 arterial: >120 mmHg (ref 32.0–48.0)
pCO2 arterial: 55.9 mmHg — ABNORMAL HIGH (ref 32.0–48.0)
pH, Arterial: 6.895 — CL (ref 7.350–7.450)
pH, Arterial: 7.276 — ABNORMAL LOW (ref 7.350–7.450)
pO2, Arterial: 163 mmHg — ABNORMAL HIGH (ref 83.0–108.0)
pO2, Arterial: 80.2 mmHg — ABNORMAL LOW (ref 83.0–108.0)

## 2019-11-12 LAB — CBC
HCT: 34.6 % — ABNORMAL LOW (ref 36.0–46.0)
Hemoglobin: 9.7 g/dL — ABNORMAL LOW (ref 12.0–15.0)
MCH: 26.4 pg (ref 26.0–34.0)
MCHC: 28 g/dL — ABNORMAL LOW (ref 30.0–36.0)
MCV: 94 fL (ref 80.0–100.0)
Platelets: 399 10*3/uL (ref 150–400)
RBC: 3.68 MIL/uL — ABNORMAL LOW (ref 3.87–5.11)
RDW: 17.4 % — ABNORMAL HIGH (ref 11.5–15.5)
WBC: 8.3 10*3/uL (ref 4.0–10.5)
nRBC: 0 % (ref 0.0–0.2)

## 2019-11-12 LAB — BASIC METABOLIC PANEL
Anion gap: 10 (ref 5–15)
BUN: 47 mg/dL — ABNORMAL HIGH (ref 8–23)
CO2: 27 mmol/L (ref 22–32)
Calcium: 10.8 mg/dL — ABNORMAL HIGH (ref 8.9–10.3)
Chloride: 104 mmol/L (ref 98–111)
Creatinine, Ser: 0.91 mg/dL (ref 0.44–1.00)
GFR calc Af Amer: >60 mL/min (ref >60)
GFR calc non Af Amer: >60 mL/min (ref >60)
Glucose, Bld: 208 mg/dL — ABNORMAL HIGH (ref 70–99)
Potassium: 4.7 mmol/L (ref 3.5–5.1)
Sodium: 141 mmol/L (ref 135–145)

## 2019-11-12 LAB — MAGNESIUM: Magnesium: 2.3 mg/dL (ref 1.7–2.4)

## 2019-11-12 MED ORDER — IOPAMIDOL (ISOVUE-300) INJECTION 61%: 50.0000 mL | Freq: Once | INTRAVENOUS | Status: DC | PRN

## 2019-11-12 NOTE — Progress Notes (Signed)
Pulmonary Critical Care Medicine [place]   PULMONARY CRITICAL CARE SERVICE  PROGRESS NOTE  Date of Service: 11/12/2019  Chelsea Allison  SKA:[id number]  DOB: Oct 19, 1955   DOA: 12/05/2019  Referring Physician: Merton Border, MD  HPI: Chelsea Allison is a 64 y.o. female seen for follow up of Acute on Chronic Respiratory Failure.  Patient had a rapid response earlier today limited code.  She is not right now on the ventilator and full support on assist control mode  Medications: Reviewed on Rounds  Physical Exam:  Vitals: Temperature is 96.1 pulse 78 respiratory 22 blood pressure 113/77 saturations 100%  Ventilator Settings on assist control FiO2 35% tidal line 500 PEEP 5   General: Comfortable at this time  Eyes: Grossly normal lids, irises & conjunctiva  ENT: grossly tongue is normal  Neck: no obvious mass  Cardiovascular: S1 S2 normal no gallop  Respiratory: Coarse breath sounds with few scattered rhonchi  Abdomen: soft  Skin: no rash seen on limited exam  Musculoskeletal: not rigid  Psychiatric:unable to assess  Neurologic: no seizure no involuntary movements         Lab Data:   Basic Metabolic Panel: Recent Labs  Lab 11/06/19 0820 11/08/19 1533 11/08/2019 1240 11/10/19 1040 11/12/19 0752  NA 141 137 138 135 141  K 3.6 4.0 3.0* 4.2 4.7  CL 103  --  104 100 104  CO2 27  --  24 25 27   GLUCOSE 122*  --  117* 218* 208*  BUN 39*  --  33* 35* 47*  CREATININE 0.74  --  0.68 0.78 0.91  CALCIUM 9.6  --  9.8 10.0 10.8*  MG  --   --   --   --  2.3    ABG: Recent Labs  Lab 11/08/19 1533 11/26/2019 1859 11/10/19 0825  PHART 7.203* 7.372 7.349*  PCO2ART 75.3* 46.1 51.5*  PO2ART 410* 94.3 132*  HCO3 29.7* 26.1 27.7  O2SAT 100.0 97.5 98.9    Liver Function Tests: Recent Labs  Lab 11/10/19 1040  AST 25  ALT 29  ALKPHOS 116  BILITOT 0.5  PROT 6.5  ALBUMIN 2.9*   No results for input(s): LIPASE, AMYLASE in the last 168 hours. No  results for input(s): AMMONIA in the last 168 hours.  CBC: Recent Labs  Lab 11/06/19 0820 11/08/19 1533 11/13/2019 1240 11/10/19 1040 11/12/19 0752  WBC 11.9*  --  15.1* 12.2* 8.3  NEUTROABS  --   --   --  11.6*  --   HGB 8.4* 11.6* 8.3* 9.2* 9.7*  HCT 30.0* 34.0* 29.1* 31.8* 34.6*  MCV 94.6  --  91.8 93.0 94.0  PLT 428*  --  337 467* 399    Cardiac Enzymes: No results for input(s): CKTOTAL, CKMB, CKMBINDEX, TROPONINI in the last 168 hours.  BNP (last 3 results) Recent Labs    08/13/19 0225 09/05/19 0735 10/10/19 1120  BNP 929.4* 2,477.5* 973.2*    ProBNP (last 3 results) No results for input(s): PROBNP in the last 8760 hours.  Radiological Exams: No results found.  Assessment/Plan Active Problems:   Acute combined systolic and diastolic heart failure (HCC)   Acute on chronic respiratory failure with hypoxia (HCC)   COPD, severe (HCC)   Intracerebral hemorrhage   Tracheal hemorrhage (HCC)   1. Acute on chronic respiratory failure hypoxia plan is to continue with full support on the ventilator.  Continue secretion management supportive care 2. Combined systolic diastolic heart failure right now is patient  has tenuous status need close monitoring fluid 3. Severe COPD medical management 4. Intracerebral hemorrhage no change we will continue with supportive care 5. Tracheal hemorrhage at baseline we will continue to follow   I have personally seen and evaluated the patient, evaluated laboratory and imaging results, formulated the assessment and plan and placed orders. The Patient requires high complexity decision making with multiple systems involvement.  Rounds were done with the Respiratory Therapy Director and Staff therapists and discussed with nursing staff also.  Allyne Gee, MD [facility] Pulmonary Critical Care Medicine Sleep Medicine

## 2019-11-13 ENCOUNTER — Other Ambulatory Visit (HOSPITAL_COMMUNITY): Payer: Self-pay

## 2019-11-13 LAB — BASIC METABOLIC PANEL
Anion gap: 9 (ref 5–15)
BUN: 51 mg/dL — ABNORMAL HIGH (ref 8–23)
CO2: 29 mmol/L (ref 22–32)
Calcium: 10.4 mg/dL — ABNORMAL HIGH (ref 8.9–10.3)
Chloride: 105 mmol/L (ref 98–111)
Creatinine, Ser: 1.13 mg/dL — ABNORMAL HIGH (ref 0.44–1.00)
GFR calc Af Amer: 60 mL/min — ABNORMAL LOW (ref >60)
GFR calc non Af Amer: 52 mL/min — ABNORMAL LOW (ref >60)
Glucose, Bld: 165 mg/dL — ABNORMAL HIGH (ref 70–99)
Potassium: 4.5 mmol/L (ref 3.5–5.1)
Sodium: 143 mmol/L (ref 135–145)

## 2019-11-13 LAB — CBC
HCT: 36.8 % (ref 36.0–46.0)
Hemoglobin: 10.1 g/dL — ABNORMAL LOW (ref 12.0–15.0)
MCH: 25.7 pg — ABNORMAL LOW (ref 26.0–34.0)
MCHC: 27.4 g/dL — ABNORMAL LOW (ref 30.0–36.0)
MCV: 93.6 fL (ref 80.0–100.0)
Platelets: 249 10*3/uL (ref 150–400)
RBC: 3.93 MIL/uL (ref 3.87–5.11)
RDW: 17.5 % — ABNORMAL HIGH (ref 11.5–15.5)
WBC: 12.8 10*3/uL — ABNORMAL HIGH (ref 4.0–10.5)
nRBC: 0 % (ref 0.0–0.2)

## 2019-11-13 NOTE — Progress Notes (Signed)
Patient seen at bedside at the request of the [facility] Team regarding Patient's leaking gastrostomy tube. Patient has a 20 Fr pull through gastrostomy tube placed by IR  on 8.13.21 by Dr. Pilar Jarvis. At the time of the assessment the gastrostomy tube was connected to D5 with no issues. Dressings are clean dry and intact. No leaking noted.DG Abdomen Peg performed on 9.5.21 shows the gastrostomy tube in good position. Due to patient's anatomy and positioning some leaking is anticipated.  Recommend Team slow down rate of tube feeds and reposition patient so she is not supine during feeds. These recommendations where given to the RN at bedside. Gastrostomy tube is okay to use per IR perspective.

## 2019-11-13 NOTE — Progress Notes (Signed)
Pulmonary Critical Care Medicine [place]   PULMONARY CRITICAL CARE SERVICE  PROGRESS NOTE  Date of Service: 11/13/2019  Chelsea Allison  VWU:[id number]  DOB: Jan 14, 1956   DOA: 11/27/2019  Referring Physician: Merton Border, MD  HPI: Chelsea Allison is a 64 y.o. female seen for follow up of Acute on Chronic Respiratory Failure.  Patient had change in status yesterday went down for CT scan which did not reveal any new bleed but still has a significant hematoma and changes of intraparenchymal bleed along with midline shift  Medications: Reviewed on Rounds  Physical Exam:  Vitals: Temperature is 96.9 pulse 87 respiratory rate 30 blood pressure is 151/70 saturations 100%  Ventilator Settings on assist control FiO2 is 50% tidal volume 350 PEEP 7  . General: Comfortable at this time . Eyes: Grossly normal lids, irises & conjunctiva . ENT: grossly tongue is normal . Neck: no obvious mass . Cardiovascular: S1 S2 normal no gallop . Respiratory: No rhonchi no rales noted at this time . Abdomen: soft . Skin: no rash seen on limited exam . Musculoskeletal: not rigid . Psychiatric:unable to assess . Neurologic: no seizure no involuntary movements         Lab Data:   Basic Metabolic Panel: Recent Labs  Lab 11/08/19 1533 11/17/2019 1240 11/10/19 1040 11/12/19 0752 11/13/19 0609  NA 137 138 135 141 143  K 4.0 3.0* 4.2 4.7 4.5  CL  --  104 100 104 105  CO2  --  24 25 27 29   GLUCOSE  --  117* 218* 208* 165*  BUN  --  33* 35* 47* 51*  CREATININE  --  0.68 0.78 0.91 1.13*  CALCIUM  --  9.8 10.0 10.8* 10.4*  MG  --   --   --  2.3  --     ABG: Recent Labs  Lab 11/08/19 1533 11/17/2019 1859 11/10/19 0825 11/12/19 1105 11/12/19 1500  PHART 7.203* 7.372 7.349* 6.895* 7.276*  PCO2ART 75.3* 46.1 51.5* >120.0* 55.9*  PO2ART 410* 94.3 132* 163* 80.2*  HCO3 29.7* 26.1 27.7 29.5* 25.5  O2SAT 100.0 97.5 98.9 97.7 96.1    Liver Function Tests: Recent Labs  Lab  11/10/19 1040  AST 25  ALT 29  ALKPHOS 116  BILITOT 0.5  PROT 6.5  ALBUMIN 2.9*   No results for input(s): LIPASE, AMYLASE in the last 168 hours. No results for input(s): AMMONIA in the last 168 hours.  CBC: Recent Labs  Lab 11/08/19 1533 11/24/2019 1240 11/10/19 1040 11/12/19 0752 11/13/19 0609  WBC  --  15.1* 12.2* 8.3 12.8*  NEUTROABS  --   --  11.6*  --   --   HGB 11.6* 8.3* 9.2* 9.7* 10.1*  HCT 34.0* 29.1* 31.8* 34.6* 36.8  MCV  --  91.8 93.0 94.0 93.6  PLT  --  337 467* 399 249    Cardiac Enzymes: No results for input(s): CKTOTAL, CKMB, CKMBINDEX, TROPONINI in the last 168 hours.  BNP (last 3 results) Recent Labs    08/13/19 0225 09/05/19 0735 10/10/19 1120  BNP 929.4* 2,477.5* 973.2*    ProBNP (last 3 results) No results for input(s): PROBNP in the last 8760 hours.  Radiological Exams: CT HEAD WO CONTRAST  Result Date: 11/13/2019 CLINICAL DATA:  Encephalopathy.  Intracranial hemorrhage. EXAM: CT HEAD WITHOUT CONTRAST TECHNIQUE: Contiguous axial images were obtained from the base of the skull through the vertex without intravenous contrast. COMPARISON:  Head CT 10/31/2019 FINDINGS: Brain: Fading density of left basal  ganglia intraparenchymal hematoma. Size is unchanged. Surrounding edema is also unchanged. There is rightward midline shift at the level of the thalamus measuring 4 mm, unchanged. No new hemorrhage. Vascular: No hyperdense vessel or unexpected calcification. Skull: Normal. Negative for fracture or focal lesion. Sinuses/Orbits: No acute finding. Other: None. IMPRESSION: 1. Unchanged size of left basal ganglia intraparenchymal hematoma with surrounding edema and 4 mm rightward midline shift. 2. No new hemorrhage. Electronically Signed   By: Ulyses Jarred M.D.   On: 11/13/2019 02:49   DG ABDOMEN PEG TUBE LOCATION  Result Date: 11/12/2019 CLINICAL DATA:  Gastrostomy tube adjustment. EXAM: ABDOMEN - 1 VIEW COMPARISON:  Plain film of the abdomen dated  11/10/2019. FINDINGS: Water-soluble contrast was administered via the indwelling PEG tube. Contrast appears to be contained within the stomach. No dilated bowel loops appreciated. IMPRESSION: Peg tube appears to be appropriately positioned within the stomach. No extraluminal contrast identified. Electronically Signed   By: Franki Cabot M.D.   On: 11/12/2019 14:41   DG CHEST PORT 1 VIEW  Result Date: 11/12/2019 CLINICAL DATA:  Respiratory failure EXAM: PORTABLE CHEST 1 VIEW COMPARISON:  Prior chest x-ray 11/14/2019 FINDINGS: Stable cardiac and mediastinal contours with cardiomegaly and left heart enlargement. Atherosclerotic calcifications present in the transverse aorta. Surgical changes in the right suprahilar region remains unchanged. Tracheostomy tube in good position with the tip midline and at the level of the clavicles. Developing interstitial airspace opacities in the left upper lung. Chronic elevation of the right hemidiaphragm with juxta phrenic peaking likely secondary to prior right upper lobectomy. Stable left basilar opacity likely representing a small pleural effusion and associated atelectasis. Coronary stent projects over the left heart. No pneumothorax. No acute osseous abnormality. IMPRESSION: 1. Developing patchy interstitial airspace opacities in the left upper lung concerning for developing bronchopneumonia. 2. Unchanged appearance of the right lung. No evidence of recurrent right lower lobar atelectasis. 3. Persistent small layering left pleural effusion with associated left basilar atelectasis versus infiltrate. 4. Stable and satisfactory position of tracheostomy tube. Electronically Signed   By: Jacqulynn Cadet M.D.   On: 11/12/2019 11:04    Assessment/Plan Active Problems:   Acute combined systolic and diastolic heart failure (HCC)   Acute on chronic respiratory failure with hypoxia (HCC)   COPD, severe (HCC)   Intracerebral hemorrhage   Tracheal hemorrhage ([place])   1. Acute  on chronic respiratory failure hypoxia we will continue with full support on assist control not a candidate for weaning right now discussed on rounds with respiratory therapy. 2. Acute combined systolic diastolic heart failure supportive care monitor fluid status. 3. Intracerebral hemorrhage no changes noted on the latest CT we will continue with supportive care overall prognosis is quite poor may need to discuss goals of care with family 4. Severe COPD medical management latest chest x-ray still showing some patchy and interstitial infiltrates concerning for pneumonitis may need to go back on antibiotics. 5. Tracheal hemorrhage no sign of active bleeding right now   I have personally seen and evaluated the patient, evaluated laboratory and imaging results, formulated the assessment and plan and placed orders. The Patient requires high complexity decision making with multiple systems involvement.  Rounds were done with the Respiratory Therapy Director and Staff therapists and discussed with nursing staff also.  Time 35 minutes  Allyne Gee, MD [facility] Pulmonary Critical Care Medicine Sleep Medicine

## 2019-11-14 NOTE — Progress Notes (Addendum)
Pulmonary Critical Care Medicine [name]   PULMONARY CRITICAL CARE SERVICE  PROGRESS NOTE  Date of Service: 11/14/2019  Chelsea Allison  [id number]:[id number]  DOB: September 05, 1955   DOA: 12/03/2019  Referring Physician: Merton Border, MD  HPI: Chelsea Allison is a 64 y.o. female seen for follow up of Acute on Chronic Respiratory Failure. Patient continues on full support at this time.  Assist control mode rate of 28, fio2 50%  Medications: Reviewed on Rounds  Physical Exam:  Vitals: pulse 78, resp 28, bp 110/68, sat 100% temp 96.3  Ventilator Settings ACVC rate 28, TV 350, peep 7, fio2 50%  . General: Comfortable at this time . Eyes: Grossly normal lids, irises & conjunctiva . ENT: grossly tongue is normal . Neck: no obvious mass . Cardiovascular: S1 S2 normal no gallop . Respiratory: course breath sounds . Abdomen: soft . Skin: no rash seen on limited exam . Musculoskeletal: not rigid . Psychiatric:unable to assess . Neurologic: no seizure no involuntary movements         Lab Data:   Basic Metabolic Panel: Recent Labs  Lab 11/08/19 1533 11/15/2019 1240 11/10/19 1040 11/12/19 0752 11/13/19 0609  NA 137 138 135 141 143  K 4.0 3.0* 4.2 4.7 4.5  CL  --  104 100 104 105  CO2  --  24 25 27 29   GLUCOSE  --  117* 218* 208* 165*  BUN  --  33* 35* 47* 51*  CREATININE  --  0.68 0.78 0.91 1.13*  CALCIUM  --  9.8 10.0 10.8* 10.4*  MG  --   --   --  2.3  --     ABG: Recent Labs  Lab 11/08/19 1533 11/22/2019 1859 11/10/19 0825 11/12/19 1105 11/12/19 1500  PHART 7.203* 7.372 7.349* 6.895* 7.276*  PCO2ART 75.3* 46.1 51.5* >120.0* 55.9*  PO2ART 410* 94.3 132* 163* 80.2*  HCO3 29.7* 26.1 27.7 29.5* 25.5  O2SAT 100.0 97.5 98.9 97.7 96.1    Liver Function Tests: Recent Labs  Lab 11/10/19 1040  AST 25  ALT 29  ALKPHOS 116  BILITOT 0.5  PROT 6.5  ALBUMIN 2.9*   No results for input(s): LIPASE, AMYLASE in the last 168 hours. No results for input(s): AMMONIA  in the last 168 hours.  CBC: Recent Labs  Lab 11/08/19 1533 11/17/2019 1240 11/10/19 1040 11/12/19 0752 11/13/19 0609  WBC  --  15.1* 12.2* 8.3 12.8*  NEUTROABS  --   --  11.6*  --   --   HGB 11.6* 8.3* 9.2* 9.7* 10.1*  HCT 34.0* 29.1* 31.8* 34.6* 36.8  MCV  --  91.8 93.0 94.0 93.6  PLT  --  337 467* 399 249    Cardiac Enzymes: No results for input(s): CKTOTAL, CKMB, CKMBINDEX, TROPONINI in the last 168 hours.  BNP (last 3 results) Recent Labs    08/13/19 0225 09/05/19 0735 10/10/19 1120  BNP 929.4* 2,477.5* 973.2*    ProBNP (last 3 results) No results for input(s): PROBNP in the last 8760 hours.  Radiological Exams: CT HEAD WO CONTRAST  Result Date: 11/13/2019 CLINICAL DATA:  Encephalopathy.  Intracranial hemorrhage. EXAM: CT HEAD WITHOUT CONTRAST TECHNIQUE: Contiguous axial images were obtained from the base of the skull through the vertex without intravenous contrast. COMPARISON:  Head CT 10/31/2019 FINDINGS: Brain: Fading density of left basal ganglia intraparenchymal hematoma. Size is unchanged. Surrounding edema is also unchanged. There is rightward midline shift at the level of the thalamus measuring 4 mm, unchanged. No new  hemorrhage. Vascular: No hyperdense vessel or unexpected calcification. Skull: Normal. Negative for fracture or focal lesion. Sinuses/Orbits: No acute finding. Other: None. IMPRESSION: 1. Unchanged size of left basal ganglia intraparenchymal hematoma with surrounding edema and 4 mm rightward midline shift. 2. No new hemorrhage. Electronically Signed   By: Ulyses Jarred M.D.   On: 11/13/2019 02:49   DG ABDOMEN PEG TUBE LOCATION  Result Date: 11/12/2019 CLINICAL DATA:  Gastrostomy tube adjustment. EXAM: ABDOMEN - 1 VIEW COMPARISON:  Plain film of the abdomen dated 11/10/2019. FINDINGS: Water-soluble contrast was administered via the indwelling PEG tube. Contrast appears to be contained within the stomach. No dilated bowel loops appreciated. IMPRESSION: Peg  tube appears to be appropriately positioned within the stomach. No extraluminal contrast identified. Electronically Signed   By: Franki Cabot M.D.   On: 11/12/2019 14:41    Assessment/Plan Active Problems:   Acute combined systolic and diastolic heart failure (HCC)   Acute on chronic respiratory failure with hypoxia (HCC)   COPD, severe (HCC)   Intracerebral hemorrhage   Tracheal hemorrhage ([place])   1. Acute on chronic respiratory failure hypoxia we will continue with full support on assist control not a candidate for weaning right continue supportive measures.  2. Acute combined systolic diastolic heart failure supportive care monitor fluid status. 3. Intracerebral hemorrhage no changes noted on the latest CT we will continue with supportive care overall prognosis is quite poor may need to discuss goals of care with family 4. Severe COPD medical management latest chest x-ray still showing some patchy and interstitial infiltrates concerning for pneumonitis may need to go back on antibiotics. 5. Tracheal hemorrhage no sign of active bleeding right now   I have personally seen and evaluated the patient, evaluated laboratory and imaging results, formulated the assessment and plan and placed orders. The Patient requires high complexity decision making with multiple systems involvement.  Rounds were done with the Respiratory Therapy Director and Staff therapists and discussed with nursing staff also.  Allyne Gee, MD [facility] Pulmonary Critical Care Medicine Sleep Medicine

## 2019-11-15 LAB — CBC
HCT: 42.6 % (ref 36.0–46.0)
Hemoglobin: 11.6 g/dL — ABNORMAL LOW (ref 12.0–15.0)
MCH: 26.1 pg (ref 26.0–34.0)
MCHC: 27.2 g/dL — ABNORMAL LOW (ref 30.0–36.0)
MCV: 95.7 fL (ref 80.0–100.0)
Platelets: 355 10*3/uL (ref 150–400)
RBC: 4.45 MIL/uL (ref 3.87–5.11)
RDW: 17.5 % — ABNORMAL HIGH (ref 11.5–15.5)
WBC: 19.2 10*3/uL — ABNORMAL HIGH (ref 4.0–10.5)
nRBC: 0.3 % — ABNORMAL HIGH (ref 0.0–0.2)

## 2019-11-15 LAB — BASIC METABOLIC PANEL
Anion gap: 10 (ref 5–15)
BUN: 69 mg/dL — ABNORMAL HIGH (ref 8–23)
CO2: 28 mmol/L (ref 22–32)
Calcium: 10.6 mg/dL — ABNORMAL HIGH (ref 8.9–10.3)
Chloride: 104 mmol/L (ref 98–111)
Creatinine, Ser: 0.99 mg/dL (ref 0.44–1.00)
GFR calc Af Amer: >60 mL/min (ref >60)
GFR calc non Af Amer: >60 mL/min (ref >60)
Glucose, Bld: 271 mg/dL — ABNORMAL HIGH (ref 70–99)
Potassium: 5.4 mmol/L — ABNORMAL HIGH (ref 3.5–5.1)
Sodium: 142 mmol/L (ref 135–145)

## 2019-11-15 LAB — CULTURE, RESPIRATORY W GRAM STAIN: Culture: NORMAL

## 2019-11-15 LAB — VANCOMYCIN, TROUGH: Vancomycin Tr: 60 ug/mL (ref 15–20)

## 2019-11-15 NOTE — Progress Notes (Addendum)
Pulmonary Critical Care Medicine [place]   PULMONARY CRITICAL CARE SERVICE  PROGRESS NOTE  Date of Service: 11/15/2019  Nelson Julson  [id number]  DOB: 1955/10/29   DOA: 12/04/2019  Referring Physician: Merton Border, MD  HPI: Chelsea Allison is a 64 y.o. female seen for follow up of Acute on Chronic Respiratory Failure. Patient continue on full support at this time rate of 28 and fio2 50%.   Medications: Reviewed on Rounds  Physical Exam:  Vitals: Pulse 84, resp 24, bp 146/79, sat 100%, temp 96.6  Ventilator Settings vent mode ACVC rate of 28, TV 350, peep 7, fio2 50%.   . General: Comfortable at this time . Eyes: Grossly normal lids, irises & conjunctiva . ENT: grossly tongue is normal . Neck: no obvious mass . Cardiovascular: S1 S2 normal no gallop . Respiratory: no rales or ronchi noted.  . Abdomen: soft . Skin: no rash seen on limited exam . Musculoskeletal: not rigid . Psychiatric:unable to assess . Neurologic: no seizure no involuntary movements         Lab Data:   Basic Metabolic Panel: Recent Labs  Lab 11/15/2019 1240 11/10/19 1040 11/12/19 0752 11/13/19 0609 11/15/19 0920  NA 138 135 141 143 142  K 3.0* 4.2 4.7 4.5 5.4*  CL 104 100 104 105 104  CO2 24 25 27 29 28   GLUCOSE 117* 218* 208* 165* 271*  BUN 33* 35* 47* 51* 69*  CREATININE 0.68 0.78 0.91 1.13* 0.99  CALCIUM 9.8 10.0 10.8* 10.4* 10.6*  MG  --   --  2.3  --   --     ABG: Recent Labs  Lab 11/08/19 1533 11/27/2019 1859 11/10/19 0825 11/12/19 1105 11/12/19 1500  PHART 7.203* 7.372 7.349* 6.895* 7.276*  PCO2ART 75.3* 46.1 51.5* >120.0* 55.9*  PO2ART 410* 94.3 132* 163* 80.2*  HCO3 29.7* 26.1 27.7 29.5* 25.5  O2SAT 100.0 97.5 98.9 97.7 96.1    Liver Function Tests: Recent Labs  Lab 11/10/19 1040  AST 25  ALT 29  ALKPHOS 116  BILITOT 0.5  PROT 6.5  ALBUMIN 2.9*   No results for input(s): LIPASE, AMYLASE in the last 168 hours. No results for input(s): AMMONIA  in the last 168 hours.  CBC: Recent Labs  Lab 12/01/2019 1240 11/10/19 1040 11/12/19 0752 11/13/19 0609 11/15/19 0920  WBC 15.1* 12.2* 8.3 12.8* 19.2*  NEUTROABS  --  11.6*  --   --   --   HGB 8.3* 9.2* 9.7* 10.1* 11.6*  HCT 29.1* 31.8* 34.6* 36.8 42.6  MCV 91.8 93.0 94.0 93.6 95.7  PLT 337 467* 399 249 355    Cardiac Enzymes: No results for input(s): CKTOTAL, CKMB, CKMBINDEX, TROPONINI in the last 168 hours.  BNP (last 3 results) Recent Labs    08/13/19 0225 09/05/19 0735 10/10/19 1120  BNP 929.4* 2,477.5* 973.2*    ProBNP (last 3 results) No results for input(s): PROBNP in the last 8760 hours.  Radiological Exams: No results found.  Assessment/Plan Active Problems:   Acute combined systolic and diastolic heart failure (HCC)   Acute on chronic respiratory failure with hypoxia (HCC)   COPD, severe (HCC)   Intracerebral hemorrhage   Tracheal hemorrhage (HCC)   1. Acute on chronic respiratory failure hypoxia we will continue with full support on assist control not a candidate for weaning right continue supportive measures.  2. Acute combined systolic diastolic heart failure supportive care monitor fluid status. 3. Intracerebral hemorrhage no changes noted on the latest CT  we will continue with supportive care overall prognosis is quite poor may need to discuss goals of care with family 4. Severe COPD medical management latest chest x-ray still showing some patchy and interstitial infiltrates concerning for pneumonitis may need to go back on antibiotics. 5. Tracheal hemorrhage no sign of active bleeding right now   I have personally seen and evaluated the patient, evaluated laboratory and imaging results, formulated the assessment and plan and placed orders. The Patient requires high complexity decision making with multiple systems involvement.  Rounds were done with the Respiratory Therapy Director and Staff therapists and discussed with nursing staff also.  Allyne Gee, MD [facility] Pulmonary Critical Care Medicine Sleep Medicine

## 2019-11-16 ENCOUNTER — Other Ambulatory Visit (HOSPITAL_COMMUNITY): Payer: Self-pay

## 2019-11-16 LAB — CBC
HCT: 34.9 % — ABNORMAL LOW (ref 36.0–46.0)
Hemoglobin: 9.6 g/dL — ABNORMAL LOW (ref 12.0–15.0)
MCH: 25.7 pg — ABNORMAL LOW (ref 26.0–34.0)
MCHC: 27.5 g/dL — ABNORMAL LOW (ref 30.0–36.0)
MCV: 93.6 fL (ref 80.0–100.0)
Platelets: 140 10*3/uL — ABNORMAL LOW (ref 150–400)
RBC: 3.73 MIL/uL — ABNORMAL LOW (ref 3.87–5.11)
RDW: 17.5 % — ABNORMAL HIGH (ref 11.5–15.5)
WBC: 10 10*3/uL (ref 4.0–10.5)
nRBC: 0.3 % — ABNORMAL HIGH (ref 0.0–0.2)

## 2019-11-16 LAB — VANCOMYCIN, TROUGH: Vancomycin Tr: 19 ug/mL (ref 15–20)

## 2019-11-16 LAB — BASIC METABOLIC PANEL
Anion gap: 10 (ref 5–15)
BUN: 86 mg/dL — ABNORMAL HIGH (ref 8–23)
CO2: 25 mmol/L (ref 22–32)
Calcium: 10.3 mg/dL (ref 8.9–10.3)
Chloride: 107 mmol/L (ref 98–111)
Creatinine, Ser: 1.11 mg/dL — ABNORMAL HIGH (ref 0.44–1.00)
GFR calc Af Amer: >60 mL/min (ref >60)
GFR calc non Af Amer: 53 mL/min — ABNORMAL LOW (ref >60)
Glucose, Bld: 294 mg/dL — ABNORMAL HIGH (ref 70–99)
Potassium: 4.8 mmol/L (ref 3.5–5.1)
Sodium: 142 mmol/L (ref 135–145)

## 2019-11-16 NOTE — Progress Notes (Signed)
Pulmonary Critical Care Medicine [place]   PULMONARY CRITICAL CARE SERVICE  PROGRESS NOTE  Date of Service: 11/16/2019  Victoriah Wilds  FUX:[id number]  DOB: [birth year]   DOA: 11/17/2019  Referring Physician: Merton Border, MD  HPI: Chelsea Allison is a 64 y.o. female seen for follow up of Acute on Chronic Respiratory Failure.  Patient currently is on assist control mode has been on 45% FiO2 not tolerating weaning so far today  Medications: Reviewed on Rounds  Physical Exam:  Vitals: Temperature is 96.6 pulse 96 respiratory rate is 28 blood pressure is 134/69 saturations 100%  Ventilator Settings on assist control FiO2 45% tidal volume 350 PEEP 7  . General: Comfortable at this time . Eyes: Grossly normal lids, irises & conjunctiva . ENT: grossly tongue is normal . Neck: no obvious mass . Cardiovascular: S1 S2 normal no gallop . Respiratory: No rhonchi very coarse breath sounds . Abdomen: soft . Skin: no rash seen on limited exam . Musculoskeletal: not rigid . Psychiatric:unable to assess . Neurologic: no seizure no involuntary movements         Lab Data:   Basic Metabolic Panel: Recent Labs  Lab 12/04/2019 1240 11/10/19 1040 11/12/19 0752 11/13/19 0609 11/15/19 0920  NA 138 135 141 143 142  K 3.0* 4.2 4.7 4.5 5.4*  CL 104 100 104 105 104  CO2 24 25 27 29 28   GLUCOSE 117* 218* 208* 165* 271*  BUN 33* 35* 47* 51* 69*  CREATININE 0.68 0.78 0.91 1.13* 0.99  CALCIUM 9.8 10.0 10.8* 10.4* 10.6*  MG  --   --  2.3  --   --     ABG: Recent Labs  Lab 11/15/2019 1859 11/10/19 0825 11/12/19 1105 11/12/19 1500  PHART 7.372 7.349* 6.895* 7.276*  PCO2ART 46.1 51.5* >120.0* 55.9*  PO2ART 94.3 132* 163* 80.2*  HCO3 26.1 27.7 29.5* 25.5  O2SAT 97.5 98.9 97.7 96.1    Liver Function Tests: Recent Labs  Lab 11/10/19 1040  AST 25  ALT 29  ALKPHOS 116  BILITOT 0.5  PROT 6.5  ALBUMIN 2.9*   No results for input(s): LIPASE, AMYLASE in the last 168  hours. No results for input(s): AMMONIA in the last 168 hours.  CBC: Recent Labs  Lab 11/08/2019 1240 11/10/19 1040 11/12/19 0752 11/13/19 0609 11/15/19 0920  WBC 15.1* 12.2* 8.3 12.8* 19.2*  NEUTROABS  --  11.6*  --   --   --   HGB 8.3* 9.2* 9.7* 10.1* 11.6*  HCT 29.1* 31.8* 34.6* 36.8 42.6  MCV 91.8 93.0 94.0 93.6 95.7  PLT 337 467* 399 249 355    Cardiac Enzymes: No results for input(s): CKTOTAL, CKMB, CKMBINDEX, TROPONINI in the last 168 hours.  BNP (last 3 results) Recent Labs    08/13/19 0225 09/05/19 0735 10/10/19 1120  BNP 929.4* 2,477.5* 973.2*    ProBNP (last 3 results) No results for input(s): PROBNP in the last 8760 hours.  Radiological Exams: DG Chest Port 1 View  Result Date: 11/16/2019 CLINICAL DATA:  Respiratory failure EXAM: PORTABLE CHEST 1 VIEW COMPARISON:  None 521 FINDINGS: Tracheostomy tube unchanged in position. Shallow inspiration. Heart size and pulmonary vascularity are normal for technique. Perihilar infiltrates with mild vascular congestion. Scarring and volume loss in the right hilum and right upper lung with surgical clips likely representing treated cancer. Small right pleural effusion. Calcification of the aorta. IMPRESSION: Shallow inspiration. Perihilar infiltrates and mild vascular congestion. Small right pleural effusion. Postoperative changes on the right. Electronically  Signed   By: Lucienne Capers M.D.   On: 11/16/2019 06:37    Assessment/Plan Active Problems:   Acute combined systolic and diastolic heart failure (HCC)   Acute on chronic respiratory failure with hypoxia (HCC)   COPD, severe (HCC)   Intracerebral hemorrhage   Tracheal hemorrhage ([place])   1. Acute on chronic respiratory failure hypoxia we will continue with full support on the vent patient right now is on 45% FiO2.  Respiratory therapy has been assessing wean readiness and patient is not able to tolerate weaning. 2. Acute combined systolic diastolic heart failure no  change continue supportive care fluid management 3. Severe COPD at baseline medical management 4. Intracranial hemorrhage there is been no improvement suggest goals of care 5. Tracheal hemorrhage no active bleeding is noted   I have personally seen and evaluated the patient, evaluated laboratory and imaging results, formulated the assessment and plan and placed orders. The Patient requires high complexity decision making with multiple systems involvement.  Rounds were done with the Respiratory Therapy Director and Staff therapists and discussed with nursing staff also.  Allyne Gee, MD [facility] Pulmonary Critical Care Medicine Sleep Medicine

## 2019-11-17 NOTE — Progress Notes (Addendum)
Pulmonary Critical Care Medicine [place]   PULMONARY CRITICAL CARE SERVICE  PROGRESS NOTE  Date of Service: 11/17/2019  Erline Siddoway  OEV:[id number]  DOB: 21-Apr-1955   DOA: 11/10/2019  Referring Physician: Merton Border, MD  HPI: Chelsea Allison is a 64 y.o. female seen for follow up of Acute on Chronic Respiratory Failure.  Patient mains on full support on the ventilator at this time assist-control mode rate 28 and FiO2 45%  Medications: Reviewed on Rounds  Physical Exam:  Vitals: Pulse 96 respirations 30 BP 154/69 O2 sat 97% temp 99.0  Ventilator Settings ventilator mode AC VC plus rate of 28 tidal volume 350 PEEP of 7 FiO2 45%  . General: Comfortable at this time . Eyes: Grossly normal lids, irises & conjunctiva . ENT: grossly tongue is normal . Neck: no obvious mass . Cardiovascular: S1 S2 normal no gallop . Respiratory: No rales or rhonchi noted . Abdomen: soft . Skin: no rash seen on limited exam . Musculoskeletal: not rigid . Psychiatric:unable to assess . Neurologic: no seizure no involuntary movements         Lab Data:   Basic Metabolic Panel: Recent Labs  Lab 11/12/19 0752 11/13/19 0609 11/15/19 0920 11/16/19 1107  NA 141 143 142 142  K 4.7 4.5 5.4* 4.8  CL 104 105 104 107  CO2 27 29 28 25   GLUCOSE 208* 165* 271* 294*  BUN 47* 51* 69* 86*  CREATININE 0.91 1.13* 0.99 1.11*  CALCIUM 10.8* 10.4* 10.6* 10.3  MG 2.3  --   --   --     ABG: Recent Labs  Lab 11/12/19 1105 11/12/19 1500  PHART 6.895* 7.276*  PCO2ART >120.0* 55.9*  PO2ART 163* 80.2*  HCO3 29.5* 25.5  O2SAT 97.7 96.1    Liver Function Tests: No results for input(s): AST, ALT, ALKPHOS, BILITOT, PROT, ALBUMIN in the last 168 hours. No results for input(s): LIPASE, AMYLASE in the last 168 hours. No results for input(s): AMMONIA in the last 168 hours.  CBC: Recent Labs  Lab 11/12/19 0752 11/13/19 0609 11/15/19 0920 11/16/19 1107  WBC 8.3 12.8* 19.2* 10.0  HGB  9.7* 10.1* 11.6* 9.6*  HCT 34.6* 36.8 42.6 34.9*  MCV 94.0 93.6 95.7 93.6  PLT 399 249 355 140*    Cardiac Enzymes: No results for input(s): CKTOTAL, CKMB, CKMBINDEX, TROPONINI in the last 168 hours.  BNP (last 3 results) Recent Labs    08/13/19 0225 09/05/19 0735 10/10/19 1120  BNP 929.4* 2,477.5* 973.2*    ProBNP (last 3 results) No results for input(s): PROBNP in the last 8760 hours.  Radiological Exams: DG Chest Port 1 View  Result Date: 11/16/2019 CLINICAL DATA:  Respiratory failure EXAM: PORTABLE CHEST 1 VIEW COMPARISON:  None 521 FINDINGS: Tracheostomy tube unchanged in position. Shallow inspiration. Heart size and pulmonary vascularity are normal for technique. Perihilar infiltrates with mild vascular congestion. Scarring and volume loss in the right hilum and right upper lung with surgical clips likely representing treated cancer. Small right pleural effusion. Calcification of the aorta. IMPRESSION: Shallow inspiration. Perihilar infiltrates and mild vascular congestion. Small right pleural effusion. Postoperative changes on the right. Electronically Signed   By: Lucienne Capers M.D.   On: 11/16/2019 06:37    Assessment/Plan Active Problems:   Acute combined systolic and diastolic heart failure (HCC)   Acute on chronic respiratory failure with hypoxia (HCC)   COPD, severe (HCC)   Intracerebral hemorrhage   Tracheal hemorrhage ([place])   1. Acute on chronic  respiratory failure hypoxia we will continue on full support at this time currently on an FiO2 of 45% we will continue to wean as tolerated.  Continue aggressive pulmonary toilet supportive measures. 2. Acute combined systolic diastolic heart failure no change continue supportive care fluid management 3. Severe COPD at baseline medical management 4. Intracranial hemorrhage there is been no improvement suggest goals of care 5. Tracheal hemorrhage no active bleeding is noted   I have personally seen and evaluated the  patient, evaluated laboratory and imaging results, formulated the assessment and plan and placed orders. The Patient requires high complexity decision making with multiple systems involvement.  Rounds were done with the Respiratory Therapy Director and Staff therapists and discussed with nursing staff also.  Allyne Gee, MD [facility] Pulmonary Critical Care Medicine Sleep Medicine

## 2019-11-18 NOTE — Progress Notes (Addendum)
Pulmonary Critical Care Medicine [place]   PULMONARY CRITICAL CARE SERVICE  PROGRESS NOTE  Date of Service: 11/18/2019  Joyclyn Plazola  BTD:[id number]  DOB: [birth year]   DOA: 11/08/2019  Referring Physician: Merton Border, MD  HPI: Chelsea Allison is a 64 y.o. female seen for follow up of Acute on Chronic Respiratory Failure.  Patient is on the ventilator and full support.  After due consideration the family has opted to change the patient's status to comfort care and comfort measures.  Patient will be having withdrawal of ventilator today  Medications: Reviewed on Rounds  Physical Exam:  Vitals: Temperature 97.6 pulse 82 respiratory rate 30 blood pressure is 131/80 saturations 100%  Ventilator Settings on pressure assist control FiO2 is 45% tidal volume 350 PEEP 7  . General: Comfortable at this time . Eyes: Grossly normal lids, irises & conjunctiva . ENT: grossly tongue is normal . Neck: no obvious mass . Cardiovascular: S1 S2 normal no gallop . Respiratory: No rhonchi no rales . Abdomen: soft . Skin: no rash seen on limited exam . Musculoskeletal: not rigid . Psychiatric:unable to assess . Neurologic: no seizure no involuntary movements         Lab Data:   Basic Metabolic Panel: Recent Labs  Lab 11/12/19 0752 11/13/19 0609 11/15/19 0920 11/16/19 1107  NA 141 143 142 142  K 4.7 4.5 5.4* 4.8  CL 104 105 104 107  CO2 27 29 28 25   GLUCOSE 208* 165* 271* 294*  BUN 47* 51* 69* 86*  CREATININE 0.91 1.13* 0.99 1.11*  CALCIUM 10.8* 10.4* 10.6* 10.3  MG 2.3  --   --   --     ABG: Recent Labs  Lab 11/12/19 1105 11/12/19 1500  PHART 6.895* 7.276*  PCO2ART >120.0* 55.9*  PO2ART 163* 80.2*  HCO3 29.5* 25.5  O2SAT 97.7 96.1    Liver Function Tests: No results for input(s): AST, ALT, ALKPHOS, BILITOT, PROT, ALBUMIN in the last 168 hours. No results for input(s): LIPASE, AMYLASE in the last 168 hours. No results for input(s): AMMONIA in the last  168 hours.  CBC: Recent Labs  Lab 11/12/19 0752 11/13/19 0609 11/15/19 0920 11/16/19 1107  WBC 8.3 12.8* 19.2* 10.0  HGB 9.7* 10.1* 11.6* 9.6*  HCT 34.6* 36.8 42.6 34.9*  MCV 94.0 93.6 95.7 93.6  PLT 399 249 355 140*    Cardiac Enzymes: No results for input(s): CKTOTAL, CKMB, CKMBINDEX, TROPONINI in the last 168 hours.  BNP (last 3 results) Recent Labs    08/13/19 0225 09/05/19 0735 10/10/19 1120  BNP 929.4* 2,477.5* 973.2*    ProBNP (last 3 results) No results for input(s): PROBNP in the last 8760 hours.  Radiological Exams: No results found.  Assessment/Plan Active Problems:   Acute combined systolic and diastolic heart failure (HCC)   Acute on chronic respiratory failure with hypoxia (HCC)   COPD, severe (HCC)   Intracerebral hemorrhage   Tracheal hemorrhage (HCC)   1. Acute on chronic respiratory failure with hypoxia plan is to continue with full support on the ventilator until the withdrawal of life support orders are written this should be later on today 2. severe COPD no change continue supportive care 3. intracerebral hemorrhage patient has had no improvement since the bleed 4. tracheal hemorrhage supportive care we will continue to monitor   I have personally seen and evaluated the patient, evaluated laboratory and imaging results, formulated the assessment and plan and placed orders. The Patient requires high complexity decision making with  multiple systems involvement.  Rounds were done with the Respiratory Therapy Director and Staff therapists and discussed with nursing staff also.  Allyne Gee, MD [facility] Pulmonary Critical Care Medicine Sleep Medicine

## 2019-12-08 DEATH — deceased

## 2020-03-13 IMAGING — CT CT ANGIO CHEST
2 of 6 series · 18 of 36 positions shown · IV contrast (omnipaque)
Comparison: None.

CLINICAL DATA: Shortness of breath

EXAM:
CT ANGIOGRAPHY CHEST WITH CONTRAST
TECHNIQUE: Multidetector CT imaging of the chest was performed using the
standard protocol during bolus administration of intravenous
contrast. Multiplanar CT image reconstructions and MIPs were
obtained to evaluate the vascular anatomy.
CONTRAST:  75mL OMNIPAQUE IOHEXOL 350 MG/ML SOLN

[Series 6: pe thins · axial · 0.77mm/px · z∈[-302,-47]mm · 17 of 407 slices shown]
[im 21/407  lung]
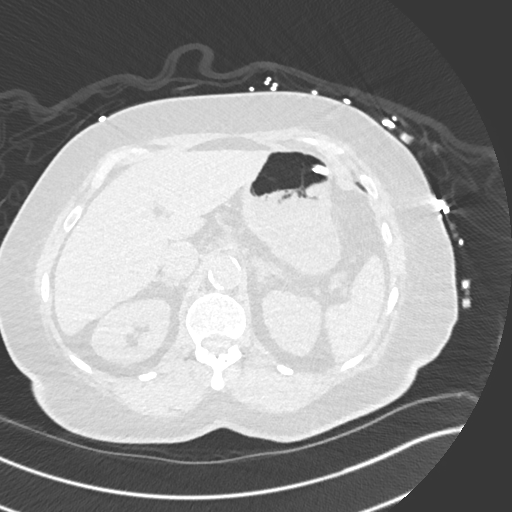
[im 41/407  mediastinal]
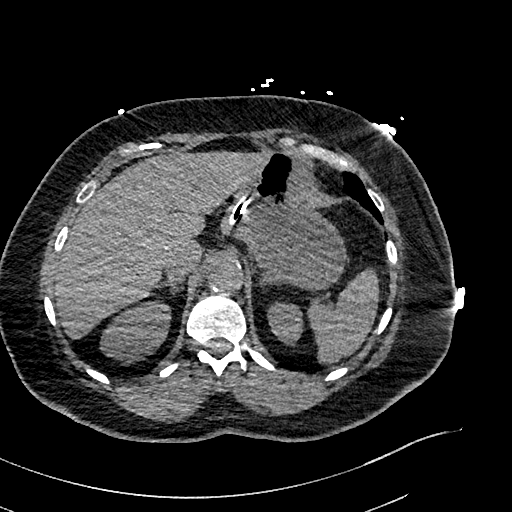
[im 61/407  lung]
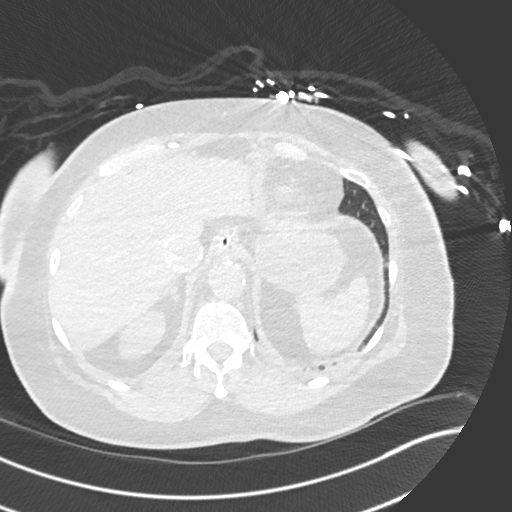
[im 82/407  mediastinal]
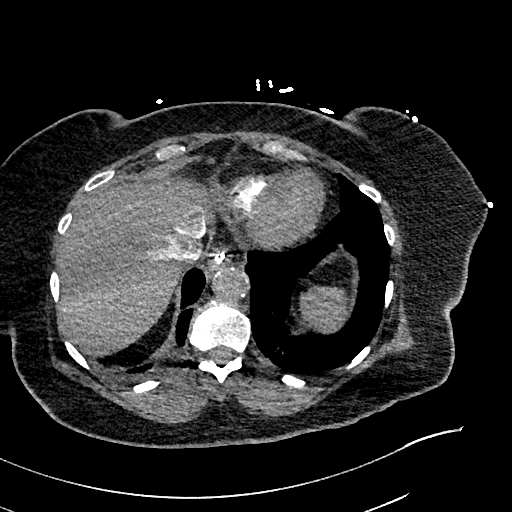
[im 122/407  lung]
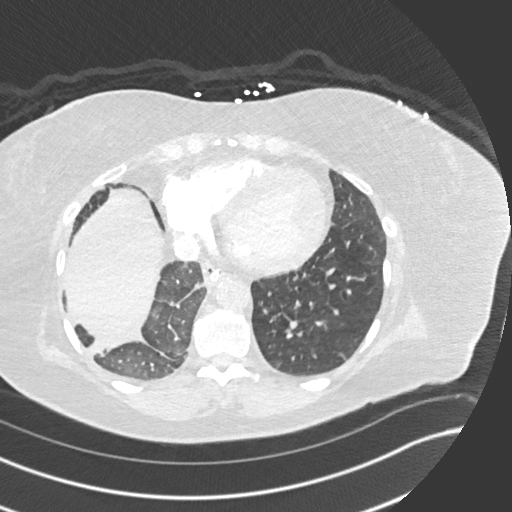
[im 143/407  mediastinal]
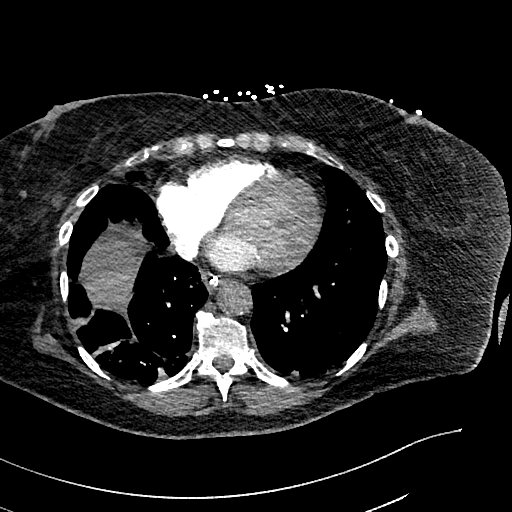
[im 163/407  lung]
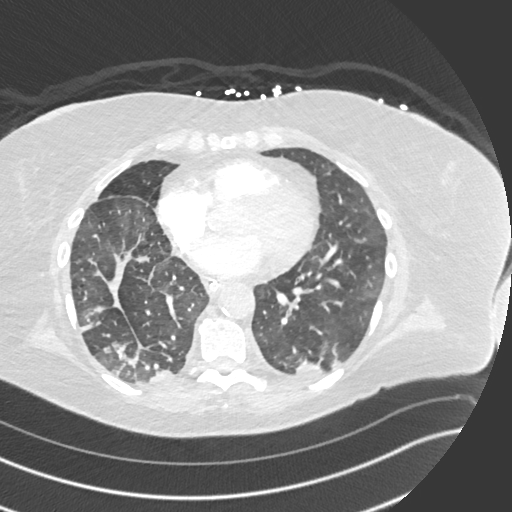
[im 183/407  mediastinal]
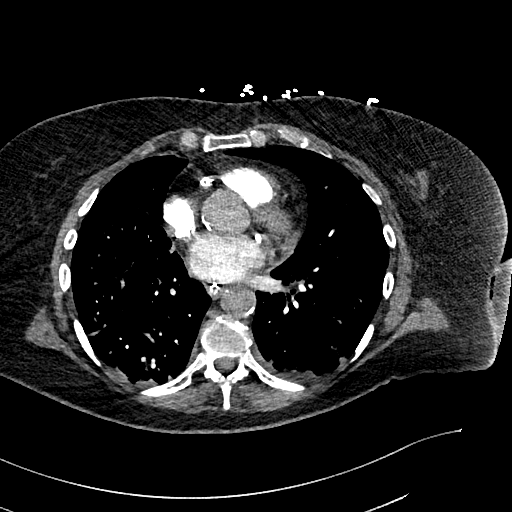
[im 204/407  lung]
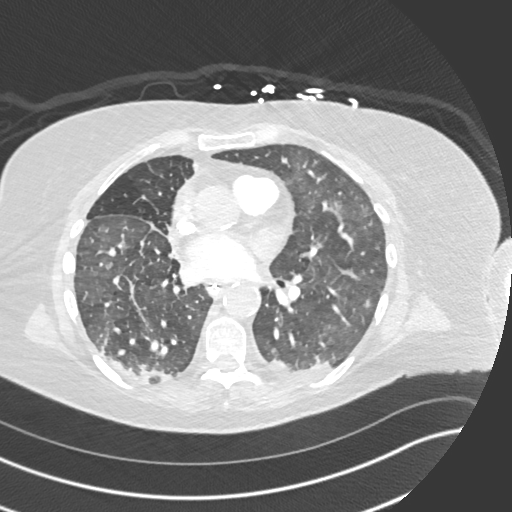
[im 224/407  mediastinal]
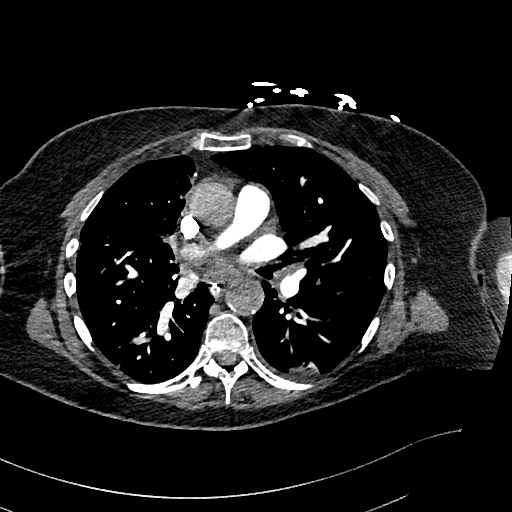
[im 244/407  lung]
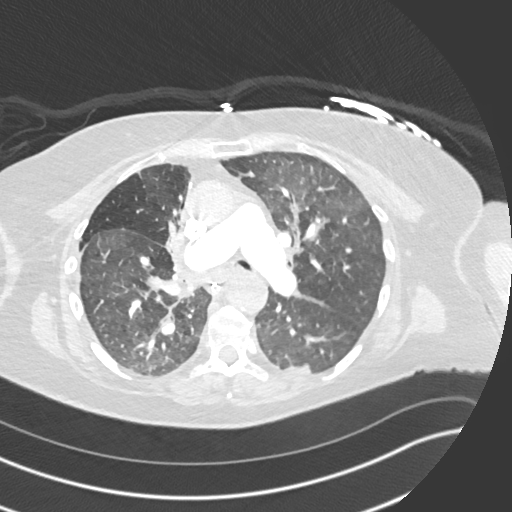
[im 264/407  mediastinal]
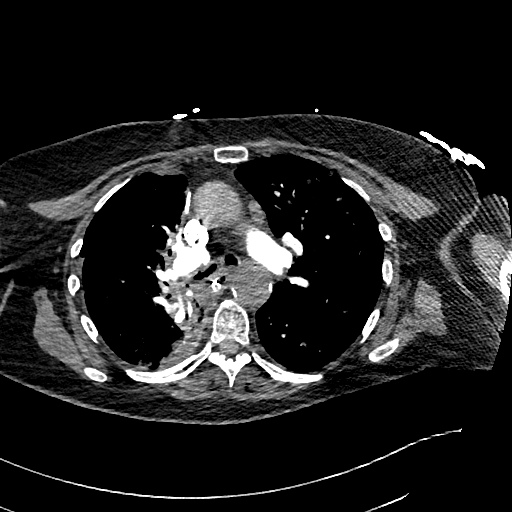
[im 285/407  lung]
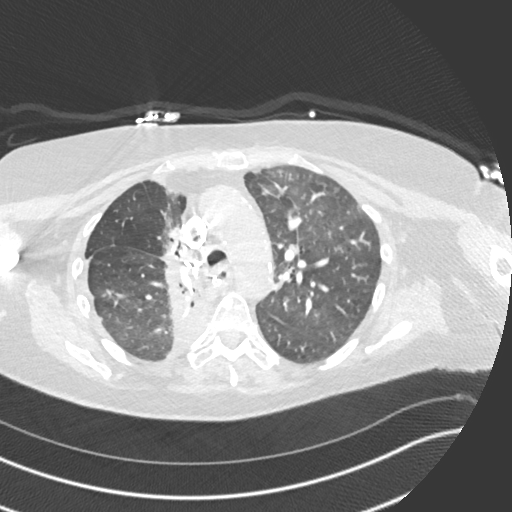
[im 325/407  mediastinal]
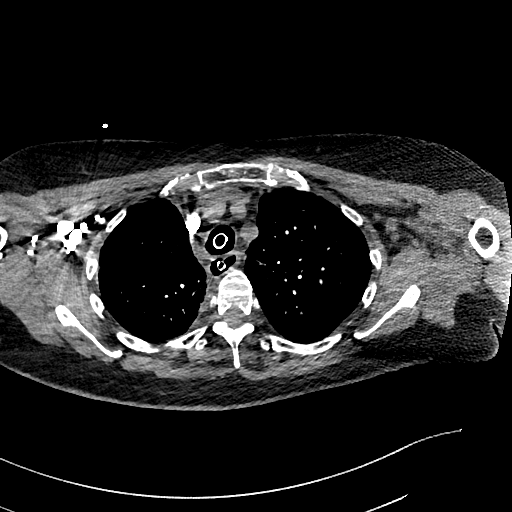
[im 346/407  lung]
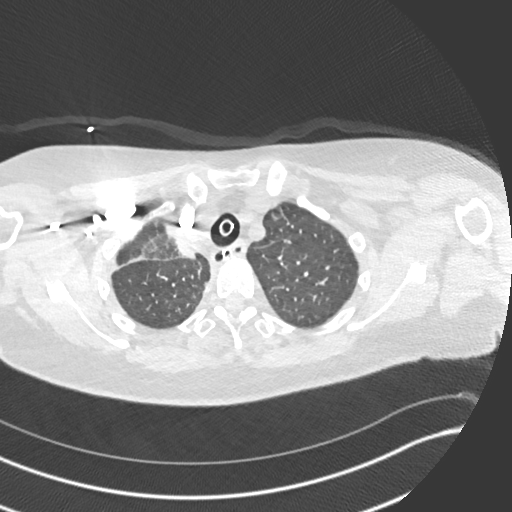
[im 366/407  mediastinal]
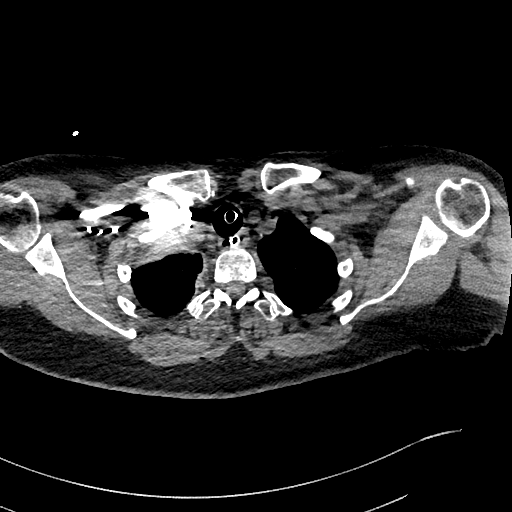
[im 386/407  lung]
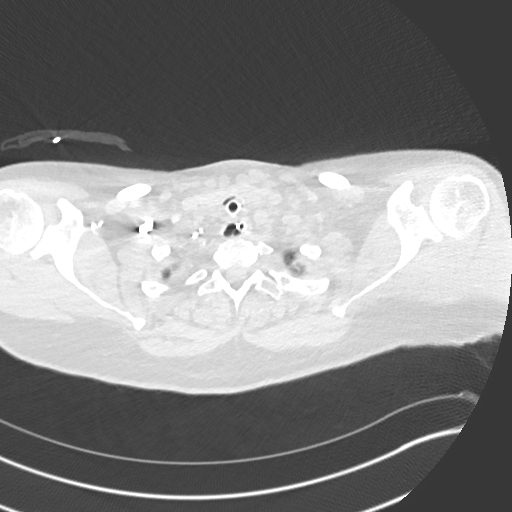

[Series 8: pe 2mm cor · coronal · 0.55mm/px · 1 of 134 slices shown]
[im 67/134  mediastinal]
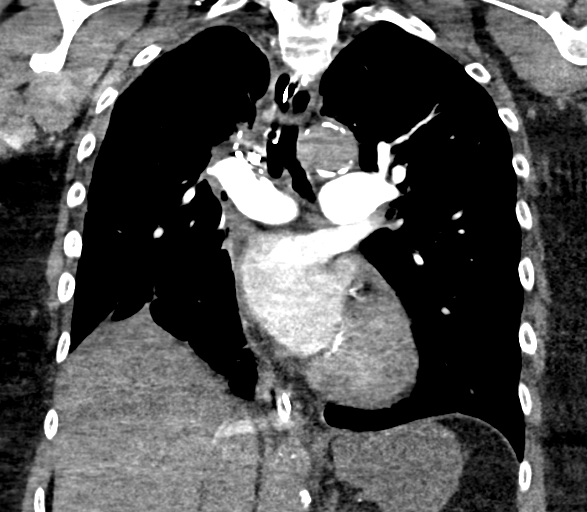

[18 of 36 positions shown; findings below may reference images not displayed]

FINDINGS: Cardiovascular:

--Pulmonary arteries: Contrast injection is sufficient to
demonstrate satisfactory opacification of the pulmonary arteries to
the segmental level, with attenuation of at least 200 HU at the main
pulmonary artery. There is no pulmonary embolus. The main pulmonary
artery is within normal limits for size.

--Aorta: Limited opacification of the aorta due to bolus timing
optimization for the pulmonary arteries. The course and caliber of
the aorta are normal. Conventional 3 vessel aortic branching
pattern. There is moderate aortic atherosclerosis.

--Heart: Normal size. No pericardial effusion. There are coronary
artery calcifications.

Mediastinum/Nodes: No mediastinal, hilar or axillary
lymphadenopathy. The visualized thyroid and thoracic esophageal
course are unremarkable.

Lungs/Pleura: There is moderate pulmonary edema with bilateral
dependent atelectasis. There is scarring along the medial right
hemithorax that has the appearance of radiation fibrosis. There is
an 8 mm right middle lobe nodule and an 11 mm left lower lobe
nodule. Both of these may be inflammatory.

Upper Abdomen: Contrast bolus timing is not optimized for evaluation
of the abdominal organs. The visualized portions of the organs of
the upper abdomen are normal.

Musculoskeletal: No chest wall abnormality. No bony spinal canal
stenosis.

Review of the MIP images confirms the above findings.
IMPRESSION: 1. No pulmonary embolus.
2. Moderate pulmonary edema and bilateral dependent atelectasis.
3. Postradiation fibrosis of the medial right hemithorax.
4. Coronary artery and aortic Atherosclerosis ([id number]).

## 2020-05-07 NOTE — Procedures (Signed)
This is late addendum to Bronchoscopy procedure performed on 10/26/19  Bronchoscopy Procedure Note  Chelsea Allison  [id number]  1955/07/16  Date: 10/26/19 Time: 7:04 AM  Provider Performing:Cianni Manny Rodman Pickle   Procedure(s):  Initial Therapeutic Aspiration of Tracheobronchial Tree [id number])  Indication(s) Hemoptysis  Consent Unable to obtain consent due to emergent nature of procedure.  Anesthesia Fentanyl   Time Out Verified patient identification, verified procedure, site/side was marked, verified correct patient position, special equipment/implants available, medications/allergies/relevant history reviewed, required imaging and test results available.   Sterile Technique Usual hand hygiene, masks, gowns, and gloves were used   Procedure Description Bronchoscope advanced through endotracheal tube and into airway.  Airways were examined down to subsegmental level with findings noted below.   Following diagnostic evaluation, bloody and frothy secretions and clots and mucous plugs throughout. Large left blood clot in left mainstem. No obvious source of bleed  Complications/Tolerance None; patient tolerated the procedure well. Chest X-ray reviewed  EBL Minimal   Specimen(s) None  ADDENDUM: This is late addendum to clarify Bronchoscopy procedure performed on 10/26/19. Bronchoscopy procedure description noted above. Blood clots and bloody secretions seen were aspirated and suctioned. No obvious source of bleeding was noted.  This was also documented in my attestation in the H&P: "I intubated patient from above trach site and performed bronchoscopy which showed thick blood clots throughout airways including largest clot burden in left mainstem. Clots and frothy pink sputum was suctioned however difficult to clear due to limited visibility from secretions. No active bleeding noted. Oxygen saturations recovered however remained in the 70-80s range. Patient was transported to Unit 55M  with PCCM physician, NP and staff. Repeat CXR demonstrated resolution of left-sided atelectasis and possible right pleural effusion vs atelectasis."  Rodman Pickle, M.D. [facility] Pulmonary/Critical Care Medicine 04/20/2020 12:09 PM
